# Patient Record
Sex: Female | Born: 1970 | Race: White | Hispanic: No | Marital: Married | Smoking: Never smoker
Health system: Southern US, Community
[De-identification: ages and names within clinical notes are randomized; demographics above are authoritative.]

## PROBLEM LIST (undated history)

## (undated) ENCOUNTER — Emergency Department (HOSPITAL_COMMUNITY): Admission: EM | Payer: Commercial Managed Care - PPO | Source: Home / Self Care

## (undated) DIAGNOSIS — E559 Vitamin D deficiency, unspecified: Secondary | ICD-10-CM

## (undated) DIAGNOSIS — E079 Disorder of thyroid, unspecified: Secondary | ICD-10-CM

## (undated) DIAGNOSIS — K219 Gastro-esophageal reflux disease without esophagitis: Secondary | ICD-10-CM

## (undated) DIAGNOSIS — D649 Anemia, unspecified: Secondary | ICD-10-CM

## (undated) DIAGNOSIS — C801 Malignant (primary) neoplasm, unspecified: Secondary | ICD-10-CM

## (undated) DIAGNOSIS — F419 Anxiety disorder, unspecified: Secondary | ICD-10-CM

## (undated) DIAGNOSIS — K829 Disease of gallbladder, unspecified: Secondary | ICD-10-CM

## (undated) DIAGNOSIS — M2550 Pain in unspecified joint: Secondary | ICD-10-CM

## (undated) DIAGNOSIS — R0602 Shortness of breath: Secondary | ICD-10-CM

## (undated) DIAGNOSIS — M1990 Unspecified osteoarthritis, unspecified site: Secondary | ICD-10-CM

## (undated) DIAGNOSIS — Z8719 Personal history of other diseases of the digestive system: Secondary | ICD-10-CM

## (undated) DIAGNOSIS — Z8489 Family history of other specified conditions: Secondary | ICD-10-CM

## (undated) DIAGNOSIS — M549 Dorsalgia, unspecified: Secondary | ICD-10-CM

## (undated) DIAGNOSIS — F32A Depression, unspecified: Secondary | ICD-10-CM

## (undated) DIAGNOSIS — E039 Hypothyroidism, unspecified: Secondary | ICD-10-CM

## (undated) DIAGNOSIS — R002 Palpitations: Secondary | ICD-10-CM

## (undated) DIAGNOSIS — E538 Deficiency of other specified B group vitamins: Secondary | ICD-10-CM

## (undated) DIAGNOSIS — F329 Major depressive disorder, single episode, unspecified: Secondary | ICD-10-CM

## (undated) DIAGNOSIS — M25569 Pain in unspecified knee: Secondary | ICD-10-CM

## (undated) DIAGNOSIS — I10 Essential (primary) hypertension: Secondary | ICD-10-CM

## (undated) DIAGNOSIS — D510 Vitamin B12 deficiency anemia due to intrinsic factor deficiency: Secondary | ICD-10-CM

## (undated) DIAGNOSIS — K469 Unspecified abdominal hernia without obstruction or gangrene: Secondary | ICD-10-CM

## (undated) HISTORY — DX: Gastro-esophageal reflux disease without esophagitis: K21.9

## (undated) HISTORY — DX: Deficiency of other specified B group vitamins: E53.8

## (undated) HISTORY — DX: Palpitations: R00.2

## (undated) HISTORY — DX: Depression, unspecified: F32.A

## (undated) HISTORY — DX: Vitamin D deficiency, unspecified: E55.9

## (undated) HISTORY — DX: Pain in unspecified joint: M25.50

## (undated) HISTORY — DX: Dorsalgia, unspecified: M54.9

## (undated) HISTORY — DX: Disorder of thyroid, unspecified: E07.9

## (undated) HISTORY — DX: Anemia, unspecified: D64.9

## (undated) HISTORY — DX: Pain in unspecified knee: M25.569

## (undated) HISTORY — DX: Vitamin B12 deficiency anemia due to intrinsic factor deficiency: D51.0

## (undated) HISTORY — DX: Anxiety disorder, unspecified: F41.9

## (undated) HISTORY — DX: Hypothyroidism, unspecified: E03.9

## (undated) HISTORY — PX: OTHER SURGICAL HISTORY: SHX169

## (undated) HISTORY — DX: Shortness of breath: R06.02

## (undated) HISTORY — DX: Disease of gallbladder, unspecified: K82.9

## (undated) HISTORY — DX: Major depressive disorder, single episode, unspecified: F32.9

## (undated) HISTORY — DX: Essential (primary) hypertension: I10

---

## 2006-10-05 ENCOUNTER — Ambulatory Visit (HOSPITAL_COMMUNITY): Admission: RE | Admit: 2006-10-05 | Discharge: 2006-10-05 | Payer: Self-pay | Admitting: Family Medicine

## 2011-02-12 DIAGNOSIS — D649 Anemia, unspecified: Secondary | ICD-10-CM | POA: Insufficient documentation

## 2011-02-12 DIAGNOSIS — F341 Dysthymic disorder: Secondary | ICD-10-CM | POA: Insufficient documentation

## 2011-02-12 DIAGNOSIS — G43909 Migraine, unspecified, not intractable, without status migrainosus: Secondary | ICD-10-CM | POA: Insufficient documentation

## 2011-02-12 DIAGNOSIS — H9319 Tinnitus, unspecified ear: Secondary | ICD-10-CM | POA: Insufficient documentation

## 2011-02-12 DIAGNOSIS — F429 Obsessive-compulsive disorder, unspecified: Secondary | ICD-10-CM | POA: Insufficient documentation

## 2012-10-09 ENCOUNTER — Encounter: Payer: Self-pay | Admitting: Family Medicine

## 2012-10-09 ENCOUNTER — Ambulatory Visit (INDEPENDENT_AMBULATORY_CARE_PROVIDER_SITE_OTHER): Payer: BC Managed Care – PPO | Admitting: Family Medicine

## 2012-10-09 VITALS — BP 124/81 | HR 77 | Temp 97.4°F | Ht 67.5 in | Wt 292.0 lb

## 2012-10-09 DIAGNOSIS — F32A Depression, unspecified: Secondary | ICD-10-CM

## 2012-10-09 DIAGNOSIS — F329 Major depressive disorder, single episode, unspecified: Secondary | ICD-10-CM

## 2012-10-09 DIAGNOSIS — D649 Anemia, unspecified: Secondary | ICD-10-CM

## 2012-10-09 DIAGNOSIS — E039 Hypothyroidism, unspecified: Secondary | ICD-10-CM

## 2012-10-09 DIAGNOSIS — E538 Deficiency of other specified B group vitamins: Secondary | ICD-10-CM

## 2012-10-09 DIAGNOSIS — E559 Vitamin D deficiency, unspecified: Secondary | ICD-10-CM

## 2012-10-09 DIAGNOSIS — M545 Low back pain, unspecified: Secondary | ICD-10-CM

## 2012-10-09 DIAGNOSIS — E669 Obesity, unspecified: Secondary | ICD-10-CM

## 2012-10-09 DIAGNOSIS — E785 Hyperlipidemia, unspecified: Secondary | ICD-10-CM

## 2012-10-09 LAB — THYROID PANEL WITH TSH
Free Thyroxine Index: 2.4 (ref 1.0–3.9)
TSH: 1.941 u[IU]/mL (ref 0.350–4.500)

## 2012-10-09 LAB — IRON AND TIBC
Iron: 29 ug/dL — ABNORMAL LOW (ref 42–145)
UIBC: 331 ug/dL (ref 125–400)

## 2012-10-09 LAB — POCT CBC
Granulocyte percent: 67.5 %G (ref 37–80)
HCT, POC: 38.7 % (ref 37.7–47.9)
MCH, POC: 26.8 pg — AB (ref 27–31.2)
MCV: 77.4 fL — AB (ref 80–97)
RDW, POC: 13.9 %
WBC: 10 10*3/uL (ref 4.6–10.2)

## 2012-10-09 LAB — VITAMIN B12: Vitamin B-12: 678 pg/mL (ref 211–911)

## 2012-10-09 LAB — HEPATIC FUNCTION PANEL: Bilirubin, Direct: <0.1 mg/dL (ref 0.0–0.3)

## 2012-10-09 LAB — BASIC METABOLIC PANEL WITH GFR
BUN: 12 mg/dL (ref 6–23)
Chloride: 103 mEq/L (ref 96–112)
Creat: 0.74 mg/dL (ref 0.50–1.10)
GFR, Est African American: >89 mL/min
Glucose, Bld: 84 mg/dL (ref 70–99)
Potassium: 4.1 mEq/L (ref 3.5–5.3)

## 2012-10-09 NOTE — Patient Instructions (Signed)
Continue aggressive therapeutic lifestyle changes and current medications We will call you with lab work is returned

## 2012-10-09 NOTE — Progress Notes (Signed)
  Subjective:    Patient ID: Diamond Marshall, female    DOB: 09-13-1970, 42 y.o.   MRN: [id number]  HPI Patient notes that she has lost 5 pounds of weight based on our scales. Based on her scales at home she says it's about 10 pounds. She has been on a noted junk food diet for about 20 day, is basically a low carb. See copies of notes from a hematologist in 3-4 years ago in [place]. Increased right knee pain for about a week, no history of any injury,it may be getting some better.   Review of Systems  Constitutional: Positive for fatigue.  HENT: Negative.   Eyes: Negative.   Respiratory: Negative for shortness of breath.   Cardiovascular: Negative.   Gastrointestinal: Negative.   Genitourinary: Negative.   Musculoskeletal: Positive for arthralgias (R knee).  Neurological: Negative.   Psychiatric/Behavioral: The patient is nervous/anxious (slight).        Objective:   Physical Exam BP 124/81  Pulse 77  Temp(Src) 97.4 F (36.3 C) (Oral)  Ht 5' 7.5" (1.715 m)  Wt 292 lb (132.45 kg)  BMI 45.03 kg/m2  The patient appeared well nourished and normally developed, alert and oriented to time and place. Speech, behavior and judgement appear normal. Vital signs as documented.  Head exam is unremarkable. No scleral icterus or pallor noted.  Neck is without jugular venous distension, thyromegally, or carotid bruits. Carotid upstrokes are brisk bilaterally. No cervical adenopathy. Lungs are clear anteriorly and posteriorly to auscultation. Normal respiratory effort. Cardiac exam reveals regular rate and rhythm at 72 per min. First and second heart sounds normal.  No murmurs, rubs or gallops.  Abdominal exam reveals obesity, normal bowl sounds, no masses, no organomegaly and no aortic enlargement.  Extremities are nonedematous and both  pedal pulses are normal. There is tenderness at the right lateral joint line of the right knee. Skin without pallor or jaundice.  Warm and dry, without  rash. Neurologic exam reveals normal deep tendon reflexes and normal sensation.          Assessment & Plan:  1. Anemia  2.  hyperlipidemia  3. anxiety  4. Obesity  5. Hypothyroidism  6. B12 deficiency  7. Low back pain   Lab work to be drawn today  Patient Instructions  Continue aggressive therapeutic lifestyle changes and current medications We will call you with lab work is returned

## 2012-10-10 LAB — VITAMIN D 25 HYDROXY (VIT D DEFICIENCY, FRACTURES): Vit D, 25-Hydroxy: 30 ng/mL (ref 30–89)

## 2012-10-11 LAB — NMR LIPOPROFILE WITH LIPIDS
Cholesterol, Total: 158 mg/dL (ref <200)
HDL Particle Number: 44 umol/L (ref >=30.5)
HDL-C: 42 mg/dL (ref >=40)
LDL (calc): 72 mg/dL (ref <100)
LP-IR Score: 70 — ABNORMAL HIGH (ref <=45)
Triglycerides: 221 mg/dL — ABNORMAL HIGH (ref <150)

## 2012-10-15 ENCOUNTER — Telehealth: Payer: Self-pay | Admitting: *Deleted

## 2012-10-15 NOTE — Telephone Encounter (Signed)
Pt notified of lab results Will call back to schedule appt with Marcelino Duster

## 2012-11-01 ENCOUNTER — Other Ambulatory Visit: Payer: Self-pay | Admitting: Family Medicine

## 2012-12-04 ENCOUNTER — Other Ambulatory Visit: Payer: Self-pay

## 2012-12-04 MED ORDER — LORAZEPAM 0.5 MG PO TABS: 0.5000 mg | ORAL_TABLET | Freq: Two times a day (BID) | ORAL | Status: DC | PRN

## 2012-12-04 NOTE — Telephone Encounter (Signed)
She should only take this if needed and not take it regularly. We will refill it  one time.

## 2012-12-04 NOTE — Telephone Encounter (Signed)
Last seen 10/09/12  DWm  If approved route to nurse to call in and notify patient

## 2012-12-06 NOTE — Telephone Encounter (Signed)
CALLED IN 12/06/12

## 2013-03-27 ENCOUNTER — Other Ambulatory Visit: Payer: Self-pay

## 2013-05-07 ENCOUNTER — Ambulatory Visit: Payer: BC Managed Care – PPO | Admitting: General Practice

## 2013-06-19 ENCOUNTER — Encounter: Payer: BC Managed Care – PPO | Admitting: Family Medicine

## 2013-07-24 ENCOUNTER — Encounter: Payer: Self-pay | Admitting: Family Medicine

## 2013-07-24 ENCOUNTER — Ambulatory Visit (INDEPENDENT_AMBULATORY_CARE_PROVIDER_SITE_OTHER): Payer: BC Managed Care – PPO

## 2013-07-24 ENCOUNTER — Ambulatory Visit (INDEPENDENT_AMBULATORY_CARE_PROVIDER_SITE_OTHER): Payer: BC Managed Care – PPO | Admitting: Family Medicine

## 2013-07-24 VITALS — BP 124/76 | HR 96 | Temp 98.5°F | Ht 67.5 in | Wt 294.0 lb

## 2013-07-24 DIAGNOSIS — Z0000 Encounter for general adult medical examination without abnormal findings: Secondary | ICD-10-CM

## 2013-07-24 DIAGNOSIS — F329 Major depressive disorder, single episode, unspecified: Secondary | ICD-10-CM

## 2013-07-24 DIAGNOSIS — F32A Depression, unspecified: Secondary | ICD-10-CM

## 2013-07-24 DIAGNOSIS — F988 Other specified behavioral and emotional disorders with onset usually occurring in childhood and adolescence: Secondary | ICD-10-CM | POA: Insufficient documentation

## 2013-07-24 DIAGNOSIS — E039 Hypothyroidism, unspecified: Secondary | ICD-10-CM

## 2013-07-24 DIAGNOSIS — F3289 Other specified depressive episodes: Secondary | ICD-10-CM

## 2013-07-24 DIAGNOSIS — D649 Anemia, unspecified: Secondary | ICD-10-CM

## 2013-07-24 MED ORDER — ESCITALOPRAM OXALATE 10 MG PO TABS: 10.0000 mg | ORAL_TABLET | Freq: Every day | ORAL | Status: DC

## 2013-07-24 MED ORDER — CYANOCOBALAMIN 1000 MCG/ML IJ SOLN: 1000.0000 ug | INTRAMUSCULAR | Status: DC

## 2013-07-24 NOTE — Patient Instructions (Addendum)
     Schedule mammogram Continue current medications. Continue good therapeutic lifestyle changes which include good diet and exercise. Fall precautions discussed with patient. If an FOBT was given today- please return it to our front desk. If you are over 43 years old - you may need Prevnar 27 or the adult Pneumonia vaccine.  We arranged an appointment for you to see the clinical pharmacist to discuss weight loss options We will also arrange a visit with you with the nurse practitioner to consider treatment for ADD You will get a chest x-ray and an EKG today Your medications will be refilled, the Lexapro may be changed to another medication by the clinical pharmacist   Returned the FOBT,use saline nose spray and gel and a cool mist humidifier in your arm.  Keep the thermostat lower

## 2013-07-24 NOTE — Progress Notes (Signed)
Subjective:    Patient ID: Diamond Marshall, female    DOB: 1971/02/02, 43 y.o.   MRN: [id number]  HPI Follow up on pernicious anemia, anxiety, and depression. Patient also needs refills on B12 and Lexapro. She would also like to discuss problems with her attention and focus. Patient has concerns regarding her appetite issues. She indicates that it is just hard to stop wanting to eat and eat properly. She has been sick during the past 2-3 months with the flu and viral infections. She has only started feeling better over the past couple of weeks.   Review of Systems  Constitutional: Negative.   HENT: Negative.   Eyes: Negative.   Respiratory: Negative.   Cardiovascular: Negative.   Gastrointestinal: Negative.   Endocrine: Negative.   Genitourinary: Negative.   Musculoskeletal: Negative.   Skin: Negative.   Allergic/Immunologic: Negative.   Neurological: Negative.   Hematological: Negative.   Psychiatric/Behavioral: Negative.        Objective:   Physical Exam  Nursing note and vitals reviewed. Constitutional: She is oriented to person, place, and time. She appears well-developed and well-nourished. No distress.  The patient is pleasant and cooperative and expresses a desire to want to feel better and eat less.  HENT:  Head: Normocephalic and atraumatic.  Right Ear: External ear normal.  Left Ear: External ear normal.  Mouth/Throat: Oropharynx is clear and moist.  Bilateral nasal congestion  Eyes: Conjunctivae and EOM are normal. Pupils are equal, round, and reactive to light. Right eye exhibits no discharge. Left eye exhibits no discharge. No scleral icterus.  Neck: Normal range of motion. Neck supple. No thyromegaly present.  No carotid bruits  Cardiovascular: Normal rate, regular rhythm, normal heart sounds and intact distal pulses.  Exam reveals no gallop and no friction rub.   No murmur heard. At 72 per minute  Pulmonary/Chest: Effort normal and breath sounds normal. No  respiratory distress. She has no wheezes. She has no rales.  Abdominal: Soft. Bowel sounds are normal. She exhibits no mass. There is no tenderness. There is no rebound and no guarding.  Morbid obesity  Musculoskeletal: Normal range of motion. She exhibits no edema and no tenderness.  Lymphadenopathy:    She has no cervical adenopathy.  Neurological: She is alert and oriented to person, place, and time. She has normal reflexes.  Skin: Skin is warm and dry.  Psychiatric: She has a normal mood and affect. Her behavior is normal. Judgment and thought content normal.   BP 124/76  Pulse 96  Temp(Src) 98.5 F (36.9 C) (Oral)  Ht 5' 7.5" (1.715 m)  Wt 294 lb (133.358 kg)  BMI 45.34 kg/m2  EKG: No change from previous EKG  [facility] reading (PRIMARY) by  Dr. Brunilda Payor x-ray -no active disease                                        Assessment & Plan:   1. Depression - escitalopram (LEXAPRO) 10 MG tablet; Take 1 tablet (10 mg total) by mouth daily.  Dispense: 30 tablet; Refill: 5  2. Anemia - cyanocobalamin (,VITAMIN B-12,) 1000 MCG/ML injection; Inject 1 mL (1,000 mcg total) into the muscle every 30 (thirty) days.  Dispense: 6 mL; Refill: 1 - Ferritin - Anemia Profile B  3. Hypothyroid - Thyroid Panel With TSH  4. Health care maintenance - BMP8+EGFR - NMR, lipoprofile - Vit D  25 hydroxy (rtn osteoporosis monitoring) - EKG 12-Lead  5. ADD (attention deficit disorder)  Patient Instructions      Schedule mammogram Continue current medications. Continue good therapeutic lifestyle changes which include good diet and exercise. Fall precautions discussed with patient. If an FOBT was given today- please return it to our front desk. If you are over 19 years old - you may need Prevnar 69 or the adult Pneumonia vaccine.  We arranged an appointment for you to see the clinical pharmacist to discuss weight loss options We will also arrange a visit with you with the nurse  practitioner to consider treatment for ADD You will get a chest x-ray and an EKG today Your medications will be refilled, the Lexapro may be changed to another medication by the clinical pharmacist   Returned the FOBT,use saline nose spray and gel and a cool mist humidifier in your arm.  Keep the thermostat lower     Arrie Senate MD

## 2013-07-25 LAB — ANEMIA PROFILE B
BASOS: 0 %
Basophils Absolute: 0 10*3/uL (ref 0.0–0.2)
Eos: 1 %
Eosinophils Absolute: 0.1 10*3/uL (ref 0.0–0.4)
FOLATE: 13 ng/mL (ref >3.0)
Ferritin: 217 ng/mL — ABNORMAL HIGH (ref 15–150)
HEMATOCRIT: 37.9 % (ref 34.0–46.6)
HEMOGLOBIN: 12.4 g/dL (ref 11.1–15.9)
IRON SATURATION: 9 % — AB (ref 15–55)
IRON: 29 ug/dL — AB (ref 35–155)
Immature Grans (Abs): 0 10*3/uL (ref 0.0–0.1)
Immature Granulocytes: 0 %
LYMPHS ABS: 2.9 10*3/uL (ref 0.7–3.1)
LYMPHS: 29 %
MCH: 26.1 pg — ABNORMAL LOW (ref 26.6–33.0)
MCHC: 32.7 g/dL (ref 31.5–35.7)
MCV: 80 fL (ref 79–97)
MONOCYTES: 5 %
Monocytes Absolute: 0.5 10*3/uL (ref 0.1–0.9)
NEUTROS ABS: 6.5 10*3/uL (ref 1.4–7.0)
Neutrophils Relative %: 65 %
Platelets: 447 10*3/uL — ABNORMAL HIGH (ref 150–379)
RBC: 4.76 x10E6/uL (ref 3.77–5.28)
RDW: 15.7 % — ABNORMAL HIGH (ref 12.3–15.4)
RETIC CT PCT: 1.4 % (ref 0.6–2.6)
TIBC: 340 ug/dL (ref 250–450)
UIBC: 311 ug/dL (ref 150–375)
Vitamin B-12: 1126 pg/mL — ABNORMAL HIGH (ref 211–946)
WBC: 10.1 10*3/uL (ref 3.4–10.8)

## 2013-07-25 LAB — BMP8+EGFR
BUN / CREAT RATIO: 13 (ref 9–23)
BUN: 9 mg/dL (ref 6–24)
CALCIUM: 9.4 mg/dL (ref 8.7–10.2)
CHLORIDE: 99 mmol/L (ref 97–108)
CO2: 22 mmol/L (ref 18–29)
CREATININE: 0.7 mg/dL (ref 0.57–1.00)
GFR calc Af Amer: 124 mL/min/{1.73_m2} (ref >59)
GFR calc non Af Amer: 107 mL/min/{1.73_m2} (ref >59)
Glucose: 103 mg/dL — ABNORMAL HIGH (ref 65–99)
Potassium: 4.7 mmol/L (ref 3.5–5.2)
Sodium: 138 mmol/L (ref 134–144)

## 2013-07-25 LAB — NMR, LIPOPROFILE
Cholesterol: 147 mg/dL (ref <200)
HDL CHOLESTEROL BY NMR: 59 mg/dL (ref >=40)
HDL PARTICLE NUMBER: 44.4 umol/L (ref >=30.5)
LDL Particle Number: 1359 nmol/L — ABNORMAL HIGH (ref <1000)
LDL Size: 19.8 nm — ABNORMAL LOW (ref >20.5)
LDLC SERPL CALC-MCNC: 37 mg/dL (ref <100)
LP-IR SCORE: 74 — AB (ref <=45)
SMALL LDL PARTICLE NUMBER: 963 nmol/L — AB (ref <=527)
Triglycerides by NMR: 255 mg/dL — ABNORMAL HIGH (ref <150)

## 2013-07-25 LAB — THYROID PANEL WITH TSH
FREE THYROXINE INDEX: 1.7 (ref 1.2–4.9)
T3 UPTAKE RATIO: 20 % — AB (ref 24–39)
T4 TOTAL: 8.7 ug/dL (ref 4.5–12.0)
TSH: 2.64 u[IU]/mL (ref 0.450–4.500)

## 2013-07-25 LAB — VITAMIN D 25 HYDROXY (VIT D DEFICIENCY, FRACTURES): VIT D 25 HYDROXY: 17.4 ng/mL — AB (ref 30.0–100.0)

## 2013-07-31 ENCOUNTER — Other Ambulatory Visit: Payer: Self-pay | Admitting: *Deleted

## 2013-07-31 DIAGNOSIS — D649 Anemia, unspecified: Secondary | ICD-10-CM

## 2013-07-31 MED ORDER — VITAMIN D (ERGOCALCIFEROL) 1.25 MG (50000 UNIT) PO CAPS: 50000.0000 [IU] | ORAL_CAPSULE | ORAL | Status: DC

## 2013-07-31 MED ORDER — INTEGRA 62.5-62.5-40-3 MG PO CAPS: 1.0000 | ORAL_CAPSULE | Freq: Every day | ORAL | Status: DC

## 2013-08-07 ENCOUNTER — Ambulatory Visit (INDEPENDENT_AMBULATORY_CARE_PROVIDER_SITE_OTHER): Payer: BC Managed Care – PPO | Admitting: Pharmacist

## 2013-08-07 DIAGNOSIS — E669 Obesity, unspecified: Secondary | ICD-10-CM

## 2013-08-07 DIAGNOSIS — E781 Pure hyperglyceridemia: Secondary | ICD-10-CM | POA: Insufficient documentation

## 2013-08-07 DIAGNOSIS — R7309 Other abnormal glucose: Secondary | ICD-10-CM

## 2013-08-07 DIAGNOSIS — E8881 Metabolic syndrome: Secondary | ICD-10-CM

## 2013-08-07 NOTE — Progress Notes (Deleted)
   Subjective:    Patient ID: Diamond Marshall, female    DOB: 02-08-1971, 43 y.o.   MRN: [id number]  HPI    Review of Systems     Objective:   Physical Exam        Assessment & Plan:

## 2013-08-07 NOTE — Progress Notes (Signed)
Subjective:    Diamond Marshall is a 43 y.o. female who presents for evaluation of obesity. She has noted a weight gain of approximately 50 pounds over the last 5 years. There is a positive family history for obesity in daughter and husband. She feels ideal weight is 200 pounds.  History of eating disorders: none. Previous treatments for obesity include: self-directed dieting and Weight Watchers. Associated medical conditions: depression, hyperlipidemia and metabolic syndrome. Associated medications: none. Cardiovascular risk factors besides obesity: dyslipidemia, family history of premature cardiovascular disease, obesity (BMI >= 30 kg/m2) and sedentary lifestyle. Patient reports that she has symptoms of low BG at times and then she gets very hungry.    The following portions of the patient's history were reviewed and updated as appropriate: allergies, current medications, past family history, past medical history, past social history, past surgical history and problem list.  Review of Systems Constitutional: negative for anorexia, chills, fatigue, fevers, night sweats and sweats Behavioral/Psych: positive for depression, irritability and mood swings, negative for abusive relationship, aggressive behavior, decreased appetite and fatigue Endocrine: positive for elevated BG and Triglycerides although patient stated most recent labs were non fasting, negative for diabetic symptoms including blurry vision, polydipsia, polyphagia, polyuria, poor wound healing and weight loss and fertility problems    Objective:        Lab Review  Office Visit on 07/24/2013  Component Date Value  . Glucose 07/24/2013 103*  . BUN 07/24/2013 9   . Creatinine, Ser 07/24/2013 0.70   . GFR calc non Af Amer 07/24/2013 107   . GFR calc Af Amer 07/24/2013 124   . BUN/Creatinine Ratio 07/24/2013 13   . Sodium 07/24/2013 138   . Potassium 07/24/2013 4.7   . Chloride 07/24/2013 99   . CO2 07/24/2013 22   . Calcium  07/24/2013 9.4   . LDL Particle Number 07/24/2013 [id number]*  . LDLC SERPL CALC-MCNC 07/24/2013 37   . HDL Cholesterol by NMR 07/24/2013 59   . Triglycerides by NMR 07/24/2013 255*  . Cholesterol 07/24/2013 147   . HDL Particle Number 07/24/2013 44.4   . Small LDL Particle Number 07/24/2013 963*  . LDL Size 07/24/2013 19.8*  . LP-IR Score 07/24/2013 74*  . Vit D, 25-Hydroxy 07/24/2013 17.4*  . TIBC 07/24/2013 340   . UIBC 07/24/2013 311   . Iron 07/24/2013 29*  . Iron Saturation 07/24/2013 9*  . Ferritin 07/24/2013 217*  . Vitamin B-12 07/24/2013 1126*  . Folate 07/24/2013 13.0   . WBC 07/24/2013 10.1   . RBC 07/24/2013 4.76   . Hemoglobin 07/24/2013 12.4   . HCT 07/24/2013 37.9   . MCV 07/24/2013 80   . MCH 07/24/2013 26.1*  . MCHC 07/24/2013 32.7   . RDW 07/24/2013 15.7*  . Platelets 07/24/2013 447*  . Neutrophils Relative % 07/24/2013 65   . Lymphs 07/24/2013 29   . Monocytes 07/24/2013 5   . Eos 07/24/2013 1   . Basos 07/24/2013 0   . Neutrophils Absolute 07/24/2013 6.5   . Lymphocytes Absolute 07/24/2013 2.9   . Monocytes Absolute 07/24/2013 0.5   . Eosinophils Absolute 07/24/2013 0.1   . Basophils Absolute 07/24/2013 0.0   . Immature Granulocytes 07/24/2013 0   . Immature Grans (Abs) 07/24/2013 0.0   . Retic Ct Pct 07/24/2013 1.4   . TSH 07/24/2013 2.640   . T4, Total 07/24/2013 8.7   . T3 Uptake Ratio 07/24/2013 20*  . Free Thyroxine Index 07/24/2013 1.7  Assessment:    Obesity with BMI and comorbidities as noted above.  Possible insulin resistance   Plan:    1. Discussed proper diet (low fat, low sodium, high fiber) with patient.   2. Discussed need for regular exercise (3 times per week, 20 minutes per session) with patient.   3. Start metformin XR 500mg  1 tablet daily with food for 7 days, then increase to 2 tablets daily.  We discussed Contrave and other weight loss medications and patient is not interested in starting at this time. 4.  Also  discussed weight loss surgery options 5. Follow up in 4 weeks and as needed.    Cherre Robins, PharmD, CPP

## 2013-08-07 NOTE — Patient Instructions (Signed)
Metabolic Syndrome, Adult Metabolic syndrome descibes a group of risk factors for heart disease and diabetes. This syndrome has other names including Insulin Resistance Syndrome. The more risk factors you have, the higher your risk of having a heart attack, stroke, or developing diabetes. These risk factors include:  High blood sugar.  High blood triglyceride (a fat found in the blood) level.  High blood pressure.  Abdominal obesity (your extra weight is around your waist instead of your hips).  Low levels of high-density lipoprotein, HDL (good blood cholesterol). If you have any three of these risk factors, you have metabolic syndrome. If you have even one of these factors, you should make lifestyle changes to improve your health in order to prevent serious health diseases.  In people with metabolic syndrome, the cells do not respond properly to insulin. This can lead to high levels of glucose in the blood, which can interfere with normal body processes. Eventually, this can cause high blood pressure and higher fat levels in the blood, and inflammation of your blood vessels. The result can be heart disease and stroke.  CAUSES   Eating a diet rich in calories and saturated fat.  Too little physical activity.  Being overweight. Other underlying causes are:  Family history (genetics).  Ethnicity (South Asians are at a higher risk).  Older age (your chances of developing metabolic syndrome are higher as you grow older).  Insulin resistance. SYMPTOMS  By itself, metabolic syndrome has no symptoms. However, you might have symptoms of diabetes (high blood sugar) or high blood pressure, such as:  Increased thirst, urination, and tiredness.  Dizzy spells.  Dull headaches that are unusual for you.  Blurred vision.  Nosebleeds. DIAGNOSIS  Your caregiver may make a diagnosis of metabolic syndrome if you have at least three of these factors:  If you are overweight mostly around the  waist. This means a waistline greater than 40" in men and more than 35" in women. The waistline limits are 31 to 35 inches for women and 37 to 39 inches for men. In those who have certain genetic risk factors, such as having a family history of diabetes or being of Asian descent.  If you have a blood pressure of 130/85 mm Hg or more, or if you are being treated for high blood pressure.  If your blood triglyceride level is 150 mg/dL or more, or you are being treated for high levels of triglyceride.  If the level of HDL in your blood is below 40 mg/dL in men, less than 50 mg/dL in women, or you are receiving treatment for low levels of HDL.  If the level of sugar in your blood is high with fasting blood sugar level of 110 mg/dL or more, or you are under treatment for diabetes. TREATMENT  Your caregiver may have you make lifestyle changes, which may include:  Exercise.  Losing weight.  Maintaining a healthy diet.  Quitting smoking. The lifestyle changes listed above are key in reducing your risk for heart disease and stroke. Medicines may also be prescribed to help your body respond to insulin better and to reduce your blood pressure and blood fat levels. Aspirin may be recommended to reduce risks of heart disease or stroke.  HOME CARE INSTRUCTIONS   Exercise.  Measure your waist at regular intervals just above the hipbones after you have breathed out.  Maintain a healthy diet.  Eat fruits, such as apples, oranges, and pears.  Eat vegetables.  Eat legumes, such as kidney  beans, peas, and lentils.  Eat food rich in soluble fiber, such as whole grain cereal, oatmeal, and oat bran.  Use olive or safflower oils and avoid saturated fats.  Eat nuts.  Limit the amount of salt you eat or add to food.  Limit the amount of alcohol you drink.  Include fish in your diet, if possible.  Stop smoking if you are a smoker.  Maintain regular follow-up appointments.  Follow your  caregiver's advice. SEEK MEDICAL CARE IF:   You feel very tired or fatigued.  You develop excessive thirst.  You pass large quantities of urine.  You are putting on weight around your waist rather than losing weight.  You develop headaches over and over again.  You have off-and-on dizzy spells. SEEK IMMEDIATE MEDICAL CARE IF:   You develop nosebleeds.  You develop sudden blurred vision.  You develop sudden dizzy spells.  You develop chest pains, trouble breathing, or feel an abnormal or irregular heart beat.  You have a fainting episode.  You develop any sudden trouble speaking and/or swallowing.  You develop sudden weakness in one arm and/or one leg. MAKE SURE YOU:   Understand these instructions.  Will watch your condition.  Will get help right away if you are not doing well or get worse. Document Released: 08/15/2007 Document Revised: 07/31/2011 Document Reviewed: 08/15/2007 [facility] Patient Information 2014 [place], [place].

## 2013-08-18 ENCOUNTER — Telehealth: Payer: Self-pay | Admitting: Nurse Practitioner

## 2013-08-18 NOTE — Telephone Encounter (Signed)
Called patient back - asked about recent diet and she reported over the weekend eating out more than usual.  I think swelling / fluid retention might be related to increased salt intake.  Patient to continue metformin and let me know if swelling continues.

## 2013-08-20 ENCOUNTER — Telehealth: Payer: Self-pay | Admitting: Pharmacist

## 2013-08-21 NOTE — Telephone Encounter (Signed)
Since metformin is XR is to very common to find the shell or a ghost tablet.  You are still absorbing medication.

## 2013-08-27 ENCOUNTER — Ambulatory Visit: Payer: BC Managed Care – PPO | Admitting: Nurse Practitioner

## 2013-09-01 ENCOUNTER — Ambulatory Visit: Payer: BC Managed Care – PPO | Admitting: Nurse Practitioner

## 2013-09-17 ENCOUNTER — Encounter: Payer: Self-pay | Admitting: *Deleted

## 2013-09-29 ENCOUNTER — Other Ambulatory Visit: Payer: Self-pay | Admitting: Family Medicine

## 2013-10-01 ENCOUNTER — Ambulatory Visit: Payer: BC Managed Care – PPO | Admitting: Nurse Practitioner

## 2013-10-02 ENCOUNTER — Ambulatory Visit: Payer: BC Managed Care – PPO | Admitting: Nurse Practitioner

## 2013-10-20 ENCOUNTER — Telehealth: Payer: Self-pay | Admitting: Pharmacist

## 2013-10-22 NOTE — Telephone Encounter (Signed)
Recommend discontinue metformin.  Patient's daughter was just diagnosed with PCOS and the family has changed diet.  Patient wants to try diet for 4-6 weeks and then will come in for labs.

## 2013-11-17 ENCOUNTER — Telehealth: Payer: Self-pay | Admitting: Family Medicine

## 2013-11-17 NOTE — Telephone Encounter (Signed)
Spoke with patient and she has rinsed her eye and used visine. Patient notified to watch it and if it starts to get red, swelling or irritated to please call office

## 2013-11-27 ENCOUNTER — Telehealth: Payer: Self-pay | Admitting: Family Medicine

## 2013-11-27 NOTE — Telephone Encounter (Signed)
The heart rate is supposed to rise with exercise that is normal. Gait she had any chest discomfort or shortness of breath?

## 2013-11-27 NOTE — Telephone Encounter (Signed)
Patient aware. States no sob or chest pain

## 2014-02-19 ENCOUNTER — Other Ambulatory Visit: Payer: Self-pay | Admitting: Family Medicine

## 2014-03-06 ENCOUNTER — Other Ambulatory Visit: Payer: Self-pay

## 2014-05-18 ENCOUNTER — Encounter: Payer: Self-pay | Admitting: Physician Assistant

## 2014-05-18 ENCOUNTER — Ambulatory Visit (INDEPENDENT_AMBULATORY_CARE_PROVIDER_SITE_OTHER): Payer: BC Managed Care – PPO | Admitting: Physician Assistant

## 2014-05-18 VITALS — BP 147/97 | HR 92 | Temp 98.4°F | Ht 67.5 in | Wt 292.2 lb

## 2014-05-18 DIAGNOSIS — M25511 Pain in right shoulder: Secondary | ICD-10-CM

## 2014-05-18 MED ORDER — MELOXICAM 15 MG PO TABS: 15.0000 mg | ORAL_TABLET | Freq: Every day | ORAL | Status: DC

## 2014-05-18 NOTE — Progress Notes (Signed)
Subjective:     Patient ID: Diamond Marshall, female   DOB: [birth year], 43 y.o.   MRN: [id number]  HPI Pt with R shoulder pain x 1 week She denies any injury to the shoulder Sx will radiate to the mid bicep area No OTC meds for sx No hx of same  Review of Systems     Objective:   Physical Exam NAD No ecchy/edema/rashes to the R shoulder +TTP of the superior and lateral shoulder No TTP of the ant/post shoulder No sx with internal/ext rotation + Sx with rest ant and lateral abduction Good strength distal Good grip strength Sensory intact    Assessment:     Right Shoulder pain    Plan:     Heat/Ice Mobic 15mg  daily x 2 weeks Hold any OTC NSAIDS ROM exercises reviewed If sx cont f/u for Xray and injection

## 2014-05-18 NOTE — Patient Instructions (Signed)

## 2014-06-17 ENCOUNTER — Telehealth: Payer: Self-pay | Admitting: Family Medicine

## 2014-06-17 DIAGNOSIS — D508 Other iron deficiency anemias: Secondary | ICD-10-CM

## 2014-06-17 DIAGNOSIS — E785 Hyperlipidemia, unspecified: Secondary | ICD-10-CM

## 2014-06-17 DIAGNOSIS — E559 Vitamin D deficiency, unspecified: Secondary | ICD-10-CM

## 2014-06-17 NOTE — Telephone Encounter (Signed)
Order entered in [place]  Pt notified

## 2014-06-18 ENCOUNTER — Other Ambulatory Visit: Payer: Self-pay | Admitting: *Deleted

## 2014-06-18 ENCOUNTER — Telehealth: Payer: Self-pay | Admitting: Family Medicine

## 2014-06-18 DIAGNOSIS — D649 Anemia, unspecified: Secondary | ICD-10-CM

## 2014-06-18 DIAGNOSIS — D508 Other iron deficiency anemias: Secondary | ICD-10-CM

## 2014-06-18 NOTE — Telephone Encounter (Signed)
This is okay to do that referral or even it to anything and so any history will become part of our records

## 2014-06-18 NOTE — Telephone Encounter (Signed)
Please review and advise.

## 2014-07-01 ENCOUNTER — Other Ambulatory Visit: Payer: Self-pay

## 2014-07-08 ENCOUNTER — Encounter: Payer: Self-pay | Admitting: Family Medicine

## 2014-07-09 ENCOUNTER — Encounter (HOSPITAL_COMMUNITY): Payer: Self-pay | Admitting: Hematology & Oncology

## 2014-07-09 ENCOUNTER — Encounter (HOSPITAL_COMMUNITY): Payer: BLUE CROSS/BLUE SHIELD | Attending: Hematology & Oncology | Admitting: Hematology & Oncology

## 2014-07-09 VITALS — BP 157/81 | HR 94 | Temp 98.8°F | Resp 16 | Ht 62.75 in | Wt 294.8 lb

## 2014-07-09 DIAGNOSIS — D508 Other iron deficiency anemias: Secondary | ICD-10-CM | POA: Diagnosis not present

## 2014-07-09 DIAGNOSIS — E039 Hypothyroidism, unspecified: Secondary | ICD-10-CM

## 2014-07-09 DIAGNOSIS — E538 Deficiency of other specified B group vitamins: Secondary | ICD-10-CM | POA: Insufficient documentation

## 2014-07-09 DIAGNOSIS — E611 Iron deficiency: Secondary | ICD-10-CM

## 2014-07-09 DIAGNOSIS — Z8041 Family history of malignant neoplasm of ovary: Secondary | ICD-10-CM

## 2014-07-09 LAB — CBC WITH DIFFERENTIAL/PLATELET
BASOS ABS: 0 10*3/uL (ref 0.0–0.1)
Basophils Relative: 0 % (ref 0–1)
EOS ABS: 0.7 10*3/uL (ref 0.0–0.7)
Eosinophils Relative: 7 % — ABNORMAL HIGH (ref 0–5)
HCT: 39.8 % (ref 36.0–46.0)
Hemoglobin: 12.5 g/dL (ref 12.0–15.0)
LYMPHS ABS: 2.9 10*3/uL (ref 0.7–4.0)
LYMPHS PCT: 28 % (ref 12–46)
MCH: 25.5 pg — ABNORMAL LOW (ref 26.0–34.0)
MCHC: 31.4 g/dL (ref 30.0–36.0)
MCV: 81.1 fL (ref 78.0–100.0)
MONO ABS: 0.5 10*3/uL (ref 0.1–1.0)
MONOS PCT: 5 % (ref 3–12)
NEUTROS ABS: 6.1 10*3/uL (ref 1.7–7.7)
NEUTROS PCT: 60 % (ref 43–77)
Platelets: 254 10*3/uL (ref 150–400)
RBC: 4.91 MIL/uL (ref 3.87–5.11)
RDW: 14.9 % (ref 11.5–15.5)
WBC: 10.2 10*3/uL (ref 4.0–10.5)

## 2014-07-09 LAB — COMPREHENSIVE METABOLIC PANEL
ALBUMIN: 3.5 g/dL (ref 3.5–5.2)
ALT: 14 U/L (ref 0–35)
AST: 18 U/L (ref 0–37)
Alkaline Phosphatase: 98 U/L (ref 39–117)
Anion gap: 5 (ref 5–15)
BILIRUBIN TOTAL: 0.2 mg/dL — AB (ref 0.3–1.2)
BUN: 12 mg/dL (ref 6–23)
CO2: 25 mmol/L (ref 19–32)
Calcium: 8.4 mg/dL (ref 8.4–10.5)
Chloride: 108 mmol/L (ref 96–112)
Creatinine, Ser: 0.8 mg/dL (ref 0.50–1.10)
GFR calc Af Amer: >90 mL/min (ref >90)
GFR, EST NON AFRICAN AMERICAN: 89 mL/min — AB (ref >90)
Glucose, Bld: 86 mg/dL (ref 70–99)
POTASSIUM: 3.6 mmol/L (ref 3.5–5.1)
SODIUM: 138 mmol/L (ref 135–145)
Total Protein: 7.8 g/dL (ref 6.0–8.3)

## 2014-07-09 LAB — SEDIMENTATION RATE: SED RATE: 51 mm/h — AB (ref 0–22)

## 2014-07-09 NOTE — Progress Notes (Signed)
Diamond Marshall presented for [employer]. Labs per MD order drawn via Peripheral Line 23 gauge needle inserted in Right AC and left hand - blood clotted with first venipuncture and second venipuncture performed.  Good blood return present. Procedure without incident.  Needle removed intact. Patient tolerated procedure well.

## 2014-07-09 NOTE — Patient Instructions (Signed)
[place] at [facility] Discharge Instructions  RECOMMENDATIONS MADE BY THE CONSULTANT AND ANY TEST RESULTS WILL BE SENT TO YOUR REFERRING PHYSICIAN.  Exam and discussion by Dr. Whitney Muse.  We will check some blood work and will let you know if you need an iron infusion. Report increased fatigue or other concerns.  Follow-up in 4 months  Thank you for choosing [place] at [facility] to provide your oncology and hematology care.  To afford each patient quality time with our provider, please arrive at least 15 minutes before your scheduled appointment time.    You need to re-schedule your appointment should you arrive 10 or more minutes late.  We strive to give you quality time with our providers, and arriving late affects you and other patients whose appointments are after yours.  Also, if you no show three or more times for appointments you may be dismissed from the clinic at the providers discretion.     Again, thank you for choosing [facility].  Our hope is that these requests will decrease the amount of time that you wait before being seen by our physicians.       _____________________________________________________________  Should you have questions after your visit to [facility], please contact our office at [phone] between the hours of 8:30 a.m. and 4:30 p.m.  Voicemails left after 4:30 p.m. will not be returned until the following business day.  For prescription refill requests, have your pharmacy contact our office.

## 2014-07-10 LAB — FERRITIN: FERRITIN: 115 ng/mL (ref 10–291)

## 2014-07-10 LAB — IRON AND TIBC
IRON: 25 ug/dL — AB (ref 42–145)
Saturation Ratios: 7 % — ABNORMAL LOW (ref 20–55)
TIBC: 374 ug/dL (ref 250–470)
UIBC: 349 ug/dL (ref 125–400)

## 2014-07-10 LAB — VITAMIN B12: Vitamin B-12: 816 pg/mL (ref 211–911)

## 2014-07-10 LAB — FOLATE: Folate: >20 ng/mL

## 2014-07-10 LAB — THYROID PEROXIDASE ANTIBODY

## 2014-07-15 ENCOUNTER — Telehealth (HOSPITAL_COMMUNITY): Payer: Self-pay | Admitting: *Deleted

## 2014-07-15 NOTE — Telephone Encounter (Signed)
Patient called to say that she now has a bad head cold and wanted [place] to know in case her labs look strange.

## 2014-07-15 NOTE — Telephone Encounter (Signed)
Advise patients labs are ok. B12 level is good, folate is good. CBC and CMP are normal. Iron levels are currently ok. Dr.P

## 2014-07-23 ENCOUNTER — Telehealth (HOSPITAL_COMMUNITY): Payer: Self-pay

## 2014-07-23 NOTE — Telephone Encounter (Signed)
-----   Message from Epifanio Lesches sent at 07/22/2014  2:39 PM EST ----- Wants to know the results of her labs

## 2014-07-23 NOTE — Telephone Encounter (Signed)
Patient notifed labs were ok.

## 2014-07-23 NOTE — Telephone Encounter (Signed)
Discussed with patient again today.

## 2014-07-23 NOTE — Telephone Encounter (Signed)
Would like lab results

## 2014-07-23 NOTE — Telephone Encounter (Signed)
Patient already advised of lab results at end of Feb. Dr.P

## 2014-08-03 NOTE — Progress Notes (Signed)
[facility] CONSULT NOTE  Patient Care Team: Lodema Pilot, PA-C as PCP - General (Physician Assistant)  CHIEF COMPLAINTS/PURPOSE OF CONSULTATION:  History of iron deficiency anemia requiring IV iron Pernicious anemia  HISTORY OF PRESENTING ILLNESS:  Diamond Marshall 44 y.o. female is here because of a history of iron deficiency and B12 deficiency. She currently self administers B12 at home every 15 days. She has tried oral iron in the past for her iron deficiency but states she cannot be compliant with it because of severe constipation. She notes it also causes her nausea. She also has a history of thyroid disease and is on levothyroxine 50 g daily. She takes ocella which has lightened her menstrual cycles, she notes she had a maternal aunt died from ovarian cancer. She states she also knows that birth control pills decrease ovarian cancer risk.  She is here to establish with hematology. Laboratory studies available from 2012 show a serum iron of 27 ug/dl (42-145). Iron saturation of 8%. Mild thrombocytosis at 430K.  ESR at 44 mm/hr, RA less than 20 and ANA was negative.   MEDICAL HISTORY:  Past Medical History  Diagnosis Date  . Anxiety   . GERD (gastroesophageal reflux disease)   . Thyroid disease     hypothyroidism  . Depression   . Anemia     b12 deficiency    SURGICAL HISTORY: Past Surgical History  Procedure Laterality Date  . Cesarean section  1999  . Dilatation and currettagement      for miscarriage 18 years ago    SOCIAL HISTORY: History   Social History  . Marital Status: Married    Spouse Name: N/A  . Number of Children: N/A  . Years of Education: N/A   Occupational History  . Not on file.   Social History Main Topics  . Smoking status: Never Smoker   . Smokeless tobacco: Never Used  . Alcohol Use: No  . Drug Use: No  . Sexual Activity: Yes    Birth Control/ Protection: Pill   Other Topics Concern  . Not on file   Social  History Narrative   She has been married for 21 years and has a daughter aged 89. 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 She is a nonsmoker and does not drink alcohol.  FAMILY HISTORY: Family History  Problem Relation Age of Onset  . Hypertension Mother   . CVA Father     hemorrhagic   indicated that her mother is alive. She indicated that her father is deceased.   Her mother is alive at the age of 62 and healthy. She has a stepfather that is living. Her birth father died at the age of 44 from a cerebral hemorrhage. She has a stepbrother. She notes her mother has thyroid disease and a maternal cousin has pernicious anemia.  ALLERGIES:  is allergic to influenza vaccines and septra.  MEDICATIONS:  Current Outpatient Prescriptions  Medication Sig Dispense Refill  . cyanocobalamin (,VITAMIN B-12,) 1000 MCG/ML injection Inject 1 mL (1,000 mcg total) into the muscle every 30 (thirty) days. (Patient taking differently: Inject 1,000 mcg into the muscle every 30 (thirty) days. Every 15 days) 6 mL 1  . escitalopram (LEXAPRO) 10 MG tablet Take 1 tablet (10 mg total) by mouth daily. 30 tablet 5  . Fe Fum-FePoly-Vit C-Vit B3 (INTEGRA) 62.5-62.5-40-3 MG CAPS Take 1 capsule by mouth daily. 30 capsule 1  . folic acid (FOLVITE) 366 MCG tablet  Take 800 mcg by mouth daily.    Marland Kitchen ibuprofen (ADVIL,MOTRIN) 200 MG tablet Take 400 mg by mouth every 6 (six) hours as needed for moderate pain.    Marland Kitchen levothyroxine (SYNTHROID, LEVOTHROID) 50 MCG tablet TAKE 1 TABLET ONCE A DAY 90 tablet 3  . LORazepam (ATIVAN) 0.5 MG tablet Take 1 tablet (0.5 mg total) by mouth 2 (two) times daily as needed for anxiety. 60 tablet 0  . Melatonin 3 MG CAPS Take 1 capsule by mouth at bedtime as needed.    . OCELLA 3-0.03 MG tablet Take 1 tablet by mouth daily.     Marland Kitchen UNABLE TO FIND Take 2 tablets by mouth daily. Med Name: Prenatal DHA & Folic Acid gummies    . meloxicam (MOBIC) 15 MG tablet Take 1 tablet (15 mg  total) by mouth daily. (Patient not taking: Reported on 07/09/2014) 15 tablet 0   No current facility-administered medications for this visit.    Review of Systems  Constitutional: Positive for malaise/fatigue. Negative for fever, chills and weight loss.  HENT: Negative for congestion, hearing loss, nosebleeds, sore throat and tinnitus.   Eyes: Negative for blurred vision, double vision, pain and discharge.       Reading glasses  Respiratory: Negative for cough, hemoptysis, sputum production, shortness of breath and wheezing.   Cardiovascular: Negative for chest pain, palpitations, claudication, leg swelling and PND.  Gastrointestinal: Positive for constipation. Negative for heartburn, nausea, vomiting, abdominal pain, diarrhea, blood in stool and melena.       She notes constipation is more of a problem on oral iron  Genitourinary: Negative for dysuria, urgency, frequency and hematuria.  Musculoskeletal: Negative for myalgias, joint pain and falls.  Skin: Negative for itching and rash.       Dry skin   Neurological: Negative for dizziness, tingling, tremors, sensory change, speech change, focal weakness, seizures, loss of consciousness, weakness and headaches.       Restless leg syndrome   Endo/Heme/Allergies: Does not bruise/bleed easily.  Psychiatric/Behavioral: Negative for depression, suicidal ideas, memory loss and substance abuse. The patient is not nervous/anxious and does not have insomnia.     PHYSICAL EXAMINATION: ECOG PERFORMANCE STATUS: 0 - Asymptomatic  Filed Vitals:   07/09/14 1552  BP: 157/81  Pulse: 94  Temp: 98.8 F (37.1 C)  Resp: 16   Filed Weights   07/09/14 1552  Weight: 294 lb 12.8 oz (133.72 kg)     Physical Exam  Constitutional: She is oriented to person, place, and time and well-developed, well-nourished, and in no distress.  Pleasant well groomed, obese  HENT:  Head: Normocephalic and atraumatic.  Nose: Nose normal.  Mouth/Throat: Oropharynx  is clear and moist. No oropharyngeal exudate.  Eyes: Conjunctivae and EOM are normal. Pupils are equal, round, and reactive to light. Right eye exhibits no discharge. Left eye exhibits no discharge. No scleral icterus.  Neck: Normal range of motion. Neck supple. No tracheal deviation present. No thyromegaly present.  Cardiovascular: Normal rate, regular rhythm and normal heart sounds.  Exam reveals no gallop and no friction rub.   No murmur heard. Pulmonary/Chest: Effort normal and breath sounds normal. She has no wheezes. She has no rales.  Abdominal: Soft. Bowel sounds are normal. She exhibits no distension and no mass. There is no tenderness. There is no rebound and no guarding.  Musculoskeletal: Normal range of motion. She exhibits no edema.  Lymphadenopathy:    She has no cervical adenopathy.  Neurological: She is alert and oriented to  person, place, and time. She has normal reflexes. No cranial nerve deficit. Gait normal. Coordination normal.  Skin: Skin is warm and dry. No rash noted.  Psychiatric: Mood, memory, affect and judgment normal.  Nursing note and vitals reviewed.    LABORATORY DATA:  I have reviewed the data as listed Lab Results  Component Value Date   WBC 10.2 07/09/2014   HGB 12.5 07/09/2014   HCT 39.8 07/09/2014   MCV 81.1 07/09/2014   PLT 254 07/09/2014     Chemistry      Component Value Date/Time   NA 138 07/09/2014 1627   NA 138 07/24/2013 1111   K 3.6 07/09/2014 1627   CL 108 07/09/2014 1627   CO2 25 07/09/2014 1627   BUN 12 07/09/2014 1627   BUN 9 07/24/2013 1111   CREATININE 0.80 07/09/2014 1627   CREATININE 0.74 10/09/2012 1537      Component Value Date/Time   CALCIUM 8.4 07/09/2014 1627   ALKPHOS 98 07/09/2014 1627   AST 18 07/09/2014 1627   ALT 14 07/09/2014 1627   BILITOT 0.2* 07/09/2014 1627        ASSESSMENT & PLAN:   B12 and iron deficiency  I do not have any documentation of her prior B12 levels but since she has been on B12  for some time I have currently advised her to continue. We will check a M62 and folic acid level today. I do not have documentation of low serum iron and would recommend repeating her ferritin and serum iron studies today especially since she is intolerant of oral iron. We will also check a CBC and methylmalonic acid. She does not think she has had her thyroid function checked in some time and I have recommended a TSH and free T4 as well. I will keep her apprised of the levels of her laboratory studies when they become available. If she needs any adjustments in her B12 dosing we will let her know. If she needs any iron replacement we will let her know as well. We will tentatively schedule her for 3 month follow-up.  Orders Placed This Encounter  Procedures  . CBC with Differential  . Comprehensive metabolic panel  . Sedimentation rate  . Iron and TIBC  . Ferritin  . Vitamin B12  . Folate  . Thyroid Peroxidase Antibody    All questions were answered. The patient knows to call the clinic with any problems, questions or concerns.  This note was electronically signed.    Molli Hazard, MD MD 08/03/2014 11:58 AM

## 2014-08-04 ENCOUNTER — Telehealth (HOSPITAL_COMMUNITY): Payer: Self-pay | Admitting: *Deleted

## 2014-08-04 ENCOUNTER — Other Ambulatory Visit: Payer: Self-pay | Admitting: Family Medicine

## 2014-08-04 ENCOUNTER — Other Ambulatory Visit (HOSPITAL_COMMUNITY): Payer: Self-pay | Admitting: Oncology

## 2014-08-04 DIAGNOSIS — D518 Other vitamin B12 deficiency anemias: Secondary | ICD-10-CM

## 2014-08-04 MED ORDER — CYANOCOBALAMIN 1000 MCG/ML IJ SOLN: INTRAMUSCULAR | Status: DC

## 2014-08-10 ENCOUNTER — Ambulatory Visit (INDEPENDENT_AMBULATORY_CARE_PROVIDER_SITE_OTHER): Payer: BLUE CROSS/BLUE SHIELD | Admitting: Family Medicine

## 2014-08-10 ENCOUNTER — Ambulatory Visit (INDEPENDENT_AMBULATORY_CARE_PROVIDER_SITE_OTHER): Payer: BLUE CROSS/BLUE SHIELD

## 2014-08-10 ENCOUNTER — Encounter: Payer: Self-pay | Admitting: Family Medicine

## 2014-08-10 VITALS — BP 145/92 | HR 92 | Temp 98.5°F | Ht 62.75 in | Wt 295.8 lb

## 2014-08-10 DIAGNOSIS — M25511 Pain in right shoulder: Secondary | ICD-10-CM | POA: Diagnosis not present

## 2014-08-10 DIAGNOSIS — M12511 Traumatic arthropathy, right shoulder: Secondary | ICD-10-CM | POA: Diagnosis not present

## 2014-08-10 DIAGNOSIS — M12811 Other specific arthropathies, not elsewhere classified, right shoulder: Secondary | ICD-10-CM

## 2014-08-10 MED ORDER — PREDNISONE 10 MG PO TABS: ORAL_TABLET | ORAL | Status: DC

## 2014-08-10 NOTE — Progress Notes (Signed)
Subjective:  Patient ID: Diamond Marshall, female    DOB: 02/21/1971  Age: 44 y.o. MRN: [id number]  CC: Arm Pain   HPI Diamond Marshall presents for  recurrent pain at the right upper arm. Pain is getting more severe. It has been present for several weeks. There is no known injury. She has restricted range of motion.  History Diamond Marshall has a past medical history of Anxiety; GERD (gastroesophageal reflux disease); Thyroid disease; Depression; and Anemia.  She has past surgical history that includes Cesarean section (1999) and Dilatation and currettagement.   Her family history includes CVA in her father; Hypertension in her mother.She reports that she has never smoked. She has never used smokeless tobacco. She reports that she does not drink alcohol or use illicit drugs.  Current Outpatient Prescriptions on File Prior to Visit  Medication Sig Dispense Refill  . cyanocobalamin (,VITAMIN B-12,) 1000 MCG/ML injection Take 1000 mcg every 15 days. 6 mL 1  . escitalopram (LEXAPRO) 10 MG tablet Take 1 tablet (10 mg total) by mouth daily. 30 tablet 5  . Fe Fum-FePoly-Vit C-Vit B3 (INTEGRA) 62.5-62.5-40-3 MG CAPS Take 1 capsule by mouth daily. 30 capsule 1  . folic acid (FOLVITE) 824 MCG tablet Take 800 mcg by mouth daily.    Marland Kitchen levothyroxine (SYNTHROID, LEVOTHROID) 50 MCG tablet TAKE 1 TABLET ONCE A DAY 90 tablet 3  . LORazepam (ATIVAN) 0.5 MG tablet Take 1 tablet (0.5 mg total) by mouth 2 (two) times daily as needed for anxiety. 60 tablet 0  . Melatonin 3 MG CAPS Take 1 capsule by mouth at bedtime as needed.    . meloxicam (MOBIC) 15 MG tablet Take 1 tablet (15 mg total) by mouth daily. 15 tablet 0  . OCELLA 3-0.03 MG tablet Take 1 tablet by mouth daily.     Marland Kitchen UNABLE TO FIND Take 2 tablets by mouth daily. Med Name: Prenatal DHA & Folic Acid gummies     No current facility-administered medications on file prior to visit.    ROS Review of Systems  Constitutional: Negative for fever, chills,  diaphoresis, appetite change, fatigue and unexpected weight change.  HENT: Negative for congestion, ear pain, hearing loss, postnasal drip, rhinorrhea, sneezing, sore throat and trouble swallowing.   Eyes: Negative for pain.  Respiratory: Negative for cough, chest tightness and shortness of breath.   Cardiovascular: Negative for chest pain and palpitations.  Gastrointestinal: Negative for nausea, vomiting, abdominal pain, diarrhea and constipation.  Genitourinary: Negative for dysuria, frequency and menstrual problem.  Musculoskeletal: Negative for joint swelling and arthralgias.  Skin: Negative for rash.  Neurological: Negative for dizziness, weakness, numbness and headaches.  Psychiatric/Behavioral: Negative for dysphoric mood and agitation.    Objective:  BP 145/92 mmHg  Pulse 92  Temp(Src) 98.5 F (36.9 C) (Oral)  Ht 5' 2.75" (1.594 m)  Wt 295 lb 12.8 oz (134.174 kg)  BMI 52.81 kg/m2  LMP 08/03/2014  Physical Exam  Constitutional: She is oriented to person, place, and time. She appears well-developed and well-nourished. No distress.  HENT:  Head: Normocephalic and atraumatic.  Right Ear: External ear normal.  Left Ear: External ear normal.  Nose: Nose normal.  Mouth/Throat: Oropharynx is clear and moist.  Eyes: Conjunctivae and EOM are normal. Pupils are equal, round, and reactive to light.  Neck: Normal range of motion. Neck supple. No thyromegaly present.  Cardiovascular: Normal rate, regular rhythm and normal heart sounds.   No murmur heard. Pulmonary/Chest: Effort normal and breath sounds normal. No  respiratory distress. She has no wheezes. She has no rales.  Abdominal: Soft. Bowel sounds are normal.  Musculoskeletal: She exhibits tenderness (anterior and lateral aspect of the deltoid insertion at the right upper extremity.).  Right upper extremity range of motion is restricted for about 90 for external rotation. Diminished strength to 4/5 for rotation. Diminished  abduction 4/5 strength  Lymphadenopathy:    She has no cervical adenopathy.  Neurological: She is alert and oriented to person, place, and time. She has normal reflexes.  Skin: Skin is warm and dry.  Psychiatric: She has a normal mood and affect. Her behavior is normal. Judgment and thought content normal.  Vitals reviewed.   Assessment & Plan:   Diamond Marshall was seen today for arm pain.  Diagnoses and all orders for this visit:  Rotator cuff arthropathy, right  Pain in joint, shoulder region, right Orders: -     DG Shoulder Right; Future -     Ambulatory referral to Physical Therapy  Other orders -     predniSONE (DELTASONE) 10 MG tablet; Take 5 daily for 3 days followed by 4,3,2 and 1 for 3 days each.   I have discontinued Ms. Tagg's ibuprofen. I am also having her start on predniSONE. Additionally, I am having her maintain her LORazepam, OCELLA, escitalopram, INTEGRA, levothyroxine, meloxicam, UNABLE TO FIND, folic acid, Melatonin, and cyanocobalamin.  Meds ordered this encounter  Medications  . predniSONE (DELTASONE) 10 MG tablet    Sig: Take 5 daily for 3 days followed by 4,3,2 and 1 for 3 days each.    Dispense:  45 tablet    Refill:  0     Follow-up: Return in about 2 weeks (around 08/24/2014).  Claretta Fraise, M.D.

## 2014-08-31 ENCOUNTER — Ambulatory Visit (INDEPENDENT_AMBULATORY_CARE_PROVIDER_SITE_OTHER): Payer: BLUE CROSS/BLUE SHIELD | Admitting: Family Medicine

## 2014-08-31 ENCOUNTER — Encounter: Payer: Self-pay | Admitting: Family Medicine

## 2014-08-31 VITALS — BP 137/84 | HR 92 | Temp 98.5°F | Ht 62.75 in | Wt 299.0 lb

## 2014-08-31 DIAGNOSIS — M12511 Traumatic arthropathy, right shoulder: Secondary | ICD-10-CM | POA: Diagnosis not present

## 2014-08-31 DIAGNOSIS — M12811 Other specific arthropathies, not elsewhere classified, right shoulder: Secondary | ICD-10-CM

## 2014-08-31 NOTE — Progress Notes (Signed)
Subjective:  Patient ID: Diamond Marshall, female    DOB: Apr 07, 1971  Age: 44 y.o. MRN: [id number]  CC: Arm Pain   HPI Diamond Marshall presents for recheck of the right upper arm. She has been resting it and taking the medications and doing exercises however she has not been to physical therapy. Pain is much better probably 80% back to normal.  History Kaysi has a past medical history of Anxiety; GERD (gastroesophageal reflux disease); Thyroid disease; Depression; and Anemia.   She has past surgical history that includes Cesarean section (1999) and Dilatation and currettagement.   Her family history includes CVA in her father; Hypertension in her mother.She reports that she has never smoked. She has never used smokeless tobacco. She reports that she does not drink alcohol or use illicit drugs.  Current Outpatient Prescriptions on File Prior to Visit  Medication Sig Dispense Refill  . escitalopram (LEXAPRO) 10 MG tablet Take 1 tablet (10 mg total) by mouth daily. 30 tablet 5  . Fe Fum-FePoly-Vit C-Vit B3 (INTEGRA) 62.5-62.5-40-3 MG CAPS Take 1 capsule by mouth daily. 30 capsule 1  . folic acid (FOLVITE) 491 MCG tablet Take 800 mcg by mouth daily.    Marland Kitchen levothyroxine (SYNTHROID, LEVOTHROID) 50 MCG tablet TAKE 1 TABLET ONCE A DAY 90 tablet 3  . LORazepam (ATIVAN) 0.5 MG tablet Take 1 tablet (0.5 mg total) by mouth 2 (two) times daily as needed for anxiety. 60 tablet 0  . Melatonin 3 MG CAPS Take 1 capsule by mouth at bedtime as needed.    . meloxicam (MOBIC) 15 MG tablet Take 1 tablet (15 mg total) by mouth daily. 15 tablet 0  . OCELLA 3-0.03 MG tablet Take 1 tablet by mouth daily.     Marland Kitchen UNABLE TO FIND Take 2 tablets by mouth daily. Med Name: Prenatal DHA & Folic Acid gummies    . cyanocobalamin (,VITAMIN B-12,) 1000 MCG/ML injection Take 1000 mcg every 15 days. 6 mL 1   No current facility-administered medications on file prior to visit.    ROS Review of Systems  Constitutional:  Negative for fever, chills, diaphoresis, appetite change, fatigue and unexpected weight change.  HENT: Negative for congestion, ear pain, hearing loss, postnasal drip, rhinorrhea, sneezing, sore throat and trouble swallowing.   Eyes: Negative for pain.  Respiratory: Negative for cough, chest tightness and shortness of breath.   Cardiovascular: Negative for chest pain and palpitations.  Gastrointestinal: Negative for nausea, vomiting, abdominal pain, diarrhea and constipation.  Genitourinary: Negative for dysuria, frequency and menstrual problem.  Musculoskeletal: Negative for joint swelling and arthralgias.  Skin: Negative for rash.  Neurological: Negative for dizziness, weakness, numbness and headaches.  Psychiatric/Behavioral: Negative for dysphoric mood and agitation.    Objective:  BP 137/84 mmHg  Pulse 92  Temp(Src) 98.5 F (36.9 C) (Oral)  Ht 5' 2.75" (1.594 m)  Wt 299 lb (135.626 kg)  BMI 53.38 kg/m2  LMP 08/03/2014  BP Readings from Last 3 Encounters:  08/31/14 137/84  08/10/14 145/92  07/09/14 157/81    Wt Readings from Last 3 Encounters:  08/31/14 299 lb (135.626 kg)  08/10/14 295 lb 12.8 oz (134.174 kg)  07/09/14 294 lb 12.8 oz (133.72 kg)     Physical Exam  Constitutional: She is oriented to person, place, and time. She appears well-developed and well-nourished. No distress.  HENT:  Head: Normocephalic and atraumatic.  Right Ear: External ear normal.  Left Ear: External ear normal.  Nose: Nose normal.  Mouth/Throat: Oropharynx  is clear and moist.  Eyes: Conjunctivae and EOM are normal. Pupils are equal, round, and reactive to light.  Neck: Normal range of motion. Neck supple. No thyromegaly present.  Cardiovascular: Normal rate, regular rhythm and normal heart sounds.   No murmur heard. Pulmonary/Chest: Effort normal and breath sounds normal. No respiratory distress. She has no wheezes. She has no rales.  Abdominal: Soft. Bowel sounds are normal. She  exhibits no distension. There is no tenderness.  Musculoskeletal: She exhibits tenderness (right upper arm for palpation over the biceps and triceps region. Moderate in severity).  Lymphadenopathy:    She has no cervical adenopathy.  Neurological: She is alert and oriented to person, place, and time. She has normal reflexes.  Skin: Skin is warm and dry.  Psychiatric: She has a normal mood and affect. Her behavior is normal. Judgment and thought content normal.    No results found for: HGBA1C  Lab Results  Component Value Date   WBC 10.2 07/09/2014   HGB 12.5 07/09/2014   HCT 39.8 07/09/2014   PLT 254 07/09/2014   GLUCOSE 86 07/09/2014   CHOL 147 07/24/2013   TRIG 255* 07/24/2013   HDL 59 07/24/2013   LDLCALC 37 07/24/2013   ALT 14 07/09/2014   AST 18 07/09/2014   NA 138 07/09/2014   K 3.6 07/09/2014   CL 108 07/09/2014   CREATININE 0.80 07/09/2014   BUN 12 07/09/2014   CO2 25 07/09/2014   TSH 2.640 07/24/2013    US Abdomen Complete  10/06/2006   Clinical Data:  44 year old female; right upper quadrant pain, nausea, bloating. ABDOMEN ULTRASOUND COMPLETE - 10/05/06:  Technique: Complete abdominal ultrasound examination was performed including evaluation of the liver, gallbladder, bile ducts, pancreas, kidneys, spleen, IVC, and abdominal aorta. No comparisons. Findings:  The gallbladder appears within normal limits with a wall thickness of 2.8 mm.  No stones or pericholecystic fluid.  The patient is tender over the gallbladder consistent with a Murphy's sign.  No biliary dilatation with the common bile duct measuring 3 mm in diameter.  The liver demonstrates coarse increased echogenicity consistent with mild fatty infiltration. This limits evaluation for a focal hepatic abnormality. The IVC, spleen, kidneys, and aorta are within normal limits. The right kidney measures 12.4 cm and the left kidney measures 12.8 cm.  No renal obstruction or hydronephrosis.  No aneurysm or ascites. The  pancreas is obscured by bowel gas.  The examination is limited also due to body habitus. IMPRESSION:  1.   The gallbladder appears sonographically normal without biliary dilatation.  Mild nonspecific tenderness over the gallbladder during the study elicited. 2.   Fatty infiltration of the liver. 3.   Obscured pancreas.  Provider: Eldridge Scot   Assessment & Plan:   Emerson was seen today for arm pain.  Diagnoses and all orders for this visit:  Rotator cuff arthropathy, right  I have discontinued Ms. Weier's predniSONE. I am also having her maintain her LORazepam, OCELLA, escitalopram, INTEGRA, levothyroxine, meloxicam, UNABLE TO FIND, folic acid, Melatonin, and cyanocobalamin.  No orders of the defined types were placed in this encounter.     Follow-up: No Follow-up on file.  Claretta Fraise, M.D.

## 2014-09-02 ENCOUNTER — Other Ambulatory Visit: Payer: Self-pay | Admitting: Family Medicine

## 2014-09-03 NOTE — Telephone Encounter (Signed)
Last seen 08/31/14 Dr Livia Snellen  If approved route to nurse to call into [facility]

## 2014-09-04 ENCOUNTER — Other Ambulatory Visit: Payer: Self-pay | Admitting: *Deleted

## 2014-09-04 MED ORDER — LORAZEPAM 0.5 MG PO TABS: 0.5000 mg | ORAL_TABLET | Freq: Two times a day (BID) | ORAL | Status: DC | PRN

## 2014-09-04 NOTE — Telephone Encounter (Signed)
Last filled 12/06/2012.  Seen by Dr Livia Snellen 08/31/14. Uses [facility], if approved

## 2014-09-04 NOTE — Telephone Encounter (Signed)
Patient is requesting ativan

## 2014-09-04 NOTE — Telephone Encounter (Signed)
What medication does she want refilled?

## 2014-09-07 NOTE — Telephone Encounter (Signed)
rx called into pharmacy

## 2014-09-08 ENCOUNTER — Other Ambulatory Visit: Payer: Self-pay

## 2014-09-08 DIAGNOSIS — F329 Major depressive disorder, single episode, unspecified: Secondary | ICD-10-CM

## 2014-09-08 DIAGNOSIS — F32A Depression, unspecified: Secondary | ICD-10-CM

## 2014-09-08 MED ORDER — ESCITALOPRAM OXALATE 10 MG PO TABS: 10.0000 mg | ORAL_TABLET | Freq: Every day | ORAL | Status: DC

## 2014-09-15 ENCOUNTER — Telehealth: Payer: Self-pay | Admitting: Family Medicine

## 2014-10-21 ENCOUNTER — Ambulatory Visit: Payer: BLUE CROSS/BLUE SHIELD | Attending: Family Medicine | Admitting: Physical Therapy

## 2014-10-21 DIAGNOSIS — M25511 Pain in right shoulder: Secondary | ICD-10-CM

## 2014-10-21 NOTE — Therapy (Signed)
[facility] [place], [place], [address] Phone: [phone]   Fax:  [phone]  Physical Therapy Evaluation  Patient Details  Name: Diamond Marshall MRN: [id number] Date of Birth: [birth year] Referring Provider:  Claretta Fraise, MD  Encounter Date: 10/21/2014      PT End of Session - 10/21/14 0918    Visit Number 1   Number of Visits 12   Date for PT Re-Evaluation 12/02/14   PT Start Time 3338   PT Stop Time 0944   PT Time Calculation (min) 47 min   Activity Tolerance Patient tolerated treatment well      Past Medical History  Diagnosis Date  . Anxiety   . GERD (gastroesophageal reflux disease)   . Thyroid disease     hypothyroidism  . Depression   . Anemia     b12 deficiency    Past Surgical History  Procedure Laterality Date  . Cesarean section  1999  . Dilatation and currettagement      for miscarriage 18 years ago    There were no vitals filed for this visit.  Visit Diagnosis:  Right shoulder pain - Plan: PT plan of care cert/re-cert      Subjective Assessment - 10/21/14 3291    Subjective Certain right shoulder movements are excuriating.   Limitations --  Raising right arm.   Patient Stated Goals Get rid of right shoulder pain.   Currently in Pain? Yes   Pain Score 2   Resting pain-level.   Pain Location Shoulder   Pain Orientation Right   Pain Descriptors / Indicators Aching   Pain Onset More than a month ago   Pain Frequency Constant   Aggravating Factors  Certain movements of right shoulder.            [facility] PT Assessment - 10/21/14 0001    Assessment   Medical Diagnosis Right shoulder pain.   Onset Date/Surgical Date --  6 months.   Precautions   Precautions None   Restrictions   Weight Bearing Restrictions No   Balance Screen   Has the patient fallen in the past 6 months No   Has the patient had a decrease in activity level because of a fear of falling?  No   Is the patient  reluctant to leave their home because of a fear of falling?  No   Home Environment   Living Environment Private residence   Prior Function   Level of Independence Independent   Posture/Postural Control   Posture/Postural Control Postural limitations   Postural Limitations Rounded Shoulders;Forward head   ROM / Strength   AROM / PROM / Strength AROM;Strength   AROM   Overall AROM Comments Full active right shoulder range of motion.   Strength   Overall Strength Comments Normal right shoulder strength.                   [facility] Adult PT Treatment/Exercise - 10/21/14 0001    Modalities   Modalities Electrical Stimulation;Iontophoresis   Electrical Stimulation   Electrical Stimulation Location Right shoulder.   Electrical Stimulation Action 80-150HZ  at 100% scan x 20 minutes.   Electrical Stimulation Goals Pain   Iontophoresis   Type of Iontophoresis Dexamethasone   Location Right anterior shoulder   Dose 4mg /ml---2.0 ml   Time Patch                     PT Long Term Goals - 10/21/14 1022  PT LONG TERM GOAL #1   Title Ind with HEP.   Time 6   Period Weeks   Status New   PT LONG TERM GOAL #2   Title Perform ADL's with right shoulder pain not > 2-3/10.               Plan - 10/21/14 0924    Clinical Impression Statement The patient reports right shoulder pain over the last 6 months.  Pain can rise to 71/24 with certain right shoulder movements (especially abduction).  Her resting pain-level is a 2/10 today.  Rest decreases pain.   Pt will benefit from skilled therapeutic intervention in order to improve on the following deficits Pain   Rehab Potential Excellent   PT Frequency 2x / week   PT Duration 4 weeks   PT Treatment/Interventions Electrical Stimulation;Traction;Iontophoresis 4mg /ml Dexamethasone;Therapeutic exercise;Manual techniques   PT Next Visit Plan Combo E'Stim/U/S to right bicipital groove area; E'stim; STW/M and Ionto patch.          Problem List Patient Active Problem List   Diagnosis Date Noted  . Metabolic syndrome X 58/01/9832  . Elevated random blood glucose level 08/07/2013  . Hypertriglyceridemia 08/07/2013  . ADD (attention deficit disorder) 07/24/2013  .  hyperlipidemia 10/09/2012  . Obesity 10/09/2012  . Hypothyroidism 10/09/2012  . B12 deficiency 10/09/2012    Quinisha Mould, [place] MPT 10/21/2014, 10:36 AM  [facility] [address] [place], [place], [address] Phone: [phone]   Fax:  [phone]

## 2014-10-28 ENCOUNTER — Encounter: Payer: BLUE CROSS/BLUE SHIELD | Admitting: Physical Therapy

## 2014-10-29 ENCOUNTER — Encounter: Payer: BLUE CROSS/BLUE SHIELD | Admitting: Physical Therapy

## 2014-10-30 ENCOUNTER — Ambulatory Visit: Payer: BLUE CROSS/BLUE SHIELD | Admitting: Physical Therapy

## 2014-10-30 DIAGNOSIS — M25511 Pain in right shoulder: Secondary | ICD-10-CM

## 2014-10-30 NOTE — Patient Instructions (Addendum)
EXTERNAL ROTATION: Standing - Stable: Exercise Band (Active)   Stand, right arm bent to 90, elbow against side, hand forward. Against yellow resistance band, rotate forearm outward, keeping elbow at side (place towel roll between elbow and side). Rotate forearm outward as far as possible. Complete _2__ sets of 10-15_ repetitions. Perform _2__ sessions per day.  Copyright  VHI. All rights reserved.

## 2014-10-30 NOTE — Therapy (Signed)
[facility] [place], [place], [address] Phone: [phone]   Fax:  [phone]  Physical Therapy Treatment  Patient Details  Name: Diamond Marshall MRN: [id number] Date of Birth: 1971/05/07 Referring Provider:  Claretta Fraise, MD  Encounter Date: 10/30/2014      PT End of Session - 10/30/14 1007    Visit Number 2   Number of Visits 12   Date for PT Re-Evaluation 12/02/14   PT Start Time 0946   PT Stop Time 1029   PT Time Calculation (min) 43 min   Activity Tolerance Patient tolerated treatment well      Past Medical History  Diagnosis Date  . Anxiety   . GERD (gastroesophageal reflux disease)   . Thyroid disease     hypothyroidism  . Depression   . Anemia     b12 deficiency    Past Surgical History  Procedure Laterality Date  . Cesarean section  1999  . Dilatation and currettagement      for miscarriage 18 years ago    There were no vitals filed for this visit.  Visit Diagnosis:  Right shoulder pain      Subjective Assessment - 10/30/14 1001    Subjective The treatment helped.   Pain Score 2    Pain Location Shoulder   Pain Orientation Right   Pain Descriptors / Indicators Aching   Pain Onset More than a month ago   Pain Frequency Constant                         [facility] Adult PT Treatment/Exercise - 10/30/14 1003    Ultrasound   Ultrasound Location Combo E'stim/U/S at 1.50 W/CM2 x 8 minutes to patient's right anterior shoulder.   Iontophoresis   Type of Iontophoresis Dexamethasone   Location Right anterior shoulder   Dose 4mg /ml---2.0 ml   Time Patch                     PT Long Term Goals - 10/21/14 1022    PT LONG TERM GOAL #1   Title Ind with HEP.   Time 6   Period Weeks   Status New   PT LONG TERM GOAL #2   Title Perform ADL's with right shoulder pain not > 2-3/10.               Problem List Patient Active Problem List   Diagnosis Date  Noted  . Metabolic syndrome X 75/91/6384  . Elevated random blood glucose level 08/07/2013  . Hypertriglyceridemia 08/07/2013  . ADD (attention deficit disorder) 07/24/2013  .  hyperlipidemia 10/09/2012  . Obesity 10/09/2012  . Hypothyroidism 10/09/2012  . B12 deficiency 10/09/2012    Aloma Boch, [place] MPT 10/30/2014, 10:57 AM  [facility] [address] [place], [place], [address] Phone: [phone]   Fax:  [phone]

## 2014-11-06 ENCOUNTER — Other Ambulatory Visit (HOSPITAL_COMMUNITY): Payer: Self-pay

## 2014-11-06 ENCOUNTER — Ambulatory Visit (HOSPITAL_COMMUNITY): Payer: Self-pay | Admitting: Hematology & Oncology

## 2015-01-14 ENCOUNTER — Other Ambulatory Visit: Payer: Self-pay | Admitting: Family Medicine

## 2015-01-14 NOTE — Telephone Encounter (Signed)
Last seen 08/31/14  Dr Livia Snellen

## 2015-02-19 ENCOUNTER — Other Ambulatory Visit: Payer: Self-pay | Admitting: Family Medicine

## 2015-02-22 NOTE — Telephone Encounter (Signed)
Last seen 08/31/14  Dr Stacks 

## 2015-03-08 NOTE — Therapy (Signed)
[facility] [place], [place], [address] Phone: [phone]   Fax:  [phone]  Physical Therapy Treatment  Patient Details  Name: Diamond Marshall MRN: [id number] Date of Birth: Mar 11, 1971 No Data Recorded  Encounter Date: 10/30/2014    Past Medical History  Diagnosis Date  . Anxiety   . GERD (gastroesophageal reflux disease)   . Thyroid disease     hypothyroidism  . Depression   . Anemia     b12 deficiency    Past Surgical History  Procedure Laterality Date  . Cesarean section  1999  . Dilatation and currettagement      for miscarriage 18 years ago    There were no vitals filed for this visit.  Visit Diagnosis:  Right shoulder pain                                    PT Long Term Goals - 10/21/14 1022    PT LONG TERM GOAL #1   Title Ind with HEP.   Time 6   Period Weeks   Status New   PT LONG TERM GOAL #2   Title Perform ADL's with right shoulder pain not > 2-3/10.               Problem List Patient Active Problem List   Diagnosis Date Noted  . Metabolic syndrome X 78/58/8502  . Elevated random blood glucose level 08/07/2013  . Hypertriglyceridemia 08/07/2013  . ADD (attention deficit disorder) 07/24/2013  .  hyperlipidemia 10/09/2012  . Obesity 10/09/2012  . Hypothyroidism 10/09/2012  . B12 deficiency 10/09/2012   PHYSICAL THERAPY DISCHARGE SUMMARY  Visits from Start of Care:   Current functional level related to goals / functional outcomes: Please see above.   Remaining deficits: Pt did not return.   Education / Equipment:  Plan: Patient agrees to discharge.  Patient goals were not met. Patient is being discharged due to the physician's request.  ?????      Christobal Morado, [place] MPT 03/08/2015, 4:43 PM  [facility] [place], [place], [address] Phone: [phone]   Fax:   [phone]  Name: Diamond Marshall MRN: [id number] Date of Birth: 03/14/1971

## 2015-03-26 ENCOUNTER — Other Ambulatory Visit: Payer: Self-pay | Admitting: Family Medicine

## 2015-03-29 ENCOUNTER — Other Ambulatory Visit: Payer: Self-pay | Admitting: Family Medicine

## 2015-07-07 ENCOUNTER — Other Ambulatory Visit: Payer: Self-pay | Admitting: Family Medicine

## 2015-07-08 NOTE — Telephone Encounter (Signed)
Last seen 08/31/14  Dr Livia Snellen   Last thyroid 07/09/14

## 2015-07-09 NOTE — Telephone Encounter (Signed)
Authorize 30 days only. Then contact the patient letting them know that they will need an appointment before any further prescriptions can be sent in. 

## 2015-07-22 ENCOUNTER — Telehealth: Payer: Self-pay | Admitting: Family Medicine

## 2015-07-26 NOTE — Telephone Encounter (Signed)
[facility] gynecology

## 2015-08-01 NOTE — Therapy (Signed)
[facility] [place], [place], [address] Phone: [phone]   Fax:  [phone]  Physical Therapy Treatment  Patient Details  Name: Diamond Marshall MRN: [id number] Date of Birth: [birth year] No Data Recorded  Encounter Date: 10/30/2014    Past Medical History  Diagnosis Date  . Anxiety   . GERD (gastroesophageal reflux disease)   . Thyroid disease     hypothyroidism  . Depression   . Anemia     b12 deficiency    Past Surgical History  Procedure Laterality Date  . Cesarean section  1999  . Dilatation and currettagement      for miscarriage 18 years ago    There were no vitals filed for this visit.  Visit Diagnosis:  Right shoulder pain                                    PT Long Term Goals - 10/21/14 1022    PT LONG TERM GOAL #1   Title Ind with HEP.   Time 6   Period Weeks   Status New   PT LONG TERM GOAL #2   Title Perform ADL's with right shoulder pain not > 2-3/10.               Problem List Patient Active Problem List   Diagnosis Date Noted  . Metabolic syndrome X 18/59/0931  . Elevated random blood glucose level 08/07/2013  . Hypertriglyceridemia 08/07/2013  . ADD (attention deficit disorder) 07/24/2013  .  hyperlipidemia 10/09/2012  . Obesity 10/09/2012  . Hypothyroidism 10/09/2012  . B12 deficiency 10/09/2012   PHYSICAL THERAPY DISCHARGE SUMMARY  Visits from Start of Care:   Current functional level related to goals / functional outcomes: Please see above.   Remaining deficits: Continued right shoulder pain.   Education / Equipment:  Plan: Patient agrees to discharge.  Patient goals were not met. Patient is being discharged due to not returning since the last visit.  ?????      Graeson Nouri, [place] MPT 08/01/2015, 4:02 PM  [facility] [address] [place], [place], [address] Phone: [phone]    Fax:  [phone]  Name: [name] MRN: [id number] Date of Birth: May 27, 1970

## 2015-08-04 ENCOUNTER — Ambulatory Visit: Payer: BLUE CROSS/BLUE SHIELD | Admitting: Family Medicine

## 2015-08-05 ENCOUNTER — Encounter: Payer: Self-pay | Admitting: Family Medicine

## 2015-08-10 ENCOUNTER — Other Ambulatory Visit (HOSPITAL_COMMUNITY): Payer: Self-pay | Admitting: Oncology

## 2015-08-13 ENCOUNTER — Ambulatory Visit (INDEPENDENT_AMBULATORY_CARE_PROVIDER_SITE_OTHER): Payer: BLUE CROSS/BLUE SHIELD | Admitting: Family Medicine

## 2015-08-13 ENCOUNTER — Encounter: Payer: Self-pay | Admitting: Family Medicine

## 2015-08-13 VITALS — BP 129/74 | HR 85 | Temp 97.1°F | Ht 63.0 in | Wt 296.0 lb

## 2015-08-13 DIAGNOSIS — E669 Obesity, unspecified: Secondary | ICD-10-CM

## 2015-08-13 DIAGNOSIS — M25561 Pain in right knee: Secondary | ICD-10-CM | POA: Diagnosis not present

## 2015-08-13 DIAGNOSIS — M25562 Pain in left knee: Secondary | ICD-10-CM

## 2015-08-13 MED ORDER — PHENTERMINE HCL 37.5 MG PO CAPS: 37.5000 mg | ORAL_CAPSULE | ORAL | Status: DC

## 2015-08-13 MED ORDER — TOPIRAMATE 25 MG PO TABS: 25.0000 mg | ORAL_TABLET | Freq: Every day | ORAL | Status: DC

## 2015-08-14 LAB — CBC WITH DIFFERENTIAL/PLATELET
BASOS ABS: 0.1 10*3/uL (ref 0.0–0.2)
Basos: 1 %
EOS (ABSOLUTE): 0.1 10*3/uL (ref 0.0–0.4)
EOS: 1 %
HEMATOCRIT: 35.5 % (ref 34.0–46.6)
HEMOGLOBIN: 11.4 g/dL (ref 11.1–15.9)
IMMATURE GRANS (ABS): 0 10*3/uL (ref 0.0–0.1)
IMMATURE GRANULOCYTES: 0 %
LYMPHS ABS: 3.3 10*3/uL — AB (ref 0.7–3.1)
LYMPHS: 32 %
MCH: 25.3 pg — ABNORMAL LOW (ref 26.6–33.0)
MCHC: 32.1 g/dL (ref 31.5–35.7)
MCV: 79 fL (ref 79–97)
MONOCYTES: 6 %
Monocytes Absolute: 0.6 10*3/uL (ref 0.1–0.9)
Neutrophils Absolute: 6.1 10*3/uL (ref 1.4–7.0)
Neutrophils: 60 %
Platelets: 439 10*3/uL — ABNORMAL HIGH (ref 150–379)
RBC: 4.51 x10E6/uL (ref 3.77–5.28)
RDW: 14.8 % (ref 12.3–15.4)
WBC: 10.1 10*3/uL (ref 3.4–10.8)

## 2015-08-14 LAB — CMP14+EGFR
ALBUMIN: 3.7 g/dL (ref 3.5–5.5)
ALT: 8 IU/L (ref 0–32)
AST: 11 IU/L (ref 0–40)
Albumin/Globulin Ratio: 1.2 (ref 1.2–2.2)
Alkaline Phosphatase: 108 IU/L (ref 39–117)
BUN / CREAT RATIO: 13 (ref 9–23)
BUN: 10 mg/dL (ref 6–24)
Bilirubin Total: <0.2 mg/dL (ref 0.0–1.2)
CALCIUM: 8.7 mg/dL (ref 8.7–10.2)
CO2: 21 mmol/L (ref 18–29)
CREATININE: 0.75 mg/dL (ref 0.57–1.00)
Chloride: 99 mmol/L (ref 96–106)
GFR calc Af Amer: 112 mL/min/{1.73_m2} (ref >59)
GFR, EST NON AFRICAN AMERICAN: 97 mL/min/{1.73_m2} (ref >59)
GLUCOSE: 82 mg/dL (ref 65–99)
Globulin, Total: 3.1 g/dL (ref 1.5–4.5)
Potassium: 4.2 mmol/L (ref 3.5–5.2)
SODIUM: 139 mmol/L (ref 134–144)
TOTAL PROTEIN: 6.8 g/dL (ref 6.0–8.5)

## 2015-08-14 LAB — TSH: TSH: 3.58 u[IU]/mL (ref 0.450–4.500)

## 2015-08-19 ENCOUNTER — Other Ambulatory Visit: Payer: Self-pay | Admitting: Family Medicine

## 2015-08-26 ENCOUNTER — Other Ambulatory Visit: Payer: Self-pay | Admitting: Family Medicine

## 2015-09-07 ENCOUNTER — Telehealth: Payer: Self-pay | Admitting: Family Medicine

## 2015-09-08 NOTE — Progress Notes (Signed)
Subjective:  Patient ID: Diamond Marshall, female    DOB: 01-20-1971  Age: 45 y.o. MRN: [id number]  CC: Leg Pain   HPI Diamond Marshall presents for bilateral leg pain, back pain due to weightChronic with acute overlay X 1 week. Crampy with dull ache in legs, midline lumbar ache. aLL ARE 4-6/10  History Diamond Marshall has a past medical history of Anxiety; GERD (gastroesophageal reflux disease); Thyroid disease; Depression; and Anemia.   She has past surgical history that includes Cesarean section (1999) and Dilatation and currettagement.   Her family history includes CVA in her father; Hypertension in her mother.She reports that she has never smoked. She has never used smokeless tobacco. She reports that she does not drink alcohol or use illicit drugs.  Current Outpatient Prescriptions on File Prior to Visit  Medication Sig Dispense Refill  . cyanocobalamin (,VITAMIN B-12,) 1000 MCG/ML injection INJECT 1 ML INTRAMUSCULARLY EVERY 15 DAYS 6 mL 0  . escitalopram (LEXAPRO) 10 MG tablet TAKE 1 TABLET DAILY 30 tablet 2  . LORazepam (ATIVAN) 0.5 MG tablet Take 1 tablet (0.5 mg total) by mouth 2 (two) times daily as needed for anxiety. 60 tablet 0  . meloxicam (MOBIC) 15 MG tablet Take 1 tablet (15 mg total) by mouth daily. 15 tablet 0  . OCELLA 3-0.03 MG tablet Take 1 tablet by mouth daily.     Marland Kitchen UNABLE TO FIND Take 2 tablets by mouth daily. Med Name: Prenatal DHA & Folic Acid gummies     No current facility-administered medications on file prior to visit.    ROS Review of Systems  Constitutional: Negative for fever, activity change and appetite change.  HENT: Negative for congestion, rhinorrhea and sore throat.   Eyes: Negative for visual disturbance.  Respiratory: Negative for cough and shortness of breath.   Cardiovascular: Negative for chest pain and palpitations.  Gastrointestinal: Negative for nausea, abdominal pain and diarrhea.  Genitourinary: Negative for dysuria.  Musculoskeletal:  Positive for myalgias, back pain and arthralgias.    Objective:  BP 129/74 mmHg  Pulse 85  Temp(Src) 97.1 F (36.2 C) (Oral)  Ht 5' 3" (1.6 m)  Wt 296 lb (134.265 kg)  BMI 52.45 kg/m2  SpO2 99%  LMP 08/02/2015  Physical Exam  Constitutional: She is oriented to person, place, and time. She appears well-developed and well-nourished. No distress.  HENT:  Head: Normocephalic and atraumatic.  Eyes: Conjunctivae and EOM are normal. Pupils are equal, round, and reactive to light.  Neck: Normal range of motion. Neck supple. No thyromegaly present.  Cardiovascular: Normal rate, regular rhythm and normal heart sounds.   No murmur heard. Pulmonary/Chest: Effort normal and breath sounds normal. No respiratory distress. She has no wheezes. She has no rales.  Abdominal: Soft. Bowel sounds are normal. She exhibits no distension. There is no tenderness.  Musculoskeletal: Normal range of motion. She exhibits tenderness. She exhibits no edema.       Lumbar back: She exhibits tenderness and spasm. She exhibits no deformity and normal pulse.  Lymphadenopathy:    She has no cervical adenopathy.  Neurological: She is alert and oriented to person, place, and time. She has normal reflexes.  Skin: Skin is warm and dry.  Psychiatric: She has a normal mood and affect. Her behavior is normal. Judgment and thought content normal.    Assessment & Plan:   Diamond Marshall was seen today for leg pain.  Diagnoses and all orders for this visit:  Obesity -     CBC  with Differential/Platelet -     TSH -     CMP14+EGFR  Arthralgia of both lower legs -     CBC with Differential/Platelet -     TSH -     CMP14+EGFR  Other orders -     topiramate (TOPAMAX) 25 MG tablet; Take 1 tablet (25 mg total) by mouth at bedtime. -     phentermine 37.5 MG capsule; Take 1 capsule (37.5 mg total) by mouth every morning.   I have discontinued Ms. Yackel's INTEGRA, folic acid, and Melatonin. I am also having her start on  topiramate and phentermine. Additionally, I am having her maintain her OCELLA, meloxicam, UNABLE TO FIND, LORazepam, escitalopram, and cyanocobalamin.  Meds ordered this encounter  Medications  . topiramate (TOPAMAX) 25 MG tablet    Sig: Take 1 tablet (25 mg total) by mouth at bedtime.    Dispense:  30 tablet    Refill:  2  . phentermine 37.5 MG capsule    Sig: Take 1 capsule (37.5 mg total) by mouth every morning.    Dispense:  30 capsule    Refill:  2     Follow-up: Return in about 3 months (around 11/13/2015).  Claretta Fraise, M.D.

## 2015-11-15 ENCOUNTER — Ambulatory Visit: Payer: BLUE CROSS/BLUE SHIELD | Admitting: Family Medicine

## 2015-11-15 ENCOUNTER — Encounter: Payer: Self-pay | Admitting: Family Medicine

## 2015-11-15 ENCOUNTER — Ambulatory Visit (INDEPENDENT_AMBULATORY_CARE_PROVIDER_SITE_OTHER): Payer: BLUE CROSS/BLUE SHIELD | Admitting: Family Medicine

## 2015-11-15 VITALS — BP 139/83 | HR 102 | Temp 97.7°F | Ht 63.0 in | Wt 291.0 lb

## 2015-11-15 DIAGNOSIS — S0080XA Unspecified superficial injury of other part of head, initial encounter: Secondary | ICD-10-CM

## 2015-11-15 DIAGNOSIS — W57XXXA Bitten or stung by nonvenomous insect and other nonvenomous arthropods, initial encounter: Secondary | ICD-10-CM | POA: Diagnosis not present

## 2015-11-15 NOTE — Progress Notes (Signed)
   HPI  Patient presents today here after a bug bite on the back of the head.  Patient explains that Friday she developed a tender nodule in the occipital scalp She developed neck pain and stiffness the following day and a worsening area of tenderness on the back of the head She also had swelling of the lymph nodes in either side of base of her head.  She denies fever, chills, sweats.  She states that today she feels much better and all of her symptoms are resolving.  They did not remove any bugs or tics. She is outside quite a bit and was concerned about tick bites.  PMH: Smoking status noted ROS: Per HPI  Objective: BP 139/83 mmHg  Pulse 102  Temp(Src) 97.7 F (36.5 C) (Oral)  Ht 5\' 3"  (1.6 m)  Wt 291 lb (131.997 kg)  BMI 51.56 kg/m2 Gen: NAD, alert, cooperative with exam HEENT: NCAT Neck: Full range of motion CV: RRR, good S1/S2, no murmur Resp: CTABL, no wheezes, non-labored Ext: No edema, warm Neuro: Alert and oriented, No gross deficits  Skin Occipital food with small papule that is yellow and crusted, no induration. Mild tenderness to palpation Occipital lymph nodes are not palpable  Assessment and plan:  # Arthropod bite Reassurance provided Symptoms are resolving, discussed watchful waiting as long as she continues to clinically improve. If she has any signs or symptoms that are concerning but discussed with her that I would be glad to do Lyme titers now and Lyme titers again in 6 weeks to be sure that she has not developed Lyme disease. If she develops continuing swelling of the glad to treat for bacterial infection, would cover with doxycycline given concern about tick bite. Tick bite is very low likelihood considering the did not remove the tick.     Laroy Apple, MD [facility] 11/15/2015, 3:38 PM

## 2015-11-15 NOTE — Patient Instructions (Signed)
Great to meet you!  If you have any concerns or worsening symptoms please come back to see [place].

## 2015-12-27 ENCOUNTER — Other Ambulatory Visit: Payer: Self-pay | Admitting: Family Medicine

## 2015-12-28 NOTE — Telephone Encounter (Signed)
Covering for PCP.   Patient with intermittent benzodiazepine use for anxiety. I will request that she comes in for an appointment as these are controlled substances.  Lexapro refilled.  Laroy Apple, MD [facility] 12/28/2015, 9:01 AM

## 2016-03-04 ENCOUNTER — Ambulatory Visit (INDEPENDENT_AMBULATORY_CARE_PROVIDER_SITE_OTHER): Payer: BLUE CROSS/BLUE SHIELD

## 2016-03-04 DIAGNOSIS — Z23 Encounter for immunization: Secondary | ICD-10-CM | POA: Diagnosis not present

## 2016-03-08 ENCOUNTER — Other Ambulatory Visit: Payer: Self-pay | Admitting: Family Medicine

## 2016-03-08 NOTE — Telephone Encounter (Signed)
Authorize 30 days only. Then contact the patient letting them know that they will need an appointment before any further prescriptions can be sent in. 

## 2016-03-09 NOTE — Telephone Encounter (Signed)
LMOVM that pt NTBS before next refill

## 2016-03-14 ENCOUNTER — Encounter: Payer: BLUE CROSS/BLUE SHIELD | Admitting: *Deleted

## 2016-03-15 ENCOUNTER — Ambulatory Visit (INDEPENDENT_AMBULATORY_CARE_PROVIDER_SITE_OTHER): Payer: BLUE CROSS/BLUE SHIELD | Admitting: Pediatrics

## 2016-03-15 ENCOUNTER — Encounter: Payer: Self-pay | Admitting: Pediatrics

## 2016-03-15 VITALS — BP 129/86 | HR 79 | Temp 97.6°F | Ht 63.0 in | Wt 291.0 lb

## 2016-03-15 DIAGNOSIS — E039 Hypothyroidism, unspecified: Secondary | ICD-10-CM

## 2016-03-15 DIAGNOSIS — E559 Vitamin D deficiency, unspecified: Secondary | ICD-10-CM | POA: Diagnosis not present

## 2016-03-15 DIAGNOSIS — E538 Deficiency of other specified B group vitamins: Secondary | ICD-10-CM

## 2016-03-15 DIAGNOSIS — F418 Other specified anxiety disorders: Secondary | ICD-10-CM | POA: Diagnosis not present

## 2016-03-15 DIAGNOSIS — F32A Depression, unspecified: Secondary | ICD-10-CM | POA: Insufficient documentation

## 2016-03-15 DIAGNOSIS — Z6841 Body Mass Index (BMI) 40.0 and over, adult: Secondary | ICD-10-CM

## 2016-03-15 DIAGNOSIS — E669 Obesity, unspecified: Secondary | ICD-10-CM

## 2016-03-15 DIAGNOSIS — L821 Other seborrheic keratosis: Secondary | ICD-10-CM

## 2016-03-15 DIAGNOSIS — F419 Anxiety disorder, unspecified: Secondary | ICD-10-CM

## 2016-03-15 DIAGNOSIS — F329 Major depressive disorder, single episode, unspecified: Secondary | ICD-10-CM | POA: Insufficient documentation

## 2016-03-15 DIAGNOSIS — IMO0001 Reserved for inherently not codable concepts without codable children: Secondary | ICD-10-CM

## 2016-03-15 MED ORDER — ESCITALOPRAM OXALATE 10 MG PO TABS: 15.0000 mg | ORAL_TABLET | Freq: Every day | ORAL | 2 refills | Status: DC

## 2016-03-15 MED ORDER — LORAZEPAM 0.5 MG PO TABS: 0.5000 mg | ORAL_TABLET | Freq: Two times a day (BID) | ORAL | 0 refills | Status: DC | PRN

## 2016-03-15 NOTE — Progress Notes (Signed)
Subjective:   Patient ID: Diamond Marshall, female    DOB: 1971-05-20, 45 y.o.   MRN: [id number] CC: Medication Refill follow up depression  HPI: Diamond Marshall is a 45 y.o. female presenting for Medication Refill  Depression: a little worse this year Daughter to college, struggling some with change in schedule, missing her daughter. Some benefits to schedule change as well Rarely using ativan, about once a month  Walking some, not regularly Has lost some weight, eating better since schedule has been more regular without high schooler at home  Gives herself vitamin B12 shots at home monthly Ran out of thyroid medication so stopped taking it some months ago  Depression screen [facility] 2/9 03/15/2016 11/15/2015 08/13/2015 08/10/2014 05/18/2014  Decreased Interest 2 1 0 0 0  Down, Depressed, Hopeless 1 1 0 1 0  PHQ - 2 Score 3 2 0 1 0  Altered sleeping 1 0 - - -  Tired, decreased energy 1 1 - - -  Change in appetite 2 2 - - -  Feeling bad or failure about yourself  2 2 - - -  Trouble concentrating 0 0 - - -  Moving slowly or fidgety/restless 0 0 - - -  Suicidal thoughts 0 0 - - -  PHQ-9 Score 9 7 - - -  Difficult doing work/chores Somewhat difficult - - - -     Relevant past medical, surgical, family and social history reviewed. Allergies and medications reviewed and updated. History  Smoking Status  . Never Smoker  Smokeless Tobacco  . Never Used   ROS: Per HPI   Objective:    BP 129/86   Pulse 79   Temp 97.6 F (36.4 C) (Oral)   Ht 5\' 3"  (1.6 m)   Wt 291 lb (132 kg)   BMI 51.55 kg/m   Wt Readings from Last 3 Encounters:  03/15/16 291 lb (132 kg)  11/15/15 291 lb (132 kg)  08/13/15 296 lb (134.3 kg)    Gen: NAD, alert, cooperative with exam, NCAT EYES: EOMI, no conjunctival injection, or no icterus ENT:  TMs pearly gray b/l, some scarring present R TM, OP without erythema CV: NRRR, normal S1/S2, no murmur, distal pulses 2+ b/l Resp: CTABL, no wheezes, normal  WOB Neuro: Alert and oriented, strength equal b/l UE and LE, coordination grossly normal MSK: normal muscle bulk Skin: L upper thigh with 104mm slightly raised flat topped papule consistent with seborrheic keratosis Psych: normal affect, mood is "ok"  Assessment & Plan:  Diamond Marshall was seen today for medication refill.  Diagnoses and all orders for this visit:  Hypothyroidism, unspecified type Was on levothyroxine 27mcg previously, out for some time Recheck TSH before restarting med -     TSH  B12 deficiency On monthly shots, checklevel today -     Vitamin B12  Anxiety and depression Rarely takes ativan, less than once a week Symptoms fairly well controlled, some worsening depression with daughter out of house Feels safe at home Discussed increasing lexapro, pt doesn't want to increase to 20mg , wants to try 15mg  first -     LORazepam (ATIVAN) 0.5 MG tablet; Take 1 tablet (0.5 mg total) by mouth 2 (two) times daily as needed for anxiety. -     escitalopram (LEXAPRO) 10 MG tablet; Take 1.5 tablets (15 mg total) by mouth daily.  Vitamin D deficiency Low in the past, will recheck -     VITAMIN D 25 Hydroxy (Vit-D Deficiency, Fractures)  Seborrheic keratosis Present  on L upper thigh Discussed skin changes to note  Class 3 obesity without serious comorbidity with body mass index (BMI) of 50.0 to 59.9 in adult, unspecified obesity type (HCC) Cont lifestyle changes, three meals a day, minimal snacking, increase physical activity Tried phentermine some months ago, had suicidal thoughts so stopped. Added to allergy list. Mood much improved now as above.  Follow up plan: Return in about 6 months (around 09/13/2016). Diamond Found, MD [place]

## 2016-03-16 LAB — VITAMIN D 25 HYDROXY (VIT D DEFICIENCY, FRACTURES): VIT D 25 HYDROXY: 15.1 ng/mL — AB (ref 30.0–100.0)

## 2016-03-16 LAB — VITAMIN B12: VITAMIN B 12: 676 pg/mL (ref 211–946)

## 2016-03-16 LAB — TSH: TSH: 4.05 u[IU]/mL (ref 0.450–4.500)

## 2016-06-01 ENCOUNTER — Ambulatory Visit (INDEPENDENT_AMBULATORY_CARE_PROVIDER_SITE_OTHER): Payer: BLUE CROSS/BLUE SHIELD | Admitting: Pediatrics

## 2016-06-01 ENCOUNTER — Encounter: Payer: Self-pay | Admitting: Pediatrics

## 2016-06-01 VITALS — BP 124/84 | HR 93 | Temp 98.0°F | Ht 63.0 in | Wt 293.4 lb

## 2016-06-01 DIAGNOSIS — F32A Depression, unspecified: Secondary | ICD-10-CM

## 2016-06-01 DIAGNOSIS — Z0000 Encounter for general adult medical examination without abnormal findings: Secondary | ICD-10-CM

## 2016-06-01 DIAGNOSIS — E538 Deficiency of other specified B group vitamins: Secondary | ICD-10-CM

## 2016-06-01 DIAGNOSIS — E559 Vitamin D deficiency, unspecified: Secondary | ICD-10-CM

## 2016-06-01 DIAGNOSIS — F329 Major depressive disorder, single episode, unspecified: Secondary | ICD-10-CM

## 2016-06-01 DIAGNOSIS — E039 Hypothyroidism, unspecified: Secondary | ICD-10-CM

## 2016-06-01 DIAGNOSIS — F419 Anxiety disorder, unspecified: Secondary | ICD-10-CM

## 2016-06-01 NOTE — Progress Notes (Signed)
  Subjective:   Patient ID: Diamond Marshall, female    DOB: 03-19-1971, 46 y.o.   MRN: [id number] CC: CPE  HPI: Diamond Marshall is a 46 y.o. female presenting for CPE  Years ago had vessel cauterization on cervix Most recent pap smears have been normal, last 01/2015  March 2018 has mammogram scheduled  lexapro has been helping with symptoms of anxiety Says anxiety and depression started at age 1 when she started birth control pills interested in stopping birth control pills  Having regular periods  Fam hx: Mat aunt with colon ca in 67s Mat cousin with breast ca in the 32s Mat aunt with ovarian cancer  Relevant past medical, surgical, family and social history reviewed. Allergies and medications reviewed and updated. History  Smoking Status  . Never Smoker  Smokeless Tobacco  . Never Used   ROS: All systems negative other than what is in HPI  Objective:    BP 124/84   Pulse 93   Temp 98 F (36.7 C) (Oral)   Ht 5\' 3"  (1.6 m)   Wt 293 lb 6.4 oz (133.1 kg)   BMI 51.97 kg/m   Wt Readings from Last 3 Encounters:  06/01/16 293 lb 6.4 oz (133.1 kg)  03/15/16 291 lb (132 kg)  11/15/15 291 lb (132 kg)    Gen: NAD, alert, cooperative with exam, NCAT EYES: EOMI, no conjunctival injection, or no icterus ENT:  OP without erythema LYMPH: no cervical LAD CV: NRRR, normal S1/S2, no murmur, distal pulses 2+ b/l Resp: CTABL, no wheezes, normal WOB Ext: No edema, warm Neuro: Alert and oriented MSK: normal muscle bulk  Assessment & Plan:  Diamond Marshall was seen today for CPE.  Diagnoses and all orders for this visit:  Encounter for preventive health examination Stop birth control per pt request, pt will use barrier protection mammo--scheduled Pap smear--UTD  Hypothyroidism, unspecified type TSH wnl last 02/2016, cont meds  B12 deficiency Cont B12 injections  Anxiety and depression Cont lexapro, has been helping with symptoms  Vitamin D deficiency Cont OTC  replacement  Follow up plan: Return in about 1 year (around 06/01/2017). [name], MD [place]

## 2016-07-03 ENCOUNTER — Telehealth: Payer: Self-pay | Admitting: Pediatrics

## 2016-07-03 MED ORDER — OSELTAMIVIR PHOSPHATE 75 MG PO CAPS: 75.0000 mg | ORAL_CAPSULE | Freq: Every day | ORAL | 0 refills | Status: DC

## 2016-07-03 NOTE — Telephone Encounter (Signed)
Tamiflu sent to pharmacy

## 2016-07-11 ENCOUNTER — Telehealth: Payer: Self-pay | Admitting: Pediatrics

## 2016-07-11 NOTE — Telephone Encounter (Signed)
Patient had discussed with Dr. 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  Dr. Evette Doffing had told her if she needed to increase to 20 mg per day to let her know.  Patient would like to make that increase, [employer].  Please advise.

## 2016-07-12 ENCOUNTER — Other Ambulatory Visit: Payer: Self-pay | Admitting: Nurse Practitioner

## 2016-07-12 MED ORDER — ESCITALOPRAM OXALATE 20 MG PO TABS: 20.0000 mg | ORAL_TABLET | Freq: Every day | ORAL | 5 refills | Status: DC

## 2016-07-12 NOTE — Progress Notes (Unsigned)
Increased lexapro to 20mg  daily

## 2016-07-19 ENCOUNTER — Encounter: Payer: BLUE CROSS/BLUE SHIELD | Admitting: *Deleted

## 2016-07-26 ENCOUNTER — Encounter: Payer: Self-pay | Admitting: Pediatrics

## 2016-07-26 ENCOUNTER — Ambulatory Visit (INDEPENDENT_AMBULATORY_CARE_PROVIDER_SITE_OTHER): Payer: BLUE CROSS/BLUE SHIELD | Admitting: Pediatrics

## 2016-07-26 VITALS — BP 144/85 | HR 83 | Temp 98.6°F | Ht 63.0 in | Wt 291.6 lb

## 2016-07-26 DIAGNOSIS — E669 Obesity, unspecified: Secondary | ICD-10-CM

## 2016-07-26 DIAGNOSIS — E538 Deficiency of other specified B group vitamins: Secondary | ICD-10-CM | POA: Diagnosis not present

## 2016-07-26 DIAGNOSIS — F418 Other specified anxiety disorders: Secondary | ICD-10-CM

## 2016-07-26 DIAGNOSIS — Z6841 Body Mass Index (BMI) 40.0 and over, adult: Secondary | ICD-10-CM | POA: Diagnosis not present

## 2016-07-26 DIAGNOSIS — F32A Depression, unspecified: Secondary | ICD-10-CM

## 2016-07-26 DIAGNOSIS — F329 Major depressive disorder, single episode, unspecified: Secondary | ICD-10-CM

## 2016-07-26 DIAGNOSIS — IMO0001 Reserved for inherently not codable concepts without codable children: Secondary | ICD-10-CM

## 2016-07-26 DIAGNOSIS — F419 Anxiety disorder, unspecified: Secondary | ICD-10-CM

## 2016-07-26 DIAGNOSIS — M791 Myalgia, unspecified site: Secondary | ICD-10-CM

## 2016-07-26 MED ORDER — ESCITALOPRAM OXALATE 20 MG PO TABS: 20.0000 mg | ORAL_TABLET | Freq: Every day | ORAL | 5 refills | Status: DC

## 2016-07-26 NOTE — Progress Notes (Signed)
  Subjective:   Patient ID: Diamond Marshall, female    DOB: 12/18/1970, 46 y.o.   MRN: [id number] CC: Generalized Body Aches (1 month)  HPI: Diamond Marshall is a 47 y.o. female presenting for Generalized Body Aches (1 month)  Depression: taking 43m lexapro a day Thinks it has been helping at increased dose  Has been having muscles aches all over for about a month Hurts 70% of the time Arms hurting more from position of sleeping Hurts more at the end of the day than in the  Hands swell slightly, rings fitting tighter Used to have neck pain, stopped, now has been ocming back No fevers, temp up to 99  Taking aleve, ibuprofen not every day Hurt a lot at the beginning of the weekend, got a B12 shot, helped herself   One period right as stopped OCP a few months ago Had been on OCP for 20 yrs  Has been following low salt diet, only drinking diet drinks No activity, sits at desk for work  Relevant past medical, surgical, family and social history reviewed. Allergies and medications reviewed and updated. History  Smoking Status  . Never Smoker  Smokeless Tobacco  . Never Used   ROS: Per HPI   Objective:    BP (!) 144/85   Pulse 83   Temp 98.6 F (37 C) (Oral)   Ht 5' 3" (1.6 m)   Wt 291 lb 9.6 oz (132.3 kg)   BMI 51.65 kg/m   Wt Readings from Last 3 Encounters:  07/26/16 291 lb 9.6 oz (132.3 kg)  06/01/16 293 lb 6.4 oz (133.1 kg)  03/15/16 291 lb (132 kg)    Gen: NAD, alert, cooperative with exam, NCAT EYES: EOMI, no conjunctival injection, or no icterus CV: NRRR, normal S1/S2, no murmur, distal pulses 2+ b/l Resp: CTABL, no wheezes, normal WOB Abd: +BS, soft, NTND.  Ext: No pitting edema, warm Neuro: Alert and oriented, strength equal b/l UE and LE, coordination grossly normal MSK: generally tender with palpation of muscles arms, legs  Assessment & Plan:  [name] seen today for generalized body aches.  Diagnoses and all orders for this visit:  Muscle ache No  fevers, no rash, no weakness Will get labs Says there is no way she could be pregnant If worsening or developing new symptoms let me know -     CK -     Vitamin B12 -     TSH -     CBC with Differential/Platelet -     CMP14+EGFR -     VITAMIN D 25 Hydroxy (Vit-D Deficiency, Fractures)  Anxiety and depression improved on increased dose of below continue -     escitalopram (LEXAPRO) 20 MG tablet; Take 1 tablet (20 mg total) by mouth daily.  Class 3 obesity without serious comorbidity with body mass index (BMI) of 50.0 to 59.9 in adult, unspecified obesity type ([facility] Encouraged daily activity starting with 5 min of walking, increase to 20 minutes a day  B12 deficiency Feels better after b12 shots Recheck b12 level, mma Cont b12 shots  Follow up plan: Return in about 3 months (around 10/26/2016). CAssunta Found MD [place]

## 2016-07-28 LAB — CMP14+EGFR
ALK PHOS: 123 IU/L — AB (ref 39–117)
ALT: 15 IU/L (ref 0–32)
AST: 15 IU/L (ref 0–40)
Albumin/Globulin Ratio: 1.3 (ref 1.2–2.2)
Albumin: 4 g/dL (ref 3.5–5.5)
BUN/Creatinine Ratio: 16 (ref 9–23)
BUN: 12 mg/dL (ref 6–24)
Bilirubin Total: 0.3 mg/dL (ref 0.0–1.2)
CALCIUM: 9.8 mg/dL (ref 8.7–10.2)
CO2: 25 mmol/L (ref 18–29)
CREATININE: 0.76 mg/dL (ref 0.57–1.00)
Chloride: 97 mmol/L (ref 96–106)
GFR calc Af Amer: 110 mL/min/{1.73_m2} (ref >59)
GFR calc non Af Amer: 95 mL/min/{1.73_m2} (ref >59)
GLUCOSE: 89 mg/dL (ref 65–99)
Globulin, Total: 3 g/dL (ref 1.5–4.5)
Potassium: 4.6 mmol/L (ref 3.5–5.2)
SODIUM: 140 mmol/L (ref 134–144)
Total Protein: 7 g/dL (ref 6.0–8.5)

## 2016-07-28 LAB — CBC WITH DIFFERENTIAL/PLATELET
BASOS ABS: 0 10*3/uL (ref 0.0–0.2)
Basos: 0 %
EOS (ABSOLUTE): 0.1 10*3/uL (ref 0.0–0.4)
Eos: 1 %
Hematocrit: 36.5 % (ref 34.0–46.6)
Hemoglobin: 11.7 g/dL (ref 11.1–15.9)
IMMATURE GRANULOCYTES: 0 %
Immature Grans (Abs): 0 10*3/uL (ref 0.0–0.1)
LYMPHS ABS: 2.6 10*3/uL (ref 0.7–3.1)
Lymphs: 31 %
MCH: 24.9 pg — ABNORMAL LOW (ref 26.6–33.0)
MCHC: 32.1 g/dL (ref 31.5–35.7)
MCV: 78 fL — ABNORMAL LOW (ref 79–97)
MONOS ABS: 0.5 10*3/uL (ref 0.1–0.9)
Monocytes: 6 %
NEUTROS PCT: 62 %
Neutrophils Absolute: 5.1 10*3/uL (ref 1.4–7.0)
PLATELETS: 378 10*3/uL (ref 150–379)
RBC: 4.7 x10E6/uL (ref 3.77–5.28)
RDW: 15.5 % — ABNORMAL HIGH (ref 12.3–15.4)
WBC: 8.2 10*3/uL (ref 3.4–10.8)

## 2016-07-28 LAB — CK: Total CK: 50 U/L (ref 24–173)

## 2016-07-28 LAB — TSH: TSH: 4.92 u[IU]/mL — AB (ref 0.450–4.500)

## 2016-07-28 LAB — VITAMIN B12

## 2016-07-28 LAB — METHYLMALONIC ACID, SERUM: METHYLMALONIC ACID: 105 nmol/L (ref 0–378)

## 2016-07-28 LAB — VITAMIN D 25 HYDROXY (VIT D DEFICIENCY, FRACTURES): Vit D, 25-Hydroxy: 33.7 ng/mL (ref 30.0–100.0)

## 2016-07-31 ENCOUNTER — Encounter: Payer: BLUE CROSS/BLUE SHIELD | Admitting: *Deleted

## 2016-08-01 LAB — FERRITIN: Ferritin: 69 ng/mL (ref 15–150)

## 2016-08-01 LAB — T4, FREE: FREE T4: 0.92 ng/dL (ref 0.82–1.77)

## 2016-08-01 LAB — T3: T3, Total: 117 ng/dL (ref 71–180)

## 2016-08-01 LAB — SPECIMEN STATUS REPORT

## 2016-08-14 ENCOUNTER — Encounter: Payer: Self-pay | Admitting: Pediatrics

## 2017-01-30 ENCOUNTER — Other Ambulatory Visit: Payer: Self-pay | Admitting: *Deleted

## 2017-01-30 MED ORDER — FLUTICASONE PROPIONATE 50 MCG/ACT NA SUSP: 2.0000 | Freq: Every day | NASAL | 10 refills | Status: DC

## 2017-01-30 NOTE — Telephone Encounter (Signed)
Rcvd signed faxed refill authorization back from Dr Livia Snellen Sent refill in Electronically. Signed fax sent to be scanned in

## 2017-01-30 NOTE — Telephone Encounter (Signed)
Former Dr. Edrick Oh patient. Last OV here 07/26/16 Requesting fluticasone 50 mcg nasal spray Not on medication list Please advise Pt uses [employer]

## 2017-01-30 NOTE — Addendum Note (Signed)
Addended by: Antonietta Barcelona D on: 01/30/2017 05:03 PM   Modules accepted: Orders

## 2017-01-31 ENCOUNTER — Encounter: Payer: Self-pay | Admitting: Pediatrics

## 2017-01-31 ENCOUNTER — Ambulatory Visit (INDEPENDENT_AMBULATORY_CARE_PROVIDER_SITE_OTHER): Payer: BLUE CROSS/BLUE SHIELD | Admitting: Pediatrics

## 2017-01-31 VITALS — BP 138/89 | HR 95 | Temp 97.3°F | Ht 63.0 in | Wt 288.2 lb

## 2017-01-31 DIAGNOSIS — M62838 Other muscle spasm: Secondary | ICD-10-CM

## 2017-01-31 MED ORDER — CYCLOBENZAPRINE HCL 10 MG PO TABS: 10.0000 mg | ORAL_TABLET | Freq: Two times a day (BID) | ORAL | 0 refills | Status: DC | PRN

## 2017-01-31 NOTE — Patient Instructions (Signed)
Ibuprofen 600mg  three times a day for the next few days  Flexeril 5-10mg  as needed for muscle spasm, will cause drowsiness

## 2017-01-31 NOTE — Progress Notes (Signed)
  Subjective:   Patient ID: Diamond Marshall, female    DOB: Apr 23, 1971, 46 y.o.   MRN: [id number] CC: Neck Pain (Started yesterday)  HPI: Diamond Marshall is a 46 y.o. female presenting for Neck Pain (Started yesterday)  Neck pain/spasm L side of neck she woke up with yesterday Couldn't get comfortable last night  Took 400mg  ibuprofen once Used muscle rub on it No fevers or chills Appetite has been fine  Has had similar neck pain in the past Here today with her husband No change in her activity level  Relevant past medical, surgical, family and social history reviewed. Allergies and medications reviewed and updated. History  Smoking Status  . Never Smoker  Smokeless Tobacco  . Never Used   ROS: Per HPI   Objective:    BP 138/89   Pulse 95   Temp (!) 97.3 F (36.3 C) (Oral)   Ht 5\' 3"  (1.6 m)   Wt 288 lb 3.2 oz (130.7 kg)   BMI 51.05 kg/m   Wt Readings from Last 3 Encounters:  01/31/17 288 lb 3.2 oz (130.7 kg)  07/26/16 291 lb 9.6 oz (132.3 kg)  06/01/16 293 lb 6.4 oz (133.1 kg)    Gen: NAD, alert, cooperative with exam, NCAT EYES: EOMI, no conjunctival injection, or no icterus ENT:  TMs pearly gray b/l, OP without erythema LYMPH: no cervical LAD CV: NRRR, normal S1/S2, no murmur, distal pulses 2+ b/l Resp: CTABL, no wheezes, normal WOB Ext: No edema, warm Neuro: Alert and oriented, strength equal b/l hand grip MSK: decreased turning head to L, tilting head to left, due to pain on L side of neck Able to put chin to chest ttp posterior L side of neck soft tissues/muscles  Assessment & Plan:  Ishana was seen today for neck pain.  Diagnoses and all orders for this visit:  Neck muscle spasm NSAIDs, rest, ice, heat, gentle ROM neck exercises Can use flexeril prn if trouble getting comfortable at night, #10 tabs given If not improving let me know, return precautions discussed -     cyclobenzaprine (FLEXERIL) 10 MG tablet; Take 1 tablet (10 mg total) by mouth 2  (two) times daily as needed for muscle spasms.   Follow up plan: Return if symptoms worsen or fail to improve. [name], MD [place]

## 2017-02-22 ENCOUNTER — Ambulatory Visit (INDEPENDENT_AMBULATORY_CARE_PROVIDER_SITE_OTHER): Payer: BLUE CROSS/BLUE SHIELD | Admitting: Family Medicine

## 2017-02-22 ENCOUNTER — Encounter: Payer: Self-pay | Admitting: Family Medicine

## 2017-02-22 VITALS — BP 124/83 | HR 93 | Temp 99.2°F | Ht 63.0 in | Wt 287.0 lb

## 2017-02-22 DIAGNOSIS — L03311 Cellulitis of abdominal wall: Secondary | ICD-10-CM

## 2017-02-22 MED ORDER — DOXYCYCLINE HYCLATE 100 MG PO TABS: 100.0000 mg | ORAL_TABLET | Freq: Two times a day (BID) | ORAL | 0 refills | Status: DC

## 2017-02-22 NOTE — Progress Notes (Signed)
BP 124/83   Pulse 93   Temp 99.2 F (37.3 C) (Oral)   Ht 5\' 3"  (1.6 m)   Wt 287 lb (130.2 kg)   BMI 50.84 kg/m    Subjective:    Patient ID: Diamond Marshall, female    DOB: 05-Jan-1971, 46 y.o.   MRN: [id number]  HPI: Diamond Marshall is a 46 y.o. female presenting on 02/22/2017 for a painless red, crusty, draining, lesion on her lower left abdomen x 2 weeks. She states it is starting to become tender today. She reports associated subjective fever and chills. She reports that she woke up with the spot and is concerned it may be a spider bite. [place]. She has used hydrogen peroxide and neosporin without improvement.  She reports a history of emergency C-section and diminished sensation in her lower abdomen. She denies nausea, vomiting, SOB, chest pain or any other symptoms.   HPI   Relevant past medical, surgical, family and social history reviewed and updated as indicated. Interim medical history since our last visit reviewed. Allergies and medications reviewed and updated.  Review of Systems  Constitutional: Positive for chills and fever. Negative for appetite change and diaphoresis.  HENT: Negative for congestion, rhinorrhea, sinus pain, sinus pressure, sneezing and sore throat.   Eyes: Negative for pain and redness.  Respiratory: Negative for cough, choking, shortness of breath and wheezing.   Cardiovascular: Negative for chest pain, palpitations and leg swelling.  Gastrointestinal: Negative for abdominal pain, nausea and vomiting.  Musculoskeletal: Negative for arthralgias, gait problem, myalgias and neck pain.  Skin: Positive for color change and wound (4x4cm red, crusted, lesion on left lower abdomen). Negative for pallor and rash.  Neurological: Negative for dizziness, weakness, light-headedness, numbness and headaches.  Psychiatric/Behavioral: Negative for agitation, behavioral problems and confusion.    Per  HPI unless specifically indicated above     Objective:    BP 124/83   Pulse 93   Temp 99.2 F (37.3 C) (Oral)   Ht 5\' 3"  (1.6 m)   Wt 287 lb (130.2 kg)   BMI 50.84 kg/m   Wt Readings from Last 3 Encounters:  02/22/17 287 lb (130.2 kg)  01/31/17 288 lb 3.2 oz (130.7 kg)  07/26/16 291 lb 9.6 oz (132.3 kg)    Physical Exam  Constitutional: She is oriented to person, place, and time. She appears well-developed and well-nourished. No distress.  HENT:  Head: Normocephalic and atraumatic.  Nose: Nose normal.  Mouth/Throat: Oropharynx is clear and moist. No oropharyngeal exudate.  Eyes: Pupils are equal, round, and reactive to light. Conjunctivae and EOM are normal.  Neck: Normal range of motion. Neck supple. No thyromegaly present.  Cardiovascular: Normal rate, regular rhythm and normal heart sounds.   No murmur heard. Pulmonary/Chest: Effort normal and breath sounds normal. No respiratory distress. She has no wheezes. She has no rales.  Abdominal: Soft. Bowel sounds are normal. She exhibits no distension. There is no tenderness. There is no rebound and no guarding.  Musculoskeletal: Normal range of motion. She exhibits no tenderness.  Neurological: She is alert and oriented to person, place, and time. She has normal reflexes.  Skin: Skin is warm and dry. She is not diaphoretic. Erythema: 4x4 cm lesion on left lower abdomen with crusted, pustular core   Psychiatric: She has a normal mood and affect. Her behavior is normal. Judgment and thought content normal.  Assessment & Plan:   Problem List Items Addressed This Visit    None    Visit Diagnoses    Cellulitis of abdominal wall    -  Primary   Relevant Medications   doxycycline (VIBRA-TABS) 100 MG tablet       Diamond Marshall is a 46 y.o. female presenting on 02/22/2017 for a painless red, crusty, draining, lesion on her lower left abdomen x 2 weeks. On exam she has a 4x4 cm lesion on left lower abdomen with crusted,  pustular core. There is no pocket to drain. This is a cellulitis and I will treat her with 10 days of Doxycyline 100mg . I instructed her to follow up a pus pocket develops that we could drain or if her pain or fever become acutely worse.   Follow up plan: Return if symptoms worsen or fail to improve.  Patient seen and examined with Brock Bad medical student. Agree with assessment and plan above. Patient had erythema around the crusted lesion and signs of cellulitis, sent antibiotic for her. Caryl Pina, MD [facility] 02/22/2017, 2:44 PM

## 2017-05-04 ENCOUNTER — Encounter: Payer: Self-pay | Admitting: Pediatrics

## 2017-05-04 ENCOUNTER — Ambulatory Visit: Payer: BLUE CROSS/BLUE SHIELD | Admitting: Pediatrics

## 2017-05-04 VITALS — BP 133/80 | HR 94 | Temp 98.2°F | Ht 63.0 in | Wt 299.4 lb

## 2017-05-04 DIAGNOSIS — M7050 Other bursitis of knee, unspecified knee: Secondary | ICD-10-CM

## 2017-05-04 NOTE — Progress Notes (Signed)
  Subjective:   Patient ID: Diamond Marshall, female    DOB: 11-Nov-1970, 46 y.o.   MRN: [id number] CC: Knee Pain (Right)  HPI: Diamond Marshall is a 46 y.o. female presenting for Knee Pain (Right)  A few years ago tripped and fell on her right knee, sitting down with her knee folded underneath her Since then has had right knee pain off and on No recent injury but she thinks walking in the snow and needing to be careful has exacerbated her right knee pain Points to upper shin, anterior with where pain is the worst She is not sure she has had swelling in her knee or not No redness Not taking anything for the pain  Relevant past medical, surgical, family and social history reviewed. Allergies and medications reviewed and updated. Social History   Tobacco Use  Smoking Status Never Smoker  Smokeless Tobacco Never Used   ROS: Per HPI   Objective:    BP 133/80   Pulse 94   Temp 98.2 F (36.8 C) (Oral)   Ht 5\' 3"  (1.6 m)   Wt 299 lb 6.4 oz (135.8 kg)   BMI 53.04 kg/m   Wt Readings from Last 3 Encounters:  05/04/17 299 lb 6.4 oz (135.8 kg)  02/22/17 287 lb (130.2 kg)  01/31/17 288 lb 3.2 oz (130.7 kg)    Gen: NAD, alert, cooperative with exam, NCAT EYES: EOMI, no conjunctival injection, or no icterus CV: NRRR, normal S1/S2 Resp: CTABL, no wheezes, normal WOB Abd: +BS, soft, NTND. no guarding or organomegaly Ext: No pitting edema lower legs, warm Neuro: Alert and oriented MSK: Tender along medial right knee joint line and right pedis anserine bursa Difficult to determine effusion given body habitus Pain with flexion greater than 90 degrees right knee, full range of motion left knee, no pain  Assessment & Plan:  Diamond Marshall was seen today for knee pain.  Diagnoses and all orders for this visit:  Pes anserine bursitis Discussed options, patient wants to proceed with injection today Encouraged weight loss, NSAIDs as needed Any worsening let me know  PROCEDURE: Pes anserine bursa  injection Verbal consent was obtained from the patient. Risks including infection explained and contrasted with benefits and alternatives. Patient prepped with betadine. R pes anserine bursa was injected. The patient tolerated the procedure well. No complications.  Injection: 2 cc of Lidocaine 1%, Kenalog 40 mg. Needle: 25 gauge   Follow up plan: Return in about 3 months (around 08/02/2017). [name], MD [place]

## 2017-05-05 MED ORDER — TRIAMCINOLONE ACETONIDE 40 MG/ML IJ SUSP: 40.0000 mg | Freq: Once | INTRAMUSCULAR | Status: DC

## 2017-06-18 ENCOUNTER — Telehealth: Payer: Self-pay

## 2017-06-18 ENCOUNTER — Other Ambulatory Visit: Payer: Self-pay | Admitting: Family Medicine

## 2017-06-18 MED ORDER — OSELTAMIVIR PHOSPHATE 75 MG PO CAPS: 75.0000 mg | ORAL_CAPSULE | Freq: Every day | ORAL | 0 refills | Status: DC

## 2017-06-18 NOTE — Telephone Encounter (Signed)
Patients husband was seen by you Sat and diagnosed with the flu. Wife wants to know if she can have tamiflu sent in for her. Please advise and route to [facility]

## 2017-06-18 NOTE — Telephone Encounter (Signed)
Left detailed message on patients voicemail that med sent to pharmacy 

## 2017-06-18 NOTE — Telephone Encounter (Signed)
I sent in Tamiflu for her.  If she is having symptoms she should take the medicine twice daily for 5 days.  If she is wanting prevention she should take it once daily for 10 days.

## 2017-08-16 ENCOUNTER — Other Ambulatory Visit: Payer: Self-pay | Admitting: Pediatrics

## 2017-08-16 DIAGNOSIS — F32A Depression, unspecified: Secondary | ICD-10-CM

## 2017-08-16 DIAGNOSIS — F419 Anxiety disorder, unspecified: Principal | ICD-10-CM

## 2017-08-16 DIAGNOSIS — F329 Major depressive disorder, single episode, unspecified: Secondary | ICD-10-CM

## 2017-09-24 ENCOUNTER — Other Ambulatory Visit: Payer: Self-pay | Admitting: Pediatrics

## 2017-09-24 DIAGNOSIS — F329 Major depressive disorder, single episode, unspecified: Secondary | ICD-10-CM

## 2017-09-24 DIAGNOSIS — F419 Anxiety disorder, unspecified: Principal | ICD-10-CM

## 2017-09-24 DIAGNOSIS — F32A Depression, unspecified: Secondary | ICD-10-CM

## 2017-10-30 ENCOUNTER — Other Ambulatory Visit: Payer: Self-pay | Admitting: Family Medicine

## 2017-10-30 DIAGNOSIS — F329 Major depressive disorder, single episode, unspecified: Secondary | ICD-10-CM

## 2017-10-30 DIAGNOSIS — F32A Depression, unspecified: Secondary | ICD-10-CM

## 2017-10-30 DIAGNOSIS — F419 Anxiety disorder, unspecified: Principal | ICD-10-CM

## 2017-10-30 NOTE — Telephone Encounter (Signed)
Authorize 30 days only. Then contact the patient letting them know that they will need an appointment before any further prescriptions can be sent in. 

## 2017-11-30 ENCOUNTER — Ambulatory Visit (INDEPENDENT_AMBULATORY_CARE_PROVIDER_SITE_OTHER): Payer: Commercial Managed Care - PPO | Admitting: Family Medicine

## 2017-11-30 ENCOUNTER — Encounter: Payer: Self-pay | Admitting: Family Medicine

## 2017-11-30 VITALS — BP 148/97 | HR 89 | Temp 98.7°F | Ht 63.0 in | Wt 308.1 lb

## 2017-11-30 DIAGNOSIS — H60393 Other infective otitis externa, bilateral: Secondary | ICD-10-CM

## 2017-11-30 DIAGNOSIS — H66009 Acute suppurative otitis media without spontaneous rupture of ear drum, unspecified ear: Secondary | ICD-10-CM

## 2017-11-30 MED ORDER — CIPROFLOXACIN-DEXAMETHASONE 0.3-0.1 % OT SUSP: OTIC | 0 refills | Status: DC

## 2017-11-30 MED ORDER — AMOXICILLIN-POT CLAVULANATE 875-125 MG PO TABS: 1.0000 | ORAL_TABLET | Freq: Two times a day (BID) | ORAL | 0 refills | Status: DC

## 2017-11-30 MED ORDER — BETAMETHASONE SOD PHOS & ACET 6 (3-3) MG/ML IJ SUSP: 6.0000 mg | Freq: Once | INTRAMUSCULAR | Status: DC

## 2017-11-30 NOTE — Progress Notes (Signed)
Chief Complaint  Patient presents with  . Ear Pain    HPI  Patient presents today for  Bilateral ear pain intermittently for 6 months. Wears headset in new job for last 2 months. No fever, chills or sweats. Minimal congestion. No cough. Did feel a bit dizzy getting up off the couch a couple of times last night and the night before. Not vertigo - just off balance.  PMH: Smoking status noted ROS: Per HPI  Objective: BP (!) 148/97   Pulse 89   Temp 98.7 F (37.1 C) (Oral)   Ht 5\' 3"  (1.6 m)   Wt (!) 308 lb 2 oz (139.8 kg)   BMI 54.58 kg/m  Gen: NAD, alert, cooperative with exam HEENT: NCAT, EOMI, PERRL TMs red rimmed with fluid posteriorly. The canals are erythematous. Mildly edematous bilaterally. CV: RRR, good S1/S2, no murmur Resp: CTABL, no wheezes, non-labored Abd: SNTND, BS present, no guarding or organomegaly Ext: No edema, warm Neuro: Alert and oriented, No gross deficits  Assessment and plan:  1. Acute suppurative otitis media without spontaneous rupture of ear drum, recurrence not specified, unspecified laterality   2. Other infective acute otitis externa of both ears     Meds ordered this encounter  Medications  . ciprofloxacin-dexamethasone (CIPRODEX) OTIC suspension    Sig: Place four drops in Affected ear(s) twice daily for one week    Dispense:  7.5 mL    Refill:  0  . amoxicillin-clavulanate (AUGMENTIN) 875-125 MG tablet    Sig: Take 1 tablet by mouth 2 (two) times daily. Take all of this medication    Dispense:  20 tablet    Refill:  0  . betamethasone acetate-betamethasone sodium phosphate (CELESTONE) injection 6 mg      Follow up as needed.  Claretta Fraise, MD

## 2017-12-01 ENCOUNTER — Other Ambulatory Visit: Payer: Self-pay | Admitting: Family Medicine

## 2017-12-01 ENCOUNTER — Telehealth: Payer: Self-pay | Admitting: Family Medicine

## 2017-12-01 MED ORDER — CIPROFLOXACIN-HYDROCORTISONE 0.2-1 % OT SUSP: 3.0000 [drp] | Freq: Two times a day (BID) | OTIC | 0 refills | Status: DC

## 2017-12-01 NOTE — Telephone Encounter (Signed)
I sent in the requested prescription 

## 2017-12-01 NOTE — Telephone Encounter (Signed)
LMOVM new Rx was sent to pharmacy

## 2017-12-03 ENCOUNTER — Telehealth: Payer: Self-pay | Admitting: Family Medicine

## 2017-12-03 MED ORDER — OFLOXACIN 0.3 % OT SOLN: 5.0000 [drp] | Freq: Every day | OTIC | 0 refills | Status: DC

## 2017-12-03 NOTE — Telephone Encounter (Signed)
Ear drops sent to pharmacy

## 2017-12-03 NOTE — Telephone Encounter (Signed)
Please call in ofloxacin

## 2017-12-07 ENCOUNTER — Other Ambulatory Visit: Payer: Self-pay | Admitting: Family Medicine

## 2017-12-07 DIAGNOSIS — F419 Anxiety disorder, unspecified: Principal | ICD-10-CM

## 2017-12-07 DIAGNOSIS — F329 Major depressive disorder, single episode, unspecified: Secondary | ICD-10-CM

## 2017-12-07 DIAGNOSIS — F32A Depression, unspecified: Secondary | ICD-10-CM

## 2017-12-14 ENCOUNTER — Telehealth: Payer: Self-pay | Admitting: Family Medicine

## 2017-12-14 DIAGNOSIS — F419 Anxiety disorder, unspecified: Principal | ICD-10-CM

## 2017-12-14 DIAGNOSIS — F329 Major depressive disorder, single episode, unspecified: Secondary | ICD-10-CM

## 2017-12-14 DIAGNOSIS — F32A Depression, unspecified: Secondary | ICD-10-CM

## 2017-12-14 MED ORDER — ESCITALOPRAM OXALATE 20 MG PO TABS: 20.0000 mg | ORAL_TABLET | Freq: Every day | ORAL | 0 refills | Status: DC

## 2017-12-14 NOTE — Telephone Encounter (Signed)
Left message for patient to call back.  What pharmacy does patient want medication sent to?

## 2017-12-14 NOTE — Telephone Encounter (Signed)
Please refill lexapro for one month

## 2017-12-15 ENCOUNTER — Telehealth: Payer: Self-pay | Admitting: Family Medicine

## 2017-12-15 NOTE — Telephone Encounter (Signed)
Left voicemail that medication was sent in to [facility] on 7/26 and that she needs to call back next week to schedule a follow up appt before she can have any other refills

## 2018-01-02 ENCOUNTER — Encounter: Payer: Self-pay | Admitting: Family Medicine

## 2018-01-02 ENCOUNTER — Ambulatory Visit (INDEPENDENT_AMBULATORY_CARE_PROVIDER_SITE_OTHER): Payer: Commercial Managed Care - PPO | Admitting: Family Medicine

## 2018-01-02 VITALS — BP 112/83 | HR 84 | Temp 98.5°F | Ht 63.0 in | Wt 311.0 lb

## 2018-01-02 DIAGNOSIS — E039 Hypothyroidism, unspecified: Secondary | ICD-10-CM

## 2018-01-02 DIAGNOSIS — E559 Vitamin D deficiency, unspecified: Secondary | ICD-10-CM

## 2018-01-02 DIAGNOSIS — E538 Deficiency of other specified B group vitamins: Secondary | ICD-10-CM | POA: Diagnosis not present

## 2018-01-02 DIAGNOSIS — E8881 Metabolic syndrome: Secondary | ICD-10-CM

## 2018-01-02 DIAGNOSIS — F429 Obsessive-compulsive disorder, unspecified: Secondary | ICD-10-CM

## 2018-01-02 DIAGNOSIS — F419 Anxiety disorder, unspecified: Secondary | ICD-10-CM

## 2018-01-02 DIAGNOSIS — E781 Pure hyperglyceridemia: Secondary | ICD-10-CM

## 2018-01-02 DIAGNOSIS — D519 Vitamin B12 deficiency anemia, unspecified: Secondary | ICD-10-CM

## 2018-01-02 DIAGNOSIS — F329 Major depressive disorder, single episode, unspecified: Secondary | ICD-10-CM

## 2018-01-02 DIAGNOSIS — F32A Depression, unspecified: Secondary | ICD-10-CM

## 2018-01-02 LAB — BAYER DCA HB A1C WAIVED: HB A1C (BAYER DCA - WAIVED): 5.6 % (ref <7.0)

## 2018-01-02 MED ORDER — ESCITALOPRAM OXALATE 20 MG PO TABS: 20.0000 mg | ORAL_TABLET | Freq: Every day | ORAL | 1 refills | Status: DC

## 2018-01-02 NOTE — Patient Instructions (Signed)

## 2018-01-02 NOTE — Progress Notes (Signed)
Subjective:  Patient ID: Diamond Marshall, female    DOB: 1970/06/11  Age: 47 y.o. MRN: [id number]  CC: Medical Management of Chronic Issues   HPI Diamond Marshall presents for patient presents for follow-up on  thyroid. The patient has a history of hypothyroidism for many years. It has been stable recently. Pt. denies any change in  voice, loss of hair, heat or cold intolerance. Energy level has been low. Patient denies constipation and diarrhea. No myxedema. Medication is as noted below. Verified that pt is taking it daily on an empty stomach. Well tolerated.  Patient also has a history of anemia and B12 deficiency as possible reasons for the fatigue.  She has been noted to have high triglycerides as well as elevated blood pressure in the past.  She has been diagnosed with metabolic syndrome and due for recheck of her labs including A1c and triglycerides today.  Depression screen [facility] 2/9 01/02/2018 05/04/2017 02/22/2017  Decreased Interest 0 0 0  Down, Depressed, Hopeless 0 0 0  PHQ - 2 Score 0 0 0  Altered sleeping - - -  Tired, decreased energy - - -  Change in appetite - - -  Feeling bad or failure about yourself  - - -  Trouble concentrating - - -  Moving slowly or fidgety/restless - - -  Suicidal thoughts - - -  PHQ-9 Score - - -  Difficult doing work/chores - - -    History Diamond Marshall has a past medical history of Anemia, Anxiety, Depression, GERD (gastroesophageal reflux disease), and Thyroid disease.   She has a past surgical history that includes Cesarean section (1999) and Dilatation and currettagement.   Her family history includes CVA in her father; Hypertension in her mother.She reports that she has never smoked. She has never used smokeless tobacco. She reports that she does not drink alcohol or use drugs.    ROS Review of Systems  Constitutional: Negative.   HENT: Negative for congestion.   Eyes: Negative for visual disturbance.  Respiratory: Negative for shortness of  breath.   Cardiovascular: Negative for chest pain.  Gastrointestinal: Negative for abdominal pain, constipation, diarrhea, nausea and vomiting.  Genitourinary: Negative for difficulty urinating.  Musculoskeletal: Negative for arthralgias and myalgias.  Neurological: Negative for headaches.  Psychiatric/Behavioral: Negative for sleep disturbance.    Objective:  BP 112/83   Pulse 84   Temp 98.5 F (36.9 C) (Oral)   Ht 5' 3"  (1.6 m)   Wt (!) 311 lb (141.1 kg)   BMI 55.09 kg/m   BP Readings from Last 3 Encounters:  01/02/18 112/83  11/30/17 (!) 148/97  05/04/17 133/80    Wt Readings from Last 3 Encounters:  01/02/18 (!) 311 lb (141.1 kg)  11/30/17 (!) 308 lb 2 oz (139.8 kg)  05/04/17 299 lb 6.4 oz (135.8 kg)     Physical Exam  Constitutional: She is oriented to person, place, and time. She appears well-developed and well-nourished. No distress.  HENT:  Head: Normocephalic and atraumatic.  Right Ear: External ear normal.  Left Ear: External ear normal.  Nose: Nose normal.  Mouth/Throat: Oropharynx is clear and moist.  Eyes: Pupils are equal, round, and reactive to light. Conjunctivae and EOM are normal.  Neck: Normal range of motion. Neck supple. No thyromegaly present.  Cardiovascular: Normal rate, regular rhythm and normal heart sounds.  No murmur heard. Pulmonary/Chest: Effort normal and breath sounds normal. No respiratory distress. She has no wheezes. She has no rales.  Abdominal:  Soft. Bowel sounds are normal. She exhibits no distension. There is no tenderness.  Lymphadenopathy:    She has no cervical adenopathy.  Neurological: She is alert and oriented to person, place, and time. She has normal reflexes.  Skin: Skin is warm and dry.  Psychiatric: She has a normal mood and affect. Her behavior is normal. Judgment and thought content normal.      Assessment & Plan:   Diamond Marshall was seen today for medical management of chronic issues.  Diagnoses and all orders  for this visit:  Hypothyroidism, unspecified type -     CMP14+EGFR -     TSH + free T4  Anemia due to vitamin B12 deficiency, unspecified B12 deficiency type -     CBC with Differential/Platelet -     CMP14+EGFR -     Vitamin B12 -     Folate  B12 deficiency -     CBC with Differential/Platelet -     CMP14+EGFR -     Vitamin B12 -     Folate  Obsessive-compulsive disorder, unspecified type -     OJJ00+XFGH  Metabolic syndrome X -     Bayer DCA Hb A1c Waived -     CMP14+EGFR  Vitamin D deficiency -     VITAMIN D 25 Hydroxy (Vit-D Deficiency, Fractures) -     CMP14+EGFR  Hypertriglyceridemia  Anxiety and depression -     escitalopram (LEXAPRO) 20 MG tablet; Take 1 tablet (20 mg total) by mouth daily.       I have discontinued Diamond Marshall's amoxicillin-clavulanate, ciprofloxacin-hydrocortisone, and ofloxacin. I am also having her maintain her cyanocobalamin, LORazepam, norethindrone, and escitalopram. We will continue to administer triamcinolone acetonide and betamethasone acetate-betamethasone sodium phosphate.  Allergies as of 01/02/2018      Reactions   Phentermine    Became suicidal on the medication   Septra [sulfamethoxazole-trimethoprim] Rash      Medication List        Accurate as of 01/02/18 11:59 PM. Always use your most recent med list.          cyanocobalamin 1000 MCG/ML injection Commonly known as:  (VITAMIN B-12) INJECT 1 ML INTRAMUSCULARLY EVERY 15 DAYS   escitalopram 20 MG tablet Commonly known as:  LEXAPRO Take 1 tablet (20 mg total) by mouth daily.   LORazepam 0.5 MG tablet Commonly known as:  ATIVAN Take 1 tablet (0.5 mg total) by mouth 2 (two) times daily as needed for anxiety.   norethindrone 5 MG tablet Commonly known as:  AYGESTIN norethindrone acetate 5 mg tablet        Follow-up: Return in about 3 months (around 04/04/2018).  Claretta Fraise, M.D.

## 2018-01-03 LAB — CBC WITH DIFFERENTIAL/PLATELET
BASOS: 1 %
Basophils Absolute: 0.1 10*3/uL (ref 0.0–0.2)
EOS (ABSOLUTE): 0.1 10*3/uL (ref 0.0–0.4)
Eos: 1 %
Hematocrit: 37.9 % (ref 34.0–46.6)
Hemoglobin: 12.4 g/dL (ref 11.1–15.9)
Immature Grans (Abs): 0 10*3/uL (ref 0.0–0.1)
Immature Granulocytes: 0 %
LYMPHS ABS: 2.7 10*3/uL (ref 0.7–3.1)
Lymphs: 30 %
MCH: 25.3 pg — AB (ref 26.6–33.0)
MCHC: 32.7 g/dL (ref 31.5–35.7)
MCV: 77 fL — AB (ref 79–97)
MONOS ABS: 0.5 10*3/uL (ref 0.1–0.9)
Monocytes: 6 %
NEUTROS ABS: 5.6 10*3/uL (ref 1.4–7.0)
Neutrophils: 62 %
Platelets: 412 10*3/uL (ref 150–450)
RBC: 4.9 x10E6/uL (ref 3.77–5.28)
RDW: 15.7 % — AB (ref 12.3–15.4)
WBC: 8.9 10*3/uL (ref 3.4–10.8)

## 2018-01-03 LAB — CMP14+EGFR
A/G RATIO: 1.3 (ref 1.2–2.2)
ALT: 9 IU/L (ref 0–32)
AST: 14 IU/L (ref 0–40)
Albumin: 4.1 g/dL (ref 3.5–5.5)
Alkaline Phosphatase: 120 IU/L — ABNORMAL HIGH (ref 39–117)
BILIRUBIN TOTAL: 0.3 mg/dL (ref 0.0–1.2)
BUN/Creatinine Ratio: 14 (ref 9–23)
BUN: 11 mg/dL (ref 6–24)
CHLORIDE: 101 mmol/L (ref 96–106)
CO2: 22 mmol/L (ref 20–29)
Calcium: 9.3 mg/dL (ref 8.7–10.2)
Creatinine, Ser: 0.77 mg/dL (ref 0.57–1.00)
GFR calc non Af Amer: 92 mL/min/{1.73_m2} (ref >59)
GFR, EST AFRICAN AMERICAN: 106 mL/min/{1.73_m2} (ref >59)
Globulin, Total: 3.2 g/dL (ref 1.5–4.5)
Glucose: 83 mg/dL (ref 65–99)
POTASSIUM: 4.4 mmol/L (ref 3.5–5.2)
Sodium: 137 mmol/L (ref 134–144)
TOTAL PROTEIN: 7.3 g/dL (ref 6.0–8.5)

## 2018-01-03 LAB — VITAMIN B12: VITAMIN B 12: 1006 pg/mL (ref 232–1245)

## 2018-01-03 LAB — TSH+FREE T4
Free T4: 0.76 ng/dL — ABNORMAL LOW (ref 0.82–1.77)
TSH: 7.58 u[IU]/mL — ABNORMAL HIGH (ref 0.450–4.500)

## 2018-01-03 LAB — VITAMIN D 25 HYDROXY (VIT D DEFICIENCY, FRACTURES): VIT D 25 HYDROXY: 17.2 ng/mL — AB (ref 30.0–100.0)

## 2018-01-03 LAB — FOLATE: Folate: 6.8 ng/mL (ref >3.0)

## 2018-01-04 ENCOUNTER — Other Ambulatory Visit: Payer: Self-pay | Admitting: *Deleted

## 2018-01-04 ENCOUNTER — Other Ambulatory Visit: Payer: Self-pay | Admitting: Family Medicine

## 2018-01-04 DIAGNOSIS — E039 Hypothyroidism, unspecified: Secondary | ICD-10-CM

## 2018-01-04 MED ORDER — LEVOTHYROXINE SODIUM 50 MCG PO TABS: 50.0000 ug | ORAL_TABLET | Freq: Every day | ORAL | 3 refills | Status: DC

## 2018-01-04 MED ORDER — VITAMIN D (ERGOCALCIFEROL) 1.25 MG (50000 UNIT) PO CAPS: 50000.0000 [IU] | ORAL_CAPSULE | ORAL | 0 refills | Status: DC

## 2018-01-07 ENCOUNTER — Encounter: Payer: Self-pay | Admitting: Family Medicine

## 2018-01-08 ENCOUNTER — Other Ambulatory Visit: Payer: Self-pay | Admitting: Pediatrics

## 2018-02-13 ENCOUNTER — Ambulatory Visit (INDEPENDENT_AMBULATORY_CARE_PROVIDER_SITE_OTHER): Payer: Commercial Managed Care - PPO

## 2018-02-13 DIAGNOSIS — Z23 Encounter for immunization: Secondary | ICD-10-CM | POA: Diagnosis not present

## 2018-02-13 NOTE — Patient Instructions (Signed)

## 2018-02-13 NOTE — Progress Notes (Signed)
Flu vaccine to left deltoid, patient tolerated well

## 2018-02-15 ENCOUNTER — Other Ambulatory Visit (HOSPITAL_COMMUNITY): Payer: Self-pay | Admitting: Oncology

## 2018-02-21 ENCOUNTER — Other Ambulatory Visit: Payer: Self-pay | Admitting: *Deleted

## 2018-02-21 MED ORDER — CYANOCOBALAMIN 1000 MCG/ML IJ SOLN: INTRAMUSCULAR | 0 refills | Status: DC

## 2018-04-24 ENCOUNTER — Ambulatory Visit (INDEPENDENT_AMBULATORY_CARE_PROVIDER_SITE_OTHER): Payer: Commercial Managed Care - PPO

## 2018-04-24 ENCOUNTER — Encounter: Payer: Self-pay | Admitting: Family Medicine

## 2018-04-24 ENCOUNTER — Ambulatory Visit (INDEPENDENT_AMBULATORY_CARE_PROVIDER_SITE_OTHER): Payer: Commercial Managed Care - PPO | Admitting: Family Medicine

## 2018-04-24 VITALS — BP 139/81 | HR 88 | Temp 98.3°F | Ht 63.0 in | Wt 314.6 lb

## 2018-04-24 DIAGNOSIS — M25561 Pain in right knee: Secondary | ICD-10-CM

## 2018-04-24 MED ORDER — DICLOFENAC SODIUM 75 MG PO TBEC: 75.0000 mg | DELAYED_RELEASE_TABLET | Freq: Two times a day (BID) | ORAL | 2 refills | Status: DC

## 2018-04-24 MED ORDER — METHYLPREDNISOLONE 8 MG PO TABS: ORAL_TABLET | ORAL | 0 refills | Status: DC

## 2018-04-24 NOTE — Progress Notes (Signed)
Chief Complaint  Patient presents with  . right knee pain for several days    HPI  Patient presents today for sudden onset of right knee pain upon arising 2 days ago.  She had been walking in stores a lot the day before shopping but suffered no acute injury.  Of note is that she has a remote history of injury to that joint with apparent torn meniscus.  No pain for a significant time until now however.  Currently the pain is at the medial joint line and has pain that is made worse by putting weight on it while fully extended.  It does not hurt to bend when she is not ambulatory.  PMH: Smoking status noted ROS: Per HPI  Objective: BP 139/81   Pulse 88   Temp 98.3 F (36.8 C) (Oral)   Ht 5\' 3"  (1.6 m)   Wt (!) 314 lb 9.6 oz (142.7 kg)   BMI 55.73 kg/m  Gen: NAD, alert, cooperative with exam Ext: No edema, warm.  Right knee is markedly tender at the medial anterior joint line.  McMurray test is very painful.  No definite click.  Drawer sign negative.  Valgus stress exquisitely tender.  Right knee strain Neuro: Alert and oriented, No gross deficits  Assessment and plan:  1. Acute pain of right knee   2. Morbid obesity ([place])     Meds ordered this encounter  Medications  . methylPREDNISolone (MEDROL) 8 MG tablet    Sig: 4 daily for 2 days, then 3 daily for 2 days, then 2 daily for 2 days, then 1 daily for 2 days    Dispense:  20 tablet    Refill:  0  . diclofenac (VOLTAREN) 75 MG EC tablet    Sig: Take 1 tablet (75 mg total) by mouth 2 (two) times daily. For muscle and  Joint pain    Dispense:  60 tablet    Refill:  2    Orders Placed This Encounter  Procedures  . DG Knee 1-2 Views Right    Standing Status:   Future    Number of Occurrences:   1    Standing Expiration Date:   06/24/2019    Order Specific Question:   Reason for Exam (SYMPTOM  OR DIAGNOSIS REQUIRED)    Answer:   right knee pain    Order Specific Question:   Is the patient pregnant?    Answer:   No    Order  Specific Question:   Preferred imaging location?    Answer:   Internal    Follow up as needed.  Claretta Fraise, MD

## 2018-05-23 ENCOUNTER — Ambulatory Visit (INDEPENDENT_AMBULATORY_CARE_PROVIDER_SITE_OTHER): Payer: Commercial Managed Care - PPO | Admitting: Family Medicine

## 2018-05-23 ENCOUNTER — Encounter: Payer: Self-pay | Admitting: Family Medicine

## 2018-05-23 VITALS — BP 142/100 | HR 88 | Temp 98.3°F

## 2018-05-23 DIAGNOSIS — S61011A Laceration without foreign body of right thumb without damage to nail, initial encounter: Secondary | ICD-10-CM | POA: Diagnosis not present

## 2018-05-23 DIAGNOSIS — S61210A Laceration without foreign body of right index finger without damage to nail, initial encounter: Secondary | ICD-10-CM | POA: Diagnosis not present

## 2018-05-23 DIAGNOSIS — Z23 Encounter for immunization: Secondary | ICD-10-CM

## 2018-05-23 NOTE — Progress Notes (Signed)
BP (!) 142/100   Pulse 88   Temp 98.3 F (36.8 C) (Oral)    Subjective:    Patient ID: Diamond Marshall, female    DOB: 1970/07/14, 48 y.o.   MRN: [id number]  HPI: Diamond Marshall is a 48 y.o. female presenting on 05/23/2018 for Extremity Laceration ( R thumb and index finger)   HPI Right thumb and finger laceration Patient comes in with a right thumb and finger laceration that she sustained earlier today when she was washing dishes and a glass broke and she cut both the posterior side of her right thumb and index finger overlying the proximal phalanx bone.  Laceration about 1.5 cm on multiple and superficial patient denies any numbness or weakness or loss of range of motion or feeling or strength in either of those fingers or hand.  Relevant past medical, surgical, family and social history reviewed and updated as indicated. Interim medical history since our last visit reviewed. Allergies and medications reviewed and updated.  Review of Systems  Skin: Positive for wound. Negative for color change.    Per HPI unless specifically indicated above   Allergies as of 05/23/2018      Reactions   Phentermine    Became suicidal on the medication   Septra [sulfamethoxazole-trimethoprim] Rash      Medication List       Accurate as of May 23, 2018  2:34 PM. Always use your most recent med list.        cyanocobalamin 1000 MCG/ML injection Commonly known as:  (VITAMIN B-12) INJECT 1 ML INTRAMUSCULARLY EVERY 15 DAYS   diclofenac 75 MG EC tablet Commonly known as:  VOLTAREN Take 1 tablet (75 mg total) by mouth 2 (two) times daily. For muscle and  Joint pain   escitalopram 20 MG tablet Commonly known as:  LEXAPRO Take 1 tablet (20 mg total) by mouth daily.   levothyroxine 50 MCG tablet Commonly known as:  SYNTHROID, LEVOTHROID Take 1 tablet (50 mcg total) by mouth daily.   LORazepam 0.5 MG tablet Commonly known as:  ATIVAN Take 1 tablet (0.5 mg total) by mouth 2 (two) times  daily as needed for anxiety.   methylPREDNISolone 8 MG tablet Commonly known as:  MEDROL 4 daily for 2 days, then 3 daily for 2 days, then 2 daily for 2 days, then 1 daily for 2 days   norethindrone 5 MG tablet Commonly known as:  AYGESTIN norethindrone acetate 5 mg tablet   Vitamin D (Ergocalciferol) 1.25 MG (50000 UT) Caps capsule Commonly known as:  DRISDOL Take 1 capsule (50,000 Units total) by mouth 2 (two) times a week.          Objective:    BP (!) 142/100   Pulse 88   Temp 98.3 F (36.8 C) (Oral)   Wt Readings from Last 3 Encounters:  04/24/18 (!) 314 lb 9.6 oz (142.7 kg)  01/02/18 (!) 311 lb (141.1 kg)  11/30/17 (!) 308 lb 2 oz (139.8 kg)    Physical Exam Vitals signs and nursing note reviewed.  Skin:    Findings: Laceration (2 lacerations approximately 1.5 cm and superficial on the dorsum of right index finger and right thumb overlying proximal phalanx, irrigated and no foreign body noted) present.     Laceration repair x2: Wound was irrigated with normal saline at pressure. 1% lidocaine with epinephrine was used for local anesthesia, 51mL. 3-0 Vicryl was used to repair the wound.  Placed 4sutures, 2 in each finger.  Wound was approximated well and topical antibiotic was used and then it was covered by 4 x 4 and tape told in place. Procedure was tolerated well     Assessment & Plan:   Problem List Items Addressed This Visit    None    Visit Diagnoses    Laceration of right thumb without foreign body without damage to nail, initial encounter    -  Primary   Laceration of right index finger without foreign body without damage to nail, initial encounter          Patient was instructed to keep wound clean and dry for 48 hours and then she could treat normally and come back in 7 days for suture removal Follow up plan: Return in about 1 week (around 05/30/2018), or if symptoms worsen or fail to improve, for Suture removal.  Counseling provided for all of the  vaccine components Orders Placed This Encounter  Procedures  . Td : Tetanus/diphtheria >7yo Preservative  free    Diamond Pina, MD [facility] 05/23/2018, 2:34 PM

## 2018-05-30 ENCOUNTER — Encounter: Payer: Self-pay | Admitting: Family Medicine

## 2018-05-30 ENCOUNTER — Ambulatory Visit (INDEPENDENT_AMBULATORY_CARE_PROVIDER_SITE_OTHER): Payer: Commercial Managed Care - PPO | Admitting: Family Medicine

## 2018-05-30 VITALS — BP 144/79 | HR 96 | Temp 97.9°F | Ht 63.0 in | Wt 318.6 lb

## 2018-05-30 DIAGNOSIS — S61011D Laceration without foreign body of right thumb without damage to nail, subsequent encounter: Secondary | ICD-10-CM

## 2018-05-30 DIAGNOSIS — S61011A Laceration without foreign body of right thumb without damage to nail, initial encounter: Secondary | ICD-10-CM

## 2018-05-30 DIAGNOSIS — S61210D Laceration without foreign body of right index finger without damage to nail, subsequent encounter: Secondary | ICD-10-CM

## 2018-05-30 DIAGNOSIS — S61210A Laceration without foreign body of right index finger without damage to nail, initial encounter: Secondary | ICD-10-CM

## 2018-05-30 NOTE — Progress Notes (Signed)
   BP (!) 144/79   Pulse 96   Temp 97.9 F (36.6 C) (Oral)   Ht 5\' 3"  (1.6 m)   Wt (!) 318 lb 9.6 oz (144.5 kg)   BMI 56.44 kg/m    Subjective:    Patient ID: Diamond Marshall, female    DOB: 09/11/1970, 48 y.o.   MRN: [id number]  HPI: Diamond Marshall is a 48 y.o. female presenting on 05/30/2018 for Suture / Staple Removal (Right thumb and pointer)   HPI Follow-up for laceration on thumb and finger Patient is coming in today for follow-up on laceration on thumb and finger that she sustained 1 week ago while washing the dishes.  There is no sign of infection or purulent discharge.  Patient is still little sore around both sites and has a little swelling around both sides but denies any fevers or chills or numbness or weakness.  Relevant past medical, surgical, family and social history reviewed and updated as indicated. Interim medical history since our last visit reviewed. Allergies and medications reviewed and updated.  Review of Systems  Skin: Positive for wound. Negative for color change and rash.    Per HPI unless specifically indicated above     Objective:    BP (!) 144/79   Pulse 96   Temp 97.9 F (36.6 C) (Oral)   Ht 5\' 3"  (1.6 m)   Wt (!) 318 lb 9.6 oz (144.5 kg)   BMI 56.44 kg/m   Wt Readings from Last 3 Encounters:  05/30/18 (!) 318 lb 9.6 oz (144.5 kg)  04/24/18 (!) 314 lb 9.6 oz (142.7 kg)  01/02/18 (!) 311 lb (141.1 kg)    Physical Exam Vitals signs and nursing note reviewed.  Skin:    Findings: Wound present. No erythema or laceration.        Assessment & Plan:   Problem List Items Addressed This Visit    None    Visit Diagnoses    Laceration of right thumb without foreign body without damage to nail, initial encounter    -  Primary   Laceration of right index finger without foreign body without damage to nail, initial encounter          Removed 2 sutures from each laceration and the thumb did have a little separation, place a Steri-Strip and  Dermabond on both just to hold together and instructed her to continue to keep it mostly warm and clean and return if any signs of infection develop and to let the Steri-Strips and Dermabond fall off on their own. Follow up plan: Return if symptoms worsen or fail to improve.  Counseling provided for all of the vaccine components No orders of the defined types were placed in this encounter.   Caryl Pina, MD [facility] 05/30/2018, 9:16 AM

## 2018-06-17 ENCOUNTER — Encounter: Payer: Self-pay | Admitting: Family Medicine

## 2018-06-17 ENCOUNTER — Ambulatory Visit (INDEPENDENT_AMBULATORY_CARE_PROVIDER_SITE_OTHER): Payer: Commercial Managed Care - PPO | Admitting: Family Medicine

## 2018-06-17 VITALS — BP 135/84 | HR 79 | Temp 97.6°F | Ht 63.0 in | Wt 317.4 lb

## 2018-06-17 DIAGNOSIS — G44031 Episodic paroxysmal hemicrania, intractable: Secondary | ICD-10-CM | POA: Diagnosis not present

## 2018-06-17 DIAGNOSIS — R55 Syncope and collapse: Secondary | ICD-10-CM

## 2018-06-17 MED ORDER — TOPIRAMATE 25 MG PO TABS: ORAL_TABLET | ORAL | 0 refills | Status: DC

## 2018-06-17 NOTE — Progress Notes (Signed)
Subjective:  Patient ID: Diamond PRIEN, female    DOB: 04/23/1971  Age: 48 y.o. MRN: [id number]  CC: Headache (pt here today c/o an episode where she became "foggy headed", couldn't think clearly, blurred vision, bright lights caused severe headache and dizziness. She thinks she had a migraine.)   HPI Diamond Marshall presents for onset 9 days ago for acute spell obtundation followed by headache.  First she notes that she was walking into work and could not recall names of coworkers.  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  Subsequently she developed headache on the right side of her forehead.  She felt foggy so she got up and went to the bathroom.  She was sitting in the dark as part of her work in front of a computer screen when she walked in the bathroom and turn the lights on the bright light struck her and she passed out.  She was out for a short time and awoke with a severe headache.  She was seen by EMS she was sent home and she slept for 16 hours at the end of which time the symptoms resolved.  There have been some minor headaches just twinges of pain over the brows noted since that time but no serious headaches.  She has had 2 previous episodes of similar occurrence in the past but none as severe.  One was 2 years ago.  The worst one prior to that was 19 years ago when she was pregnant with her daughter.  Depression screen [facility] 2/9 06/17/2018 05/30/2018 04/24/2018  Decreased Interest 1 0 0  Down, Depressed, Hopeless 2 0 0  PHQ - 2 Score 3 0 0  Altered sleeping 1 - -  Tired, decreased energy 2 - -  Change in appetite 2 - -  Feeling bad or failure about yourself  2 - -  Trouble concentrating 1 - -  Moving slowly or fidgety/restless 2 - -  Suicidal thoughts 0 - -  PHQ-9 Score 13 - -  Difficult doing work/chores - - -     History Diamond Marshall has a past medical history of Anemia, Anxiety, Depression, GERD (gastroesophageal reflux disease), and Thyroid disease.   She has a past surgical history that includes Cesarean section (1999) and Dilatation and currettagement.   Her family history includes CVA in her father; Hypertension in her mother.She reports that she has never smoked. She has never used smokeless tobacco. She reports that she does not drink alcohol or use drugs.    ROS Review of Systems  Objective:  BP 135/84   Pulse 79   Temp 97.6 F (36.4 C) (Oral)   Ht 5' 3" (1.6 m)   Wt (!) 317 lb 6 oz (144 kg)   BMI 56.22 kg/m   BP Readings from Last 3 Encounters:  06/17/18 135/84  05/30/18 (!) 144/79  05/23/18 (!) 142/100    Wt Readings from Last 3 Encounters:  06/17/18 (!) 317 lb 6 oz (144 kg)  05/30/18 (!) 318 lb 9.6 oz (144.5 kg)  04/24/18 (!) 314 lb 9.6 oz (142.7 kg)     Physical Exam Constitutional:      General: She is not in acute distress.    Appearance: She is well-developed.  HENT:     Head: Normocephalic and atraumatic.  Right Ear: External ear normal.     Left Ear: External ear normal.     Nose: Nose normal.  Eyes:     Conjunctiva/sclera: Conjunctivae normal.     Pupils: Pupils are equal, round, and reactive to light.  Neck:     Musculoskeletal: Normal range of motion and neck supple.     Thyroid: No thyromegaly.  Cardiovascular:     Rate and Rhythm: Normal rate and regular rhythm.     Heart sounds: Normal heart sounds. No murmur.  Pulmonary:     Effort: Pulmonary effort is normal. No respiratory distress.     Breath sounds: Normal breath sounds. No wheezing or rales.  Abdominal:     General: Bowel sounds are normal. There is no distension.     Palpations: Abdomen is soft.     Tenderness: There is no abdominal tenderness.  Lymphadenopathy:     Cervical: No cervical adenopathy.  Skin:    General: Skin is warm and dry.  Neurological:     Mental Status: She  is alert and oriented to person, place, and time. Mental status is at baseline.     Cranial Nerves: No cranial nerve deficit or dysarthria.     Sensory: No sensory deficit.     Motor: No weakness.     Coordination: Romberg sign negative. Coordination normal.     Gait: Gait normal.     Deep Tendon Reflexes: Reflexes are normal and symmetric.  Psychiatric:        Mood and Affect: Mood normal.        Speech: Speech normal.        Behavior: Behavior normal.        Thought Content: Thought content normal.        Cognition and Memory: Cognition is not impaired.        Judgment: Judgment normal.       Assessment & Plan:   Diamond Marshall was seen today for headache.  Diagnoses and all orders for this visit:  Syncope, unspecified syncope type -     CBC with Differential/Platelet -     CMP14+EGFR -     TSH  Intractable episodic paroxysmal hemicrania -     MR Brain W Wo Contrast; Future  Other orders -     topiramate (TOPAMAX) 25 MG tablet; 1 nightly times 1 week.  Then 2 nightly for 1 week.  Then 3 nightly for 1 week.  Then 4 nightly.       I have discontinued Diamond Marshall methylPREDNISolone. I am also having her start on topiramate. Additionally, I am having her maintain her LORazepam, norethindrone, escitalopram, Vitamin D (Ergocalciferol), levothyroxine, cyanocobalamin, and diclofenac. We will continue to administer triamcinolone acetonide and betamethasone acetate-betamethasone sodium phosphate.  Allergies as of 06/17/2018      Reactions   Phentermine    Became suicidal on the medication   Septra [sulfamethoxazole-trimethoprim] Rash      Medication List       Accurate as of June 17, 2018  1:44 PM. Always use your most recent med list.        cyanocobalamin 1000 MCG/ML injection Commonly known as:  (VITAMIN B-12) INJECT 1 ML INTRAMUSCULARLY EVERY 15 DAYS   diclofenac 75 MG EC tablet Commonly known as:  VOLTAREN Take 1 tablet (75 mg total) by mouth 2 (two) times  daily. For muscle and  Joint pain   escitalopram 20 MG tablet Commonly known as:  LEXAPRO Take 1 tablet (20 mg total)  by mouth daily.   levothyroxine 50 MCG tablet Commonly known as:  SYNTHROID, LEVOTHROID Take 1 tablet (50 mcg total) by mouth daily.   LORazepam 0.5 MG tablet Commonly known as:  ATIVAN Take 1 tablet (0.5 mg total) by mouth 2 (two) times daily as needed for anxiety.   norethindrone 5 MG tablet Commonly known as:  AYGESTIN norethindrone acetate 5 mg tablet   topiramate 25 MG tablet Commonly known as:  TOPAMAX 1 nightly times 1 week.  Then 2 nightly for 1 week.  Then 3 nightly for 1 week.  Then 4 nightly.   Vitamin D (Ergocalciferol) 1.25 MG (50000 UT) Caps capsule Commonly known as:  DRISDOL Take 1 capsule (50,000 Units total) by mouth 2 (two) times a week.        Follow-up: Return in about 1 month (around 07/18/2018), or if symptoms worsen or fail to improve.  Claretta Fraise, M.D.

## 2018-06-18 LAB — CBC WITH DIFFERENTIAL/PLATELET
Basophils Absolute: 0.1 10*3/uL (ref 0.0–0.2)
Basos: 1 %
EOS (ABSOLUTE): 0.2 10*3/uL (ref 0.0–0.4)
Eos: 3 %
Hematocrit: 36.9 % (ref 34.0–46.6)
Hemoglobin: 11.8 g/dL (ref 11.1–15.9)
Immature Grans (Abs): 0 10*3/uL (ref 0.0–0.1)
Immature Granulocytes: 0 %
LYMPHS: 31 %
Lymphocytes Absolute: 2.3 10*3/uL (ref 0.7–3.1)
MCH: 24.9 pg — ABNORMAL LOW (ref 26.6–33.0)
MCHC: 32 g/dL (ref 31.5–35.7)
MCV: 78 fL — ABNORMAL LOW (ref 79–97)
MONOS ABS: 0.4 10*3/uL (ref 0.1–0.9)
Monocytes: 6 %
NEUTROS PCT: 59 %
Neutrophils Absolute: 4.5 10*3/uL (ref 1.4–7.0)
Platelets: 327 10*3/uL (ref 150–450)
RBC: 4.73 x10E6/uL (ref 3.77–5.28)
RDW: 14.7 % (ref 11.7–15.4)
WBC: 7.4 10*3/uL (ref 3.4–10.8)

## 2018-06-18 LAB — CMP14+EGFR
ALT: 15 IU/L (ref 0–32)
AST: 14 IU/L (ref 0–40)
Albumin/Globulin Ratio: 1.5 (ref 1.2–2.2)
Albumin: 4 g/dL (ref 3.8–4.8)
Alkaline Phosphatase: 104 IU/L (ref 39–117)
BUN/Creatinine Ratio: 12 (ref 9–23)
BUN: 9 mg/dL (ref 6–24)
Bilirubin Total: <0.2 mg/dL (ref 0.0–1.2)
CO2: 24 mmol/L (ref 20–29)
Calcium: 9.3 mg/dL (ref 8.7–10.2)
Chloride: 101 mmol/L (ref 96–106)
Creatinine, Ser: 0.74 mg/dL (ref 0.57–1.00)
GFR calc Af Amer: 112 mL/min/{1.73_m2} (ref >59)
GFR, EST NON AFRICAN AMERICAN: 97 mL/min/{1.73_m2} (ref >59)
Globulin, Total: 2.6 g/dL (ref 1.5–4.5)
Glucose: 92 mg/dL (ref 65–99)
Potassium: 4.7 mmol/L (ref 3.5–5.2)
Sodium: 139 mmol/L (ref 134–144)
Total Protein: 6.6 g/dL (ref 6.0–8.5)

## 2018-06-18 LAB — TSH: TSH: 3.6 u[IU]/mL (ref 0.450–4.500)

## 2018-06-19 ENCOUNTER — Other Ambulatory Visit: Payer: Self-pay | Admitting: *Deleted

## 2018-06-19 DIAGNOSIS — R55 Syncope and collapse: Secondary | ICD-10-CM

## 2018-06-19 DIAGNOSIS — D519 Vitamin B12 deficiency anemia, unspecified: Secondary | ICD-10-CM

## 2018-06-21 LAB — SPECIMEN STATUS REPORT

## 2018-06-21 LAB — FERRITIN: Ferritin: 53 ng/mL (ref 15–150)

## 2018-06-21 LAB — IRON AND TIBC
Iron Saturation: 10 % — ABNORMAL LOW (ref 15–55)
Iron: 31 ug/dL (ref 27–159)
Total Iron Binding Capacity: 305 ug/dL (ref 250–450)
UIBC: 274 ug/dL (ref 131–425)

## 2018-06-21 NOTE — Progress Notes (Signed)
Hello Felicie,  Your lab result is normal.Some minor variations that are not significant are commonly marked abnormal, but do not represent any medical problem for you.  Best regards, Claretta Fraise, M.D.

## 2018-06-26 ENCOUNTER — Ambulatory Visit (HOSPITAL_COMMUNITY)
Admission: RE | Admit: 2018-06-26 | Discharge: 2018-06-26 | Disposition: A | Discharge status: Home / Self Care | Payer: Commercial Managed Care - PPO | Source: Ambulatory Visit | Attending: Family Medicine | Admitting: Family Medicine

## 2018-06-26 ENCOUNTER — Encounter (HOSPITAL_COMMUNITY): Payer: Self-pay

## 2018-06-26 ENCOUNTER — Telehealth: Payer: Self-pay | Admitting: Family Medicine

## 2018-06-26 ENCOUNTER — Other Ambulatory Visit: Payer: Self-pay

## 2018-06-26 DIAGNOSIS — G44031 Episodic paroxysmal hemicrania, intractable: Secondary | ICD-10-CM

## 2018-07-01 ENCOUNTER — Telehealth: Payer: Self-pay | Admitting: Family Medicine

## 2018-07-01 ENCOUNTER — Other Ambulatory Visit: Payer: Self-pay | Admitting: Family Medicine

## 2018-07-01 MED ORDER — ALPRAZOLAM 1 MG PO TABS: ORAL_TABLET | ORAL | 0 refills | Status: DC

## 2018-07-01 NOTE — Telephone Encounter (Signed)
I sent in the requested prescription 

## 2018-07-01 NOTE — Telephone Encounter (Signed)
Patient aware.

## 2018-07-05 ENCOUNTER — Ambulatory Visit: Payer: Commercial Managed Care - PPO | Admitting: Family Medicine

## 2018-07-06 ENCOUNTER — Ambulatory Visit
Admission: RE | Admit: 2018-07-06 | Discharge: 2018-07-06 | Disposition: A | Discharge status: Home / Self Care | Payer: Commercial Managed Care - PPO | Source: Ambulatory Visit | Attending: Family Medicine | Admitting: Family Medicine

## 2018-07-06 DIAGNOSIS — G44031 Episodic paroxysmal hemicrania, intractable: Secondary | ICD-10-CM

## 2018-07-06 MED ORDER — GADOBENATE DIMEGLUMINE 529 MG/ML IV SOLN
20.0000 mL | Freq: Once | INTRAVENOUS | Status: AC | PRN
  Administered 2018-07-06: 20 mL via INTRAVENOUS

## 2018-07-19 ENCOUNTER — Encounter: Payer: Self-pay | Admitting: Family Medicine

## 2018-07-19 ENCOUNTER — Ambulatory Visit (INDEPENDENT_AMBULATORY_CARE_PROVIDER_SITE_OTHER): Payer: Commercial Managed Care - PPO | Admitting: Family Medicine

## 2018-07-19 VITALS — BP 125/82 | HR 80 | Temp 97.4°F | Ht 63.0 in | Wt 314.1 lb

## 2018-07-19 DIAGNOSIS — G44031 Episodic paroxysmal hemicrania, intractable: Secondary | ICD-10-CM

## 2018-07-19 MED ORDER — RIZATRIPTAN BENZOATE 10 MG PO TBDP: 10.0000 mg | ORAL_TABLET | ORAL | 0 refills | Status: DC | PRN

## 2018-07-19 NOTE — Progress Notes (Signed)
Subjective:  Patient ID: Diamond Marshall, female    DOB: 09/10/1970  Age: 48 y.o. MRN: [id number]  CC: Medical Management of Chronic Issues (pt here today for 1 month follow up of her migraines, she had MRI and wasn't able to take Topamax due to dizziness)   Diamond Marshall presents for recheck of migraine. Only having about one a month. Could not tolerate the topiramate dueto severe dizziness. No recurrence since last episode at last appointment.  Depression screen [facility] 2/9 07/19/2018 06/17/2018 05/30/2018  Decreased Interest 1 1 0  Down, Depressed, Hopeless 2 2 0  PHQ - 2 Score 3 3 0  Altered sleeping 1 1 -  Tired, decreased energy 2 2 -  Change in appetite 2 2 -  Feeling bad or failure about yourself  2 2 -  Trouble concentrating 1 1 -  Moving slowly or fidgety/restless 2 2 -  Suicidal thoughts 0 0 -  PHQ-9 Score 13 13 -  Difficult doing work/chores - - -    History Diamond Marshall has a past medical history of Anemia, Anxiety, Depression, GERD (gastroesophageal reflux disease), and Thyroid disease.   She has a past surgical history that includes Cesarean section (1999) and Dilatation and currettagement.   Her family history includes CVA in her father; Hypertension in her mother.She reports that she has never smoked. She has never used smokeless tobacco. She reports that she does not drink alcohol or use drugs.    ROS Review of Systems  Constitutional: Negative.   HENT: Negative.   Eyes: Negative for visual disturbance.  Respiratory: Negative for shortness of breath.   Cardiovascular: Negative for chest pain.  Gastrointestinal: Negative for abdominal pain.  Musculoskeletal: Negative for arthralgias.    Objective:  BP 125/82   Pulse 80   Temp (!) 97.4 F (36.3 C) (Oral)   Ht 5\' 3"  (1.6 m)   Wt (!) 314 lb 2 oz (142.5 kg)   BMI 55.64 kg/m   BP Readings from Last 3 Encounters:  07/19/18 125/82  06/17/18 135/84  05/30/18 (!) 144/79    Wt Readings from Last 3 Encounters:   07/19/18 (!) 314 lb 2 oz (142.5 kg)  06/17/18 (!) 317 lb 6 oz (144 kg)  05/30/18 (!) 318 lb 9.6 oz (144.5 kg)     Physical Exam Constitutional:      General: She is not in acute distress.    Appearance: She is well-developed.  Cardiovascular:     Rate and Rhythm: Normal rate and regular rhythm.  Pulmonary:     Breath sounds: Normal breath sounds.  Skin:    General: Skin is warm and dry.  Neurological:     Mental Status: She is alert and oriented to person, place, and time.       Assessment & Plan:   Diamond Marshall was seen today for medical management of chronic issues.  Diagnoses and all orders for this visit:  Intractable episodic paroxysmal hemicrania  Other orders -     rizatriptan (MAXALT-MLT) 10 MG disintegrating tablet; Take 1 tablet (10 mg total) by mouth as needed for migraine. May repeat in 2 hours if needed       I have discontinued [employer] topiramate. I am also having her start on rizatriptan. Additionally, I am having her maintain her LORazepam, norethindrone, escitalopram, Vitamin D (Ergocalciferol), levothyroxine, cyanocobalamin, diclofenac, and ALPRAZolam. We will continue to administer triamcinolone acetonide and betamethasone acetate-betamethasone sodium phosphate.  Allergies as of 07/19/2018  Reactions   Phentermine    Became suicidal on the medication   Septra [sulfamethoxazole-trimethoprim] Rash      Medication List       Accurate as of July 19, 2018 11:59 PM. Always use your most recent med list.        ALPRAZolam 1 MG tablet Commonly known as:  XANAX Take 1 p.o. x1, 1/2 to 1-hour before procedure   cyanocobalamin 1000 MCG/ML injection Commonly known as:  (VITAMIN B-12) INJECT 1 ML INTRAMUSCULARLY EVERY 15 DAYS   diclofenac 75 MG EC tablet Commonly known as:  VOLTAREN Take 1 tablet (75 mg total) by mouth 2 (two) times daily. For muscle and  Joint pain   escitalopram 20 MG tablet Commonly known as:  LEXAPRO Take 1  tablet (20 mg total) by mouth daily.   levothyroxine 50 MCG tablet Commonly known as:  SYNTHROID, LEVOTHROID Take 1 tablet (50 mcg total) by mouth daily.   LORazepam 0.5 MG tablet Commonly known as:  ATIVAN Take 1 tablet (0.5 mg total) by mouth 2 (two) times daily as needed for anxiety.   norethindrone 5 MG tablet Commonly known as:  AYGESTIN norethindrone acetate 5 mg tablet   rizatriptan 10 MG disintegrating tablet Commonly known as:  MAXALT-MLT Take 1 tablet (10 mg total) by mouth as needed for migraine. May repeat in 2 hours if needed   Vitamin D (Ergocalciferol) 1.25 MG (50000 UT) Caps capsule Commonly known as:  DRISDOL Take 1 capsule (50,000 Units total) by mouth 2 (two) times a week.        Follow-up: Return in about 6 months (around 01/17/2019).  Claretta Fraise, M.D.

## 2018-07-21 ENCOUNTER — Encounter: Payer: Self-pay | Admitting: Family Medicine

## 2018-08-09 ENCOUNTER — Other Ambulatory Visit: Payer: Self-pay | Admitting: Family Medicine

## 2018-08-14 ENCOUNTER — Ambulatory Visit (INDEPENDENT_AMBULATORY_CARE_PROVIDER_SITE_OTHER): Payer: Commercial Managed Care - PPO | Admitting: Family Medicine

## 2018-08-14 ENCOUNTER — Encounter: Payer: Self-pay | Admitting: Family Medicine

## 2018-08-14 ENCOUNTER — Other Ambulatory Visit: Payer: Self-pay

## 2018-08-14 VITALS — BP 114/59 | HR 77 | Temp 97.3°F | Ht 63.0 in | Wt 318.4 lb

## 2018-08-14 DIAGNOSIS — L821 Other seborrheic keratosis: Secondary | ICD-10-CM

## 2018-08-14 DIAGNOSIS — D485 Neoplasm of uncertain behavior of skin: Secondary | ICD-10-CM

## 2018-08-14 NOTE — Progress Notes (Signed)
Chief Complaint  Patient presents with  . Nevus    pt here today to have mole on left side looked at    [facility]  Patient presents today for examination of a mole.  This is located at the left flank.  It felt a little rough.  She realized the color was not quite right for melanoma.  However, she is very light complected redheaded and cannot tolerate the sun.  Because of this she felt that she should be checked.  It was first noted within the last few days.  PMH: Smoking status noted ROS: Per HPI  Objective: BP (!) 114/59   Pulse 77   Temp (!) 97.3 F (36.3 C) (Oral)   Ht 5\' 3"  (1.6 m)   Wt (!) 318 lb 6 oz (144.4 kg)   BMI 56.40 kg/m  Gen: NAD, alert, cooperative with exam HEENT: NCAT, EOMI, PERRL Skin: There is a 2 x 6 mm reddish-brown well-circumscribed lesion at the flank just above the left hip.  This has a bit of a rough irregular surface. neuro: Alert and oriented, No gross deficits  Assessment and plan:  1. Neoplasm of uncertain behavior of skin   2. Seborrheic keratoses     No orders of the defined types were placed in this encounter.   No orders of the defined types were placed in this encounter.   Follow up as needed.  Claretta Fraise, MD

## 2018-09-24 ENCOUNTER — Ambulatory Visit (INDEPENDENT_AMBULATORY_CARE_PROVIDER_SITE_OTHER): Payer: Commercial Managed Care - PPO | Admitting: Nurse Practitioner

## 2018-09-24 ENCOUNTER — Other Ambulatory Visit: Payer: Self-pay

## 2018-09-24 ENCOUNTER — Encounter: Payer: Self-pay | Admitting: Nurse Practitioner

## 2018-09-24 VITALS — BP 149/100 | HR 82 | Temp 97.8°F | Ht 63.0 in | Wt 324.0 lb

## 2018-09-24 DIAGNOSIS — N644 Mastodynia: Secondary | ICD-10-CM | POA: Diagnosis not present

## 2018-09-24 DIAGNOSIS — N6310 Unspecified lump in the right breast, unspecified quadrant: Secondary | ICD-10-CM | POA: Diagnosis not present

## 2018-09-24 NOTE — Patient Instructions (Signed)

## 2018-09-24 NOTE — Progress Notes (Signed)
   Subjective:    Patient ID: Diamond Marshall, female    DOB: 1971-05-20, 48 y.o.   MRN: [id number]   Chief Complaint: Lump in right breast   HPI Patient comes in today c/o lump in right breast. She found it a few days ago, but she says that area has been sore for awhile.    Review of Systems  Constitutional: Negative.   Respiratory: Negative.   Cardiovascular: Negative.   Genitourinary: Negative.   Neurological: Negative.   Psychiatric/Behavioral: Negative.   All other systems reviewed and are negative.      Objective:   Physical Exam Constitutional:      Appearance: Normal appearance.  Cardiovascular:     Rate and Rhythm: Normal rate and regular rhythm.     Heart sounds: Normal heart sounds.  Pulmonary:     Breath sounds: Normal breath sounds.  Chest:     Breasts:        Right: Tenderness present. No swelling, bleeding, inverted nipple, mass, nipple discharge or skin change.        Left: Tenderness present. No swelling, bleeding, inverted nipple, mass, nipple discharge or skin change.  Lymphadenopathy:     Upper Body:     Right upper body: No supraclavicular, axillary or pectoral adenopathy.     Left upper body: No supraclavicular, axillary or pectoral adenopathy.  Skin:    General: Skin is warm and dry.  Neurological:     General: No focal deficit present.     Mental Status: She is alert and oriented to person, place, and time.     BP (!) 149/100   Pulse 82   Temp 97.8 F (36.6 C) (Oral)   Ht 5\' 3"  (1.6 m)   Wt (!) 324 lb (147 kg)   BMI 57.39 kg/m        Assessment & Plan:  Diamond Marshall in today with chief complaint of Lump in right breast   1. Painful lumpy right breast - MM DIAG BREAST TOMO BILATERAL; Future  2. Breast tenderness in female - MM DIAG BREAST TOMO BILATERAL; Future Continue to watch areas until mammogram can be done Report any changes as soon as possible  Mary-Margaret Hassell Done, FNP

## 2018-10-22 ENCOUNTER — Other Ambulatory Visit: Payer: Self-pay | Admitting: Family Medicine

## 2018-10-22 DIAGNOSIS — F32A Depression, unspecified: Secondary | ICD-10-CM

## 2018-10-22 DIAGNOSIS — F329 Major depressive disorder, single episode, unspecified: Secondary | ICD-10-CM

## 2018-10-22 DIAGNOSIS — F419 Anxiety disorder, unspecified: Secondary | ICD-10-CM

## 2018-11-08 ENCOUNTER — Ambulatory Visit (INDEPENDENT_AMBULATORY_CARE_PROVIDER_SITE_OTHER): Payer: Commercial Managed Care - PPO | Admitting: Family Medicine

## 2018-11-08 ENCOUNTER — Other Ambulatory Visit: Payer: Self-pay

## 2018-11-08 DIAGNOSIS — J020 Streptococcal pharyngitis: Secondary | ICD-10-CM

## 2018-11-08 MED ORDER — AMOXICILLIN 875 MG PO TABS: 875.0000 mg | ORAL_TABLET | Freq: Two times a day (BID) | ORAL | 0 refills | Status: AC

## 2018-11-08 NOTE — Progress Notes (Signed)
Subjective:    Patient ID: Diamond Marshall, female    DOB: 22-Jul-1970, 48 y.o.   MRN: [id number]   HPI: Diamond Marshall is a 48 y.o. female presenting for swollen tonsils. Trouble swallowing. No other sx. No fever. Temp 98.7 Occurs about once a year. Onset 4 days ago.   Depression screen [facility] 2/9 09/24/2018 07/19/2018 06/17/2018 05/30/2018 04/24/2018  Decreased Interest 0 1 1 0 0  Down, Depressed, Hopeless 0 2 2 0 0  PHQ - 2 Score 0 3 3 0 0  Altered sleeping - 1 1 - -  Tired, decreased energy - 2 2 - -  Change in appetite - 2 2 - -  Feeling bad or failure about yourself  - 2 2 - -  Trouble concentrating - 1 1 - -  Moving slowly or fidgety/restless - 2 2 - -  Suicidal thoughts - 0 0 - -  PHQ-9 Score - 13 13 - -  Difficult doing work/chores - - - - -     Relevant past medical, surgical, family and social history reviewed and updated as indicated.  Interim medical history since our last visit reviewed. Allergies and medications reviewed and updated.  ROS:  Review of Systems  Constitutional: Negative for appetite change, chills, diaphoresis, fatigue and fever.  HENT: Positive for sore throat. Negative for congestion, ear pain, hearing loss, postnasal drip, rhinorrhea and trouble swallowing.   Respiratory: Negative for cough, chest tightness and shortness of breath.   Cardiovascular: Negative for chest pain and palpitations.  Gastrointestinal: Negative for abdominal pain.  Musculoskeletal: Negative for arthralgias.  Skin: Negative for rash.     Social History   Tobacco Use  Smoking Status Never Smoker  Smokeless Tobacco Never Used       Objective:     Wt Readings from Last 3 Encounters:  09/24/18 (!) 324 lb (147 kg)  08/14/18 (!) 318 lb 6 oz (144.4 kg)  07/19/18 (!) 314 lb 2 oz (142.5 kg)     Exam deferred. Pt. Harboring due to COVID 19. Phone visit performed.   Assessment & Plan:   1. Strep pharyngitis     Meds ordered this encounter  Medications  . amoxicillin  (AMOXIL) 875 MG tablet    Sig: Take 1 tablet (875 mg total) by mouth 2 (two) times daily for 10 days.    Dispense:  20 tablet    Refill:  0    No orders of the defined types were placed in this encounter.     Diagnoses and all orders for this visit:  Strep pharyngitis  Other orders -     amoxicillin (AMOXIL) 875 MG tablet; Take 1 tablet (875 mg total) by mouth 2 (two) times daily for 10 days.    Virtual Visit via telephone Note  I discussed the limitations, risks, security and privacy concerns of performing an evaluation and management service by telephone and the availability of in person appointments. The patient was identified with two identifiers. Pt.expressed understanding and agreed to proceed. Pt. Is at home. Dr. Livia Snellen is in his office.  Follow Up Instructions:   I discussed the assessment and treatment plan with the patient. The patient was provided an opportunity to ask questions and all were answered. The patient agreed with the plan and demonstrated an understanding of the instructions.   The patient was advised to call back or seek an in-person evaluation if the symptoms worsen or if the condition fails to improve as anticipated.  Total minutes including chart review and phone contact time: 9   Follow up plan: Return if symptoms worsen or fail to improve.  Diamond Fraise, MD [place]

## 2018-11-12 ENCOUNTER — Telehealth: Payer: Self-pay | Admitting: Family Medicine

## 2018-11-12 ENCOUNTER — Encounter: Payer: Self-pay | Admitting: Family Medicine

## 2018-11-12 ENCOUNTER — Other Ambulatory Visit: Payer: Self-pay | Admitting: Family Medicine

## 2018-11-12 MED ORDER — FLUCONAZOLE 150 MG PO TABS: 150.0000 mg | ORAL_TABLET | Freq: Once | ORAL | 0 refills | Status: AC

## 2018-11-12 NOTE — Telephone Encounter (Signed)
I sent in the requested prescription 

## 2018-11-12 NOTE — Telephone Encounter (Signed)
Pt aware.

## 2019-02-17 ENCOUNTER — Other Ambulatory Visit: Payer: Self-pay | Admitting: Family Medicine

## 2019-02-18 ENCOUNTER — Other Ambulatory Visit: Payer: Self-pay | Admitting: Family Medicine

## 2019-03-12 ENCOUNTER — Other Ambulatory Visit: Payer: Self-pay | Admitting: *Deleted

## 2019-03-12 DIAGNOSIS — F32A Depression, unspecified: Secondary | ICD-10-CM

## 2019-03-12 DIAGNOSIS — F329 Major depressive disorder, single episode, unspecified: Secondary | ICD-10-CM

## 2019-03-12 NOTE — Telephone Encounter (Signed)
Patient had a 30 day supply of lexapro in June and has not had another refill since then.

## 2019-03-12 NOTE — Telephone Encounter (Signed)
Stacks. NTBS 30 days given 10/22/18 Last OV for dx 01/02/18 Pt on hold for balance

## 2019-03-14 ENCOUNTER — Other Ambulatory Visit: Payer: Self-pay | Admitting: Family Medicine

## 2019-03-14 DIAGNOSIS — F419 Anxiety disorder, unspecified: Secondary | ICD-10-CM

## 2019-03-14 DIAGNOSIS — F32A Depression, unspecified: Secondary | ICD-10-CM

## 2019-03-14 DIAGNOSIS — F329 Major depressive disorder, single episode, unspecified: Secondary | ICD-10-CM

## 2019-03-25 ENCOUNTER — Other Ambulatory Visit: Payer: Self-pay | Admitting: Family Medicine

## 2019-03-25 DIAGNOSIS — F329 Major depressive disorder, single episode, unspecified: Secondary | ICD-10-CM

## 2019-03-25 DIAGNOSIS — F32A Depression, unspecified: Secondary | ICD-10-CM

## 2019-03-25 DIAGNOSIS — F419 Anxiety disorder, unspecified: Secondary | ICD-10-CM

## 2019-03-25 MED ORDER — ESCITALOPRAM OXALATE 20 MG PO TABS: 20.0000 mg | ORAL_TABLET | Freq: Every day | ORAL | 0 refills | Status: DC

## 2019-03-28 ENCOUNTER — Other Ambulatory Visit: Payer: Self-pay

## 2019-03-28 DIAGNOSIS — Z20822 Contact with and (suspected) exposure to covid-19: Secondary | ICD-10-CM

## 2019-03-29 LAB — NOVEL CORONAVIRUS, NAA: SARS-CoV-2, NAA: NOT DETECTED

## 2019-04-03 ENCOUNTER — Ambulatory Visit (INDEPENDENT_AMBULATORY_CARE_PROVIDER_SITE_OTHER): Payer: Commercial Managed Care - PPO | Admitting: Family Medicine

## 2019-04-03 ENCOUNTER — Other Ambulatory Visit: Payer: Self-pay

## 2019-04-03 ENCOUNTER — Encounter: Payer: Self-pay | Admitting: Family Medicine

## 2019-04-03 VITALS — BP 138/97 | HR 92 | Temp 97.4°F | Ht 63.0 in | Wt 337.4 lb

## 2019-04-03 DIAGNOSIS — D519 Vitamin B12 deficiency anemia, unspecified: Secondary | ICD-10-CM

## 2019-04-03 DIAGNOSIS — F419 Anxiety disorder, unspecified: Secondary | ICD-10-CM | POA: Diagnosis not present

## 2019-04-03 DIAGNOSIS — F329 Major depressive disorder, single episode, unspecified: Secondary | ICD-10-CM

## 2019-04-03 DIAGNOSIS — E039 Hypothyroidism, unspecified: Secondary | ICD-10-CM

## 2019-04-03 DIAGNOSIS — F32A Depression, unspecified: Secondary | ICD-10-CM

## 2019-04-03 MED ORDER — CYANOCOBALAMIN 1000 MCG/ML IJ SOLN: INTRAMUSCULAR | 0 refills | Status: DC

## 2019-04-03 MED ORDER — PANTOPRAZOLE SODIUM 40 MG PO TBEC: 40.0000 mg | DELAYED_RELEASE_TABLET | Freq: Every day | ORAL | 11 refills | Status: DC

## 2019-04-03 MED ORDER — DICLOFENAC SODIUM 75 MG PO TBEC: 75.0000 mg | DELAYED_RELEASE_TABLET | Freq: Two times a day (BID) | ORAL | 1 refills | Status: DC

## 2019-04-03 MED ORDER — ESCITALOPRAM OXALATE 20 MG PO TABS: 20.0000 mg | ORAL_TABLET | Freq: Every day | ORAL | 0 refills | Status: DC

## 2019-04-03 NOTE — Progress Notes (Signed)
Subjective:  Patient ID: Diamond Marshall, female    DOB: 1970-12-12  Age: 48 y.o. MRN: [id number]  CC: Depression and Medical Management of Chronic Issues   HPI Diamond Marshall presents for Patient in for follow-up of GERD. Currently symptomatic with a lot of reflux and vomiting. There is chest pain with heartburn. No hematemesis and no melena. Pt. No dysphagia or choking. Onset is remote. Progression is stable. Complicating factors, none.  Migraines have resolved since she went off of 3rd shift.   Disgusted with weight. Wants weight loss program.   follow-up on  thyroid. The patient has a history of hypothyroidism for many years. It has been stable recently. Pt. denies any change in  voice, loss of hair, heat or cold intolerance. Energy level has been adequate to good. Patient denies constipation and diarrhea. No myxedema. Medication is as noted below. Verified that pt is taking it daily on an empty stomach. Well tolerated.    Depression screen [facility] 2/9 04/03/2019 09/24/2018 07/19/2018  Decreased Interest 0 0 1  Down, Depressed, Hopeless 0 0 2  PHQ - 2 Score 0 0 3  Altered sleeping 0 - 1  Tired, decreased energy 2 - 2  Change in appetite 2 - 2  Feeling bad or failure about yourself  0 - 2  Trouble concentrating 1 - 1  Moving slowly or fidgety/restless 0 - 2  Suicidal thoughts 0 - 0  PHQ-9 Score 5 - 13  Difficult doing work/chores Not difficult at all - -  Some recent data might be hidden    History Diamond Marshall has a past medical history of Anemia, Anxiety, Depression, GERD (gastroesophageal reflux disease), and Thyroid disease.   She has a past surgical history that includes Cesarean section (1999) and Dilatation and currettagement.   Her family history includes CVA in her father; Hypertension in her mother.She reports that she has never smoked. She has never used smokeless tobacco. She reports that she does not drink alcohol or use drugs.    ROS Review of Systems  Constitutional:  Negative.   HENT: Negative for congestion.   Eyes: Negative for visual disturbance.  Respiratory: Negative for shortness of breath.   Cardiovascular: Negative for chest pain.  Gastrointestinal: Negative for abdominal pain, constipation, diarrhea, nausea and vomiting.  Genitourinary: Negative for difficulty urinating.  Musculoskeletal: Negative for arthralgias and myalgias.  Neurological: Negative for headaches.  Psychiatric/Behavioral: Negative for sleep disturbance.    Objective:  BP (!) 138/97   Pulse 92   Temp (!) 97.4 F (36.3 C) (Temporal)   Ht 5\' 3"  (1.6 m)   Wt (!) 337 lb 6.4 oz (153 kg)   SpO2 97%   BMI 59.77 kg/m   BP Readings from Last 3 Encounters:  04/03/19 (!) 138/97  09/24/18 (!) 149/100  08/14/18 (!) 114/59    Wt Readings from Last 3 Encounters:  04/03/19 (!) 337 lb 6.4 oz (153 kg)  09/24/18 (!) 324 lb (147 kg)  08/14/18 (!) 318 lb 6 oz (144.4 kg)     Physical Exam Constitutional:      General: She is not in acute distress.    Appearance: She is well-developed.  HENT:     Head: Normocephalic and atraumatic.     Right Ear: External ear normal.     Left Ear: External ear normal.     Nose: Nose normal.  Eyes:     Conjunctiva/sclera: Conjunctivae normal.     Pupils: Pupils are equal, round, and reactive to  light.  Neck:     Musculoskeletal: Normal range of motion and neck supple.     Thyroid: No thyromegaly.  Cardiovascular:     Rate and Rhythm: Normal rate and regular rhythm.     Heart sounds: Normal heart sounds. No murmur.  Pulmonary:     Effort: Pulmonary effort is normal. No respiratory distress.     Breath sounds: Normal breath sounds. No wheezing or rales.  Abdominal:     General: Bowel sounds are normal. There is no distension.     Palpations: Abdomen is soft.     Tenderness: There is no abdominal tenderness.  Lymphadenopathy:     Cervical: No cervical adenopathy.  Skin:    General: Skin is warm and dry.  Neurological:     Mental  Status: She is alert and oriented to person, place, and time.     Deep Tendon Reflexes: Reflexes are normal and symmetric.  Psychiatric:        Behavior: Behavior normal.        Thought Content: Thought content normal.        Judgment: Judgment normal.       Assessment & Plan:   Diamond Marshall was seen today for depression and medical management of chronic issues.  Diagnoses and all orders for this visit:  Hypothyroidism, unspecified type -     CBC -     CMP -     TSH + free T4  Anxiety and depression -     escitalopram (LEXAPRO) 20 MG tablet; Take 1 tablet (20 mg total) by mouth daily. (Needs to be seen before next refill) -     CBC -     CMP -     TSH + free T4  Anemia due to vitamin B12 deficiency, unspecified B12 deficiency type -     CBC -     CMP -     TSH + free T4  Morbid obesity (HCC) -     Weight loss clinic  Other orders -     cyanocobalamin (,VITAMIN B-12,) 1000 MCG/ML injection; INJECT 1 ML INTRAMUSCULARLY EVERY 15 DAYS -     diclofenac (VOLTAREN) 75 MG EC tablet; Take 1 tablet (75 mg total) by mouth 2 (two) times daily. -     pantoprazole (PROTONIX) 40 MG tablet; Take 1 tablet (40 mg total) by mouth daily. For stomach       I have changed Diamond Marshall's diclofenac. I am also having her start on pantoprazole. Additionally, I am having her maintain her LORazepam, levothyroxine, ALPRAZolam, rizatriptan, escitalopram, and cyanocobalamin. We will stop administering triamcinolone acetonide and betamethasone acetate-betamethasone sodium phosphate.  Allergies as of 04/03/2019      Reactions   Phentermine    Became suicidal on the medication   Topiramate Other (See Comments)   dizziness   Septra [sulfamethoxazole-trimethoprim] Rash      Medication List       Accurate as of April 03, 2019 11:59 PM. If you have any questions, ask your nurse or doctor.        ALPRAZolam 1 MG tablet Commonly known as: XANAX Take 1 p.o. x1, 1/2 to 1-hour before  procedure   cyanocobalamin 1000 MCG/ML injection Commonly known as: (VITAMIN B-12) INJECT 1 ML INTRAMUSCULARLY EVERY 15 DAYS   diclofenac 75 MG EC tablet Commonly known as: VOLTAREN Take 1 tablet (75 mg total) by mouth 2 (two) times daily.   escitalopram 20 MG tablet Commonly known as: LEXAPRO Take 1  tablet (20 mg total) by mouth daily. (Needs to be seen before next refill)   levothyroxine 50 MCG tablet Commonly known as: SYNTHROID Take 1 tablet (50 mcg total) by mouth daily.   LORazepam 0.5 MG tablet Commonly known as: ATIVAN Take 1 tablet (0.5 mg total) by mouth 2 (two) times daily as needed for anxiety.   pantoprazole 40 MG tablet Commonly known as: PROTONIX Take 1 tablet (40 mg total) by mouth daily. For stomach Started by: Claretta Fraise, MD   rizatriptan 10 MG disintegrating tablet Commonly known as: Maxalt-MLT Take 1 tablet (10 mg total) by mouth as needed for migraine. May repeat in 2 hours if needed        Follow-up: No follow-ups on file.  Claretta Fraise, M.D.

## 2019-04-04 LAB — CBC WITH DIFFERENTIAL/PLATELET
Basophils Absolute: 0.1 10*3/uL (ref 0.0–0.2)
Basos: 1 %
EOS (ABSOLUTE): 0.2 10*3/uL (ref 0.0–0.4)
Eos: 2 %
Hematocrit: 36.4 % (ref 34.0–46.6)
Hemoglobin: 11.7 g/dL (ref 11.1–15.9)
Immature Grans (Abs): 0.1 10*3/uL (ref 0.0–0.1)
Immature Granulocytes: 1 %
Lymphocytes Absolute: 2.9 10*3/uL (ref 0.7–3.1)
Lymphs: 27 %
MCH: 24.9 pg — ABNORMAL LOW (ref 26.6–33.0)
MCHC: 32.1 g/dL (ref 31.5–35.7)
MCV: 77 fL — ABNORMAL LOW (ref 79–97)
Monocytes Absolute: 0.8 10*3/uL (ref 0.1–0.9)
Monocytes: 8 %
Neutrophils Absolute: 6.7 10*3/uL (ref 1.4–7.0)
Neutrophils: 61 %
Platelets: 353 10*3/uL (ref 150–450)
RBC: 4.7 x10E6/uL (ref 3.77–5.28)
RDW: 14.6 % (ref 11.7–15.4)
WBC: 10.6 10*3/uL (ref 3.4–10.8)

## 2019-04-04 LAB — CMP14+EGFR
ALT: 19 IU/L (ref 0–32)
AST: 14 IU/L (ref 0–40)
Albumin/Globulin Ratio: 1.4 (ref 1.2–2.2)
Albumin: 4 g/dL (ref 3.8–4.8)
Alkaline Phosphatase: 122 IU/L — ABNORMAL HIGH (ref 39–117)
BUN/Creatinine Ratio: 20 (ref 9–23)
BUN: 14 mg/dL (ref 6–24)
Bilirubin Total: <0.2 mg/dL (ref 0.0–1.2)
CO2: 25 mmol/L (ref 20–29)
Calcium: 8.9 mg/dL (ref 8.7–10.2)
Chloride: 101 mmol/L (ref 96–106)
Creatinine, Ser: 0.7 mg/dL (ref 0.57–1.00)
GFR calc Af Amer: 118 mL/min/{1.73_m2} (ref >59)
GFR calc non Af Amer: 103 mL/min/{1.73_m2} (ref >59)
Globulin, Total: 2.8 g/dL (ref 1.5–4.5)
Glucose: 84 mg/dL (ref 65–99)
Potassium: 4.4 mmol/L (ref 3.5–5.2)
Sodium: 139 mmol/L (ref 134–144)
Total Protein: 6.8 g/dL (ref 6.0–8.5)

## 2019-04-04 LAB — TSH+FREE T4
Free T4: 0.88 ng/dL (ref 0.82–1.77)
TSH: 3.65 u[IU]/mL (ref 0.450–4.500)

## 2019-04-07 NOTE — Progress Notes (Signed)
Hello Gae,  Your lab result is normal and/or stable.Some minor variations that are not significant are commonly marked abnormal, but do not represent any medical problem for you.  Best regards, Bruchy Mikel, M.D.

## 2019-04-08 ENCOUNTER — Encounter: Payer: Self-pay | Admitting: Family Medicine

## 2019-04-24 ENCOUNTER — Other Ambulatory Visit: Payer: Self-pay | Admitting: Family Medicine

## 2019-05-08 ENCOUNTER — Encounter: Payer: Self-pay | Admitting: Family Medicine

## 2019-06-10 ENCOUNTER — Other Ambulatory Visit: Payer: Self-pay | Admitting: *Deleted

## 2019-06-10 DIAGNOSIS — F329 Major depressive disorder, single episode, unspecified: Secondary | ICD-10-CM

## 2019-06-10 DIAGNOSIS — F32A Depression, unspecified: Secondary | ICD-10-CM

## 2019-06-10 MED ORDER — ESCITALOPRAM OXALATE 20 MG PO TABS: 20.0000 mg | ORAL_TABLET | Freq: Every day | ORAL | 5 refills | Status: DC

## 2019-06-20 ENCOUNTER — Other Ambulatory Visit: Payer: Self-pay | Admitting: Family Medicine

## 2019-06-20 ENCOUNTER — Other Ambulatory Visit: Payer: Self-pay | Admitting: *Deleted

## 2019-06-20 ENCOUNTER — Encounter: Payer: Self-pay | Admitting: Family Medicine

## 2019-06-20 MED ORDER — PREMPRO 0.45-1.5 MG PO TABS: 1.0000 | ORAL_TABLET | Freq: Every day | ORAL | 5 refills | Status: DC

## 2019-06-23 ENCOUNTER — Encounter: Payer: Self-pay | Admitting: Family Medicine

## 2019-06-23 ENCOUNTER — Other Ambulatory Visit: Payer: Self-pay | Admitting: Family Medicine

## 2019-06-23 ENCOUNTER — Telehealth: Payer: Self-pay | Admitting: Family Medicine

## 2019-06-23 MED ORDER — NORETHINDRONE ACET-ETHINYL EST 1.5-30 MG-MCG PO TABS: 1.0000 | ORAL_TABLET | Freq: Every day | ORAL | 11 refills | Status: DC

## 2019-06-23 NOTE — Telephone Encounter (Signed)
Patient aware.

## 2019-06-23 NOTE — Telephone Encounter (Signed)
Pharmacist states that patient has a deductible of $150  And $25 copay on prempro.  Is there a BC that could be given that is close to the strength of prempro?

## 2019-06-23 NOTE — Telephone Encounter (Signed)
I sent in the requested prescription 

## 2019-06-26 ENCOUNTER — Encounter: Payer: Self-pay | Admitting: Family Medicine

## 2019-07-01 ENCOUNTER — Ambulatory Visit: Payer: Commercial Managed Care - PPO | Admitting: Family Medicine

## 2019-07-16 ENCOUNTER — Encounter: Payer: Self-pay | Admitting: Family Medicine

## 2019-07-21 ENCOUNTER — Other Ambulatory Visit: Payer: Self-pay

## 2019-07-21 ENCOUNTER — Encounter (INDEPENDENT_AMBULATORY_CARE_PROVIDER_SITE_OTHER): Payer: Self-pay | Admitting: Bariatrics

## 2019-07-21 ENCOUNTER — Ambulatory Visit (INDEPENDENT_AMBULATORY_CARE_PROVIDER_SITE_OTHER): Payer: Commercial Managed Care - PPO | Admitting: Bariatrics

## 2019-07-21 VITALS — BP 131/103 | HR 89 | Temp 98.9°F | Ht 62.0 in | Wt 336.0 lb

## 2019-07-21 DIAGNOSIS — E538 Deficiency of other specified B group vitamins: Secondary | ICD-10-CM

## 2019-07-21 DIAGNOSIS — R5383 Other fatigue: Secondary | ICD-10-CM

## 2019-07-21 DIAGNOSIS — E559 Vitamin D deficiency, unspecified: Secondary | ICD-10-CM

## 2019-07-21 DIAGNOSIS — R0602 Shortness of breath: Secondary | ICD-10-CM | POA: Diagnosis not present

## 2019-07-21 DIAGNOSIS — E039 Hypothyroidism, unspecified: Secondary | ICD-10-CM

## 2019-07-21 DIAGNOSIS — F3289 Other specified depressive episodes: Secondary | ICD-10-CM

## 2019-07-21 DIAGNOSIS — E8881 Metabolic syndrome: Secondary | ICD-10-CM

## 2019-07-21 DIAGNOSIS — Z0289 Encounter for other administrative examinations: Secondary | ICD-10-CM

## 2019-07-21 DIAGNOSIS — Z9189 Other specified personal risk factors, not elsewhere classified: Secondary | ICD-10-CM

## 2019-07-21 DIAGNOSIS — E781 Pure hyperglyceridemia: Secondary | ICD-10-CM

## 2019-07-21 DIAGNOSIS — Z6841 Body Mass Index (BMI) 40.0 and over, adult: Secondary | ICD-10-CM

## 2019-07-21 DIAGNOSIS — R7309 Other abnormal glucose: Secondary | ICD-10-CM

## 2019-07-21 NOTE — Progress Notes (Signed)
Dear Dr. Claretta Fraise,   Thank you for referring Diamond Marshall to our clinic. The following note includes my evaluation and treatment recommendations.  Chief Complaint:   OBESITY Diamond Marshall (MR# [id number]) is a 49 y.o. female who presents for evaluation and treatment of obesity and related comorbidities. Current BMI is Body mass index is 61.46 kg/m.Marland Kitchen Diamond Marshall has been struggling with her weight for many years and has been unsuccessful in either losing weight, maintaining weight loss, or reaching her healthy weight goal.  Diamond Marshall is currently in the action stage of change and ready to dedicate time achieving and maintaining a healthier weight. Diamond Marshall is interested in becoming our Diamond Marshall and working on intensive lifestyle modifications including (but not limited to) diet and exercise for weight loss.  Diamond Marshall craves carbs and sweets. She states that she skips breakfast a few days a week.  Diamond Marshall's habits were reviewed today and are as follows: Her family eats meals together, she thinks her family will eat healthier with her, she struggles with family and or coworkers weight loss sabotage, her desired weight loss is 76-96 lbs, she has been heavy most of her life, she started gaining weight in college, her heaviest weight ever was 340 pounds, she craves carbs and sweets, she skips breakfast a few days a week, she frequently makes poor food choices, she has problems with excessive hunger, she frequently eats larger portions than normal, she has binge eating behaviors and she struggles with emotional eating.  Depression Screen Diamond Marshall's Food and Mood (modified PHQ-9) score was 22.  Depression screen PHQ 2/9 07/21/2019  Decreased Interest 3  Down, Depressed, Hopeless 3  PHQ - 2 Score 6  Altered sleeping 3  Tired, decreased energy 3  Change in appetite 3  Feeling bad or failure about yourself  2  Trouble concentrating 3  Moving slowly or fidgety/restless 2  Suicidal thoughts 0  PHQ-9  Score 22  Difficult doing work/chores Very difficult  Some recent data might be hidden   Subjective:   Other fatigue. Diamond Marshall denies daytime somnolence and admits to waking up still tired. Patent has a history of symptoms of morning headache. Diamond Marshall generally gets 7-8 hours of sleep per night, and states that she has generally restful sleep. Snoring is present. Apneic episodes are not present. Epworth Sleepiness Score is 9.  SOB (shortness of breath) on exertion. Diamond Marshall notes increasing shortness of breath with certain activities and seems to be worsening over time with weight gain. She notes getting out of breath sooner with activity than she used to. This has gotten worse recently. Diamond Marshall denies shortness of breath at rest or orthopnea. She is taking B12.  Hypertriglyceridemia. Diamond Marshall is on no medications.  Hypothyroidism, unspecified type. Diamond Marshall is taking Synthroid. She reports heat and cold intolerance.   Lab Results  Component Value Date   TSH 3.650 04/03/2019   Vitamin D deficiency. Diamond Marshall is not on Vitamin D. Last Vitamin D 17.2 on 01/02/2018.  B12 deficiency. Diamond Marshall is taking B12 IM every 15 days. She has positive pernicious anemia.   Lab Results  Component Value Date   VITAMINB12 1,006 [id number]   Metabolic syndrome. Diamond Marshall reports polyphagia.  Elevated glucose. Diamond Marshall has a history of some elevated blood glucose readings without a diagnosis of diabetes. She admits to polyphagia.  Other depression, with emotional eating. Diamond Marshall is struggling with emotional eating and using food for comfort to the extent that it is negatively impacting her health. She has been working on  behavior modification techniques to help reduce her emotional eating and has been somewhat successful. She shows no sign of suicidal or homicidal ideations.  At risk for hypoglycemia. Diamond Marshall is at increased risk for hypoglycemia due to elevated glucose and obesity.  Assessment/Plan:   Other  fatigue. Diamond Marshall does feel that her weight is causing her energy to be lower than it should be. Fatigue may be related to obesity, depression or many other causes. Labs will be ordered, and in the meanwhile, Ashleylynn will focus on self care including making healthy food choices, increasing physical activity and focusing on stress reduction. EKG 12-Lead ordered.  SOB (shortness of breath) on exertion. Diamond Marshall does feel that she gets out of breath more easily that she used to when she exercises. Diamond Marshall's shortness of breath appears to be obesity related and exercise induced. She has agreed to work on weight loss and gradually increase exercise to treat her exercise induced shortness of breath. Will continue to monitor closely. Lipid Panel With LDL/HDL Ratio ordered.  Hypertriglyceridemia. Diamond Marshall will decrease carbohydrates and increase healthy fats and protein.  Hypothyroidism, unspecified type. Diamond Marshall with long-standing hypothyroidism, on levothyroxine therapy. She appears euthyroid. Orders and follow up as documented in Diamond Marshall record. Diamond Marshall will continue her medications as directed.  Counseling . Good thyroid control is important for overall health. Supratherapeutic thyroid levels are dangerous and will not improve weight loss results. . The correct way to take levothyroxine is fasting, with water, separated by at least 30 minutes from breakfast, and separated by more than 4 hours from calcium, iron, multivitamins, acid reflux medications (PPIs).   Vitamin D deficiency. Low Vitamin D level contributes to fatigue and are associated with obesity, breast, and colon cancer. VITAMIN D 25 Hydroxy (Vit-D Deficiency, Fractures) level ordered.  B12 deficiency. The diagnosis was reviewed with the Diamond Marshall. Counseling provided today, see below. We will continue to monitor. Orders and follow up as documented in Diamond Marshall record. Diamond Marshall will continue B12 injections. Vitamin B12 level ordered  today.  Counseling . The body needs vitamin B12: to make red blood cells; to make DNA; and to help the nerves work properly so they can carry messages from the brain to the body.  . The main causes of vitamin B12 deficiency include dietary deficiency, digestive diseases, pernicious anemia, and having a surgery in which part of the stomach or small intestine is removed.  . Certain medicines can make it harder for the body to absorb vitamin B12. These medicines include: heartburn medications; some antibiotics; some medications used to treat diabetes, gout, and high cholesterol.  . In some cases, there are no symptoms of this condition. If the condition leads to anemia or nerve damage, various symptoms can occur, such as weakness or fatigue, shortness of breath, and numbness or tingling in your hands and feet.   . Treatment:  o May include taking vitamin B12 supplements.  o Avoid alcohol.  o Eat lots of healthy foods that contain vitamin B12: - Beef, pork, chicken, [place], and organ meats, such as liver.  - Seafood: This includes clams, rainbow trout, salmon, tuna, and haddock.  - Eggs.  - Cereal and dairy products that are fortified: This means that vitamin B12 has been added to the food.    Metabolic syndrome. Kiahna will decrease carbohydrates and increase healthy fats and protein.  Elevated glucose. Fasting labs will be obtained and results with be discussed with Eboni in 2 weeks at her follow up visit. In the meanwhile Silver was started on  a lower simple carbohydrate diet and will work on weight loss efforts. Hemoglobin A1c, Insulin, random labs ordered.  Other depression, with emotional eating. Behavior modification techniques were discussed today to help Ambernicole deal with her emotional/non-hunger eating behaviors.  Orders and follow up as documented in Diamond Marshall record. Tikisha will be referred to Dr. Mallie Mussel, our bariatric psychologist, for evaluation.  At risk for hypoglycemia. Irem  was given approximately 15 minutes of counseling today regarding prevention of hypoglycemia. She was advised of symptoms of hypoglycemia. Sude was instructed to avoid skipping meals, eat regular protein rich meals and schedule low calorie snacks as needed.   Repetitive spaced learning was employed today to elicit superior memory formation and behavioral change.  Class 3 severe obesity with serious comorbidity and body mass index (BMI) of 60.0 to 69.9 in adult, unspecified obesity type ([place]).  Humayra is currently in the action stage of change and her goal is to continue with weight loss efforts. I recommend Atzi begin the structured treatment plan as follows:  She has agreed to the Category 4 Plan + 100 calories.  She will work on meal planning and mindful eating.  We independently reviewed labs from 04/03/2019 with the Diamond Marshall including CMP, CBC, glucose, and thyroid.  Exercise goals: All adults should avoid inactivity. Some physical activity is better than none, and adults who participate in any amount of physical activity gain some health benefits.   Behavioral modification strategies: increasing lean protein intake, decreasing simple carbohydrates, increasing vegetables, increasing water intake, decreasing eating out, no skipping meals, meal planning and cooking strategies, keeping healthy foods in the home and planning for success.  She was informed of the importance of frequent follow-up visits to maximize her success with intensive lifestyle modifications for her multiple health conditions. She was informed we would discuss her lab results at her next visit unless there is a critical issue that needs to be addressed sooner. Emmajo agreed to keep her next visit at the agreed upon time to discuss these results.  Objective:   Blood pressure (!) 131/103, pulse 89, temperature 98.9 F (37.2 C), height 5\' 2"  (1.575 m), weight (!) 336 lb (152.4 kg), last menstrual period 06/16/2019,  SpO2 96 %. Body mass index is 61.46 kg/m.  EKG: Sinus  Rhythm with a rate of 84 BPM. Low voltage in precordial leads may be related to body habitus. RSR(V1) - nondiagnostic. Otherwise normal.  Indirect Calorimeter completed today shows a VO2 of 391 and a REE of 2720.  Her calculated basal metabolic rate is 0000000 thus her basal metabolic rate is better than expected.  General: Cooperative, alert, well developed, in no acute distress. HEENT: Conjunctivae and lids unremarkable. Cardiovascular: Regular rhythm.  Lungs: Normal work of breathing. Neurologic: No focal deficits.   Lab Results  Component Value Date   CREATININE 0.70 04/03/2019   BUN 14 04/03/2019   NA 139 04/03/2019   K 4.4 04/03/2019   CL 101 04/03/2019   CO2 25 04/03/2019   Lab Results  Component Value Date   ALT 19 04/03/2019   AST 14 04/03/2019   ALKPHOS 122 (H) 04/03/2019   BILITOT <0.2 04/03/2019   Lab Results  Component Value Date   HGBA1C 5.6 01/02/2018   No results found for: INSULIN Lab Results  Component Value Date   TSH 3.650 04/03/2019   Lab Results  Component Value Date   CHOL 147 07/24/2013   HDL 59 07/24/2013   LDLCALC 37 07/24/2013   TRIG 255 (H) 07/24/2013  Lab Results  Component Value Date   WBC 10.6 04/03/2019   HGB 11.7 04/03/2019   HCT 36.4 04/03/2019   MCV 77 (L) 04/03/2019   PLT 353 04/03/2019   Lab Results  Component Value Date   IRON 31 06/17/2018   TIBC 305 06/17/2018   FERRITIN 53 06/17/2018   Attestation Statements:   Reviewed by clinician on day of visit: allergies, medications, problem list, medical history, surgical history, family history, social history, and previous encounter notes.  Migdalia Dk, am acting as Location manager for [employer], DO   I have reviewed the above documentation for accuracy and completeness, and I agree with the above. [name], DO
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
## 2019-07-22 ENCOUNTER — Encounter (INDEPENDENT_AMBULATORY_CARE_PROVIDER_SITE_OTHER): Payer: Self-pay | Admitting: Bariatrics

## 2019-07-22 DIAGNOSIS — R7303 Prediabetes: Secondary | ICD-10-CM | POA: Insufficient documentation

## 2019-07-22 LAB — LIPID PANEL WITH LDL/HDL RATIO
Cholesterol, Total: 149 mg/dL (ref 100–199)
HDL: 44 mg/dL (ref >39)
LDL Chol Calc (NIH): 80 mg/dL (ref 0–99)
LDL/HDL Ratio: 1.8 ratio (ref 0.0–3.2)
Triglycerides: 143 mg/dL (ref 0–149)
VLDL Cholesterol Cal: 25 mg/dL (ref 5–40)

## 2019-07-22 LAB — HEMOGLOBIN A1C
Est. average glucose Bld gHb Est-mCnc: 117 mg/dL
Hgb A1c MFr Bld: 5.7 % — ABNORMAL HIGH (ref 4.8–5.6)

## 2019-07-22 LAB — VITAMIN B12: Vitamin B-12: 470 pg/mL (ref 232–1245)

## 2019-07-22 LAB — VITAMIN D 25 HYDROXY (VIT D DEFICIENCY, FRACTURES): Vit D, 25-Hydroxy: 12.9 ng/mL — ABNORMAL LOW (ref 30.0–100.0)

## 2019-07-22 LAB — INSULIN, RANDOM: INSULIN: 19.8 u[IU]/mL (ref 2.6–24.9)

## 2019-07-23 ENCOUNTER — Ambulatory Visit (INDEPENDENT_AMBULATORY_CARE_PROVIDER_SITE_OTHER): Payer: Commercial Managed Care - PPO | Admitting: Psychology

## 2019-07-23 ENCOUNTER — Other Ambulatory Visit: Payer: Self-pay

## 2019-07-23 DIAGNOSIS — F3289 Other specified depressive episodes: Secondary | ICD-10-CM

## 2019-07-23 DIAGNOSIS — F5089 Other specified eating disorder: Secondary | ICD-10-CM | POA: Diagnosis not present

## 2019-07-23 NOTE — Progress Notes (Signed)
  Office: [phone]  /  Fax: [phone]    Date: August 06, 2019   Appointment Start Time: 2:04pm Duration: 25 minutes Provider: Glennie Isle, Psy.D. Type of Session: Individual Therapy  Location of Patient: Work Biomedical scientist of Provider: Provider's Home Type of Contact: Telepsychological Visit via [employer]  Session Content: This provider called Diamond Marshall at 2:02pm as she did not present for the WebEx appointment. The e-mail with the secure link was re-sent. As such, today's appointment was initiated 4 minutes late. Diamond Marshall is a 49 y.o. female presenting via [employer] for a follow-up appointment to address the previously established treatment goal of decreasing emotional eating. Today's appointment was a telepsychological visit due to COVID-19. Diamond Marshall provided verbal consent for today's telepsychological appointment and she is aware she is responsible for securing confidentiality on her end of the session. Prior to proceeding with today's appointment, Diamond Marshall's physical location at the time of this appointment was obtained as well a phone number she could be reached at in the event of technical difficulties. Diamond Marshall and this provider participated in today's telepsychological service.   This provider conducted a brief check-in. Diamond Marshall shared about recent events, including recent stressors as well as recent weight loss. Emotional and physical hunger was reviewed. Psychoeducation regarding all or nothing thinking was provided. Session then focused on recent stressors. Associated thoughts and feelings were processed. She also discussed having the urge today to eat breakfast at [employer] due to ongoing stressors, but noted she opted to make breakfast at home. This instance was further processed, and positive reinforcement was provided. Diamond Marshall was receptive to today's appointment as evidenced by openness to sharing and responsiveness to feedback.  Mental Status Examination:  Appearance: well  groomed and appropriate hygiene  Behavior: appropriate to circumstances Mood: euthymic Affect: mood congruent Speech: normal in rate, volume, and tone Eye Contact: appropriate Psychomotor Activity: appropriate Gait: unable to assess Thought Process: linear, logical, and goal directed  Thought Content/Perception: endorses homicidal ideation, but denies plan and intent  and no hallucinations, delusions, bizarre thinking or behavior reported or observed Orientation: time, person, place and purpose of appointment Memory/Concentration: memory, attention, language, and fund of knowledge intact  Insight/Judgment: good  Interventions:  Conducted a brief chart review Provided empathic reflections and validation Reviewed content from the previous session Employed supportive psychotherapy interventions to facilitate reduced distress and to improve coping skills with identified stressors Employed motivational interviewing skills to assess patient's willingness/desire to adhere to recommended medical treatments and assignments Psychoeducation provided regarding all or nothing thinking  Provided positive reinforcement  DSM-5 Diagnosis(es): 311 (F32.8) Other Specified Depressive Disorder, Emotional Eating Behaviors  Treatment Goal & Progress: During the initial appointment with this provider, the following treatment goal was established: decrease emotional eating. Progress is limited, as Diamond Marshall has just begun treatment with this provider; however, she is receptive to the interaction and interventions and rapport is being established.   Plan: The next appointment will be scheduled in two weeks, which will be via [employer]. The next session will focus on working toward the established treatment goal.

## 2019-08-04 ENCOUNTER — Ambulatory Visit (INDEPENDENT_AMBULATORY_CARE_PROVIDER_SITE_OTHER): Payer: Commercial Managed Care - PPO | Admitting: Bariatrics

## 2019-08-04 ENCOUNTER — Encounter (INDEPENDENT_AMBULATORY_CARE_PROVIDER_SITE_OTHER): Payer: Self-pay | Admitting: Bariatrics

## 2019-08-04 ENCOUNTER — Other Ambulatory Visit: Payer: Self-pay

## 2019-08-04 VITALS — BP 153/93 | HR 90 | Temp 98.5°F | Ht 62.0 in | Wt 332.0 lb

## 2019-08-04 DIAGNOSIS — E66813 Obesity, class 3: Secondary | ICD-10-CM

## 2019-08-04 DIAGNOSIS — R7303 Prediabetes: Secondary | ICD-10-CM | POA: Diagnosis not present

## 2019-08-04 DIAGNOSIS — E559 Vitamin D deficiency, unspecified: Secondary | ICD-10-CM

## 2019-08-04 DIAGNOSIS — Z6841 Body Mass Index (BMI) 40.0 and over, adult: Secondary | ICD-10-CM

## 2019-08-04 DIAGNOSIS — Z9189 Other specified personal risk factors, not elsewhere classified: Secondary | ICD-10-CM | POA: Diagnosis not present

## 2019-08-04 MED ORDER — VITAMIN D (ERGOCALCIFEROL) 1.25 MG (50000 UNIT) PO CAPS: 50000.0000 [IU] | ORAL_CAPSULE | ORAL | 0 refills | Status: DC

## 2019-08-05 ENCOUNTER — Encounter (INDEPENDENT_AMBULATORY_CARE_PROVIDER_SITE_OTHER): Payer: Self-pay | Admitting: Bariatrics

## 2019-08-05 NOTE — Progress Notes (Signed)
Chief Complaint:   OBESITY Diamond Marshall is here to discuss her progress with her obesity treatment plan along with follow-up of her obesity related diagnoses. Diamond Marshall is on the Category 4 Plan + 100 calories and states she is following her eating plan approximately 75-80% of the time. [name] states she is exercising 0 minutes 0 times per week.  Today's visit was #: 2 Starting weight: 336 lbs Starting date: 07/21/2019 Today's weight: 332 lbs Today's date: 08/04/2019 Total lbs lost to date: 4 Total lbs lost since last in-office visit: 4  Interim History: Diamond Marshall is down 4 lbs. The first week was easier than the second. The second week she did have more carbohydrates. She reports doing better with her water intake.  Subjective:   Vitamin D deficiency. Adler is not on Vitamin D supplementation. Last Vitamin D 12.9 on 07/21/2019.  Prediabetes. Kevona has a diagnosis of prediabetes based on her elevated HgA1c and was informed this puts her at greater risk of developing diabetes. She continues to work on diet and exercise to decrease her risk of diabetes. She denies nausea or hypoglycemia.  Lab Results  Component Value Date   HGBA1C 5.7 (H) 07/21/2019   Lab Results  Component Value Date   INSULIN 19.8 07/21/2019   At risk for diabetes mellitus. Diamond Marshall is at higher than average risk for developing diabetes due to her obesity and prediabetes.   Assessment/Plan:   Vitamin D deficiency. Low Vitamin D level contributes to fatigue and are associated with obesity, breast, and colon cancer. She was given a prescription for Vitamin D, Ergocalciferol, (DRISDOL) 1.25 MG (50000 UNIT) CAPS capsule every week #4 with 0 refills and will follow-up for routine testing of Vitamin D, at least 2-3 times per year to avoid over-replacement.    Prediabetes. Diamond Marshall will continue to work on weight loss, increasing exercise, and decreasing simple carbohydrates to help decrease the risk of diabetes.     At risk for diabetes mellitus. Diamond Marshall was given approximately 15 minutes of diabetes education and counseling today. We discussed intensive lifestyle modifications today with an emphasis on weight loss as well as increasing exercise and decreasing simple carbohydrates in her diet. We also reviewed medication options with an emphasis on risk versus benefit of those discussed.   Repetitive spaced learning was employed today to elicit superior memory formation and behavioral change.  Class 3 severe obesity with serious comorbidity and body mass index (BMI) of 60.0 to 69.9 in adult, unspecified obesity type ([place]).  Diamond Marshall is currently in the action stage of change. As such, her goal is to continue with weight loss efforts. She has agreed to the Category 4 Plan with additional lunch and dinner options.   She will work on meal planning, intentional eating, and will weigh the meat.  We independently reviewed labs from 07/21/2019 with the patient including lipids, Vitamin D, B12, A1c, and insulin.  Exercise goals: Diamond Marshall will start gentle walking.  Behavioral modification strategies: increasing lean protein intake, decreasing simple carbohydrates, increasing vegetables, increasing water intake, decreasing eating out, no skipping meals, meal planning and cooking strategies, keeping healthy foods in the home and planning for success.  Diamond Marshall has agreed to follow-up with our clinic in 2 weeks. She was informed of the importance of frequent follow-up visits to maximize her success with intensive lifestyle modifications for her multiple health conditions.   Objective:   Blood pressure (!) 153/93, pulse 90, temperature 98.5 F (36.9 C), height 5\' 2"  (1.575 m),  weight (!) 332 lb (150.6 kg), last menstrual period 07/25/2019, SpO2 98 %. Body mass index is 60.72 kg/m.  General: Cooperative, alert, well developed, in no acute distress. HEENT: Conjunctivae and lids unremarkable. Cardiovascular:  Regular rhythm.  Lungs: Normal work of breathing. Neurologic: No focal deficits.   Lab Results  Component Value Date   CREATININE 0.70 04/03/2019   BUN 14 04/03/2019   NA 139 04/03/2019   K 4.4 04/03/2019   CL 101 04/03/2019   CO2 25 04/03/2019   Lab Results  Component Value Date   ALT 19 04/03/2019   AST 14 04/03/2019   ALKPHOS 122 (H) 04/03/2019   BILITOT <0.2 04/03/2019   Lab Results  Component Value Date   HGBA1C 5.7 (H) 07/21/2019   HGBA1C 5.6 01/02/2018   Lab Results  Component Value Date   INSULIN 19.8 07/21/2019   Lab Results  Component Value Date   TSH 3.650 04/03/2019   Lab Results  Component Value Date   CHOL 149 07/21/2019   HDL 44 07/21/2019   LDLCALC 80 07/21/2019   TRIG 143 07/21/2019   Lab Results  Component Value Date   WBC 10.6 04/03/2019   HGB 11.7 04/03/2019   HCT 36.4 04/03/2019   MCV 77 (L) 04/03/2019   PLT 353 04/03/2019   Lab Results  Component Value Date   IRON 31 06/17/2018   TIBC 305 06/17/2018   FERRITIN 53 06/17/2018   Attestation Statements:   Reviewed by clinician on day of visit: allergies, medications, problem list, medical history, surgical history, family history, social history, and previous encounter notes.  Migdalia Dk, am acting as Location manager for [employer], DO   I have reviewed the above documentation for accuracy and completeness, and I agree with the above. [name], DO

## 2019-08-06 ENCOUNTER — Other Ambulatory Visit: Payer: Self-pay

## 2019-08-06 ENCOUNTER — Ambulatory Visit (INDEPENDENT_AMBULATORY_CARE_PROVIDER_SITE_OTHER): Payer: Commercial Managed Care - PPO | Admitting: Psychology

## 2019-08-06 DIAGNOSIS — F3289 Other specified depressive episodes: Secondary | ICD-10-CM

## 2019-08-06 NOTE — Progress Notes (Signed)
Office: [phone]  /  Fax: [phone]    Date: August 20, 2019   Appointment Start Time: 3:58pm Duration: 32 minutes Provider: Glennie Isle, Psy.D. Type of Session: Individual Therapy  Location of Patient: Work Biomedical scientist of Provider: Provider's Home Type of Contact: Telepsychological Visit via [employer]  Session Content: Diamond Marshall is a 49 y.o. female presenting via [place] for a follow-up appointment to address the previously established treatment goal of decreasing emotional eating. Today's appointment was a telepsychological visit due to COVID-19. Diamond Marshall provided verbal consent for today's telepsychological appointment and she is aware she is responsible for securing confidentiality on her end of the session. Prior to proceeding with today's appointment, Diamond Marshall's physical location at the time of this appointment was obtained as well a phone number she could be reached at in the event of technical difficulties. Diamond Marshall and this provider participated in today's telepsychological service.   This provider conducted a brief check-in. Diamond Marshall shared she "did not have a very good week last week" as it relates to eating, noting she has "gotten back on track." This was further explored. She explained not feeling well physically resulted in her changing her eating habits. In addition, Diamond Marshall described experiencing anticipatory anxiety about Easter dinner. Thus, psychoeducation regarding making better choices and engaging in portion control during the holiday was provided. More specifically, this provider discussed the following strategies: coming to the meal hungry, but not starving; managing portion sizes; not completely depriving yourself; pacing yourself (e.g., waiting 10 minutes before going back for seconds); staying hydrated; and avoid bringing home leftovers. Moreover, psychoeducation regarding triggers for emotional eating was provided. Diamond Marshall was provided a handout, and encouraged to  utilize the handout between now and the next appointment to increase awareness of triggers and frequency. Diamond Marshall agreed. This provider also discussed behavioral strategies for specific triggers, such as placing the utensil down when conversing to avoid mindless eating. Diamond Marshall provided verbal consent during today's appointment for this provider to send a handout about triggers via e-mail. Diamond Marshall was receptive to today's appointment as evidenced by openness to sharing, responsiveness to feedback, willingness to implement discussed strategies on Easter, and willingness to explore triggers for emotional eating.  Mental Status Examination:  Appearance: well groomed and appropriate hygiene  Behavior: appropriate to circumstances Mood: euthymic Affect: mood congruent Speech: normal in rate, volume, and tone Eye Contact: appropriate Psychomotor Activity: appropriate Gait: unable to assess Thought Process: linear, logical, and goal directed  Thought Content/Perception: no hallucinations, delusions, bizarre thinking or behavior reported or observed and no evidence of suicidal and homicidal ideation, plan, and intent Orientation: time, person, place and purpose of appointment Memory/Concentration: memory, attention, language, and fund of knowledge intact  Insight/Judgment: good   Interventions:  Conducted a brief chart review Provided empathic reflections and validation Employed supportive psychotherapy interventions to facilitate reduced distress and to improve coping skills with identified stressors Employed motivational interviewing skills to assess patient's willingness/desire to adhere to recommended medical treatments and assignments Psychoeducation provided regarding triggers for emotional eating Discussed strategies for Easter  DSM-5 Diagnosis(es): 311 (F32.8) Other Specified Depressive Disorder, Emotional Eating Behaviors  Treatment Goal & Progress: During the initial appointment with  this provider, the following treatment goal was established: decrease emotional eating. Diamond Marshall has demonstrated progress in her goal as evidenced by increased awareness of hunger patterns. Diamond Marshall also continues to demonstrate willingness to engage in learned skill(s).  Plan: The next appointment will be scheduled in approximately two weeks, which will be via MyChart Video Visit. The  next session will focus on working towards the established treatment goal.

## 2019-08-20 ENCOUNTER — Ambulatory Visit (INDEPENDENT_AMBULATORY_CARE_PROVIDER_SITE_OTHER): Payer: Commercial Managed Care - PPO | Admitting: Psychology

## 2019-08-20 ENCOUNTER — Other Ambulatory Visit: Payer: Self-pay

## 2019-08-20 DIAGNOSIS — F3289 Other specified depressive episodes: Secondary | ICD-10-CM

## 2019-08-21 ENCOUNTER — Encounter: Payer: Self-pay | Admitting: Nurse Practitioner

## 2019-08-25 ENCOUNTER — Other Ambulatory Visit: Payer: Self-pay | Admitting: Family Medicine

## 2019-08-25 DIAGNOSIS — N921 Excessive and frequent menstruation with irregular cycle: Secondary | ICD-10-CM

## 2019-08-25 NOTE — Progress Notes (Signed)
Office: [phone]  /  Fax: [phone]    Date: September 08, 2019   Appointment Start Time: 1:58pm Duration: 29 minutes Provider: Glennie Isle, Psy.D. Type of Session: Individual Therapy  Location of Patient: Work Location of Provider: Provider's Home Type of Contact: Telepsychological Visit via MyChart Video Visit  Session Content: Diamond Marshall is a 49 y.o. female presenting via [place] Visit for a follow-up appointment to address the previously established treatment goal of decreasing emotional eating. Today's appointment was a telepsychological visit due to COVID-19. Diamond Marshall provided verbal consent for today's telepsychological appointment and she is aware she is responsible for securing confidentiality on her end of the session. Prior to proceeding with today's appointment, Diamond Marshall physical location at the time of this appointment was obtained as well a phone number she could be reached at in the event of technical difficulties. Diamond Marshall and this provider participated in today's telepsychological service.   Diamond Marshall acknowledged understanding that for today's appointment and future telepsychological appointments, MyChart will be utilized. She also verbally acknowledged understanding that the information outlined in the telepsychological informed consent form signed at the onset of treatment would still be applicable despite the change in the videoconferencing platform.   This provider conducted a brief check-in. Diamond Marshall reported she is not feeling well due to pain and being on prednisone. She also shared about a recent loss in the family. Regarding eating, Diamond Marshall reported she is not eating enough since starting prednisone. Prior to starting prednisone, Diamond Marshall noted she was "doing pretty good" with her meal plan and described a reduction in emotional eating. Psychoeducation regarding mindfulness was provided to assist with coping. A handout was provided to Diamond Marshall with further information  regarding mindfulness, including exercises. This provider also explained the benefit of mindfulness as it relates to emotional eating. Diamond Marshall was encouraged to engage in the provided exercises between now and the next appointment with this provider. Diamond Marshall agreed. During today's appointment, Diamond Marshall was led through a mindfulness exercise involving her senses. Diamond Marshall provided verbal consent during today's appointment for this provider to send a handout about mindfulness via e-mail. Furthermore, termination planning was discussed. Diamond Marshall was receptive to a follow-up appointment in 3-4 weeks and an additional follow-up/termination appointment in 3-4 weeks after that. Diamond Marshall was receptive to today's appointment as evidenced by openness to sharing, responsiveness to feedback, and willingness to engage in mindfulness exercises to assist with coping.  Mental Status Examination:  Appearance: well groomed and appropriate hygiene  Behavior: appropriate to circumstances Mood: "tired, irritable" Affect: mood congruent Speech: normal in rate, volume, and tone Eye Contact: appropriate Psychomotor Activity: appropriate Gait: unable to assess Thought Process: linear, logical, and goal directed  Thought Content/Perception: no hallucinations, delusions, bizarre thinking or behavior reported or observed and no evidence of suicidal and homicidal ideation, plan, and intent Orientation: time, person, place and purpose of appointment Memory/Concentration: memory, attention, language, and fund of knowledge intact  Insight/Judgment: good  Interventions:  Conducted a brief chart review Provided empathic reflections and validation Employed supportive psychotherapy interventions to facilitate reduced distress and to improve coping skills with identified stressors Employed motivational interviewing skills to assess patient's willingness/desire to adhere to recommended medical treatments and assignments Psychoeducation  provided regarding mindfulness Engaged patient in mindfulness exercise(s) Employed acceptance and commitment interventions to emphasize mindfulness and acceptance without struggle  Discussed termination planning  DSM-5 Diagnosis(es): 311 (F32.8) Other Specified Depressive Disorder, Emotional Eating Behaviors  Treatment Goal & Progress: During the initial appointment with this provider, the following treatment goal was established:  decrease emotional eating. Diamond Marshall has demonstrated progress in her goal as evidenced by increased awareness of hunger patterns, increased awareness of triggers for emotional eating and reduction in emotional eating. Diamond Marshall also demonstrates willingness to engage in mindfulness exercises.  Plan: The next appointment will be scheduled in three weeks, which will be via MyChart Video Visit. The next session will focus on working towards the established treatment goal.

## 2019-08-26 ENCOUNTER — Ambulatory Visit (INDEPENDENT_AMBULATORY_CARE_PROVIDER_SITE_OTHER): Payer: Commercial Managed Care - PPO | Admitting: Bariatrics

## 2019-08-26 ENCOUNTER — Encounter (INDEPENDENT_AMBULATORY_CARE_PROVIDER_SITE_OTHER): Payer: Self-pay | Admitting: Bariatrics

## 2019-08-26 ENCOUNTER — Other Ambulatory Visit: Payer: Self-pay

## 2019-08-26 VITALS — BP 162/89 | HR 89 | Temp 98.6°F | Ht 62.0 in | Wt 327.0 lb

## 2019-08-26 DIAGNOSIS — Z9189 Other specified personal risk factors, not elsewhere classified: Secondary | ICD-10-CM

## 2019-08-26 DIAGNOSIS — E7849 Other hyperlipidemia: Secondary | ICD-10-CM

## 2019-08-26 DIAGNOSIS — I10 Essential (primary) hypertension: Secondary | ICD-10-CM

## 2019-08-26 DIAGNOSIS — Z6841 Body Mass Index (BMI) 40.0 and over, adult: Secondary | ICD-10-CM

## 2019-08-26 DIAGNOSIS — E538 Deficiency of other specified B group vitamins: Secondary | ICD-10-CM | POA: Diagnosis not present

## 2019-08-26 DIAGNOSIS — R7303 Prediabetes: Secondary | ICD-10-CM

## 2019-08-26 MED ORDER — CHLORTHALIDONE 25 MG PO TABS: 25.0000 mg | ORAL_TABLET | Freq: Every day | ORAL | 0 refills | Status: DC

## 2019-08-26 NOTE — Progress Notes (Signed)
Chief Complaint:   OBESITY Diamond Marshall is here to discuss her progress with her obesity treatment plan along with follow-up of her obesity related diagnoses. Diamond Marshall is on the Category 4 Plan and states she is following her eating plan approximately 75% of the time. Diamond Marshall states she is exercising 0 minutes 0 times per week.  Today's visit was #: 3 Starting weight: 336 lbs Starting date: 07/21/2019 Today's weight: 327 lbs Today's date: 08/26/2019 Total lbs lost to date: 9 Total lbs lost since last in-office visit: 5  Interim History: Diamond Marshall is down 5 lbs since her last visit and doing well overall. She reports doing well with her water and protein intake.  Subjective:   Other hyperlipidemia. Diamond Marshall is on no medications.   Lab Results  Component Value Date   CHOL 149 07/21/2019   HDL 44 07/21/2019   LDLCALC 80 07/21/2019   TRIG 143 07/21/2019   Lab Results  Component Value Date   ALT 19 04/03/2019   AST 14 04/03/2019   ALKPHOS 122 (H) 04/03/2019   BILITOT <0.2 04/03/2019   The 10-year ASCVD risk score Diamond Marshall DC Jr., et al., 2013) is: 2%   Values used to calculate the score:     Age: 49 years     Sex: Female     Is Non-Hispanic African American: No     Diabetic: No     Tobacco smoker: No     Systolic Blood Pressure: 0000000 mmHg     Is BP treated: Yes     HDL Cholesterol: 44 mg/dL     Total Cholesterol: 149 mg/dL  Essential hypertension. Diamond Marshall had multiple elevated blood readings. Blood pressure elevated today at 162/94; previous 153/93. She endorses headaches.  BP Readings from Last 3 Encounters:  08/26/19 (!) 162/89  08/04/19 (!) 153/93  07/21/19 (!) 131/103   Lab Results  Component Value Date   CREATININE 0.70 04/03/2019   CREATININE 0.74 06/17/2018   CREATININE 0.77 01/02/2018   B12 nutritional deficiency. Diamond Marshall is taking Vitamin B12 injections every 2 weeks.  Lab Results  Component Value Date   [id number] 07/21/2019   Prediabetes.  Diamond Marshall has a diagnosis of prediabetes based on her elevated HgA1c and was informed this puts her at greater risk of developing diabetes. She continues to work on diet and exercise to decrease her risk of diabetes. She denies nausea or hypoglycemia. No polyphagia.  Lab Results  Component Value Date   HGBA1C 5.7 (H) 07/21/2019   Lab Results  Component Value Date   INSULIN 19.8 07/21/2019   At risk for heart disease. Diamond Marshall is at a higher than average risk for cardiovascular disease due to hypertension and obesity.   Assessment/Plan:   Other hyperlipidemia. Cardiovascular risk and specific lipid/LDL goals reviewed.  We discussed several lifestyle modifications today and Diamond Marshall will continue to work on diet, exercise and weight loss efforts. Orders and follow up as documented in patient record. She will decrease saturated fats, avoid trans fats, and increase PUFA's and MUFA's.  Counseling Intensive lifestyle modifications are the first line treatment for this issue. . Dietary changes: Increase soluble fiber. Decrease simple carbohydrates. . Exercise changes: Moderate to vigorous-intensity aerobic activity 150 minutes per week if tolerated. . Lipid-lowering medications: see documented in medical record.  Essential hypertension. Diamond Marshall is working on healthy weight loss and exercise to improve blood pressure control. We will watch for signs of hypotension as she continues her lifestyle modifications. She was given a prescription  for chlorthalidone (HYGROTON) 25 MG tablet 1 daily #30 with 0 refills.  B12 nutritional deficiency. The diagnosis was reviewed with the patient. Counseling provided today, see below. We will continue to monitor. Orders and follow up as documented in patient record. Diamond Marshall will continue Vitamin B12 as directed.  Counseling . The body needs vitamin B12: to make red blood cells; to make DNA; and to help the nerves work properly so they can carry messages from the brain  to the body.  . The main causes of vitamin B12 deficiency include dietary deficiency, digestive diseases, pernicious anemia, and having a surgery in which part of the stomach or small intestine is removed.  . Certain medicines can make it harder for the body to absorb vitamin B12. These medicines include: heartburn medications; some antibiotics; some medications used to treat diabetes, gout, and high cholesterol.  . In some cases, there are no symptoms of this condition. If the condition leads to anemia or nerve damage, various symptoms can occur, such as weakness or fatigue, shortness of breath, and numbness or tingling in your hands and feet.   . Treatment:  o May include taking vitamin B12 supplements.  o Avoid alcohol.  o Eat lots of healthy foods that contain vitamin B12: - Beef, pork, chicken, [place], and organ meats, such as liver.  - Seafood: This includes clams, rainbow trout, salmon, tuna, and haddock.  - Eggs.  - Cereal and dairy products that are fortified: This means that vitamin B12 has been added to the food.   Prediabetes. Diamond Marshall will continue to work on weight loss, exercise, and decreasing simple carbohydrates to help decrease the risk of diabetes. Handout was given on Insulin Resistance/Prediabetes.  At risk for heart disease. Diamond Marshall was given approximately 15 minutes of coronary artery disease prevention counseling today. She is 49 y.o. female and has risk factors for heart disease including obesity. We discussed intensive lifestyle modifications today with an emphasis on specific weight loss instructions and strategies.   Repetitive spaced learning was employed today to elicit superior memory formation and behavioral change.  Class 3 severe obesity with serious comorbidity and body mass index (BMI) of 50.0 to 59.9 in adult, unspecified obesity type ([place]).  Diamond Marshall is currently in the action stage of change. As such, her goal is to continue with weight loss efforts. She has  agreed to the Category 4 Plan.   She will work on meal planning and mindful eating.  We independently reviewed with the patient labs from 07/21/2019 including Vitamin D, B12, and lipids.  Exercise goals: Diamond Marshall will do some walking. She has plantar fasciitis.  Behavioral modification strategies: increasing lean protein intake, decreasing simple carbohydrates, increasing vegetables, increasing water intake, decreasing liquid calories, decreasing alcohol intake, decreasing sodium intake, increasing high fiber foods, decreasing eating out, no skipping meals, meal planning and cooking strategies, keeping healthy foods in the home, ways to avoid boredom eating, ways to avoid night time snacking, better snacking choices, emotional eating strategies and planning for success.  Diamond Marshall has agreed to follow-up with our clinic in 2 weeks. She was informed of the importance of frequent follow-up visits to maximize her success with intensive lifestyle modifications for her multiple health conditions.   Objective:   Blood pressure (!) 162/89, pulse 89, temperature 98.6 F (37 C), height 5\' 2"  (1.575 m), weight (!) 327 lb (148.3 kg), last menstrual period 08/22/2019, SpO2 95 %. Body mass index is 59.81 kg/m.  General: Cooperative, alert, well developed, in no acute distress.  HEENT: Conjunctivae and lids unremarkable. Cardiovascular: Regular rhythm.  Lungs: Normal work of breathing. Neurologic: No focal deficits.   Lab Results  Component Value Date   CREATININE 0.70 04/03/2019   BUN 14 04/03/2019   NA 139 04/03/2019   K 4.4 04/03/2019   CL 101 04/03/2019   CO2 25 04/03/2019   Lab Results  Component Value Date   ALT 19 04/03/2019   AST 14 04/03/2019   ALKPHOS 122 (H) 04/03/2019   BILITOT <0.2 04/03/2019   Lab Results  Component Value Date   HGBA1C 5.7 (H) 07/21/2019   HGBA1C 5.6 01/02/2018   Lab Results  Component Value Date   INSULIN 19.8 07/21/2019   Lab Results  Component Value  Date   TSH 3.650 04/03/2019   Lab Results  Component Value Date   CHOL 149 07/21/2019   HDL 44 07/21/2019   LDLCALC 80 07/21/2019   TRIG 143 07/21/2019   Lab Results  Component Value Date   WBC 10.6 04/03/2019   HGB 11.7 04/03/2019   HCT 36.4 04/03/2019   MCV 77 (L) 04/03/2019   PLT 353 04/03/2019   Lab Results  Component Value Date   IRON 31 06/17/2018   TIBC 305 06/17/2018   FERRITIN 53 06/17/2018   Attestation Statements:   Reviewed by clinician on day of visit: allergies, medications, problem list, medical history, surgical history, family history, social history, and previous encounter notes.  Migdalia Dk, am acting as Location manager for [employer], DO   I have reviewed the above documentation for accuracy and completeness, and I agree with the above. [name], DO

## 2019-08-29 ENCOUNTER — Telehealth: Payer: Self-pay | Admitting: Family Medicine

## 2019-08-29 ENCOUNTER — Ambulatory Visit (HOSPITAL_COMMUNITY): Admission: RE | Admit: 2019-08-29 | Payer: Commercial Managed Care - PPO | Source: Ambulatory Visit

## 2019-08-29 NOTE — Telephone Encounter (Signed)
Pt called stating that she is supposed to have an appt at [facility] today for an [place] but says she saw a message on her My Chart stating that she would owe $656.12 at check in. Pt says she can't afford to pay that and has tried calling to cancel but can't get in touch with anyone.

## 2019-08-29 NOTE — Telephone Encounter (Signed)
Patient's appt has been cancelled

## 2019-09-01 ENCOUNTER — Telehealth: Payer: Self-pay | Admitting: Family Medicine

## 2019-09-01 NOTE — Telephone Encounter (Signed)
Appointment 09/02/2019 with Hendricks Limes, patient aware.

## 2019-09-02 ENCOUNTER — Other Ambulatory Visit: Payer: Self-pay

## 2019-09-02 ENCOUNTER — Ambulatory Visit (INDEPENDENT_AMBULATORY_CARE_PROVIDER_SITE_OTHER): Payer: Commercial Managed Care - PPO | Admitting: Family Medicine

## 2019-09-02 ENCOUNTER — Encounter: Payer: Self-pay | Admitting: Family Medicine

## 2019-09-02 VITALS — BP 138/82 | HR 103 | Temp 95.8°F | Ht 62.0 in | Wt 330.2 lb

## 2019-09-02 DIAGNOSIS — M722 Plantar fascial fibromatosis: Secondary | ICD-10-CM

## 2019-09-02 MED ORDER — KETOROLAC TROMETHAMINE 60 MG/2ML IM SOLN
60.0000 mg | Freq: Once | INTRAMUSCULAR | Status: AC
  Administered 2019-09-02: 60 mg via INTRAMUSCULAR

## 2019-09-02 MED ORDER — PREDNISONE 10 MG (21) PO TBPK: ORAL_TABLET | ORAL | 0 refills | Status: DC

## 2019-09-02 MED ORDER — METHYLPREDNISOLONE ACETATE 80 MG/ML IJ SUSP
80.0000 mg | Freq: Once | INTRAMUSCULAR | Status: AC
  Administered 2019-09-02: 16:00:00 80 mg via INTRAMUSCULAR

## 2019-09-02 NOTE — Patient Instructions (Signed)
Do not start steroid pills until tomorrow.    Plantar Fasciitis  Plantar fasciitis is a painful foot condition that affects the heel. It occurs when the band of tissue that connects the toes to the heel bone (plantar fascia) becomes irritated. This can happen as the result of exercising too much or doing other repetitive activities (overuse injury). The pain from plantar fasciitis can range from mild irritation to severe pain that makes it difficult to walk or move. The pain is usually worse in the morning after sleeping, or after sitting or lying down for a while. Pain may also be worse after long periods of walking or standing. What are the causes? This condition may be caused by:  Standing for long periods of time.  Wearing shoes that do not have good arch support.  Doing activities that put stress on joints (high-impact activities), including running, aerobics, and ballet.  Being overweight.  An abnormal way of walking (gait).  Tight muscles in the back of your lower leg (calf).  High arches in your feet.  Starting a new athletic activity. What are the signs or symptoms? The main symptom of this condition is heel pain. Pain may:  Be worse with first steps after a time of rest, especially in the morning after sleeping or after you have been sitting or lying down for a while.  Be worse after long periods of standing still.  Decrease after 30-45 minutes of activity, such as gentle walking. How is this diagnosed? This condition may be diagnosed based on your medical history and your symptoms. Your health care provider may ask questions about your activity level. Your health care provider will do a physical exam to check for:  A tender area on the bottom of your foot.  A high arch in your foot.  Pain when you move your foot.  Difficulty moving your foot. You may have imaging tests to confirm the diagnosis, such as:  X-rays.  Ultrasound.  MRI. How is this  treated? Treatment for plantar fasciitis depends on how severe your condition is. Treatment may include:  Rest, ice, applying pressure (compression), and raising the affected foot (elevation). This may be called RICE therapy. Your health care provider may recommend RICE therapy along with over-the-counter pain medicines to manage your pain.  Exercises to stretch your calves and your plantar fascia.  A splint that holds your foot in a stretched, upward position while you sleep (night splint).  Physical therapy to relieve symptoms and prevent problems in the future.  Injections of steroid medicine (cortisone) to relieve pain and inflammation.  Stimulating your plantar fascia with electrical impulses (extracorporeal shock wave therapy). This is usually the last treatment option before surgery.  Surgery, if other treatments have not worked after 12 months. Follow these instructions at home:  Managing pain, stiffness, and swelling  If directed, put ice on the painful area: ? Put ice in a plastic bag, or use a frozen bottle of water. ? Place a towel between your skin and the bag or bottle. ? Roll the bottom of your foot over the bag or bottle. ? Do this for 20 minutes, 2-3 times a day.  Wear athletic shoes that have air-sole or gel-sole cushions, or try wearing soft shoe inserts that are designed for plantar fasciitis.  Raise (elevate) your foot above the level of your heart while you are sitting or lying down. Activity  Avoid activities that cause pain. Ask your health care provider what activities are safe for  you.  Do physical therapy exercises and stretches as told by your health care provider.  Try activities and forms of exercise that are easier on your joints (low-impact). Examples include swimming, water aerobics, and biking. General instructions  Take over-the-counter and prescription medicines only as told by your health care provider.  Wear a night splint while sleeping,  if told by your health care provider. Loosen the splint if your toes tingle, become numb, or turn cold and blue.  Maintain a healthy weight, or work with your health care provider to lose weight as needed.  Keep all follow-up visits as told by your health care provider. This is important. Contact a health care provider if you:  Have symptoms that do not go away after caring for yourself at home.  Have pain that gets worse.  Have pain that affects your ability to move or do your daily activities. Summary  Plantar fasciitis is a painful foot condition that affects the heel. It occurs when the band of tissue that connects the toes to the heel bone (plantar fascia) becomes irritated.  The main symptom of this condition is heel pain that may be worse after exercising too much or standing still for a long time.  Treatment varies, but it usually starts with rest, ice, compression, and elevation (RICE therapy) and over-the-counter medicines to manage pain. This information is not intended to replace advice given to you by your health care provider. Make sure you discuss any questions you have with your health care provider. Document Revised: 04/20/2017 Document Reviewed: 03/05/2017 Elsevier Patient Education  2020 [employer].

## 2019-09-02 NOTE — Progress Notes (Signed)
Assessment & Plan:  1. Plantar fasciitis of left foot - Education provided on plantar fasciitis.  Advised patient not to start oral steroid taper until tomorrow since she received steroids IM today. - ketorolac (TORADOL) injection 60 mg - methylPREDNISolone acetate (DEPO-MEDROL) injection 80 mg - predniSONE (STERAPRED UNI-PAK 21 TAB) 10 MG (21) TBPK tablet; As directed x 6 days  Dispense: 21 tablet; Refill: 0   Follow up plan: Return if symptoms worsen or fail to improve.  Hendricks Limes, MSN, APRN, FNP-C [facility]  Subjective:   Patient ID: Diamond Marshall, female    DOB: 1970-08-09, 49 y.o.   MRN: [id number]  HPI: Diamond Marshall is a 49 y.o. female presenting on 09/02/2019 for Plantar Fasciitis (left foot - patient states it has been going on a few months)  Patient is here with concerns of plantar fasciitis of her left foot.  States it has been going on for a few months now.  She has tried taking OTC NSAIDs and rolling her foot on a bottle of frozen water.  She has exercises at home where she has had a plantar fasciitis in the past as she has also been trying to do.  None of her at home efforts have been effective.   ROS: Negative unless specifically indicated above in HPI.   Relevant past medical history reviewed and updated as indicated.   Allergies and medications reviewed and updated.   Current Outpatient Medications:  .  ALPRAZolam (XANAX) 1 MG tablet, Take 1 p.o. x1, 1/2 to 1-hour before procedure, Disp: 2 tablet, Rfl: 0 .  chlorthalidone (HYGROTON) 25 MG tablet, Take 1 tablet (25 mg total) by mouth daily., Disp: 30 tablet, Rfl: 0 .  cyanocobalamin (,VITAMIN B-12,) 1000 MCG/ML injection, INJECT 1 ML INTRAMUSCULARLY EVERY 15 DAYS, Disp: 6 mL, Rfl: 0 .  diclofenac (VOLTAREN) 75 MG EC tablet, Take 1 tablet (75 mg total) by mouth 2 (two) times daily., Disp: 180 tablet, Rfl: 1 .  escitalopram (LEXAPRO) 20 MG tablet, Take 1 tablet (20 mg total) by mouth  daily., Disp: 30 tablet, Rfl: 5 .  levothyroxine (SYNTHROID) 50 MCG tablet, TAKE 1 TABLET DAILY, Disp: 90 tablet, Rfl: 3 .  LORazepam (ATIVAN) 0.5 MG tablet, Take 1 tablet (0.5 mg total) by mouth 2 (two) times daily as needed for anxiety., Disp: 30 tablet, Rfl: 0 .  Norethindrone Acetate-Ethinyl Estradiol (LOESTRIN) 1.5-30 MG-MCG tablet, Take 1 tablet by mouth daily., Disp: 1 Package, Rfl: 11 .  pantoprazole (PROTONIX) 40 MG tablet, Take 1 tablet (40 mg total) by mouth daily. For stomach, Disp: 30 tablet, Rfl: 11 .  Vitamin D, Ergocalciferol, (DRISDOL) 1.25 MG (50000 UNIT) CAPS capsule, Take 1 capsule (50,000 Units total) by mouth every 7 (seven) days., Disp: 4 capsule, Rfl: 0 .  predniSONE (STERAPRED UNI-PAK 21 TAB) 10 MG (21) TBPK tablet, As directed x 6 days, Disp: 21 tablet, Rfl: 0  Allergies  Allergen Reactions  . Phentermine     Became suicidal on the medication  . Topiramate Other (See Comments)    dizziness  . Septra [Sulfamethoxazole-Trimethoprim] Rash    Objective:   BP 138/82   Pulse (!) 103   Temp (!) 95.8 F (35.4 C) (Temporal)   Ht 5\' 2"  (1.575 m)   Wt (!) 330 lb 3.2 oz (149.8 kg)   LMP 08/22/2019   SpO2 95%   BMI 60.39 kg/m    Physical Exam Vitals reviewed.  Constitutional:      General: She  is not in acute distress.    Appearance: Normal appearance. She is morbidly obese. She is not ill-appearing, toxic-appearing or diaphoretic.  HENT:     Head: Normocephalic and atraumatic.  Eyes:     General: No scleral icterus.       Right eye: No discharge.        Left eye: No discharge.     Conjunctiva/sclera: Conjunctivae normal.  Cardiovascular:     Rate and Rhythm: Normal rate.  Pulmonary:     Effort: Pulmonary effort is normal. No respiratory distress.  Musculoskeletal:        General: Normal range of motion.     Cervical back: Normal range of motion.  Feet:     Comments: Tenderness with palpation to left heel. Skin:    General: Skin is warm and dry.      Capillary Refill: Capillary refill takes less than 2 seconds.  Neurological:     General: No focal deficit present.     Mental Status: She is alert and oriented to person, place, and time. Mental status is at baseline.  Psychiatric:        Mood and Affect: Mood normal.        Behavior: Behavior normal.        Thought Content: Thought content normal.        Judgment: Judgment normal.

## 2019-09-06 ENCOUNTER — Encounter: Payer: Self-pay | Admitting: Family Medicine

## 2019-09-08 ENCOUNTER — Telehealth (INDEPENDENT_AMBULATORY_CARE_PROVIDER_SITE_OTHER): Payer: Commercial Managed Care - PPO | Admitting: Psychology

## 2019-09-08 DIAGNOSIS — F3289 Other specified depressive episodes: Secondary | ICD-10-CM | POA: Diagnosis not present

## 2019-09-11 ENCOUNTER — Ambulatory Visit (INDEPENDENT_AMBULATORY_CARE_PROVIDER_SITE_OTHER): Payer: Commercial Managed Care - PPO | Admitting: Bariatrics

## 2019-09-16 NOTE — Progress Notes (Unsigned)
Office: [phone]  /  Fax: [phone]    Date: Sep 29, 2019   Appointment Start Time: *** Duration: *** minutes Provider: Glennie Isle, Psy.D. Type of Session: Individual Therapy  Location of Patient: {gbptloc:23249} Location of Provider: Provider's Home Type of Contact: Telepsychological Visit via MyChart Video Visit  Session Content: Diamond Marshall is a 49 y.o. female presenting via [place] Visit for a follow-up appointment to address the previously established treatment goal of decreasing emotional eating. Today's appointment was a telepsychological visit due to COVID-19. Diamond Marshall provided verbal consent for today's telepsychological appointment and she is aware she is responsible for securing confidentiality on her end of the session. Prior to proceeding with today's appointment, Diamond Marshall's physical location at the time of this appointment was obtained as well a phone number she could be reached at in the event of technical difficulties. Diamond Marshall and this provider participated in today's telepsychological service.   This provider conducted a brief check-in and verbally administered the PHQ-9 and GAD-7. *** Diamond Marshall was receptive to today's appointment as evidenced by openness to sharing, responsiveness to feedback, and {gbreceptiveness:23401}.  Mental Status Examination:  Appearance: {Appearance:22431} Behavior: {Behavior:22445} Mood: {gbmood:21757} Affect: {Affect:22436} Speech: {Speech:22432} Eye Contact: {Eye Contact:22433} Psychomotor Activity: {Motor Activity:22434} Gait: {gbgait:23404} Thought Process: {thought process:22448}  Thought Content/Perception: {disturbances:22451} Orientation: {Orientation:22437} Memory/Concentration: {gbcognition:22449} Insight/Judgment: {Insight:22446}  Structured Assessments Results: The Patient Health Questionnaire-9 (PHQ-9) is a self-report measure that assesses symptoms and severity of depression over the course of the last two weeks.  Diamond Marshall obtained a score of *** suggesting {GBPHQ9SEVERITY:21752}. Diamond Marshall finds the endorsed symptoms to be {gbphq9difficulty:21754}. [0= Not at all; 1= Several days; 2= More than half the days; 3= Nearly every day] Little interest or pleasure in doing things ***  Feeling down, depressed, or hopeless ***  Trouble falling or staying asleep, or sleeping too much ***  Feeling tired or having little energy ***  Poor appetite or overeating ***  Feeling bad about yourself --- or that you are a failure or have let yourself or your family down ***  Trouble concentrating on things, such as reading the newspaper or watching television ***  Moving or speaking so slowly that other people could have noticed? Or the opposite --- being so fidgety or restless that you have been moving around a lot more than usual ***  Thoughts that you would be better off dead or hurting yourself in some way ***  PHQ-9 Score ***    The Generalized Anxiety Disorder-7 (GAD-7) is a brief self-report measure that assesses symptoms of anxiety over the course of the last two weeks. Diamond Marshall obtained a score of *** suggesting {gbgad7severity:21753}. Diamond Marshall finds the endorsed symptoms to be {gbphq9difficulty:21754}. [0= Not at all; 1= Several days; 2= Over half the days; 3= Nearly every day] Feeling nervous, anxious, on edge ***  Not being able to stop or control worrying ***  Worrying too much about different things ***  Trouble relaxing ***  Being so restless that it's hard to sit still ***  Becoming easily annoyed or irritable ***  Feeling afraid as if something awful might happen ***  GAD-7 Score ***   Interventions:  {Interventions for Progress Notes:23405}  DSM-5 Diagnosis(es): 311 (F32.8) Other Specified Depressive Disorder, Emotional Eating Behaviors  Treatment Goal & Progress: During the initial appointment with this provider, the following treatment goal was established: decrease emotional eating. Diamond Marshall has  demonstrated progress in her goal as evidenced by {gbtxprogress:22839}. Diamond Marshall also {gbtxprogress2:22951}.  Plan: The next appointment will be scheduled in {  gbweeks:21758}, which will be {gbtxmodality:23402}. The next session will focus on {Plan for Next Appointment:23400}.

## 2019-09-23 ENCOUNTER — Other Ambulatory Visit: Payer: Self-pay

## 2019-09-23 ENCOUNTER — Ambulatory Visit (INDEPENDENT_AMBULATORY_CARE_PROVIDER_SITE_OTHER): Payer: Commercial Managed Care - PPO | Admitting: Bariatrics

## 2019-09-23 ENCOUNTER — Encounter (INDEPENDENT_AMBULATORY_CARE_PROVIDER_SITE_OTHER): Payer: Self-pay | Admitting: Bariatrics

## 2019-09-23 VITALS — BP 136/81 | HR 86 | Temp 98.7°F | Ht 62.0 in | Wt 324.0 lb

## 2019-09-23 DIAGNOSIS — K5909 Other constipation: Secondary | ICD-10-CM

## 2019-09-23 DIAGNOSIS — E038 Other specified hypothyroidism: Secondary | ICD-10-CM | POA: Diagnosis not present

## 2019-09-23 DIAGNOSIS — E559 Vitamin D deficiency, unspecified: Secondary | ICD-10-CM | POA: Diagnosis not present

## 2019-09-23 DIAGNOSIS — Z9189 Other specified personal risk factors, not elsewhere classified: Secondary | ICD-10-CM | POA: Diagnosis not present

## 2019-09-23 DIAGNOSIS — Z6841 Body Mass Index (BMI) 40.0 and over, adult: Secondary | ICD-10-CM

## 2019-09-23 MED ORDER — VITAMIN D (ERGOCALCIFEROL) 1.25 MG (50000 UNIT) PO CAPS: 50000.0000 [IU] | ORAL_CAPSULE | ORAL | 0 refills | Status: DC

## 2019-09-23 NOTE — Progress Notes (Signed)
Chief Complaint:   OBESITY Diamond Marshall is here to discuss her progress with her obesity treatment plan along with follow-up of her obesity related diagnoses. Diamond Marshall is on the Category 4 Plan and states she is following her eating plan approximately 40% of the time. Diamond Marshall states she is walking 3,000 steps 5 times per week.  Today's visit was #: 4 Starting weight: 336 lbs Starting date: 07/21/2019 Today's weight: 324 lbs Today's date: 09/23/2019 Total lbs lost to date: 12 Total lbs lost since last in-office visit: 3  Interim History: Diamond Marshall is down 3 lbs and doing well overall. She has been "slowed down" due to cortisone.  Subjective:   Vitamin D deficiency. Diamond Marshall is taking high dose Vitamin D. Last Vitamin D 12.9 on 07/21/2019.  Other specified hypothyroidism. Diamond Marshall is taking Synthroid.  Lab Results  Component Value Date   TSH 3.650 04/03/2019   Other constipation. Diamond Marshall is taking sugar-free Citracal OTC. She reports hard stool secondary to iron supplementation.  At risk for osteoporosis. Diamond Marshall is at higher risk of osteopenia and osteoporosis due to Vitamin D deficiency.   Assessment/Plan:   Vitamin D deficiency. Low Vitamin D level contributes to fatigue and are associated with obesity, breast, and colon cancer. She was given a prescription for Vitamin D, Ergocalciferol, (DRISDOL) 1.25 MG (50000 UNIT) CAPS capsule every week #4 with 0 refills and will follow-up for routine testing of Vitamin D, at least 2-3 times per year to avoid over-replacement.    Other specified hypothyroidism. Patient with long-standing hypothyroidism, on levothyroxine therapy. She appears euthyroid. Orders and follow up as documented in patient record. Carlena will continue Synthroid as directed.  Counseling . Good thyroid control is important for overall health. Supratherapeutic thyroid levels are dangerous and will not improve weight loss results. . The correct way to take  levothyroxine is fasting, with water, separated by at least 30 minutes from breakfast, and separated by more than 4 hours from calcium, iron, multivitamins, acid reflux medications (PPIs).   Other constipation. Diamond Marshall was informed that a decrease in bowel movement frequency is normal while losing weight, but stools should not be hard or painful. Orders and follow up as documented in patient record. She will use Citracal OTC or MiraLax.  Counseling Getting to Good Bowel Health: Your goal is to have one soft bowel movement each day. Drink at least 8 glasses of water each day. Eat plenty of fiber (goal is over 25 grams each day). It is best to get most of your fiber from dietary sources which includes leafy green vegetables, fresh fruit, and whole grains. You may need to add fiber with the help of OTC fiber supplements. These include Metamucil, Citrucel, and Flaxseed. If you are still having trouble, try adding Miralax or Magnesium Citrate. If all of these changes do not work, Cabin [employer].  At risk for osteoporosis. Diamond Marshall was given approximately 15 minutes of osteoporosis prevention counseling today. Diamond Marshall is at risk for osteopenia and osteoporosis due to her Vitamin D deficiency. She was encouraged to take her Vitamin D and follow her higher calcium diet and increase strengthening exercise to help strengthen her bones and decrease her risk of osteopenia and osteoporosis.  Repetitive spaced learning was employed today to elicit superior memory formation and behavioral change.  Class 3 severe obesity with serious comorbidity and body mass index (BMI) of 50.0 to 59.9 in adult, unspecified obesity type ([place]).  Diamond Marshall is currently in the action stage of change. As  such, her goal is to continue with weight loss efforts. She has agreed to the Category 4 Plan.   She will work on meal planning and intentional eating.  Exercise goals: Diamond Marshall will start walking more and do NEAT  exercises.  Behavioral modification strategies: increasing lean protein intake, decreasing simple carbohydrates, increasing vegetables, increasing water intake, decreasing eating out, no skipping meals, meal planning and cooking strategies, keeping healthy foods in the home and planning for success.  Diamond Marshall has agreed to follow-up with our clinic in 2 weeks. She was informed of the importance of frequent follow-up visits to maximize her success with intensive lifestyle modifications for her multiple health conditions.   Objective:   Blood pressure 136/81, pulse 86, temperature 98.7 F (37.1 C), height 5\' 2"  (1.575 m), weight (!) 324 lb (147 kg), last menstrual period 09/13/2019, SpO2 97 %. Body mass index is 59.26 kg/m.  General: Cooperative, alert, well developed, in no acute distress. HEENT: Conjunctivae and lids unremarkable. Cardiovascular: Regular rhythm.  Lungs: Normal work of breathing. Neurologic: No focal deficits.   Lab Results  Component Value Date   CREATININE 0.70 04/03/2019   BUN 14 04/03/2019   NA 139 04/03/2019   K 4.4 04/03/2019   CL 101 04/03/2019   CO2 25 04/03/2019   Lab Results  Component Value Date   ALT 19 04/03/2019   AST 14 04/03/2019   ALKPHOS 122 (H) 04/03/2019   BILITOT <0.2 04/03/2019   Lab Results  Component Value Date   HGBA1C 5.7 (H) 07/21/2019   HGBA1C 5.6 01/02/2018   Lab Results  Component Value Date   INSULIN 19.8 07/21/2019   Lab Results  Component Value Date   TSH 3.650 04/03/2019   Lab Results  Component Value Date   CHOL 149 07/21/2019   HDL 44 07/21/2019   LDLCALC 80 07/21/2019   TRIG 143 07/21/2019   Lab Results  Component Value Date   WBC 10.6 04/03/2019   HGB 11.7 04/03/2019   HCT 36.4 04/03/2019   MCV 77 (L) 04/03/2019   PLT 353 04/03/2019   Lab Results  Component Value Date   IRON 31 06/17/2018   TIBC 305 06/17/2018   FERRITIN 53 06/17/2018   Attestation Statements:   Reviewed by clinician on day of  visit: allergies, medications, problem list, medical history, surgical history, family history, social history, and previous encounter notes.  Migdalia Dk, am acting as Location manager for [employer], DO   I have reviewed the above documentation for accuracy and completeness, and I agree with the above. [name], DO

## 2019-09-26 ENCOUNTER — Encounter: Payer: Self-pay | Admitting: Family Medicine

## 2019-09-29 ENCOUNTER — Telehealth (INDEPENDENT_AMBULATORY_CARE_PROVIDER_SITE_OTHER): Payer: Commercial Managed Care - PPO | Admitting: Psychology

## 2019-10-02 ENCOUNTER — Other Ambulatory Visit: Payer: Self-pay

## 2019-10-02 ENCOUNTER — Telehealth (INDEPENDENT_AMBULATORY_CARE_PROVIDER_SITE_OTHER): Payer: Commercial Managed Care - PPO | Admitting: Psychology

## 2019-10-02 ENCOUNTER — Other Ambulatory Visit (INDEPENDENT_AMBULATORY_CARE_PROVIDER_SITE_OTHER): Payer: Self-pay | Admitting: Bariatrics

## 2019-10-02 DIAGNOSIS — F3289 Other specified depressive episodes: Secondary | ICD-10-CM | POA: Diagnosis not present

## 2019-10-02 DIAGNOSIS — I10 Essential (primary) hypertension: Secondary | ICD-10-CM

## 2019-10-02 NOTE — Progress Notes (Signed)
Office: [phone]  /  Fax: [phone]    Date: Oct 02, 2019    Appointment Start Time: 11:04am Duration: 23 minutes Provider: Glennie Isle, Psy.D. Type of Session: Individual Therapy  Location of Patient: Work Location of Provider: Provider's Home Type of Contact: Telepsychological Visit via MyChart Video Visit  Session Content: This provider called Diamond Marshall at 11:02am as she did not present for the telepsychological appointment. She indicated she had the appointment written down for 2pm, but noted she could join. As such, today's appointment was initiated 4 minutes late. Diamond Marshall is a 49 y.o. female presenting via [place] Visit for a follow-up appointment to address the previously established treatment goal of decreasing emotional eating. Today's appointment was a telepsychological visit due to COVID-19. Diamond Marshall provided verbal consent for today's telepsychological appointment and she is aware she is responsible for securing confidentiality on her end of the session. Prior to proceeding with today's appointment, Diamond Marshall's physical location at the time of this appointment was obtained as well a phone number she could be reached at in the event of technical difficulties. Diamond Marshall and this provider participated in today's telepsychological service.   This provider conducted a brief check-in. Diamond Marshall reported she "got off track" with her eating. This was explored and she noted there has been a lack of planning. She also discussed focusing on others has resulting in being too tired to focus on her eating. Psychoeducation regarding the importance of self-care utilizing the oxygen mask metaphor was provided. Psychoeducation regarding pleasurable activities, including its impact on emotional eating and overall well-being was also provided. Diamond Marshall was provided with a handout with various options of pleasurable activities, and was encouraged to engage in one activity a day and additional activities  as needed when triggered to emotionally eat. Diamond Marshall agreed. Diamond Marshall provided verbal consent during today's appointment for this provider to send a handout about pleasurable activities via e-mail. Moreover, psychoeducation regarding SMART goals was provided and Diamond Marshall was engaged in goal setting. The following goal was established: Diamond Marshall will eat breakfst congruent to her her meal plan at least 4 out of 7 days between now and the next appointment with this provider. Overall, Diamond Marshall was receptive to today's appointment as evidenced by openness to sharing, responsiveness to feedback, and willingness to implement discussed strategies .  Mental Status Examination:  Appearance: well groomed and appropriate hygiene  Behavior: appropriate to circumstances Mood: euthymic Affect: mood congruent Speech: normal in rate, volume, and tone Eye Contact: appropriate Psychomotor Activity: appropriate Gait: unable to assess Thought Process: linear, logical, and goal directed  Thought Content/Perception: no hallucinations, delusions, bizarre thinking or behavior reported or observed and no evidence of suicidal and homicidal ideation, plan, and intent Orientation: time, person, place and purpose of appointment Memory/Concentration: memory, attention, language, and fund of knowledge intact  Insight/Judgment: good   Interventions:  Conducted a brief chart review Provided empathic reflections and validation Employed supportive psychotherapy interventions to facilitate reduced distress and to improve coping skills with identified stressors Employed motivational interviewing skills to assess patient's willingness/desire to adhere to recommended medical treatments and assignments Engaged patient in goal setting Psychoeducation provided regarding pleasurable activities Psychoeducation provided regarding self-care Psychoeducation regarding SMART goals provided  DSM-5 Diagnosis(es): 311 (F32.8) Other Specified  Depressive Disorder, Emotional Eating Behaviors  Treatment Goal & Progress: During the initial appointment with this provider, the following treatment goal was established: decrease emotional eating. Diamond Marshall has demonstrated progress in her goal as evidenced by increased awareness of hunger patterns and increased awareness of  triggers for emotional eating. Diamond Marshall also continues to demonstrate willingness to engage in learned skill(s).  Plan: The next appointment will be scheduled in three weeks, which will be via MyChart Video Visit. The next session will focus on working towards the established treatment goal.

## 2019-10-14 ENCOUNTER — Ambulatory Visit (INDEPENDENT_AMBULATORY_CARE_PROVIDER_SITE_OTHER): Payer: Commercial Managed Care - PPO | Admitting: Bariatrics

## 2019-10-23 ENCOUNTER — Ambulatory Visit (INDEPENDENT_AMBULATORY_CARE_PROVIDER_SITE_OTHER): Payer: Commercial Managed Care - PPO | Admitting: Bariatrics

## 2019-10-23 ENCOUNTER — Telehealth (INDEPENDENT_AMBULATORY_CARE_PROVIDER_SITE_OTHER): Payer: Commercial Managed Care - PPO | Admitting: Psychology

## 2019-10-23 ENCOUNTER — Other Ambulatory Visit: Payer: Self-pay

## 2019-10-23 ENCOUNTER — Encounter (INDEPENDENT_AMBULATORY_CARE_PROVIDER_SITE_OTHER): Payer: Self-pay | Admitting: Bariatrics

## 2019-10-23 VITALS — BP 153/77 | HR 89 | Temp 98.4°F | Ht 62.0 in | Wt 330.0 lb

## 2019-10-23 DIAGNOSIS — I10 Essential (primary) hypertension: Secondary | ICD-10-CM | POA: Diagnosis not present

## 2019-10-23 DIAGNOSIS — Z6841 Body Mass Index (BMI) 40.0 and over, adult: Secondary | ICD-10-CM

## 2019-10-23 DIAGNOSIS — E559 Vitamin D deficiency, unspecified: Secondary | ICD-10-CM

## 2019-10-23 DIAGNOSIS — Z9189 Other specified personal risk factors, not elsewhere classified: Secondary | ICD-10-CM

## 2019-10-23 DIAGNOSIS — R7303 Prediabetes: Secondary | ICD-10-CM

## 2019-10-23 DIAGNOSIS — F3289 Other specified depressive episodes: Secondary | ICD-10-CM

## 2019-10-23 MED ORDER — LISINOPRIL-HYDROCHLOROTHIAZIDE 20-25 MG PO TABS: 1.0000 | ORAL_TABLET | Freq: Every day | ORAL | 1 refills | Status: DC

## 2019-10-23 NOTE — Progress Notes (Signed)
  Office: [phone]  /  Fax: [phone]    Date: October 23, 2019    Appointment Start Time: 1:54pm Duration: 25 minutes Provider: Glennie Isle, Psy.D. Type of Session: Individual Therapy  Location of Patient: Work Location of Provider: Provider's Home Type of Contact: Telepsychological Visit via MyChart Video Visit  Session Content: Pati is a 49 y.o. female presenting via [place] Visit for a follow-up appointment to address the previously established treatment goal of decreasing emotional eating. Today's appointment was a telepsychological visit due to COVID-19. Abbeygail provided verbal consent for today's telepsychological appointment and she is aware she is responsible for securing confidentiality on her end of the session. Prior to proceeding with today's appointment, Cassondra's physical location at the time of this appointment was obtained as well a phone number she could be reached at in the event of technical difficulties. Jaylianie and this provider participated in today's telepsychological service.   This provider conducted a brief check-in. Quanah shared about recent events. She acknowledged recent weight gain due to celebrations and eating out. Harolyn expressed desire to "get back on track" and discussed changes she has already started to make (E.g., having a sandwich per her meal plan for lunch today). Previously discussed strategies to assist with celebrations were reviewed. She expressed willingness to implement the strategies during a celebration tomorrow. Despite deviations, she acknowledged an overall reduction in emotional eating. A plan was developed to help [place] cope with emotional eating in the future using learned skills. She wrote down the following plan: focus on hydration; be prepared with snacks congruent to the meal plan; pause to ask questions when triggered to eat (e.g., Am I really hungry?, Is there something bothering me?, and Will I feel better if I eat?);  and engage in discussed coping strategies after going through the aforementioned questions. Carmen was receptive to today's appointment as evidenced by openness to sharing, responsiveness to feedback, and willingness to continue engaging in learned skills.  Mental Status Examination:  Appearance: well groomed and appropriate hygiene  Behavior: appropriate to circumstances Mood: euthymic Affect: mood congruent Speech: normal in rate, volume, and tone Eye Contact: appropriate Psychomotor Activity: appropriate Gait: unable to assess Thought Process: linear, logical, and goal directed  Thought Content/Perception: no hallucinations, delusions, bizarre thinking or behavior reported or observed and no evidence of suicidal and homicidal ideation, plan, and intent Orientation: time, person, place and purpose of appointment Memory/Concentration: memory, attention, language, and fund of knowledge intact  Insight/Judgment: good  Interventions:  Conducted a brief chart review Provided empathic reflections and validation Employed supportive psychotherapy interventions to facilitate reduced distress and to improve coping skills with identified stressors Employed motivational interviewing skills to assess patient's willingness/desire to adhere to recommended medical treatments and assignments Reviewed learned skills  DSM-5 Diagnosis(es): 311 (F32.8) Other Specified Depressive Disorder, Emotional Eating Behaviors  Treatment Goal & Progress: During the initial appointment with this provider, the following treatment goal was established: decrease emotional eating. Braleigh demonstrated progress in her goal as evidenced by increased awareness of hunger patterns, increased awareness of triggers for emotional eating and reduction in emotional eating. Malcolm also continues to demonstrate willingness to engage in learned skill(s).  Plan: As previously planned, today was Carmisha's last appointment with this  provider. She acknowledged understanding that she may request a follow-up appointment with this provider in the future as long as she is still established with the clinic. No further follow-up planned by this provider.

## 2019-10-23 NOTE — Progress Notes (Signed)
Chief Complaint:   OBESITY Diamond Marshall is here to discuss her progress with her obesity treatment plan along with follow-up of her obesity related diagnoses. Diamond Marshall is on the Category 4 Plan and states she is following her eating plan approximately 30% of the time. Diamond Marshall states she is walking 10-15 minutes 3 times per week.  Today's visit was #: 5 Starting weight: 336 lbs Starting date: 07/21/2019 Today's weight: 330 lbs Today's date: 10/23/2019 Total lbs lost to date: 6 Total lbs lost since last in-office visit: 0  Interim History: Diamond Marshall is up 6 lbs and only doing 30% of her plan. She has previously done well. She has had more celebrations.  Subjective:   Essential hypertension. Diamond Marshall is taking chlorthalidone. Blood pressure is elevated today.  BP Readings from Last 3 Encounters:  10/23/19 (!) 153/77  09/23/19 136/81  09/02/19 138/82   Lab Results  Component Value Date   CREATININE 0.70 04/03/2019   CREATININE 0.74 06/17/2018   CREATININE 0.77 01/02/2018   Vitamin D deficiency. Diamond Marshall is taking Vitamin D supplementation. Last Vitamin D was 12.9 on 07/21/2019.  Prediabetes. Diamond Marshall has a diagnosis of prediabetes based on her elevated HgA1c and was informed this puts her at greater risk of developing diabetes. She continues to work on diet and exercise to decrease her risk of diabetes. She denies nausea or hypoglycemia. Diamond Marshall is on no medications.  Lab Results  Component Value Date   HGBA1C 5.7 (H) 07/21/2019   Lab Results  Component Value Date   INSULIN 19.8 07/21/2019   At risk for osteoporosis. Diamond Marshall is at higher risk of osteopenia and osteoporosis due to Vitamin D deficiency.   Assessment/Plan:   Essential hypertension. Diamond Marshall is working on healthy weight loss and exercise to improve blood pressure control. We will watch for signs of hypotension as she continues her lifestyle modifications. Diamond Marshall will stop chlorthalidone and will start  lisinopril-hydrochlorothiazide (ZESTORETIC) 20-25 MG tablet 1 daily #30 with 0 refills. Comprehensive metabolic panel ordered today.  Vitamin D deficiency. Low Vitamin D level contributes to fatigue and are associated with obesity, breast, and colon cancer. VITAMIN D 25 Hydroxy (Vit-D Deficiency, Fractures) level ordered today.  Prediabetes. Diamond Marshall will continue to work on weight loss, exercise, increasing healthy fats and protein, and decreasing simple carbohydrates to help decrease the risk of diabetes. Hemoglobin A1c, Insulin, random labs ordered today.  At risk for osteoporosis. Diamond Marshall was given approximately 15 minutes of osteoporosis prevention counseling today. Diamond Marshall is at risk for osteopenia and osteoporosis due to her Vitamin D deficiency. She was encouraged to take her Vitamin D and follow her higher calcium diet and increase strengthening exercise to help strengthen her bones and decrease her risk of osteopenia and osteoporosis.  Repetitive spaced learning was employed today to elicit superior memory formation and behavioral change.  Class 3 severe obesity with serious comorbidity and body mass index (BMI) of 60.0 to 69.9 in adult, unspecified obesity type (Diamond Marshall).  Diamond Marshall is currently in the action stage of change. As such, her goal is to continue with weight loss efforts. She has agreed to the Category 4 Plan.   She will work on meal planning and intentional eating.   Exercise goals: All adults should avoid inactivity. Some physical activity is better than none, and adults who participate in any amount of physical activity gain some health benefits.  Behavioral modification strategies: increasing lean protein intake, decreasing simple carbohydrates, increasing vegetables, increasing water intake, decreasing eating out, no skipping  meals, meal planning and cooking strategies, keeping healthy foods in the home and planning for success.  Diamond Marshall has agreed to follow-up with our  clinic in 2-3 weeks. She was informed of the importance of frequent follow-up visits to maximize her success with intensive lifestyle modifications for her multiple health conditions.   Diamond Marshall was informed we would discuss her lab results at her next visit unless there is a critical issue that needs to be addressed sooner. Diamond Marshall agreed to keep her next visit at the agreed upon time to discuss these results.  Objective:   Blood pressure (!) 153/77, pulse 89, temperature 98.4 F (36.9 C), height 5\' 2"  (1.575 m), weight (!) 330 lb (149.7 kg), SpO2 98 %. Body mass index is 60.36 kg/m.  General: Cooperative, alert, well developed, in no acute distress. HEENT: Conjunctivae and lids unremarkable. Cardiovascular: Regular rhythm.  Lungs: Normal work of breathing. Neurologic: No focal deficits.   Lab Results  Component Value Date   CREATININE 0.70 04/03/2019   BUN 14 04/03/2019   NA 139 04/03/2019   K 4.4 04/03/2019   CL 101 04/03/2019   CO2 25 04/03/2019   Lab Results  Component Value Date   ALT 19 04/03/2019   AST 14 04/03/2019   ALKPHOS 122 (H) 04/03/2019   BILITOT <0.2 04/03/2019   Lab Results  Component Value Date   HGBA1C 5.7 (H) 07/21/2019   HGBA1C 5.6 01/02/2018   Lab Results  Component Value Date   INSULIN 19.8 07/21/2019   Lab Results  Component Value Date   TSH 3.650 04/03/2019   Lab Results  Component Value Date   CHOL 149 07/21/2019   HDL 44 07/21/2019   LDLCALC 80 07/21/2019   TRIG 143 07/21/2019   Lab Results  Component Value Date   WBC 10.6 04/03/2019   HGB 11.7 04/03/2019   HCT 36.4 04/03/2019   MCV 77 (L) 04/03/2019   PLT 353 04/03/2019   Lab Results  Component Value Date   IRON 31 06/17/2018   TIBC 305 06/17/2018   FERRITIN 53 06/17/2018   Attestation Statements:   Reviewed by clinician on day of visit: allergies, medications, problem list, medical history, surgical history, family history, social history, and previous encounter  notes.  Migdalia Dk, am acting as Location manager for [employer], DO   I have reviewed the above documentation for accuracy and completeness, and I agree with the above. [name], DO

## 2019-10-24 LAB — COMPREHENSIVE METABOLIC PANEL
ALT: 18 IU/L (ref 0–32)
AST: 12 IU/L (ref 0–40)
Albumin/Globulin Ratio: 1.4 (ref 1.2–2.2)
Albumin: 4.1 g/dL (ref 3.8–4.8)
Alkaline Phosphatase: 105 IU/L (ref 48–121)
BUN/Creatinine Ratio: 15 (ref 9–23)
BUN: 12 mg/dL (ref 6–24)
Bilirubin Total: <0.2 mg/dL (ref 0.0–1.2)
CO2: 24 mmol/L (ref 20–29)
Calcium: 9 mg/dL (ref 8.7–10.2)
Chloride: 101 mmol/L (ref 96–106)
Creatinine, Ser: 0.82 mg/dL (ref 0.57–1.00)
GFR calc Af Amer: 98 mL/min/{1.73_m2} (ref >59)
GFR calc non Af Amer: 85 mL/min/{1.73_m2} (ref >59)
Globulin, Total: 3 g/dL (ref 1.5–4.5)
Glucose: 91 mg/dL (ref 65–99)
Potassium: 4.6 mmol/L (ref 3.5–5.2)
Sodium: 140 mmol/L (ref 134–144)
Total Protein: 7.1 g/dL (ref 6.0–8.5)

## 2019-10-24 LAB — HEMOGLOBIN A1C
Est. average glucose Bld gHb Est-mCnc: 128 mg/dL
Hgb A1c MFr Bld: 6.1 % — ABNORMAL HIGH (ref 4.8–5.6)

## 2019-10-24 LAB — INSULIN, RANDOM: INSULIN: 22.5 u[IU]/mL (ref 2.6–24.9)

## 2019-10-24 LAB — VITAMIN D 25 HYDROXY (VIT D DEFICIENCY, FRACTURES): Vit D, 25-Hydroxy: 29.2 ng/mL — ABNORMAL LOW (ref 30.0–100.0)

## 2019-10-27 ENCOUNTER — Emergency Department (HOSPITAL_COMMUNITY): Payer: Commercial Managed Care - PPO

## 2019-10-27 ENCOUNTER — Emergency Department (HOSPITAL_COMMUNITY)
Admission: EM | Admit: 2019-10-27 | Discharge: 2019-10-28 | Disposition: A | Discharge status: Home / Self Care | Payer: Commercial Managed Care - PPO | Attending: Emergency Medicine | Admitting: Emergency Medicine

## 2019-10-27 ENCOUNTER — Encounter (HOSPITAL_COMMUNITY): Payer: Self-pay | Admitting: Emergency Medicine

## 2019-10-27 ENCOUNTER — Other Ambulatory Visit: Payer: Self-pay

## 2019-10-27 DIAGNOSIS — R1084 Generalized abdominal pain: Secondary | ICD-10-CM | POA: Insufficient documentation

## 2019-10-27 DIAGNOSIS — Z888 Allergy status to other drugs, medicaments and biological substances status: Secondary | ICD-10-CM | POA: Diagnosis not present

## 2019-10-27 DIAGNOSIS — K529 Noninfective gastroenteritis and colitis, unspecified: Secondary | ICD-10-CM

## 2019-10-27 DIAGNOSIS — R112 Nausea with vomiting, unspecified: Secondary | ICD-10-CM | POA: Diagnosis not present

## 2019-10-27 DIAGNOSIS — R197 Diarrhea, unspecified: Secondary | ICD-10-CM | POA: Diagnosis present

## 2019-10-27 DIAGNOSIS — E039 Hypothyroidism, unspecified: Secondary | ICD-10-CM | POA: Insufficient documentation

## 2019-10-27 LAB — BASIC METABOLIC PANEL
Anion gap: 12 (ref 5–15)
BUN: 19 mg/dL (ref 6–20)
CO2: 24 mmol/L (ref 22–32)
Calcium: 8.8 mg/dL — ABNORMAL LOW (ref 8.9–10.3)
Chloride: 101 mmol/L (ref 98–111)
Creatinine, Ser: 1.14 mg/dL — ABNORMAL HIGH (ref 0.44–1.00)
GFR calc Af Amer: >60 mL/min (ref >60)
GFR calc non Af Amer: 57 mL/min — ABNORMAL LOW (ref >60)
Glucose, Bld: 153 mg/dL — ABNORMAL HIGH (ref 70–99)
Potassium: 3.4 mmol/L — ABNORMAL LOW (ref 3.5–5.1)
Sodium: 137 mmol/L (ref 135–145)

## 2019-10-27 LAB — LIPASE, BLOOD: Lipase: 23 U/L (ref 11–51)

## 2019-10-27 LAB — CBC
HCT: 43.3 % (ref 36.0–46.0)
Hemoglobin: 13.6 g/dL (ref 12.0–15.0)
MCH: 25 pg — ABNORMAL LOW (ref 26.0–34.0)
MCHC: 31.4 g/dL (ref 30.0–36.0)
MCV: 79.4 fL — ABNORMAL LOW (ref 80.0–100.0)
Platelets: 541 10*3/uL — ABNORMAL HIGH (ref 150–400)
RBC: 5.45 MIL/uL — ABNORMAL HIGH (ref 3.87–5.11)
RDW: 16.4 % — ABNORMAL HIGH (ref 11.5–15.5)
WBC: 19.1 10*3/uL — ABNORMAL HIGH (ref 4.0–10.5)
nRBC: 0 % (ref 0.0–0.2)

## 2019-10-27 LAB — HEPATIC FUNCTION PANEL
ALT: 23 U/L (ref 0–44)
AST: 17 U/L (ref 15–41)
Albumin: 3.3 g/dL — ABNORMAL LOW (ref 3.5–5.0)
Alkaline Phosphatase: 84 U/L (ref 38–126)
Bilirubin, Direct: 0.1 mg/dL (ref 0.0–0.2)
Indirect Bilirubin: 0.4 mg/dL (ref 0.3–0.9)
Total Bilirubin: 0.5 mg/dL (ref 0.3–1.2)
Total Protein: 7.4 g/dL (ref 6.5–8.1)

## 2019-10-27 LAB — HCG, SERUM, QUALITATIVE: Preg, Serum: NEGATIVE

## 2019-10-27 MED ORDER — ONDANSETRON HCL 4 MG/2ML IJ SOLN
4.0000 mg | Freq: Once | INTRAMUSCULAR | Status: AC
  Administered 2019-10-27: 4 mg via INTRAVENOUS
  Filled 2019-10-27: qty 2

## 2019-10-27 MED ORDER — IOHEXOL 300 MG/ML  SOLN
100.0000 mL | Freq: Once | INTRAMUSCULAR | Status: AC | PRN
  Administered 2019-10-27: 100 mL via INTRAVENOUS

## 2019-10-27 MED ORDER — SODIUM CHLORIDE 0.9 % IV BOLUS
1000.0000 mL | Freq: Once | INTRAVENOUS | Status: AC
  Administered 2019-10-27: 1000 mL via INTRAVENOUS

## 2019-10-27 MED ORDER — DICYCLOMINE HCL 10 MG/ML IM SOLN
20.0000 mg | Freq: Once | INTRAMUSCULAR | Status: AC
  Administered 2019-10-27: 20 mg via INTRAMUSCULAR
  Filled 2019-10-27: qty 2

## 2019-10-27 MED ORDER — MORPHINE SULFATE (PF) 4 MG/ML IV SOLN
4.0000 mg | Freq: Once | INTRAVENOUS | Status: AC
  Administered 2019-10-27: 4 mg via INTRAVENOUS
  Filled 2019-10-27: qty 1

## 2019-10-27 NOTE — ED Provider Notes (Signed)
Emergency Department Provider Note   I have reviewed the triage vital signs and the nursing notes.   HISTORY  Chief Complaint Emesis (diarrhea)   HPI Diamond Marshall is a 49 y.o. female with PMH reviewed below presents to the ED for evaluation of acute onset nausea, vomiting, and diarrhea.  Symptoms began shortly after dinner today.  Patient suspects that she ate a bad salad from [place] which started symptoms although she ate out for lunch.  She states that she initially felt like she had some gas discomfort so went ahead and ate dinner but shortly after began vomiting.  She had multiple episodes of vomiting and diarrhea.  She became very lightheaded, weak, diaphoretic and so called EMS.  She did not have chest pain or shortness of breath.  She denies any blood in the emesis or stool.  Denies fever.  No sick contacts.  No one else ate the salad that she ate at lunch.  She is describing diffuse abdominal discomfort without radiation of symptoms.   Past Medical History:  Diagnosis Date  . Anemia    b12 deficiency  . Anxiety   . B12 deficiency   . Back pain   . Depression   . Gallbladder problem   . GERD (gastroesophageal reflux disease)   . Hypothyroidism   . Joint pain   . Knee pain   . Palpitation   . Pernicious anemia   . SOB (shortness of breath)   . Thyroid disease    hypothyroidism  . Vitamin D deficiency     Patient Active Problem List   Diagnosis Date Noted  . Prediabetes 07/22/2019  . Anxiety and depression 03/15/2016  . Vitamin D deficiency 03/15/2016  . Metabolic syndrome X 25/36/6440  . Elevated random blood glucose level 08/07/2013  . Hypertriglyceridemia 08/07/2013  . ADD (attention deficit disorder) 07/24/2013  .  hyperlipidemia 10/09/2012  . Morbid obesity ([place]) 10/09/2012  . Hypothyroidism 10/09/2012  . B12 deficiency 10/09/2012  . Anemia, unspecified 02/12/2011  . Dysthymic disorder 02/12/2011  . Migraine headache 02/12/2011  .  Obsessive-compulsive disorder 02/12/2011  . Tinnitus 02/12/2011    Past Surgical History:  Procedure Laterality Date  . CESAREAN SECTION  1999  . Dilatation and currettagement     for miscarriage 18 years ago    Allergies Phentermine, Topiramate, and Septra [sulfamethoxazole-trimethoprim]  Family History  Problem Relation Age of Onset  . Hypertension Mother   . Diabetes Mother   . High Cholesterol Mother   . Thyroid disease Mother   . Depression Mother   . Anxiety disorder Mother   . Obesity Mother   . CVA Father        hemorrhagic    Social History Social History   Tobacco Use  . Smoking status: Never Smoker  . Smokeless tobacco: Never Used  Substance Use Topics  . Alcohol use: No  . Drug use: No    Review of Systems  Constitutional: No fever/chills Eyes: No visual changes. ENT: No sore throat. Cardiovascular: Denies chest pain. Respiratory: Denies shortness of breath. Gastrointestinal: Positive diffuse abdominal pain. Positive nausea, vomiting, and diarrhea.  No constipation. Genitourinary: Negative for dysuria. No vaginal bleeding or discharge.  Musculoskeletal: Negative for back pain. Skin: Negative for rash. Neurological: Negative for headaches  10-point ROS otherwise negative.  ____________________________________________   PHYSICAL EXAM:  VITAL SIGNS: ED Triage Vitals  Enc Vitals Group     BP 10/27/19 2202 (!) 108/58     Pulse Rate 10/27/19 2202  87     Resp 10/27/19 2202 20     Temp --      Temp Source 10/27/19 2202 Oral     SpO2 10/27/19 2202 98 %     Weight 10/27/19 2204 (!) 330 lb (149.7 kg)     Height 10/27/19 2204 5\' 2"  (1.575 m)   Constitutional: Alert and oriented. Conversational but appears fatigued and mildly uncomfortable.  Eyes: Conjunctivae are normal.  Head: Atraumatic. Nose: No congestion/rhinnorhea. Mouth/Throat: Mucous membranes are moist.  Neck: No stridor.   Cardiovascular: Normal rate, regular rhythm. Good  peripheral circulation. Grossly normal heart sounds.   Respiratory: Normal respiratory effort.  No retractions. Lungs CTAB. Gastrointestinal: Soft with diffuse mild tenderness. No focal tenderness, rebound, or guarding.  Musculoskeletal: No gross deformities of extremities. Neurologic:  Normal speech and language.  Skin:  Skin is warm, dry and intact. No rash noted. ____________________________________________   LABS (all labs ordered are listed, but only abnormal results are displayed)  Labs Reviewed  CBC - Abnormal; Notable for the following components:      Result Value   WBC 19.1 (*)    RBC 5.45 (*)    MCV 79.4 (*)    MCH 25.0 (*)    RDW 16.4 (*)    Platelets 541 (*)    All other components within normal limits  BASIC METABOLIC PANEL - Abnormal; Notable for the following components:   Potassium 3.4 (*)    Glucose, Bld 153 (*)    Creatinine, Ser 1.14 (*)    Calcium 8.8 (*)    GFR calc non Af Amer 57 (*)    All other components within normal limits  HEPATIC FUNCTION PANEL - Abnormal; Notable for the following components:   Albumin 3.3 (*)    All other components within normal limits  LIPASE, BLOOD  HCG, SERUM, QUALITATIVE   ____________________________________________  EKG   EKG Interpretation  Date/Time:  Monday October 27 2019 22:04:25 EDT Ventricular Rate:  89 PR Interval:    QRS Duration: 92 QT Interval:  400 QTC Calculation: 487 R Axis:   14 Text Interpretation: Sinus rhythm Low voltage, precordial leads Borderline prolonged QT interval No STEMI Confirmed by Nanda Quinton [phone]) on 10/27/2019 10:06:06 PM       ____________________________________________  RADIOLOGY  CT ABDOMEN PELVIS W CONTRAST  Result Date: 10/27/2019 CLINICAL DATA:  Abdominal distension.  Nausea and vomiting. EXAM: CT ABDOMEN AND PELVIS WITH CONTRAST TECHNIQUE: Multidetector CT imaging of the abdomen and pelvis was performed using the standard protocol following bolus administration of  intravenous contrast. CONTRAST:  128mL OMNIPAQUE IOHEXOL 300 MG/ML  SOLN COMPARISON:  None. FINDINGS: Lower chest: The lung bases are clear. The heart size is normal. Hepatobiliary: There is decreased hepatic attenuation suggestive of hepatic steatosis. Gallbladder is distended without definite CT evidence for acute cholecystitis.There is no biliary ductal dilation. Pancreas: Normal contours without ductal dilatation. No peripancreatic fluid collection. Spleen: Unremarkable. Adrenals/Urinary Tract: --Adrenal glands: Unremarkable. --Right kidney/ureter: No hydronephrosis or radiopaque kidney stones. --Left kidney/ureter: No hydronephrosis or radiopaque kidney stones. --Urinary bladder: Unremarkable. Stomach/Bowel: --Stomach/Duodenum: There is diffuse wall thickening throughout the duodenum. --Small bowel: There is no evidence for small bowel obstruction. Are scattered areas of circumferential wall thickening involving the small-bowel intermixed with normal appearing areas. --Colon: The colon is relatively decompressed which limits evaluation --Appendix: Normal. Vascular/Lymphatic: Atherosclerotic calcification is present within the non-aneurysmal abdominal aorta, without hemodynamically significant stenosis. --No retroperitoneal lymphadenopathy. --No mesenteric lymphadenopathy. --No pelvic or inguinal lymphadenopathy. Reproductive: Unremarkable Other:  There is a small amount of free fluid in the patient's abdomen and pelvis. There is edema of the mesentery. Musculoskeletal. No acute displaced fractures. IMPRESSION: 1. Overall findings suspicious for duodenitis with infectious or inflammatory enteritis. There is no evidence for small bowel obstruction. 2. There is a small volume of free fluid in the abdomen and pelvis, presumably reactive. 3. Hepatic steatosis. 4. Relative underdistention of the colon which limits evaluation. There may be some areas of wall thickening of the colon, especially at the level of the  sigmoid colon which may indicate an underlying colitis. Electronically Signed   By: Constance Holster M.D.   On: 10/27/2019 23:39    ____________________________________________   PROCEDURES  Procedure(s) performed:   Procedures  None ____________________________________________   INITIAL IMPRESSION / ASSESSMENT AND PLAN / ED COURSE  Pertinent labs & imaging results that were available during my care of the patient were reviewed by me and considered in my medical decision making (see chart for details).   Patient presents to the emergency department with acute onset nausea, vomiting, diarrhea with diffuse abdominal discomfort.  She was hypotensive and diaphoretic when EMS arrived.  She has diffuse, mild tenderness on exam but nothing focal.  Initial blood work showing leukocytosis of 19.   Labs reviewed. Moved forward with CT abdomen/pelvis with leukocytosis and intractable pain. Care transferred to Dr. Dina Rich pending CT.  ____________________________________________  FINAL CLINICAL IMPRESSION(S) / ED DIAGNOSES  Final diagnoses:  Nausea vomiting and diarrhea  Gastroenteritis     MEDICATIONS GIVEN DURING THIS VISIT:  Medications  sodium chloride 0.9 % bolus 1,000 mL (0 mLs Intravenous Stopped 10/28/19 0032)  ondansetron (ZOFRAN) injection 4 mg (4 mg Intravenous Given 10/27/19 2224)  dicyclomine (BENTYL) injection 20 mg (20 mg Intramuscular Given 10/27/19 2223)  morphine 4 MG/ML injection 4 mg (4 mg Intravenous Given 10/27/19 2256)  iohexol (OMNIPAQUE) 300 MG/ML solution 100 mL (100 mLs Intravenous Contrast Given 10/27/19 2312)     NEW OUTPATIENT MEDICATIONS STARTED DURING THIS VISIT:  Discharge Medication List as of 10/28/2019 12:59 AM    START taking these medications   Details  ondansetron (ZOFRAN ODT) 4 MG disintegrating tablet Take 1 tablet (4 mg total) by mouth every 8 (eight) hours as needed for nausea or vomiting., Starting Tue 10/28/2019, Print        Note:  This  document was prepared using Dragon voice recognition software and may include unintentional dictation errors.  Nanda Quinton, MD, [facility] Emergency Medicine    Tjay Velazquez, [facility], MD 10/28/19 1350

## 2019-10-27 NOTE — ED Triage Notes (Signed)
Patient brought in by EMS for nausea, vomiting, diarrhea. Patient thinks that she may have food poisoning from a salad that she ate from [place]. EMS reports patient vomited 5 times. EMS reports hypotension. Current BP 108/58.

## 2019-10-28 MED ORDER — ONDANSETRON 4 MG PO TBDP
4.0000 mg | ORAL_TABLET | Freq: Three times a day (TID) | ORAL | 0 refills | Status: DC | PRN
Start: 2019-10-28 — End: 2021-07-21

## 2019-10-28 NOTE — ED Provider Notes (Signed)
Patient signed out pending CT scan.  In brief, patient presents with nausea, vomiting, abdominal pain.  Fairly acute in onset.  Noted to be dehydrated.  White count of 19.  Nonspecific abdominal exam.  CT scan shows evidence of inflammation of the intestine consistent with duodenitis versus enteritis.  I suspect viral versus foodborne.  On recheck, patient reports that her pain is much better controlled and she is requesting something to drink.  Patient able to tolerate fluids.  We discussed supportive measures including staying hydrated at home.  Zofran as needed for nausea.  After history, exam, and medical workup I feel the patient has been appropriately medically screened and is safe for discharge home. Pertinent diagnoses were discussed with the patient. Patient was given return precautions.    Merryl Hacker, MD 10/28/19 [phone]

## 2019-10-28 NOTE — Discharge Instructions (Addendum)
You were seen today with concerns for nausea, vomiting, diarrhea.  You have some inflammation in your bowel consistent with enteritis or gastroenteritis.  This is likely viral or foodborne in nature.  Take Zofran as needed for nausea.  Make sure that you are staying hydrated.

## 2019-10-29 ENCOUNTER — Encounter (INDEPENDENT_AMBULATORY_CARE_PROVIDER_SITE_OTHER): Payer: Self-pay | Admitting: Bariatrics

## 2019-10-29 NOTE — Telephone Encounter (Signed)
Please advise 

## 2019-11-05 ENCOUNTER — Other Ambulatory Visit: Payer: Self-pay | Admitting: Family Medicine

## 2019-11-05 DIAGNOSIS — Z1231 Encounter for screening mammogram for malignant neoplasm of breast: Secondary | ICD-10-CM

## 2019-11-13 ENCOUNTER — Other Ambulatory Visit: Payer: Self-pay

## 2019-11-13 ENCOUNTER — Ambulatory Visit (INDEPENDENT_AMBULATORY_CARE_PROVIDER_SITE_OTHER): Payer: Commercial Managed Care - PPO | Admitting: Bariatrics

## 2019-11-13 ENCOUNTER — Encounter (INDEPENDENT_AMBULATORY_CARE_PROVIDER_SITE_OTHER): Payer: Self-pay | Admitting: Bariatrics

## 2019-11-13 VITALS — BP 152/86 | HR 86 | Temp 98.0°F | Ht 62.0 in | Wt 331.0 lb

## 2019-11-13 DIAGNOSIS — Z9189 Other specified personal risk factors, not elsewhere classified: Secondary | ICD-10-CM

## 2019-11-13 DIAGNOSIS — R7303 Prediabetes: Secondary | ICD-10-CM

## 2019-11-13 DIAGNOSIS — E559 Vitamin D deficiency, unspecified: Secondary | ICD-10-CM

## 2019-11-13 DIAGNOSIS — I10 Essential (primary) hypertension: Secondary | ICD-10-CM | POA: Diagnosis not present

## 2019-11-13 DIAGNOSIS — Z6841 Body Mass Index (BMI) 40.0 and over, adult: Secondary | ICD-10-CM

## 2019-11-13 MED ORDER — CHLORTHALIDONE 25 MG PO TABS: 25.0000 mg | ORAL_TABLET | Freq: Every day | ORAL | 0 refills | Status: DC

## 2019-11-13 MED ORDER — VITAMIN D (ERGOCALCIFEROL) 1.25 MG (50000 UNIT) PO CAPS: 50000.0000 [IU] | ORAL_CAPSULE | ORAL | 0 refills | Status: DC

## 2019-11-13 NOTE — Progress Notes (Signed)
Chief Complaint:   OBESITY Diamond Marshall is here to discuss her progress with her obesity treatment plan along with follow-up of her obesity related diagnoses. Diamond Marshall is on the Category 4 Plan and states she is following her eating plan approximately 10% of the time. Diamond Marshall states she is exercising 0 minutes 0 times per week.  Today's visit was #: 6 Starting weight: 336 lbs Starting date: 07/21/2019 Today's weight: 331 lbs  Today's date: 11/13/2019 Total lbs lost to date: 5 Total lbs lost since last in-office visit: 0  Interim History: Diamond Marshall is up 1 lb. She had to go to the hospital for food poisoning. She has been on the [employer].  Subjective:   Prediabetes. Diamond Marshall has a diagnosis of prediabetes based on her elevated HgA1c and was informed this puts her at greater risk of developing diabetes. She continues to work on diet and exercise to decrease her risk of diabetes. She denies nausea or hypoglycemia. No polyphagia.  Lab Results  Component Value Date   HGBA1C 6.1 (H) 10/23/2019   Lab Results  Component Value Date   INSULIN 22.5 10/23/2019   INSULIN 19.8 07/21/2019   Vitamin D deficiency. No nausea, vomiting, or muscle weakness.    Ref. Range 10/23/2019 14:46  Vitamin D, 25-Hydroxy Latest Ref Range: 30.0 - 100.0 ng/mL 29.2 (L)   Essential hypertension. Blood pressure elevated at 152/86 today.  BP Readings from Last 3 Encounters:  11/13/19 (!) 152/86  10/27/19 (!) 130/97  10/23/19 (!) 153/77   Lab Results  Component Value Date   CREATININE 1.14 (H) 10/27/2019   CREATININE 0.82 10/23/2019   CREATININE 0.70 04/03/2019   At risk for diabetes mellitus. Diamond Marshall is at higher than average risk for developing diabetes due to prediabetes.   Assessment/Plan:   Prediabetes. [name] will continue to work on weight loss, exercise, increasing healthy fats and protein, and decreasing simple carbohydrates to help decrease the risk of diabetes.   Vitamin D deficiency.  Low Vitamin D level contributes to fatigue and are associated with obesity, breast, and colon cancer. She was given a prescription for Vitamin D, Ergocalciferol, (DRISDOL) 1.25 MG (50000 UNIT) CAPS capsule every week #4 with 0 refills and will follow-up for routine testing of Vitamin D, at least 2-3 times per year to avoid over-replacement.   Essential hypertension. Diamond Marshall is working on healthy weight loss and exercise to improve blood pressure control. We will watch for signs of hypotension as she continues her lifestyle modifications. Prescription was given for chlorthalidone (HYGROTON) 25 MG tablet 1 PO daily #30 with 0 refills.  At risk for diabetes mellitus. Diamond Marshall was given approximately 15 minutes of diabetes education and counseling today. We discussed intensive lifestyle modifications today with an emphasis on weight loss as well as increasing exercise and decreasing simple carbohydrates in her diet. We also reviewed medication options with an emphasis on risk versus benefit of those discussed.   Repetitive spaced learning was employed today to elicit superior memory formation and behavioral change.  Class 3 severe obesity with serious comorbidity and body mass index (BMI) of 60.0 to 69.9 in adult, unspecified obesity type (Diamond Marshall).  Diamond Marshall is currently in the action stage of change. As such, her goal is to continue with weight loss efforts. She has agreed to the Category 4 Plan.   She will work on meal planning and intentional eating.   Exercise goals: All adults should avoid inactivity. Some physical activity is better than none, and adults who  participate in any amount of physical activity gain some health benefits.  Behavioral modification strategies: increasing lean protein intake, decreasing simple carbohydrates, increasing vegetables, increasing water intake, decreasing eating out, no skipping meals, meal planning and cooking strategies, keeping healthy foods in the home and planning  for success.  Diamond Marshall has agreed to follow-up with our clinic in 4 weeks. She was informed of the importance of frequent follow-up visits to maximize her success with intensive lifestyle modifications for her multiple health conditions.   Objective:   Blood pressure (!) 152/86, pulse 86, temperature 98 F (36.7 C), height 5\' 2"  (1.575 m), weight (!) 331 lb (150.1 kg), SpO2 96 %. Body mass index is 60.54 kg/m.  General: Cooperative, alert, well developed, in no acute distress. HEENT: Conjunctivae and lids unremarkable. Cardiovascular: Regular rhythm.  Lungs: Normal work of breathing. Neurologic: No focal deficits.   Lab Results  Component Value Date   CREATININE 1.14 (H) 10/27/2019   BUN 19 10/27/2019   NA 137 10/27/2019   K 3.4 (L) 10/27/2019   CL 101 10/27/2019   CO2 24 10/27/2019   Lab Results  Component Value Date   ALT 23 10/27/2019   AST 17 10/27/2019   ALKPHOS 84 10/27/2019   BILITOT 0.5 10/27/2019   Lab Results  Component Value Date   HGBA1C 6.1 (H) 10/23/2019   HGBA1C 5.7 (H) 07/21/2019   HGBA1C 5.6 01/02/2018   Lab Results  Component Value Date   INSULIN 22.5 10/23/2019   INSULIN 19.8 07/21/2019   Lab Results  Component Value Date   TSH 3.650 04/03/2019   Lab Results  Component Value Date   CHOL 149 07/21/2019   HDL 44 07/21/2019   LDLCALC 80 07/21/2019   TRIG 143 07/21/2019   Lab Results  Component Value Date   WBC 19.1 (H) 10/27/2019   HGB 13.6 10/27/2019   HCT 43.3 10/27/2019   MCV 79.4 (L) 10/27/2019   PLT 541 (H) 10/27/2019   Lab Results  Component Value Date   IRON 31 06/17/2018   TIBC 305 06/17/2018   FERRITIN 53 06/17/2018   Attestation Statements:   Reviewed by clinician on day of visit: allergies, medications, problem list, medical history, surgical history, family history, social history, and previous encounter notes.  Migdalia Dk, am acting as Location manager for [employer], DO   I have reviewed the above  documentation for accuracy and completeness, and I agree with the above. [name], DO

## 2019-12-04 ENCOUNTER — Other Ambulatory Visit: Payer: Self-pay | Admitting: Family Medicine

## 2019-12-04 DIAGNOSIS — Z1231 Encounter for screening mammogram for malignant neoplasm of breast: Secondary | ICD-10-CM

## 2019-12-11 ENCOUNTER — Ambulatory Visit (INDEPENDENT_AMBULATORY_CARE_PROVIDER_SITE_OTHER): Payer: Commercial Managed Care - PPO | Admitting: Bariatrics

## 2019-12-15 ENCOUNTER — Telehealth: Payer: Self-pay | Admitting: Family Medicine

## 2019-12-15 NOTE — Telephone Encounter (Signed)
Patient last seen by Dr. Livia Snellen in January.  Appointment scheduled for 01/02/20 for referral.

## 2019-12-15 NOTE — Telephone Encounter (Signed)
  REFERRAL REQUEST Telephone Note 12/15/2019  What type of referral do you need? Pt wants referral to colonoscopy.  Why do you need this referral? Her friend just found out she has cancer Have you been seen at our office for this problem? no (Advise that they may need an appointment with their PCP before a referral can be done)  Is there a particular doctor or location that you prefer? no  Patient notified that referrals can take up to a week or longer to process. If they haven't heard anything within a week they should call back and speak with the referral department.

## 2020-01-02 ENCOUNTER — Ambulatory Visit: Payer: Commercial Managed Care - PPO | Admitting: Family Medicine

## 2020-01-14 ENCOUNTER — Inpatient Hospital Stay: Admission: RE | Admit: 2020-01-14 | Payer: Commercial Managed Care - PPO | Source: Ambulatory Visit

## 2020-03-16 ENCOUNTER — Other Ambulatory Visit: Payer: Self-pay | Admitting: Family Medicine

## 2020-03-16 DIAGNOSIS — F32A Depression, unspecified: Secondary | ICD-10-CM

## 2020-03-22 ENCOUNTER — Telehealth: Payer: Self-pay

## 2020-03-24 ENCOUNTER — Encounter: Payer: Self-pay | Admitting: Family Medicine

## 2020-03-24 ENCOUNTER — Other Ambulatory Visit: Payer: Self-pay

## 2020-03-24 ENCOUNTER — Ambulatory Visit (INDEPENDENT_AMBULATORY_CARE_PROVIDER_SITE_OTHER): Payer: Commercial Managed Care - PPO | Admitting: Family Medicine

## 2020-03-24 VITALS — BP 142/88 | HR 99 | Temp 96.8°F | Ht 62.0 in | Wt 335.2 lb

## 2020-03-24 DIAGNOSIS — M545 Low back pain, unspecified: Secondary | ICD-10-CM | POA: Diagnosis not present

## 2020-03-24 DIAGNOSIS — R519 Headache, unspecified: Secondary | ICD-10-CM

## 2020-03-24 DIAGNOSIS — R030 Elevated blood-pressure reading, without diagnosis of hypertension: Secondary | ICD-10-CM | POA: Diagnosis not present

## 2020-03-24 MED ORDER — PREDNISONE 20 MG PO TABS: ORAL_TABLET | ORAL | 0 refills | Status: DC

## 2020-03-24 MED ORDER — METHYLPREDNISOLONE ACETATE 80 MG/ML IJ SUSP
80.0000 mg | Freq: Once | INTRAMUSCULAR | Status: AC
  Administered 2020-03-24: 80 mg via INTRAMUSCULAR

## 2020-03-24 MED ORDER — CYCLOBENZAPRINE HCL 10 MG PO TABS: 10.0000 mg | ORAL_TABLET | Freq: Three times a day (TID) | ORAL | 1 refills | Status: DC | PRN

## 2020-03-24 NOTE — Progress Notes (Signed)
Subjective: KG:YJEH pain, headache PCP: Claretta Fraise, MD  Diamond Marshall is a 49 y.o. female presenting to clinic today for:  1. Back pain Diamond Marshall reports pain in her lower right back for 2 days. She woke with the pain yesterday. Pain is 5-8/10. Pain is sharp and it comes and goes. She is feeling muscle spasms. She took Advil PM last night and this helped with her pain. She takes voltaren BID. Marland Kitchen She has tried ice and heat with some relief. The pain is worse when sitting up and when rising from a seated position. She felt a pop in her right leg last night but she denies pain, numbness, or tingling in right leg or foot since. Denies changes in bowel or bladder control. She had sciatica during a pregnancy, and reports this is different. She denies warmth or fever. Denies dysuria, abdominal pain, nausea, or vomiting. Denies injury.   2. Headache Diamond Marshall reports a headaches starting last Friday that lasted until Sunday that turned into a migraine. She was also getting flushed during the weekend. She had been out of her Lexapro for a week. She started taking her lexapro again on Monday and reports improvement. She does not have a headache today. She was concerned about her blood pressure and wanted to have this checked. She denies blurred vision, chest pain, or shortness of breath.   Relevant past medical, surgical, family, and social history reviewed and updated as indicated.  Allergies and medications reviewed and updated.  Allergies  Allergen Reactions  . Phentermine     Became suicidal on the medication  . Topiramate Other (See Comments)    dizziness  . Septra [Sulfamethoxazole-Trimethoprim] Rash   Past Medical History:  Diagnosis Date  . Anemia    b12 deficiency  . Anxiety   . B12 deficiency   . Back pain   . Depression   . Gallbladder problem   . GERD (gastroesophageal reflux disease)   . Hypothyroidism   . Joint pain   . Knee pain   . Palpitation   . Pernicious anemia     . SOB (shortness of breath)   . Thyroid disease    hypothyroidism  . Vitamin D deficiency     Current Outpatient Medications:  .  BLISOVI FE 1.5/30 1.5-30 MG-MCG tablet, Take 1 tablet by mouth daily., Disp: , Rfl:  .  chlorthalidone (HYGROTON) 25 MG tablet, Take 1 tablet (25 mg total) by mouth daily., Disp: 30 tablet, Rfl: 0 .  cyanocobalamin (,VITAMIN B-12,) 1000 MCG/ML injection, INJECT 1 ML INTRAMUSCULARLY EVERY 15 DAYS (Patient taking differently: Inject 1,000 mcg into the skin every 14 (fourteen) days. INJECT 1 ML INTRAMUSCULARLY EVERY 15 DAYS), Disp: 6 mL, Rfl: 0 .  diclofenac (VOLTAREN) 75 MG EC tablet, Take 1 tablet (75 mg total) by mouth 2 (two) times daily., Disp: 180 tablet, Rfl: 1 .  escitalopram (LEXAPRO) 20 MG tablet, Take 1 tablet (20 mg total) by mouth daily. (Needs to be seen before next refill), Disp: 30 tablet, Rfl: 0 .  levothyroxine (SYNTHROID) 50 MCG tablet, TAKE 1 TABLET DAILY (Patient taking differently: Take 50 mcg by mouth daily before breakfast. ), Disp: 90 tablet, Rfl: 3 .  LORazepam (ATIVAN) 0.5 MG tablet, Take 1 tablet (0.5 mg total) by mouth 2 (two) times daily as needed for anxiety., Disp: 30 tablet, Rfl: 0 .  ondansetron (ZOFRAN ODT) 4 MG disintegrating tablet, Take 1 tablet (4 mg total) by mouth every 8 (eight) hours as needed for nausea  or vomiting., Disp: 20 tablet, Rfl: 0 .  pantoprazole (PROTONIX) 40 MG tablet, Take 1 tablet (40 mg total) by mouth daily. For stomach, Disp: 30 tablet, Rfl: 11 .  Vitamin D, Ergocalciferol, (DRISDOL) 1.25 MG (50000 UNIT) CAPS capsule, Take 1 capsule (50,000 Units total) by mouth every 7 (seven) days., Disp: 4 capsule, Rfl: 0 Social History   Socioeconomic History  . Marital status: Married    Spouse name: Ronalee Belts  . Number of children: Not on file  . Years of education: Not on file  . Highest education level: Not on file  Occupational History  . Occupation: Adult Water engineer  Tobacco Use  . Smoking status: Never  Smoker  . Smokeless tobacco: Never Used  Vaping Use  . Vaping Use: Never used  Substance and Sexual Activity  . Alcohol use: No  . Drug use: No  . Sexual activity: Yes    Birth control/protection: Pill  Other Topics Concern  . Not on file  Social History Narrative  . Not on file   Social Determinants of Health   Financial Resource Strain:   . Difficulty of Paying Living Expenses: Not on file  Food Insecurity:   . Worried About Charity fundraiser in the Last Year: Not on file  . Ran Out of Food in the Last Year: Not on file  Transportation Needs:   . Lack of Transportation (Medical): Not on file  . Lack of Transportation (Non-Medical): Not on file  Physical Activity:   . Days of Exercise per Week: Not on file  . Minutes of Exercise per Session: Not on file  Stress:   . Feeling of Stress : Not on file  Social Connections:   . Frequency of Communication with Friends and Family: Not on file  . Frequency of Social Gatherings with Friends and Family: Not on file  . Attends Religious Services: Not on file  . Active Member of Clubs or Organizations: Not on file  . Attends Archivist Meetings: Not on file  . Marital Status: Not on file  Intimate Partner Violence:   . Fear of Current or Ex-Partner: Not on file  . Emotionally Abused: Not on file  . Physically Abused: Not on file  . Sexually Abused: Not on file   Family History  Problem Relation Age of Onset  . Hypertension Mother   . Diabetes Mother   . High Cholesterol Mother   . Thyroid disease Mother   . Depression Mother   . Anxiety disorder Mother   . Obesity Mother   . CVA Father        hemorrhagic    Review of Systems  Per HPI.  Objective: Office vital signs reviewed. BP (!) 142/88   Pulse 99   Temp (!) 96.8 F (36 C)   Ht 5\' 2"  (1.575 m)   Wt (!) 335 lb 3.2 oz (152 kg)   SpO2 98%   BMI 61.31 kg/m   Physical Examination:  Physical Exam Vitals and nursing note reviewed.  Constitutional:        Appearance: She is not ill-appearing, toxic-appearing or diaphoretic.  HENT:     Head: Normocephalic and atraumatic.  Cardiovascular:     Rate and Rhythm: Normal rate and regular rhythm.     Heart sounds: Normal heart sounds. No murmur heard.   Pulmonary:     Effort: Pulmonary effort is normal. No respiratory distress.     Breath sounds: Normal breath sounds.  Musculoskeletal:  Cervical back: Normal.     Thoracic back: Normal.     Lumbar back: No swelling, edema, deformity or bony tenderness. Normal range of motion. Negative right straight leg raise test.     Right lower leg: No edema.     Left lower leg: No edema.     Comments: Diffuse muscular tenderness along right lower back.   Skin:    General: Skin is warm and dry.  Neurological:     Mental Status: She is alert and oriented to person, place, and time.     Gait: Gait normal.  Psychiatric:        Mood and Affect: Mood normal.        Behavior: Behavior normal.      Results for orders placed or performed during the hospital encounter of 10/27/19  CBC  Result Value Ref Range   WBC 19.1 (H) 4.0 - 10.5 K/uL   RBC 5.45 (H) 3.87 - 5.11 MIL/uL   Hemoglobin 13.6 12.0 - 15.0 g/dL   HCT 43.3 36 - 46 %   MCV 79.4 (L) 80.0 - 100.0 fL   MCH 25.0 (L) 26.0 - 34.0 pg   MCHC 31.4 30.0 - 36.0 g/dL   RDW 16.4 (H) 11.5 - 15.5 %   Platelets 541 (H) 150 - 400 K/uL   nRBC 0.0 0.0 - 0.2 %  Basic metabolic panel  Result Value Ref Range   Sodium 137 135 - 145 mmol/L   Potassium 3.4 (L) 3.5 - 5.1 mmol/L   Chloride 101 98 - 111 mmol/L   CO2 24 22 - 32 mmol/L   Glucose, Bld 153 (H) 70 - 99 mg/dL   BUN 19 6 - 20 mg/dL   Creatinine, Ser 1.14 (H) 0.44 - 1.00 mg/dL   Calcium 8.8 (L) 8.9 - 10.3 mg/dL   GFR calc non Af Amer 57 (L) >60 mL/min   GFR calc Af Amer >60 >60 mL/min   Anion gap 12 5 - 15  Lipase, blood  Result Value Ref Range   Lipase 23 11 - 51 U/L  Hepatic function panel  Result Value Ref Range   Total Protein 7.4 6.5 -  8.1 g/dL   Albumin 3.3 (L) 3.5 - 5.0 g/dL   AST 17 15 - 41 U/L   ALT 23 0 - 44 U/L   Alkaline Phosphatase 84 38 - 126 U/L   Total Bilirubin 0.5 0.3 - 1.2 mg/dL   Bilirubin, Direct 0.1 0.0 - 0.2 mg/dL   Indirect Bilirubin 0.4 0.3 - 0.9 mg/dL  hCG, serum, qualitative  Result Value Ref Range   Preg, Serum NEGATIVE NEGATIVE     Assessment/ Plan: Diamond Marshall was seen today for headache and back pain.  Diagnoses and all orders for this visit:  Acute right-sided low back pain without sciatica Discussed conservative therapy for now. Flexeril for muscle spasms. Continue voltaren, heat, rest. Can take Tylenol as needed. Steroid IM injection today with prednisone burst starting tomorrow. If no improvement, discussed may need PT.  -     cyclobenzaprine (FLEXERIL) 10 MG tablet; Take 1 tablet (10 mg total) by mouth 3 (three) times daily as needed for muscle spasms. -     predniSONE (DELTASONE) 20 MG tablet; 2 po at sametime daily for 5 days- start tomorrow -     methylPREDNISolone acetate (DEPO-MEDROL) injection 80 mg  Acute nonintractable headache, unspecified headache type Likely due to abrupt cessation of Lexapro. Headache has resolved since restarting Lexapro.  Elevated BP without  diagnosis of hypertension BP 142/88 today, however pain is currently in pain. She does have access to a BP monitor at home. BP log given with instruction to take BP daily when pain improves and notify PCP of persistent readings >130/80s.  Return to office for new or worsening symptoms, or if symptoms persist.   The above assessment and management plan was discussed with the patient. The patient verbalized understanding of and has agreed to the management plan. Patient is aware to call the clinic if symptoms persist or worsen. Patient is aware when to return to the clinic for a follow-up visit. Patient educated on when it is appropriate to go to the emergency department.   Marjorie Smolder, FNP-C [facility] [address] [place],  [address] [phone]

## 2020-03-24 NOTE — Patient Instructions (Signed)
Acute Back Pain, Adult Acute back pain is sudden and usually short-lived. It is often caused by an injury to the muscles and tissues in the back. The injury may result from:  A muscle or ligament getting overstretched or torn (strained). Ligaments are tissues that connect bones to each other. Lifting something improperly can cause a back strain.  Wear and tear (degeneration) of the spinal disks. Spinal disks are circular tissue that provides cushioning between the bones of the spine (vertebrae).  Twisting motions, such as while playing sports or doing yard work.  A hit to the back.  Arthritis. You may have a physical exam, lab tests, and imaging tests to find the cause of your pain. Acute back pain usually goes away with rest and home care. Follow these instructions at home: Managing pain, stiffness, and swelling  Take over-the-counter and prescription medicines only as told by your health care provider.  Your health care provider may recommend applying ice during the first 24-48 hours after your pain starts. To do this: ? Put ice in a plastic bag. ? Place a towel between your skin and the bag. ? Leave the ice on for 20 minutes, 2-3 times a day.  If directed, apply heat to the affected area as often as told by your health care provider. Use the heat source that your health care provider recommends, such as a moist heat pack or a heating pad. ? Place a towel between your skin and the heat source. ? Leave the heat on for 20-30 minutes. ? Remove the heat if your skin turns bright red. This is especially important if you are unable to feel pain, heat, or cold. You have a greater risk of getting burned. Activity   Do not stay in bed. Staying in bed for more than 1-2 days can delay your recovery.  Sit up and stand up straight. Avoid leaning forward when you sit, or hunching over when you stand. ? If you work at a desk, sit close to it so you do not need to lean over. Keep your chin tucked  in. Keep your neck drawn back, and keep your elbows bent at a right angle. Your arms should look like the letter "L." ? Sit high and close to the steering wheel when you drive. Add lower back (lumbar) support to your car seat, if needed.  Take short walks on even surfaces as soon as you are able. Try to increase the length of time you walk each day.  Do not sit, drive, or stand in one place for more than 30 minutes at a time. Sitting or standing for long periods of time can put stress on your back.  Do not drive or use heavy machinery while taking prescription pain medicine.  Use proper lifting techniques. When you bend and lift, use positions that put less stress on your back: ? Bend your knees. ? Keep the load close to your body. ? Avoid twisting.  Exercise regularly as told by your health care provider. Exercising helps your back heal faster and helps prevent back injuries by keeping muscles strong and flexible.  Work with a physical therapist to make a safe exercise program, as recommended by your health care provider. Do any exercises as told by your physical therapist. Lifestyle  Maintain a healthy weight. Extra weight puts stress on your back and makes it difficult to have good posture.  Avoid activities or situations that make you feel anxious or stressed. Stress and anxiety increase muscle   tension and can make back pain worse. Learn ways to manage anxiety and stress, such as through exercise. General instructions  Sleep on a firm mattress in a comfortable position. Try lying on your side with your knees slightly bent. If you lie on your back, put a pillow under your knees.  Follow your treatment plan as told by your health care provider. This may include: ? Cognitive or behavioral therapy. ? Acupuncture or massage therapy. ? Meditation or yoga. Contact a health care provider if:  You have pain that is not relieved with rest or medicine.  You have increasing pain going down  into your legs or buttocks.  Your pain does not improve after 2 weeks.  You have pain at night.  You lose weight without trying.  You have a fever or chills. Get help right away if:  You develop new bowel or bladder control problems.  You have unusual weakness or numbness in your arms or legs.  You develop nausea or vomiting.  You develop abdominal pain.  You feel faint. Summary  Acute back pain is sudden and usually short-lived.  Use proper lifting techniques. When you bend and lift, use positions that put less stress on your back.  Take over-the-counter and prescription medicines and apply heat or ice as directed by your health care provider. This information is not intended to replace advice given to you by your health care provider. Make sure you discuss any questions you have with your health care provider. Document Revised: 08/27/2018 Document Reviewed: 12/20/2016 Elsevier Patient Education  2020 Elsevier Inc.  

## 2020-04-22 ENCOUNTER — Other Ambulatory Visit: Payer: Self-pay | Admitting: Family Medicine

## 2020-04-22 DIAGNOSIS — F32A Depression, unspecified: Secondary | ICD-10-CM

## 2020-04-22 NOTE — Telephone Encounter (Signed)
Stacks NTBS 30 days given 03/16/20

## 2020-04-24 ENCOUNTER — Other Ambulatory Visit: Payer: Self-pay | Admitting: Family Medicine

## 2020-04-24 DIAGNOSIS — F32A Depression, unspecified: Secondary | ICD-10-CM

## 2020-04-24 DIAGNOSIS — F419 Anxiety disorder, unspecified: Secondary | ICD-10-CM

## 2020-05-06 ENCOUNTER — Other Ambulatory Visit: Payer: Self-pay | Admitting: Family Medicine

## 2020-05-18 ENCOUNTER — Encounter: Payer: Self-pay | Admitting: Family Medicine

## 2020-05-18 ENCOUNTER — Ambulatory Visit (INDEPENDENT_AMBULATORY_CARE_PROVIDER_SITE_OTHER): Payer: Commercial Managed Care - PPO | Admitting: Family Medicine

## 2020-05-18 ENCOUNTER — Other Ambulatory Visit: Payer: Self-pay

## 2020-05-18 VITALS — BP 134/89 | HR 97 | Temp 96.6°F | Resp 20 | Ht 62.0 in | Wt 335.0 lb

## 2020-05-18 DIAGNOSIS — E8881 Metabolic syndrome: Secondary | ICD-10-CM | POA: Diagnosis not present

## 2020-05-18 DIAGNOSIS — F32A Depression, unspecified: Secondary | ICD-10-CM

## 2020-05-18 DIAGNOSIS — Z1321 Encounter for screening for nutritional disorder: Secondary | ICD-10-CM

## 2020-05-18 DIAGNOSIS — F419 Anxiety disorder, unspecified: Secondary | ICD-10-CM

## 2020-05-18 DIAGNOSIS — E038 Other specified hypothyroidism: Secondary | ICD-10-CM | POA: Diagnosis not present

## 2020-05-18 DIAGNOSIS — R7303 Prediabetes: Secondary | ICD-10-CM

## 2020-05-18 LAB — BAYER DCA HB A1C WAIVED: HB A1C (BAYER DCA - WAIVED): 6.2 % (ref <7.0)

## 2020-05-18 MED ORDER — LORAZEPAM 0.5 MG PO TABS: 0.5000 mg | ORAL_TABLET | Freq: Two times a day (BID) | ORAL | 5 refills | Status: DC | PRN

## 2020-05-18 MED ORDER — DICLOFENAC SODIUM 75 MG PO TBEC: 75.0000 mg | DELAYED_RELEASE_TABLET | Freq: Two times a day (BID) | ORAL | 1 refills | Status: DC

## 2020-05-18 MED ORDER — PANTOPRAZOLE SODIUM 40 MG PO TBEC: 40.0000 mg | DELAYED_RELEASE_TABLET | Freq: Every day | ORAL | 5 refills | Status: DC

## 2020-05-18 MED ORDER — BLISOVI FE 1.5/30 1.5-30 MG-MCG PO TABS: 1.0000 | ORAL_TABLET | Freq: Every day | ORAL | 11 refills | Status: DC

## 2020-05-18 MED ORDER — ESCITALOPRAM OXALATE 20 MG PO TABS: 20.0000 mg | ORAL_TABLET | Freq: Every day | ORAL | 5 refills | Status: DC

## 2020-05-18 MED ORDER — LEVOTHYROXINE SODIUM 50 MCG PO TABS: 50.0000 ug | ORAL_TABLET | Freq: Every day | ORAL | 3 refills | Status: DC

## 2020-05-18 NOTE — Progress Notes (Signed)
Subjective:  Patient ID: Diamond Marshall, female    DOB: [birth year]  Age: 49 y.o. MRN: [id number]  CC: No chief complaint on file.   HPI Diamond Marshall presents for  follow-up on  thyroid. The patient has a history of hypothyroidism for many years. It has been stable recently. Pt. denies any change in  voice, loss of hair, heat or cold intolerance. Energy level has been adequate to good. Patient denies constipation and diarrhea. No myxedema. Medication is as noted below. Verified that pt is taking it daily on an empty stomach. Well tolerated. She does get worried and anxious at times and take lorazepam. Now taking 1 LME daily max. PDMP sedative score is 010 and overdose risk is 110/1000  P. Has tried [employer] for weight loss and been unsuccessful interested in any help that can be offered.     Depression screen [facility] 2/9 05/18/2020 03/24/2020 09/02/2019  Decreased Interest 0 0 0  Down, Depressed, Hopeless 0 0 0  PHQ - 2 Score 0 0 0  Altered sleeping - - -  Tired, decreased energy - - -  Change in appetite - - -  Feeling bad or failure about yourself  - - -  Trouble concentrating - - -  Moving slowly or fidgety/restless - - -  Suicidal thoughts - - -  PHQ-9 Score - - -  Difficult doing work/chores - - -  Some recent data might be hidden    History Diamond Marshall has a past medical history of Anemia, Anxiety, B12 deficiency, Back pain, Depression, Gallbladder problem, GERD (gastroesophageal reflux disease), Hypothyroidism, Joint pain, Knee pain, Palpitation, Pernicious anemia, SOB (shortness of breath), Thyroid disease, and Vitamin D deficiency.   She has a past surgical history that includes Cesarean section (1999) and Dilatation and currettagement.   Her family history includes Anxiety disorder in her mother; CVA in her father; Depression in her mother; Diabetes in her mother; High Cholesterol in her mother; Hypertension in her mother; Obesity in her mother; Thyroid disease in her  mother.She reports that she has never smoked. She has never used smokeless tobacco. She reports that she does not drink alcohol and does not use drugs.    ROS Review of Systems  Constitutional: Negative.   HENT: Negative.   Eyes: Negative for visual disturbance.  Respiratory: Negative for shortness of breath.   Cardiovascular: Negative for chest pain.  Gastrointestinal: Negative for abdominal pain.  Musculoskeletal: Negative for arthralgias.    Objective:  BP 134/89   Pulse 97   Temp (!) 96.6 F (35.9 C) (Temporal)   Resp 20   Ht 5' 2"  (1.575 m)   Wt (!) 335 lb (152 kg)   SpO2 97%   BMI 61.27 kg/m   BP Readings from Last 3 Encounters:  05/18/20 134/89  03/24/20 (!) 142/88  11/13/19 (!) 152/86    Wt Readings from Last 3 Encounters:  05/18/20 (!) 335 lb (152 kg)  03/24/20 (!) 335 lb 3.2 oz (152 kg)  11/13/19 (!) 331 lb (150.1 kg)     Physical Exam Constitutional:      General: She is not in acute distress.    Appearance: She is well-developed. She is obese.  HENT:     Head: Normocephalic and atraumatic.  Eyes:     Conjunctiva/sclera: Conjunctivae normal.     Pupils: Pupils are equal, round, and reactive to light.  Neck:     Thyroid: No thyromegaly.  Cardiovascular:     Rate and  Rhythm: Normal rate and regular rhythm.     Heart sounds: Normal heart sounds. No murmur heard.   Pulmonary:     Effort: Pulmonary effort is normal. No respiratory distress.     Breath sounds: Normal breath sounds. No wheezing or rales.  Abdominal:     General: Bowel sounds are normal. There is no distension.     Palpations: Abdomen is soft.     Tenderness: There is no abdominal tenderness.  Musculoskeletal:        General: Normal range of motion.     Cervical back: Normal range of motion and neck supple.  Lymphadenopathy:     Cervical: No cervical adenopathy.  Skin:    General: Skin is warm and dry.  Neurological:     Mental Status: She is alert and oriented to person,  place, and time.  Psychiatric:        Behavior: Behavior normal.        Thought Content: Thought content normal.        Judgment: Judgment normal.       Assessment & Plan:   Diagnoses and all orders for this visit:  Other specified hypothyroidism -     CBC with Differential/Platelet -     Bayer DCA Hb A1c Waived -     CMP14+EGFR -     TSH + free T4  Anxiety and depression -     Discontinue: escitalopram (LEXAPRO) 20 MG tablet; Take 1 tablet (20 mg total) by mouth daily. -     Discontinue: LORazepam (ATIVAN) 0.5 MG tablet; Take 1 tablet (0.5 mg total) by mouth 2 (two) times daily as needed for anxiety. -     escitalopram (LEXAPRO) 20 MG tablet; Take 1 tablet (20 mg total) by mouth daily. -     LORazepam (ATIVAN) 0.5 MG tablet; Take 1 tablet (0.5 mg total) by mouth 2 (two) times daily as needed for anxiety.  Morbid obesity ([place]) -     CBC with Differential/Platelet -     Bayer DCA Hb A1c Waived -     CMP14+EGFR -     TSH + free T4 -     Dulaglutide (TRULICITY) 8.46 KZ/9.9JT SOPN; Inject 0.75 mg into the skin once a week.  Metabolic syndrome -     CBC with Differential/Platelet -     Bayer DCA Hb A1c Waived -     CMP14+EGFR -     TSH + free T4 -     Dulaglutide (TRULICITY) 7.01 XB/9.3JQ SOPN; Inject 0.75 mg into the skin once a week.  Encounter for vitamin deficiency screening -     VITAMIN D 25 Hydroxy (Vit-D Deficiency, Fractures)  Prediabetes -     Dulaglutide (TRULICITY) 3.00 PQ/3.3AQ SOPN; Inject 0.75 mg into the skin once a week.  Other orders -     Discontinue: pantoprazole (PROTONIX) 40 MG tablet; Take 1 tablet (40 mg total) by mouth daily. -     Discontinue: diclofenac (VOLTAREN) 75 MG EC tablet; Take 1 tablet (75 mg total) by mouth 2 (two) times daily. -     BLISOVI FE 1.5/30 1.5-30 MG-MCG tablet; Take 1 tablet by mouth daily. -     levothyroxine (SYNTHROID) 50 MCG tablet; Take 1 tablet (50 mcg total) by mouth daily. -     diclofenac (VOLTAREN) 75 MG EC  tablet; Take 1 tablet (75 mg total) by mouth 2 (two) times daily. -     pantoprazole (PROTONIX) 40 MG tablet; Take 1  tablet (40 mg total) by mouth daily.       I have discontinued Diamond Marshall's predniSONE. I have also changed her levothyroxine. Additionally, I am having her start on Trulicity. Lastly, I am having her maintain her cyanocobalamin, ondansetron, Vitamin D (Ergocalciferol), cyclobenzaprine, Blisovi Fe 1.5/30, escitalopram, diclofenac, pantoprazole, and LORazepam.  Allergies as of 05/18/2020      Reactions   Phentermine    Became suicidal on the medication   Topiramate Other (See Comments)   dizziness   Septra [sulfamethoxazole-trimethoprim] Rash      Medication List       Accurate as of May 18, 2020 11:59 PM. If you have any questions, ask your nurse or doctor.        STOP taking these medications   predniSONE 20 MG tablet Commonly known as: DELTASONE Stopped by: Claretta Fraise, MD     TAKE these medications   Blisovi Fe 1.5/30 1.5-30 MG-MCG tablet Generic drug: norethindrone-ethinyl estradiol-iron Take 1 tablet by mouth daily.   cyanocobalamin 1000 MCG/ML injection Commonly known as: (VITAMIN B-12) INJECT 1 ML INTRAMUSCULARLY EVERY 15 DAYS What changed:   how much to take  how to take this  when to take this   cyclobenzaprine 10 MG tablet Commonly known as: FLEXERIL Take 1 tablet (10 mg total) by mouth 3 (three) times daily as needed for muscle spasms.   diclofenac 75 MG EC tablet Commonly known as: VOLTAREN Take 1 tablet (75 mg total) by mouth 2 (two) times daily.   escitalopram 20 MG tablet Commonly known as: LEXAPRO Take 1 tablet (20 mg total) by mouth daily.   levothyroxine 50 MCG tablet Commonly known as: SYNTHROID Take 1 tablet (50 mcg total) by mouth daily. What changed: when to take this   LORazepam 0.5 MG tablet Commonly known as: ATIVAN Take 1 tablet (0.5 mg total) by mouth 2 (two) times daily as needed for anxiety.    ondansetron 4 MG disintegrating tablet Commonly known as: Zofran ODT Take 1 tablet (4 mg total) by mouth every 8 (eight) hours as needed for nausea or vomiting.   pantoprazole 40 MG tablet Commonly known as: PROTONIX Take 1 tablet (40 mg total) by mouth daily.   Trulicity 3.56 YS/1.6OH Sopn Generic drug: Dulaglutide Inject 0.75 mg into the skin once a week. Started by: Claretta Fraise, MD   Vitamin D (Ergocalciferol) 1.25 MG (50000 UNIT) Caps capsule Commonly known as: DRISDOL Take 1 capsule (50,000 Units total) by mouth every 7 (seven) days.        Follow-up: Return in about 3 months (around 08/16/2020).  Claretta Fraise, M.D.

## 2020-05-19 ENCOUNTER — Encounter: Payer: Self-pay | Admitting: Family Medicine

## 2020-05-19 LAB — CBC WITH DIFFERENTIAL/PLATELET
Basophils Absolute: 0.1 10*3/uL (ref 0.0–0.2)
Basos: 1 %
EOS (ABSOLUTE): 0.1 10*3/uL (ref 0.0–0.4)
Eos: 1 %
Hematocrit: 37.4 % (ref 34.0–46.6)
Hemoglobin: 11.7 g/dL (ref 11.1–15.9)
Immature Grans (Abs): 0.1 10*3/uL (ref 0.0–0.1)
Immature Granulocytes: 1 %
Lymphocytes Absolute: 3 10*3/uL (ref 0.7–3.1)
Lymphs: 26 %
MCH: 24 pg — ABNORMAL LOW (ref 26.6–33.0)
MCHC: 31.3 g/dL — ABNORMAL LOW (ref 31.5–35.7)
MCV: 77 fL — ABNORMAL LOW (ref 79–97)
Monocytes Absolute: 0.6 10*3/uL (ref 0.1–0.9)
Monocytes: 5 %
Neutrophils Absolute: 7.5 10*3/uL — ABNORMAL HIGH (ref 1.4–7.0)
Neutrophils: 66 %
Platelets: 469 10*3/uL — ABNORMAL HIGH (ref 150–450)
RBC: 4.88 x10E6/uL (ref 3.77–5.28)
RDW: 15.2 % (ref 11.7–15.4)
WBC: 11.3 10*3/uL — ABNORMAL HIGH (ref 3.4–10.8)

## 2020-05-19 LAB — CMP14+EGFR
ALT: 16 IU/L (ref 0–32)
AST: 15 IU/L (ref 0–40)
Albumin/Globulin Ratio: 1.3 (ref 1.2–2.2)
Albumin: 3.9 g/dL (ref 3.8–4.8)
Alkaline Phosphatase: 110 IU/L (ref 44–121)
BUN/Creatinine Ratio: 17 (ref 9–23)
BUN: 14 mg/dL (ref 6–24)
Bilirubin Total: <0.2 mg/dL (ref 0.0–1.2)
CO2: 22 mmol/L (ref 20–29)
Calcium: 9 mg/dL (ref 8.7–10.2)
Chloride: 102 mmol/L (ref 96–106)
Creatinine, Ser: 0.83 mg/dL (ref 0.57–1.00)
GFR calc Af Amer: 96 mL/min/{1.73_m2} (ref >59)
GFR calc non Af Amer: 83 mL/min/{1.73_m2} (ref >59)
Globulin, Total: 3.1 g/dL (ref 1.5–4.5)
Glucose: 128 mg/dL — ABNORMAL HIGH (ref 65–99)
Potassium: 4.7 mmol/L (ref 3.5–5.2)
Sodium: 139 mmol/L (ref 134–144)
Total Protein: 7 g/dL (ref 6.0–8.5)

## 2020-05-19 LAB — TSH+FREE T4
Free T4: 1.07 ng/dL (ref 0.82–1.77)
TSH: 2.43 u[IU]/mL (ref 0.450–4.500)

## 2020-05-19 LAB — VITAMIN D 25 HYDROXY (VIT D DEFICIENCY, FRACTURES): Vit D, 25-Hydroxy: 17.6 ng/mL — ABNORMAL LOW (ref 30.0–100.0)

## 2020-05-19 MED ORDER — LORAZEPAM 0.5 MG PO TABS
0.5000 mg | ORAL_TABLET | Freq: Two times a day (BID) | ORAL | 5 refills | Status: DC | PRN
Start: 2020-05-19 — End: 2022-07-04

## 2020-05-19 MED ORDER — TRULICITY 0.75 MG/0.5ML ~~LOC~~ SOAJ: 0.7500 mg | SUBCUTANEOUS | 5 refills | Status: DC

## 2020-05-24 ENCOUNTER — Telehealth: Payer: Self-pay | Admitting: *Deleted

## 2020-05-24 ENCOUNTER — Encounter: Payer: Self-pay | Admitting: Family Medicine

## 2020-05-24 LAB — IRON AND TIBC
Iron Saturation: 7 % — CL (ref 15–55)
Iron: 27 ug/dL (ref 27–159)
Total Iron Binding Capacity: 395 ug/dL (ref 250–450)
UIBC: 368 ug/dL (ref 131–425)

## 2020-05-24 LAB — SPECIMEN STATUS REPORT

## 2020-05-24 NOTE — Telephone Encounter (Signed)
PA in process for Key: [id number] - PA Case ID: [id number] Trulicity

## 2020-05-25 ENCOUNTER — Encounter: Payer: Self-pay | Admitting: Family Medicine

## 2020-05-25 ENCOUNTER — Other Ambulatory Visit: Payer: Self-pay | Admitting: Family Medicine

## 2020-05-25 ENCOUNTER — Telehealth: Payer: Self-pay | Admitting: *Deleted

## 2020-05-25 MED ORDER — OZEMPIC (0.25 OR 0.5 MG/DOSE) 2 MG/1.5ML ~~LOC~~ SOPN
0.2500 mg | PEN_INJECTOR | SUBCUTANEOUS | 2 refills | Status: DC
Start: 2020-05-25 — End: 2020-10-12

## 2020-05-25 NOTE — Telephone Encounter (Signed)
I submitted ozempic to take its place

## 2020-05-25 NOTE — Telephone Encounter (Signed)
Pt aware of provider feedback and voiced understanding. 

## 2020-05-25 NOTE — Telephone Encounter (Signed)
Ozempic 0.25 - 0.5 - PA came in and started Key: [id number] SENT TO PLAN

## 2020-05-25 NOTE — Telephone Encounter (Signed)
Denied on January 3 Request Reference Number: [id number]. TRULICITY INJ 0.75/0.5 is denied for not meeting the prior authorization requirement(s). Details of this decision are in the notice attached below or have been faxed to you. Appeals are not supported through ePA. Please refer to the fax case notice for appeals information and instructions  DENIED You do not meet the clinical requirements for this medication. REASON: This decision is based on your plan's drug coverage policy for this medication. Please see the DECISION NOTES AND DETAILS section for more information.

## 2020-05-26 NOTE — Telephone Encounter (Signed)
Ovie Elsayed (Key: [id number]) Ozempic (0.25 or 0.5 MG/DOSE) 2MG /1.5ML pen-injectors  re-submitted with additional info  Waiting for 2nd response

## 2020-05-26 NOTE — Telephone Encounter (Signed)
OZEMPIC INJ 2/1.5ML is denied for not meeting the prior authorization requirement(s)   Plan comment:  The Federal Drug Administration (FDA) has not approved this medication for your condition.  (Trulicity has already been denied)

## 2020-05-26 NOTE — Telephone Encounter (Signed)
Please appeal on the basis of morbid obesity and diabetes

## 2020-05-26 NOTE — Telephone Encounter (Signed)
OZEMPIC INJ 2/1.5ML is approved through 05/26/2021.  Your patient may now fill this prescription and it will be covered. [facility] aware === cost for pt is $250 per month.    Will Reach out to [place] to see about coupon card for this med.

## 2020-05-27 NOTE — Telephone Encounter (Signed)
Please let patient know:  Ozempic Copay cards are available online OR patient can text the word "BEGIN" to [phone] for ozempic copay card  This copay card will only cover up to $150 per fill.  It appears that patient has high deductible plan?  She can call insurance to see why copay is so high.   We do not currently have samples

## 2020-05-27 NOTE — Telephone Encounter (Signed)
Pt aware and go to the Ozempic website to print off the coupon.

## 2020-06-19 ENCOUNTER — Other Ambulatory Visit: Payer: Self-pay | Admitting: Family Medicine

## 2020-06-29 ENCOUNTER — Ambulatory Visit: Payer: Commercial Managed Care - PPO | Admitting: Family Medicine

## 2020-07-19 ENCOUNTER — Encounter: Payer: Self-pay | Admitting: Family Medicine

## 2020-07-30 ENCOUNTER — Encounter: Payer: Self-pay | Admitting: Family Medicine

## 2020-07-30 ENCOUNTER — Other Ambulatory Visit: Payer: Self-pay

## 2020-07-30 ENCOUNTER — Ambulatory Visit (INDEPENDENT_AMBULATORY_CARE_PROVIDER_SITE_OTHER): Payer: Commercial Managed Care - PPO | Admitting: Family Medicine

## 2020-07-30 VITALS — BP 148/91 | HR 95 | Temp 96.6°F | Ht 62.0 in | Wt 329.0 lb

## 2020-07-30 DIAGNOSIS — M549 Dorsalgia, unspecified: Secondary | ICD-10-CM

## 2020-07-30 LAB — MICROSCOPIC EXAMINATION: RBC, Urine: NONE SEEN /hpf (ref 0–2)

## 2020-07-30 LAB — URINALYSIS, COMPLETE
Bilirubin, UA: NEGATIVE
Glucose, UA: NEGATIVE
Ketones, UA: NEGATIVE
Nitrite, UA: NEGATIVE
Protein,UA: NEGATIVE
RBC, UA: NEGATIVE
Specific Gravity, UA: 1.02 (ref 1.005–1.030)
Urobilinogen, Ur: 0.2 mg/dL (ref 0.2–1.0)
pH, UA: 7 (ref 5.0–7.5)

## 2020-07-30 MED ORDER — CYCLOBENZAPRINE HCL 10 MG PO TABS: 10.0000 mg | ORAL_TABLET | Freq: Three times a day (TID) | ORAL | 1 refills | Status: DC | PRN

## 2020-07-30 NOTE — Progress Notes (Signed)
Acute Office Visit  Subjective:    Patient ID: Diamond Marshall, female    DOB: [birth year], 50 y.o.   MRN: [id number]  Chief Complaint  Patient presents with  . Flank Pain    HPI Patient is in today for back pain on her left lower and mid back area for about 1 week. The pain is intermittent. The pain is a clenching pain and is a 4-5/10. It does not radiate. She has taken tylenol and heat with a little relief. Denies dysuria. She reports urgency and improvement in flank pain after urinating. Denies fever or chills. She feels tired. She denies injury or fall, numbness or tingling, or saddle anesthesia. She has tried drinking more fluid and cranberry juice. She denies abdominal pain, nausea, vomiting, or blood in urine. She has had UTIs in the past but this feels different. She does have chronic back pain but this is not typically where it hurt. She has flexeril at home but has not tried this for her pain. 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   Past Medical History:  Diagnosis Date  . Anemia    b12 deficiency  . Anxiety   . B12 deficiency   . Back pain   . Depression   . Gallbladder problem   . GERD (gastroesophageal reflux disease)   . Hypothyroidism   . Joint pain   . Knee pain   . Palpitation   . Pernicious anemia   . SOB (shortness of breath)   . Thyroid disease    hypothyroidism  . Vitamin D deficiency     Past Surgical History:  Procedure Laterality Date  . CESAREAN SECTION  1999  . Dilatation and currettagement     for miscarriage 18 years ago    Family History  Problem Relation Age of Onset  . Hypertension Mother   . Diabetes Mother   . High Cholesterol Mother   . Thyroid disease Mother   . Depression Mother   . Anxiety disorder Mother   . Obesity Mother   . CVA Father        hemorrhagic    Social History   Socioeconomic History  . Marital status:  Married    Spouse name: Ronalee Belts  . Number of children: Not on file  . Years of education: Not on file  . Highest education level: Not on file  Occupational History  . Occupation: Adult Water engineer  Tobacco Use  . Smoking status: Never Smoker  . Smokeless tobacco: Never Used  Vaping Use  . Vaping Use: Never used  Substance and Sexual Activity  . Alcohol use: No  . Drug use: No  . Sexual activity: Yes    Birth control/protection: Pill  Other Topics Concern  . Not on file  Social History Narrative  . Not on file   Social Determinants of Health   Financial Resource Strain: Not on file  Food Insecurity: Not on file  Transportation Needs: Not on file  Physical Activity: Not on file  Stress: Not on file  Social Connections: Not on file  Intimate Partner Violence: Not on file    Outpatient Medications Prior to Visit  Medication Sig Dispense Refill  . BLISOVI FE 1.5/30 1.5-30 MG-MCG tablet Take 1 tablet by mouth daily. 28 tablet 11  . cyanocobalamin (,VITAMIN B-12,) 1000 MCG/ML injection INJECT 1 ML INTRAMUSCULARLY EVERY  15 DAYS 6 mL 0  . cyclobenzaprine (FLEXERIL) 10 MG tablet Take 1 tablet (10 mg total) by mouth 3 (three) times daily as needed for muscle spasms. 30 tablet 1  . diclofenac (VOLTAREN) 75 MG EC tablet Take 1 tablet (75 mg total) by mouth 2 (two) times daily. 180 tablet 1  . escitalopram (LEXAPRO) 20 MG tablet Take 1 tablet (20 mg total) by mouth daily. 30 tablet 5  . levothyroxine (SYNTHROID) 50 MCG tablet Take 1 tablet (50 mcg total) by mouth daily. 90 tablet 3  . LORazepam (ATIVAN) 0.5 MG tablet Take 1 tablet (0.5 mg total) by mouth 2 (two) times daily as needed for anxiety. 30 tablet 5  . ondansetron (ZOFRAN ODT) 4 MG disintegrating tablet Take 1 tablet (4 mg total) by mouth every 8 (eight) hours as needed for nausea or vomiting. 20 tablet 0  . pantoprazole (PROTONIX) 40 MG tablet Take 1 tablet (40 mg total) by mouth daily. 30 tablet 5  . Semaglutide,0.25 or  0.5MG /DOS, (OZEMPIC, 0.25 OR 0.5 MG/DOSE,) 2 MG/1.5ML SOPN Inject 0.25 mg into the skin once a week. 1.5 mL 2  . Vitamin D, Ergocalciferol, (DRISDOL) 1.25 MG (50000 UNIT) CAPS capsule Take 1 capsule (50,000 Units total) by mouth every 7 (seven) days. 4 capsule 0   No facility-administered medications prior to visit.    Allergies  Allergen Reactions  . Phentermine     Became suicidal on the medication  . Topiramate Other (See Comments)    dizziness  . Septra [Sulfamethoxazole-Trimethoprim] Rash    Review of Systems As per HPI.     Objective:    Physical Exam Vitals and nursing note reviewed.  Constitutional:      General: She is not in acute distress.    Appearance: She is not ill-appearing, toxic-appearing or diaphoretic.  Cardiovascular:     Rate and Rhythm: Normal rate and regular rhythm.     Heart sounds: Normal heart sounds. No murmur heard.   Pulmonary:     Effort: Pulmonary effort is normal. No respiratory distress.     Breath sounds: Normal breath sounds.  Abdominal:     General: Bowel sounds are normal. There is no distension.     Palpations: Abdomen is soft.     Tenderness: There is no abdominal tenderness. There is no guarding or rebound.  Musculoskeletal:     Right lower leg: No edema.     Left lower leg: No edema.     Comments: Muscular tenderness to left lower and mid back. No edema, swelling, erythema, or bony tenderness. Full ROM.   Skin:    General: Skin is warm and dry.  Neurological:     General: No focal deficit present.     Mental Status: She is alert and oriented to person, place, and time.     Motor: No weakness.     Gait: Gait normal.  Psychiatric:        Mood and Affect: Mood normal.        Behavior: Behavior normal.        Thought Content: Thought content normal.        Judgment: Judgment normal.     BP (!) 148/91   Pulse 95   Temp (!) 96.6 F (35.9 C) (Temporal)   Ht 5\' 2"  (1.575 m)   Wt (!) 329 lb (149.2 kg)   BMI 60.17 kg/m   Wt Readings from Last 3 Encounters:  07/30/20 (!) 329 lb (149.2 kg)  05/18/20 Marland Kitchen)  335 lb (152 kg)  03/24/20 (!) 335 lb 3.2 oz (152 kg)   Urine dipstick shows positive for leukocytes.  Micro exam: 0-5 WBC's per HPF, 0 RBC's per HPF and few bacteria.  Health Maintenance Due  Topic Date Due  . Hepatitis C Screening  Never done  . HIV Screening  Never done  . MAMMOGRAM  11/05/2014  . COLONOSCOPY (Pts 45-46yrs Insurance coverage will need to be confirmed)  Never done  . PAP SMEAR-Modifier  02/01/2018  . COVID-19 Vaccine (3 - Moderna risk 4-dose series) 08/26/2019    There are no preventive care reminders to display for this patient.   Lab Results  Component Value Date   TSH 2.430 05/18/2020   Lab Results  Component Value Date   WBC 11.3 (H) 05/18/2020   HGB 11.7 05/18/2020   HCT 37.4 05/18/2020   MCV 77 (L) 05/18/2020   PLT 469 (H) 05/18/2020   Lab Results  Component Value Date   NA 139 05/18/2020   K 4.7 05/18/2020   CO2 22 05/18/2020   GLUCOSE 128 (H) 05/18/2020   BUN 14 05/18/2020   CREATININE 0.83 05/18/2020   BILITOT <0.2 05/18/2020   ALKPHOS 110 05/18/2020   AST 15 05/18/2020   ALT 16 05/18/2020   PROT 7.0 05/18/2020   ALBUMIN 3.9 05/18/2020   CALCIUM 9.0 05/18/2020   ANIONGAP 12 10/27/2019   Lab Results  Component Value Date   CHOL 149 07/21/2019   Lab Results  Component Value Date   HDL 44 07/21/2019   Lab Results  Component Value Date   LDLCALC 80 07/21/2019   Lab Results  Component Value Date   TRIG 143 07/21/2019   No results found for: [facility] Lab Results  Component Value Date   HGBA1C 6.2 05/18/2020       Assessment & Plan:   Matricia was seen today for flank pain.  Diagnoses and all orders for this visit:  Acute left-sided back pain, unspecified back location UA does not indicate infection. Culture pending. Try flexeril for spasms, tylenol, and voltaren for pain. Return to office for new or worsening symptoms, or if symptoms  persist.  -     Urinalysis, Complete -     Urine Culture -     cyclobenzaprine (FLEXERIL) 10 MG tablet; Take 1 tablet (10 mg total) by mouth 3 (three) times daily as needed for muscle spasms.   The patient indicates understanding of these issues and agrees with the plan.   Gwenlyn Perking, FNP

## 2020-07-30 NOTE — Patient Instructions (Signed)
Lumbar Strain A lumbar strain, which is sometimes called a low-back strain, is a stretch or tear in a muscle or the strong cords of tissue that attach muscle to bone (tendons) in the lower back (lumbar spine). This type of injury occurs when muscles or tendons are torn or are stretched beyond their limits. Lumbar strains can range from mild to severe. Mild strains may involve stretching a muscle or tendon without tearing it. These may heal in 1-2 weeks. More severe strains involve tearing of muscle fibers or tendons. These will cause more pain and may take 6-8 weeks to heal. What are the causes? This condition may be caused by:  Trauma, such as a fall or a hit to the body.  Twisting or overstretching the back. This may result from doing activities that need a lot of energy, such as lifting heavy objects. What increases the risk? This injury is more common in:  Athletes.  People with obesity.  People who do repeated lifting, bending, or other movements that involve their back. What are the signs or symptoms? Symptoms of this condition may include:  Sharp or dull pain in the lower back that does not go away. The pain may extend to the buttocks.  Stiffness or limited range of motion.  Sudden muscle tightening (spasms). How is this diagnosed? This condition may be diagnosed based on:  Your symptoms.  Your medical history.  A physical exam.  Imaging tests, such as: ? X-rays. ? MRI. How is this treated? Treatment for this condition may include:  Rest.  Applying heat and cold to the affected area.  Over-the-counter medicines to help relieve pain and inflammation, such as NSAIDs.  Prescription pain medicine and muscle relaxants may be needed for a short time.  Physical therapy. Follow these instructions at home: Managing pain, stiffness, and swelling  If directed, put ice on the injured area during the first 24 hours after your injury. ? Put ice in a plastic bag. ? Place  a towel between your skin and the bag. ? Leave the ice on for 20 minutes, 2-3 times a day.  If directed, apply heat to the affected area as often as told by your health care provider. Use the heat source that your health care provider recommends, such as a moist heat pack or a heating pad. ? Place a towel between your skin and the heat source. ? Leave the heat on for 20-30 minutes. ? Remove the heat if your skin turns bright red. This is especially important if you are unable to feel pain, heat, or cold. You may have a greater risk of getting burned.      Activity  Rest and return to your normal activities as told by your health care provider. Ask your health care provider what activities are safe for you.  Do exercises as told by your health care provider. Medicines  Take over-the-counter and prescription medicines only as told by your health care provider.  Ask your health care provider if the medicine prescribed to you: ? Requires you to avoid driving or using heavy machinery. ? Can cause constipation. You may need to take these actions to prevent or treat constipation:  Drink enough fluid to keep your urine pale yellow.  Take over-the-counter or prescription medicines.  Eat foods that are high in fiber, such as beans, whole grains, and fresh fruits and vegetables.  Limit foods that are high in fat and processed sugars, such as fried or sweet foods. Injury prevention To   prevent a future low-back injury:  Always warm up properly before physical activity or sports.  Cool down and stretch after being active.  Use correct form when playing sports and lifting heavy objects. Bend your knees before you lift heavy objects.  Use good posture when sitting and standing.  Stay physically fit and keep a healthy weight. ? Do at least 150 minutes of moderate-intensity exercise each week, such as brisk walking or water aerobics. ? Do strength exercises at least 2 times each week.    General instructions  Do not use any products that contain nicotine or tobacco, such as cigarettes, e-cigarettes, and chewing tobacco. If you need help quitting, ask your health care provider.  Keep all follow-up visits as told by your health care provider. This is important. Contact a health care provider if:  Your back pain does not improve after 6 weeks of treatment.  Your symptoms get worse. Get help right away if:  Your back pain is severe.  You are unable to stand or walk.  You develop pain in your legs.  You develop weakness in your buttocks or legs.  You have difficulty controlling when you urinate or when you have a bowel movement. ? You have frequent, painful, or bloody urination. ? You have a temperature over 101.0F (38.3C) Summary  A lumbar strain, which is sometimes called a low-back strain, is a stretch or tear in a muscle or the strong cords of tissue that attach muscle to bone (tendons) in the lower back (lumbar spine).  This type of injury occurs when muscles or tendons are torn or are stretched beyond their limits.  Rest and return to your normal activities as told by your health care provider. If directed, apply heat and ice to the affected area as often as told by your health care provider.  Take over-the-counter and prescription medicines only as told by your health care provider.  Contact a health care provider if you have new or worsening symptoms. This information is not intended to replace advice given to you by your health care provider. Make sure you discuss any questions you have with your health care provider. Document Revised: 03/07/2018 Document Reviewed: 03/07/2018 Elsevier Patient Education  2021 [employer] Inc.  

## 2020-08-02 LAB — URINE CULTURE

## 2020-08-10 ENCOUNTER — Other Ambulatory Visit: Payer: Self-pay

## 2020-08-10 ENCOUNTER — Encounter: Payer: Self-pay | Admitting: Family Medicine

## 2020-08-10 ENCOUNTER — Ambulatory Visit (INDEPENDENT_AMBULATORY_CARE_PROVIDER_SITE_OTHER): Payer: Commercial Managed Care - PPO | Admitting: Family Medicine

## 2020-08-10 VITALS — BP 138/93 | HR 97 | Temp 97.3°F | Ht 62.0 in | Wt 326.2 lb

## 2020-08-10 DIAGNOSIS — M25561 Pain in right knee: Secondary | ICD-10-CM

## 2020-08-10 DIAGNOSIS — E038 Other specified hypothyroidism: Secondary | ICD-10-CM

## 2020-08-10 DIAGNOSIS — R7303 Prediabetes: Secondary | ICD-10-CM | POA: Diagnosis not present

## 2020-08-10 NOTE — Progress Notes (Signed)
Subjective:  Patient ID: Diamond Marshall, female    DOB: [birth year]  Age: 50 y.o. MRN: [id number]  CC: No chief complaint on file.   HPI Diamond Marshall presents for right knee pain and obesity. She has been using Ozempic for weight loss.  She has been at the point to 5 dose.  That seems to be suppressing her appetite.  She does not feel hungry most of the time.  She is able to go couple of hours after smelling something that is tasty and put aside without eating.  Her portion sizes are smaller.  She says that she is gaining 3 pounds from being on her menses this week.  However, she feels she is doing quite well with the medicine without any side effects.  She notes that early on she overate with pizza and wings and had some diarrhea.  That was a limited incident that has not recurred in the several weeks since it happened.  Patient wants to start an exercise program but she has too much pain in the right knee.  Her knee is too big for her to wear any type of brace that we could find for her.  She would like to have a cortisone shot to relieve the discomfort so she can begin exercising to improve her weight loss program.  Depression screen [facility] 2/9 08/10/2020 05/18/2020 03/24/2020  Decreased Interest 0 0 0  Down, Depressed, Hopeless 0 0 0  PHQ - 2 Score 0 0 0  Altered sleeping - - -  Tired, decreased energy - - -  Change in appetite - - -  Feeling bad or failure about yourself  - - -  Trouble concentrating - - -  Moving slowly or fidgety/restless - - -  Suicidal thoughts - - -  PHQ-9 Score - - -  Difficult doing work/chores - - -  Some recent data might be hidden    History Diamond Marshall has a past medical history of Anemia, Anxiety, B12 deficiency, Back pain, Depression, Gallbladder problem, GERD (gastroesophageal reflux disease), Hypothyroidism, Joint pain, Knee pain, Palpitation, Pernicious anemia, SOB (shortness of breath), Thyroid disease, and Vitamin D deficiency.   She has a past surgical  history that includes Cesarean section (1999) and Dilatation and currettagement.   Her family history includes Anxiety disorder in her mother; CVA in her father; Depression in her mother; Diabetes in her mother; High Cholesterol in her mother; Hypertension in her mother; Obesity in her mother; Thyroid disease in her mother.She reports that she has never smoked. She has never used smokeless tobacco. She reports that she does not drink alcohol and does not use drugs.    ROS Review of Systems  Constitutional: Negative.   HENT: Negative.   Eyes: Negative for visual disturbance.  Respiratory: Negative for shortness of breath.   Cardiovascular: Negative for chest pain.  Gastrointestinal: Negative for abdominal pain.  Musculoskeletal: Negative for arthralgias.    Objective:  BP (!) 138/93   Pulse 97   Temp (!) 97.3 F (36.3 C)   Ht 5\' 2"  (1.575 m)   Wt (!) 326 lb 3.2 oz (148 kg)   SpO2 97%   BMI 59.66 kg/m   BP Readings from Last 3 Encounters:  08/10/20 (!) 138/93  07/30/20 (!) 148/91  05/18/20 134/89    Wt Readings from Last 3 Encounters:  08/10/20 (!) 326 lb 3.2 oz (148 kg)  07/30/20 (!) 329 lb (149.2 kg)  05/18/20 (!) 335 lb (152 kg)  Physical Exam Constitutional:      General: She is not in acute distress.    Appearance: She is well-developed.  Cardiovascular:     Rate and Rhythm: Normal rate and regular rhythm.  Pulmonary:     Breath sounds: Normal breath sounds.  Musculoskeletal:        General: Tenderness (with motion at the knees. R>L) present.  Skin:    General: Skin is warm and dry.  Neurological:     Mental Status: She is alert and oriented to person, place, and time.       Assessment & Plan:   Diagnoses and all orders for this visit:  Other specified hypothyroidism  Morbid obesity ([place])  Prediabetes    . After obtaining informed consent, A steroid injection was performed at the right knee. The medial compartment was cleansed with  betadine. Local with ethyl chloride spray.  Some Thank You with 1.5 inch, 23 gauge needle using 2.5 ml marcan and 9 mg of Celestone the joint space was entered and injected. This was well tolerated. Simple dressing applied.   I am having Diamond Marshall maintain her ondansetron, Vitamin D (Ergocalciferol), Blisovi Fe 1.5/30, levothyroxine, escitalopram, diclofenac, pantoprazole, LORazepam, Ozempic (0.25 or 0.5 MG/DOSE), cyanocobalamin, and cyclobenzaprine.  Allergies as of 08/10/2020      Reactions   Phentermine    Became suicidal on the medication   Topiramate Other (See Comments)   dizziness   Septra [sulfamethoxazole-trimethoprim] Rash      Medication List       Accurate as of August 10, 2020  3:19 PM. If you have any questions, ask your nurse or doctor.        Blisovi Fe 1.5/30 1.5-30 MG-MCG tablet Generic drug: norethindrone-ethinyl estradiol-iron Take 1 tablet by mouth daily.   cyanocobalamin 1000 MCG/ML injection Commonly known as: (VITAMIN B-12) INJECT 1 ML INTRAMUSCULARLY EVERY 15 DAYS   cyclobenzaprine 10 MG tablet Commonly known as: FLEXERIL Take 1 tablet (10 mg total) by mouth 3 (three) times daily as needed for muscle spasms.   diclofenac 75 MG EC tablet Commonly known as: VOLTAREN Take 1 tablet (75 mg total) by mouth 2 (two) times daily.   escitalopram 20 MG tablet Commonly known as: LEXAPRO Take 1 tablet (20 mg total) by mouth daily.   levothyroxine 50 MCG tablet Commonly known as: SYNTHROID Take 1 tablet (50 mcg total) by mouth daily.   LORazepam 0.5 MG tablet Commonly known as: ATIVAN Take 1 tablet (0.5 mg total) by mouth 2 (two) times daily as needed for anxiety.   ondansetron 4 MG disintegrating tablet Commonly known as: Zofran ODT Take 1 tablet (4 mg total) by mouth every 8 (eight) hours as needed for nausea or vomiting.   Ozempic (0.25 or 0.5 MG/DOSE) 2 MG/1.5ML Sopn Generic drug: Semaglutide(0.25 or 0.5MG /DOS) Inject 0.25 mg into the skin once  a week.   pantoprazole 40 MG tablet Commonly known as: PROTONIX Take 1 tablet (40 mg total) by mouth daily.   Vitamin D (Ergocalciferol) 1.25 MG (50000 UNIT) Caps capsule Commonly known as: DRISDOL Take 1 capsule (50,000 Units total) by mouth every 7 (seven) days.       Patient has multiple diagnoses listed above that would benefit tremendously from weight loss.  The exception that a course his thyroid.  However, adjusting it appropriately should help facilitate the weight loss.   Follow-up: Return in about 3 months (around 11/10/2020).  Claretta Fraise, M.D.

## 2020-09-27 ENCOUNTER — Other Ambulatory Visit: Payer: Self-pay | Admitting: Family Medicine

## 2020-10-12 ENCOUNTER — Ambulatory Visit (INDEPENDENT_AMBULATORY_CARE_PROVIDER_SITE_OTHER): Payer: Commercial Managed Care - PPO | Admitting: Family Medicine

## 2020-10-12 ENCOUNTER — Encounter: Payer: Self-pay | Admitting: Family Medicine

## 2020-10-12 ENCOUNTER — Other Ambulatory Visit: Payer: Self-pay

## 2020-10-12 VITALS — BP 133/92 | HR 85 | Temp 96.0°F | Ht 62.0 in | Wt 324.0 lb

## 2020-10-12 DIAGNOSIS — K439 Ventral hernia without obstruction or gangrene: Secondary | ICD-10-CM

## 2020-10-12 MED ORDER — OZEMPIC (1 MG/DOSE) 4 MG/3ML ~~LOC~~ SOPN: 1.0000 mg | PEN_INJECTOR | SUBCUTANEOUS | 5 refills | Status: DC

## 2020-10-12 NOTE — Progress Notes (Signed)
Subjective:  Patient ID: Diamond Marshall, female    DOB: April 11, 1971  Age: 50 y.o. MRN: [id number]  CC: Abdominal Pain   HPI Diamond Marshall presents for bulging in the midline of her upper abdomen.   Depression screen [facility] 2/9 10/12/2020 08/10/2020 08/10/2020  Decreased Interest 0 0 0  Down, Depressed, Hopeless 0 0 0  PHQ - 2 Score 0 0 0  Altered sleeping - 1 -  Tired, decreased energy - 1 -  Change in appetite - 0 -  Feeling bad or failure about yourself  - 0 -  Trouble concentrating - 0 -  Moving slowly or fidgety/restless - 0 -  Suicidal thoughts - 0 -  PHQ-9 Score - 2 -  Difficult doing work/chores - Not difficult at all -  Some recent data might be hidden    History Diamond Marshall has a past medical history of Anemia, Anxiety, B12 deficiency, Back pain, Depression, Gallbladder problem, GERD (gastroesophageal reflux disease), Hypothyroidism, Joint pain, Knee pain, Palpitation, Pernicious anemia, SOB (shortness of breath), Thyroid disease, and Vitamin D deficiency.   She has a past surgical history that includes Cesarean section (1999) and Dilatation and currettagement.   Her family history includes Anxiety disorder in her mother; CVA in her father; Depression in her mother; Diabetes in her mother; High Cholesterol in her mother; Hypertension in her mother; Obesity in her mother; Thyroid disease in her mother.She reports that she has never smoked. She has never used smokeless tobacco. She reports that she does not drink alcohol and does not use drugs.    ROS Review of Systems  Constitutional: Negative.   HENT: Negative.   Respiratory: Negative for shortness of breath.   Cardiovascular: Negative for chest pain.  Gastrointestinal: Positive for abdominal distention. Negative for abdominal pain.  Musculoskeletal: Negative for arthralgias.    Objective:  BP (!) 133/92   Pulse 85   Temp (!) 96 F (35.6 C)   Ht 5\' 2"  (1.575 m)   Wt (!) 324 lb (147 kg)   SpO2 96%   BMI 59.26 kg/m    BP Readings from Last 3 Encounters:  10/12/20 (!) 133/92  08/10/20 (!) 138/93  07/30/20 (!) 148/91    Wt Readings from Last 3 Encounters:  10/12/20 (!) 324 lb (147 kg)  08/10/20 (!) 326 lb 3.2 oz (148 kg)  07/30/20 (!) 329 lb (149.2 kg)     Physical Exam Abdominal:     Tenderness: There is guarding. There is no rebound.     Hernia: A hernia is present. Hernia is present in the ventral area (measures 4X 8 cm approx.).       Assessment & Plan:   Diamond Marshall was seen today for abdominal pain.  Diagnoses and all orders for this visit:  Ventral hernia without obstruction or gangrene  Other orders -     Semaglutide, 1 MG/DOSE, (OZEMPIC, 1 MG/DOSE,) 4 MG/3ML SOPN; Inject 1 mg into the skin once a week.       I have discontinued Diamond Marshall's Ozempic (0.25 or 0.5 MG/DOSE). I am also having her start on Ozempic (1 MG/DOSE). Additionally, I am having her maintain her ondansetron, Vitamin D (Ergocalciferol), levothyroxine, escitalopram, diclofenac, pantoprazole, LORazepam, cyanocobalamin, cyclobenzaprine, and Blisovi Fe 1.5/30.  Allergies as of 10/12/2020      Reactions   Phentermine    Became suicidal on the medication   Topiramate Other (See Comments)   dizziness   Septra [sulfamethoxazole-trimethoprim] Rash      Medication  List       Accurate as of Oct 12, 2020 11:59 PM. If you have any questions, ask your nurse or doctor.        STOP taking these medications   Ozempic (0.25 or 0.5 MG/DOSE) 2 MG/1.5ML Sopn Generic drug: Semaglutide(0.25 or 0.5MG /DOS) Replaced by: Ozempic (1 MG/DOSE) 4 MG/3ML Sopn Stopped by: Claretta Fraise, MD     TAKE these medications   Blisovi Fe 1.5/30 1.5-30 MG-MCG tablet Generic drug: norethindrone-ethinyl estradiol-iron TAKE 1 TABLET DAILY   cyanocobalamin 1000 MCG/ML injection Commonly known as: (VITAMIN B-12) INJECT 1 ML INTRAMUSCULARLY EVERY 15 DAYS   cyclobenzaprine 10 MG tablet Commonly known as: FLEXERIL Take 1 tablet  (10 mg total) by mouth 3 (three) times daily as needed for muscle spasms.   diclofenac 75 MG EC tablet Commonly known as: VOLTAREN Take 1 tablet (75 mg total) by mouth 2 (two) times daily.   escitalopram 20 MG tablet Commonly known as: LEXAPRO Take 1 tablet (20 mg total) by mouth daily.   levothyroxine 50 MCG tablet Commonly known as: SYNTHROID Take 1 tablet (50 mcg total) by mouth daily.   LORazepam 0.5 MG tablet Commonly known as: ATIVAN Take 1 tablet (0.5 mg total) by mouth 2 (two) times daily as needed for anxiety.   ondansetron 4 MG disintegrating tablet Commonly known as: Zofran ODT Take 1 tablet (4 mg total) by mouth every 8 (eight) hours as needed for nausea or vomiting.   Ozempic (1 MG/DOSE) 4 MG/3ML Sopn Generic drug: Semaglutide (1 MG/DOSE) Inject 1 mg into the skin once a week. Replaces: Ozempic (0.25 or 0.5 MG/DOSE) 2 MG/1.5ML Sopn Started by: Claretta Fraise, MD   pantoprazole 40 MG tablet Commonly known as: PROTONIX Take 1 tablet (40 mg total) by mouth daily.   Vitamin D (Ergocalciferol) 1.25 MG (50000 UNIT) Caps capsule Commonly known as: DRISDOL Take 1 capsule (50,000 Units total) by mouth every 7 (seven) days.        Follow-up: No follow-ups on file.  Claretta Fraise, M.D.

## 2020-10-18 ENCOUNTER — Encounter: Payer: Self-pay | Admitting: Family Medicine

## 2020-10-22 ENCOUNTER — Ambulatory Visit (INDEPENDENT_AMBULATORY_CARE_PROVIDER_SITE_OTHER): Payer: Commercial Managed Care - PPO | Admitting: Family Medicine

## 2020-10-22 ENCOUNTER — Encounter: Payer: Self-pay | Admitting: Family Medicine

## 2020-10-22 VITALS — BP 135/83 | HR 99 | Temp 97.5°F | Ht 62.0 in | Wt 324.0 lb

## 2020-10-22 DIAGNOSIS — R059 Cough, unspecified: Secondary | ICD-10-CM | POA: Diagnosis not present

## 2020-10-22 DIAGNOSIS — J0100 Acute maxillary sinusitis, unspecified: Secondary | ICD-10-CM | POA: Diagnosis not present

## 2020-10-22 LAB — VERITOR FLU A/B WAIVED
Influenza A: NEGATIVE
Influenza B: NEGATIVE

## 2020-10-22 MED ORDER — PREDNISONE 20 MG PO TABS: 20.0000 mg | ORAL_TABLET | Freq: Every day | ORAL | 0 refills | Status: AC

## 2020-10-22 MED ORDER — AMOXICILLIN-POT CLAVULANATE 875-125 MG PO TABS: 1.0000 | ORAL_TABLET | Freq: Two times a day (BID) | ORAL | 0 refills | Status: AC

## 2020-10-22 NOTE — Progress Notes (Signed)
Acute Office Visit  Subjective:    Patient ID: Diamond Marshall, female    DOB: 1970/05/29, 50 y.o.   MRN: [id number]  Chief Complaint  Patient presents with  . Cough  . Nasal Congestion    HPI Patient is in today for cough and congestion x 6 days. She also reports fatigue and sore throat. She started symptoms on Sunday. On Tuesday she felt back to normal and when back to work. On Thursday night she started feeling sick again with cough, congestion, fatigue, and sore throat. She has pain in her chest when she coughs. She has some shortness of breath with her coughing fits at night. Denies shortness of breath otherwise. A home covid test was negative on Monday morning. She has taken alka seltzer cold, nyquil, flonase, throat lozenges with little relief. Denies fever, body aches, chills, nausea, vomiting, or diarrhea. She has been having pain around her cheeks since Thursday as well.   Past Medical History:  Diagnosis Date  . Anemia    b12 deficiency  . Anxiety   . B12 deficiency   . Back pain   . Depression   . Gallbladder problem   . GERD (gastroesophageal reflux disease)   . Hypothyroidism   . Joint pain   . Knee pain   . Palpitation   . Pernicious anemia   . SOB (shortness of breath)   . Thyroid disease    hypothyroidism  . Vitamin D deficiency     Past Surgical History:  Procedure Laterality Date  . CESAREAN SECTION  1999  . Dilatation and currettagement     for miscarriage 18 years ago    Family History  Problem Relation Age of Onset  . Hypertension Mother   . Diabetes Mother   . High Cholesterol Mother   . Thyroid disease Mother   . Depression Mother   . Anxiety disorder Mother   . Obesity Mother   . CVA Father        hemorrhagic    Social History   Socioeconomic History  . Marital status: Married    Spouse name: Ronalee Belts  . Number of children: Not on file  . Years of education: Not on file  . Highest education level: Not on file  Occupational History   . Occupation: Adult Water engineer  Tobacco Use  . Smoking status: Never Smoker  . Smokeless tobacco: Never Used  Vaping Use  . Vaping Use: Never used  Substance and Sexual Activity  . Alcohol use: No  . Drug use: No  . Sexual activity: Yes    Birth control/protection: Pill  Other Topics Concern  . Not on file  Social History Narrative  . Not on file   Social Determinants of Health   Financial Resource Strain: Not on file  Food Insecurity: Not on file  Transportation Needs: Not on file  Physical Activity: Not on file  Stress: Not on file  Social Connections: Not on file  Intimate Partner Violence: Not on file    Outpatient Medications Prior to Visit  Medication Sig Dispense Refill  . BLISOVI FE 1.5/30 1.5-30 MG-MCG tablet TAKE 1 TABLET DAILY 28 tablet 1  . cyanocobalamin (,VITAMIN B-12,) 1000 MCG/ML injection INJECT 1 ML INTRAMUSCULARLY EVERY 15 DAYS 6 mL 0  . cyclobenzaprine (FLEXERIL) 10 MG tablet Take 1 tablet (10 mg total) by mouth 3 (three) times daily as needed for muscle spasms. 30 tablet 1  . diclofenac (VOLTAREN) 75 MG EC tablet Take 1 tablet (  75 mg total) by mouth 2 (two) times daily. 180 tablet 1  . escitalopram (LEXAPRO) 20 MG tablet Take 1 tablet (20 mg total) by mouth daily. 30 tablet 5  . levothyroxine (SYNTHROID) 50 MCG tablet Take 1 tablet (50 mcg total) by mouth daily. 90 tablet 3  . LORazepam (ATIVAN) 0.5 MG tablet Take 1 tablet (0.5 mg total) by mouth 2 (two) times daily as needed for anxiety. 30 tablet 5  . ondansetron (ZOFRAN ODT) 4 MG disintegrating tablet Take 1 tablet (4 mg total) by mouth every 8 (eight) hours as needed for nausea or vomiting. 20 tablet 0  . pantoprazole (PROTONIX) 40 MG tablet Take 1 tablet (40 mg total) by mouth daily. 30 tablet 5  . Semaglutide, 1 MG/DOSE, (OZEMPIC, 1 MG/DOSE,) 4 MG/3ML SOPN Inject 1 mg into the skin once a week. 3 mL 5  . Vitamin D, Ergocalciferol, (DRISDOL) 1.25 MG (50000 UNIT) CAPS capsule Take 1 capsule  (50,000 Units total) by mouth every 7 (seven) days. 4 capsule 0   No facility-administered medications prior to visit.    Allergies  Allergen Reactions  . Phentermine     Became suicidal on the medication  . Topiramate Other (See Comments)    dizziness  . Septra [Sulfamethoxazole-Trimethoprim] Rash    Review of Systems As per HPI.     Objective:    Physical Exam Vitals and nursing note reviewed.  Constitutional:      General: She is not in acute distress.    Appearance: She is ill-appearing. She is not toxic-appearing or diaphoretic.  HENT:     Right Ear: Tympanic membrane, ear canal and external ear normal.     Left Ear: Tympanic membrane, ear canal and external ear normal.     Nose: Congestion present.     Right Sinus: Maxillary sinus tenderness present.     Mouth/Throat:     Mouth: Mucous membranes are moist.     Pharynx: Posterior oropharyngeal erythema present. No pharyngeal swelling or oropharyngeal exudate.     Tonsils: No tonsillar exudate or tonsillar abscesses. 0 on the left.  Eyes:     Conjunctiva/sclera: Conjunctivae normal.  Cardiovascular:     Rate and Rhythm: Normal rate and regular rhythm.     Heart sounds: Normal heart sounds. No murmur heard.   Pulmonary:     Effort: Pulmonary effort is normal. No respiratory distress.     Breath sounds: Normal breath sounds. No stridor. No wheezing, rhonchi or rales.  Chest:     Chest wall: No tenderness.  Musculoskeletal:     Right lower leg: No edema.     Left lower leg: No edema.  Skin:    General: Skin is warm and dry.  Neurological:     General: No focal deficit present.     Mental Status: She is alert and oriented to person, place, and time.  Psychiatric:        Mood and Affect: Mood normal.        Behavior: Behavior normal.     BP 135/83   Pulse 99   Temp (!) 97.5 F (36.4 C)   Ht 5\' 2"  (1.575 m)   Wt (!) 324 lb (147 kg)   SpO2 95%   BMI 59.26 kg/m  Wt Readings from Last 3 Encounters:   10/22/20 (!) 324 lb (147 kg)  10/12/20 (!) 324 lb (147 kg)  08/10/20 (!) 326 lb 3.2 oz (148 kg)    Health Maintenance Due  Topic Date  Due  . Pneumococcal Vaccine 46-56 Years old (1 of 4 - PCV13) Never done  . HIV Screening  Never done  . Hepatitis C Screening  Never done  . MAMMOGRAM  11/05/2014  . COLON CANCER SCREENING ANNUAL FOBT  Never done  . COLONOSCOPY (Pts 45-42yrs Insurance coverage will need to be confirmed)  Never done  . PAP SMEAR-Modifier  02/01/2018  . COVID-19 Vaccine (4 - Booster for Moderna series) 08/10/2020    There are no preventive care reminders to display for this patient.   Lab Results  Component Value Date   TSH 2.430 05/18/2020   Lab Results  Component Value Date   WBC 11.3 (H) 05/18/2020   HGB 11.7 05/18/2020   HCT 37.4 05/18/2020   MCV 77 (L) 05/18/2020   PLT 469 (H) 05/18/2020   Lab Results  Component Value Date   NA 139 05/18/2020   K 4.7 05/18/2020   CO2 22 05/18/2020   GLUCOSE 128 (H) 05/18/2020   BUN 14 05/18/2020   CREATININE 0.83 05/18/2020   BILITOT <0.2 05/18/2020   ALKPHOS 110 05/18/2020   AST 15 05/18/2020   ALT 16 05/18/2020   PROT 7.0 05/18/2020   ALBUMIN 3.9 05/18/2020   CALCIUM 9.0 05/18/2020   ANIONGAP 12 10/27/2019   Lab Results  Component Value Date   CHOL 149 07/21/2019   Lab Results  Component Value Date   HDL 44 07/21/2019   Lab Results  Component Value Date   LDLCALC 80 07/21/2019   Lab Results  Component Value Date   TRIG 143 07/21/2019   No results found for: [facility] Lab Results  Component Value Date   HGBA1C 6.2 05/18/2020       Assessment & Plan:   Diamond Marshall was seen today for cough and nasal congestion.  Diagnoses and all orders for this visit:  Cough Negative flu. Covid test pending, quarantine until rest. Mucinex for cough, congestion.  -     Veritor Flu A/B Waived -     Novel Coronavirus, NAA ([facility])  Acute non-recurrent maxillary sinusitis Will treat with Augmentin and  prednisone as below. Rest, hydration. Mucinex for congestion.  -     amoxicillin-clavulanate (AUGMENTIN) 875-125 MG tablet; Take 1 tablet by mouth 2 (two) times daily for 10 days. -     predniSONE (DELTASONE) 20 MG tablet; Take 1 tablet (20 mg total) by mouth daily with breakfast for 3 days.  Return to office for new or worsening symptoms, or if symptoms persist.   The patient indicates understanding of these issues and agrees with the plan.    Gwenlyn Perking, FNP

## 2020-10-23 LAB — NOVEL CORONAVIRUS, NAA: SARS-CoV-2, NAA: NOT DETECTED

## 2020-10-23 LAB — SARS-COV-2, NAA 2 DAY TAT

## 2020-11-10 ENCOUNTER — Ambulatory Visit: Payer: Commercial Managed Care - PPO | Admitting: Family Medicine

## 2020-12-07 ENCOUNTER — Other Ambulatory Visit: Payer: Self-pay | Admitting: Family Medicine

## 2020-12-23 ENCOUNTER — Telehealth: Payer: Self-pay | Admitting: Family Medicine

## 2020-12-23 NOTE — Telephone Encounter (Signed)
Pt called requesting to speak with nurse. Says she has had diarrhea for over 24 hrs and she cant get rid of it. Needs advise.

## 2020-12-23 NOTE — Telephone Encounter (Signed)
Contacted patient and advised as suggested that the patient try OTC Immodium, BRAT diet and keep hydrated.  Patient verbalized understanding

## 2021-01-11 ENCOUNTER — Ambulatory Visit: Payer: Commercial Managed Care - PPO | Admitting: Family Medicine

## 2021-01-25 ENCOUNTER — Other Ambulatory Visit: Payer: Self-pay

## 2021-01-25 ENCOUNTER — Other Ambulatory Visit: Payer: Self-pay | Admitting: Family Medicine

## 2021-01-25 ENCOUNTER — Ambulatory Visit (INDEPENDENT_AMBULATORY_CARE_PROVIDER_SITE_OTHER): Payer: Commercial Managed Care - PPO | Admitting: Family Medicine

## 2021-01-25 ENCOUNTER — Encounter: Payer: Self-pay | Admitting: Family Medicine

## 2021-01-25 ENCOUNTER — Ambulatory Visit (INDEPENDENT_AMBULATORY_CARE_PROVIDER_SITE_OTHER): Payer: Commercial Managed Care - PPO

## 2021-01-25 VITALS — BP 132/92 | HR 94 | Temp 97.5°F | Ht 62.0 in | Wt 322.4 lb

## 2021-01-25 DIAGNOSIS — M25561 Pain in right knee: Secondary | ICD-10-CM

## 2021-01-25 MED ORDER — PREDNISONE 10 MG PO TABS: ORAL_TABLET | ORAL | 0 refills | Status: DC

## 2021-01-25 NOTE — Progress Notes (Signed)
Subjective:  Patient ID: Diamond Marshall, female    DOB: [birth year]  Age: 50 y.o. MRN: [id number]  CC: Knee Pain (right)   HPI Diamond Marshall presents for chronic knee pain. Old injuries. Medial joint line. Two weeks ago turned knee when she stumbled. Not improving.   Depression screen [facility] 2/9 01/25/2021 10/12/2020 08/10/2020  Decreased Interest 0 0 0  Down, Depressed, Hopeless 0 0 0  PHQ - 2 Score 0 0 0  Altered sleeping - - 1  Tired, decreased energy - - 1  Change in appetite - - 0  Feeling bad or failure about yourself  - - 0  Trouble concentrating - - 0  Moving slowly or fidgety/restless - - 0  Suicidal thoughts - - 0  PHQ-9 Score - - 2  Difficult doing work/chores - - Not difficult at all  Some recent data might be hidden    History [place] has a past medical history of Anemia, Anxiety, B12 deficiency, Back pain, Depression, Gallbladder problem, GERD (gastroesophageal reflux disease), Hypothyroidism, Joint pain, Knee pain, Palpitation, Pernicious anemia, SOB (shortness of breath), Thyroid disease, and Vitamin D deficiency.   She has a past surgical history that includes Cesarean section (1999) and Dilatation and currettagement.   Her family history includes Anxiety disorder in her mother; CVA in her father; Depression in her mother; Diabetes in her mother; High Cholesterol in her mother; Hypertension in her mother; Obesity in her mother; Thyroid disease in her mother.She reports that she has never smoked. She has never used smokeless tobacco. She reports that she does not drink alcohol and does not use drugs.    ROS Review of Systems noncontributory Objective:  BP (!) 132/92   Pulse 94   Temp (!) 97.5 F (36.4 C)   Ht '5\' 2"'$  (1.575 m)   Wt (!) 322 lb 6.4 oz (146.2 kg)   SpO2 93%   BMI 58.97 kg/m   BP Readings from Last 3 Encounters:  01/25/21 (!) 132/92  10/22/20 135/83  10/12/20 (!) 133/92    Wt Readings from Last 3 Encounters:  01/25/21 (!) 322 lb 6.4 oz (146.2  kg)  10/22/20 (!) 324 lb (147 kg)  10/12/20 (!) 324 lb (147 kg)     Physical Exam Constitutional:      Appearance: She is obese.  HENT:     Head: Normocephalic.  Musculoskeletal:        General: No deformity. Normal range of motion.     Left lower leg: No edema.  Skin:    General: Skin is warm and dry.  Neurological:     Mental Status: She is oriented to person, place, and time.  Psychiatric:        Mood and Affect: Mood normal.      Assessment & Plan:   Diamond Marshall was seen today for knee pain.  Diagnoses and all orders for this visit:  Acute pain of right knee -     DG Knee 1-2 Views Right; Future -     Ambulatory referral to Physical Therapy  Other orders -     predniSONE (DELTASONE) 10 MG tablet; Take 5 daily for 3 days followed by 4,3,2 and 1 for 3 days each.      I am having Diamond Marshall start on predniSONE. I am also having her maintain her ondansetron, Vitamin D (Ergocalciferol), levothyroxine, escitalopram, pantoprazole, LORazepam, cyanocobalamin, cyclobenzaprine, Blisovi Fe 1.5/30, Ozempic (1 MG/DOSE), and diclofenac.  Allergies as of 01/25/2021  Reactions   Phentermine    Became suicidal on the medication   Topiramate Other (See Comments)   dizziness   Septra [sulfamethoxazole-trimethoprim] Rash        Medication List        Accurate as of January 25, 2021  3:54 PM. If you have any questions, ask your nurse or doctor.          Blisovi Fe 1.5/30 1.5-30 MG-MCG tablet Generic drug: norethindrone-ethinyl estradiol-iron TAKE 1 TABLET DAILY   cyanocobalamin 1000 MCG/ML injection Commonly known as: (VITAMIN B-12) INJECT 1 ML INTRAMUSCULARLY EVERY 15 DAYS   cyclobenzaprine 10 MG tablet Commonly known as: FLEXERIL Take 1 tablet (10 mg total) by mouth 3 (three) times daily as needed for muscle spasms.   diclofenac 75 MG EC tablet Commonly known as: VOLTAREN TAKE  (1)  TABLET TWICE A DAY AS NEEDED.   escitalopram 20 MG  tablet Commonly known as: LEXAPRO Take 1 tablet (20 mg total) by mouth daily.   levothyroxine 50 MCG tablet Commonly known as: SYNTHROID Take 1 tablet (50 mcg total) by mouth daily.   LORazepam 0.5 MG tablet Commonly known as: ATIVAN Take 1 tablet (0.5 mg total) by mouth 2 (two) times daily as needed for anxiety.   ondansetron 4 MG disintegrating tablet Commonly known as: Zofran ODT Take 1 tablet (4 mg total) by mouth every 8 (eight) hours as needed for nausea or vomiting.   Ozempic (1 MG/DOSE) 4 MG/3ML Sopn Generic drug: Semaglutide (1 MG/DOSE) Inject 1 mg into the skin once a week.   pantoprazole 40 MG tablet Commonly known as: PROTONIX Take 1 tablet (40 mg total) by mouth daily.   predniSONE 10 MG tablet Commonly known as: DELTASONE Take 5 daily for 3 days followed by 4,3,2 and 1 for 3 days each. Started by: Claretta Fraise, MD   Vitamin D (Ergocalciferol) 1.25 MG (50000 UNIT) Caps capsule Commonly known as: DRISDOL Take 1 capsule (50,000 Units total) by mouth every 7 (seven) days.         Follow-up: Return in about 6 weeks (around 03/08/2021).  Claretta Fraise, M.D.

## 2021-01-29 IMAGING — CT CT ABD-PELV W/ CM
2 of 5 series · 16 of 46 positions shown, 18 images · IV contrast (Omnipaque or Isovue)
Comparison: None.

CLINICAL DATA: Abdominal distension.  Nausea and vomiting.

EXAM:
CT ABDOMEN AND PELVIS WITH CONTRAST
TECHNIQUE: Multidetector CT imaging of the abdomen and pelvis was performed
using the standard protocol following bolus administration of
intravenous contrast.
CONTRAST:  100mL OMNIPAQUE IOHEXOL 300 MG/ML  SOLN

[Series 2: axial st · axial · 0.87mm/px · z∈[-722,-272]mm · 13 of 102 slices shown, 15 images]
[im 6/102  soft-tissue]
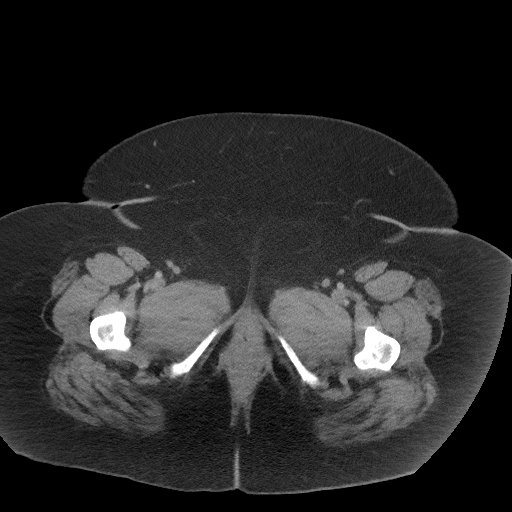
[im 6/102  bone]
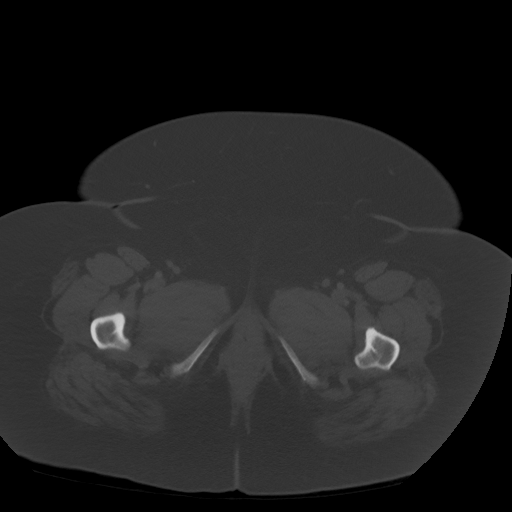
[im 12/102  soft-tissue]
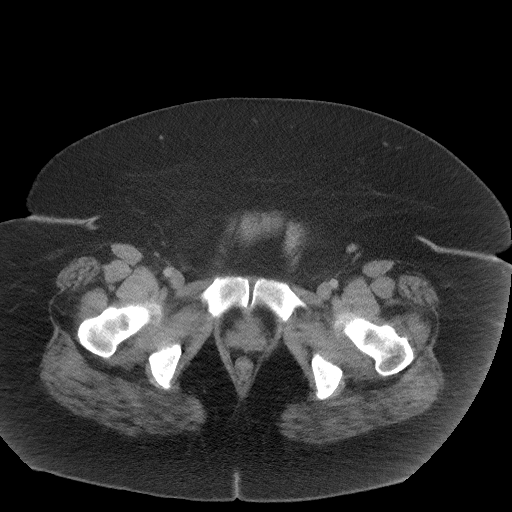
[im 24/102  soft-tissue]
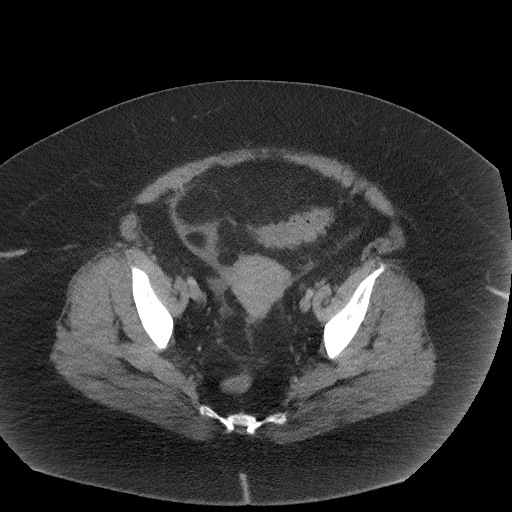
[im 30/102  soft-tissue]
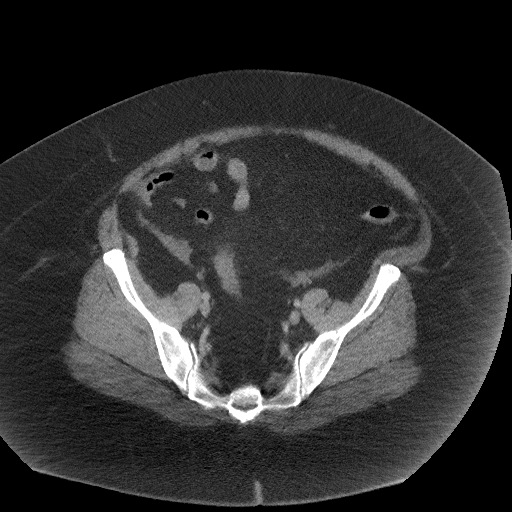
[im 36/102  soft-tissue]
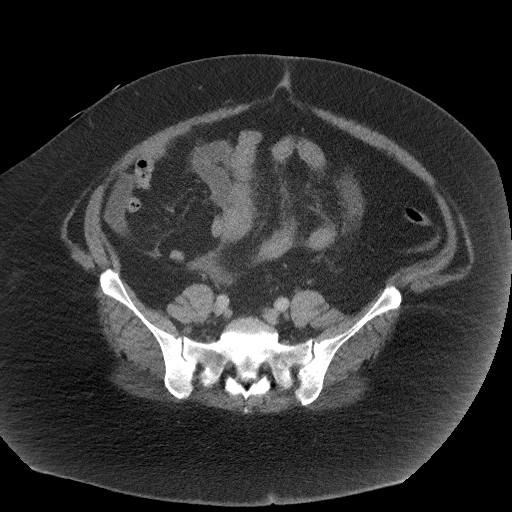
[im 42/102  soft-tissue]
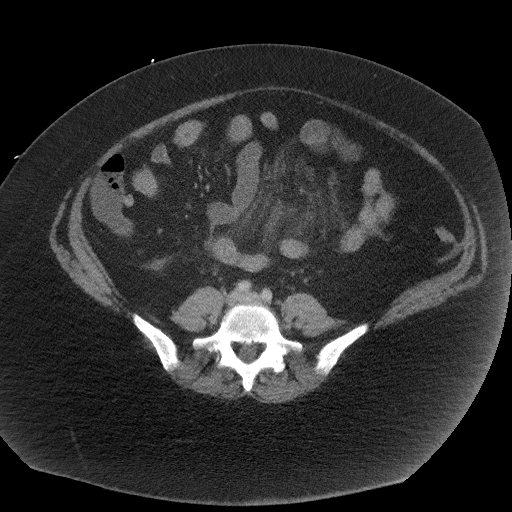
[im 54/102  soft-tissue]
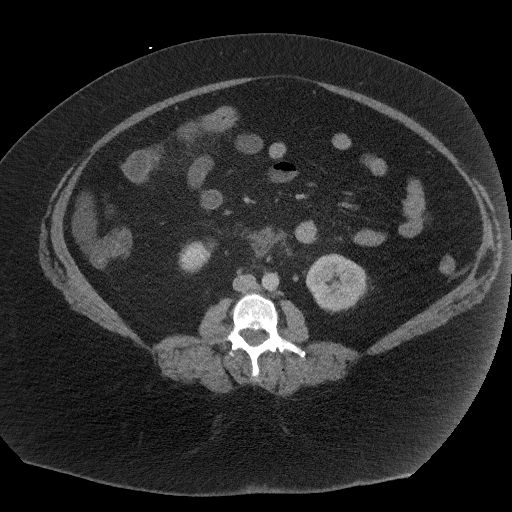
[im 60/102  soft-tissue]
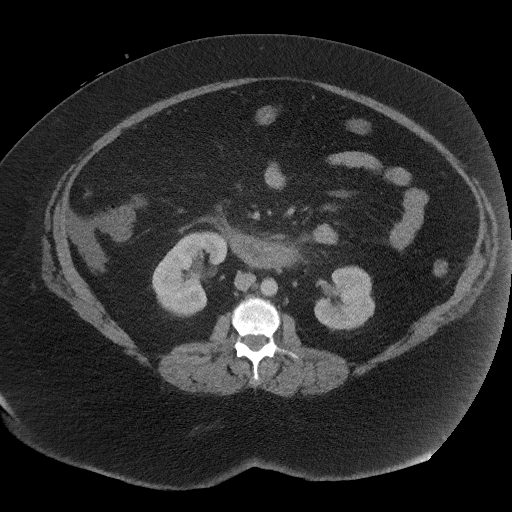
[im 66/102  soft-tissue]
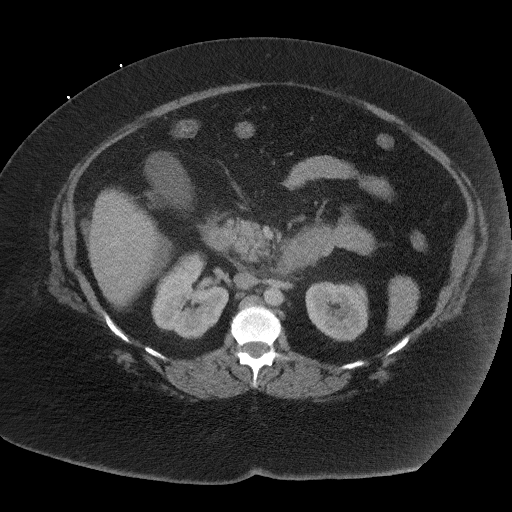
[im 66/102  bone]
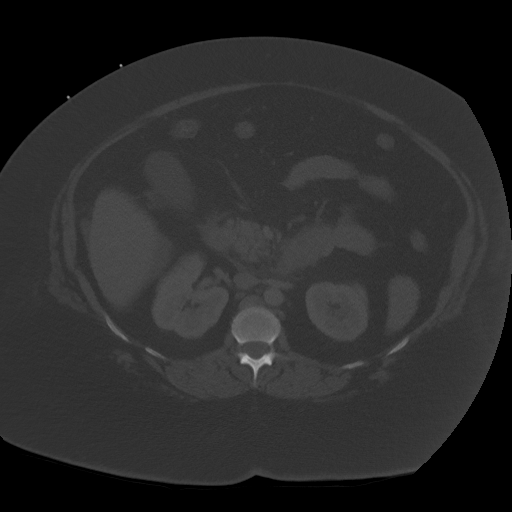
[im 72/102  soft-tissue]
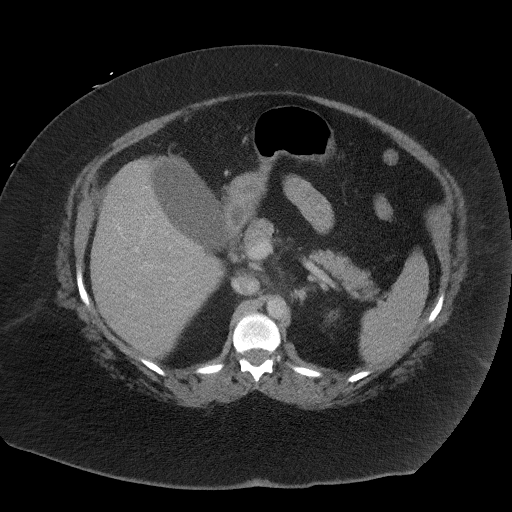
[im 78/102  soft-tissue]
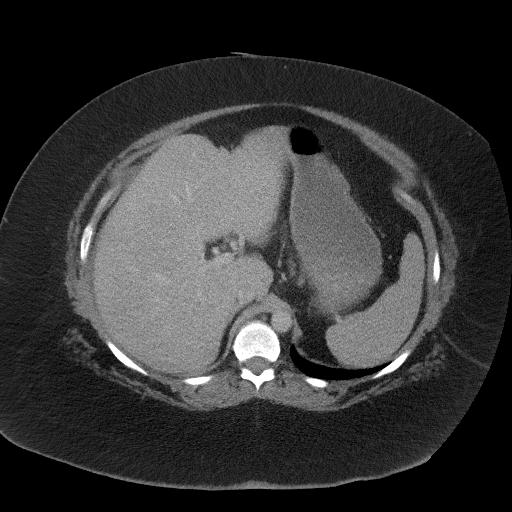
[im 90/102  soft-tissue]
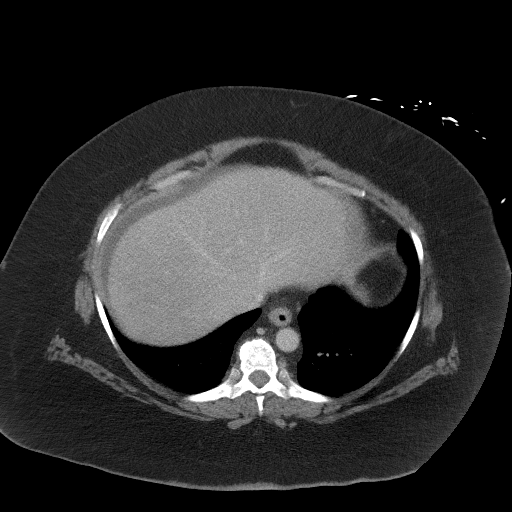
[im 96/102  soft-tissue]
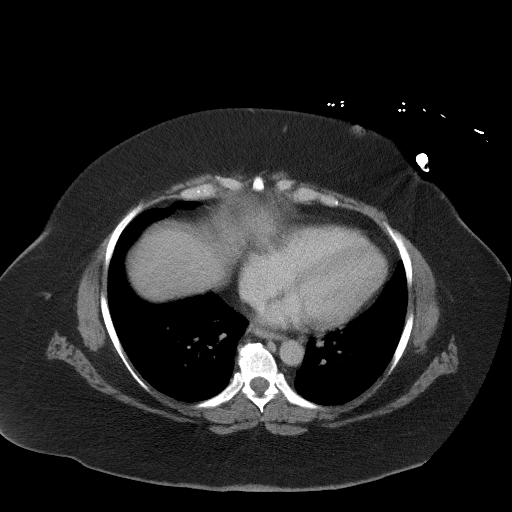

[Series 5: coronal st · coronal · 0.99mm/px · 3 of 142 slices shown]
[im 48/142  soft-tissue]
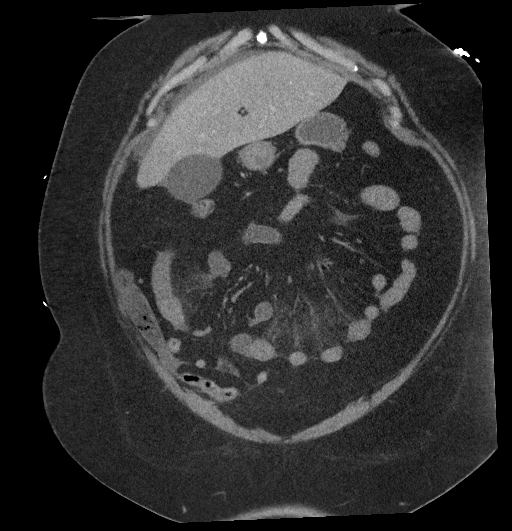
[im 63/142  soft-tissue]
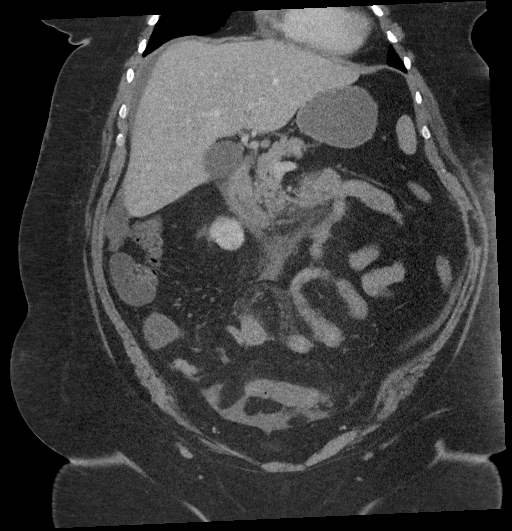
[im 79/142  soft-tissue]
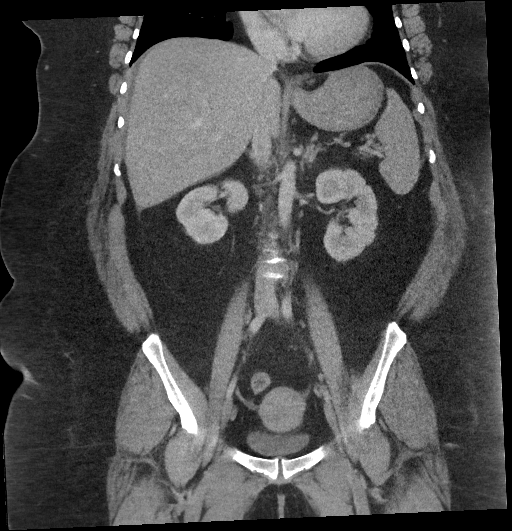

[16 of 46 positions shown; findings below may reference images not displayed]

FINDINGS: Lower chest: The lung bases are clear. The heart size is normal.

Hepatobiliary: There is decreased hepatic attenuation suggestive of
hepatic steatosis. Gallbladder is distended without definite CT
evidence for acute cholecystitis.There is no biliary ductal
dilation.

Pancreas: Normal contours without ductal dilatation. No
peripancreatic fluid collection.

Spleen: Unremarkable.

Adrenals/Urinary Tract:

--Adrenal glands: Unremarkable.

--Right kidney/ureter: No hydronephrosis or radiopaque kidney
stones.

--Left kidney/ureter: No hydronephrosis or radiopaque kidney stones.

--Urinary bladder: Unremarkable.

Stomach/Bowel:

--Stomach/Duodenum: There is diffuse wall thickening throughout the
duodenum.

--Small bowel: There is no evidence for small bowel obstruction. Are
scattered areas of circumferential wall thickening involving the
small-bowel intermixed with normal appearing areas.

--Colon: The colon is relatively decompressed which limits
evaluation

--Appendix: Normal.

Vascular/Lymphatic: Atherosclerotic calcification is present within
the non-aneurysmal abdominal aorta, without hemodynamically
significant stenosis.

--No retroperitoneal lymphadenopathy.

--No mesenteric lymphadenopathy.

--No pelvic or inguinal lymphadenopathy.

Reproductive: Unremarkable

Other: There is a small amount of free fluid in the patient's
abdomen and pelvis. There is edema of the mesentery.

Musculoskeletal. No acute displaced fractures.
IMPRESSION: 1. Overall findings suspicious for duodenitis with infectious or
inflammatory enteritis. There is no evidence for small bowel
obstruction.
2. There is a small volume of free fluid in the abdomen and pelvis,
presumably reactive.
3. Hepatic steatosis.
4. Relative underdistention of the colon which limits evaluation.
There may be some areas of wall thickening of the colon, especially
at the level of the sigmoid colon which may indicate an underlying
colitis.

## 2021-02-04 ENCOUNTER — Encounter: Payer: Self-pay | Admitting: Family Medicine

## 2021-02-10 ENCOUNTER — Ambulatory Visit: Payer: Commercial Managed Care - PPO | Admitting: Family Medicine

## 2021-02-14 ENCOUNTER — Encounter: Payer: Self-pay | Admitting: Family Medicine

## 2021-02-16 ENCOUNTER — Other Ambulatory Visit: Payer: Self-pay | Admitting: Family Medicine

## 2021-03-08 ENCOUNTER — Ambulatory Visit: Payer: Commercial Managed Care - PPO | Admitting: Family Medicine

## 2021-03-14 ENCOUNTER — Telehealth: Payer: Self-pay | Admitting: Family Medicine

## 2021-03-14 NOTE — Telephone Encounter (Signed)
Patient needs to be seen and I am making appt

## 2021-03-15 ENCOUNTER — Ambulatory Visit (INDEPENDENT_AMBULATORY_CARE_PROVIDER_SITE_OTHER): Payer: Commercial Managed Care - PPO | Admitting: Nurse Practitioner

## 2021-03-15 ENCOUNTER — Encounter: Payer: Self-pay | Admitting: Nurse Practitioner

## 2021-03-15 DIAGNOSIS — U071 COVID-19: Secondary | ICD-10-CM | POA: Insufficient documentation

## 2021-03-15 MED ORDER — NIRMATRELVIR/RITONAVIR (PAXLOVID)TABLET: 3.0000 | ORAL_TABLET | Freq: Two times a day (BID) | ORAL | 0 refills | Status: AC

## 2021-03-15 NOTE — Patient Instructions (Signed)

## 2021-03-15 NOTE — Progress Notes (Signed)
   Virtual Visit  Note Due to COVID-19 pandemic this visit was conducted virtually. This visit type was conducted due to national recommendations for restrictions regarding the COVID-19 Pandemic (e.g. social distancing, sheltering in place) in an effort to limit this patient's exposure and mitigate transmission in our community. All issues noted in this document were discussed and addressed.  A physical exam was not performed with this format.  I connected with Diamond Marshall on 03/15/21 at 10:15 AM by telephone and verified that I am speaking with the correct person using two identifiers. Diamond Marshall is currently located at home during visit. The provider, Ivy Lynn, NP is located in their office at time of visit.  I discussed the limitations, risks, security and privacy concerns of performing an evaluation and management service by telephone and the availability of in person appointments. I also discussed with the patient that there may be a patient responsible charge related to this service. The patient expressed understanding and agreed to proceed.   History and Present Illness:  Cough This is a new problem. The current episode started yesterday. The problem has been gradually worsening. The problem occurs constantly. The cough is Non-productive. Associated symptoms include chills, a fever and headaches. Pertinent negatives include no ear congestion or ear pain. Exacerbated by: Tested positive for covid.     Review of Systems  Constitutional:  Positive for chills and fever.  HENT:  Negative for ear pain.   Respiratory:  Positive for cough.   Neurological:  Positive for headaches.    Observations/Objective: Tele-visit patient not in distress.  Assessment and Plan: Take meds as prescribed - Use a cool mist humidifier  -Use saline nose sprays frequently -Force fluids -For fever or aches or pains- take Tylenol or ibuprofen. -at home COVID test positive for COVID-19 -Paxlovid  antiviral RX sent to pharmacy, education provided to patient, patient verbalize understanding. Follow up with worsening unresolved symptoms   Follow Up Instructions: Follow-up with worsening unresolved symptoms.    I discussed the assessment and treatment plan with the patient. The patient was provided an opportunity to ask questions and all were answered. The patient agreed with the plan and demonstrated an understanding of the instructions.   The patient was advised to call back or seek an in-person evaluation if the symptoms worsen or if the condition fails to improve as anticipated.  The above assessment and management plan was discussed with the patient. The patient verbalized understanding of and has agreed to the management plan. Patient is aware to call the clinic if symptoms persist or worsen. Patient is aware when to return to the clinic for a follow-up visit. Patient educated on when it is appropriate to go to the emergency department.   Time call ended:  10:25 am  I provided 10 minutes of  non face-to-face time during this encounter.    Ivy Lynn, NP

## 2021-03-15 NOTE — Assessment & Plan Note (Signed)
Take meds as prescribed - Use a cool mist humidifier  -Use saline nose sprays frequently -Force fluids -For fever or aches or pains- take Tylenol or ibuprofen. -at home COVID test positive for COVID-19 -Paxlovid antiviral RX sent to pharmacy, education provided to patient, patient verbalize understanding. Follow up with worsening unresolved symptoms

## 2021-03-18 ENCOUNTER — Telehealth: Payer: Self-pay | Admitting: Family Medicine

## 2021-03-18 ENCOUNTER — Encounter: Payer: Self-pay | Admitting: Nurse Practitioner

## 2021-03-18 NOTE — Telephone Encounter (Signed)
Patient aware on mychart

## 2021-06-08 ENCOUNTER — Other Ambulatory Visit: Payer: Self-pay | Admitting: Family Medicine

## 2021-06-08 DIAGNOSIS — F32A Depression, unspecified: Secondary | ICD-10-CM

## 2021-06-20 ENCOUNTER — Other Ambulatory Visit: Payer: Self-pay | Admitting: Family Medicine

## 2021-07-15 ENCOUNTER — Other Ambulatory Visit: Payer: Self-pay | Admitting: Family Medicine

## 2021-07-15 DIAGNOSIS — F32A Depression, unspecified: Secondary | ICD-10-CM

## 2021-07-15 DIAGNOSIS — F419 Anxiety disorder, unspecified: Secondary | ICD-10-CM

## 2021-07-15 MED ORDER — ESCITALOPRAM OXALATE 20 MG PO TABS: 20.0000 mg | ORAL_TABLET | Freq: Every day | ORAL | 0 refills | Status: DC

## 2021-07-15 NOTE — Telephone Encounter (Signed)
Pt made appt for 07/21/21 with PCP

## 2021-07-15 NOTE — Addendum Note (Signed)
Addended by: Antonietta Barcelona D on: 07/15/2021 01:11 PM   Modules accepted: Orders

## 2021-07-15 NOTE — Telephone Encounter (Signed)
Patient was given 30 days on 06/08/21 ntbs for further refills

## 2021-07-21 ENCOUNTER — Ambulatory Visit (INDEPENDENT_AMBULATORY_CARE_PROVIDER_SITE_OTHER): Payer: Commercial Managed Care - PPO | Admitting: Family Medicine

## 2021-07-21 ENCOUNTER — Encounter: Payer: Self-pay | Admitting: Family Medicine

## 2021-07-21 VITALS — BP 132/88 | HR 102 | Temp 97.5°F | Ht 62.0 in | Wt 319.8 lb

## 2021-07-21 DIAGNOSIS — D519 Vitamin B12 deficiency anemia, unspecified: Secondary | ICD-10-CM

## 2021-07-21 DIAGNOSIS — E7849 Other hyperlipidemia: Secondary | ICD-10-CM

## 2021-07-21 DIAGNOSIS — E538 Deficiency of other specified B group vitamins: Secondary | ICD-10-CM

## 2021-07-21 DIAGNOSIS — F32A Depression, unspecified: Secondary | ICD-10-CM

## 2021-07-21 DIAGNOSIS — I10 Essential (primary) hypertension: Secondary | ICD-10-CM | POA: Diagnosis not present

## 2021-07-21 DIAGNOSIS — E559 Vitamin D deficiency, unspecified: Secondary | ICD-10-CM | POA: Diagnosis not present

## 2021-07-21 DIAGNOSIS — R7303 Prediabetes: Secondary | ICD-10-CM | POA: Diagnosis not present

## 2021-07-21 DIAGNOSIS — E038 Other specified hypothyroidism: Secondary | ICD-10-CM

## 2021-07-21 DIAGNOSIS — F419 Anxiety disorder, unspecified: Secondary | ICD-10-CM

## 2021-07-21 DIAGNOSIS — J0100 Acute maxillary sinusitis, unspecified: Secondary | ICD-10-CM

## 2021-07-21 LAB — BAYER DCA HB A1C WAIVED: HB A1C (BAYER DCA - WAIVED): 5.7 % — ABNORMAL HIGH (ref 4.8–5.6)

## 2021-07-21 MED ORDER — PANTOPRAZOLE SODIUM 40 MG PO TBEC: 80.0000 mg | DELAYED_RELEASE_TABLET | Freq: Every day | ORAL | 3 refills | Status: DC

## 2021-07-21 MED ORDER — LEVOTHYROXINE SODIUM 50 MCG PO TABS: 50.0000 ug | ORAL_TABLET | Freq: Every day | ORAL | 3 refills | Status: DC

## 2021-07-21 MED ORDER — PSEUDOEPHEDRINE-GUAIFENESIN ER 120-1200 MG PO TB12: 1.0000 | ORAL_TABLET | Freq: Two times a day (BID) | ORAL | 0 refills | Status: DC

## 2021-07-21 MED ORDER — AMOXICILLIN-POT CLAVULANATE 875-125 MG PO TABS: 1.0000 | ORAL_TABLET | Freq: Two times a day (BID) | ORAL | 0 refills | Status: DC

## 2021-07-21 MED ORDER — ESCITALOPRAM OXALATE 20 MG PO TABS: 20.0000 mg | ORAL_TABLET | Freq: Every day | ORAL | 3 refills | Status: DC

## 2021-07-21 MED ORDER — OZEMPIC (1 MG/DOSE) 4 MG/3ML ~~LOC~~ SOPN: 1.0000 mg | PEN_INJECTOR | SUBCUTANEOUS | 5 refills | Status: DC

## 2021-07-21 NOTE — Progress Notes (Signed)
? ?Subjective:  ?Patient ID: Diamond Marshall, female    DOB: 14-Apr-1971  Age: 51 y.o. MRN: [id number] ? ?CC: Medical Management of Chronic Issues ? ? ?HPI ?Diamond Marshall presents for Patient in for follow-up of GERD. Currently symptomatic taking  PPI daily. There is heartburn. Waking up in the night with choking sensation, vomiting. No hematemesis and no melena. No dysphagia or choking. Onset is remote. Progression is stable. Complicating factors, none. ? ?Panic attack most recent 8 mos ago. Hasn't used ativan since.  ? ? ?Not currently taking thyroid med and Ozempic. Having a hard time losing weight. Due for A1c and TSH, FT4 ? ?Depression screen [facility] 2/9 07/21/2021 07/21/2021 01/25/2021  ?Decreased Interest 0 0 0  ?Down, Depressed, Hopeless 0 0 0  ?PHQ - 2 Score 0 0 0  ?Altered sleeping 0 - -  ?Tired, decreased energy 1 - -  ?Change in appetite 1 - -  ?Feeling bad or failure about yourself  0 - -  ?Trouble concentrating 0 - -  ?Moving slowly or fidgety/restless 0 - -  ?Suicidal thoughts 0 - -  ?PHQ-9 Score 2 - -  ?Difficult doing work/chores Not difficult at all - -  ?Some recent data might be hidden  ? ? ?History ?Diamond Marshall has a past medical history of Anemia, Anxiety, B12 deficiency, Back pain, Depression, Gallbladder problem, GERD (gastroesophageal reflux disease), Hypothyroidism, Joint pain, Knee pain, Palpitation, Pernicious anemia, SOB (shortness of breath), Thyroid disease, and Vitamin D deficiency.  ? ?She has a past surgical history that includes Cesarean section (1999) and Dilatation and currettagement.  ? ?Her family history includes Anxiety disorder in her mother; CVA in her father; Depression in her mother; Diabetes in her mother; High Cholesterol in her mother; Hypertension in her mother; Obesity in her mother; Thyroid disease in her mother.She reports that she has never smoked. She has never used smokeless tobacco. She reports that she does not drink alcohol and does not use drugs. ? ? ? ?ROS ?Review of Systems   ?Constitutional:  Negative for activity change, appetite change, chills and fever.  ?HENT:  Positive for congestion, postnasal drip, rhinorrhea and sinus pressure. Negative for ear discharge, ear pain, hearing loss, nosebleeds, sneezing and trouble swallowing.   ?Respiratory:  Negative for chest tightness and shortness of breath.   ?Cardiovascular:  Negative for chest pain and palpitations.  ?Skin:  Negative for rash.  ? ?Objective:  ?BP 132/88   Pulse (!) 102   Temp (!) 97.5 ?F (36.4 ?C)   Ht _0  (1.575 m)   Wt (!) 319 lb 12.8 oz (145.1 kg)   SpO2 96%   BMI 58.49 kg/m?  ? ? ? ?Wt Readings from Last 3 Encounters:  ?07/21/21 (!) 319 lb 12.8 oz (145.1 kg)  ?01/25/21 (!) 322 lb 6.4 oz (146.2 kg)  ?10/22/20 (!) 324 lb (147 kg)  ? ? ? ?Physical Exam ?Constitutional:   ?   General: She is not in acute distress. ?   Appearance: She is well-developed.  ?HENT:  ?   Head: Normocephalic and atraumatic.  ?   Right Ear: Tympanic membrane and external ear normal. No decreased hearing noted.  ?   Left Ear: Tympanic membrane and external ear normal. No decreased hearing noted.  ?   Nose: Mucosal edema, congestion and rhinorrhea present.  ?   Right Sinus: No frontal sinus tenderness.  ?   Left Sinus: No frontal sinus tenderness.  ?   Mouth/Throat:  ?  Pharynx: No oropharyngeal exudate or posterior oropharyngeal erythema.  ?Neck:  ?   Meningeal: Brudzinski's sign absent.  ?Cardiovascular:  ?   Rate and Rhythm: Normal rate and regular rhythm.  ?Pulmonary:  ?   Effort: No respiratory distress.  ?   Breath sounds: Normal breath sounds.  ?Musculoskeletal:     ?   General: Normal range of motion.  ?Lymphadenopathy:  ?   Head:  ?   Right side of head: No preauricular adenopathy.  ?   Left side of head: No preauricular adenopathy.  ?   Cervical:  ?   Right cervical: No superficial cervical adenopathy. ?   Left cervical: No superficial cervical adenopathy.  ?Skin: ?   General: Skin is warm and dry.  ?Neurological:  ?   Mental  Status: She is alert and oriented to person, place, and time.  ? ? ? ? ?Assessment & Plan:  ? ?Diamond Marshall was seen today for medical management of chronic issues. ? ?Diagnoses and all orders for this visit: ? ?Prediabetes ?-     Bayer DCA Hb A1c Waived ? ?Vitamin D deficiency ?-     VITAMIN D 25 Hydroxy (Vit-D Deficiency, Fractures) ? ?Essential hypertension ?-     CBC with Differential/Platelet ?-     CMP14+EGFR ? ?Other hyperlipidemia ?-     Lipid panel ? ?Anemia due to vitamin B12 deficiency, unspecified B12 deficiency type ?-     Iron, TIBC and Ferritin Panel ? ?Other specified hypothyroidism ?-     TSH + free T4 ? ?B12 nutritional deficiency ?-     Vitamin B12 ? ?Anxiety and depression ?-     escitalopram (LEXAPRO) 20 MG tablet; Take 1 tablet (20 mg total) by mouth daily. ? ?Acute maxillary sinusitis, recurrence not specified ? ?Other orders ?-     levothyroxine (SYNTHROID) 50 MCG tablet; Take 1 tablet (50 mcg total) by mouth daily. ?-     pantoprazole (PROTONIX) 40 MG tablet; Take 2 tablets (80 mg total) by mouth daily. ?-     Semaglutide, 1 MG/DOSE, (OZEMPIC, 1 MG/DOSE,) 4 MG/3ML SOPN; Inject 1 mg into the skin once a week. ?-     amoxicillin-clavulanate (AUGMENTIN) 875-125 MG tablet; Take 1 tablet by mouth 2 (two) times daily. Take all of this medication ?-     Pseudoephedrine-Guaifenesin [phone] MG TB12; Take 1 tablet by mouth 2 (two) times daily. For congestion ? ? ? ? ? ? ?I have discontinued Diamond Marshall's ondansetron, Vitamin D (Ergocalciferol), and predniSONE. I have also changed her pantoprazole. Additionally, I am having her start on amoxicillin-clavulanate and Pseudoephedrine-Guaifenesin. Lastly, I am having her maintain her LORazepam, cyclobenzaprine, diclofenac, cyanocobalamin, escitalopram, levothyroxine, and Ozempic (1 MG/DOSE). ? ?Allergies as of 07/21/2021   ? ?   Reactions  ? Phentermine   ? Became suicidal on the medication  ? Topiramate Other (See Comments)  ? dizziness  ? Septra  [sulfamethoxazole-trimethoprim] Rash  ? ?  ? ?  ?Medication List  ?  ? ?  ? Accurate as of July 21, 2021  3:39 PM. If you have any questions, ask your nurse or doctor.  ?  ?  ? ?  ? ?STOP taking these medications   ? ?ondansetron 4 MG disintegrating tablet ?Commonly known as: Zofran ODT ?Stopped by: Claretta Fraise, MD ?  ?predniSONE 10 MG tablet ?Commonly known as: DELTASONE ?Stopped by: Claretta Fraise, MD ?  ?Vitamin D (Ergocalciferol) 1.25 MG (50000 UNIT) Caps capsule ?Commonly known as: DRISDOL ?Stopped  by: Claretta Fraise, MD ?  ? ?  ? ?TAKE these medications   ? ?amoxicillin-clavulanate 875-125 MG tablet ?Commonly known as: AUGMENTIN ?Take 1 tablet by mouth 2 (two) times daily. Take all of this medication ?Started by: Claretta Fraise, MD ?  ?cyanocobalamin 1000 MCG/ML injection ?Commonly known as: (VITAMIN B-12) ?INJECT 1 ML INTRAMUSCULARLY EVERY 15 DAYS ?  ?cyclobenzaprine 10 MG tablet ?Commonly known as: FLEXERIL ?Take 1 tablet (10 mg total) by mouth 3 (three) times daily as needed for muscle spasms. ?  ?diclofenac 75 MG EC tablet ?Commonly known as: VOLTAREN ?TAKE  (1)  TABLET TWICE A DAY AS NEEDED. ?  ?escitalopram 20 MG tablet ?Commonly known as: LEXAPRO ?Take 1 tablet (20 mg total) by mouth daily. ?  ?levothyroxine 50 MCG tablet ?Commonly known as: SYNTHROID ?Take 1 tablet (50 mcg total) by mouth daily. ?  ?LORazepam 0.5 MG tablet ?Commonly known as: ATIVAN ?Take 1 tablet (0.5 mg total) by mouth 2 (two) times daily as needed for anxiety. ?  ?Ozempic (1 MG/DOSE) 4 MG/3ML Sopn ?Generic drug: Semaglutide (1 MG/DOSE) ?Inject 1 mg into the skin once a week. ?  ?pantoprazole 40 MG tablet ?Commonly known as: PROTONIX ?Take 2 tablets (80 mg total) by mouth daily. ?What changed:  ?how much to take ?additional instructions ?Changed by: Claretta Fraise, MD ?  ?Pseudoephedrine-Guaifenesin [phone] MG Tb12 ?Take 1 tablet by mouth 2 (two) times daily. For congestion ?Started by: Claretta Fraise, MD ?  ? ?  ? ? ? ?Follow-up:  Return in about 3 months (around 10/21/2021). ? ?Claretta Fraise, M.D. ?

## 2021-07-22 LAB — LIPID PANEL

## 2021-07-23 LAB — LIPID PANEL
Chol/HDL Ratio: 4 ratio (ref 0.0–4.4)
Cholesterol, Total: 153 mg/dL (ref 100–199)
HDL: 38 mg/dL — ABNORMAL LOW (ref >39)
LDL Chol Calc (NIH): 88 mg/dL (ref 0–99)
Triglycerides: 152 mg/dL — ABNORMAL HIGH (ref 0–149)
VLDL Cholesterol Cal: 27 mg/dL (ref 5–40)

## 2021-07-23 LAB — CBC WITH DIFFERENTIAL/PLATELET
Basophils Absolute: 0.1 10*3/uL (ref 0.0–0.2)
Basos: 1 %
EOS (ABSOLUTE): 0.2 10*3/uL (ref 0.0–0.4)
Eos: 3 %
Hematocrit: 37.6 % (ref 34.0–46.6)
Hemoglobin: 12 g/dL (ref 11.1–15.9)
Immature Grans (Abs): 0 10*3/uL (ref 0.0–0.1)
Immature Granulocytes: 0 %
Lymphocytes Absolute: 1.9 10*3/uL (ref 0.7–3.1)
Lymphs: 27 %
MCH: 24.2 pg — ABNORMAL LOW (ref 26.6–33.0)
MCHC: 31.9 g/dL (ref 31.5–35.7)
MCV: 76 fL — ABNORMAL LOW (ref 79–97)
Monocytes Absolute: 0.5 10*3/uL (ref 0.1–0.9)
Monocytes: 6 %
Neutrophils Absolute: 4.4 10*3/uL (ref 1.4–7.0)
Neutrophils: 63 %
Platelets: 359 10*3/uL (ref 150–450)
RBC: 4.95 x10E6/uL (ref 3.77–5.28)
RDW: 15.7 % — ABNORMAL HIGH (ref 11.7–15.4)
WBC: 7.1 10*3/uL (ref 3.4–10.8)

## 2021-07-23 LAB — IRON,TIBC AND FERRITIN PANEL
Ferritin: 70 ng/mL (ref 15–150)
Iron Saturation: 8 % — CL (ref 15–55)
Iron: 26 ug/dL — ABNORMAL LOW (ref 27–159)
Total Iron Binding Capacity: 316 ug/dL (ref 250–450)
UIBC: 290 ug/dL (ref 131–425)

## 2021-07-23 LAB — CMP14+EGFR
ALT: 19 IU/L (ref 0–32)
AST: 23 IU/L (ref 0–40)
Albumin/Globulin Ratio: 1.5 (ref 1.2–2.2)
Albumin: 4 g/dL (ref 3.8–4.8)
Alkaline Phosphatase: 135 IU/L — ABNORMAL HIGH (ref 44–121)
BUN/Creatinine Ratio: 23 (ref 9–23)
BUN: 17 mg/dL (ref 6–24)
Bilirubin Total: <0.2 mg/dL (ref 0.0–1.2)
CO2: 23 mmol/L (ref 20–29)
Calcium: 9.3 mg/dL (ref 8.7–10.2)
Chloride: 103 mmol/L (ref 96–106)
Creatinine, Ser: 0.74 mg/dL (ref 0.57–1.00)
Globulin, Total: 2.7 g/dL (ref 1.5–4.5)
Glucose: 120 mg/dL — ABNORMAL HIGH (ref 70–99)
Potassium: 4.2 mmol/L (ref 3.5–5.2)
Sodium: 145 mmol/L — ABNORMAL HIGH (ref 134–144)
Total Protein: 6.7 g/dL (ref 6.0–8.5)
eGFR: 99 mL/min/{1.73_m2} (ref >59)

## 2021-07-23 LAB — TSH+FREE T4
Free T4: 0.93 ng/dL (ref 0.82–1.77)
TSH: 4.41 u[IU]/mL (ref 0.450–4.500)

## 2021-07-23 LAB — VITAMIN D 25 HYDROXY (VIT D DEFICIENCY, FRACTURES): Vit D, 25-Hydroxy: 27 ng/mL — ABNORMAL LOW (ref 30.0–100.0)

## 2021-07-23 LAB — VITAMIN B12: Vitamin B-12: 515 pg/mL (ref 232–1245)

## 2021-07-24 ENCOUNTER — Encounter: Payer: Self-pay | Admitting: Family Medicine

## 2021-07-28 ENCOUNTER — Encounter: Payer: Self-pay | Admitting: Family Medicine

## 2021-07-28 ENCOUNTER — Other Ambulatory Visit: Payer: Self-pay | Admitting: Family Medicine

## 2021-07-28 MED ORDER — AMOXICILLIN-POT CLAVULANATE 875-125 MG PO TABS: 1.0000 | ORAL_TABLET | Freq: Two times a day (BID) | ORAL | 0 refills | Status: DC

## 2021-07-28 MED ORDER — PREDNISONE 10 MG PO TABS: ORAL_TABLET | ORAL | 0 refills | Status: DC

## 2021-08-19 ENCOUNTER — Other Ambulatory Visit: Payer: Self-pay | Admitting: Family Medicine

## 2021-10-12 ENCOUNTER — Encounter: Payer: Self-pay | Admitting: Family Medicine

## 2021-10-18 ENCOUNTER — Other Ambulatory Visit: Payer: Self-pay | Admitting: Family Medicine

## 2021-10-18 MED ORDER — TRULICITY 0.75 MG/0.5ML ~~LOC~~ SOAJ: 0.7500 mg | SUBCUTANEOUS | 3 refills | Status: DC

## 2021-10-24 ENCOUNTER — Ambulatory Visit: Payer: Commercial Managed Care - PPO | Admitting: Family Medicine

## 2021-11-23 ENCOUNTER — Other Ambulatory Visit: Payer: Self-pay | Admitting: Family Medicine

## 2021-12-16 ENCOUNTER — Ambulatory Visit (INDEPENDENT_AMBULATORY_CARE_PROVIDER_SITE_OTHER): Payer: Commercial Managed Care - PPO | Admitting: Family Medicine

## 2021-12-16 ENCOUNTER — Encounter: Payer: Self-pay | Admitting: Family Medicine

## 2021-12-16 VITALS — BP 131/81 | HR 87 | Temp 98.4°F | Ht 62.0 in | Wt 329.0 lb

## 2021-12-16 DIAGNOSIS — T63461A Toxic effect of venom of wasps, accidental (unintentional), initial encounter: Secondary | ICD-10-CM

## 2021-12-16 MED ORDER — PREDNISONE 20 MG PO TABS: 40.0000 mg | ORAL_TABLET | Freq: Every day | ORAL | 0 refills | Status: AC

## 2021-12-16 MED ORDER — EPINEPHRINE 0.3 MG/0.3ML IJ SOAJ: 0.3000 mg | INTRAMUSCULAR | 0 refills | Status: AC | PRN

## 2021-12-16 MED ORDER — METHYLPREDNISOLONE ACETATE 80 MG/ML IJ SUSP
80.0000 mg | Freq: Once | INTRAMUSCULAR | Status: AC
  Administered 2021-12-16: 80 mg via INTRAMUSCULAR

## 2021-12-16 NOTE — Progress Notes (Signed)
Assessment & Plan:  1. Wasp sting, accidental or unintentional, initial encounter Education provided on bee, wasp, or hornet stings. Cold pack provided in the office. Encouraged use of antihistamine and cold packs. Depo-Medrol given in office. Advised to start Prednisone tomorrow. Discussed appropriate use of an Epipen and prescribed in the event her reaction is worse next time.  - predniSONE (DELTASONE) 20 MG tablet; Take 2 tablets (40 mg total) by mouth daily with breakfast for 5 days.  Dispense: 10 tablet; Refill: 0 - EPINEPHrine 0.3 mg/0.3 mL IJ SOAJ injection; Inject 0.3 mg into the muscle as needed for anaphylaxis.  Dispense: 2 each; Refill: 0 - methylPREDNISolone acetate (DEPO-MEDROL) injection 80 mg   Follow up plan: Return if symptoms worsen or fail to improve.  Hendricks Limes, MSN, APRN, FNP-C [facility]  Subjective:   Patient ID: Diamond Marshall, female    DOB: 1970/09/05, 51 y.o.   MRN: [id number]  HPI: Diamond Marshall is a 51 y.o. female presenting on 12/16/2021 for Insect Bite (Wasp sting on right forearm yesterday at 3pm. Hot, itchying, red and swelling )  Patient is accompanied by her husband, who she is okay with being present.  Patient reports she was stung by a wasp yesterday at 3 PM on her right forearm.  States it was stuck and she had to knock it off her arm and then pull out the stinger.  She immediately started swelling around the site.  She applied ice and took some Benadryl. The swelling has gotten worse and is accompanied by itching, redness and warmth. Her mother is highly allergic to wasps, but she has never had any issues in the past.    ROS: Negative unless specifically indicated above in HPI.   Relevant past medical history reviewed and updated as indicated.   Allergies and medications reviewed and updated.   Current Outpatient Medications:    cyanocobalamin (,VITAMIN B-12,) 1000 MCG/ML injection, INJECT 1 ML INTRAMUSCULARLY EVERY  15 DAYS, Disp: 6 mL, Rfl: 1   cyclobenzaprine (FLEXERIL) 10 MG tablet, Take 1 tablet (10 mg total) by mouth 3 (three) times daily as needed for muscle spasms., Disp: 30 tablet, Rfl: 1   diclofenac (VOLTAREN) 75 MG EC tablet, Take 1 tablet (75 mg total) by mouth 2 (two) times daily as needed. (NEEDS TO BE SEEN BEFORE NEXT REFILL), Disp: 60 tablet, Rfl: 0   Dulaglutide (TRULICITY) 0.16 WF/0.9NA SOPN, Inject 0.75 mg into the skin once a week., Disp: 6 each, Rfl: 3   escitalopram (LEXAPRO) 20 MG tablet, Take 1 tablet (20 mg total) by mouth daily., Disp: 90 tablet, Rfl: 3   LORazepam (ATIVAN) 0.5 MG tablet, Take 1 tablet (0.5 mg total) by mouth 2 (two) times daily as needed for anxiety., Disp: 30 tablet, Rfl: 5   pantoprazole (PROTONIX) 40 MG tablet, Take 2 tablets (80 mg total) by mouth daily., Disp: 180 tablet, Rfl: 3   levothyroxine (SYNTHROID) 50 MCG tablet, Take 1 tablet (50 mcg total) by mouth daily. (Patient not taking: Reported on 12/16/2021), Disp: 90 tablet, Rfl: 3  Allergies  Allergen Reactions   Phentermine     Became suicidal on the medication   Sulfa Antibiotics    Topiramate Other (See Comments)    dizziness   Septra [Sulfamethoxazole-Trimethoprim] Rash    Objective:   BP 131/81   Pulse 87   Temp 98.4 F (36.9 C) (Temporal)   Ht '5\' 2"'$  (1.575 m)   Wt (!) 329 lb (149.2 kg)  SpO2 94%   BMI 60.17 kg/m    Physical Exam Vitals reviewed.  Constitutional:      General: She is not in acute distress.    Appearance: Normal appearance. She is not ill-appearing, toxic-appearing or diaphoretic.  HENT:     Head: Normocephalic and atraumatic.  Eyes:     General: No scleral icterus.       Right eye: No discharge.        Left eye: No discharge.     Conjunctiva/sclera: Conjunctivae normal.  Cardiovascular:     Rate and Rhythm: Normal rate.  Pulmonary:     Effort: Pulmonary effort is normal. No respiratory distress.  Musculoskeletal:        General: Normal range of motion.      Cervical back: Normal range of motion.  Skin:    General: Skin is warm and dry.     Capillary Refill: Capillary refill takes less than 2 seconds.     Comments: Right forearm site of wasp sting with erythema, warmth, and swelling measuring 19 x 8 cm.  Neurological:     General: No focal deficit present.     Mental Status: She is alert and oriented to person, place, and time. Mental status is at baseline.  Psychiatric:        Mood and Affect: Mood normal.        Behavior: Behavior normal.        Thought Content: Thought content normal.        Judgment: Judgment normal.

## 2021-12-20 ENCOUNTER — Ambulatory Visit: Payer: Commercial Managed Care - PPO | Admitting: Family Medicine

## 2021-12-28 ENCOUNTER — Encounter (INDEPENDENT_AMBULATORY_CARE_PROVIDER_SITE_OTHER): Payer: Self-pay

## 2022-01-30 ENCOUNTER — Other Ambulatory Visit: Payer: Self-pay | Admitting: Family Medicine

## 2022-03-06 ENCOUNTER — Encounter: Payer: Self-pay | Admitting: Family Medicine

## 2022-03-06 ENCOUNTER — Other Ambulatory Visit: Payer: Self-pay | Admitting: Family Medicine

## 2022-03-06 MED ORDER — TRULICITY 1.5 MG/0.5ML ~~LOC~~ SOAJ: SUBCUTANEOUS | 3 refills | Status: DC

## 2022-03-21 ENCOUNTER — Other Ambulatory Visit: Payer: Self-pay | Admitting: Family Medicine

## 2022-03-22 ENCOUNTER — Encounter: Payer: Self-pay | Admitting: Family Medicine

## 2022-03-22 NOTE — Telephone Encounter (Signed)
Stacks NTBS 30 days given 01/30/22

## 2022-03-22 NOTE — Telephone Encounter (Signed)
Letter sent.

## 2022-03-24 NOTE — Patient Instructions (Signed)
Our records indicate that you are due for your annual mammogram/breast imaging. While there is no way to prevent breast cancer, early detection provides the best opportunity for curing it. For women over the age of 40, the American Cancer Society recommends a yearly clinical breast exam and a yearly mammogram. These practices have saved thousands of lives. We need your help to ensure that you are receiving optimal medical care. Please call the imaging location that has done you previous mammograms. Please remember to list us as your primary care. This helps make sure we receive a report and can update your chart.  Below is the contact information for several local breast imaging centers. You may call the location that works best for you, and they will be happy to assistance in making you an appointment. You do not need an order for a regular screening mammogram. However, if you are having any problems or concerns with you breast area, please let your primary care provider know, and appropriate orders will be placed. Please let our office know if you have any questions or concerns. Or if you need information for another imaging center not on this list or outside of the area. We are commented to working with you on your health care journey.   The mobile unit/bus ([facility]) - they come twice a month to our location.  These appointments can be made through our office or by call The Breast Center  [facility]  [address] [place], [place] [address] Phone [phone]  [facility] Radiology Department  [address]  [place], [place] [address] [phone]  [facility]  [address] [place], [place] [address] [phone]  [facility] [place]  [address] [place] [address] [phone]  [facility]  [address], [address] [place] [address] [phone]  [facility] [place]  [address] [place], [place] [address] [phone]  [facility] [address] [place], [place] [address] [phone]  [facility] [address]  [address] [place], [place] [address] [phone]  [facility] [address] [place] [address] [phone]     

## 2022-03-27 ENCOUNTER — Telehealth (INDEPENDENT_AMBULATORY_CARE_PROVIDER_SITE_OTHER): Payer: Commercial Managed Care - PPO | Admitting: Family Medicine

## 2022-03-27 ENCOUNTER — Encounter: Payer: Self-pay | Admitting: Family Medicine

## 2022-03-27 DIAGNOSIS — M25561 Pain in right knee: Secondary | ICD-10-CM | POA: Diagnosis not present

## 2022-03-27 DIAGNOSIS — I10 Essential (primary) hypertension: Secondary | ICD-10-CM

## 2022-03-27 DIAGNOSIS — Z6841 Body Mass Index (BMI) 40.0 and over, adult: Secondary | ICD-10-CM

## 2022-03-27 DIAGNOSIS — G8929 Other chronic pain: Secondary | ICD-10-CM | POA: Diagnosis not present

## 2022-03-27 MED ORDER — DICLOFENAC SODIUM 75 MG PO TBEC
75.0000 mg | DELAYED_RELEASE_TABLET | Freq: Two times a day (BID) | ORAL | 0 refills | Status: DC | PRN
Start: 2022-03-27 — End: 2022-05-23

## 2022-03-27 NOTE — Progress Notes (Signed)
Subjective:    Patient ID: Diamond Marshall, female    DOB: 04-12-1971, 51 y.o.   MRN: [id number]   HPI: Diamond Marshall is a 51 y.o. female presenting for Trulicity and diclofenac refills. Able to say no to sweets.  Down 5 lb in the first week since increasing dosage. Two weeks ago had episode feeling terrible. Had pain in jaw and left arm. Occurred at time of excessive stress. Started taking lisinopril 5 mg. Now 128/80      12/16/2021    8:46 AM 07/21/2021    3:07 PM 07/21/2021    3:06 PM 01/25/2021    3:09 PM 10/12/2020    8:10 AM  Depression screen PHQ 2/9  Decreased Interest 0 0 0 0 0  Down, Depressed, Hopeless 0 0 0 0 0  PHQ - 2 Score 0 0 0 0 0  Altered sleeping 0 0     Tired, decreased energy 0 1     Change in appetite 0 1     Feeling bad or failure about yourself  0 0     Trouble concentrating 0 0     Moving slowly or fidgety/restless 0 0     Suicidal thoughts 0 0     PHQ-9 Score 0 2     Difficult doing work/chores Not difficult at all Not difficult at all        Relevant past medical, surgical, family and social history reviewed and updated as indicated.  Interim medical history since our last visit reviewed. Allergies and medications reviewed and updated.  ROS:  Review of Systems  Constitutional: Negative.   HENT: Negative.    Eyes:  Negative for visual disturbance.  Respiratory:  Negative for shortness of breath.   Cardiovascular:  Positive for chest pain.  Gastrointestinal:  Negative for abdominal pain.  Musculoskeletal:  Positive for arthralgias.     Social History   Tobacco Use  Smoking Status Never  Smokeless Tobacco Never       Objective:     Wt Readings from Last 3 Encounters:  12/16/21 (!) 329 lb (149.2 kg)  07/21/21 (!) 319 lb 12.8 oz (145.1 kg)  01/25/21 (!) 322 lb 6.4 oz (146.2 kg)     Exam deferred. Pt. Harboring due to COVID 19. Phone visit performed.   Assessment & Plan:   1. Chronic pain of right knee   2. Morbid obesity ([place])   3.  Essential hypertension     Meds ordered this encounter  Medications   diclofenac (VOLTAREN) 75 MG EC tablet    Sig: Take 1 tablet (75 mg total) by mouth 2 (two) times daily as needed. Ntbs for further refills    Dispense:  60 tablet    Refill:  0    Orders Placed This Encounter  Procedures   CMP14+EGFR    Order Specific Question:   Has the patient fasted?    Answer:   Yes    Order Specific Question:   Release to patient    Answer:   Immediate      Diagnoses and all orders for this visit:  Chronic pain of right knee -     CMP14+EGFR  Morbid obesity ([place]) -     CMP14+EGFR  Essential hypertension -     CMP14+EGFR  Other orders -     diclofenac (VOLTAREN) 75 MG EC tablet; Take 1 tablet (75 mg total) by mouth 2 (two) times daily as needed. Ntbs for further refills  Virtual Visit via telephone Note  I discussed the limitations, risks, security and privacy concerns of performing an evaluation and management service by telephone and the availability of in person appointments. The patient was identified with two identifiers. Pt.expressed understanding and agreed to proceed. Pt. Is at home. Dr. Livia Snellen is in his office.  Follow Up Instructions:   I discussed the assessment and treatment plan with the patient. The patient was provided an opportunity to ask questions and all were answered. The patient agreed with the plan and demonstrated an understanding of the instructions.   The patient was advised to call back or seek an in-person evaluation if the symptoms worsen or if the condition fails to improve as anticipated.   Total minutes including chart review and phone contact time: 16   Follow up plan: No follow-ups on file.  Claretta Fraise, MD [place]

## 2022-03-28 ENCOUNTER — Other Ambulatory Visit: Payer: Commercial Managed Care - PPO

## 2022-03-28 LAB — CMP14+EGFR
ALT: 24 IU/L (ref 0–32)
AST: 27 IU/L (ref 0–40)
Albumin/Globulin Ratio: 1.4 (ref 1.2–2.2)
Albumin: 4.2 g/dL (ref 3.8–4.9)
Alkaline Phosphatase: 125 IU/L — ABNORMAL HIGH (ref 44–121)
BUN/Creatinine Ratio: 15 (ref 9–23)
BUN: 14 mg/dL (ref 6–24)
Bilirubin Total: 0.2 mg/dL (ref 0.0–1.2)
CO2: 22 mmol/L (ref 20–29)
Calcium: 9.4 mg/dL (ref 8.7–10.2)
Chloride: 102 mmol/L (ref 96–106)
Creatinine, Ser: 0.92 mg/dL (ref 0.57–1.00)
Globulin, Total: 2.9 g/dL (ref 1.5–4.5)
Glucose: 90 mg/dL (ref 70–99)
Potassium: 4.6 mmol/L (ref 3.5–5.2)
Sodium: 139 mmol/L (ref 134–144)
Total Protein: 7.1 g/dL (ref 6.0–8.5)
eGFR: 75 mL/min/{1.73_m2} (ref >59)

## 2022-03-30 ENCOUNTER — Telehealth: Payer: Self-pay

## 2022-03-30 NOTE — Progress Notes (Signed)
Hello [place],  Your lab result is normal and/or stable.Some minor variations that are not significant are commonly marked abnormal, but do not represent any medical problem for you.  Best regards, Claretta Fraise, M.D.

## 2022-03-30 NOTE — Telephone Encounter (Signed)
Transition Care Management Follow-up Telephone Call Date of discharge and from where: 03/29/2022 [facility]  How have you been since you were released from the hospital? Patient states that she is doing okay  Any questions or concerns? No  Items Reviewed: Did the pt receive and understand the discharge instructions provided? Yes  Medications obtained and verified? Yes  Other? Yes  Any new allergies since your discharge? No  Dietary orders reviewed? Yes Do you have support at home? Yes   Home Care and Equipment/Supplies: Were home health services ordered? not applicable If so, what is the name of the agency? na  Has the agency set up a time to come to the patient's home? not applicable Were any new equipment or medical supplies ordered?  No What is the name of the medical supply agency? na Were you able to get the supplies/equipment? not applicable Do you have any questions related to the use of the equipment or supplies? No  Functional Questionnaire: (I = Independent and D = Dependent) ADLs: I  Bathing/Dressing- I  Meal Prep- I  Eating- I  Maintaining continence- I  Transferring/Ambulation- I  Managing Meds- I  Follow up appointments reviewed:  PCP Hospital f/u appt confirmed?  Patient declined will follow up as needed    [facility] f/u appt confirmed?  NA Are transportation arrangements needed? No  If their condition worsens, is the pt aware to call PCP or go to the Emergency Dept.? Yes Was the patient provided with contact information for the PCP's office or ED? Yes Was to pt encouraged to call back with questions or concerns? Yes

## 2022-03-31 ENCOUNTER — Ambulatory Visit (INDEPENDENT_AMBULATORY_CARE_PROVIDER_SITE_OTHER): Payer: Commercial Managed Care - PPO | Admitting: Family

## 2022-03-31 ENCOUNTER — Encounter: Payer: Self-pay | Admitting: Family

## 2022-03-31 DIAGNOSIS — J209 Acute bronchitis, unspecified: Secondary | ICD-10-CM | POA: Diagnosis not present

## 2022-03-31 MED ORDER — PREDNISONE 10 MG (21) PO TBPK: ORAL_TABLET | ORAL | 0 refills | Status: DC

## 2022-03-31 MED ORDER — BENZONATATE 200 MG PO CAPS: 200.0000 mg | ORAL_CAPSULE | Freq: Three times a day (TID) | ORAL | 1 refills | Status: DC | PRN

## 2022-03-31 NOTE — Patient Instructions (Signed)
Bronchospasm, Adult  Bronchospasm is a tightening of the smooth muscle that wraps around the small airways in the lungs. When the muscle tightens, the small airways narrow. Narrowed airways limit the air you breathe in or out of your lungs. Inflammation (swelling) and more mucus (sputum) than usual can further irritate the airways. This can make it very hard to breathe. Bronchospasm can happen suddenly or over a period of time. What are the causes? Common causes of this condition include: An infection, such as a cold or sinus drainage. Exercise. Strong odors from aerosol sprays, and fumes from perfume, candles, and household cleaners. Cold air. Stress or strong emotions such as crying or laughing. What increases the risk? The following factors may make you more likely to develop this condition: Having asthma. Smoking or being around someone who smokes (secondhand smoke). Seasonal allergies, such as pollen or mold. Allergic reaction (anaphylaxis) to food, medicine, or insect bites or stings. What are the signs or symptoms? Symptoms of this condition include: Making a high-pitched whistling sound when you breathe, most often when you breathe out (wheezing). Coughing. Chest tightness. Shortness of breath. Decreased ability to exercise. Noisy breathing or a high-pitched cough. How is this diagnosed? This condition may be diagnosed based on your medical history and a physical exam. Your health care provider may also perform tests, including: A chest X-ray. Lung function tests. How is this treated? This condition may be treated by: Using inhaled medicines. These open up (relax) the airways and help you breathe. They can be taken with a metered dose inhaler or a nebulizer device. Taking corticosteroid medicines. These may be given to reduce inflammation and swelling. Removing the irritant or trigger that started the bronchospasm. Follow these instructions at home: Medicines Take  over-the-counter and prescription medicines only as told by your health care provider. If you need to use an inhaler or nebulizer to take your medicine, ask your health care provider how to use it correctly. You may be given a spacer to use with your inhaler. This makes it easier to get the medicine from the inhaler into your lungs. Lifestyle Do not use any products that contain nicotine or tobacco. These products include cigarettes, chewing tobacco, and vaping devices, such as e-cigarettes. If you need help quitting, ask your health care provider. Keep track of things that trigger your bronchospasm. Avoid these if possible. When pollen, air pollution, or humidity levels are bad, keep windows closed and use an air conditioner or go to places that have air conditioning. Find ways to manage stress and your emotions, such as mindfulness, relaxation, or breathing exercises. Activity Some people have bronchospasm when they exercise. This is called exercise-induced bronchoconstriction (EIB). If you have this problem, talk with your health care provider about how to manage EIB. Some tips include: Using your fast-acting inhaler before exercise. Exercising indoors if it is very cold or humid, or if the pollen and mold counts are high. Warming up and cool down before and after exercise. Stopping exercising right away if your symptoms start or get worse. General instructions If you have asthma, make sure you have an asthma action plan. Stay up to date on your immunizations. Keep all follow-up visits. This is important. Get help right away if: You have trouble breathing. Your wheezing and coughing do not get better after taking your medicine. You have chest pain. You have trouble speaking more than one-word sentences. These symptoms may be an emergency. Get help right away. Call 911. Do not wait to   see if the symptoms will go away. Do not drive yourself to the hospital. Summary Bronchospasm is a  tightening of the smooth muscle that wraps around the small airways in the lungs. Some people have bronchospasm when they exercise. This is called exercise-induced bronchoconstriction (EIB). If you have this problem, talk with your health care provider about how to manage EIB. Do not use any products that contain nicotine or tobacco. These products include cigarettes, chewing tobacco, and vaping devices, such as e-cigarettes. If you need help quitting, ask your health care provider. Get help right away if your wheezing and coughing do not get better after taking your medicine. This information is not intended to replace advice given to you by your health care provider. Make sure you discuss any questions you have with your health care provider. Document Revised: 11/29/2020 Document Reviewed: 11/29/2020 Elsevier Patient Education  2023 [employer] Inc[employer]  

## 2022-03-31 NOTE — Progress Notes (Signed)
Virtual Visit  Note Due to COVID-19 pandemic this visit was conducted virtually. This visit type was conducted due to national recommendations for restrictions regarding the COVID-19 Pandemic (e.g. social distancing, sheltering in place) in an effort to limit this patient's exposure and mitigate transmission in our community. All issues noted in this document were discussed and addressed.  A physical exam was not performed with this format.  I connected with Diamond Marshall on 03/31/22 at 9:35 AM by telephone and verified that I am speaking with the correct person using two identifiers. Diamond Marshall is currently located at home and her husband is currently with her during visit. The provider, Evelina Dun, FNP is located in their office at time of visit.  I discussed the limitations, risks, security and privacy concerns of performing an evaluation and management service by telephone and the availability of in person appointments. I also discussed with the patient that there may be a patient responsible charge related to this service. The patient expressed understanding and agreed to proceed.  Diamond Marshall, Diamond Marshall are scheduled for a virtual visit with your provider today.    Just as we do with appointments in the office, we must obtain your consent to participate.  Your consent will be active for this visit and any virtual visit you may have with one of our providers in the next 365 days.    If you have a MyChart account, I can also send a copy of this consent to you electronically.  All virtual visits are billed to your insurance company just like a traditional visit in the office.  As this is a virtual visit, video technology does not allow for your provider to perform a traditional examination.  This may limit your provider's ability to fully assess your condition.  If your provider identifies any concerns that need to be evaluated in person or the need to arrange testing such as labs, EKG, etc, we will  make arrangements to do so.    Although advances in technology are sophisticated, we cannot ensure that it will always work on either your end or our end.  If the connection with a video visit is poor, we may have to switch to a telephone visit.  With either a video or telephone visit, we are not always able to ensure that we have a secure connection.   I need to obtain your verbal consent now.   Are you willing to proceed with your visit today?   Diamond Marshall has provided verbal consent on 03/31/2022 for a virtual visit (video or telephone).   Sahithi Ordoyne, [place] 03/31/2022  9:30 AM   History and Present Illness:  Cough This is a new problem. The current episode started in the past 7 days. The problem has been waxing and waning. The problem occurs every few minutes. The cough is Non-productive. Associated symptoms include headaches, nasal congestion, postnasal drip, shortness of breath and wheezing. Pertinent negatives include no chills, ear congestion, ear pain, fever or myalgias. She has tried rest and OTC cough suppressant for the symptoms.      Review of Systems  Constitutional:  Negative for chills and fever.  HENT:  Positive for postnasal drip. Negative for ear pain.   Respiratory:  Positive for cough, shortness of breath and wheezing.   Musculoskeletal:  Negative for myalgias.  Neurological:  Positive for headaches.     Observations/Objective: No SOB or distress noted, wheezing   Assessment and Plan: 1. Acute bronchitis, unspecified  organism - Take meds as prescribed - Use a cool mist humidifier  -Use saline nose sprays frequently -Force fluids -For any cough or congestion  Use plain Mucinex- regular strength or max strength is fine -For fever or aces or pains- take tylenol or ibuprofen. -Throat lozenges if help -Follow up if symptoms worsen or do not improve  - predniSONE (STERAPRED UNI-PAK 21 TAB) 10 MG (21) TBPK tablet; Use as directed  Dispense: 21 tablet;  Refill: 0 - benzonatate (TESSALON) 200 MG capsule; Take 1 capsule (200 mg total) by mouth 3 (three) times daily as needed.  Dispense: 30 capsule; Refill: 1     I discussed the assessment and treatment plan with the patient. The patient was provided an opportunity to ask questions and all were answered. The patient agreed with the plan and demonstrated an understanding of the instructions.   The patient was advised to call back or seek an in-person evaluation if the symptoms worsen or if the condition fails to improve as anticipated.  The above assessment and management plan was discussed with the patient. The patient verbalized understanding of and has agreed to the management plan. Patient is aware to call the clinic if symptoms persist or worsen. Patient is aware when to return to the clinic for a follow-up visit. Patient educated on when it is appropriate to go to the emergency department.   Time call ended:  9:46 AM  I provided 11 minutes of  non face-to-face time during this encounter.    Evelina Dun, FNP

## 2022-04-24 ENCOUNTER — Other Ambulatory Visit: Payer: Self-pay | Admitting: Family Medicine

## 2022-04-24 DIAGNOSIS — Z1231 Encounter for screening mammogram for malignant neoplasm of breast: Secondary | ICD-10-CM

## 2022-04-26 ENCOUNTER — Ambulatory Visit
Admission: RE | Admit: 2022-04-26 | Discharge: 2022-04-26 | Disposition: A | Discharge status: Home / Self Care | Payer: Commercial Managed Care - PPO | Source: Ambulatory Visit | Attending: Family Medicine | Admitting: Family Medicine

## 2022-04-26 ENCOUNTER — Other Ambulatory Visit: Payer: Self-pay | Admitting: Family Medicine

## 2022-04-26 DIAGNOSIS — N6320 Unspecified lump in the left breast, unspecified quadrant: Secondary | ICD-10-CM

## 2022-04-26 DIAGNOSIS — N63 Unspecified lump in unspecified breast: Secondary | ICD-10-CM

## 2022-04-26 DIAGNOSIS — Z1231 Encounter for screening mammogram for malignant neoplasm of breast: Secondary | ICD-10-CM

## 2022-04-28 ENCOUNTER — Encounter: Payer: Self-pay | Admitting: Family Medicine

## 2022-04-28 ENCOUNTER — Ambulatory Visit (INDEPENDENT_AMBULATORY_CARE_PROVIDER_SITE_OTHER): Payer: Commercial Managed Care - PPO | Admitting: Family Medicine

## 2022-04-28 VITALS — BP 139/89 | HR 102 | Temp 98.9°F | Ht 62.0 in | Wt 336.0 lb

## 2022-04-28 DIAGNOSIS — R0981 Nasal congestion: Secondary | ICD-10-CM

## 2022-04-28 DIAGNOSIS — R509 Fever, unspecified: Secondary | ICD-10-CM | POA: Diagnosis not present

## 2022-04-28 DIAGNOSIS — R051 Acute cough: Secondary | ICD-10-CM | POA: Diagnosis not present

## 2022-04-28 LAB — VERITOR FLU A/B WAIVED
Influenza A: NEGATIVE
Influenza B: NEGATIVE

## 2022-04-28 MED ORDER — ALBUTEROL SULFATE HFA 108 (90 BASE) MCG/ACT IN AERS: 2.0000 | INHALATION_SPRAY | Freq: Four times a day (QID) | RESPIRATORY_TRACT | 0 refills | Status: DC | PRN

## 2022-04-28 MED ORDER — MOLNUPIRAVIR EUA 200MG CAPSULE: 4.0000 | ORAL_CAPSULE | Freq: Two times a day (BID) | ORAL | 0 refills | Status: AC

## 2022-04-28 NOTE — Progress Notes (Signed)
BP 139/89   Pulse (!) 102   Temp 98.9 F (37.2 C)   Ht '5\' 2"'$  (1.575 m)   Wt (!) 336 lb (152.4 kg)   SpO2 95%   BMI 61.46 kg/m    Subjective:   Patient ID: Diamond Marshall, female    DOB: 08-Jan-1971, 51 y.o.   MRN: [id number]  HPI: Diamond Marshall is a 51 y.o. female presenting on 04/28/2022 for URI   HPI Cough and congestion and fevers and chills Patient is coming in today with cough and congestion and fevers and chills been going on since yesterday.  She says she works for [employer] and her husband is a Education officer, museum and so they are exposed to a lot of different things.  She denies any shortness of breath or wheezing but she has been having coughing to the point where it will cause her to spit up some.  Her fever was 101.6 yesterday.  Relevant past medical, surgical, family and social history reviewed and updated as indicated. Interim medical history since our last visit reviewed. Allergies and medications reviewed and updated.  Review of Systems  Constitutional:  Positive for chills and fever.  HENT:  Positive for postnasal drip, rhinorrhea and sinus pressure. Negative for congestion, ear discharge, ear pain, sneezing and sore throat.   Eyes:  Negative for pain, redness and visual disturbance.  Respiratory:  Positive for cough. Negative for chest tightness and shortness of breath.   Cardiovascular:  Negative for chest pain and leg swelling.  Genitourinary:  Negative for difficulty urinating and dysuria.  Musculoskeletal:  Negative for back pain and gait problem.  Skin:  Negative for rash.  Neurological:  Negative for light-headedness and headaches.  Psychiatric/Behavioral:  Negative for agitation and behavioral problems.   All other systems reviewed and are negative.   Per HPI unless specifically indicated above   Allergies as of 04/28/2022       Reactions   Phentermine    Became suicidal on the medication   Sulfa Antibiotics    Topiramate Other (See Comments)   dizziness    Septra [sulfamethoxazole-trimethoprim] Rash        Medication List        Accurate as of April 28, 2022  3:11 PM. If you have any questions, ask your nurse or doctor.          albuterol 108 (90 Base) MCG/ACT inhaler Commonly known as: VENTOLIN HFA Inhale 2 puffs into the lungs every 6 (six) hours as needed for wheezing or shortness of breath. Started by: Fransisca Kaufmann Bridgid Printz, MD   benzonatate 200 MG capsule Commonly known as: TESSALON Take 1 capsule (200 mg total) by mouth 3 (three) times daily as needed.   cyanocobalamin 1000 MCG/ML injection Commonly known as: VITAMIN B12 INJECT 1 ML INTRAMUSCULARLY EVERY 15 DAYS   cyclobenzaprine 10 MG tablet Commonly known as: FLEXERIL Take 1 tablet (10 mg total) by mouth 3 (three) times daily as needed for muscle spasms.   diclofenac 75 MG EC tablet Commonly known as: VOLTAREN Take 1 tablet (75 mg total) by mouth 2 (two) times daily as needed. Ntbs for further refills   EPINEPHrine 0.3 mg/0.3 mL Soaj injection Commonly known as: EPI-PEN Inject 0.3 mg into the muscle as needed for anaphylaxis.   escitalopram 20 MG tablet Commonly known as: LEXAPRO Take 1 tablet (20 mg total) by mouth daily.   levothyroxine 50 MCG tablet Commonly known as: SYNTHROID Take 1 tablet (50 mcg total) by mouth  daily.   LORazepam 0.5 MG tablet Commonly known as: ATIVAN Take 1 tablet (0.5 mg total) by mouth 2 (two) times daily as needed for anxiety.   molnupiravir EUA 200 mg Caps capsule Commonly known as: LAGEVRIO Take 4 capsules (800 mg total) by mouth 2 (two) times daily for 5 days. Started by: Fransisca Kaufmann Tiearra Colwell, MD   pantoprazole 40 MG tablet Commonly known as: PROTONIX Take 2 tablets (80 mg total) by mouth daily.   predniSONE 10 MG (21) Tbpk tablet Commonly known as: STERAPRED UNI-PAK 21 TAB Use as directed   Trulicity 1.5 MG/8.6PY Sopn Generic drug: Dulaglutide Inject content of one pen under the skin weekly          Objective:   BP 139/89   Pulse (!) 102   Temp 98.9 F (37.2 C)   Ht '5\' 2"'$  (1.575 m)   Wt (!) 336 lb (152.4 kg)   SpO2 95%   BMI 61.46 kg/m   Wt Readings from Last 3 Encounters:  04/28/22 (!) 336 lb (152.4 kg)  12/16/21 (!) 329 lb (149.2 kg)  07/21/21 (!) 319 lb 12.8 oz (145.1 kg)    Physical Exam Vitals reviewed.  Constitutional:      General: She is not in acute distress.    Appearance: She is well-developed. She is not diaphoretic.  HENT:     Right Ear: Tympanic membrane, ear canal and external ear normal.     Left Ear: Tympanic membrane, ear canal and external ear normal.     Nose: Mucosal edema and rhinorrhea present.     Right Sinus: No maxillary sinus tenderness or frontal sinus tenderness.     Left Sinus: No maxillary sinus tenderness or frontal sinus tenderness.     Mouth/Throat:     Pharynx: Uvula midline. Posterior oropharyngeal erythema present. No oropharyngeal exudate.     Tonsils: No tonsillar abscesses.  Eyes:     Conjunctiva/sclera: Conjunctivae normal.  Cardiovascular:     Rate and Rhythm: Normal rate and regular rhythm.     Heart sounds: Normal heart sounds. No murmur heard. Pulmonary:     Effort: Pulmonary effort is normal. No respiratory distress.     Breath sounds: Normal breath sounds. No wheezing, rhonchi or rales.  Chest:     Chest wall: No tenderness.  Musculoskeletal:        General: No tenderness. Normal range of motion.  Skin:    General: Skin is warm and dry.     Findings: No rash.  Neurological:     Mental Status: She is alert and oriented to person, place, and time.     Coordination: Coordination normal.  Psychiatric:        Behavior: Behavior normal.     Rapid flu negative  Assessment & Plan:   Problem List Items Addressed This Visit   None Visit Diagnoses     Nasal congestion    -  Primary   Relevant Medications   molnupiravir EUA (LAGEVRIO) 200 mg CAPS capsule   albuterol (VENTOLIN HFA) 108 (90 Base) MCG/ACT  inhaler   Other Relevant Orders   Novel Coronavirus, NAA ([facility])   Veritor Flu A/B Waived   Fever, unspecified fever cause       Relevant Medications   molnupiravir EUA (LAGEVRIO) 200 mg CAPS capsule   albuterol (VENTOLIN HFA) 108 (90 Base) MCG/ACT inhaler   Other Relevant Orders   Novel Coronavirus, NAA ([facility])   Veritor Flu A/B Waived   Acute cough  Relevant Medications   molnupiravir EUA (LAGEVRIO) 200 mg CAPS capsule   albuterol (VENTOLIN HFA) 108 (90 Base) MCG/ACT inhaler   Other Relevant Orders   Novel Coronavirus, NAA ([facility])   Veritor Flu A/B Waived     Recommended Flonase and Mucinex and gave inhaler for chest congestion  Gave COVID medication in case it comes back positive for the results over the weekend.  Follow up plan: Return if symptoms worsen or fail to improve.  Counseling provided for all of the vaccine components Orders Placed This Encounter  Procedures   Novel Coronavirus, NAA ([facility])   Veritor Flu A/B [place] Taran Haynesworth, MD [facility] 04/28/2022, 3:11 PM

## 2022-04-29 LAB — NOVEL CORONAVIRUS, NAA: SARS-CoV-2, NAA: DETECTED — AB

## 2022-04-30 IMAGING — DX DG KNEE 1-2V*R*
2 series · 2 of 2 positions shown · non-contrast
Comparison: 04/24/2018.

CLINICAL DATA: Knee pain.

EXAM:
RIGHT KNEE - 1-2 VIEW

[knee ap]
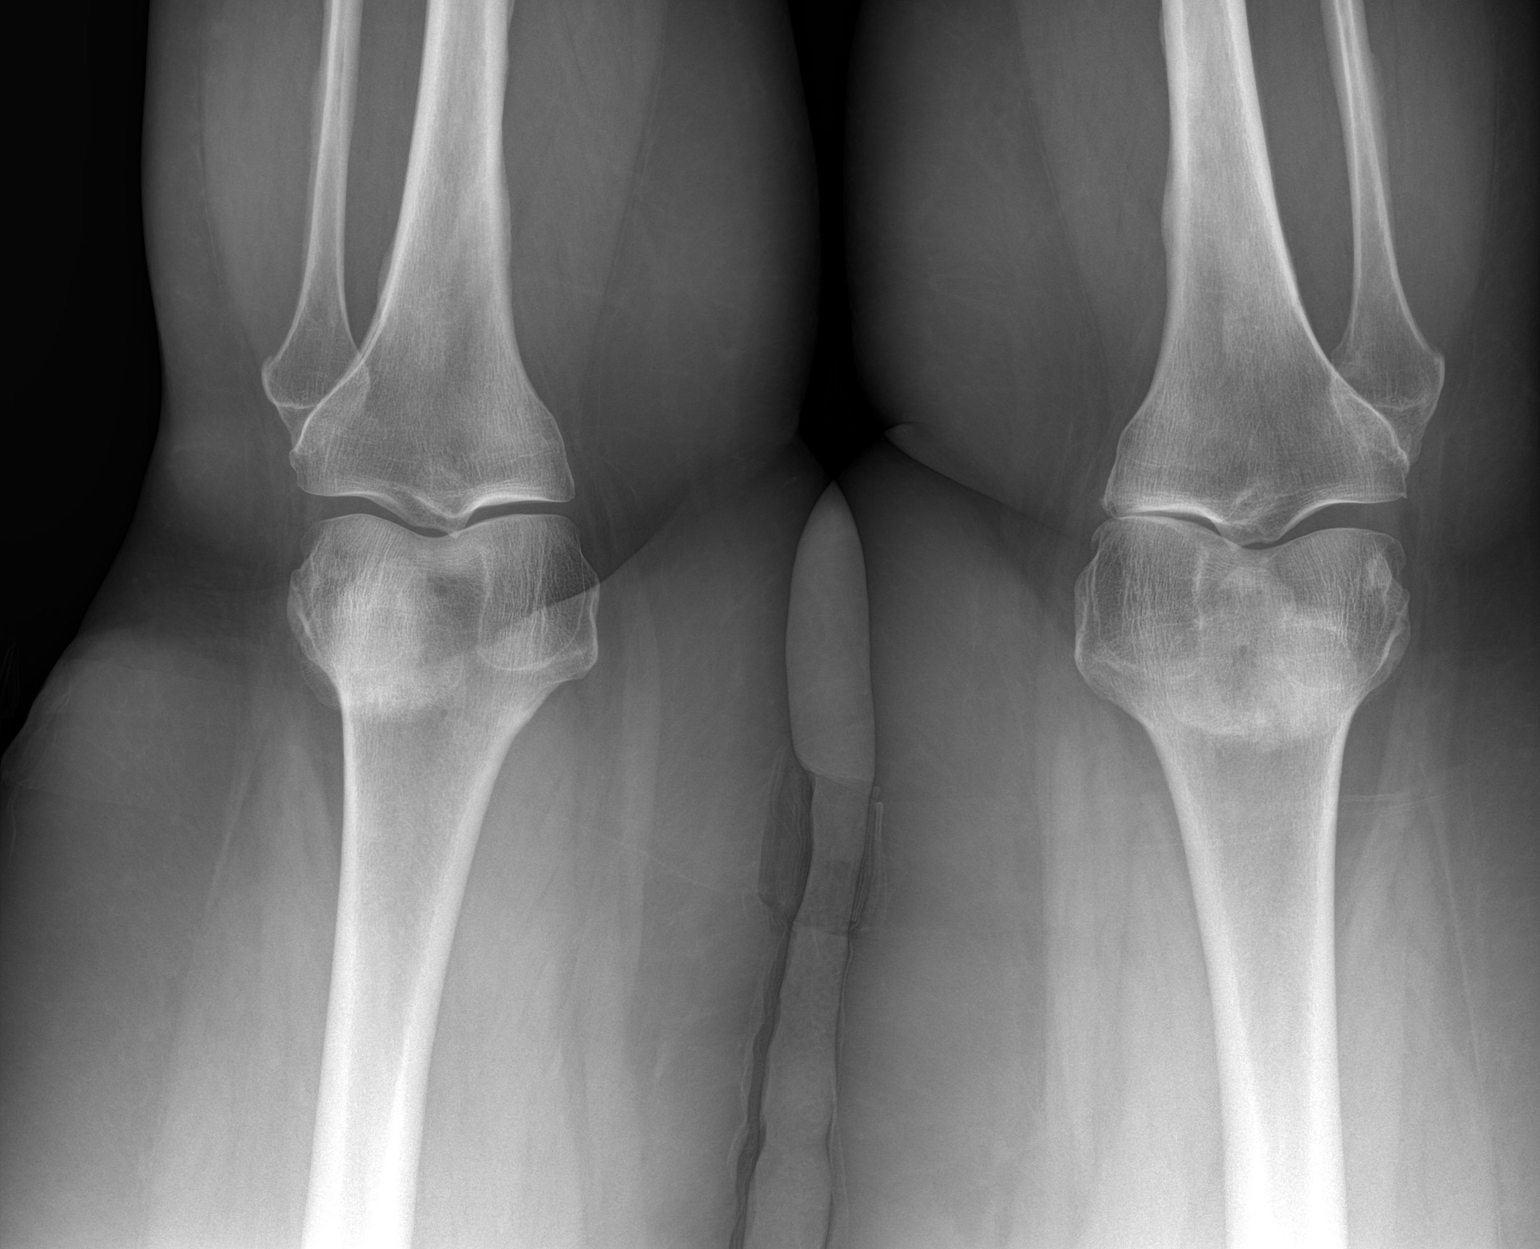

[knee lat]
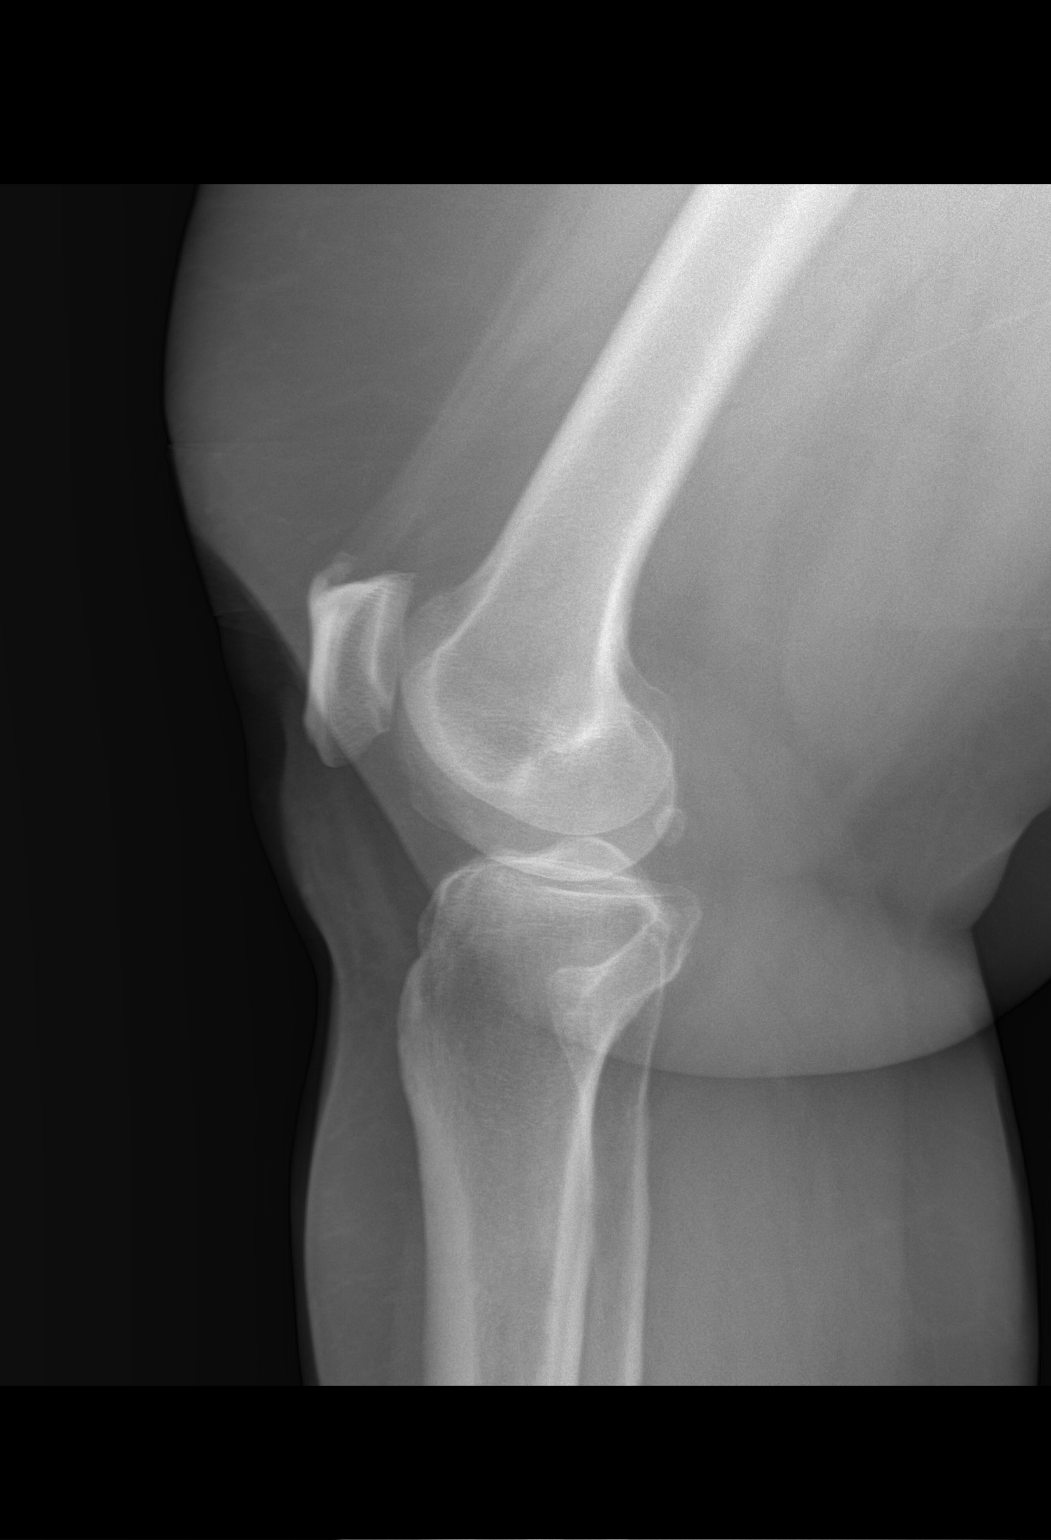

[2 of 2 positions shown; findings below may reference images not displayed]

FINDINGS: AP images are inverted. Diffuse degenerative change. No acute bony
or joint abnormality. No evidence of fracture dislocation. Scratched
it
IMPRESSION: Diffuse degenerative change.  No acute abnormality.

## 2022-05-02 ENCOUNTER — Other Ambulatory Visit: Payer: Commercial Managed Care - PPO

## 2022-05-23 ENCOUNTER — Other Ambulatory Visit: Payer: Self-pay | Admitting: Family Medicine

## 2022-05-29 ENCOUNTER — Telehealth: Payer: Self-pay | Admitting: Family Medicine

## 2022-05-29 ENCOUNTER — Encounter: Payer: Self-pay | Admitting: Family Medicine

## 2022-05-31 NOTE — Telephone Encounter (Signed)
Patient has already gotten appt moved up

## 2022-06-06 ENCOUNTER — Other Ambulatory Visit: Payer: Self-pay | Admitting: Family Medicine

## 2022-06-06 ENCOUNTER — Ambulatory Visit
Admission: RE | Admit: 2022-06-06 | Discharge: 2022-06-06 | Disposition: A | Discharge status: Home / Self Care | Payer: Commercial Managed Care - PPO | Source: Ambulatory Visit | Attending: Family Medicine | Admitting: Family Medicine

## 2022-06-06 DIAGNOSIS — N6320 Unspecified lump in the left breast, unspecified quadrant: Secondary | ICD-10-CM

## 2022-06-06 DIAGNOSIS — R599 Enlarged lymph nodes, unspecified: Secondary | ICD-10-CM

## 2022-06-06 DIAGNOSIS — N63 Unspecified lump in unspecified breast: Secondary | ICD-10-CM

## 2022-06-09 ENCOUNTER — Ambulatory Visit
Admission: RE | Admit: 2022-06-09 | Discharge: 2022-06-09 | Disposition: A | Discharge status: Home / Self Care | Payer: Commercial Managed Care - PPO | Source: Ambulatory Visit | Attending: Family Medicine | Admitting: Family Medicine

## 2022-06-09 DIAGNOSIS — N6320 Unspecified lump in the left breast, unspecified quadrant: Secondary | ICD-10-CM

## 2022-06-09 DIAGNOSIS — R599 Enlarged lymph nodes, unspecified: Secondary | ICD-10-CM

## 2022-06-09 HISTORY — PX: BREAST BIOPSY: SHX20

## 2022-06-13 ENCOUNTER — Telehealth: Payer: Self-pay | Admitting: Hematology and Oncology

## 2022-06-13 NOTE — Telephone Encounter (Signed)
Spoke to patient to confirm upcoming morning [facility] clinic appointment on 1/31, paperwork will be sent via e-mail.   Gave location and time, also informed patient that the surgeon's office would be calling as well to get information from them similar to the packet that they will be receiving so make sure to do both.  Reminded patient that all providers will be coming to the clinic to see them HERE and if they had any questions to not hesitate to reach back out to myself or their navigators.

## 2022-06-19 ENCOUNTER — Encounter: Payer: Self-pay | Admitting: *Deleted

## 2022-06-19 DIAGNOSIS — C50212 Malignant neoplasm of upper-inner quadrant of left female breast: Secondary | ICD-10-CM | POA: Insufficient documentation

## 2022-06-20 NOTE — Progress Notes (Signed)
Radiation Oncology         [phone]) [phone] ________________________________  Initial Outpatient Consultation  Name: Diamond Marshall MRN: [id number]  Date: 06/21/2022  DOB: 09/28/1970  OX:[id number], Cletus Gash, MD  Erroll Luna, MD   REFERRING PHYSICIAN: Erroll Luna, MD  DIAGNOSIS: No diagnosis found.   Cancer Staging  No matching staging information was found for the patient.  Stage *** Left Breast UIQ, Invasive ductal carcinoma with nodal involvement, ER+ / PR+ / Her2+, Grade 3  CHIEF COMPLAINT: Here to discuss management of left breast cancer  HISTORY OF PRESENT ILLNESS::Diamond Marshall is a 52 y.o. female who presented for a diagnostic bilateral breast mammogram and left breast ultrasound on 06/06/22, for evaluation of palpable tender, and erythematous area in the left breast and left axilla. Diagnostic imaging demonstrated an irregular hypoechoic mass in the 9 o'clock left breast 7 cmfn, (measuring 3.1 x 1.5 x 1.7 cm sonographically, and 5.8 cm mammographically), and at least 2 abnormal left axillary lymph nodes. At the time of this study, the patient also brought to attention an additional palpable mass in the left lateral neck/supraclavicular region. Sonographic evaluation of this area showed a a probable lymph node measuring 1.9 x 1.2 x 1.4 cm.     Biopsy of the 9 o'clock left breast on date of 06/09/21 showed grade 3 invasive ductal carcinoma measuring 1.2 cm in the greatest linear extent of the sample, with LVI.  ER status: 65% positive with weak-strong staining intensity; PR status 15% positive with weak-moderate staining intensity; Proliferation marker Ki67 at 50%; Her2 status positive; Grade 3. Left axillary nodal biopsy also performed showed metastatic carcinoma.   ***  PREVIOUS RADIATION THERAPY: No  PAST MEDICAL HISTORY:  has a past medical history of Anemia, Anxiety, B12 deficiency, Back pain, Depression, Gallbladder problem, GERD (gastroesophageal reflux disease),  Hypothyroidism, Joint pain, Knee pain, Palpitation, Pernicious anemia, SOB (shortness of breath), Thyroid disease, and Vitamin D deficiency.    PAST SURGICAL HISTORY: Past Surgical History:  Procedure Laterality Date   BREAST BIOPSY Left 06/09/2022   [place] LT BREAST BX W LOC DEV 1ST LESION IMG BX SPEC US GUIDE 06/09/2022 [facility]MO[facility]   CESAREAN SECTION  1999   Dilatation and currettagement     for miscarriage 18 years ago    FAMILY HISTORY: family history includes Anxiety disorder in her mother; Breast cancer (age of onset: 10) in an other family member; CVA in her father; Depression in her mother; Diabetes in her mother; High Cholesterol in her mother; Hypertension in her mother; Obesity in her mother; Thyroid disease in her mother.  SOCIAL HISTORY:  reports that she has never smoked. She has never used smokeless tobacco. She reports that she does not drink alcohol and does not use drugs.  ALLERGIES: Phentermine, Sulfa antibiotics, Topiramate, and Septra [sulfamethoxazole-trimethoprim]  MEDICATIONS:  Current Outpatient Medications  Medication Sig Dispense Refill   albuterol (VENTOLIN HFA) 108 (90 Base) MCG/ACT inhaler Inhale 2 puffs into the lungs every 6 (six) hours as needed for wheezing or shortness of breath. 8 g 0   benzonatate (TESSALON) 200 MG capsule Take 1 capsule (200 mg total) by mouth 3 (three) times daily as needed. 30 capsule 1   cyanocobalamin (,VITAMIN B-12,) 1000 MCG/ML injection INJECT 1 ML INTRAMUSCULARLY EVERY 15 DAYS 6 mL 1   cyclobenzaprine (FLEXERIL) 10 MG tablet Take 1 tablet (10 mg total) by mouth 3 (three) times daily as needed for muscle spasms. 30 tablet 1   diclofenac (VOLTAREN) 75 MG EC  tablet TAKE ONE TABLET TWICE DAILY AS NEEDED. needs appointment FOR refills 60 tablet 0   Dulaglutide (TRULICITY) 1.5 PZ/0.2HE SOPN Inject content of one pen under the skin weekly 6 mL 3   EPINEPHrine 0.3 mg/0.3 mL IJ SOAJ injection Inject 0.3 mg into the muscle as needed for  anaphylaxis. 2 each 0   escitalopram (LEXAPRO) 20 MG tablet Take 1 tablet (20 mg total) by mouth daily. 90 tablet 3   levothyroxine (SYNTHROID) 50 MCG tablet Take 1 tablet (50 mcg total) by mouth daily. 90 tablet 3   LORazepam (ATIVAN) 0.5 MG tablet Take 1 tablet (0.5 mg total) by mouth 2 (two) times daily as needed for anxiety. 30 tablet 5   pantoprazole (PROTONIX) 40 MG tablet Take 2 tablets (80 mg total) by mouth daily. 180 tablet 3   predniSONE (STERAPRED UNI-PAK 21 TAB) 10 MG (21) TBPK tablet Use as directed 21 tablet 0   No current facility-administered medications for this encounter.    REVIEW OF SYSTEMS: As above in HPI.   PHYSICAL EXAM:  vitals were not taken for this visit.   General: Alert and oriented, in no acute distress HEENT: Head is normocephalic. Extraocular movements are intact. Oropharynx is clear. Neck: Neck is supple, no palpable cervical or supraclavicular lymphadenopathy. Heart: Regular in rate and rhythm with no murmurs, rubs, or gallops. Chest: Clear to auscultation bilaterally, with no rhonchi, wheezes, or rales. Abdomen: Soft, nontender, nondistended, with no rigidity or guarding. Extremities: No cyanosis or edema. Lymphatics: see Neck Exam Skin: No concerning lesions. Musculoskeletal: symmetric strength and muscle tone throughout. Neurologic: Cranial nerves II through XII are grossly intact. No obvious focalities. Speech is fluent. Coordination is intact. Psychiatric: Judgment and insight are intact. Affect is appropriate. Breasts: *** . No other palpable masses appreciated in the breasts or axillae *** .    ECOG = ***  0 - Asymptomatic (Fully active, able to carry on all predisease activities without restriction)  1 - Symptomatic but completely ambulatory (Restricted in physically strenuous activity but ambulatory and able to carry out work of a light or sedentary nature. For example, light housework, office work)  2 - Symptomatic, <50% in bed during  the day (Ambulatory and capable of all self care but unable to carry out any work activities. Up and about more than 50% of waking hours)  3 - Symptomatic, >50% in bed, but not bedbound (Capable of only limited self-care, confined to bed or chair 50% or more of waking hours)  4 - Bedbound (Completely disabled. Cannot carry on any self-care. Totally confined to bed or chair)  5 - Death   Eustace Pen MM, Creech RH, Tormey DC, et al. [phone]). "Toxicity and response criteria of the [facility]". [place] Oncol. 5 (6): 649-55   LABORATORY DATA:  Lab Results  Component Value Date   WBC 7.1 07/21/2021   HGB 12.0 07/21/2021   HCT 37.6 07/21/2021   MCV 76 (L) 07/21/2021   PLT 359 07/21/2021   CMP     Component Value Date/Time   NA 139 03/28/2022 0811   K 4.6 03/28/2022 0811   CL 102 03/28/2022 0811   CO2 22 03/28/2022 0811   GLUCOSE 90 03/28/2022 0811   GLUCOSE 153 (H) 10/27/2019 2208   BUN 14 03/28/2022 0811   CREATININE 0.92 03/28/2022 0811   CREATININE 0.74 10/09/2012 1537   CALCIUM 9.4 03/28/2022 0811   PROT 7.1 03/28/2022 0811   ALBUMIN 4.2 03/28/2022 0811   AST  27 03/28/2022 0811   ALT 24 03/28/2022 0811   ALKPHOS 125 (H) 03/28/2022 0811   BILITOT 0.2 03/28/2022 0811   GFRNONAA 83 05/18/2020 1442   GFRNONAA >89 10/09/2012 1537   GFRAA 96 05/18/2020 1442   GFRAA >89 10/09/2012 1537         RADIOGRAPHY: [place] LT BREAST BX W LOC DEV 1ST LESION IMG BX SPEC US GUIDE  Addendum Date: 06/15/2022   ADDENDUM REPORT: 06/15/2022 14:01 ADDENDUM: Pathology revealed GRADE III INVASIVE DUCTAL CARCINOMA, LYMPHOVASCULAR INVASION: PRESENT of the LEFT breast, 9:00 o'clock, 7 cmfn, (ribbon clip). This was found to be concordant by Dr. Audie Pinto. Pathology revealed LYMPH NODE WITH METASTATIC CARCINOMA of the LEFT axilla, (hydromark clip). This was found to be concordant by Dr. Audie Pinto. Pathology results were discussed with the patient by telephone. The patient  reported doing well after the biopsies with tenderness and bruising at the sites, more in the axilla. Post biopsy instructions and care were reviewed and questions were answered. The patient was encouraged to call [facility] for any additional concerns. My direct phone number was provided. The patient was referred to [facility] on June 21, 2022. Recommend further imaging (possible dedicated neck ultrasound versus neck CT) to evaluate the possible lymph node in the LEFT supraclavicular region, per previous diagnostic report. Pathology results reported by Terie Purser, RN on 06/12/2022. Electronically Signed   By: Audie Pinto M.D.   On: 06/15/2022 14:01   Result Date: 06/15/2022 CLINICAL DATA:  52 year old female presenting for biopsy a mass in the left breast and of a left axillary lymph node. EXAM: ULTRASOUND GUIDED LEFT BREAST CORE NEEDLE BIOPSY [place] AXILLARY NODE CORE BIOPSY LEFT COMPARISON:  Previous exam(s). PROCEDURE: I met with the patient and we discussed the procedure of ultrasound-guided biopsy, including benefits and alternatives. We discussed the high likelihood of a successful procedure. We discussed the risks of the procedure, including infection, bleeding, tissue injury, clip migration, and inadequate sampling. Informed written consent was given. The usual time-out protocol was performed immediately prior to the procedure. 1.  Lesion quadrant: Lower inner quadrant Using sterile technique and 1% Lidocaine as local anesthetic, under direct ultrasound visualization, a 14 gauge spring-loaded device was used to perform biopsy of a mass in the left breast at 9 o'clock using a lateral approach. At the conclusion of the procedure a ribbon shaped tissue marker clip was deployed into the biopsy cavity. Follow up 2 view mammogram was performed and dictated separately. 2.  Left axilla Using sterile  technique and 1% Lidocaine as local anesthetic, under direct ultrasound visualization, a 14 gauge spring-loaded device was used to perform biopsy of a left axillary lymph node using a lateral approach. At the conclusion of the procedure a HydroMARK shaped tissue marker clip was deployed into the biopsy cavity. Follow up 2 view mammogram was performed and dictated separately. IMPRESSION: Ultrasound guided biopsy of a mass in the left breast at 9 o'clock and of a left axillary lymph node. No apparent complications. Electronically Signed: By: Audie Pinto M.D. On: 06/09/2022 11:41  [place] AXILLARY NODE CORE BIOPSY LEFT  Addendum Date: 06/15/2022   ADDENDUM REPORT: 06/15/2022 14:01 ADDENDUM: Pathology revealed GRADE III INVASIVE DUCTAL CARCINOMA, LYMPHOVASCULAR INVASION: PRESENT of the LEFT breast, 9:00 o'clock, 7 cmfn, (ribbon clip). This was found to be concordant by Dr. Audie Pinto. Pathology revealed LYMPH NODE WITH METASTATIC CARCINOMA of the LEFT axilla, (hydromark  clip). This was found to be concordant by Dr. Audie Pinto. Pathology results were discussed with the patient by telephone. The patient reported doing well after the biopsies with tenderness and bruising at the sites, more in the axilla. Post biopsy instructions and care were reviewed and questions were answered. The patient was encouraged to call [facility] for any additional concerns. My direct phone number was provided. The patient was referred to [facility] on June 21, 2022. Recommend further imaging (possible dedicated neck ultrasound versus neck CT) to evaluate the possible lymph node in the LEFT supraclavicular region, per previous diagnostic report. Pathology results reported by Terie Purser, RN on 06/12/2022. Electronically Signed   By: Audie Pinto M.D.   On: 06/15/2022 14:01   Result Date: 06/15/2022 CLINICAL DATA:   52 year old female presenting for biopsy a mass in the left breast and of a left axillary lymph node. EXAM: ULTRASOUND GUIDED LEFT BREAST CORE NEEDLE BIOPSY [place] AXILLARY NODE CORE BIOPSY LEFT COMPARISON:  Previous exam(s). PROCEDURE: I met with the patient and we discussed the procedure of ultrasound-guided biopsy, including benefits and alternatives. We discussed the high likelihood of a successful procedure. We discussed the risks of the procedure, including infection, bleeding, tissue injury, clip migration, and inadequate sampling. Informed written consent was given. The usual time-out protocol was performed immediately prior to the procedure. 1.  Lesion quadrant: Lower inner quadrant Using sterile technique and 1% Lidocaine as local anesthetic, under direct ultrasound visualization, a 14 gauge spring-loaded device was used to perform biopsy of a mass in the left breast at 9 o'clock using a lateral approach. At the conclusion of the procedure a ribbon shaped tissue marker clip was deployed into the biopsy cavity. Follow up 2 view mammogram was performed and dictated separately. 2.  Left axilla Using sterile technique and 1% Lidocaine as local anesthetic, under direct ultrasound visualization, a 14 gauge spring-loaded device was used to perform biopsy of a left axillary lymph node using a lateral approach. At the conclusion of the procedure a HydroMARK shaped tissue marker clip was deployed into the biopsy cavity. Follow up 2 view mammogram was performed and dictated separately. IMPRESSION: Ultrasound guided biopsy of a mass in the left breast at 9 o'clock and of a left axillary lymph node. No apparent complications. Electronically Signed: By: Audie Pinto M.D. On: 06/09/2022 11:41  MM DIAG BREAST TOMO BILATERAL  Addendum Date: 06/13/2022   ADDENDUM REPORT: 06/13/2022 13:01 ADDENDUM: An addendum is made to the findings section of this exam due to an omission. This should include: No suspicious masses or  calcifications are seen in the RIGHT BREAST. The remainder of the report is correct. Electronically Signed   By: Everlean Alstrom M.D.   On: 06/13/2022 13:01   Result Date: 06/13/2022 CLINICAL DATA:  52 year old female with a palpable area of concern in the left breast and left axilla. The patient states there is associated tenderness and skin erythema. EXAM: DIGITAL DIAGNOSTIC BILATERAL MAMMOGRAM WITH TOMOSYNTHESIS; ULTRASOUND LEFT BREAST LIMITED TECHNIQUE: Bilateral digital diagnostic mammography and breast tomosynthesis was performed.; Targeted ultrasound examination of the left breast was performed. COMPARISON:  Previous exam(s). ACR Breast Density Category b: There are scattered areas of fibroglandular density. FINDINGS: There is an irregular spiculated mass with associated surrounding nodularity all together measuring up to 5.8 cm. There is mild associated tethering of the skin of the inner left breast where the palpable marker is  placed. There are partially visualized abnormal lymph nodes in the left axilla. Physical examination at site of palpable concern in the inner left breast reveals mild erythema within an associated firm focal area of thickening. Targeted ultrasound of the inner left breast was performed. There is an irregular hypoechoic mass in the left breast at 9 o'clock 7 cm from nipple measuring 3.1 x 1.5 x 1.7 cm. This corresponds well with the mass seen in the inner left breast at mammography. This mass was difficult to visualized sonographically due to deep location and patient body habitus, with the mammographic measurement likely more accurate. At least 2 lymph nodes with abnormal cortical thickening are identified in the left axilla. In addition, the patient points to the left lateral neck/supraclavicular region stating she has an additional palpable mass in this location. Brief sonographic evaluation of the supraclavicular left neck reveals a probable lymph node measuring 1.9 x 1.2 x 1.4  cm. IMPRESSION: 1. Suspicious spiculated palpable mass in the left breast with associated surrounding nodularity at the 9 o'clock position. This measures up to 3.1 cm sonographically, however due to deep location and patient body habitus the mammographic measurement of 5.8 cm is felt to be more accurate. 2.  At least 2 abnormal lymph nodes in the left axilla. 3.  Possible abnormal palpable left supraclavicular lymph node. RECOMMENDATION: 1. Recommend ultrasound-guided core biopsy of the mass in the left breast at the 9 o'clock position. 2. Recommend ultrasound-guided core biopsy of 1 of the abnormal lymph nodes in the left axilla. 2. Recommend further imaging (possible dedicated neck ultrasound versus neck CT) to evaluate the possible lymph node in the left supraclavicular region. I have discussed the findings and recommendations with the patient. If applicable, a reminder letter will be sent to the patient regarding the next appointment. BI-RADS CATEGORY  5: Highly suggestive of malignancy. Electronically Signed: By: Everlean Alstrom M.D. On: 06/06/2022 15:30   US BREAST LTD UNI LEFT INC AXILLA  Addendum Date: 06/13/2022   ADDENDUM REPORT: 06/13/2022 13:01 ADDENDUM: An addendum is made to the findings section of this exam due to an omission. This should include: No suspicious masses or calcifications are seen in the RIGHT BREAST. The remainder of the report is correct. Electronically Signed   By: Everlean Alstrom M.D.   On: 06/13/2022 13:01   Result Date: 06/13/2022 CLINICAL DATA:  52 year old female with a palpable area of concern in the left breast and left axilla. The patient states there is associated tenderness and skin erythema. EXAM: DIGITAL DIAGNOSTIC BILATERAL MAMMOGRAM WITH TOMOSYNTHESIS; ULTRASOUND LEFT BREAST LIMITED TECHNIQUE: Bilateral digital diagnostic mammography and breast tomosynthesis was performed.; Targeted ultrasound examination of the left breast was performed. COMPARISON:  Previous  exam(s). ACR Breast Density Category b: There are scattered areas of fibroglandular density. FINDINGS: There is an irregular spiculated mass with associated surrounding nodularity all together measuring up to 5.8 cm. There is mild associated tethering of the skin of the inner left breast where the palpable marker is placed. There are partially visualized abnormal lymph nodes in the left axilla. Physical examination at site of palpable concern in the inner left breast reveals mild erythema within an associated firm focal area of thickening. Targeted ultrasound of the inner left breast was performed. There is an irregular hypoechoic mass in the left breast at 9 o'clock 7 cm from nipple measuring 3.1 x 1.5 x 1.7 cm. This corresponds well with the mass seen in the inner left breast at mammography. This mass was difficult  to visualized sonographically due to deep location and patient body habitus, with the mammographic measurement likely more accurate. At least 2 lymph nodes with abnormal cortical thickening are identified in the left axilla. In addition, the patient points to the left lateral neck/supraclavicular region stating she has an additional palpable mass in this location. Brief sonographic evaluation of the supraclavicular left neck reveals a probable lymph node measuring 1.9 x 1.2 x 1.4 cm. IMPRESSION: 1. Suspicious spiculated palpable mass in the left breast with associated surrounding nodularity at the 9 o'clock position. This measures up to 3.1 cm sonographically, however due to deep location and patient body habitus the mammographic measurement of 5.8 cm is felt to be more accurate. 2.  At least 2 abnormal lymph nodes in the left axilla. 3.  Possible abnormal palpable left supraclavicular lymph node. RECOMMENDATION: 1. Recommend ultrasound-guided core biopsy of the mass in the left breast at the 9 o'clock position. 2. Recommend ultrasound-guided core biopsy of 1 of the abnormal lymph nodes in the left  axilla. 2. Recommend further imaging (possible dedicated neck ultrasound versus neck CT) to evaluate the possible lymph node in the left supraclavicular region. I have discussed the findings and recommendations with the patient. If applicable, a reminder letter will be sent to the patient regarding the next appointment. BI-RADS CATEGORY  5: Highly suggestive of malignancy. Electronically Signed: By: Everlean Alstrom M.D. On: 06/06/2022 15:30   MM CLIP PLACEMENT LEFT  Result Date: 06/09/2022 CLINICAL DATA:  Post procedure mammogram for clip placement EXAM: 3D DIAGNOSTIC LEFT MAMMOGRAM POST ULTRASOUND BIOPSY COMPARISON:  Previous exam(s). FINDINGS: 3D Mammographic images were obtained following ultrasound guided biopsy of a mass in the left breast at 9 o'clock. The ribbon biopsy marking clip is in expected position at the site of biopsy. 3D Mammographic images were obtained following ultrasound guided biopsy of a left axillary lymph node. [facility] biopsy marking clip is in expected position at the site of biopsy. IMPRESSION: Appropriate positioning of the ribbon shaped biopsy marking clip at the site of biopsy in the left breast at 9 o'clock. Appropriate positioning of the [facility] shaped biopsy marking clip at the site of biopsy in the left axilla. Final Assessment: Post Procedure Mammograms for Marker Placement Electronically Signed   By: Audie Pinto M.D.   On: 06/09/2022 11:38     IMPRESSION/PLAN: ***   It was a pleasure meeting the patient today. We discussed the risks, benefits, and side effects of radiotherapy. I recommend radiotherapy to the *** to reduce her risk of locoregional recurrence by 2/3.  We discussed that radiation would take approximately *** weeks to complete and that I would give the patient a few weeks to heal following surgery before starting treatment planning. *** If chemotherapy were to be given, this would precede radiotherapy. We spoke about acute effects including skin  irritation and fatigue as well as much less common late effects including internal organ injury or irritation. We spoke about the latest technology that is used to minimize the risk of late effects for patients undergoing radiotherapy to the breast or chest wall. No guarantees of treatment were given. The patient is enthusiastic about proceeding with treatment. I look forward to participating in the patient's care.  I will await her referral back to me for postoperative follow-up and eventual CT simulation/treatment planning.  On date of service, in total, I spent *** minutes on this encounter. Patient was seen in person.   __________________________________________   Eppie Gibson, MD  This  document serves as a record of services personally performed by Eppie Gibson, MD. It was created on her behalf by Roney Mans, a trained medical scribe. The creation of this record is based on the scribe's personal observations and the provider's statements to them. This document has been checked and approved by the attending provider.

## 2022-06-21 ENCOUNTER — Encounter: Payer: Self-pay | Admitting: *Deleted

## 2022-06-21 ENCOUNTER — Encounter: Payer: Self-pay | Admitting: Hematology and Oncology

## 2022-06-21 ENCOUNTER — Encounter: Payer: Self-pay | Admitting: Physical Therapy

## 2022-06-21 ENCOUNTER — Other Ambulatory Visit: Payer: Self-pay

## 2022-06-21 ENCOUNTER — Inpatient Hospital Stay: Payer: Commercial Managed Care - PPO | Admitting: Licensed Clinical Social Worker

## 2022-06-21 ENCOUNTER — Inpatient Hospital Stay: Payer: Commercial Managed Care - PPO

## 2022-06-21 ENCOUNTER — Ambulatory Visit: Payer: Commercial Managed Care - PPO | Attending: Surgery | Admitting: Physical Therapy

## 2022-06-21 ENCOUNTER — Encounter: Payer: Self-pay | Admitting: Emergency Medicine

## 2022-06-21 ENCOUNTER — Ambulatory Visit
Admission: RE | Admit: 2022-06-21 | Discharge: 2022-06-21 | Disposition: A | Discharge status: Home / Self Care | Payer: Commercial Managed Care - PPO | Source: Ambulatory Visit | Attending: Radiation Oncology | Admitting: Radiation Oncology

## 2022-06-21 ENCOUNTER — Encounter: Payer: Self-pay | Admitting: Radiation Oncology

## 2022-06-21 ENCOUNTER — Ambulatory Visit: Payer: Self-pay | Admitting: Surgery

## 2022-06-21 ENCOUNTER — Inpatient Hospital Stay: Payer: Commercial Managed Care - PPO | Attending: Hematology and Oncology | Admitting: Hematology and Oncology

## 2022-06-21 ENCOUNTER — Encounter: Payer: Self-pay | Admitting: Genetic Counselor

## 2022-06-21 ENCOUNTER — Inpatient Hospital Stay (HOSPITAL_BASED_OUTPATIENT_CLINIC_OR_DEPARTMENT_OTHER): Payer: Commercial Managed Care - PPO | Admitting: Genetic Counselor

## 2022-06-21 ENCOUNTER — Other Ambulatory Visit: Payer: Self-pay | Admitting: *Deleted

## 2022-06-21 VITALS — BP 174/105 | HR 94 | Temp 97.5°F | Resp 17 | Wt 334.0 lb

## 2022-06-21 DIAGNOSIS — Z170 Estrogen receptor positive status [ER+]: Secondary | ICD-10-CM

## 2022-06-21 DIAGNOSIS — Z800 Family history of malignant neoplasm of digestive organs: Secondary | ICD-10-CM

## 2022-06-21 DIAGNOSIS — R591 Generalized enlarged lymph nodes: Secondary | ICD-10-CM

## 2022-06-21 DIAGNOSIS — C50412 Malignant neoplasm of upper-outer quadrant of left female breast: Secondary | ICD-10-CM | POA: Insufficient documentation

## 2022-06-21 DIAGNOSIS — R059 Cough, unspecified: Secondary | ICD-10-CM

## 2022-06-21 DIAGNOSIS — C50212 Malignant neoplasm of upper-inner quadrant of left female breast: Secondary | ICD-10-CM | POA: Diagnosis present

## 2022-06-21 DIAGNOSIS — Z8041 Family history of malignant neoplasm of ovary: Secondary | ICD-10-CM

## 2022-06-21 DIAGNOSIS — R293 Abnormal posture: Secondary | ICD-10-CM | POA: Diagnosis present

## 2022-06-21 DIAGNOSIS — Z803 Family history of malignant neoplasm of breast: Secondary | ICD-10-CM

## 2022-06-21 LAB — CBC WITH DIFFERENTIAL (CANCER CENTER ONLY)
Abs Immature Granulocytes: 0.02 10*3/uL (ref 0.00–0.07)
Basophils Absolute: 0.1 10*3/uL (ref 0.0–0.1)
Basophils Relative: 1 %
Eosinophils Absolute: 0.2 10*3/uL (ref 0.0–0.5)
Eosinophils Relative: 3 %
HCT: 37.9 % (ref 36.0–46.0)
Hemoglobin: 12 g/dL (ref 12.0–15.0)
Immature Granulocytes: 0 %
Lymphocytes Relative: 26 %
Lymphs Abs: 1.9 10*3/uL (ref 0.7–4.0)
MCH: 24.9 pg — ABNORMAL LOW (ref 26.0–34.0)
MCHC: 31.7 g/dL (ref 30.0–36.0)
MCV: 78.6 fL — ABNORMAL LOW (ref 80.0–100.0)
Monocytes Absolute: 0.5 10*3/uL (ref 0.1–1.0)
Monocytes Relative: 6 %
Neutro Abs: 4.7 10*3/uL (ref 1.7–7.7)
Neutrophils Relative %: 64 %
Platelet Count: 342 10*3/uL (ref 150–400)
RBC: 4.82 MIL/uL (ref 3.87–5.11)
RDW: 16.2 % — ABNORMAL HIGH (ref 11.5–15.5)
WBC Count: 7.4 10*3/uL (ref 4.0–10.5)
nRBC: 0 % (ref 0.0–0.2)

## 2022-06-21 LAB — CMP (CANCER CENTER ONLY)
ALT: 18 U/L (ref 0–44)
AST: 22 U/L (ref 15–41)
Albumin: 3.9 g/dL (ref 3.5–5.0)
Alkaline Phosphatase: 89 U/L (ref 38–126)
Anion gap: 6 (ref 5–15)
BUN: 15 mg/dL (ref 6–20)
CO2: 29 mmol/L (ref 22–32)
Calcium: 9.5 mg/dL (ref 8.9–10.3)
Chloride: 105 mmol/L (ref 98–111)
Creatinine: 0.81 mg/dL (ref 0.44–1.00)
GFR, Estimated: >60 mL/min (ref >60)
Glucose, Bld: 94 mg/dL (ref 70–99)
Potassium: 4.4 mmol/L (ref 3.5–5.1)
Sodium: 140 mmol/L (ref 135–145)
Total Bilirubin: 0.3 mg/dL (ref 0.3–1.2)
Total Protein: 7.6 g/dL (ref 6.5–8.1)

## 2022-06-21 LAB — GENETIC SCREENING ORDER

## 2022-06-21 NOTE — Therapy (Signed)
OUTPATIENT PHYSICAL THERAPY BREAST CANCER BASELINE EVALUATION   Patient Name: Diamond Marshall MRN: [id number] DOB:1970-08-07, 52 y.o., female Today's Date: 06/21/2022  END OF SESSION:  PT End of Session - 06/21/22 1042     Visit Number 1    Number of Visits 2    Date for PT Re-Evaluation 12/20/22    PT Start Time 1009    PT Stop Time 1028   Also saw pt from [id number] for a total of 46 min   PT Time Calculation (min) 19 min    Activity Tolerance Treatment limited secondary to agitation    Behavior During Therapy [facility] for tasks assessed/performed             Past Medical History:  Diagnosis Date   Anemia    b12 deficiency   Anxiety    B12 deficiency    Back pain    Depression    Gallbladder problem    GERD (gastroesophageal reflux disease)    Hypertension    Hypothyroidism    Joint pain    Knee pain    Palpitation    Pernicious anemia    SOB (shortness of breath)    Thyroid disease    hypothyroidism   Vitamin D deficiency    Past Surgical History:  Procedure Laterality Date   BREAST BIOPSY Left 06/09/2022   [place] LT BREAST BX W LOC DEV 1ST LESION IMG BX SPEC US GUIDE 06/09/2022 [facility] MAMMOGRAPHY   CESAREAN SECTION  1999   Dilatation and currettagement     for miscarriage 18 years ago   Patient Active Problem List   Diagnosis Date Noted   Malignant neoplasm of upper-inner quadrant of left breast in female, estrogen receptor positive ([place]) 06/19/2022   Chronic pain of right knee 03/27/2022   Essential hypertension 03/27/2022   Positive self-administered antigen test for COVID-19 03/15/2021   Prediabetes 07/22/2019   Anxiety and depression 03/15/2016   Vitamin D deficiency 00/93/8182   Metabolic syndrome X 99/37/1696   Elevated random blood glucose level 08/07/2013   Hypertriglyceridemia 08/07/2013   ADD (attention deficit disorder) 07/24/2013    hyperlipidemia 10/09/2012   Morbid obesity ([place]) 10/09/2012   Hypothyroidism 10/09/2012   B12 deficiency  10/09/2012   Anemia, unspecified 02/12/2011   Dysthymic disorder 02/12/2011   Migraine headache 02/12/2011   Obsessive-compulsive disorder 02/12/2011   Tinnitus 02/12/2011   REFERRING PROVIDER: Dr. Marcello Moores [place]  REFERRING DIAG: Left breast cancer  THERAPY DIAG:  Malignant neoplasm of upper-outer quadrant of left breast in female, estrogen receptor positive ([place])  Abnormal posture  Rationale for Evaluation and Treatment: Rehabilitation  ONSET DATE: 06/06/2022  SUBJECTIVE:  SUBJECTIVE STATEMENT: Patient reports she is here today to be seen by her medical team for her newly diagnosed left breast cancer.   PERTINENT HISTORY:  Patient was diagnosed on 06/06/2022 with left grade 3 invasive ductal carcinoma breast cancer. It measures 3.1 cm and is located in the upper outer quadrant. It is triple positive with a Ki67 of 50%.   PATIENT GOALS:   reduce lymphedema risk and learn post op HEP.   PAIN:  Are you having pain? No  PRECAUTIONS: Active CA   HAND DOMINANCE: right  WEIGHT BEARING RESTRICTIONS: No  FALLS:  Has patient fallen in last 6 months? No  LIVING ENVIRONMENT: Patient lives with: her husband Lives in: House/apartment Has following equipment at home: None  OCCUPATION: full time case Freight forwarder for [employer]  LEISURE: She doesn't exercise  PRIOR LEVEL OF FUNCTION: Independent   OBJECTIVE:  COGNITION: Overall cognitive status: Within functional limits for tasks assessed    POSTURE:  Forward head and rounded shoulders posture  UPPER EXTREMITY AROM/PROM:  A/PROM RIGHT   eval   Shoulder extension 46  Shoulder flexion 145  Shoulder abduction 142  Shoulder internal rotation 67  Shoulder external rotation 88    (Blank rows = not tested)  A/PROM LEFT   eval  Shoulder  extension 48  Shoulder flexion 144  Shoulder abduction 155  Shoulder internal rotation 79  Shoulder external rotation 83    (Blank rows = not tested)  CERVICAL AROM: All within normal limits   UPPER EXTREMITY STRENGTH: WFL  LYMPHEDEMA ASSESSMENTS:   LANDMARK RIGHT   eval  10 cm proximal to olecranon process 51.4  Olecranon process 31.2  10 cm proximal to ulnar styloid process 29.3  Just proximal to ulnar styloid process 20.1  Across hand at thumb web space 19.8  At base of 2nd digit 6.9  (Blank rows = not tested)  LANDMARK LEFT   eval  10 cm proximal to olecranon process 51.2  Olecranon process 30.2  10 cm proximal to ulnar styloid process 28.7  Just proximal to ulnar styloid process 21  Across hand at thumb web space 20.1  At base of 2nd digit 6.7  (Blank rows = not tested)  L-DEX LYMPHEDEMA SCREENING:  The patient was assessed using the L-Dex machine today to produce a lymphedema index baseline score. The patient will be reassessed on a regular basis (typically every 3 months) to obtain new L-Dex scores. If the score is > 6.5 points away from his/her baseline score indicating onset of subclinical lymphedema, it will be recommended to wear a compression garment for 4 weeks, 12 hours per day and then be reassessed. If the score continues to be > 6.5 points from baseline at reassessment, we will initiate lymphedema treatment. Assessing in this manner has a 95% rate of preventing clinically significant lymphedema.   L-DEX FLOWSHEETS - 06/21/22 1100       L-DEX LYMPHEDEMA SCREENING   Measurement Type Unilateral    L-DEX MEASUREMENT EXTREMITY Upper Extremity    POSITION  Standing    DOMINANT SIDE Right    At Risk Side Left    BASELINE SCORE (UNILATERAL) 1.4             QUICK DASH SURVEY:  Katina Dung - 06/21/22 0001     Open a tight or new jar No difficulty    Do heavy household chores (wash walls, wash floors) No difficulty    Carry a shopping bag or briefcase  No difficulty  Wash your back No difficulty    Use a knife to cut food No difficulty    Recreational activities in which you take some force or impact through your arm, shoulder, or hand (golf, hammering, tennis) Mild difficulty    During the past week, to what extent has your arm, shoulder or hand problem interfered with your normal social activities with family, friends, neighbors, or groups? Not at all    During the past week, to what extent has your arm, shoulder or hand problem limited your work or other regular daily activities Not at all    Arm, shoulder, or hand pain. None    Tingling (pins and needles) in your arm, shoulder, or hand None    Difficulty Sleeping No difficulty    DASH Score 2.27 %              PATIENT EDUCATION:  Education details: Lymphedema risk reduction and post op shoulder/posture HEP Person educated: Patient Education method: Explanation, Demonstration, Handout Education comprehension: Patient verbalized understanding and returned demonstration  HOME EXERCISE PROGRAM: Patient was instructed today in a home exercise program today for post op shoulder range of motion. These included active assist shoulder flexion in sitting, scapular retraction, wall walking with shoulder abduction, and hands behind head external rotation.  She was encouraged to do these twice a day, holding 3 seconds and repeating 5 times when permitted by her physician.   ASSESSMENT:  CLINICAL IMPRESSION: Patient was diagnosed on 06/06/2022 with left grade 3 invasive ductal carcinoma breast cancer. It measures 3.1 cm and is located in the upper outer quadrant. It is triple positive with a Ki67 of 50%.  Her multidisciplinary medical team met prior to her assessments to determine a recommended treatment plan. She is planning to have staging scans, neoadjuvant chemotherapy, breast surgery with possibly a targeted node dissection, radiation, and anti-estrogen therapy. She will benefit from a  post op PT reassessment to determine needs and from L-Dex screens every 3 months for 2 years to detect subclinical lymphedema.  Pt will benefit from skilled therapeutic intervention to improve on the following deficits: Decreased knowledge of precautions, impaired UE functional use, pain, decreased ROM, postural dysfunction.   PT treatment/interventions: ADL/self-care home management, pt/family education, therapeutic exercise  REHAB POTENTIAL: Excellent  CLINICAL DECISION MAKING: Stable/uncomplicated  EVALUATION COMPLEXITY: Low   GOALS: Goals reviewed with patient? YES  LONG TERM GOALS: (STG=LTG)    Name Target Date Goal status  1 Pt will be able to verbalize understanding of pertinent lymphedema risk reduction practices relevant to her dx specifically related to skin care.  Baseline:  No knowledge 06/21/2022 Achieved at eval  2 Pt will be able to return demo and/or verbalize understanding of the post op HEP related to regaining shoulder ROM. Baseline:  No knowledge 06/21/2022 Achieved at eval  3 Pt will be able to verbalize understanding of the importance of attending the post op After Breast CA Class for further lymphedema risk reduction education and therapeutic exercise.  Baseline:  No knowledge 06/21/2022 Achieved at eval  4 Pt will demo she has regained full shoulder ROM and function post operatively compared to baselines.  Baseline: See objective measurements taken today. 12/20/2022     PLAN:  PT FREQUENCY/DURATION: EVAL and 1 follow up appointment.   PLAN FOR NEXT SESSION: will reassess 3-4 weeks post op to determine needs.   Patient will follow up at outpatient cancer rehab 3-4 weeks following surgery.  If the patient requires physical therapy at that  time, a specific plan will be dictated and sent to the referring physician for approval. The patient was educated today on appropriate basic range of motion exercises to begin post operatively and the importance of attending the  After Breast Cancer class following surgery.  Patient was educated today on lymphedema risk reduction practices as it pertains to recommendations that will benefit the patient immediately following surgery.  She verbalized good understanding.    Physical Therapy Information for After Breast Cancer Surgery/Treatment:  Lymphedema is a swelling condition that you may be at risk for in your arm if you have lymph nodes removed from the armpit area.  After a sentinel node biopsy, the risk is approximately 5-9% and is higher after an axillary node dissection.  There is treatment available for this condition and it is not life-threatening.  Contact your physician or physical therapist with concerns. You may begin the 4 shoulder/posture exercises (see additional sheet) when permitted by your physician (typically a week after surgery).  If you have drains, you may need to wait until those are removed before beginning range of motion exercises.  A general recommendation is to not lift your arms above shoulder height until drains are removed.  These exercises should be done to your tolerance and gently.  This is not a "no pain/no gain" type of recovery so listen to your body and stretch into the range of motion that you can tolerate, stopping if you have pain.  If you are having immediate reconstruction, ask your plastic surgeon about doing exercises as he or she may want you to wait. We encourage you to attend the free one time ABC (After Breast Cancer) class offered by [facility].  You will learn information related to lymphedema risk, prevention and treatment and additional exercises to regain mobility following surgery.  You can call [phone] for more information.  This is offered the 1st and 3rd Monday of each month.  You only attend the class one time. While undergoing any medical procedure or treatment, try to avoid blood pressure being taken or needle sticks from occurring on the arm  on the side of cancer.   This recommendation begins after surgery and continues for the rest of your life.  This may help reduce your risk of getting lymphedema (swelling in your arm). An excellent resource for those seeking information on lymphedema is the National Lymphedema Network's web site. It can be accessed at [place] If you notice swelling in your hand, arm or breast at any time following surgery (even if it is many years from now), please contact your doctor or physical therapist to discuss this.  Lymphedema can be treated at any time but it is easier for you if it is treated early on.  If you feel like your shoulder motion is not returning to normal in a reasonable amount of time, please contact your surgeon or physical therapist.  [facility] [phone]. [address], [place] [address]  ABC CLASS After Breast Cancer Class  After Breast Cancer Class is a specially designed exercise class to assist you in a safe recover after having breast cancer surgery.  In this class you will learn how to get back to full function whether your drains were just removed or if you had surgery a month ago.  This one-time class is held the 1st and 3rd Monday of every month from 11:00 a.m. until 12:00 noon virtually.  This class is FREE  and space is limited. For more information or to register for the next available class, call [phone].  Class Goals  Understand specific stretches to improve the flexibility of you chest and shoulder. Learn ways to safely strengthen your upper body and improve your posture. Understand the warning signs of infection and why you may be at risk for an arm infection. Learn about Lymphedema and prevention.  ** You do not attend this class until after surgery.  Drains must be removed to participate  Patient was instructed today in a home exercise program today for post op shoulder range of motion. These included  active assist shoulder flexion in sitting, scapular retraction, wall walking with shoulder abduction, and hands behind head external rotation.  She was encouraged to do these twice a day, holding 3 seconds and repeating 5 times when permitted by her physician.  Annia Friendly, [place] 06/21/22 11:39 AM

## 2022-06-21 NOTE — Progress Notes (Signed)
[place] Work  Initial Assessment   Diamond Marshall is a 52 y.o. year old female accompanied by spouse, Legrand Como. Clinical Social Work was referred by  [facility]  for assessment of psychosocial needs.   SDOH (Social Determinants of Health) assessments performed: Yes SDOH Interventions    Flowsheet Row Clinical Support from 06/21/2022 in [place] at [facility] Office Visit from 07/21/2021 in [place] Office Visit from 08/10/2020 in [place] Office Visit from 04/03/2019 in [place] Office Visit from 07/19/2018 in [place] Office Visit from 07/26/2016 in [facility]  SDOH Interventions        Food Insecurity Interventions Intervention Not Indicated -- -- -- -- --  Housing Interventions Intervention Not Indicated -- -- -- -- --  Transportation Interventions Intervention Not Indicated -- -- -- -- --  Depression Interventions/Treatment  -- Currently on Treatment Currently on Treatment Medication Currently on Treatment Currently on Treatment       SDOH Screenings   Food Insecurity: No Food Insecurity (06/21/2022)  Housing: [place]  (06/21/2022)  Transportation Needs: No Transportation Needs (06/21/2022)  Depression (PHQ2-9): Low Risk  (04/28/2022)  Tobacco Use: Low Risk  (06/21/2022)     Distress Screen completed: Yes    06/21/2022   12:17 PM  ONCBCN DISTRESS SCREENING  Screening Type Initial Screening  Distress experienced in past week (1-10) 6  Practical problem type Insurance  Emotional problem type Nervousness/Anxiety;Adjusting to illness;Feeling hopeless  Information Concerns Type Lack of info about diagnosis  Physical Problem type Sleep/insomnia      Family/Social Information:  Housing Arrangement: patient lives with husband . Adult daughter, Judson Roch 33 in  [place] Family members/support persons in your life? 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 Not sure if she has short-term disability if needed Income source: Employment and husband's employment Financial concerns:  Potentially depending on final diagnosis, treatment plan, and time off needed Type of concern: None Food access concerns: no Religious or spiritual practice: Not known Services Currently in place:  [employer];  FMLA eligible through work; well-managed medication for mood (Lexapro, Ativan PRN)  Coping/ Adjustment to diagnosis: Patient understands treatment plan and what happens next? Pt is waiting on PET scan to determine staging and will then have final treatment plan Concerns about diagnosis and/or treatment: Overwhelmed by information Patient reported stressors: Insurance, Anxiety/ nervousness, Adjusting to my illness, and Feeling hopeless Current coping skills/ strengths: Supportive family/friends     SUMMARY: Current SDOH Barriers:  Mental health concerns with adjusting to cancer diagnosis  Clinical Social Work Clinical Goal(s):  Patient will continue to utilize her prescribed medications and will access counseling as needed to assist in coping  Interventions: Discussed common feeling and emotions when being diagnosed with cancer, and the importance of support during treatment Informed patient of the support team roles and support services at [facility] Provided CSW contact information and encouraged patient to call with any questions or concerns   Follow Up Plan: Patient will contact CSW with any support or resource needs. CSW will check on coping periodically during treatment Patient verbalizes understanding of plan: Yes    [place] Alkire E Shmuel Girgis, LCSW

## 2022-06-21 NOTE — Progress Notes (Signed)
REFERRING PROVIDER: Benay Pike, MD  PRIMARY PROVIDER:  Claretta Fraise, MD  PRIMARY REASON FOR VISIT:  Encounter Diagnoses  Name Primary?   Malignant neoplasm of upper-inner quadrant of left breast in female, estrogen receptor positive ([place]) Yes   Family history of ovarian cancer    Family history of colon cancer    Family history of breast cancer    HISTORY OF PRESENT ILLNESS:   Diamond Marshall, a 52 y.o. female, was seen for a  cancer genetics consultation during the breast multidisciplinary clinic at the request of Dr. Chryl Heck due to a personal and family history of cancer.  Diamond Marshall presents to clinic today to discuss the possibility of a hereditary predisposition to cancer, to discuss genetic testing, and to further clarify her future cancer risks, as well as potential cancer risks for family members.   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 She is planning to have neoadjuvant chemotherapy, breast surgery, radiation, and anti-estrogen therapy.   RISK FACTORS:  Menarche was at age 76.  First live birth at age 74.  OCP use for approximately  22  years.  Ovaries intact: yes.  Uterus intact: yes.  HRT use: 0 years. Colonoscopy: no Mammogram within the last year: yes. Any excessive radiation exposure in the past: no  Past Medical History:  Diagnosis Date   Anemia    b12 deficiency   Anxiety    B12 deficiency    Back pain    Depression    Gallbladder problem    GERD (gastroesophageal reflux disease)    Hypothyroidism    Joint pain    Knee pain    Palpitation    Pernicious anemia    SOB (shortness of breath)    Thyroid disease    hypothyroidism   Vitamin D deficiency     Past Surgical History:  Procedure Laterality Date   BREAST BIOPSY Left 06/09/2022   [place] LT BREAST BX W LOC DEV 1ST LESION IMG BX [place] US GUIDE 06/09/2022 [facility]   CESAREAN  SECTION  1999   Dilatation and currettagement     for miscarriage 18 years ago    Social History   Socioeconomic History   Marital status: Married    Spouse name: Ronalee Belts   Number of children: Not on file   Years of education: Not on file   Highest education level: Not on file  Occupational History   Occupation: Adult Water engineer  Tobacco Use   Smoking status: Never   Smokeless tobacco: Never  Vaping Use   Vaping Use: Never used  Substance and Sexual Activity   Alcohol use: No   Drug use: No   Sexual activity: Yes    Birth control/protection: Pill  Other Topics Concern   Not on file  Social History Narrative   Not on file   Social Determinants of Health   Financial Resource Strain: Not on file  Food Insecurity: Not on file  Transportation Needs: Not on file  Physical Activity: Not on file  Stress: Not on file  Social Connections: Not on file     FAMILY HISTORY:  We obtained a detailed, 4-generation family history.  Significant diagnoses are listed below: Family History  Problem Relation Age of Onset   Hypertension Mother    Diabetes Mother    High Cholesterol Mother    Thyroid disease Mother    Depression  Mother    Anxiety disorder Mother    Obesity Mother    CVA Father        hemorrhagic   Ovarian cancer Maternal Aunt 30   Colon cancer Maternal Aunt    Breast cancer Cousin 76       maternal first cousin, reports negative genetic testing     Diamond Marshall has five maternal aunts. One maternal aunt has a history of ovarian cancer diagnosed at age 23 and a second maternal aunt has a history of colon cancer diagnosed at an unknown age. One of Diamond Marshall's maternal first cousins (daughter of an unaffected aunt) was diagnosed with breast cancer at age 44 and reportedly had negative hereditary cancer genetic testing. Of note, she does not have any information about her paternal family medical history. There is no reported Ashkenazi Jewish ancestry.   GENETIC  COUNSELING ASSESSMENT: Diamond Marshall is a 52 y.o. female with a personal and family history of cancer which is somewhat suggestive of a hereditary cancer syndrome and predisposition to cancer given her young age at diagnosis. We, therefore, discussed and recommended the following at today's visit.   DISCUSSION: We discussed that 5 - 10% of cancer is hereditary, with most cases of hereditary breast cancer associated with mutations in BRCA1/2.  There are other genes that can be associated with hereditary breast cancer syndromes. Type of cancer risk and level of risk are gene-specific. We discussed that testing is beneficial for several reasons including knowing how to follow individuals after completing their treatment, identifying whether potential treatment options would be beneficial, and understanding if other family members could be at risk for cancer and allowing them to undergo genetic testing.   We reviewed the characteristics, features and inheritance patterns of hereditary cancer syndromes. We also discussed genetic testing, including the appropriate family members to test, the process of testing, insurance coverage and turn-around-time for results. We discussed the implications of a negative, positive and/or variant of uncertain significant result.   Diamond Marshall elected to pursue [facility] Custom Panel. The Common Hereditary Cancers Panel offered by [facility] includes sequencing and/or deletion duplication testing of the following 43 genes: APC, ATM, AXIN2, BAP1, BARD1, BMPR1A, BRCA1, BRCA2, BRIP1, CDH1, CDK4, CDKN2A (p14ARF and p16INK4a only), CHEK2, CTNNA1, EPCAM (Deletion/duplication testing only), FH, GREM1 (promoter region duplication testing only), HOXB13, KIT, MBD4, MEN1, MLH1, MSH2, MSH3, MSH6, MUTYH, NF1, NHTL1, PALB2, PDGFRA, PMS2, POLD1, POLE, PTEN, RAD51C, RAD51D, SMAD4, SMARCA4. STK11, TP53, TSC1, TSC2, and VHL.  Based on Diamond Marshall's personal and family history of cancer, she meets medical criteria for  genetic testing. Despite that she meets criteria, she may still have an out of pocket cost. We discussed that if her out of pocket cost for testing is over $100, the laboratory should contact them to discuss self-pay prices, patient pay assistance programs, if applicable, and other billing options.   PLAN: After considering the risks, benefits, and limitations, Diamond Marshall provided informed consent to pursue genetic testing and the blood sample was sent to [facility] for analysis of the Custom Panel (43 genes). Results should be available within approximately 2-3 weeks' time, at which point they will be disclosed by telephone to Diamond Marshall, as will any additional recommendations warranted by these results. Diamond Marshall will receive a summary of her genetic counseling visit and a copy of her results once available. This information will also be available in Epic.   Diamond Marshall questions were answered to her satisfaction today. Our contact information was  provided should additional questions or concerns arise. Thank you for the referral and allowing [place] to share in the care of your patient.   Lucille Passy, MS, [facility] Genetic Counselor [email or web address] (P) [phone]  The patient was seen for a total of 20 minutes in face-to-face genetic counseling.  The patient brought her husband.  Drs. Lindi Adie and/or Burr Medico were available to discuss this case as needed.  _______________________________________________________________________ For Office Staff:  Number of people involved in session: 2 Was an Intern/ student involved with case: no

## 2022-06-21 NOTE — Assessment & Plan Note (Signed)
This is a very pleasant 52 year old perimenopausal female patient with newly diagnosed T3N1M?  ER/PR HER2 positive invasive ductal carcinoma of the right breast referred to breast [place] for additional recommendations.  She arrived to an initial appointment by herself.  She felt this breast lump back in August 2023 however thought that this is likely benign and hence did not pursue a mammogram immediately.  She had her mammogram recently in January when she noticed some skin changes in that area.  She also had some CT neck because of swelling in the left neck which was thought to be reactive lymphadenopathy although she was told that this fatty lipoma.  She also reports some dry cough but attributed this to lisinopril.  Physical examination today noticed large left breast and a quadrant mass measuring almost 6 cm in largest dimension, lateral margin not palpable, palpable regional adenopathy and palpable nonregional left lower cervical/supraclavicular adenopathy.  We have discussed that at this time her nonregional adenopathy is concerning for possible metastatic disease and hence we have to proceed with pet imaging.  We have briefly discussed about the treatment options, curative intent of treatment if it is not metastatic versus noncurative intent of treatment if this is metastatic.  We have also discussed about adverse effects of chemotherapy including but not limited to fatigue, nausea, vomiting, diarrhea, cytopenias, neuropathy, cardiotoxicity etc.  We will proceed with echocardiogram because she will receive Herceptin based therapy despite this stage.  She was understandably in 2 years, was hoping for an earlier stage diagnosis.  She however is agreeable to all the plan we have discussed today. Will see her back in a couple weeks with PET/CT results to discuss further treatment recommendations.  Thank you for consulting [place] in the care of this patient.  Please do not hesitate contact us with any additional  questions or concerns.

## 2022-06-21 NOTE — Progress Notes (Signed)
[facility] NOTE  Patient Care Team: Claretta Fraise, MD as PCP - General (Family Medicine) Erroll Luna, MD as Consulting Physician (General Surgery) Benay Pike, MD as Consulting Physician (Hematology and Oncology) Eppie Gibson, MD as Attending Physician (Radiation Oncology) Mauro Kaufmann, RN as Oncology Nurse Navigator Rockwell Germany, RN as Oncology Nurse Navigator  CHIEF COMPLAINTS/PURPOSE OF CONSULTATION:  Newly diagnosed breast cancer  HISTORY OF PRESENTING ILLNESS:  Diamond Marshall 52 y.o. female is here because of recent diagnosis of left breast IDC  I reviewed her records extensively and collaborated the history with the patient.  SUMMARY OF ONCOLOGIC HISTORY: Oncology History  Malignant neoplasm of upper-inner quadrant of left breast in female, estrogen receptor positive ([place])  06/06/2022 Mammogram   Diagnostic mammogram given palpable mass showed suspicious spiculated left breast mass with surrounding nodularity at 9 o'clock position.  This measures up to 3.1 cm sonographically however due to deep location and patient body habitus the mammographic measurement of 5.8 cm is felt to be more accurate.  At least 2 abnormal lymph nodes in the left axilla.  Possible abnormal palpable left supraclavicular lymph node.   06/06/2022 Breast US   Breast ultrasound confirmed these findings.   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  Prognostic showed ER 65% weak to strong staining, PR 15% weak to moderate staining, Ki-67 of 50% and HER2 positive by IHC at 3+   06/19/2022 Initial Diagnosis   Malignant neoplasm of upper-inner quadrant of left breast in female, estrogen receptor positive ([place])    Diamond Marshall is here for an initial visit with her husband.  She tells me that she felt the breast  lump back in August 2023.  Initially it was painful and wondered if this could be fibrocystic disease so she cut down on her caffeine and this helped.  She did not think much of it after that.  She however then noticed some skin changes in the right breast which made her go to the mammogram.  She reports some dry cough back from August but again wonders if this is related to lisinopril.  She also noticed some swelling in the left neck for which she went to the urgent care and ER back in November and she was told it could be a fatty lipoma.  She had CT back in November in the neck which showed possible reactive lymph nodes and some thickening in the area epiglottic field.  She denies any changes in her voice.  Rest of the pertinent 10 point ROS reviewed and negative  MEDICAL HISTORY:  Past Medical History:  Diagnosis Date   Anemia    b12 deficiency   Anxiety    B12 deficiency    Back pain    Depression    Gallbladder problem    GERD (gastroesophageal reflux disease)    Hypertension    Hypothyroidism    Joint pain    Knee pain    Palpitation    Pernicious anemia    SOB (shortness of breath)    Thyroid disease    hypothyroidism   Vitamin D deficiency     SURGICAL HISTORY: Past Surgical History:  Procedure Laterality Date   BREAST BIOPSY Left 06/09/2022   [place] LT BREAST BX W LOC DEV 1ST LESION IMG BX [place] US GUIDE 06/09/2022 [facility]  CESAREAN SECTION  1999   Dilatation and currettagement     for miscarriage 18 years ago    SOCIAL HISTORY: Social History   Socioeconomic History   Marital status: Married    Spouse name: Ronalee Belts   Number of children: Not on file   Years of education: Not on file   Highest education level: Not on file  Occupational History   Occupation: Adult Water engineer  Tobacco Use   Smoking status: Never   Smokeless tobacco: Never  Vaping Use   Vaping Use: Never used  Substance and Sexual Activity   Alcohol use: No   Drug use: No    Sexual activity: Yes    Birth control/protection: Pill  Other Topics Concern   Not on file  Social History Narrative   Not on file   Social Determinants of Health   Financial Resource Strain: Not on file  Food Insecurity: Not on file  Transportation Needs: Not on file  Physical Activity: Not on file  Stress: Not on file  Social Connections: Not on file  Intimate Partner Violence: Not on file    FAMILY HISTORY: Family History  Problem Relation Age of Onset   Hypertension Mother    Diabetes Mother    High Cholesterol Mother    Thyroid disease Mother    Depression Mother    Anxiety disorder Mother    Obesity Mother    CVA Father        hemorrhagic   Breast cancer Other 12   Ovarian cancer Maternal Aunt    Colon cancer Maternal Aunt    Breast cancer Cousin     ALLERGIES:  is allergic to phentermine, sulfa antibiotics, topiramate, and septra [sulfamethoxazole-trimethoprim].  MEDICATIONS:  Current Outpatient Medications  Medication Sig Dispense Refill   albuterol (VENTOLIN HFA) 108 (90 Base) MCG/ACT inhaler Inhale 2 puffs into the lungs every 6 (six) hours as needed for wheezing or shortness of breath. 8 g 0   benzonatate (TESSALON) 200 MG capsule Take 1 capsule (200 mg total) by mouth 3 (three) times daily as needed. 30 capsule 1   cyanocobalamin (,VITAMIN B-12,) 1000 MCG/ML injection INJECT 1 ML INTRAMUSCULARLY EVERY 15 DAYS 6 mL 1   cyclobenzaprine (FLEXERIL) 10 MG tablet Take 1 tablet (10 mg total) by mouth 3 (three) times daily as needed for muscle spasms. 30 tablet 1   diclofenac (VOLTAREN) 75 MG EC tablet TAKE ONE TABLET TWICE DAILY AS NEEDED. needs appointment FOR refills 60 tablet 0   Dulaglutide (TRULICITY) 1.5 FO/2.7XA SOPN Inject content of one pen under the skin weekly 6 mL 3   EPINEPHrine 0.3 mg/0.3 mL IJ SOAJ injection Inject 0.3 mg into the muscle as needed for anaphylaxis. 2 each 0   escitalopram (LEXAPRO) 20 MG tablet Take 1 tablet (20 mg total) by mouth  daily. 90 tablet 3   levothyroxine (SYNTHROID) 50 MCG tablet Take 1 tablet (50 mcg total) by mouth daily. 90 tablet 3   LORazepam (ATIVAN) 0.5 MG tablet Take 1 tablet (0.5 mg total) by mouth 2 (two) times daily as needed for anxiety. 30 tablet 5   pantoprazole (PROTONIX) 40 MG tablet Take 2 tablets (80 mg total) by mouth daily. 180 tablet 3   predniSONE (STERAPRED UNI-PAK 21 TAB) 10 MG (21) TBPK tablet Use as directed 21 tablet 0   No current facility-administered medications for this visit.    PHYSICAL EXAMINATION: ECOG PERFORMANCE STATUS: 0 - Asymptomatic  Vitals:   06/21/22 0824  BP: (!) 174/105  Pulse: 94  Resp: 17  Temp: (!) 97.5 F (36.4 C)  SpO2: 100%   Filed Weights   06/21/22 0824  Weight: (!) 334 lb (151.5 kg)    GENERAL:alert, no distress and comfortable SKIN: skin color, texture, turgor are normal, no rashes or significant lesions EYES: normal, conjunctiva are pink and non-injected, sclera clear OROPHARYNX:no exudate, no erythema and lips, buccal mucosa, and tongue normal  NECK: supple, thyroid normal size, non-tender, without nodularity LYMPH: Left lower cervical/supraclavicular lymphadenopathy noted.  There are multiple palpable lymph nodes. LUNGS: clear to auscultation and percussion with normal breathing effort HEART: regular rate & rhythm and no murmurs and no lower extremity edema ABDOMEN:abdomen soft, non-tender and normal bowel sounds Musculoskeletal:no cyanosis of digits and no clubbing  PSYCH: alert & oriented x 3 with fluent speech NEURO: no focal motor/sensory deficits BREAST: Large breast mass in the left breast pretty much occupying the entire inner quadrant measuring approximately 6 cm.  The lateral margin could not be palpated, this is likely under the areola/nipple.  Palpable left axillary lymph nodes.  LABORATORY DATA:  I have reviewed the data as listed Lab Results  Component Value Date   WBC 7.4 06/21/2022   HGB 12.0 06/21/2022   HCT 37.9  06/21/2022   MCV 78.6 (L) 06/21/2022   PLT 342 06/21/2022   Lab Results  Component Value Date   NA 140 06/21/2022   K 4.4 06/21/2022   CL 105 06/21/2022   CO2 29 06/21/2022    RADIOGRAPHIC STUDIES: I have personally reviewed the radiological reports and agreed with the findings in the report.  ASSESSMENT AND PLAN:  Malignant neoplasm of upper-inner quadrant of left breast in female, estrogen receptor positive ([place]) This is a very pleasant 52 year old perimenopausal female patient with newly diagnosed T3N1M?  ER/PR HER2 positive invasive ductal carcinoma of the right breast referred to breast [facility] for additional recommendations.  She arrived to an initial appointment by herself.  She felt this breast lump back in August 2023 however thought that this is likely benign and hence did not pursue a mammogram immediately.  She had her mammogram recently in January when she noticed some skin changes in that area.  She also had some CT neck because of swelling in the left neck which was thought to be reactive lymphadenopathy although she was told that this fatty lipoma.  She also reports some dry cough but attributed this to lisinopril.  Physical examination today noticed large left breast and a quadrant mass measuring almost 6 cm in largest dimension, lateral margin not palpable, palpable regional adenopathy and palpable nonregional left lower cervical/supraclavicular adenopathy.  We have discussed that at this time her nonregional adenopathy is concerning for possible metastatic disease and hence we have to proceed with pet imaging.  We have briefly discussed about the treatment options, curative intent of treatment if it is not metastatic versus noncurative intent of treatment if this is metastatic.  We have also discussed about adverse effects of chemotherapy including but not limited to fatigue, nausea, vomiting, diarrhea, cytopenias, neuropathy, cardiotoxicity etc.  We will proceed with echocardiogram  because she will receive Herceptin based therapy despite this stage.  She was understandably in 2 years, was hoping for an earlier stage diagnosis.  She however is agreeable to all the plan we have discussed today. Will see her back in a couple weeks with PET/CT results to discuss further treatment recommendations.  Thank you for consulting [place] in the care of this patient.  Please  do not hesitate contact us with any additional questions or concerns.  Total time spent: 60 minutes including history, physical exam, review of records, counseling and coordination of care All questions were answered. The patient knows to call the clinic with any problems, questions or concerns.    Benay Pike, MD 06/21/22

## 2022-06-21 NOTE — Research (Signed)
[employer] 2021-05 - Specimen Collection Study to Evaluate Biomarkers in Subjects with Cancer   INTRO STUDY/CONSENT  Patient Diamond Marshall was identified by Clinical Research as a potential candidate for the above listed study.  This Clinical Research Nurse met with TAYSHA MAJEWSKI, [id number], on 06/21/22 in a manner and location that ensures patient privacy to discuss participation in the above listed research study.  Patient is Accompanied by spouse Legrand Como .  A copy of the informed consent document with embedded HIPAA language was provided to the patient.  Patient reads, speaks, and understands [place].   Patient was provided with the business card of this Nurse and encouraged to contact the research team with any questions.  Approximately 15 minutes were spent with the patient reviewing the informed consent documents.  Patient was provided the option of taking informed consent documents home to review and was encouraged to review at their convenience with their support network, including other care providers. Patient took the consent documents home to review.  Will f/u with patient on interest next week.  Wells Guiles 'Learta Codding' Neysa Bonito, RN, BSN, [facility] Clinical Research Nurse I 06/21/22 11:58 AM

## 2022-06-22 ENCOUNTER — Telehealth: Payer: Self-pay | Admitting: *Deleted

## 2022-06-22 ENCOUNTER — Encounter: Payer: Self-pay | Admitting: *Deleted

## 2022-06-22 MED ORDER — LORAZEPAM 0.5 MG PO TABS: 0.5000 mg | ORAL_TABLET | Freq: Three times a day (TID) | ORAL | 1 refills | Status: AC | PRN

## 2022-06-22 NOTE — Telephone Encounter (Signed)
Spoke with patient to follow up from [facility]. She is having a lot of anxiety and asked if we could call in something. Informed her we could call in some Ativan. Verified pharmacy.  She is anxious to get her PET but explained to her that I did call her insurance company but there was no other details that they needed but they did escalate the request for PA.  We will let he know as soon as we get authorization. Patient appreciative and verbalized understanding.

## 2022-06-23 ENCOUNTER — Encounter: Payer: Self-pay | Admitting: *Deleted

## 2022-06-23 ENCOUNTER — Other Ambulatory Visit: Payer: Commercial Managed Care - PPO

## 2022-06-26 ENCOUNTER — Telehealth: Payer: Self-pay | Admitting: Radiology

## 2022-06-26 NOTE — Telephone Encounter (Signed)
[employer] 2021-05 - Specimen Collection Study to Evaluate Biomarkers in Subjects with Cancer    06/26/2022  PHONE CALL: Confirmed I was speaking with Diamond Marshall . Informed patient reason for call is to follow-up on the above mentioned study. Patient is interested in participation. Will follow-up after this week so patient can have all necessary imaging. Thanked patient for her time and consideration of the above mentioned study.  Carol Ada, RT(R)(T) Clinical Research Coordinator

## 2022-06-28 NOTE — Pre-Procedure Instructions (Signed)
Surgical Instructions    Your procedure is scheduled on Wednesday 07/05/22 .  Report to [facility] Main Entrance "A" at 2:00 P.M., then check in with the Admitting office.  Call this number if you have problems the morning of surgery:  [phone]   If you have any questions prior to your surgery date call [phone]: Open Monday-Friday 8am-4pm If you experience any cold or flu symptoms such as cough, fever, chills, shortness of breath, etc. between now and your scheduled surgery, please notify us at the above number     Remember:  Do not eat after midnight the night before your surgery  You may drink clear liquids until 1:00 P.M. the morning of your surgery.   Clear liquids allowed are: Water, Non-Citrus Juices (without pulp), Carbonated Beverages, Clear Tea, Black Coffee ONLY (NO MILK, CREAM OR POWDERED CREAMER of any kind), and Gatorade    Take these medicines the morning of surgery with A SIP OF WATER:   escitalopram (LEXAPRO)   pantoprazole (PROTONIX)     Take these medicines if needed:   fluticasone (FLONASE)   LORazepam (ATIVAN)    As of today, STOP taking any Aspirin (unless otherwise instructed by your surgeon) Aleve, Naproxen, Ibuprofen, Motrin, Advil, Goody's, BC's, all herbal medications, fish oil, diclofenac (VOLTAREN), and all vitamins.           Do not wear jewelry or makeup. Do not wear lotions, powders, perfumes/cologne or deodorant. Do not shave 48 hours prior to surgery.  Men may shave face and neck. Do not bring valuables to the hospital. Do not wear nail polish, gel polish, artificial nails, or any other type of covering on natural nails (fingers and toes) If you have artificial nails or gel coating that need to be removed by a nail salon, please have this removed prior to surgery. Artificial nails or gel coating may interfere with anesthesia's ability to adequately monitor your vital signs.  [facility] is not responsible for any belongings or valuables.     Do NOT Smoke (Tobacco/Vaping)  24 hours prior to your procedure  If you use a CPAP at night, you may bring your mask for your overnight stay.   Contacts, glasses, hearing aids, dentures or partials may not be worn into surgery, please bring cases for these belongings   For patients admitted to the hospital, discharge time will be determined by your treatment team.   Patients discharged the day of surgery will not be allowed to drive home, and someone needs to stay with them for 24 hours.   SURGICAL WAITING ROOM VISITATION Patients having surgery or a procedure may have no more than 2 support people in the waiting area - these visitors may rotate.   Children under the age of 19 must have an adult with them who is not the patient. If the patient needs to stay at the hospital during part of their recovery, the visitor guidelines for inpatient rooms apply. Pre-op nurse will coordinate an appropriate time for 1 support person to accompany patient in pre-op.  This support person may not rotate.   Please refer to [email or web address] for the visitor guidelines for Inpatients (after your surgery is over and you are in a regular room).    Special instructions:    Oral Hygiene is also important to reduce your risk of infection.  Remember - BRUSH YOUR TEETH THE MORNING OF SURGERY WITH YOUR REGULAR TOOTHPASTE   [place]- Preparing For Surgery  Before surgery, you can play an  important role. Because skin is not sterile, your skin needs to be as free of germs as possible. You can reduce the number of germs on your skin by washing with CHG (chlorahexidine gluconate) Soap before surgery.  CHG is an antiseptic cleaner which kills germs and bonds with the skin to continue killing germs even after washing.     Please do not use if you have an allergy to CHG or antibacterial soaps. If your skin becomes reddened/irritated stop using the CHG.  Do not  shave (including legs and underarms) for at least 48 hours prior to first CHG shower. It is OK to shave your face.  Please follow these instructions carefully.     Shower the NIGHT BEFORE SURGERY and the MORNING OF SURGERY with CHG Soap.   If you chose to wash your hair, wash your hair first as usual with your normal shampoo. After you shampoo, rinse your hair and body thoroughly to remove the shampoo.  Then [employer] and genitals (private parts) with your normal soap and rinse thoroughly to remove soap.  After that Use CHG Soap as you would any other liquid soap. You can apply CHG directly to the skin and wash gently with a scrungie or a clean washcloth.   Apply the CHG Soap to your body ONLY FROM THE NECK DOWN.  Do not use on open wounds or open sores. Avoid contact with your eyes, ears, mouth and genitals (private parts). Wash Face and genitals (private parts)  with your normal soap.   Wash thoroughly, paying special attention to the area where your surgery will be performed.  Thoroughly rinse your body with warm water from the neck down.  DO NOT shower/wash with your normal soap after using and rinsing off the CHG Soap.  Pat yourself dry with a CLEAN TOWEL.  Wear CLEAN PAJAMAS to bed the night before surgery  Place CLEAN SHEETS on your bed the night before your surgery  DO NOT SLEEP WITH PETS.   Day of Surgery:  Take a shower with CHG soap. Wear Clean/Comfortable clothing the morning of surgery Do not apply any deodorants/lotions.   Remember to brush your teeth WITH YOUR REGULAR TOOTHPASTE.    If you received a COVID test during your pre-op visit, it is requested that you wear a mask when out in public, stay away from anyone that may not be feeling well, and notify your surgeon if you develop symptoms. If you have been in contact with anyone that has tested positive in the last 10 days, please notify your surgeon.    Please read over the following fact sheets that you were  given.

## 2022-06-29 ENCOUNTER — Other Ambulatory Visit: Payer: Self-pay

## 2022-06-29 ENCOUNTER — Encounter (HOSPITAL_COMMUNITY)
Admission: RE | Admit: 2022-06-29 | Discharge: 2022-06-29 | Disposition: A | Discharge status: Home / Self Care | Payer: Commercial Managed Care - PPO | Source: Ambulatory Visit | Attending: Hematology and Oncology | Admitting: Hematology and Oncology

## 2022-06-29 ENCOUNTER — Ambulatory Visit (HOSPITAL_COMMUNITY)
Admission: RE | Admit: 2022-06-29 | Discharge: 2022-06-29 | Disposition: A | Discharge status: Home / Self Care | Payer: Commercial Managed Care - PPO | Source: Ambulatory Visit | Attending: Hematology and Oncology | Admitting: Hematology and Oncology

## 2022-06-29 ENCOUNTER — Telehealth: Payer: Self-pay | Admitting: Hematology and Oncology

## 2022-06-29 ENCOUNTER — Inpatient Hospital Stay: Payer: Commercial Managed Care - PPO

## 2022-06-29 ENCOUNTER — Telehealth: Payer: Self-pay | Admitting: Genetic Counselor

## 2022-06-29 ENCOUNTER — Encounter (HOSPITAL_COMMUNITY): Payer: Self-pay

## 2022-06-29 ENCOUNTER — Encounter: Payer: Self-pay | Admitting: Genetic Counselor

## 2022-06-29 DIAGNOSIS — K449 Diaphragmatic hernia without obstruction or gangrene: Secondary | ICD-10-CM | POA: Insufficient documentation

## 2022-06-29 DIAGNOSIS — Z0189 Encounter for other specified special examinations: Secondary | ICD-10-CM

## 2022-06-29 DIAGNOSIS — I700 Atherosclerosis of aorta: Secondary | ICD-10-CM | POA: Diagnosis not present

## 2022-06-29 DIAGNOSIS — Z170 Estrogen receptor positive status [ER+]: Secondary | ICD-10-CM | POA: Diagnosis not present

## 2022-06-29 DIAGNOSIS — K219 Gastro-esophageal reflux disease without esophagitis: Secondary | ICD-10-CM | POA: Diagnosis not present

## 2022-06-29 DIAGNOSIS — C50212 Malignant neoplasm of upper-inner quadrant of left female breast: Secondary | ICD-10-CM | POA: Diagnosis not present

## 2022-06-29 DIAGNOSIS — N6320 Unspecified lump in the left breast, unspecified quadrant: Secondary | ICD-10-CM | POA: Diagnosis not present

## 2022-06-29 DIAGNOSIS — I10 Essential (primary) hypertension: Secondary | ICD-10-CM | POA: Diagnosis present

## 2022-06-29 DIAGNOSIS — Z1379 Encounter for other screening for genetic and chromosomal anomalies: Secondary | ICD-10-CM | POA: Insufficient documentation

## 2022-06-29 DIAGNOSIS — Z6841 Body Mass Index (BMI) 40.0 and over, adult: Secondary | ICD-10-CM | POA: Diagnosis not present

## 2022-06-29 DIAGNOSIS — E039 Hypothyroidism, unspecified: Secondary | ICD-10-CM | POA: Diagnosis not present

## 2022-06-29 HISTORY — DX: Malignant (primary) neoplasm, unspecified: C80.1

## 2022-06-29 HISTORY — DX: Unspecified abdominal hernia without obstruction or gangrene: K46.9

## 2022-06-29 HISTORY — DX: Personal history of other diseases of the digestive system: Z87.19

## 2022-06-29 HISTORY — DX: Unspecified osteoarthritis, unspecified site: M19.90

## 2022-06-29 HISTORY — DX: Family history of other specified conditions: Z84.89

## 2022-06-29 LAB — GLUCOSE, CAPILLARY: Glucose-Capillary: 100 mg/dL — ABNORMAL HIGH (ref 70–99)

## 2022-06-29 LAB — ECHOCARDIOGRAM COMPLETE
Area-P 1/2: 3.76 cm2
Calc EF: 66 %
Single Plane A2C EF: 70.1 %
Single Plane A4C EF: 61.4 %

## 2022-06-29 MED ORDER — FLUDEOXYGLUCOSE F - 18 (FDG) INJECTION
16.0000 | Freq: Once | INTRAVENOUS | Status: AC
  Administered 2022-06-29: 15.92 via INTRAVENOUS

## 2022-06-29 NOTE — Progress Notes (Signed)
   06/29/22 1522  OBSTRUCTIVE SLEEP APNEA  Have you ever been diagnosed with sleep apnea through a sleep study? No  Do you snore loudly (loud enough to be heard through closed doors)?  1  Do you often feel tired, fatigued, or sleepy during the daytime (such as falling asleep during driving or talking to someone)? 1  Has anyone observed you stop breathing during your sleep? 0  Do you have, or are you being treated for high blood pressure? 1  BMI more than 35 kg/m2? 1  Age > 50 (1-yes) 1  Neck circumference greater than:Female 16 inches or larger, Female 17inches or larger? 1  Female Gender (Yes=1) 0  Obstructive Sleep Apnea Score 6  Score 5 or greater  Results sent to PCP

## 2022-06-29 NOTE — Progress Notes (Signed)
Echocardiogram 2D Echocardiogram has been performed.  Frances Furbish 06/29/2022, 1:31 PM

## 2022-06-29 NOTE — Telephone Encounter (Signed)
Spoke with patient husband confirming upcoming appointment

## 2022-06-29 NOTE — Progress Notes (Signed)
PCP - Claretta Fraise Cardiologist - denies  PPM/ICD - denies   Chest x-ray - N/A EKG - 06/29/2022  Stress Test - denies ECHO - 06/29/2022  Cardiac Cath - denies  Sleep Study - denies- stop bang 6- routed to PCP   Fasting Blood Sugar - N/A   Last dose of GLP1 agonist-  N/A   Blood Thinner Instructions: N/A Aspirin Instructions:N/A  ERAS Protcol - ERAS + ensure   COVID TEST- N/A   Anesthesia review: yes, review EKG and PET scan.   Pt BP elevated at PAT 185/95. Pt reports she took her lisinopril around 1pm. Pt re-check 138/95. Pt reports she has not been taking her BP at home, but the last time she did it was "high". Pt instructed to monitor BP at home and notify PCP if she sees elevated BP at home.   Patient denies shortness of breath, fever, cough and chest pain at PAT appointment   All instructions explained to the patient, with a verbal understanding of the material. Patient agrees to go over the instructions while at home for a better understanding. The opportunity to ask questions was provided.

## 2022-06-29 NOTE — Telephone Encounter (Signed)
I contacted Ms. Mcpartlin to discuss her genetic testing results. No pathogenic variants were identified in the 43 genes analyzed. Detailed clinic note to follow.  The test report has been scanned into EPIC and is located under the Molecular Pathology section of the Results Review tab.  A portion of the result report is included below for reference.   Lucille Passy, MS, [facility] Genetic Counselor [email or web address] (P) [phone]

## 2022-06-30 ENCOUNTER — Ambulatory Visit: Payer: Self-pay | Admitting: Genetic Counselor

## 2022-06-30 ENCOUNTER — Encounter: Payer: Self-pay | Admitting: Genetic Counselor

## 2022-06-30 ENCOUNTER — Telehealth: Payer: Self-pay | Admitting: Radiology

## 2022-06-30 DIAGNOSIS — Z1379 Encounter for other screening for genetic and chromosomal anomalies: Secondary | ICD-10-CM

## 2022-06-30 NOTE — Anesthesia Preprocedure Evaluation (Signed)
Anesthesia Evaluation  Patient identified by MRN, date of birth, ID band Patient awake    Reviewed: Allergy & Precautions, H&P , NPO status , Patient's Chart, lab work & pertinent test results  Airway Mallampati: II  TM Distance: >3 FB Neck ROM: Full    Dental no notable dental hx.    Pulmonary neg pulmonary ROS   Pulmonary exam normal breath sounds clear to auscultation       Cardiovascular hypertension, Pt. on medications negative cardio ROS Normal cardiovascular exam Rhythm:Regular Rate:Normal     Neuro/Psych  Headaches  Anxiety Depression     negative psych ROS   GI/Hepatic Neg liver ROS,GERD  ,,  Endo/Other  Hypothyroidism  Morbid obesity  Renal/GU negative Renal ROS  negative genitourinary   Musculoskeletal  (+) Arthritis , Osteoarthritis,    Abdominal  (+) + obese  Peds negative pediatric ROS (+)  Hematology  (+) Blood dyscrasia, anemia   Anesthesia Other Findings   Reproductive/Obstetrics negative OB ROS                             Anesthesia Physical Anesthesia Plan  ASA: 3  Anesthesia Plan: General   Post-op Pain Management: Tylenol PO (pre-op)*, Gabapentin PO (pre-op)* and Minimal or no pain anticipated   Induction: Intravenous  PONV Risk Score and Plan: 3 and Ondansetron, Dexamethasone, Midazolam and Treatment may vary due to age or medical condition  Airway Management Planned: LMA  Additional Equipment:   Intra-op Plan:   Post-operative Plan: Extubation in OR  Informed Consent: I have reviewed the patients History and Physical, chart, labs and discussed the procedure including the risks, benefits and alternatives for the proposed anesthesia with the patient or authorized representative who has indicated his/her understanding and acceptance.     Dental advisory given  Plan Discussed with: CRNA  Anesthesia Plan Comments: (PAT note written 06/30/2022 by Myra Gianotti, PA-C.  )       Anesthesia Quick Evaluation

## 2022-06-30 NOTE — Progress Notes (Signed)
HPI:   Diamond Marshall was previously seen in the [facility] clinic due to a personal and family history of cancer and concerns regarding a hereditary predisposition to cancer. Please refer to our prior cancer genetics clinic note for more information regarding our discussion, assessment and recommendations, at the time. Diamond Marshall recent genetic test results were disclosed to her, as were recommendations warranted by these results. These results and recommendations are discussed in more detail below.  CANCER HISTORY:  Oncology History  Malignant neoplasm of upper-inner quadrant of left breast in female, estrogen receptor positive ([facility])  06/06/2022 Mammogram   Diagnostic mammogram given palpable mass showed suspicious spiculated left breast mass with surrounding nodularity at 9 o'clock position.  This measures up to 3.1 cm sonographically however due to deep location and patient body habitus the mammographic measurement of 5.8 cm is felt to be more accurate.  At least 2 abnormal lymph nodes in the left axilla.  Possible abnormal palpable left supraclavicular lymph node.   06/06/2022 Breast US   Breast ultrasound confirmed these findings.   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  Prognostic showed ER 65% weak to strong staining, PR 15% weak to moderate staining, Ki-67 of 50% and HER2 positive by IHC at 3+   06/19/2022 Initial Diagnosis   Malignant neoplasm of upper-inner quadrant of left breast in female, estrogen receptor positive (HCC)    Genetic Testing   [facility] Custom Panel+RNA was Negative. Report date is 06/28/2022.   The Custom Hereditary Cancers Panel offered by [facility] includes sequencing and/or deletion duplication testing of the following 43 genes: APC, ATM, AXIN2, BAP1, BARD1, BMPR1A, BRCA1, BRCA2, BRIP1,  CDH1, CDK4, CDKN2A (p14ARF and p16INK4a only), CHEK2, CTNNA1, EPCAM (Deletion/duplication testing only), FH, GREM1 (promoter region duplication testing only), HOXB13, KIT, MBD4, MEN1, MLH1, MSH2, MSH3, MSH6, MUTYH, NF1, NHTL1, PALB2, PDGFRA, PMS2, POLD1, POLE, PTEN, RAD51C, RAD51D, SMAD4, SMARCA4. STK11, TP53, TSC1, TSC2, and VHL.      FAMILY HISTORY:  We obtained a detailed, 4-generation family history.  Significant diagnoses are listed below:      Family History  Problem Relation Age of Onset   Hypertension Mother     Diabetes Mother     High Cholesterol Mother     Thyroid disease Mother     Depression Mother     Anxiety disorder Mother     Obesity Mother     CVA Father          hemorrhagic   Ovarian cancer Maternal Aunt 25   Colon cancer Maternal Aunt     Breast cancer Cousin 55        maternal first cousin, reports negative genetic testing       Diamond Marshall has five maternal aunts. One maternal aunt has a history of ovarian cancer diagnosed at age 48 and a second maternal aunt has a history of colon cancer diagnosed at an unknown age. One of Diamond Marshall's maternal first cousins (daughter of an unaffected aunt) was diagnosed with breast cancer at age 1 and reportedly had negative hereditary cancer genetic testing. Of note, she does not have any information about her paternal family medical history. There is no reported Ashkenazi Jewish ancestry.   GENETIC TEST RESULTS:  The [facility] Custom Cancer Panel found no pathogenic mutations.  The Common Hereditary Cancers Panel offered  by [facility] includes sequencing and/or deletion duplication testing of the following 43 genes: APC, ATM, AXIN2, BAP1, BARD1, BMPR1A, BRCA1, BRCA2, BRIP1, CDH1, CDK4, CDKN2A (p14ARF and p16INK4a only), CHEK2, CTNNA1, EPCAM (Deletion/duplication testing only), FH, GREM1 (promoter region duplication testing only), HOXB13, KIT, MBD4, MEN1, MLH1, MSH2, MSH3, MSH6, MUTYH, NF1, NHTL1, PALB2, PDGFRA, PMS2, POLD1, POLE, PTEN,  RAD51C, RAD51D, SMAD4, SMARCA4. STK11, TP53, TSC1, TSC2, and VHL.  The test report has been scanned into EPIC and is located under the Molecular Pathology section of the Results Review tab.  A portion of the result report is included below for reference. Genetic testing reported out on 06/28/2022.      Even though a pathogenic variant was not identified, possible explanations for the cancer in the family may include: There may be no hereditary risk for cancer in the family. The cancers in Diamond Marshall and/or her family may be due to other genetic or environmental factors. There may be a gene mutation in one of these genes that current testing methods cannot detect, but that chance is small. There could be another gene that has not yet been discovered, or that we have not yet tested, that is responsible for the cancer diagnoses in the family.  It is also possible there is a hereditary cause for the cancer in the family that Diamond Marshall did not inherit.  Therefore, it is important to remain in touch with cancer genetics in the future so that we can continue to offer Diamond Marshall the most up to date genetic testing.   ADDITIONAL GENETIC TESTING:  We discussed with Diamond Marshall that her genetic testing was fairly extensive.  If there are genes identified to increase cancer risk that can be analyzed in the future, we would be happy to discuss and coordinate this testing at that time.    CANCER SCREENING RECOMMENDATIONS:  Diamond Marshall test result is considered negative (normal).  This means that we have not identified a hereditary cause for her personal and family history of cancer at this time. An individual's cancer risk and medical management are not determined by genetic test results alone. Overall cancer risk assessment incorporates additional factors, including personal medical history, family history, and any available genetic information that may result in a personalized plan for cancer prevention and surveillance.  Therefore, it is recommended she continue to follow the cancer management and screening guidelines provided by her oncology and primary healthcare provider.  RECOMMENDATIONS FOR FAMILY MEMBERS:   Since she did not inherit a mutation in a cancer predisposition gene included on this panel, her daughter could not have inherited a mutation from her in one of these genes. Individuals in this family might be at some increased risk of developing cancer, over the general population risk, due to the family history of cancer. We recommend women in this family have a yearly mammogram beginning at age 83, or 34 years younger than the earliest onset of cancer, an annual clinical breast exam, and perform monthly breast self-exams.  FOLLOW-UP:  Cancer genetics is a rapidly advancing field and it is possible that new genetic tests will be appropriate for her and/or her family members in the future. We encouraged her to remain in contact with cancer genetics on an annual basis so we can update her personal and family histories and let her know of advances in cancer genetics that may benefit this family.   Our contact number was provided. Ms. Radi questions were answered to her satisfaction, and she knows she  is welcome to call us at anytime with additional questions or concerns.   Lucille Passy, MS, [facility] Genetic Counselor [email or web address] (P) [phone]

## 2022-06-30 NOTE — Telephone Encounter (Signed)
[employer] 2021-05 - Specimen Collection Study to Evaluate Biomarkers in Subjects with Cancer    06/30/2022  PHONE CALL: Confirmed I was speaking with Diamond Marshall. Informed patient reason for call is to follow-up on the above mentioned study and potentially complete consent and labs on Monday 2/12 while patient is in clinic. Patient is interested in participation, but would be unable to stay after her appt due to work. This coordinator expressed understanding and plans to meet with patient during her appt to see if we can coordinate a time that works best for her. Thanked patient for her time and support of the above mentioned study.   Carol Ada, RT(R)(T) Clinical Research Coordinator

## 2022-06-30 NOTE — Progress Notes (Signed)
Anesthesia Chart Review:  Case: [id number] Date/Time: 07/05/22 1545   Procedure: INSERTION PORT-A-CATH   Anesthesia type: General   Pre-op diagnosis: POOR VENOUS ACCESS   Location: [facility] OR ROOM 02 / [place] OR   Surgeons: Erroll Luna, MD       DISCUSSION: Patient is a 52 year old female scheduled for the above procedure.    History includes never smoker, HTN, hypothyroidism, GERD, hiatal hernia, pernicious anemia, palpitations, dyspnea, breast cancer (left breast biopsy: IDC + left axillary LN 06/09/22), morbid obesity.  OSA screening score of 6.  EKG stable. 06/29/22 echo (pre-chemo) showed LVEF 60-65%, no regional wall motion abnormalities, grade 1 diastolic dysfunction, normal RV systolic function, no significant valvular disease.  She denies chest pain and shortness of breath at PAT RN visit.  Anesthesia team to evaluate on the day of surgery.   VS: BP (!) 138/93 Comment: pt took lisinopril at 1pm  Pulse (!) 103   Temp 36.9 C   Resp 18   Ht 5' 3"$  (1.6 m)   Wt (!) 151 kg   LMP  (Within Months) Comment: LMP July 2023  SpO2 96%   BMI 58.99 kg/m    PROVIDERS: Claretta Fraise, MD is PCP  Benay Pike, MD is HEM-ONC Eppie Gibson, MD is RAD-ONC   LABS: Last labs in [facility] include: Lab Results  Component Value Date   WBC 7.4 06/21/2022   HGB 12.0 06/21/2022   HCT 37.9 06/21/2022   PLT 342 06/21/2022   GLUCOSE 94 06/21/2022   ALT 18 06/21/2022   AST 22 06/21/2022   NA 140 06/21/2022   K 4.4 06/21/2022   CL 105 06/21/2022   CREATININE 0.81 06/21/2022   BUN 15 06/21/2022   CO2 29 06/21/2022   TSH 4.410 07/21/2021   HGBA1C 5.7 (H) 07/21/2021    IMAGES: PET Scan 06/29/22: IMPRESSION: 1. Hypermetabolic left breast mass, compatible with primary breast malignancy. 2. Hypermetabolic left cervical, axillary, subpectoral, supraclavicular and prevascular mediastinal lymph nodes, compatible with nodal metastatic disease. 3. No evidence of metastatic disease in the abdomen or  pelvis. 4. Aortic Atherosclerosis (ICD10-I70.0).   CT Soft tissue neck 03/29/22 ([facility]): IMPRESSION:  - There is some soft tissue thickening seen along the left aryepiglottic fold. This can be seen with inflammatory/infectious etiology, but can also be seen with a subtle mass.  There are mildly prominent cervical lymph nodes which may be reactive in  - No nature. focal rim-enhancing fluid collection is seen.  - The visualized cervical, vertebral, and jugular vasculature appear patent.    EKG: 06/29/22:  Normal sinus rhythm Inferior infarct , age undetermined Cannot rule out Anterior infarct , age undetermined Abnormal ECG When compared with ECG of 27-Oct-2019 22:04, PREVIOUS ECG IS PRESENT No significant change since last tracing Confirmed by Shelva Majestic [phone]) on 06/29/2022 2:35:02 PM   CV: Echo 06/29/22: IMPRESSIONS   1. Technically difficuly study due to poor echo windows, Definity  contrast was not given   2. Left ventricular ejection fraction, by estimation, is 60 to 65%. The  left ventricle has normal function. The left ventricle has no regional  wall motion abnormalities. Left ventricular diastolic parameters are  consistent with Grade I diastolic  dysfunction (impaired relaxation).   3. Right ventricular systolic function is normal. The right ventricular  size is normal.   4. The mitral valve is grossly normal. No evidence of mitral valve  regurgitation.   5. The aortic valve was not well visualized. Aortic valve regurgitation  is not  visualized. No aortic stenosis is present.    Past Medical History:  Diagnosis Date   Abdominal hernia    Anemia    b12 deficiency   Anxiety    Arthritis    B12 deficiency    Back pain    Cancer (HCC)    breast cancer   Depression    Family history of adverse reaction to anesthesia    mother vomits after anesthesia   Gallbladder problem    GERD (gastroesophageal reflux disease)    History of hiatal hernia    "small" per  patient   Hypertension    Hypothyroidism    Joint pain    Knee pain    Palpitation    "when iron is low"   Pernicious anemia    SOB (shortness of breath)    Thyroid disease    hypothyroidism   Vitamin D deficiency     Past Surgical History:  Procedure Laterality Date   BREAST BIOPSY Left 06/09/2022   [place] LT BREAST BX W LOC DEV 1ST LESION IMG BX SPEC US GUIDE 06/09/2022 [facility] MAMMOGRAPHY   CESAREAN SECTION  1999   Dilatation and currettagement     for miscarriage 18 years ago    MEDICATIONS:  cyanocobalamin (,VITAMIN B-12,) 1000 MCG/ML injection   cyclobenzaprine (FLEXERIL) 10 MG tablet   diclofenac (VOLTAREN) 75 MG EC tablet   EPINEPHrine 0.3 mg/0.3 mL IJ SOAJ injection   escitalopram (LEXAPRO) 20 MG tablet   fluticasone (FLONASE) 50 MCG/ACT nasal spray   lisinopril (ZESTRIL) 10 MG tablet   LORazepam (ATIVAN) 0.5 MG tablet   LORazepam (ATIVAN) 0.5 MG tablet   pantoprazole (PROTONIX) 40 MG tablet   No current facility-administered medications for this encounter.    Myra Gianotti, PA-C Surgical Short Stay/Anesthesiology [facility] Phone [phone] [facility] Phone [phone] 06/30/2022 4:00 PM

## 2022-07-03 ENCOUNTER — Encounter: Payer: Self-pay | Admitting: *Deleted

## 2022-07-03 ENCOUNTER — Encounter: Payer: Self-pay | Admitting: Hematology and Oncology

## 2022-07-03 ENCOUNTER — Inpatient Hospital Stay: Payer: Commercial Managed Care - PPO | Attending: Hematology and Oncology | Admitting: Hematology and Oncology

## 2022-07-03 ENCOUNTER — Telehealth: Payer: Self-pay

## 2022-07-03 VITALS — BP 136/62 | HR 93 | Temp 97.9°F | Resp 18 | Ht 63.0 in | Wt 329.3 lb

## 2022-07-03 DIAGNOSIS — Z5111 Encounter for antineoplastic chemotherapy: Secondary | ICD-10-CM | POA: Insufficient documentation

## 2022-07-03 DIAGNOSIS — C50212 Malignant neoplasm of upper-inner quadrant of left female breast: Secondary | ICD-10-CM

## 2022-07-03 DIAGNOSIS — R5383 Other fatigue: Secondary | ICD-10-CM | POA: Diagnosis not present

## 2022-07-03 DIAGNOSIS — Z170 Estrogen receptor positive status [ER+]: Secondary | ICD-10-CM

## 2022-07-03 DIAGNOSIS — R059 Cough, unspecified: Secondary | ICD-10-CM | POA: Diagnosis not present

## 2022-07-03 DIAGNOSIS — R112 Nausea with vomiting, unspecified: Secondary | ICD-10-CM | POA: Insufficient documentation

## 2022-07-03 DIAGNOSIS — Z5112 Encounter for antineoplastic immunotherapy: Secondary | ICD-10-CM | POA: Diagnosis present

## 2022-07-03 DIAGNOSIS — R197 Diarrhea, unspecified: Secondary | ICD-10-CM | POA: Diagnosis not present

## 2022-07-03 DIAGNOSIS — R42 Dizziness and giddiness: Secondary | ICD-10-CM | POA: Insufficient documentation

## 2022-07-03 DIAGNOSIS — R591 Generalized enlarged lymph nodes: Secondary | ICD-10-CM | POA: Diagnosis not present

## 2022-07-03 DIAGNOSIS — E860 Dehydration: Secondary | ICD-10-CM | POA: Insufficient documentation

## 2022-07-03 DIAGNOSIS — Z5189 Encounter for other specified aftercare: Secondary | ICD-10-CM | POA: Diagnosis not present

## 2022-07-03 DIAGNOSIS — T451X5A Adverse effect of antineoplastic and immunosuppressive drugs, initial encounter: Secondary | ICD-10-CM | POA: Diagnosis not present

## 2022-07-03 DIAGNOSIS — I959 Hypotension, unspecified: Secondary | ICD-10-CM | POA: Diagnosis not present

## 2022-07-03 MED ORDER — PROCHLORPERAZINE MALEATE 10 MG PO TABS: 10.0000 mg | ORAL_TABLET | Freq: Four times a day (QID) | ORAL | 1 refills | Status: DC | PRN

## 2022-07-03 MED ORDER — LIDOCAINE-PRILOCAINE 2.5-2.5 % EX CREA: TOPICAL_CREAM | CUTANEOUS | 3 refills | Status: DC

## 2022-07-03 MED ORDER — ONDANSETRON HCL 8 MG PO TABS: 8.0000 mg | ORAL_TABLET | Freq: Three times a day (TID) | ORAL | 1 refills | Status: DC | PRN

## 2022-07-03 MED ORDER — DEXAMETHASONE 4 MG PO TABS: ORAL_TABLET | ORAL | 1 refills | Status: DC

## 2022-07-03 NOTE — Assessment & Plan Note (Addendum)
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 She felt this breast lump back in August 2023 however thought that this is likely benign and hence did not pursue a mammogram immediately.  She had her mammogram recently in January when she noticed some skin changes in that area.  She also had some CT neck because of swelling in the left neck which was thought to be reactive lymphadenopathy although she was told that this fatty lipoma.  She also reports some dry cough but attributed this to lisinopril.  Physical examination today noticed large left breast and a quadrant mass measuring almost 6 cm in largest dimension, lateral margin not palpable, palpable regional adenopathy and palpable nonregional left lower cervical/supraclavicular adenopathy.  She is here to discuss PET scan results. PET unfortunately showed mediastinal adenopathy and non regional cervical adenopathy consistent with met disease. Chemo class scheduled. We will start THP with palliative intent. I once again discussed that the intent of treatment is palliative however she may have great response and in some cases long lasting remission. She is understandably tearful and upset about this whole situation but she is motivated to start treatment. Treatment plan placed.  Benay Pike MD

## 2022-07-03 NOTE — Telephone Encounter (Signed)
Notified Patient of prior authorization approval for Ondansetron 1m Tablets. Medication is approved through 12/30/2022. No other needs or concerns voiced at this time.

## 2022-07-03 NOTE — Telephone Encounter (Signed)
Error Entered Twice

## 2022-07-03 NOTE — Progress Notes (Signed)
[facility] NOTE  Patient Care Team: Claretta Fraise, MD as PCP - General (Family Medicine) Erroll Luna, MD as Consulting Physician (General Surgery) Benay Pike, MD as Consulting Physician (Hematology and Oncology) Eppie Gibson, MD as Attending Physician (Radiation Oncology) Mauro Kaufmann, RN as Oncology Nurse Navigator Rockwell Germany, RN as Oncology Nurse Navigator  CHIEF COMPLAINTS/PURPOSE OF CONSULTATION:  Newly diagnosed breast cancer  HISTORY OF PRESENTING ILLNESS:  Diamond Marshall 52 y.o. female is here because of recent diagnosis of left breast IDC  I reviewed her records extensively and collaborated the history with the patient.  SUMMARY OF ONCOLOGIC HISTORY: Oncology History  Malignant neoplasm of upper-inner quadrant of left breast in female, estrogen receptor positive ([place])  06/06/2022 Mammogram   Diagnostic mammogram given palpable mass showed suspicious spiculated left breast mass with surrounding nodularity at 9 o'clock position.  This measures up to 3.1 cm sonographically however due to deep location and patient body habitus the mammographic measurement of 5.8 cm is felt to be more accurate.  At least 2 abnormal lymph nodes in the left axilla.  Possible abnormal palpable left supraclavicular lymph node.   06/06/2022 Breast US   Breast ultrasound confirmed these findings.   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  Prognostic showed ER 65% weak to strong staining, PR 15% weak to moderate staining, Ki-67 of 50% and HER2 positive by IHC at 3+   06/19/2022 Initial Diagnosis   Malignant neoplasm of upper-inner quadrant of left breast in female, estrogen receptor positive (HCC)    Genetic Testing   [facility] Custom Panel+RNA was Negative. Report date is 06/28/2022.   The  Custom Hereditary Cancers Panel offered by [facility] includes sequencing and/or deletion duplication testing of the following 43 genes: APC, ATM, AXIN2, BAP1, BARD1, BMPR1A, BRCA1, BRCA2, BRIP1, CDH1, CDK4, CDKN2A (p14ARF and p16INK4a only), CHEK2, CTNNA1, EPCAM (Deletion/duplication testing only), FH, GREM1 (promoter region duplication testing only), HOXB13, KIT, MBD4, MEN1, MLH1, MSH2, MSH3, MSH6, MUTYH, NF1, NHTL1, PALB2, PDGFRA, PMS2, POLD1, POLE, PTEN, RAD51C, RAD51D, SMAD4, SMARCA4. STK11, TP53, TSC1, TSC2, and VHL.    07/06/2022 -  Chemotherapy   Patient is on Treatment Plan : BREAST DOCEtaxel + Trastuzumab + Pertuzumab (THP) q21d x 8 cycles / Trastuzumab + Pertuzumab q21d x 4 cycles      [name] is here for a follow up after PET scan results.   MEDICAL HISTORY:  Past Medical History:  Diagnosis Date   Abdominal hernia    Anemia    b12 deficiency   Anxiety    Arthritis    B12 deficiency    Back pain    Cancer (HCC)    breast cancer   Depression    Family history of adverse reaction to anesthesia    mother vomits after anesthesia   Gallbladder problem    GERD (gastroesophageal reflux disease)    History of hiatal hernia    "small" per patient   Hypertension    Hypothyroidism    Joint pain    Knee pain    Palpitation    "when iron is low"   Pernicious anemia    SOB (shortness of breath)    Thyroid disease    hypothyroidism   Vitamin D deficiency     SURGICAL HISTORY: Past Surgical History:  Procedure Laterality Date  BREAST BIOPSY Left 06/09/2022   [place] LT BREAST BX W LOC DEV 1ST LESION IMG BX SPEC US GUIDE 06/09/2022 [facility]   CESAREAN SECTION  1999   Dilatation and currettagement     for miscarriage 18 years ago    SOCIAL HISTORY: Social History   Socioeconomic History   Marital status: Married    Spouse name: Diamond Marshall   Number of children: Not on file   Years of education: Not on file   Highest education level: Not on file  Occupational History    Occupation: Adult Water engineer  Tobacco Use   Smoking status: Never   Smokeless tobacco: Never  Vaping Use   Vaping Use: Never used  Substance and Sexual Activity   Alcohol use: No   Drug use: No   Sexual activity: Yes    Birth control/protection: Pill  Other Topics Concern   Not on file  Social History Narrative   Not on file   Social Determinants of Health   Financial Resource Strain: Not on file  Food Insecurity: No Food Insecurity (06/21/2022)   Hunger Vital Sign    Worried About Running Out of Food in the Last Year: Never true    Ran Out of Food in the Last Year: Never true  Transportation Needs: No Transportation Needs (06/21/2022)   PRAPARE - Hydrologist (Medical): No    Lack of Transportation (Non-Medical): No  Physical Activity: Not on file  Stress: Not on file  Social Connections: Not on file  Intimate Partner Violence: Not on file    FAMILY HISTORY: Family History  Problem Relation Age of Onset   Hypertension Mother    Diabetes Mother    High Cholesterol Mother    Thyroid disease Mother    Depression Mother    Anxiety disorder Mother    Obesity Mother    CVA Father        hemorrhagic   Ovarian cancer Maternal Aunt 70   Colon cancer Maternal Aunt    Breast cancer Cousin 89       maternal first cousin, reports negative genetic testing    ALLERGIES:  is allergic to misc. sulfonamide containing compounds, phentermine, bee venom, topiramate, septra [sulfamethoxazole-trimethoprim], and sulfa antibiotics.  MEDICATIONS:  Current Outpatient Medications  Medication Sig Dispense Refill   cyanocobalamin (,VITAMIN B-12,) 1000 MCG/ML injection INJECT 1 ML INTRAMUSCULARLY EVERY 15 DAYS 6 mL 1   cyclobenzaprine (FLEXERIL) 10 MG tablet Take 1 tablet (10 mg total) by mouth 3 (three) times daily as needed for muscle spasms. (Patient not taking: Reported on 06/27/2022) 30 tablet 1   diclofenac (VOLTAREN) 75 MG EC tablet TAKE ONE TABLET  TWICE DAILY AS NEEDED. needs appointment FOR refills 60 tablet 0   EPINEPHrine 0.3 mg/0.3 mL IJ SOAJ injection Inject 0.3 mg into the muscle as needed for anaphylaxis. 2 each 0   escitalopram (LEXAPRO) 20 MG tablet Take 1 tablet (20 mg total) by mouth daily. 90 tablet 3   fluticasone (FLONASE) 50 MCG/ACT nasal spray Place 1 spray into both nostrils daily.     lisinopril (ZESTRIL) 10 MG tablet Take 10 mg by mouth daily.     LORazepam (ATIVAN) 0.5 MG tablet Take 1 tablet (0.5 mg total) by mouth 2 (two) times daily as needed for anxiety. (Patient not taking: Reported on 06/27/2022) 30 tablet 5   LORazepam (ATIVAN) 0.5 MG tablet Take 1 tablet (0.5 mg total) by mouth every 8 (eight) hours as needed for  anxiety. 30 tablet 1   pantoprazole (PROTONIX) 40 MG tablet Take 2 tablets (80 mg total) by mouth daily. (Patient taking differently: Take 40 mg by mouth daily.) 180 tablet 3   No current facility-administered medications for this visit.    PHYSICAL EXAMINATION: ECOG PERFORMANCE STATUS: 0 - Asymptomatic  Vitals:   07/03/22 0835  BP: 136/62  Pulse: 93  Resp: 18  Temp: 97.9 F (36.6 C)  SpO2: 99%    Filed Weights   07/03/22 0835  Weight: (!) 329 lb 4.8 oz (149.4 kg)     GENERAL:alert, no distress and comfortable SKIN: skin color, texture, turgor are normal, no rashes or significant lesions EYES: normal, conjunctiva are pink and non-injected, sclera clear OROPHARYNX:no exudate, no erythema and lips, buccal mucosa, and tongue normal  NECK: supple, thyroid normal size, non-tender, without nodularity LYMPH: Left lower cervical/supraclavicular lymphadenopathy noted.  There are multiple palpable lymph nodes. LUNGS: clear to auscultation and percussion with normal breathing effort HEART: regular rate & rhythm and no murmurs and no lower extremity edema ABDOMEN:abdomen soft, non-tender and normal bowel sounds Musculoskeletal:no cyanosis of digits and no clubbing  PSYCH: alert & oriented x 3  with fluent speech NEURO: no focal motor/sensory deficits BREAST: Large breast mass in the left breast pretty much occupying the entire inner quadrant measuring approximately 6 cm.  The lateral margin could not be palpated, this is likely under the areola/nipple.  Palpable left axillary lymph nodes.  LABORATORY DATA:  I have reviewed the data as listed Lab Results  Component Value Date   WBC 7.4 06/21/2022   HGB 12.0 06/21/2022   HCT 37.9 06/21/2022   MCV 78.6 (L) 06/21/2022   PLT 342 06/21/2022   Lab Results  Component Value Date   NA 140 06/21/2022   K 4.4 06/21/2022   CL 105 06/21/2022   CO2 29 06/21/2022    RADIOGRAPHIC STUDIES: I have personally reviewed the radiological reports and agreed with the findings in the report.  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 She felt this breast lump back in August 2023 however thought that this is likely benign and hence did not pursue a mammogram immediately.  She had her mammogram recently in January when she noticed some skin changes in that area.  She also had some CT neck because of swelling in the left neck which was thought to be reactive lymphadenopathy although she was told that this fatty lipoma.  She also reports some dry cough but attributed this to lisinopril.  Physical examination today noticed large left breast and a quadrant mass measuring almost 6 cm in largest dimension, lateral margin not palpable, palpable regional adenopathy and palpable nonregional left lower cervical/supraclavicular adenopathy.  She is here to discuss PET scan results. PET unfortunately showed mediastinal adenopathy and non regional cervical adenopathy  consistent with met disease. Chemo class scheduled. We will start THP with palliative intent. I once again discussed that the intent of treatment is palliative however she may have great response and in some cases long lasting remission. She is understandably tearful and upset about this whole situation but she is motivated to start treatment. Treatment plan placed.  Benay Pike MD   Total time spent:40 minutes including history, physical exam, review of records,  counseling and coordination of care All questions were answered. The patient knows to call the clinic with any problems, questions or concerns.    Benay Pike, MD 07/03/22

## 2022-07-03 NOTE — Progress Notes (Signed)
START ON PATHWAY REGIMEN - Breast ? ? ?  Cycle 1: A cycle is 21 days: ?    Pertuzumab  ?    Trastuzumab-xxxx  ?    Docetaxel  ?  Cycles 2 and beyond: A cycle is every 21 days: ?    Pertuzumab  ?    Trastuzumab-xxxx  ?    Docetaxel  ? ?**Always confirm dose/schedule in your pharmacy ordering system** ? ?Patient Characteristics: ?Distant Metastases or Locoregional Recurrent Disease - Unresected or Locally Advanced Unresectable Disease Progressing after Neoadjuvant and Local Therapies, HER2 Positive, ER Positive, Chemotherapy + HER2-Targeted Therapy, First Line ?Therapeutic Status: Distant Metastases ?HER2 Status: Positive (+) ?ER Status: Positive (+) ?PR Status: Positive (+) ?Line of Therapy: First Line ?Intent of Therapy: ?Non-Curative / Palliative Intent, Discussed with Patient ?

## 2022-07-04 ENCOUNTER — Other Ambulatory Visit: Payer: Self-pay | Admitting: *Deleted

## 2022-07-04 ENCOUNTER — Other Ambulatory Visit: Payer: Self-pay

## 2022-07-04 ENCOUNTER — Inpatient Hospital Stay: Payer: Commercial Managed Care - PPO | Admitting: Pharmacist

## 2022-07-04 ENCOUNTER — Encounter: Payer: Self-pay | Admitting: Radiology

## 2022-07-04 ENCOUNTER — Inpatient Hospital Stay: Payer: Commercial Managed Care - PPO

## 2022-07-04 ENCOUNTER — Inpatient Hospital Stay: Payer: Commercial Managed Care - PPO | Admitting: Radiology

## 2022-07-04 VITALS — BP 137/68 | HR 91 | Temp 98.5°F | Resp 18 | Ht 63.0 in | Wt 326.7 lb

## 2022-07-04 DIAGNOSIS — Z170 Estrogen receptor positive status [ER+]: Secondary | ICD-10-CM

## 2022-07-04 DIAGNOSIS — Z5112 Encounter for antineoplastic immunotherapy: Secondary | ICD-10-CM | POA: Diagnosis not present

## 2022-07-04 LAB — CMP (CANCER CENTER ONLY)
ALT: 17 U/L (ref 0–44)
AST: 20 U/L (ref 15–41)
Albumin: 4.2 g/dL (ref 3.5–5.0)
Alkaline Phosphatase: 90 U/L (ref 38–126)
Anion gap: 10 (ref 5–15)
BUN: 14 mg/dL (ref 6–20)
CO2: 26 mmol/L (ref 22–32)
Calcium: 9.8 mg/dL (ref 8.9–10.3)
Chloride: 102 mmol/L (ref 98–111)
Creatinine: 0.74 mg/dL (ref 0.44–1.00)
GFR, Estimated: >60 mL/min (ref >60)
Glucose, Bld: 85 mg/dL (ref 70–99)
Potassium: 3.8 mmol/L (ref 3.5–5.1)
Sodium: 138 mmol/L (ref 135–145)
Total Bilirubin: 0.4 mg/dL (ref 0.3–1.2)
Total Protein: 7.7 g/dL (ref 6.5–8.1)

## 2022-07-04 LAB — CBC WITH DIFFERENTIAL (CANCER CENTER ONLY)
Abs Immature Granulocytes: 0.02 10*3/uL (ref 0.00–0.07)
Basophils Absolute: 0.1 10*3/uL (ref 0.0–0.1)
Basophils Relative: 1 %
Eosinophils Absolute: 0.1 10*3/uL (ref 0.0–0.5)
Eosinophils Relative: 2 %
HCT: 37.4 % (ref 36.0–46.0)
Hemoglobin: 12 g/dL (ref 12.0–15.0)
Immature Granulocytes: 0 %
Lymphocytes Relative: 29 %
Lymphs Abs: 2.4 10*3/uL (ref 0.7–4.0)
MCH: 24.6 pg — ABNORMAL LOW (ref 26.0–34.0)
MCHC: 32.1 g/dL (ref 30.0–36.0)
MCV: 76.6 fL — ABNORMAL LOW (ref 80.0–100.0)
Monocytes Absolute: 0.4 10*3/uL (ref 0.1–1.0)
Monocytes Relative: 5 %
Neutro Abs: 5.3 10*3/uL (ref 1.7–7.7)
Neutrophils Relative %: 63 %
Platelet Count: 370 10*3/uL (ref 150–400)
RBC: 4.88 MIL/uL (ref 3.87–5.11)
RDW: 16 % — ABNORMAL HIGH (ref 11.5–15.5)
WBC Count: 8.4 10*3/uL (ref 4.0–10.5)
nRBC: 0 % (ref 0.0–0.2)

## 2022-07-04 LAB — RESEARCH LABS

## 2022-07-04 NOTE — Progress Notes (Signed)
[facility]       Telephone: [phone]?Fax: [phone]   Oncology Clinical Pharmacist Practitioner Initial Assessment  Diamond Marshall is a 52 y.o. female with a diagnosis of breast cancer. They were contacted today via in-person visit. She is accompanied by her husband.  Indication/Regimen THP: Trastuzumab (Herceptin) and pertuzumab (Perjeta) and docetaxel (Taxotere) are being used appropriately for treatment of breast cancer by Dr. Benay Pike.      Wt Readings from Last 1 Encounters:  07/04/22 (!) 326 lb 11.2 oz (148.2 kg)    Estimated body surface area is 2.57 meters squared as calculated from the following:   Height as of this encounter: 5' 3"$  (1.6 m).   Weight as of this encounter: 326 lb 11.2 oz (148.2 kg).  The dosing regimen is every 21 days for 8 cycles  Trastuzumab (8 mg/kg load, 6 mg/kg maintenance) on Day 1 Pertuzumab (840 mg load, 420 mg maintenance) on Day 1 Docetaxel (75 mg/m2) on Day 1  Followed by the below dosing regimen every 21 days until progression or unacceptable toxicity  Trastuzumab (6 mg/kg) on Day 1 Pertuzumab (420 mg) on Day 1  Dose Modifications None at this time   Allergies Allergies  Allergen Reactions   Misc. Sulfonamide Containing Compounds Rash and Itching   Phentermine     Became suicidal on the medication   Bee Venom Swelling   Peanut Allergen Powder-Dnfp Other (See Comments)    Migraine   Topiramate Other (See Comments)    dizziness   Septra [Sulfamethoxazole-Trimethoprim] Rash   Sulfa Antibiotics Rash    Vitals    07/04/2022    2:06 PM 07/03/2022    8:35 AM 06/29/2022    3:37 PM  Oncology Vitals  Height 160 cm 160 cm   Weight 148.19 kg 149.37 kg   Weight (lbs) 326 lbs 11 oz 329 lbs 5 oz   BMI 57.87 kg/m2   57.87 kg/m2 58.33 kg/m2   58.33 kg/m2   Temp 98.5 F (36.9 C) 97.9 F (36.6 C)   Pulse Rate 91 93   BP 137/68 136/62 138/93  Resp 18 18   SpO2 98 % 99 %   BSA (m2) 2.57 m2   2.57 m2  2.58 m2   2.58 m2      Laboratory Data    Latest Ref Rng & Units 07/04/2022    1:21 PM 06/21/2022    8:06 AM 07/21/2021    3:11 PM  CBC EXTENDED  WBC 4.0 - 10.5 K/uL 8.4  7.4  7.1   RBC 3.87 - 5.11 MIL/uL 4.88  4.82  4.95   Hemoglobin 12.0 - 15.0 g/dL 12.0  12.0  12.0   HCT 36.0 - 46.0 % 37.4  37.9  37.6   Platelets 150 - 400 K/uL 370  342  359   NEUT# 1.7 - 7.7 K/uL 5.3  4.7  4.4   Lymph# 0.7 - 4.0 K/uL 2.4  1.9  1.9        Latest Ref Rng & Units 07/04/2022    1:21 PM 06/21/2022    8:06 AM 03/28/2022    8:11 AM  CMP  Glucose 70 - 99 mg/dL 85  94  90   BUN 6 - 20 mg/dL 14  15  14   $ Creatinine 0.44 - 1.00 mg/dL 0.74  0.81  0.92   Sodium 135 - 145 mmol/L 138  140  139   Potassium 3.5 - 5.1 mmol/L 3.8  4.4  4.6   Chloride 98 - 111 mmol/L 102  105  102   CO2 22 - 32 mmol/L 26  29  22   $ Calcium 8.9 - 10.3 mg/dL 9.8  9.5  9.4   Total Protein 6.5 - 8.1 g/dL 7.7  7.6  7.1   Total Bilirubin 0.3 - 1.2 mg/dL 0.4  0.3  0.2   Alkaline Phos 38 - 126 U/L 90  89  125   AST 15 - 41 U/L 20  22  27   $ ALT 0 - 44 U/L 17  18  24    $ Contraindications Contraindications were reviewed? Yes Contraindications to therapy were identified? No   Safety Precautions (written information also provided) The following safety precautions for the use of THP were reviewed:  Decreased white blood cells (WBCs) and increased risk for infection Fever: reviewed the importance of having a thermometer and the Centers for Disease Control and Prevention (CDC) definition of fever which is 100.44F (38C) or higher. Patient should call 24/7 triage at [phone] if experiencing a fever or any other symptoms Diarrhea Hair Loss Nausea or vomiting Fatigue Rash or itchy skin Peripheral neuropathy: numbness or tingling in hands and feet Mouth sores Nail Changes Fluid retention or swelling (edema) Muscle or joint pain or weakness Cardiotoxicity from trastuzumab Infusion reactions Pneumonitis from trastuzumab Docetaxel  irritating veins Liver toxicity from docetaxel Docetaxel can cause eye pain, blurred vision, tearing, and light sensitivity Handling body fluids and waste Intimacy, sexual activity, contraception, fertility  Medication Reconciliation Current Outpatient Medications  Medication Sig Dispense Refill   cyanocobalamin (,VITAMIN B-12,) 1000 MCG/ML injection INJECT 1 ML INTRAMUSCULARLY EVERY 15 DAYS 6 mL 1   escitalopram (LEXAPRO) 20 MG tablet Take 1 tablet (20 mg total) by mouth daily. 90 tablet 3   lisinopril (ZESTRIL) 10 MG tablet Take 10 mg by mouth daily.     LORazepam (ATIVAN) 0.5 MG tablet Take 1 tablet (0.5 mg total) by mouth every 8 (eight) hours as needed for anxiety. 30 tablet 1   pantoprazole (PROTONIX) 40 MG tablet Take 2 tablets (80 mg total) by mouth daily. (Patient taking differently: Take 40 mg by mouth daily.) 180 tablet 3   dexamethasone (DECADRON) 4 MG tablet Take 2 tabs by mouth 2 times daily starting day before chemo. Then take 2 tabs daily for 2 days starting day after chemo. Take with food. (Patient not taking: Reported on 07/04/2022) 30 tablet 1   diclofenac (VOLTAREN) 75 MG EC tablet TAKE ONE TABLET TWICE DAILY AS NEEDED. needs appointment FOR refills (Patient not taking: Reported on 07/04/2022) 60 tablet 0   EPINEPHrine 0.3 mg/0.3 mL IJ SOAJ injection Inject 0.3 mg into the muscle as needed for anaphylaxis. (Patient not taking: Reported on 07/04/2022) 2 each 0   fluticasone (FLONASE) 50 MCG/ACT nasal spray Place 1 spray into both nostrils daily. (Patient not taking: Reported on 07/04/2022)     lidocaine-prilocaine (EMLA) cream Apply to affected area once (Patient not taking: Reported on 07/04/2022) 30 g 3   ondansetron (ZOFRAN) 8 MG tablet Take 1 tablet (8 mg total) by mouth every 8 (eight) hours as needed for nausea or vomiting. (Patient not taking: Reported on 07/04/2022) 30 tablet 1   prochlorperazine (COMPAZINE) 10 MG tablet Take 1 tablet (10 mg total) by mouth every 6 (six)  hours as needed for nausea or vomiting. (Patient not taking: Reported on 07/04/2022) 30 tablet 1   No current facility-administered medications for this visit.   Medication reconciliation is based  on the patient's most recent medication list in the electronic medical record (EMR) including herbal products and OTC medications.   The patient's medication list was reviewed today with the patient? Yes   Drug-drug interactions (DDIs) DDIs were evaluated? Yes Significant DDIs identified?  We did review increased bleeding risk with escitalopram and diclofenac. She will hold diclofenac while receiving docetaxel portion of treatment regimen  Drug-Food Interactions Drug-food interactions were evaluated? Yes Drug-food interactions identified? No   Follow-up Plan  Treatment start date: 07/06/22 Port placement date: 07/05/22 ECHO date: 06/29/22 Reviewed prescriptions, premedications, chemotherapy regimen, and sequencing. Also reviewed possible side effects and management strategies. She may be increasing her lisinopril dose in the near future Labs were drawn today so not needed for C1D1 if given 07/06/22 She lives closer to [facility] and asked if she may be able to get treated there. Will forward to Dr. Chryl Heck to see if that is possible. Clinical pharmacy will assist Dr. Benay Pike and Darnell Level on an as needed basis going forward  Darnell Level participated in the discussion, expressed understanding, and voiced agreement with the above plan. All questions were answered to her satisfaction. The patient was advised to contact the clinic at [phone] with any questions or concerns prior to her return visit.   I spent 60 minutes assessing the patient.  Raina Mina, RPH-CPP, 07/04/2022 3:01 PM  **Disclaimer: This note was dictated with voice recognition software. Similar sounding words can inadvertently be transcribed and this note may contain transcription errors which may not have been  corrected upon publication of note.**

## 2022-07-04 NOTE — Research (Addendum)
[employer] 2021-05 - Specimen Collection Study to Evaluate Biomarkers in Subjects with Cancer    07/04/2022  ELIGIBILITY:  This Coordinator has reviewed this patient's inclusion and exclusion criteria and confirmed Diamond Marshall is eligible for study participation.  Patient will continue with enrollment.  Menopausal status (women only): Diamond Marshall is post-menopausal. LMP July 2023.   Eligibility confirmed by treating investigator, who also agrees that patient should proceed with enrollment.   CONSENT: Patient was previously provided consent documentation.   Patient Diamond Marshall was identified by Dr. Chryl Heck as a potential candidate for the above listed study.  This Clinical Research Coordinator met with Diamond Marshall, [id number] on 07/04/22 in a manner and location that ensures patient privacy to discuss participation in the above listed research study.  Patient is Accompanied by her husband .  Patient was previously provided with informed consent documents.  Patient has not yet read the informed consent documents and so documents were reviewed page by page today.  As outlined in the informed consent form, this Coordinator and DONTAVIA KUZIA discussed the purpose of the research study, the investigational nature of the study, study procedures and requirements for study participation, potential risks and benefits of study participation, as well as alternatives to participation.  This study is not blinded or double-blinded. The patient understands participation is voluntary and they may withdraw from study participation at any time.  This study does not involve randomization.  This study does not involve an investigational drug or device. This study does not involve a placebo. Patient understands enrollment is pending full eligibility review.   Confidentiality and how the patient's information will be used as part of study participation were discussed.  Patient was informed there is  reimbursement provided for their time and effort spent on trial participation.  The patient is encouraged to discuss research study participation with their insurance provider to determine what costs they may incur as part of study participation, including research related injury.    All questions were answered to patient's satisfaction.  The informed consent with embedded HIPAA language was reviewed page by page.  The patient's mental and emotional status is appropriate to provide informed consent, and the patient verbalizes an understanding of study participation.  Patient has agreed to participate in the above listed research study and has voluntarily signed the informed consent version 04 Jun 2020 with embedded HIPAA language, version 04 Jun 2020  on 07/04/22 at 110PM.  The patient was provided with a copy of the signed informed consent form with embedded HIPAA language for their reference.  No study specific procedures were obtained prior to the signing of the informed consent document.  Approximately 25 minutes were spent with the patient reviewing the informed consent documents.  Patient was not requested to complete a Release of Information form.   After consent documentation was obtained, medical history was obtained as follows:   Medical History:  High Blood Pressure  YES Coronary Artery Disease No Lupus    No Rheumatoid Arthritis  No Diabetes   No      Lynch Syndrome  No  Is the patient currently taking a magnesium supplement?   No  Does the patient have a personal history of cancer (greater than 5 years ago)?  No  Does the patient have a family history of cancer in 1st or 2nd degree relatives? Yes If yes, Relationship(s) and Cancer type(s)? Maternal cousin: breast (33); maternal aunt: ovarian (55s); aunt: colon  Does the  patient have history of alcohol consumption? Yes   If yes, current or former? former If former, year stopped? 2023 Number of years? 34 yrs (patient stated had  first drink around 45 yrs of age) Drinks per week? Less than 1 drink per week (3 drinks a year)  Does the patient have history of cigarette, cigar, pipe, or chewing tobacco use?  No   After medical history was obtained, patient was escorted to the lab by Farris Has, [place] Assistant.  Blood Collection: Research blood obtained by [facility] venipuncture. Patient tolerated well without any adverse events. Gift Card: $50 gift card given to patient for her participation in this study.  Patient and her husband were thanked for their time and patient was thanked for her participation in the above mentioned study.   Carol Ada, RT(R)(T) Clinical Research Coordinator

## 2022-07-04 NOTE — Research (Signed)
[employer] 2021-05 - Specimen Collection Study to Evaluate Biomarkers in Subjects with Cancer   This Nurse has reviewed this patient's inclusion and exclusion criteria as a second review and confirms Diamond Marshall is eligible for study participation.  Patient may continue with enrollment.  Foye Spurling, BSN, RN, [place] Nurse II [phone] 07/04/2022

## 2022-07-05 ENCOUNTER — Ambulatory Visit (HOSPITAL_BASED_OUTPATIENT_CLINIC_OR_DEPARTMENT_OTHER): Payer: Commercial Managed Care - PPO | Admitting: Certified Registered Nurse Anesthetist

## 2022-07-05 ENCOUNTER — Ambulatory Visit (HOSPITAL_COMMUNITY)
Admission: RE | Admit: 2022-07-05 | Discharge: 2022-07-05 | Disposition: A | Discharge status: Home / Self Care | Payer: Commercial Managed Care - PPO | Attending: Surgery | Admitting: Surgery

## 2022-07-05 ENCOUNTER — Telehealth: Payer: Self-pay

## 2022-07-05 ENCOUNTER — Other Ambulatory Visit: Payer: Commercial Managed Care - PPO

## 2022-07-05 ENCOUNTER — Ambulatory Visit (HOSPITAL_COMMUNITY): Payer: Commercial Managed Care - PPO

## 2022-07-05 ENCOUNTER — Other Ambulatory Visit: Payer: Self-pay

## 2022-07-05 ENCOUNTER — Encounter (HOSPITAL_COMMUNITY)
Admission: RE | Disposition: A | Discharge status: Home / Self Care | Payer: Self-pay | Source: Home / Self Care | Attending: Surgery

## 2022-07-05 ENCOUNTER — Ambulatory Visit (HOSPITAL_COMMUNITY): Payer: Commercial Managed Care - PPO | Admitting: Vascular Surgery

## 2022-07-05 ENCOUNTER — Encounter (HOSPITAL_COMMUNITY): Payer: Self-pay | Admitting: Surgery

## 2022-07-05 DIAGNOSIS — Z452 Encounter for adjustment and management of vascular access device: Secondary | ICD-10-CM

## 2022-07-05 DIAGNOSIS — Z6841 Body Mass Index (BMI) 40.0 and over, adult: Secondary | ICD-10-CM | POA: Insufficient documentation

## 2022-07-05 DIAGNOSIS — K219 Gastro-esophageal reflux disease without esophagitis: Secondary | ICD-10-CM | POA: Insufficient documentation

## 2022-07-05 DIAGNOSIS — C50212 Malignant neoplasm of upper-inner quadrant of left female breast: Secondary | ICD-10-CM | POA: Diagnosis present

## 2022-07-05 DIAGNOSIS — I10 Essential (primary) hypertension: Secondary | ICD-10-CM

## 2022-07-05 DIAGNOSIS — F419 Anxiety disorder, unspecified: Secondary | ICD-10-CM | POA: Diagnosis not present

## 2022-07-05 DIAGNOSIS — E039 Hypothyroidism, unspecified: Secondary | ICD-10-CM | POA: Insufficient documentation

## 2022-07-05 DIAGNOSIS — Z170 Estrogen receptor positive status [ER+]: Secondary | ICD-10-CM | POA: Diagnosis not present

## 2022-07-05 DIAGNOSIS — F32A Depression, unspecified: Secondary | ICD-10-CM | POA: Insufficient documentation

## 2022-07-05 DIAGNOSIS — Z79899 Other long term (current) drug therapy: Secondary | ICD-10-CM | POA: Insufficient documentation

## 2022-07-05 DIAGNOSIS — F418 Other specified anxiety disorders: Secondary | ICD-10-CM | POA: Diagnosis not present

## 2022-07-05 HISTORY — PX: PORTACATH PLACEMENT: SHX2246

## 2022-07-05 SURGERY — INSERTION, TUNNELED CENTRAL VENOUS DEVICE, WITH PORT
Anesthesia: General | Site: Chest | Laterality: Right

## 2022-07-05 MED ORDER — BUPIVACAINE HCL 0.25 % IJ SOLN
INTRAMUSCULAR | Status: DC | PRN
  Administered 2022-07-05: 10 mL

## 2022-07-05 MED ORDER — PROPOFOL 10 MG/ML IV BOLUS
INTRAVENOUS | Status: AC
  Filled 2022-07-05: qty 20

## 2022-07-05 MED ORDER — CHLORHEXIDINE GLUCONATE 0.12 % MT SOLN
15.0000 mL | Freq: Once | OROMUCOSAL | Status: AC
  Administered 2022-07-05: 15 mL via OROMUCOSAL
  Filled 2022-07-05: qty 15

## 2022-07-05 MED ORDER — HEPARIN SOD (PORK) LOCK FLUSH 100 UNIT/ML IV SOLN
INTRAVENOUS | Status: AC
  Filled 2022-07-05: qty 5

## 2022-07-05 MED ORDER — HEPARIN SOD (PORK) LOCK FLUSH 100 UNIT/ML IV SOLN
INTRAVENOUS | Status: DC | PRN
  Administered 2022-07-05: 500 [IU]

## 2022-07-05 MED ORDER — OXYCODONE HCL 5 MG PO TABS: 5.0000 mg | ORAL_TABLET | Freq: Four times a day (QID) | ORAL | 0 refills | Status: DC | PRN

## 2022-07-05 MED ORDER — OXYCODONE HCL 5 MG PO TABS
ORAL_TABLET | ORAL | Status: AC
  Filled 2022-07-05: qty 1

## 2022-07-05 MED ORDER — AMISULPRIDE (ANTIEMETIC) 5 MG/2ML IV SOLN: 10.0000 mg | Freq: Once | INTRAVENOUS | Status: DC | PRN

## 2022-07-05 MED ORDER — HYDROMORPHONE HCL 1 MG/ML IJ SOLN
INTRAMUSCULAR | Status: AC
  Filled 2022-07-05: qty 1

## 2022-07-05 MED ORDER — 0.9 % SODIUM CHLORIDE (POUR BTL) OPTIME
TOPICAL | Status: DC | PRN
  Administered 2022-07-05: 1000 mL

## 2022-07-05 MED ORDER — BUPIVACAINE HCL (PF) 0.25 % IJ SOLN
INTRAMUSCULAR | Status: AC
  Filled 2022-07-05: qty 30

## 2022-07-05 MED ORDER — PROMETHAZINE HCL 25 MG/ML IJ SOLN: 6.2500 mg | INTRAMUSCULAR | Status: DC | PRN

## 2022-07-05 MED ORDER — CHLORHEXIDINE GLUCONATE CLOTH 2 % EX PADS: 6.0000 | MEDICATED_PAD | Freq: Once | CUTANEOUS | Status: DC

## 2022-07-05 MED ORDER — ACETAMINOPHEN 500 MG PO TABS
1000.0000 mg | ORAL_TABLET | ORAL | Status: AC
  Administered 2022-07-05: 1000 mg via ORAL
  Filled 2022-07-05: qty 2

## 2022-07-05 MED ORDER — MIDAZOLAM HCL 2 MG/2ML IJ SOLN
INTRAMUSCULAR | Status: AC
  Filled 2022-07-05: qty 2

## 2022-07-05 MED ORDER — HYDROMORPHONE HCL 1 MG/ML IJ SOLN
0.2500 mg | INTRAMUSCULAR | Status: DC | PRN
  Administered 2022-07-05: 0.5 mg via INTRAVENOUS

## 2022-07-05 MED ORDER — MEPERIDINE HCL 25 MG/ML IJ SOLN: 6.2500 mg | INTRAMUSCULAR | Status: DC | PRN

## 2022-07-05 MED ORDER — MIDAZOLAM HCL 2 MG/2ML IJ SOLN
INTRAMUSCULAR | Status: DC | PRN
  Administered 2022-07-05: 2 mg via INTRAVENOUS

## 2022-07-05 MED ORDER — LACTATED RINGERS IV SOLN: INTRAVENOUS | Status: DC

## 2022-07-05 MED ORDER — HEPARIN 6000 UNIT IRRIGATION SOLUTION
Status: DC | PRN
  Administered 2022-07-05: 1

## 2022-07-05 MED ORDER — LIDOCAINE 2% (20 MG/ML) 5 ML SYRINGE
INTRAMUSCULAR | Status: DC | PRN
  Administered 2022-07-05: 60 mg via INTRAVENOUS

## 2022-07-05 MED ORDER — DEXAMETHASONE SODIUM PHOSPHATE 10 MG/ML IJ SOLN
INTRAMUSCULAR | Status: AC
  Filled 2022-07-05: qty 1

## 2022-07-05 MED ORDER — OXYCODONE HCL 5 MG PO TABS
5.0000 mg | ORAL_TABLET | Freq: Once | ORAL | Status: AC | PRN
  Administered 2022-07-05: 5 mg via ORAL

## 2022-07-05 MED ORDER — OXYCODONE HCL 5 MG/5ML PO SOLN: 5.0000 mg | Freq: Once | ORAL | Status: AC | PRN

## 2022-07-05 MED ORDER — ORAL CARE MOUTH RINSE: 15.0000 mL | Freq: Once | OROMUCOSAL | Status: AC

## 2022-07-05 MED ORDER — FENTANYL CITRATE (PF) 250 MCG/5ML IJ SOLN
INTRAMUSCULAR | Status: AC
  Filled 2022-07-05: qty 5

## 2022-07-05 MED ORDER — EPHEDRINE SULFATE-NACL 50-0.9 MG/10ML-% IV SOSY
PREFILLED_SYRINGE | INTRAVENOUS | Status: DC | PRN
  Administered 2022-07-05: 10 mg via INTRAVENOUS
  Administered 2022-07-05 (×2): 5 mg via INTRAVENOUS

## 2022-07-05 MED ORDER — PROPOFOL 10 MG/ML IV BOLUS
INTRAVENOUS | Status: DC | PRN
  Administered 2022-07-05: 200 mg via INTRAVENOUS
  Administered 2022-07-05: 100 mg via INTRAVENOUS

## 2022-07-05 MED ORDER — PHENYLEPHRINE 80 MCG/ML (10ML) SYRINGE FOR IV PUSH (FOR BLOOD PRESSURE SUPPORT)
PREFILLED_SYRINGE | INTRAVENOUS | Status: DC | PRN
  Administered 2022-07-05 (×4): 160 ug via INTRAVENOUS

## 2022-07-05 MED ORDER — ONDANSETRON HCL 4 MG/2ML IJ SOLN
INTRAMUSCULAR | Status: DC | PRN
  Administered 2022-07-05: 4 mg via INTRAVENOUS

## 2022-07-05 MED ORDER — HEPARIN 6000 UNIT IRRIGATION SOLUTION
Status: AC
  Filled 2022-07-05: qty 500

## 2022-07-05 MED ORDER — FENTANYL CITRATE (PF) 250 MCG/5ML IJ SOLN
INTRAMUSCULAR | Status: DC | PRN
  Administered 2022-07-05: 50 ug via INTRAVENOUS

## 2022-07-05 MED ORDER — CEFAZOLIN IN SODIUM CHLORIDE 3-0.9 GM/100ML-% IV SOLN
3.0000 g | INTRAVENOUS | Status: AC
  Administered 2022-07-05: 3 g via INTRAVENOUS
  Filled 2022-07-05: qty 100

## 2022-07-05 MED ORDER — DEXAMETHASONE SODIUM PHOSPHATE 10 MG/ML IJ SOLN
INTRAMUSCULAR | Status: DC | PRN
  Administered 2022-07-05: 10 mg via INTRAVENOUS

## 2022-07-05 MED ORDER — ONDANSETRON HCL 4 MG/2ML IJ SOLN
INTRAMUSCULAR | Status: AC
  Filled 2022-07-05: qty 2

## 2022-07-05 MED ORDER — GABAPENTIN 300 MG PO CAPS
300.0000 mg | ORAL_CAPSULE | ORAL | Status: AC
  Administered 2022-07-05: 300 mg via ORAL
  Filled 2022-07-05: qty 1

## 2022-07-05 MED FILL — Dexamethasone Sodium Phosphate Inj 100 MG/10ML: INTRAMUSCULAR | Qty: 1 | Status: AC

## 2022-07-05 SURGICAL SUPPLY — 34 items
ADH SKN CLS APL DERMABOND .7 (GAUZE/BANDAGES/DRESSINGS) ×1
APL PRP STRL LF DISP 70% ISPRP (MISCELLANEOUS) ×1
BAG COUNTER SPONGE SURGICOUNT (BAG) ×2 IMPLANT
BAG DECANTER FOR FLEXI CONT (MISCELLANEOUS) ×2 IMPLANT
BAG SPNG CNTER NS LX DISP (BAG) ×1
CHLORAPREP W/TINT 26 (MISCELLANEOUS) ×2 IMPLANT
COVER SURGICAL LIGHT HANDLE (MISCELLANEOUS) ×2 IMPLANT
COVER TRANSDUCER ULTRASND GEL (DISPOSABLE) ×2 IMPLANT
DERMABOND ADVANCED .7 DNX12 (GAUZE/BANDAGES/DRESSINGS) ×2 IMPLANT
DRAPE C-ARM 42X120 X-RAY (DRAPES) ×2 IMPLANT
ELECT CAUTERY BLADE 6.4 (BLADE) ×2 IMPLANT
ELECT REM PT RETURN 9FT ADLT (ELECTROSURGICAL) ×1
ELECTRODE REM PT RTRN 9FT ADLT (ELECTROSURGICAL) ×2 IMPLANT
GAUZE 4X4 16PLY ~~LOC~~+RFID DBL (SPONGE) ×2 IMPLANT
GLOVE BIO SURGEON STRL SZ8 (GLOVE) ×2 IMPLANT
GLOVE BIOGEL PI IND STRL 8 (GLOVE) ×2 IMPLANT
GOWN STRL REUS W/ TWL LRG LVL3 (GOWN DISPOSABLE) ×2 IMPLANT
GOWN STRL REUS W/ TWL XL LVL3 (GOWN DISPOSABLE) ×2 IMPLANT
GOWN STRL REUS W/TWL LRG LVL3 (GOWN DISPOSABLE) ×1
GOWN STRL REUS W/TWL XL LVL3 (GOWN DISPOSABLE) ×1
KIT BASIN OR (CUSTOM PROCEDURE TRAY) ×2 IMPLANT
KIT PORT POWER 8FR ISP CVUE (Port) IMPLANT
KIT TURNOVER KIT B (KITS) ×2 IMPLANT
NS IRRIG 1000ML POUR BTL (IV SOLUTION) ×2 IMPLANT
PAD ARMBOARD 7.5X6 YLW CONV (MISCELLANEOUS) ×2 IMPLANT
PENCIL BUTTON HOLSTER BLD 10FT (ELECTRODE) ×2 IMPLANT
POSITIONER HEAD DONUT 9IN (MISCELLANEOUS) ×2 IMPLANT
SUT MNCRL AB 4-0 PS2 18 (SUTURE) ×2 IMPLANT
SUT PROLENE 2 0 SH 30 (SUTURE) ×2 IMPLANT
SUT VIC AB 3-0 SH 27 (SUTURE) ×1
SUT VIC AB 3-0 SH 27X BRD (SUTURE) ×2 IMPLANT
SYR 5ML LUER SLIP (SYRINGE) ×2 IMPLANT
TOWEL GREEN STERILE (TOWEL DISPOSABLE) ×2 IMPLANT
TRAY LAPAROSCOPIC MC (CUSTOM PROCEDURE TRAY) ×2 IMPLANT

## 2022-07-05 NOTE — Transfer of Care (Signed)
Immediate Anesthesia Transfer of Care Note  Patient: AI BREAULT  Procedure(s) Performed: INSERTION PORT-A-CATH (Right: Chest)  Patient Location: PACU  Anesthesia Type:General  Level of Consciousness: awake, drowsy, and patient cooperative  Airway & Oxygen Therapy: Patient Spontanous Breathing  Post-op Assessment: Report given to RN and Post -op Vital signs reviewed and stable  Post vital signs: Reviewed and stable  Last Vitals:  Vitals Value Taken Time  BP    Temp 36.7 C 07/05/22 1650  Pulse 82 07/05/22 1652  Resp 20 07/05/22 1652  SpO2 95 % 07/05/22 1652  Vitals shown include unvalidated device data.  Last Pain:  Vitals:   07/05/22 1411  TempSrc: Oral  PainSc: 0-No pain         Complications: No notable events documented.

## 2022-07-05 NOTE — Op Note (Signed)
Preoperative diagnosis: PAC needed for chemotherapy   Postoperative diagnosis: Same  Procedure: Portacath Placement with C arm and U/S guidance   Surgeon: Turner Daniels, MD, FACS  Anesthesia: General and 0.25 % marcaine with epinephrine  Clinical History and Indications: The patient is getting ready to begin chemotherapy for her cancer. She  needs a Port-A-Cath for venous access. Risk of bleeding, infection,  Collapse lung,  Death,  DVT,  Organ injury,  Mediastinal injury,  Injury to heart,  Injury to blood vessels,  Nerves,  Migration of catheter,  Embolization of catheter and the need for more surgery.  Description of Procedure: I have seen the patient in the holding area and confirmed the plans for the procedure as noted above. I reviewed the risks and complications again and the patient has no further questions. She wishes to proceed.   The patient was then taken to the operating room. After satisfactory general  anesthesia had been obtained the upper chest and lower neck were prepped and draped as a sterile field. The timeout was done.  The right internal jugular vein  was entered under U/S guidance  and the guidewire threaded into the superior vena cava right atrial area under fluoroscopic guidance. An incision was then made on the anterior chest wall and a subcutaneous pocket fashioned for the port reservoir.  The port tubing was then brought through a subcutaneous tunnel from the port site to the guidewire site.  The port and catheter were attached, locked  and flushed. The catheter was measured and cut to appropriate length.The dilator and peel-away sheath were then advanced over the guidewire while monitoring this with fluoroscopy. The guidewire and dilator were removed and the tubing threaded to approximately 21 cm. The peel-away sheath was then removed. The catheter aspirated and flushed easily. Using fluoroscopy the tip was in the superior vena cava right atrial junction area. It  aspirated and flushed easily. That aspirated and flushed easily.  The reservoir was secured to the fascia with 1 sutures of 2-0 Prolene. A final check with fluoroscopy was done to make sure we had no kinks and good positioning of the tip of the catheter. Everything appeared to be okay. The catheter was aspirated, flushed with dilute heparin and then concentrated aqueous heparin.  The incision was then closed with interrupted 3-0 Vicryl, and 4-0 Monocryl subcuticular with Dermabond on the skin.  There were no operative complications. Estimated blood loss was minimal. All counts were correct. The patient tolerated the procedure well.  Turner Daniels, MD, FACS

## 2022-07-05 NOTE — Interval H&P Note (Signed)
History and Physical Interval Note:  07/05/2022 3:02 PM  Diamond Marshall  has presented today for surgery, with the diagnosis of [place].  The various methods of treatment have been discussed with the patient and family. After consideration of risks, benefits and other options for treatment, the patient has consented to  Procedure(s): INSERTION PORT-A-CATH (N/A) as a surgical intervention.  The patient's history has been reviewed, patient examined, no change in status, stable for surgery.  I have reviewed the patient's chart and labs.  Questions were answered to the patient's satisfaction.     [place]

## 2022-07-05 NOTE — Anesthesia Procedure Notes (Signed)
Procedure Name: LMA Insertion Date/Time: 07/05/2022 3:53 PM  Performed by: Mosetta Pigeon, CRNAPre-anesthesia Checklist: Patient identified, Emergency Drugs available, Suction available and Patient being monitored Patient Re-evaluated:Patient Re-evaluated prior to induction Oxygen Delivery Method: Circle System Utilized Preoxygenation: Pre-oxygenation with 100% oxygen Induction Type: IV induction Ventilation: Mask ventilation without difficulty LMA: LMA inserted LMA Size: 4.0 Number of attempts: 1 Placement Confirmation: positive ETCO2 and breath sounds checked- equal and bilateral Tube secured with: Tape Dental Injury: Teeth and Oropharynx as per pre-operative assessment

## 2022-07-05 NOTE — Telephone Encounter (Signed)
Called pt's husband to make him aware of below situation. Explained what prior authorization is, as he thought this means insurance does not want to pay for tx. Message sent to scheduler to accommodate r/s for 2/19 to make sure all appts are r/s appropriately.

## 2022-07-05 NOTE — Progress Notes (Signed)
Wasted 0.5 mg dilaudid in steri-cycle with 2nd RN Vivia [place].

## 2022-07-05 NOTE — Anesthesia Postprocedure Evaluation (Signed)
Anesthesia Post Note  Patient: Diamond Marshall  Procedure(s) Performed: INSERTION PORT-A-CATH (Right: Chest)     Patient location during evaluation: PACU Anesthesia Type: General Level of consciousness: awake Pain management: pain level controlled Vital Signs Assessment: post-procedure vital signs reviewed and stable Respiratory status: spontaneous breathing, nonlabored ventilation and respiratory function stable Cardiovascular status: blood pressure returned to baseline and stable Postop Assessment: no apparent nausea or vomiting Anesthetic complications: no   No notable events documented.  Last Vitals:  Vitals:   07/05/22 1715 07/05/22 1730  BP: 135/83 132/67  Pulse: 79 78  Resp: 20 12  Temp:    SpO2: 91% 97%    Last Pain:  Vitals:   07/05/22 1730  TempSrc:   PainSc: 4                  [name]

## 2022-07-05 NOTE — H&P (Signed)
History of Present Illness: Diamond Marshall is a 52 y.o. female who is seen today as an office consultation for evaluation of Breast Cancer  52 year old female with a 44-monthhistory of left breast lump as well as lump at the base of the left neck. This was first noted by the patient in November. She underwent workup which showed mammogram as well as ultrasound. She was noted to have multiple left axillary lymph nodes are enlarged and a 3.8 cm mass left breast overlapping sites. 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 She also had a large lymph node at the base of her neck. Difficult to discern if this was a true supraclavicular node or cervical node. Denies any joint pain, bone pain, or fatigue.  Review of Systems: A complete review of systems was obtained from the patient. I have reviewed this information and discussed as appropriate with the patient. See HPI as well for other ROS.    Medical History: Past Medical History: Diagnosis Date Anxiety History of cancer Hyperlipidemia  Patient Active Problem List Diagnosis Hyperlipidemia Anxiety and depression Essential hypertension Malignant neoplasm of upper-inner quadrant of left breast in female, estrogen receptor positive  Past Surgical History: Procedure Laterality Date CESAREAN SECTION N/A 1999   Allergies Allergen Reactions Phentermine Other (See Comments) Suicidal ideation  Became suicidal on the medication Sulfa (Sulfonamide Antibiotics) Itching Sulfamethoxazole-Trimethoprim Rash Topiramate Other (See Comments) Suicidal ideation  dizziness  Current Outpatient Medications on File Prior to Visit Medication Sig Dispense Refill diclofenac (VOLTAREN) 75 MG EC tablet Take 75 mg by mouth 2 (two) times daily as needed escitalopram oxalate (LEXAPRO) 20 MG tablet Take 20 mg by mouth once daily levothyroxine (SYNTHROID) 50 MCG tablet Take 50 mcg by mouth  once daily lisinopriL (ZESTRIL) 10 MG tablet Take 10 mg by mouth once daily TRULICITY 1.5 m99991111mL subcutaneous pen injector Inject 1.5 mg subcutaneously once a week cyanocobalamin (VITAMIN B12) 1,000 mcg/mL injection Inject 1 mL into the muscle as directed Every 15 days drospirenone-ethinyl estradioL (YASMIN) 3-0.03 mg tablet Take by mouth pantoprazole (PROTONIX) 40 MG DR tablet Take 80 mg by mouth once daily  No current facility-administered medications on file prior to visit.  Family History Problem Relation Age of Onset Anxiety Mother Obesity Mother Depression Mother Thyroid disease Mother Diabetes Mother Hyperlipidemia (Elevated cholesterol) Mother High blood pressure (Hypertension) Mother Stroke Father   Social History  Tobacco Use Smoking Status Never Smokeless Tobacco Never   Social History  Socioeconomic History Marital status: Married Tobacco Use Smoking status: Never Smokeless tobacco: Never  Objective: There were no vitals filed for this visit. There is no height or weight on file to calculate BMI.  Physical Exam HENT: Head: Normocephalic. Cardiovascular: Rate and Rhythm: Normal rate. Pulmonary: Effort: Pulmonary effort is normal. Breath sounds: No stridor. Chest: Breasts: Right: Normal.  Comments: Post bx changes left Musculoskeletal: General: Normal range of motion. Lymphadenopathy: Cervical: Cervical adenopathy present. Upper Body: Right upper body: No supraclavicular or axillary adenopathy. Left upper body: Supraclavicular adenopathy and axillary adenopathy present. Skin: General: Skin is warm. Neurological: General: No focal deficit present. Mental Status: She is alert.    Labs, Imaging and Diagnostic Testing:  Diagnosis 1. Breast, left, needle core biopsy, 9:00, 7 cmfn, ribbon clip INVASIVE DUCTAL CARCINOMA, SEE NOTE TUBULE FORMATION: SCORE 3 NUCLEAR PLEOMORPHISM: SCORE 3 MITOTIC COUNT: SCORE 2 TOTAL SCORE: 8 OVERALL  GRADE: 3 LYMPHOVASCULAR INVASION: PRESENT CANCER LENGTH: 1.2 CM CALCIFICATIONS: NOT IDENTIFIED  SEE NOTE 2. Lymph node, needle/core biopsy, left axilla, hydromark clip LYMPH NODE WITH METASTATIC CARCINOMA (1/1) Diagnosis Note 1. Dr. Alric Seton reviewed the case and concurs with the interpretation. A breast prognostic profile (ER, PR, Ki-67 and HER2) is pending and will be reported in an addendum. [place] was notified on 06/12/2022. Claudette Laws MD Pathologist, Electronic Signature (Case signed 06/12/2022) Specimen Gross and Clinical Information Specimen Comment 1. CIT: < 1 min, TIF: 10:30 am, ribbon clip, mass 2. CIT: < 1 min, TIF: 10:45 am, hydromark clip Specimen(s) Obtained: 1. Breast, left, needle core biopsy, 9:00, 7 cmfn, ribbon clip 2. Lymph node, needle/core biopsy, left axilla, hydromark clip 1 of 3 FINAL for Lipsett, Rebecka P [id number]) Specimen Clinical Information 1. Highly suspicious mass expect invasive CA 2. Abnormal lymph nodes with cortical thickening concerning for metastasis Gross 1. Received in formalin labeled with the patient's name Diamond Marshall) and "LtBr 9 o'clk 7 cmfn" are four pieces of yellow-tan fibrofatty tissue ranging from 1.0 x 0.2 x 0.2 cm to 1.7 x 0.2 x 0.2 cm, submitted in toto in a single cassette. TIF 10:30, CIT <1 minute (ribbon clip). (LEF, 06/09/2022) 2. Received in formalin labeled with the patient's name Diamond Marshall) and "Lt axilla (node)" are four pieces of yellow-tan fibrofatty tissue ranging from 1.5 x 0.2 x 0.2 cm to 1.8 x 0.2 x 0.2 cm, submitted in toto in a single cassette. TIF 10:45, CIT <1 minute (hydromark clip). (LEF, 06/09/2022) Stain(s) used in Diagnosis: The following stain(s) were used in diagnosing the case: ER-ACIS, PR-ACIS, Her2 by IHC, KI-67-ACIS. The control(s) stained appropriately. ADDITIONAL INFORMATION: 1. Breast, left, needle core biopsy, 9:00, 7 cmfn, ribbon clip PROGNOSTIC  INDICATORS Results: IMMUNOHISTOCHEMICAL AND MORPHOMETRIC ANALYSIS PERFORMED MANUALLY The tumor cells are POSITIVE for Her2 (3+). Estrogen Receptor: 65%, POSITIVE, WEAK-STRONG STAINING INTENSITY Progesterone Receptor: 15%, POSITIVE, WEAK-MODERATE STAINING INTENSITY Proliferation Marker Ki67: 50% REFERENCE RANGE ESTROGEN RECEPTOR NEGATIVE 0% POSITIVE =>1% REFERENCE RANGE PROGESTERONE RECEPTOR NEGATIVE 0% POSITIVE =>1% All controls stained appropriately. Tobin Chad MD Pathologist, Electronic Signature ( Signed 06/13/2022)  CLINICAL DATA: 52 year old female with a palpable area of concern in the left breast and left axilla. The patient states there is associated tenderness and skin erythema.  EXAM: DIGITAL DIAGNOSTIC BILATERAL MAMMOGRAM WITH TOMOSYNTHESIS; ULTRASOUND LEFT BREAST LIMITED  TECHNIQUE: Bilateral digital diagnostic mammography and breast tomosynthesis was performed.; Targeted ultrasound examination of the left breast was performed.  COMPARISON: Previous exam(s).  ACR Breast Density Category b: There are scattered areas of fibroglandular density.  FINDINGS: There is an irregular spiculated mass with associated surrounding nodularity all together measuring up to 5.8 cm. There is mild associated tethering of the skin of the inner left breast where the palpable marker is placed. There are partially visualized abnormal lymph nodes in the left axilla.  Physical examination at site of palpable concern in the inner left breast reveals mild erythema within an associated firm focal area of thickening.  Targeted ultrasound of the inner left breast was performed. There is an irregular hypoechoic mass in the left breast at 9 o'clock 7 cm from nipple measuring 3.1 x 1.5 x 1.7 cm. This corresponds well with the mass seen in the inner left breast at mammography. This mass was difficult to visualized sonographically due to deep location and patient body habitus, with  the mammographic measurement likely more accurate. At least 2 lymph nodes with abnormal cortical thickening are identified in the left axilla.  In addition, the patient points to the left lateral  neck/supraclavicular region stating she has an additional palpable mass in this location. Brief sonographic evaluation of the supraclavicular left neck reveals a probable lymph node measuring 1.9 x 1.2 x 1.4 cm.  IMPRESSION: 1. Suspicious spiculated palpable mass in the left breast with associated surrounding nodularity at the 9 o'clock position. This measures up to 3.1 cm sonographically, however due to deep location and patient body habitus the mammographic measurement of 5.8 cm is felt to be more accurate.  2. At least 2 abnormal lymph nodes in the left axilla.  3. Possible abnormal palpable left supraclavicular lymph node.  RECOMMENDATION: 1. Recommend ultrasound-guided core biopsy of the mass in the left breast at the 9 o'clock position.  2. Recommend ultrasound-guided core biopsy of 1 of the abnormal lymph nodes in the left axilla.  2. Recommend further imaging (possible dedicated neck ultrasound versus neck CT) to evaluate the possible lymph node in the left supraclavicular region.  I have discussed the findings and recommendations with the patient. If applicable, a reminder letter will be sent to the patient regarding the next appointment.  BI-RADS CATEGORY 5: Highly suggestive of malignancy.  Electronically Signed: By: Everlean Alstrom M.D. On: 06/06/2022 15:30  Assessment and Plan:  Diagnoses and all orders for this visit:  Malignant neoplasm of upper-inner quadrant of left breast in female, estrogen receptor positive   Plan Port-A-Cath for chemotherapy.  Hopefully downsizing disease for breast conservation surgery. She will require targeted lymph node biopsy at that point in time.  PET scan ordered to exclude systemic disease  Risk of port reviewed. Risk of  bleeding, infection, DVT, cardiovascular event, exacerbation of underlying medical problems, injury to mediastinal structures, discussed. Also discussed hemothorax, pneumothorax, and the need for other treatments and/or procedures.   Kennieth Francois, MD

## 2022-07-05 NOTE — Telephone Encounter (Signed)
-----   Message from Lynnae Sandhoff, LPN sent at 4/74/2595  2:38 PM EST ----- Regarding: RE: PA Pending Angel,  I called the pt to let her & her husband know what is going on with insurance. Could you please see if we can get her tx r/s for Monday 2/19? We will need to r/s each addt'l tx as well.   Thanks, LB ----- Message ----- From: Benay Pike, MD Sent: 07/05/2022   2:11 PM EST To: Chales Salmon; Gardiner Coins; # Subject: RE: PA Pending                                 Mickel Baas,  Can we take care of this and let pt know ----- Message ----- From: Gaspar Bidding Sent: 07/05/2022   2:01 PM EST To: Quay Burow; Gaspar Bidding; Gardiner Coins; # Subject: RE: PA Pending                                 I would say yes. I just got off the phone with the nurse reviewer, she advised case is being reviewed by MD and unable to give a determination date.   Caryl Ada   ----- Message ----- From: Benay Pike, MD Sent: 07/05/2022   1:46 PM EST To: Chales Salmon; Gardiner Coins; # Subject: RE: PA Pending                                 Do you suggest we cancel now? ----- Message ----- From: Gaspar Bidding Sent: 07/05/2022   1:37 PM EST To: Quay Burow; Gaspar Bidding; Gardiner Coins; # Subject: PA Pending                                     Hi,Dr. Chryl Heck,  PA pending, ordered 2/12 for 1st time treatment.  PA submitted on 2/13, pending MD review. If possible we will need to cancel tomorrow's appt to allow PA process. The PA team will keep you posted.  Best, Hillard Danker

## 2022-07-05 NOTE — Progress Notes (Signed)
Notified Dr.Miller patient was unable to give me urine sample for pregnancy test and Patient's last menstrual period was one year ago and she does not believe she could be pregnant. Dr.Miller states okay not to get sample for test if patient does not feel she could be pregnant.

## 2022-07-05 NOTE — Discharge Instructions (Signed)
PORT-A-CATH: POST OP INSTRUCTIONS  Always review your discharge instruction sheet given to you by the facility where your surgery was performed.   A prescription for pain medication may be given to you upon discharge. Take your pain medication as prescribed, if needed. If narcotic pain medicine is not needed, then you make take acetaminophen (Tylenol) or ibuprofen (Advil) as needed.  Take your usually prescribed medications unless otherwise directed. If you need a refill on your pain medication, please contact our office. All narcotic pain medicine now requires a paper prescription.  Phoned in and fax refills are no longer allowed by law.  Prescriptions will not be filled after 5 pm or on weekends.  You should follow a light diet for the remainder of the day after your procedure. Most patients will experience some mild swelling and/or bruising in the area of the incision. It may take several days to resolve. It is common to experience some constipation if taking pain medication after surgery. Increasing fluid intake and taking a stool softener (such as Colace) will usually help or prevent this problem from occurring. A mild laxative (Milk of Magnesia or Miralax) should be taken according to package directions if there are no bowel movements after 48 hours.  Unless discharge instructions indicate otherwise, you may remove your bandages 48 hours after surgery, and you may shower at that time. You may have steri-strips (small white skin tapes) in place directly over the incision.  These strips should be left on the skin for 7-10 days.  If your surgeon used Dermabond (skin glue) on the incision, you may shower in 24 hours.  The glue will flake off over the next 2-3 weeks.  If your port is left accessed at the end of surgery (needle left in port), the dressing cannot get wet and should only by changed by a healthcare professional. When the port is no longer accessed (when the needle has been removed), follow  step 7.   ACTIVITIES:  Limit activity involving your arms for the next 72 hours. Do no strenuous exercise or activity for 1 week. You may drive when you are no longer taking prescription pain medication, you can comfortably wear a seatbelt, and you can maneuver your car. 10.You may need to see your doctor in the office for a follow-up appointment.  Please       check with your doctor.  11.When you receive a new Port-a-Cath, you will get a product guide and        ID card.  Please keep them in case you need them.  WHEN TO CALL YOUR DOCTOR ([phone]): Fever over 101.0 Chills Continued bleeding from incision Increased redness and tenderness at the site Shortness of breath, difficulty breathing   The clinic staff is available to answer your questions during regular business hours. Please don't hesitate to call and ask to speak to one of the nurses or medical assistants for clinical concerns. If you have a medical emergency, go to the nearest emergency room or call 911.  A surgeon from [facility] is always on call at the hospital.     For further information, please visit [email or web address]      

## 2022-07-06 ENCOUNTER — Encounter (HOSPITAL_COMMUNITY): Payer: Self-pay | Admitting: Surgery

## 2022-07-06 ENCOUNTER — Inpatient Hospital Stay: Payer: Commercial Managed Care - PPO

## 2022-07-06 ENCOUNTER — Telehealth: Payer: Self-pay | Admitting: Hematology and Oncology

## 2022-07-06 NOTE — Telephone Encounter (Signed)
Patient aware of appointment change

## 2022-07-07 ENCOUNTER — Other Ambulatory Visit: Payer: Self-pay

## 2022-07-07 ENCOUNTER — Inpatient Hospital Stay: Payer: Commercial Managed Care - PPO

## 2022-07-07 DIAGNOSIS — Z95828 Presence of other vascular implants and grafts: Secondary | ICD-10-CM | POA: Insufficient documentation

## 2022-07-07 DIAGNOSIS — Z170 Estrogen receptor positive status [ER+]: Secondary | ICD-10-CM

## 2022-07-07 DIAGNOSIS — Z5112 Encounter for antineoplastic immunotherapy: Secondary | ICD-10-CM | POA: Diagnosis not present

## 2022-07-07 LAB — CMP (CANCER CENTER ONLY)
ALT: 14 U/L (ref 0–44)
AST: 20 U/L (ref 15–41)
Albumin: 3.8 g/dL (ref 3.5–5.0)
Alkaline Phosphatase: 84 U/L (ref 38–126)
Anion gap: 6 (ref 5–15)
BUN: 14 mg/dL (ref 6–20)
CO2: 31 mmol/L (ref 22–32)
Calcium: 9 mg/dL (ref 8.9–10.3)
Chloride: 105 mmol/L (ref 98–111)
Creatinine: 0.79 mg/dL (ref 0.44–1.00)
GFR, Estimated: >60 mL/min (ref >60)
Glucose, Bld: 137 mg/dL — ABNORMAL HIGH (ref 70–99)
Potassium: 3.5 mmol/L (ref 3.5–5.1)
Sodium: 142 mmol/L (ref 135–145)
Total Bilirubin: 0.3 mg/dL (ref 0.3–1.2)
Total Protein: 6.9 g/dL (ref 6.5–8.1)

## 2022-07-07 LAB — CBC WITH DIFFERENTIAL (CANCER CENTER ONLY)
Abs Immature Granulocytes: 0.03 10*3/uL (ref 0.00–0.07)
Basophils Absolute: 0.1 10*3/uL (ref 0.0–0.1)
Basophils Relative: 1 %
Eosinophils Absolute: 0.1 10*3/uL (ref 0.0–0.5)
Eosinophils Relative: 1 %
HCT: 36.8 % (ref 36.0–46.0)
Hemoglobin: 11.6 g/dL — ABNORMAL LOW (ref 12.0–15.0)
Immature Granulocytes: 0 %
Lymphocytes Relative: 31 %
Lymphs Abs: 3.2 10*3/uL (ref 0.7–4.0)
MCH: 24.8 pg — ABNORMAL LOW (ref 26.0–34.0)
MCHC: 31.5 g/dL (ref 30.0–36.0)
MCV: 78.6 fL — ABNORMAL LOW (ref 80.0–100.0)
Monocytes Absolute: 0.6 10*3/uL (ref 0.1–1.0)
Monocytes Relative: 6 %
Neutro Abs: 6.5 10*3/uL (ref 1.7–7.7)
Neutrophils Relative %: 61 %
Platelet Count: 362 10*3/uL (ref 150–400)
RBC: 4.68 MIL/uL (ref 3.87–5.11)
RDW: 16.3 % — ABNORMAL HIGH (ref 11.5–15.5)
WBC Count: 10.5 10*3/uL (ref 4.0–10.5)
nRBC: 0 % (ref 0.0–0.2)

## 2022-07-07 MED ORDER — HEPARIN SOD (PORK) LOCK FLUSH 100 UNIT/ML IV SOLN
500.0000 [IU] | Freq: Once | INTRAVENOUS | Status: AC
  Administered 2022-07-07: 500 [IU]

## 2022-07-07 MED ORDER — SODIUM CHLORIDE 0.9% FLUSH
10.0000 mL | Freq: Once | INTRAVENOUS | Status: AC
  Administered 2022-07-07: 10 mL

## 2022-07-08 ENCOUNTER — Inpatient Hospital Stay: Payer: Commercial Managed Care - PPO

## 2022-07-10 ENCOUNTER — Other Ambulatory Visit: Payer: Self-pay | Admitting: Hematology and Oncology

## 2022-07-10 ENCOUNTER — Encounter: Payer: Self-pay | Admitting: Adult Health

## 2022-07-10 ENCOUNTER — Ambulatory Visit: Payer: Commercial Managed Care - PPO

## 2022-07-10 MED FILL — Dexamethasone Sodium Phosphate Inj 100 MG/10ML: INTRAMUSCULAR | Qty: 1 | Status: AC

## 2022-07-10 NOTE — Progress Notes (Signed)
Treatment plan dates adjusted  Diamond Marshall

## 2022-07-11 ENCOUNTER — Inpatient Hospital Stay: Payer: Commercial Managed Care - PPO

## 2022-07-11 ENCOUNTER — Other Ambulatory Visit: Payer: Self-pay

## 2022-07-11 ENCOUNTER — Inpatient Hospital Stay: Payer: Commercial Managed Care - PPO | Admitting: Licensed Clinical Social Worker

## 2022-07-11 VITALS — BP 152/99 | HR 85 | Temp 98.1°F | Resp 17 | Wt 326.5 lb

## 2022-07-11 DIAGNOSIS — C50212 Malignant neoplasm of upper-inner quadrant of left female breast: Secondary | ICD-10-CM

## 2022-07-11 DIAGNOSIS — Z5112 Encounter for antineoplastic immunotherapy: Secondary | ICD-10-CM | POA: Diagnosis not present

## 2022-07-11 MED ORDER — SODIUM CHLORIDE 0.9 % IV SOLN: Freq: Once | INTRAVENOUS | Status: AC

## 2022-07-11 MED ORDER — TRASTUZUMAB-ANNS CHEMO 150 MG IV SOLR
8.0000 mg/kg | Freq: Once | INTRAVENOUS | Status: AC
  Administered 2022-07-11: 1260 mg via INTRAVENOUS
  Filled 2022-07-11: qty 60

## 2022-07-11 MED ORDER — SODIUM CHLORIDE 0.9 % IV SOLN
75.0000 mg/m2 | Freq: Once | INTRAVENOUS | Status: AC
  Administered 2022-07-11: 194 mg via INTRAVENOUS
  Filled 2022-07-11: qty 19.4

## 2022-07-11 MED ORDER — SODIUM CHLORIDE 0.9 % IV SOLN
840.0000 mg | Freq: Once | INTRAVENOUS | Status: AC
  Administered 2022-07-11: 840 mg via INTRAVENOUS
  Filled 2022-07-11: qty 28

## 2022-07-11 MED ORDER — SODIUM CHLORIDE 0.9 % IV SOLN
10.0000 mg | Freq: Once | INTRAVENOUS | Status: AC
  Administered 2022-07-11: 10 mg via INTRAVENOUS
  Filled 2022-07-11: qty 10

## 2022-07-11 MED ORDER — DIPHENHYDRAMINE HCL 25 MG PO CAPS
50.0000 mg | ORAL_CAPSULE | Freq: Once | ORAL | Status: AC
  Administered 2022-07-11: 50 mg via ORAL
  Filled 2022-07-11: qty 2

## 2022-07-11 MED ORDER — ACETAMINOPHEN 325 MG PO TABS
650.0000 mg | ORAL_TABLET | Freq: Once | ORAL | Status: AC
  Administered 2022-07-11: 650 mg via ORAL
  Filled 2022-07-11: qty 2

## 2022-07-11 NOTE — Patient Instructions (Signed)
[facility]  Discharge Instructions: Thank you for choosing [facility] to provide your oncology and hematology care.   If you have a lab appointment with the [facility], please go directly to the [facility] and check in at the registration area.   Wear comfortable clothing and clothing appropriate for easy access to any Portacath or PICC line.   We strive to give you quality time with your provider. You may need to reschedule your appointment if you arrive late (15 or more minutes).  Arriving late affects you and other patients whose appointments are after yours.  Also, if you miss three or more appointments without notifying the office, you may be dismissed from the clinic at the provider's discretion.      For prescription refill requests, have your pharmacy contact our office and allow 72 hours for refills to be completed.    Today you received the following chemotherapy and/or immunotherapy agents: Kanjinti, Perjeta, Taxol      To help prevent nausea and vomiting after your treatment, we encourage you to take your nausea medication as directed.  BELOW ARE SYMPTOMS THAT SHOULD BE REPORTED IMMEDIATELY: *FEVER GREATER THAN 100.4 F (38 C) OR HIGHER *CHILLS OR SWEATING *NAUSEA AND VOMITING THAT IS NOT CONTROLLED WITH YOUR NAUSEA MEDICATION *UNUSUAL SHORTNESS OF BREATH *UNUSUAL BRUISING OR BLEEDING *URINARY PROBLEMS (pain or burning when urinating, or frequent urination) *BOWEL PROBLEMS (unusual diarrhea, constipation, pain near the anus) TENDERNESS IN MOUTH AND THROAT WITH OR WITHOUT PRESENCE OF ULCERS (sore throat, sores in mouth, or a toothache) UNUSUAL RASH, SWELLING OR PAIN  UNUSUAL VAGINAL DISCHARGE OR ITCHING   Items with * indicate a potential emergency and should be followed up as soon as possible or go to the Emergency Department if any problems should occur.  Please show the CHEMOTHERAPY ALERT CARD or IMMUNOTHERAPY  ALERT CARD at check-in to the Emergency Department and triage nurse.  Should you have questions after your visit or need to cancel or reschedule your appointment, please contact [facility]  Dept: [phone]  and follow the prompts.  Office hours are 8:00 a.m. to 4:30 p.m. Monday - Friday. Please note that voicemails left after 4:00 p.m. may not be returned until the following business day.  We are closed weekends and major holidays. You have access to a nurse at all times for urgent questions. Please call the main number to the clinic Dept: [phone] and follow the prompts.   For any non-urgent questions, you may also contact your provider using MyChart. We now offer e-Visits for anyone 55 and older to request care online for non-urgent symptoms. For details visit [email or web address].   Also download the MyChart app! Go to the app store, search "MyChart", open the app, select [facility], and log in with your MyChart username and password.  Pertuzumab Injection What is this medication? PERTUZUMAB (per TOOZ ue mab) treats breast cancer. It works by blocking a protein that causes cancer cells to grow and multiply. This helps to slow or stop the spread of cancer cells. It is a monoclonal antibody. This medicine may be used for other purposes; ask your health care provider or pharmacist if you have questions. COMMON BRAND NAME(S): PERJETA What should I tell my care team before I take this medication? They need to know if you have any of these conditions: Heart failure An unusual or allergic reaction to pertuzumab, other medications, foods, dyes, or  preservatives Pregnant or trying to get pregnant Breast-feeding How should I use this medication? This medication is injected into a vein. It is given by your care team in a hospital or clinic setting. Talk to your care team about the use of this medication in children. Special care may be  needed. Overdosage: If you think you have taken too much of this medicine contact a poison control center or emergency room at once. NOTE: This medicine is only for you. Do not share this medicine with others. What if I miss a dose? Keep appointments for follow-up doses. It is important not to miss your dose. Call your care team if you are unable to keep an appointment. What may interact with this medication? Interactions are not expected. This list may not describe all possible interactions. Give your health care provider a list of all the medicines, herbs, non-prescription drugs, or dietary supplements you use. Also tell them if you smoke, drink alcohol, or use illegal drugs. Some items may interact with your medicine. What should I watch for while using this medication? Your condition will be monitored carefully while you are receiving this medication. This medication may make you feel generally unwell. This is not uncommon as chemotherapy can affect healthy cells as well as cancer cells. Report any side effects. Continue your course of treatment even though you feel ill unless your care team tells you to stop. Talk to your care team if you may be pregnant. Serious birth defects can occur if you take this medication during pregnancy and for 7 months after the last dose. You will need a negative pregnancy test before starting this medication. Contraception is recommended while taking this medication and for 7 months after the last dose. Your care team can help you find the option that works for you. Do not breastfeed while taking this medication and for 7 months after the last dose. What side effects may I notice from receiving this medication? Side effects that you should report to your care team as soon as possible: Allergic reactions or angioedema--skin rash, itching or hives, swelling of the face, eyes, lips, tongue, arms, or legs, trouble swallowing or breathing Heart failure--shortness of  breath, swelling of the ankles, feet, or hands, sudden weight gain, unusual weakness or fatigue Infusion reactions--chest pain, shortness of breath or trouble breathing, feeling faint or lightheaded Side effects that usually do not require medical attention (report to your care team if they continue or are bothersome): Diarrhea Dry skin Fatigue Hair loss Nausea Vomiting This list may not describe all possible side effects. Call your doctor for medical advice about side effects. You may report side effects to FDA at 1-800-FDA-1088. Where should I keep my medication? This medication is given in a hospital or clinic. It will not be stored at home. NOTE: This sheet is a summary. It may not cover all possible information. If you have questions about this medicine, talk to your doctor, pharmacist, or health care provider.  2023 Elsevier/Gold Standard (2021-09-20 00:00:00)  Trastuzumab Injection What is this medication? TRASTUZUMAB (tras TOO zoo mab) treats breast cancer and stomach cancer. It works by blocking a protein that causes cancer cells to grow and multiply. This helps to slow or stop the spread of cancer cells. This medicine may be used for other purposes; ask your health care provider or pharmacist if you have questions. COMMON BRAND NAME(S): Herceptin, Janae Bridgeman, Ontruzant, Trazimera What should I tell my care team before I take this medication? They  need to know if you have any of these conditions: Heart failure Lung disease An unusual or allergic reaction to trastuzumab, other medications, foods, dyes, or preservatives Pregnant or trying to get pregnant Breast-feeding How should I use this medication? This medication is injected into a vein. It is given by your care team in a hospital or clinic setting. Talk to your care team about the use of this medication in children. It is not approved for use in children. Overdosage: If you think you have taken too much of  this medicine contact a poison control center or emergency room at once. NOTE: This medicine is only for you. Do not share this medicine with others. What if I miss a dose? Keep appointments for follow-up doses. It is important not to miss your dose. Call your care team if you are unable to keep an appointment. What may interact with this medication? Certain types of chemotherapy, such as daunorubicin, doxorubicin, epirubicin, idarubicin This list may not describe all possible interactions. Give your health care provider a list of all the medicines, herbs, non-prescription drugs, or dietary supplements you use. Also tell them if you smoke, drink alcohol, or use illegal drugs. Some items may interact with your medicine. What should I watch for while using this medication? Your condition will be monitored carefully while you are receiving this medication. This medication may make you feel generally unwell. This is not uncommon, as chemotherapy affects healthy cells as well as cancer cells. Report any side effects. Continue your course of treatment even though you feel ill unless your care team tells you to stop. This medication may increase your risk of getting an infection. Call your care team for advice if you get a fever, chills, sore throat, or other symptoms of a cold or flu. Do not treat yourself. Try to avoid being around people who are sick. Avoid taking medications that contain aspirin, acetaminophen, ibuprofen, naproxen, or ketoprofen unless instructed by your care team. These medications can hide a fever. Talk to your care team if you may be pregnant. Serious birth defects can occur if you take this medication during pregnancy and for 7 months after the last dose. You will need a negative pregnancy test before starting this medication. Contraception is recommended while taking this medication and for 7 months after the last dose. Your care team can help you find the option that works for  you. Do not breastfeed while taking this medication and for 7 months after stopping treatment. What side effects may I notice from receiving this medication? Side effects that you should report to your care team as soon as possible: Allergic reactions or angioedema--skin rash, itching or hives, swelling of the face, eyes, lips, tongue, arms, or legs, trouble swallowing or breathing Dry cough, shortness of breath or trouble breathing Heart failure--shortness of breath, swelling of the ankles, feet, or hands, sudden weight gain, unusual weakness or fatigue Infection--fever, chills, cough, or sore throat Infusion reactions--chest pain, shortness of breath or trouble breathing, feeling faint or lightheaded Side effects that usually do not require medical attention (report to your care team if they continue or are bothersome): Diarrhea Dizziness Headache Nausea Trouble sleeping Vomiting This list may not describe all possible side effects. Call your doctor for medical advice about side effects. You may report side effects to FDA at 1-800-FDA-1088. Where should I keep my medication? This medication is given in a hospital or clinic. It will not be stored at home. NOTE: This sheet is a  summary. It may not cover all possible information. If you have questions about this medicine, talk to your doctor, pharmacist, or health care provider.  2023 Elsevier/Gold Standard (2021-09-08 00:00:00)  Paclitaxel Injection What is this medication? PACLITAXEL (PAK li TAX el) treats some types of cancer. It works by slowing down the growth of cancer cells. This medicine may be used for other purposes; ask your health care provider or pharmacist if you have questions. COMMON BRAND NAME(S): Onxol, Taxol What should I tell my care team before I take this medication? They need to know if you have any of these conditions: Heart disease Liver disease Low white blood cell levels An unusual or allergic reaction to  paclitaxel, other medications, foods, dyes, or preservatives If you or your partner are pregnant or trying to get pregnant Breast-feeding How should I use this medication? This medication is injected into a vein. It is given by your care team in a hospital or clinic setting. Talk to your care team about the use of this medication in children. While it may be given to children for selected conditions, precautions do apply. Overdosage: If you think you have taken too much of this medicine contact a poison control center or emergency room at once. NOTE: This medicine is only for you. Do not share this medicine with others. What if I miss a dose? Keep appointments for follow-up doses. It is important not to miss your dose. Call your care team if you are unable to keep an appointment. What may interact with this medication? Do not take this medication with any of the following: Live virus vaccines Other medications may affect the way this medication works. Talk with your care team about all of the medications you take. They may suggest changes to your treatment plan to lower the risk of side effects and to make sure your medications work as intended. This list may not describe all possible interactions. Give your health care provider a list of all the medicines, herbs, non-prescription drugs, or dietary supplements you use. Also tell them if you smoke, drink alcohol, or use illegal drugs. Some items may interact with your medicine. What should I watch for while using this medication? Your condition will be monitored carefully while you are receiving this medication. You may need blood work while taking this medication. This medication may make you feel generally unwell. This is not uncommon as chemotherapy can affect healthy cells as well as cancer cells. Report any side effects. Continue your course of treatment even though you feel ill unless your care team tells you to stop. This medication can cause  serious allergic reactions. To reduce the risk, your care team may give you other medications to take before receiving this one. Be sure to follow the directions from your care team. This medication may increase your risk of getting an infection. Call your care team for advice if you get a fever, chills, sore throat, or other symptoms of a cold or flu. Do not treat yourself. Try to avoid being around people who are sick. This medication may increase your risk to bruise or bleed. Call your care team if you notice any unusual bleeding. Be careful brushing or flossing your teeth or using a toothpick because you may get an infection or bleed more easily. If you have any dental work done, tell your dentist you are receiving this medication. Talk to your care team if you may be pregnant. Serious birth defects can occur if you take this  medication during pregnancy. Talk to your care team before breastfeeding. Changes to your treatment plan may be needed. What side effects may I notice from receiving this medication? Side effects that you should report to your care team as soon as possible: Allergic reactions--skin rash, itching, hives, swelling of the face, lips, tongue, or throat Heart rhythm changes--fast or irregular heartbeat, dizziness, feeling faint or lightheaded, chest pain, trouble breathing Increase in blood pressure Infection--fever, chills, cough, sore throat, wounds that don't heal, pain or trouble when passing urine, general feeling of discomfort or being unwell Low blood pressure--dizziness, feeling faint or lightheaded, blurry vision Low red blood cell level--unusual weakness or fatigue, dizziness, headache, trouble breathing Painful swelling, warmth, or redness of the skin, blisters or sores at the infusion site Pain, tingling, or numbness in the hands or feet Slow heartbeat--dizziness, feeling faint or lightheaded, confusion, trouble breathing, unusual weakness or fatigue Unusual bruising  or bleeding Side effects that usually do not require medical attention (report to your care team if they continue or are bothersome): Diarrhea Hair loss Joint pain Loss of appetite Muscle pain Nausea Vomiting This list may not describe all possible side effects. Call your doctor for medical advice about side effects. You may report side effects to FDA at 1-800-FDA-1088. Where should I keep my medication? This medication is given in a hospital or clinic. It will not be stored at home. NOTE: This sheet is a summary. It may not cover all possible information. If you have questions about this medicine, talk to your doctor, pharmacist, or health care provider.  2023 Elsevier/Gold Standard (2021-09-07 00:00:00)

## 2022-07-11 NOTE — Progress Notes (Signed)
[facility] CSW Progress Note  Holiday representative met with patient to provide support and check on coping during first infusion. Pt's husband present at side. Pt reports doing well overall. She has received immense support from family, friends, church community, and work. She is currently using her leave time and has paperwork for FMLA when needed. Work is also putting accommodations in place and allowing work from home if needed.  Pt states that they are taking things one day at a time now as she will see how she feels as she starts treatment.   No other needs at this time. CSW reminded pt of support services availability and encouraged pt to reach out as needed.    Dewitt Judice E Stephanye Finnicum, LCSW

## 2022-07-12 ENCOUNTER — Ambulatory Visit: Payer: Commercial Managed Care - PPO

## 2022-07-12 ENCOUNTER — Encounter: Payer: Self-pay | Admitting: Adult Health

## 2022-07-13 ENCOUNTER — Ambulatory Visit: Payer: Commercial Managed Care - PPO | Admitting: Adult Health

## 2022-07-13 ENCOUNTER — Telehealth: Payer: Self-pay | Admitting: Hematology and Oncology

## 2022-07-13 ENCOUNTER — Inpatient Hospital Stay: Payer: Commercial Managed Care - PPO

## 2022-07-13 ENCOUNTER — Other Ambulatory Visit: Payer: Self-pay

## 2022-07-13 ENCOUNTER — Other Ambulatory Visit: Payer: Commercial Managed Care - PPO

## 2022-07-13 VITALS — BP 157/94 | HR 101 | Temp 98.9°F | Resp 20

## 2022-07-13 DIAGNOSIS — Z170 Estrogen receptor positive status [ER+]: Secondary | ICD-10-CM

## 2022-07-13 DIAGNOSIS — Z5112 Encounter for antineoplastic immunotherapy: Secondary | ICD-10-CM | POA: Diagnosis not present

## 2022-07-13 MED ORDER — PEGFILGRASTIM-CBQV 6 MG/0.6ML ~~LOC~~ SOSY
6.0000 mg | PREFILLED_SYRINGE | Freq: Once | SUBCUTANEOUS | Status: AC
  Administered 2022-07-13: 6 mg via SUBCUTANEOUS
  Filled 2022-07-13: qty 0.6

## 2022-07-13 NOTE — Patient Instructions (Signed)

## 2022-07-13 NOTE — Telephone Encounter (Signed)
Returned patients phone call, she wanted to know why her care was being transferred to [facility], I reminded her we were honoring her request to transfer her care to be treated closer to home, she would like to keep her care here, sent a message to the team to cancel her original request and keep her care here

## 2022-07-14 ENCOUNTER — Telehealth: Payer: Self-pay | Admitting: *Deleted

## 2022-07-14 ENCOUNTER — Other Ambulatory Visit: Payer: Self-pay

## 2022-07-14 DIAGNOSIS — C50212 Malignant neoplasm of upper-inner quadrant of left female breast: Secondary | ICD-10-CM

## 2022-07-14 NOTE — Telephone Encounter (Signed)
This RN called pt per follow up of on call correspondence as well as her prior My Chart message.  Diamond Marshall states she took extra strength tylenol with no benefit- she used heating pad and walking with no benefit.  " I finally took an oxycodone that I had leftover from my surgery and that finally gave me some benefit"  This RN discussed above issue of leg pain secondary to the neulasta - note pt has multiple areas of osteoarthritis especially in her legs that may be causing increased discomfort.  Also discussed use of her OTC supplement for restless legs as well per guidelines noted.  Informed pt to keep a diary of her symptoms over the weekend to discuss at her next visit.  Pt is scheduled for follow up in this office on Monday.  This note will be forwarded to MD and NP for review of discussion- no further needs at this time.

## 2022-07-16 ENCOUNTER — Emergency Department (HOSPITAL_COMMUNITY)
Admission: EM | Admit: 2022-07-16 | Discharge: 2022-07-16 | Discharge status: Against Medical Advice | Payer: Commercial Managed Care - PPO | Source: Home / Self Care

## 2022-07-17 ENCOUNTER — Encounter: Payer: Self-pay | Admitting: Adult Health

## 2022-07-17 ENCOUNTER — Other Ambulatory Visit: Payer: Self-pay

## 2022-07-17 ENCOUNTER — Inpatient Hospital Stay (HOSPITAL_BASED_OUTPATIENT_CLINIC_OR_DEPARTMENT_OTHER): Payer: Commercial Managed Care - PPO | Admitting: Adult Health

## 2022-07-17 ENCOUNTER — Inpatient Hospital Stay: Payer: Commercial Managed Care - PPO

## 2022-07-17 VITALS — BP 92/45 | HR 116 | Temp 97.8°F | Resp 18 | Ht 63.0 in | Wt 320.5 lb

## 2022-07-17 VITALS — BP 125/76 | HR 100 | Resp 18

## 2022-07-17 DIAGNOSIS — Z5112 Encounter for antineoplastic immunotherapy: Secondary | ICD-10-CM | POA: Diagnosis not present

## 2022-07-17 DIAGNOSIS — Z95828 Presence of other vascular implants and grafts: Secondary | ICD-10-CM

## 2022-07-17 DIAGNOSIS — Z170 Estrogen receptor positive status [ER+]: Secondary | ICD-10-CM

## 2022-07-17 DIAGNOSIS — C50212 Malignant neoplasm of upper-inner quadrant of left female breast: Secondary | ICD-10-CM

## 2022-07-17 DIAGNOSIS — E860 Dehydration: Secondary | ICD-10-CM | POA: Diagnosis not present

## 2022-07-17 LAB — CBC WITH DIFFERENTIAL (CANCER CENTER ONLY)
Abs Immature Granulocytes: 0.96 10*3/uL — ABNORMAL HIGH (ref 0.00–0.07)
Basophils Absolute: 0.1 10*3/uL (ref 0.0–0.1)
Basophils Relative: 1 %
Eosinophils Absolute: 0.1 10*3/uL (ref 0.0–0.5)
Eosinophils Relative: 1 %
HCT: 40.1 % (ref 36.0–46.0)
Hemoglobin: 12.6 g/dL (ref 12.0–15.0)
Immature Granulocytes: 12 %
Lymphocytes Relative: 32 %
Lymphs Abs: 2.7 10*3/uL (ref 0.7–4.0)
MCH: 24.7 pg — ABNORMAL LOW (ref 26.0–34.0)
MCHC: 31.4 g/dL (ref 30.0–36.0)
MCV: 78.5 fL — ABNORMAL LOW (ref 80.0–100.0)
Monocytes Absolute: 1.5 10*3/uL — ABNORMAL HIGH (ref 0.1–1.0)
Monocytes Relative: 18 %
Neutro Abs: 3 10*3/uL (ref 1.7–7.7)
Neutrophils Relative %: 36 %
Platelet Count: 327 10*3/uL (ref 150–400)
RBC: 5.11 MIL/uL (ref 3.87–5.11)
RDW: 16.3 % — ABNORMAL HIGH (ref 11.5–15.5)
WBC Count: 8.2 10*3/uL (ref 4.0–10.5)
nRBC: 0.2 % (ref 0.0–0.2)

## 2022-07-17 LAB — CMP (CANCER CENTER ONLY)
ALT: 40 U/L (ref 0–44)
AST: 31 U/L (ref 15–41)
Albumin: 3.8 g/dL (ref 3.5–5.0)
Alkaline Phosphatase: 104 U/L (ref 38–126)
Anion gap: 8 (ref 5–15)
BUN: 25 mg/dL — ABNORMAL HIGH (ref 6–20)
CO2: 22 mmol/L (ref 22–32)
Calcium: 8.5 mg/dL — ABNORMAL LOW (ref 8.9–10.3)
Chloride: 107 mmol/L (ref 98–111)
Creatinine: 1.05 mg/dL — ABNORMAL HIGH (ref 0.44–1.00)
GFR, Estimated: >60 mL/min (ref >60)
Glucose, Bld: 107 mg/dL — ABNORMAL HIGH (ref 70–99)
Potassium: 4.3 mmol/L (ref 3.5–5.1)
Sodium: 137 mmol/L (ref 135–145)
Total Bilirubin: 0.4 mg/dL (ref 0.3–1.2)
Total Protein: 6.8 g/dL (ref 6.5–8.1)

## 2022-07-17 MED ORDER — SODIUM CHLORIDE 0.9% FLUSH
10.0000 mL | Freq: Once | INTRAVENOUS | Status: AC
  Administered 2022-07-17: 10 mL

## 2022-07-17 MED ORDER — SODIUM CHLORIDE 0.9 % IV SOLN: INTRAVENOUS | Status: AC

## 2022-07-17 MED ORDER — HEPARIN SOD (PORK) LOCK FLUSH 100 UNIT/ML IV SOLN
500.0000 [IU] | Freq: Once | INTRAVENOUS | Status: AC
  Administered 2022-07-17: 500 [IU]

## 2022-07-17 MED ORDER — SODIUM CHLORIDE 0.9 % IV SOLN: Freq: Once | INTRAVENOUS | Status: AC

## 2022-07-17 MED ORDER — HEPARIN SOD (PORK) LOCK FLUSH 100 UNIT/ML IV SOLN: 500.0000 [IU] | Freq: Once | INTRAVENOUS | Status: DC

## 2022-07-17 NOTE — Assessment & Plan Note (Addendum)
Diamond Marshall is a 52 year old woman with HER2 positive breast cancer here today for follow-up after receiving Taxotere Herceptin and Perjeta.  She really struggled with tolerating the chemotherapy due to diarrhea.  This is improving.  I reviewed with her how to take her Imodium and also send in cholestyramine if she needs it.  She will also receive IV fluids today since she is hypotensive and has a mild acute kidney injury from the dehydration related to the diarrhea.  I reviewed the above with Dr. Chryl Heck who wants her to be seen next week for labs and follow-up.  Dr. Chryl Heck is planning on dose reducing her Taxotere due to the diarrhea she experienced.

## 2022-07-17 NOTE — Progress Notes (Signed)
[facility] Follow up:    Diamond Fraise, MD [place] [address]   DIAGNOSIS:  Cancer Staging  Malignant neoplasm of upper-inner quadrant of left breast in female, estrogen receptor positive ([place]) Staging form: Breast, AJCC 8th Edition - Clinical stage from 06/21/2022: Stage IIIB (cT3, cN3, cM0, G3, ER+, PR+, HER2+) - Unsigned Stage prefix: Initial diagnosis Histologic grading system: 3 grade system Stage used in treatment planning: Yes National guidelines used in treatment planning: Yes Type of national guideline used in treatment planning: NCCN   SUMMARY OF ONCOLOGIC HISTORY: Oncology History  Malignant neoplasm of upper-inner quadrant of left breast in female, estrogen receptor positive ([place])  06/06/2022 Mammogram   Diagnostic mammogram given palpable mass showed suspicious spiculated left breast mass with surrounding nodularity at 9 o'clock position.  This measures up to 3.1 cm sonographically however due to deep location and patient body habitus the mammographic measurement of 5.8 cm is felt to be more accurate.  At least 2 abnormal lymph nodes in the left axilla.  Possible abnormal palpable left supraclavicular lymph node.   06/06/2022 Breast US   Breast ultrasound confirmed these findings.   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  Prognostic showed ER 65% weak to strong staining, PR 15% weak to moderate staining, Ki-67 of 50% and HER2 positive by IHC at 3+    Genetic Testing   [facility] Custom Panel+RNA was Negative. Report date is 06/28/2022.   The Custom Hereditary Cancers Panel offered by [facility] includes sequencing and/or deletion duplication testing of the following 43 genes: APC, ATM, AXIN2, BAP1, BARD1, BMPR1A, BRCA1, BRCA2, BRIP1, CDH1, CDK4, CDKN2A (p14ARF and  p16INK4a only), CHEK2, CTNNA1, EPCAM (Deletion/duplication testing only), FH, GREM1 (promoter region duplication testing only), HOXB13, KIT, MBD4, MEN1, MLH1, MSH2, MSH3, MSH6, MUTYH, NF1, NHTL1, PALB2, PDGFRA, PMS2, POLD1, POLE, PTEN, RAD51C, RAD51D, SMAD4, SMARCA4. STK11, TP53, TSC1, TSC2, and VHL.    06/29/2022 PET scan   IMPRESSION: 1. Hypermetabolic left breast mass, compatible with primary breast malignancy. 2. Hypermetabolic left cervical, axillary, subpectoral, supraclavicular and prevascular mediastinal lymph nodes, compatible with nodal metastatic disease. 3. No evidence of metastatic disease in the abdomen or pelvis. 4. Aortic Atherosclerosis (ICD10-I70.0).     Electronically Signed   By: Yetta Glassman M.D.   On: 06/29/2022 09:32   07/11/2022 -  Chemotherapy   Patient is on Treatment Plan : BREAST DOCEtaxel + Trastuzumab + Pertuzumab (THP) q21d x 8 cycles / Trastuzumab + Pertuzumab q21d x 4 cycles       CURRENT THERAPY: THP cycle 1 day 7  INTERVAL HISTORY: Diamond Marshall 52 y.o. female returns for f/u after her first cycle of chemotherapy.  She experienced persistent diarrhea that started Friday evening and continued throughout the weekend.  She began to feel very unwell over the weekend which she described as a tired and weak feeling.  She called on-call 3-4 times over the weekend with concerns of how she was feeling.  She was instructed on how to take Imodium with 2 tablets after first bowel movement and then 1 tablet with each subsequent bowel movement for max of 8 tablets in 24 hours.  She followed these instructions and still did not improve and therefore was instructed to go to the emergency room.    She went and after waiting to  be seen for 2 hours in the waiting room and she notes that there were people coughing around her and being on chemotherapy she did not feel comfortable staying in that area as she had during her chemotherapy teaching to avoid sick contacts.  She  tells me that nobody got her vital signs due to nursing assessment or even evaluated her symptoms.    Overnight she did not have any continued diarrhea.  She had 1 loose bowel movement this morning.  She notes that she has been drinking Gatorade light which has helped her not feel so poorly.  Today her blood pressure is 88/68 and she does have a slight lightheaded feeling with position changes.  Patient Active Problem List   Diagnosis Date Noted   Port-A-Cath in place 07/07/2022   Genetic testing 06/29/2022   Malignant neoplasm of upper-inner quadrant of left breast in female, estrogen receptor positive ([place]) 06/19/2022   Chronic pain of right knee 03/27/2022   Essential hypertension 03/27/2022   Positive self-administered antigen test for COVID-19 03/15/2021   Prediabetes 07/22/2019   Anxiety and depression 03/15/2016   Vitamin D deficiency 0000000   Metabolic syndrome X [id number]   Elevated random blood glucose level 08/07/2013   Hypertriglyceridemia 08/07/2013   ADD (attention deficit disorder) 07/24/2013    hyperlipidemia 10/09/2012   Morbid obesity ([place]) 10/09/2012   Hypothyroidism 10/09/2012   B12 deficiency 10/09/2012   Anemia, unspecified 02/12/2011   Dysthymic disorder 02/12/2011   Migraine headache 02/12/2011   Obsessive-compulsive disorder 02/12/2011   Tinnitus 02/12/2011    is allergic to misc. sulfonamide containing compounds, phentermine, bee venom, peanut allergen powder-dnfp, topiramate, septra [sulfamethoxazole-trimethoprim], and sulfa antibiotics.  MEDICAL HISTORY: Past Medical History:  Diagnosis Date   Abdominal hernia    Anemia    b12 deficiency   Anxiety    Arthritis    B12 deficiency    Back pain    Cancer (HCC)    breast cancer   Depression    Family history of adverse reaction to anesthesia    mother vomits after anesthesia   Gallbladder problem    GERD (gastroesophageal reflux disease)    History of hiatal hernia    "small" per patient    Hypertension    Hypothyroidism    Joint pain    Knee pain    Palpitation    "when iron is low"   Pernicious anemia    SOB (shortness of breath)    Thyroid disease    hypothyroidism   Vitamin D deficiency     SURGICAL HISTORY: Past Surgical History:  Procedure Laterality Date   BREAST BIOPSY Left 06/09/2022   [place] LT BREAST BX W LOC DEV 1ST LESION IMG BX [place] US GUIDE 06/09/2022 [facility] MAMMOGRAPHY   CESAREAN SECTION  1999   Dilatation and currettagement     for miscarriage 18 years ago   PORTACATH PLACEMENT Right 07/05/2022   Procedure: INSERTION PORT-A-CATH;  Surgeon: Erroll Luna, MD;  Location: [facility] OR;  Service: General;  Laterality: Right;    SOCIAL HISTORY: Social History   Socioeconomic History   Marital status: Married    Spouse name: Ronalee Belts   Number of children: Not on file   Years of education: Not on file   Highest education level: Not on file  Occupational History   Occupation: Adult Water engineer  Tobacco Use   Smoking status: Never   Smokeless tobacco: Never  Vaping Use   Vaping Use: Never used  Substance and Sexual Activity  Alcohol use: No   Drug use: No   Sexual activity: Yes    Birth control/protection: Pill  Other Topics Concern   Not on file  Social History Narrative   Not on file   Social Determinants of Health   Financial Resource Strain: Not on file  Food Insecurity: No Food Insecurity (06/21/2022)   Hunger Vital Sign    Worried About Running Out of Food in the Last Year: Never true    Ran Out of Food in the Last Year: Never true  Transportation Needs: No Transportation Needs (06/21/2022)   PRAPARE - Hydrologist (Medical): No    Lack of Transportation (Non-Medical): No  Physical Activity: Not on file  Stress: Not on file  Social Connections: Not on file  Intimate Partner Violence: Not on file    FAMILY HISTORY: Family History  Problem Relation Age of Onset   Hypertension Mother    Diabetes  Mother    High Cholesterol Mother    Thyroid disease Mother    Depression Mother    Anxiety disorder Mother    Obesity Mother    CVA Father        hemorrhagic   Ovarian cancer Maternal Aunt 13   Colon cancer Maternal Aunt    Breast cancer Cousin 32       maternal first cousin, reports negative genetic testing    Review of Systems  Constitutional:  Positive for fatigue. Negative for appetite change, chills, fever and unexpected weight change.  HENT:   Negative for hearing loss, lump/mass, mouth sores and trouble swallowing.   Eyes:  Negative for eye problems and icterus.  Respiratory:  Negative for chest tightness, cough and shortness of breath.   Cardiovascular:  Negative for chest pain, leg swelling and palpitations.  Gastrointestinal:  Positive for diarrhea, nausea and vomiting (X 1 nausea controlled with antinausea medications). Negative for abdominal distention, abdominal pain, blood in stool, constipation and rectal pain.  Endocrine: Negative for hot flashes.  Genitourinary:  Negative for difficulty urinating.   Musculoskeletal:  Negative for arthralgias.  Skin:  Negative for itching and rash.  Neurological:  Positive for light-headedness. Negative for dizziness, extremity weakness, headaches and numbness.  Hematological:  Negative for adenopathy. Does not bruise/bleed easily.  Psychiatric/Behavioral:  Negative for depression. The patient is not nervous/anxious.       PHYSICAL EXAMINATION  ECOG PERFORMANCE STATUS: 2 - Symptomatic, <50% confined to bed  Vitals:   07/17/22 1417  BP: (!) 92/45  Pulse: (!) 116  Resp: 18  Temp: 97.8 F (36.6 C)  SpO2: 97%    Physical Exam Constitutional:      General: She is not in acute distress.    Appearance: Normal appearance. She is ill-appearing (She appears tired and is if she has had a tough weekend.). She is not toxic-appearing.  HENT:     Head: Normocephalic and atraumatic.     Mouth/Throat:     Mouth: Mucous membranes  are moist.     Pharynx: Oropharynx is clear.  Eyes:     General: No scleral icterus. Cardiovascular:     Rate and Rhythm: Regular rhythm. Tachycardia present.     Pulses: Normal pulses.     Heart sounds: Normal heart sounds.  Pulmonary:     Effort: Pulmonary effort is normal.     Breath sounds: Normal breath sounds.  Abdominal:     General: Abdomen is flat. Bowel sounds are normal. There is no distension.  Palpations: Abdomen is soft.     Tenderness: There is no abdominal tenderness.  Musculoskeletal:        General: No swelling.     Cervical back: Neck supple.  Lymphadenopathy:     Cervical: No cervical adenopathy.  Skin:    General: Skin is warm and dry.     Findings: No rash.  Neurological:     General: No focal deficit present.     Mental Status: She is alert.  Psychiatric:        Mood and Affect: Mood normal.        Behavior: Behavior normal.     LABORATORY DATA:  CBC    Component Value Date/Time   WBC 8.2 07/17/2022 1348   WBC 19.1 (H) 10/27/2019 2208   RBC 5.11 07/17/2022 1348   HGB 12.6 07/17/2022 1348   HGB 12.0 07/21/2021 1511   HCT 40.1 07/17/2022 1348   HCT 37.6 07/21/2021 1511   PLT 327 07/17/2022 1348   PLT 359 07/21/2021 1511   MCV 78.5 (L) 07/17/2022 1348   MCV 76 (L) 07/21/2021 1511   MCH 24.7 (L) 07/17/2022 1348   MCHC 31.4 07/17/2022 1348   RDW 16.3 (H) 07/17/2022 1348   RDW 15.7 (H) 07/21/2021 1511   LYMPHSABS 2.7 07/17/2022 1348   LYMPHSABS 1.9 07/21/2021 1511   MONOABS 1.5 (H) 07/17/2022 1348   EOSABS 0.1 07/17/2022 1348   EOSABS 0.2 07/21/2021 1511   BASOSABS 0.1 07/17/2022 1348   BASOSABS 0.1 07/21/2021 1511    CMP     Component Value Date/Time   NA 137 07/17/2022 1348   NA 139 03/28/2022 0811   K 4.3 07/17/2022 1348   CL 107 07/17/2022 1348   CO2 22 07/17/2022 1348   GLUCOSE 107 (H) 07/17/2022 1348   BUN 25 (H) 07/17/2022 1348   BUN 14 03/28/2022 0811   CREATININE 1.05 (H) 07/17/2022 1348   CREATININE 0.74 10/09/2012  1537   CALCIUM 8.5 (L) 07/17/2022 1348   PROT 6.8 07/17/2022 1348   PROT 7.1 03/28/2022 0811   ALBUMIN 3.8 07/17/2022 1348   ALBUMIN 4.2 03/28/2022 0811   AST 31 07/17/2022 1348   ALT 40 07/17/2022 1348   ALKPHOS 104 07/17/2022 1348   BILITOT 0.4 07/17/2022 1348   GFRNONAA >60 07/17/2022 1348   GFRNONAA >89 10/09/2012 1537   GFRAA 96 05/18/2020 1442   GFRAA >89 10/09/2012 1537       ASSESSMENT and THERAPY PLAN:   Malignant neoplasm of upper-inner quadrant of left breast in female, estrogen receptor positive ([place]) Pheba is a 52 year old woman with HER2 positive breast cancer here today for follow-up after receiving Taxotere Herceptin and Perjeta.  She really struggled with tolerating the chemotherapy due to diarrhea.  This is improving.  I reviewed with her how to take her Imodium and also send in cholestyramine if she needs it.  She will also receive IV fluids today since she is hypotensive and has a mild acute kidney injury from the dehydration related to the diarrhea.  I reviewed the above with Dr. Chryl Heck who wants her to be seen next week for labs and follow-up.  Dr. Chryl Heck is planning on dose reducing her Taxotere due to the diarrhea she experienced.     All questions were answered. The patient knows to call the clinic with any problems, questions or concerns. We can certainly see the patient much sooner if necessary.  Total encounter time:30 minutes*in face-to-face visit time, chart review, lab review, care coordination,  order entry, and documentation of the encounter time.    Wilber Bihari, NP 07/17/22 3:09 PM Medical Oncology and Hematology [facility] [place], [place] [address] Tel. [phone]    Fax. [phone]  *Total Encounter Time as defined by the Centers for Medicare and Medicaid Services includes, in addition to the face-to-face time of a patient visit (documented in the note above) non-face-to-face time: obtaining and  reviewing outside history, ordering and reviewing medications, tests or procedures, care coordination (communications with other health care professionals or caregivers) and documentation in the medical record.

## 2022-07-18 ENCOUNTER — Encounter: Payer: Self-pay | Admitting: Family Medicine

## 2022-07-23 ENCOUNTER — Encounter: Payer: Self-pay | Admitting: Adult Health

## 2022-07-24 ENCOUNTER — Telehealth: Payer: Self-pay

## 2022-07-24 ENCOUNTER — Other Ambulatory Visit: Payer: Self-pay

## 2022-07-24 ENCOUNTER — Inpatient Hospital Stay (HOSPITAL_BASED_OUTPATIENT_CLINIC_OR_DEPARTMENT_OTHER): Payer: Commercial Managed Care - PPO | Admitting: Physician Assistant

## 2022-07-24 ENCOUNTER — Inpatient Hospital Stay: Payer: Commercial Managed Care - PPO | Attending: Hematology and Oncology

## 2022-07-24 VITALS — BP 151/88 | HR 80 | Temp 98.3°F | Resp 16 | Wt 325.5 lb

## 2022-07-24 DIAGNOSIS — R197 Diarrhea, unspecified: Secondary | ICD-10-CM

## 2022-07-24 DIAGNOSIS — Z8041 Family history of malignant neoplasm of ovary: Secondary | ICD-10-CM | POA: Insufficient documentation

## 2022-07-24 DIAGNOSIS — Z170 Estrogen receptor positive status [ER+]: Secondary | ICD-10-CM | POA: Diagnosis not present

## 2022-07-24 DIAGNOSIS — C50212 Malignant neoplasm of upper-inner quadrant of left female breast: Secondary | ICD-10-CM

## 2022-07-24 DIAGNOSIS — E039 Hypothyroidism, unspecified: Secondary | ICD-10-CM | POA: Diagnosis not present

## 2022-07-24 DIAGNOSIS — R112 Nausea with vomiting, unspecified: Secondary | ICD-10-CM | POA: Diagnosis not present

## 2022-07-24 DIAGNOSIS — Z5112 Encounter for antineoplastic immunotherapy: Secondary | ICD-10-CM | POA: Diagnosis present

## 2022-07-24 DIAGNOSIS — E860 Dehydration: Secondary | ICD-10-CM

## 2022-07-24 DIAGNOSIS — T451X5D Adverse effect of antineoplastic and immunosuppressive drugs, subsequent encounter: Secondary | ICD-10-CM | POA: Insufficient documentation

## 2022-07-24 DIAGNOSIS — Z95828 Presence of other vascular implants and grafts: Secondary | ICD-10-CM

## 2022-07-24 DIAGNOSIS — Z5189 Encounter for other specified aftercare: Secondary | ICD-10-CM | POA: Diagnosis not present

## 2022-07-24 DIAGNOSIS — Z803 Family history of malignant neoplasm of breast: Secondary | ICD-10-CM | POA: Diagnosis not present

## 2022-07-24 DIAGNOSIS — Z800 Family history of malignant neoplasm of digestive organs: Secondary | ICD-10-CM | POA: Diagnosis not present

## 2022-07-24 DIAGNOSIS — R5383 Other fatigue: Secondary | ICD-10-CM | POA: Diagnosis not present

## 2022-07-24 LAB — C DIFFICILE QUICK SCREEN W PCR REFLEX
C Diff antigen: NEGATIVE
C Diff interpretation: NOT DETECTED
C Diff toxin: NEGATIVE

## 2022-07-24 LAB — CMP (CANCER CENTER ONLY)
ALT: 34 U/L (ref 0–44)
AST: 22 U/L (ref 15–41)
Albumin: 3.6 g/dL (ref 3.5–5.0)
Alkaline Phosphatase: 118 U/L (ref 38–126)
Anion gap: 7 (ref 5–15)
BUN: 11 mg/dL (ref 6–20)
CO2: 25 mmol/L (ref 22–32)
Calcium: 8.4 mg/dL — ABNORMAL LOW (ref 8.9–10.3)
Chloride: 110 mmol/L (ref 98–111)
Creatinine: 0.87 mg/dL (ref 0.44–1.00)
GFR, Estimated: >60 mL/min (ref >60)
Glucose, Bld: 109 mg/dL — ABNORMAL HIGH (ref 70–99)
Potassium: 3.6 mmol/L (ref 3.5–5.1)
Sodium: 142 mmol/L (ref 135–145)
Total Bilirubin: 0.3 mg/dL (ref 0.3–1.2)
Total Protein: 6.2 g/dL — ABNORMAL LOW (ref 6.5–8.1)

## 2022-07-24 LAB — CBC WITH DIFFERENTIAL (CANCER CENTER ONLY)
Abs Immature Granulocytes: 0.64 10*3/uL — ABNORMAL HIGH (ref 0.00–0.07)
Basophils Absolute: 0.1 10*3/uL (ref 0.0–0.1)
Basophils Relative: 1 %
Eosinophils Absolute: 0 10*3/uL (ref 0.0–0.5)
Eosinophils Relative: 0 %
HCT: 35.2 % — ABNORMAL LOW (ref 36.0–46.0)
Hemoglobin: 11.3 g/dL — ABNORMAL LOW (ref 12.0–15.0)
Immature Granulocytes: 6 %
Lymphocytes Relative: 23 %
Lymphs Abs: 2.6 10*3/uL (ref 0.7–4.0)
MCH: 25 pg — ABNORMAL LOW (ref 26.0–34.0)
MCHC: 32.1 g/dL (ref 30.0–36.0)
MCV: 77.9 fL — ABNORMAL LOW (ref 80.0–100.0)
Monocytes Absolute: 0.4 10*3/uL (ref 0.1–1.0)
Monocytes Relative: 4 %
Neutro Abs: 7.6 10*3/uL (ref 1.7–7.7)
Neutrophils Relative %: 66 %
Platelet Count: 142 10*3/uL — ABNORMAL LOW (ref 150–400)
RBC: 4.52 MIL/uL (ref 3.87–5.11)
RDW: 16.7 % — ABNORMAL HIGH (ref 11.5–15.5)
WBC Count: 11.4 10*3/uL — ABNORMAL HIGH (ref 4.0–10.5)
nRBC: 0.2 % (ref 0.0–0.2)

## 2022-07-24 LAB — MAGNESIUM: Magnesium: 1.7 mg/dL (ref 1.7–2.4)

## 2022-07-24 MED ORDER — SODIUM CHLORIDE 0.9 % IV SOLN: Freq: Once | INTRAVENOUS | Status: DC

## 2022-07-24 MED ORDER — METHYLPREDNISOLONE 4 MG PO TBPK: ORAL_TABLET | ORAL | 0 refills | Status: DC

## 2022-07-24 MED ORDER — DIPHENOXYLATE-ATROPINE 2.5-0.025 MG PO TABS: 2.0000 | ORAL_TABLET | Freq: Four times a day (QID) | ORAL | 0 refills | Status: DC | PRN

## 2022-07-24 MED ORDER — HEPARIN SOD (PORK) LOCK FLUSH 100 UNIT/ML IV SOLN
500.0000 [IU] | Freq: Once | INTRAVENOUS | Status: AC
  Administered 2022-07-24: 500 [IU]

## 2022-07-24 MED ORDER — SODIUM CHLORIDE 0.9% FLUSH
10.0000 mL | Freq: Once | INTRAVENOUS | Status: AC
  Administered 2022-07-24: 10 mL

## 2022-07-24 MED ORDER — CHOLESTYRAMINE 4 G PO PACK: 4.0000 g | PACK | Freq: Three times a day (TID) | ORAL | 12 refills | Status: DC

## 2022-07-24 MED ORDER — SODIUM CHLORIDE 0.9 % IV SOLN: Freq: Once | INTRAVENOUS | Status: AC

## 2022-07-24 NOTE — Progress Notes (Signed)
No blood return while accessing patient's port. Saline was easily flushed, but patient endorsed a mild amount of stinging and could "not taste the saline as usual". When checking for blood return, no blood noted in the syringe but there was a translucent light yellow-brown tinge to the saline. No purulent drainage or severe pain was noted. RN attempt X2 made with same result- saline flushing easily, yet no blood return and mild amount of stinging reported from patient. Tama Headings, LPN, to assess port and noted the same issue. Sterile technique was maintained throughout the process. When asked, patient denied heavy lifting or movement which could have displaced her port. She did however endorse that her cat jumped on her chest shortly after port was placed and wonders if this could have effected anything. Both nurses also noted a mild amount of saline leaking from patient's incision site. Port was deeacessed and no further medication put in port until further evaluation. Sherol Dade, PA-C to chairside and advised consult be placed for surgery to evaluate port to ensure proper placement. PIV placed with patient's permission.   Marlowe Aschoff, RN with successful port access with verbal permission of Sherol Dade, [place]. Blood return noted and patient with no complaints. Per Abran Richard, RN, port was accessed slightly below 2 original RN attempts. No need for surgery/IR evaluation at this time.

## 2022-07-24 NOTE — Telephone Encounter (Signed)
Called pt per [employer]. She reports incontinent diarrhea, intractable maxing out on imodium daily. She reports 3 episodes of loose stools this morning. She reports her diet is not the best, as she is trying to eat what she can as she has lost appetite. She reports food such as bagels with cream cheese, eggs, bananas, chicken tenders, french fries, but reports she did have veggie soup over the weekend. She reports she has been drinking gatorade in hopes of replacing electrolytes. Pt was educated on bland foods and drinking gatorade lyte, as to avoid sugar which will make diarrhea worse. She verbalized understanding. Recommended consult with our dieticians to further assist in nutrition education and she accepted. OK to enter referral per MD.  She was offered appt with [facility] today to review labs and possibly have IVF for dehydration. She accepted and knows to come at 1115 for check in.

## 2022-07-24 NOTE — Progress Notes (Signed)
Symptom Management Consult Note [place]    Patient Care Team: Claretta Fraise, MD as PCP - General (Family Medicine) Erroll Luna, MD as Consulting Physician (General Surgery) Benay Pike, MD as Consulting Physician (Hematology and Oncology) Eppie Gibson, MD as Attending Physician (Radiation Oncology) Mauro Kaufmann, RN as Oncology Nurse Navigator Rockwell Germany, RN as Oncology Nurse Navigator    Name / MRN / DOB: Diamond Marshall  [id number]  1970/11/22   Date of visit: 07/24/2022   Chief Complaint/Reason for visit: diarrhea   Current Therapy: THP   Last treatment:  Day 1   Cycle 1 on 07/11/22   ASSESSMENT & PLAN: Patient is a 52 y.o. female  with oncologic history of malignant neoplasm of upper-inner quadrant of left breast in female, estrogen receptor positive followed by Dr. Chryl Heck.  I have viewed most recent oncology note and lab work.    #Malignant neoplasm of upper-inner quadrant of left breast in female, estrogen receptor positive  - Next appointment with oncologist is 08/02/22   #Diarrhea - Patient is nontoxic-appearing.  Afebrile and HDS.  Nontender abdominal exam.  No significant clinical signs of dehydration -Diarrhea is grade 3 based on HPI. -CBC with mild leukocytosis 11.4, stable anemia, thrombocytopenia likely treatment related. CMP without significant electrolyte derangement, no renal insufficiency.  Magnesium is also normal.  C. difficile quick screening is negative, GI pathogen collected and is in process. -Patient given 1L NS in clinic for hydration support -Engage in shared decision making with patient.  She wants to continue to try outpatient management of her diarrhea.  With her reassuring vitals and labs I feel this is appropriate.  Prescription sent to pharmacy for [place] as this could be related to immunotherapy. -Prescriptions also sent for Lomotil and Questran.  Patient knows not to take these until stool studies  have resulted to ensure everything is negative. - Discussed plan with oncologist who agrees. - Strict ED precautions discussed should symptoms worsen. Patient knows she can RTC for IVF if needed.      Heme/Onc History: Oncology History  Malignant neoplasm of upper-inner quadrant of left breast in female, estrogen receptor positive ([place])  06/06/2022 Mammogram   Diagnostic mammogram given palpable mass showed suspicious spiculated left breast mass with surrounding nodularity at 9 o'clock position.  This measures up to 3.1 cm sonographically however due to deep location and patient body habitus the mammographic measurement of 5.8 cm is felt to be more accurate.  At least 2 abnormal lymph nodes in the left axilla.  Possible abnormal palpable left supraclavicular lymph node.   06/06/2022 Breast US   Breast ultrasound confirmed these findings.   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  Prognostic showed ER 65% weak to strong staining, PR 15% weak to moderate staining, Ki-67 of 50% and HER2 positive by IHC at 3+    Genetic Testing   [facility] Custom Panel+RNA was Negative. Report date is 06/28/2022.   The Custom Hereditary Cancers Panel offered by [facility] includes sequencing and/or deletion duplication testing of the following 43 genes: APC, ATM, AXIN2, BAP1, BARD1, BMPR1A, BRCA1, BRCA2, BRIP1, CDH1, CDK4, CDKN2A (p14ARF and p16INK4a only), CHEK2, CTNNA1, EPCAM (Deletion/duplication testing only), FH, GREM1 (promoter region duplication testing only), HOXB13, KIT, MBD4, MEN1, MLH1, MSH2, MSH3, MSH6, MUTYH, NF1, NHTL1, PALB2, PDGFRA, PMS2, POLD1,  POLE, PTEN, RAD51C, RAD51D, SMAD4, SMARCA4. STK11, TP53, TSC1, TSC2, and VHL.    06/29/2022 PET scan   IMPRESSION: 1. Hypermetabolic left breast mass, compatible with primary  breast malignancy. 2. Hypermetabolic left cervical, axillary, subpectoral, supraclavicular and prevascular mediastinal lymph nodes, compatible with nodal metastatic disease. 3. No evidence of metastatic disease in the abdomen or pelvis. 4. Aortic Atherosclerosis (ICD10-I70.0).     Electronically Signed   By: Yetta Glassman M.D.   On: 06/29/2022 09:32   07/11/2022 -  Chemotherapy   Patient is on Treatment Plan : BREAST DOCEtaxel + Trastuzumab + Pertuzumab (THP) q21d x 8 cycles / Trastuzumab + Pertuzumab q21d x 4 cycles         Interval history-: Diamond Marshall is a 52 y.o. female with oncologic history as above presenting to [facility] today with chief complaint of diarrhea x 2 weeks. She presents unaccompanied to clinic.  Patient had her first chemotherapy on 07/11/2022.  2 days after treatment she states diarrhea started.  It has been progressively worsening.  Patient admits in the last 24 hours she has had approximately 15 bowel movements.  She describes stool as liquid and yellow.  She has tried taking Imodium without much improvement.  Preceding her bowel movements she does experience cramping sensation that resolves after using the bathroom.  She has had very mild nausea that is controlled with home medications.  She is staying well-hydrated, drinks 40 ounces of fluids daily including water and Gatorade.  She denies any recent travel or antibiotic use.  No one in the home has similar symptoms.  She has not had any fever.  She did report an episode of stool incontinence yesterday.  As soon as she has a sensation she needs to go the bathroom it is difficult for her to make it there in time.  She denies any urinary symptoms.        ROS  All other systems are reviewed and are negative for acute change except as noted in the HPI.    Allergies  Allergen Reactions   Misc. Sulfonamide Containing Compounds Rash and Itching   Phentermine     Became suicidal on the medication   Bee Venom  Swelling   Peanut Allergen Powder-Dnfp Other (See Comments)    Migraine   Topiramate Other (See Comments)    dizziness   Septra [Sulfamethoxazole-Trimethoprim] Rash   Sulfa Antibiotics Rash     Past Medical History:  Diagnosis Date   Abdominal hernia    Anemia    b12 deficiency   Anxiety    Arthritis    B12 deficiency    Back pain    Cancer (HCC)    breast cancer   Depression    Family history of adverse reaction to anesthesia    mother vomits after anesthesia   Gallbladder problem    GERD (gastroesophageal reflux disease)    History of hiatal hernia    "small" per patient   Hypertension    Hypothyroidism    Joint pain    Knee pain    Palpitation    "when iron is low"   Pernicious anemia    SOB (shortness of breath)    Thyroid disease    hypothyroidism   Vitamin D deficiency      Past Surgical History:  Procedure Laterality Date   BREAST BIOPSY Left 06/09/2022   [place] LT BREAST BX W LOC DEV 1ST LESION IMG BX SPEC US GUIDE 06/09/2022 [facility]   CESAREAN SECTION  1999   Dilatation and currettagement     for miscarriage 18 years ago   PORTACATH PLACEMENT Right 07/05/2022   Procedure: INSERTION PORT-A-CATH;  Surgeon: Erroll Luna, MD;  Location: [place];  Service: General;  Laterality: Right;    Social History   Socioeconomic History   Marital status: Married    Spouse name: Ronalee Belts   Number of children: Not on file   Years of education: Not on file   Highest education level: Not on file  Occupational History   Occupation: Adult Water engineer  Tobacco Use   Smoking status: Never   Smokeless tobacco: Never  Vaping Use   Vaping Use: Never used  Substance and Sexual Activity   Alcohol use: No   Drug use: No   Sexual activity: Yes    Birth control/protection: Pill  Other Topics Concern   Not on file  Social History Narrative   Not on file   Social Determinants of Health   Financial Resource Strain: Not on file  Food Insecurity: No Food  Insecurity (06/21/2022)   Hunger Vital Sign    Worried About Running Out of Food in the Last Year: Never true    Ran Out of Food in the Last Year: Never true  Transportation Needs: No Transportation Needs (06/21/2022)   PRAPARE - Hydrologist (Medical): No    Lack of Transportation (Non-Medical): No  Physical Activity: Not on file  Stress: Not on file  Social Connections: Not on file  Intimate Partner Violence: Not on file    Family History  Problem Relation Age of Onset   Hypertension Mother    Diabetes Mother    High Cholesterol Mother    Thyroid disease Mother    Depression Mother    Anxiety disorder Mother    Obesity Mother    CVA Father        hemorrhagic   Ovarian cancer Maternal Aunt 75   Colon cancer Maternal Aunt    Breast cancer Cousin 68       maternal first cousin, reports negative genetic testing     Current Outpatient Medications:    cholestyramine (QUESTRAN) 4 g packet, Take 1 packet (4 g total) by mouth 3 (three) times daily with meals., Disp: 60 each, Rfl: 12   diphenoxylate-atropine (LOMOTIL) 2.5-0.025 MG tablet, Take 2 tablets by mouth every 6 (six) hours as needed for diarrhea or loose stools., Disp: 30 tablet, Rfl: 0   methylPREDNISolone (MEDROL DOSEPAK) 4 MG TBPK tablet, Take 6 pills by mouth day 1, 5 on day 2, 4 on day 3, 3 on day 4, 2 on day 5, 1 on day 6, Disp: 21 tablet, Rfl: 0   cyanocobalamin (,VITAMIN B-12,) 1000 MCG/ML injection, INJECT 1 ML INTRAMUSCULARLY EVERY 15 DAYS, Disp: 6 mL, Rfl: 1   dexamethasone (DECADRON) 4 MG tablet, Take 2 tabs by mouth 2 times daily starting day before chemo. Then take 2 tabs daily for 2 days starting day after chemo. Take with food. (Patient not taking: Reported on 07/04/2022), Disp: 30 tablet, Rfl: 1   diclofenac (VOLTAREN) 75 MG EC tablet, TAKE ONE TABLET TWICE DAILY AS NEEDED. needs appointment FOR refills (Patient not taking: Reported on 07/04/2022), Disp: 60 tablet, Rfl: 0   EPINEPHrine  0.3 mg/0.3 mL IJ SOAJ injection, Inject 0.3 mg into the muscle as needed for anaphylaxis. (Patient not taking: Reported on 07/04/2022), Disp: 2 each, Rfl: 0   escitalopram (LEXAPRO) 20 MG tablet, Take 1 tablet (  20 mg total) by mouth daily., Disp: 90 tablet, Rfl: 3   fluticasone (FLONASE) 50 MCG/ACT nasal spray, Place 1 spray into both nostrils daily. (Patient not taking: Reported on 07/04/2022), Disp: , Rfl:    lidocaine-prilocaine (EMLA) cream, Apply to affected area once (Patient not taking: Reported on 07/04/2022), Disp: 30 g, Rfl: 3   lisinopril (ZESTRIL) 10 MG tablet, Take 10 mg by mouth daily., Disp: , Rfl:    LORazepam (ATIVAN) 0.5 MG tablet, Take 1 tablet (0.5 mg total) by mouth every 8 (eight) hours as needed for anxiety., Disp: 30 tablet, Rfl: 1   ondansetron (ZOFRAN) 8 MG tablet, Take 1 tablet (8 mg total) by mouth every 8 (eight) hours as needed for nausea or vomiting. (Patient not taking: Reported on 07/04/2022), Disp: 30 tablet, Rfl: 1   oxyCODONE (OXY IR/ROXICODONE) 5 MG immediate release tablet, Take 1 tablet (5 mg total) by mouth every 6 (six) hours as needed for severe pain., Disp: 15 tablet, Rfl: 0   pantoprazole (PROTONIX) 40 MG tablet, Take 2 tablets (80 mg total) by mouth daily. (Patient taking differently: Take 40 mg by mouth daily.), Disp: 180 tablet, Rfl: 3   prochlorperazine (COMPAZINE) 10 MG tablet, Take 1 tablet (10 mg total) by mouth every 6 (six) hours as needed for nausea or vomiting. (Patient not taking: Reported on 07/04/2022), Disp: 30 tablet, Rfl: 1  PHYSICAL EXAM: ECOG FS:1 - Symptomatic but completely ambulatory    Vitals:   07/24/22 1305  BP: (!) 151/88  Pulse: 80  Resp: 16  Temp: 98.3 F (36.8 C)  TempSrc: Oral  SpO2: 100%  Weight: (!) 325 lb 8 oz (147.6 kg)   Physical Exam Vitals and nursing note reviewed.  Constitutional:      Appearance: She is well-developed. She is not ill-appearing or toxic-appearing.  HENT:     Head: Normocephalic.     Nose:  Nose normal.     Mouth/Throat:     Mouth: Mucous membranes are moist.     Pharynx: Oropharynx is clear.  Eyes:     Conjunctiva/sclera: Conjunctivae normal.  Neck:     Vascular: No JVD.  Cardiovascular:     Rate and Rhythm: Normal rate and regular rhythm.     Pulses: Normal pulses.     Heart sounds: Normal heart sounds.  Pulmonary:     Effort: Pulmonary effort is normal.     Breath sounds: Normal breath sounds.  Chest:     Comments: PAC in right upper chest Abdominal:     General: There is no distension.     Palpations: Abdomen is soft. There is no mass.     Tenderness: There is no abdominal tenderness. There is no guarding.     Hernia: No hernia is present.  Musculoskeletal:     Cervical back: Normal range of motion.  Skin:    General: Skin is warm and dry.  Neurological:     Mental Status: She is oriented to person, place, and time.        LABORATORY DATA: I have reviewed the data as listed    Latest Ref Rng & Units 07/24/2022   11:44 AM 07/17/2022    1:48 PM 07/07/2022    3:42 PM  CBC  WBC 4.0 - 10.5 K/uL 11.4  8.2  10.5   Hemoglobin 12.0 - 15.0 g/dL 11.3  12.6  11.6   Hematocrit 36.0 - 46.0 % 35.2  40.1  36.8   Platelets 150 - 400 K/uL 142  327  362         Latest Ref Rng & Units 07/24/2022   11:44 AM 07/17/2022    1:48 PM 07/07/2022    3:42 PM  CMP  Glucose 70 - 99 mg/dL 109  107  137   BUN 6 - 20 mg/dL '11  25  14   '$ Creatinine 0.44 - 1.00 mg/dL 0.87  1.05  0.79   Sodium 135 - 145 mmol/L 142  137  142   Potassium 3.5 - 5.1 mmol/L 3.6  4.3  3.5   Chloride 98 - 111 mmol/L 110  107  105   CO2 22 - 32 mmol/L '25  22  31   '$ Calcium 8.9 - 10.3 mg/dL 8.4  8.5  9.0   Total Protein 6.5 - 8.1 g/dL 6.2  6.8  6.9   Total Bilirubin 0.3 - 1.2 mg/dL 0.3  0.4  0.3   Alkaline Phos 38 - 126 U/L 118  104  84   AST 15 - 41 U/L '22  31  20   '$ ALT 0 - 44 U/L 34  40  14        RADIOGRAPHIC STUDIES (from last 24 hours if applicable) I have personally reviewed the radiological  images as listed and agreed with the findings in the report. No results found.      Visit Diagnosis: 1. Diarrhea, unspecified type   2. Malignant neoplasm of upper-inner quadrant of left breast in female, estrogen receptor positive ([place])   3. Port-A-Cath in place      No orders of the defined types were placed in this encounter.   All questions were answered. The patient knows to call the clinic with any problems, questions or concerns. No barriers to learning was detected.  I have spent a total of 30 minutes minutes of face-to-face and non-face-to-face time, preparing to see the patient, obtaining and/or reviewing separately obtained history, performing a medically appropriate examination, counseling and educating the patient, ordering tests, documenting clinical information in the electronic health record, and care coordination (communications with other health care professionals or caregivers).    Thank you for allowing me to participate in the care of this patient.    Barrie Folk, PA-C Department of Hematology/Oncology [facility] Phone: [phone]  [phone]    07/24/2022 3:02 PM

## 2022-07-25 ENCOUNTER — Other Ambulatory Visit: Payer: Self-pay

## 2022-07-25 ENCOUNTER — Encounter: Payer: Self-pay | Admitting: Physician Assistant

## 2022-07-25 DIAGNOSIS — Z170 Estrogen receptor positive status [ER+]: Secondary | ICD-10-CM

## 2022-07-25 DIAGNOSIS — R197 Diarrhea, unspecified: Secondary | ICD-10-CM

## 2022-07-25 DIAGNOSIS — E860 Dehydration: Secondary | ICD-10-CM

## 2022-07-26 ENCOUNTER — Ambulatory Visit: Payer: Commercial Managed Care - PPO

## 2022-07-26 ENCOUNTER — Encounter: Payer: Self-pay | Admitting: Physician Assistant

## 2022-07-26 ENCOUNTER — Other Ambulatory Visit: Payer: Commercial Managed Care - PPO

## 2022-07-26 ENCOUNTER — Ambulatory Visit: Payer: Commercial Managed Care - PPO | Admitting: Hematology

## 2022-07-26 LAB — GI PATHOGEN PANEL BY PCR, STOOL

## 2022-07-27 ENCOUNTER — Other Ambulatory Visit: Payer: Commercial Managed Care - PPO

## 2022-07-27 ENCOUNTER — Ambulatory Visit: Payer: Commercial Managed Care - PPO | Admitting: Adult Health

## 2022-07-27 ENCOUNTER — Ambulatory Visit: Payer: Commercial Managed Care - PPO

## 2022-07-28 ENCOUNTER — Ambulatory Visit: Payer: Commercial Managed Care - PPO

## 2022-07-29 ENCOUNTER — Ambulatory Visit: Payer: Commercial Managed Care - PPO

## 2022-08-01 MED FILL — Dexamethasone Sodium Phosphate Inj 100 MG/10ML: INTRAMUSCULAR | Qty: 1 | Status: AC

## 2022-08-02 ENCOUNTER — Encounter: Payer: Self-pay | Admitting: Hematology and Oncology

## 2022-08-02 ENCOUNTER — Other Ambulatory Visit: Payer: Self-pay

## 2022-08-02 ENCOUNTER — Inpatient Hospital Stay (HOSPITAL_BASED_OUTPATIENT_CLINIC_OR_DEPARTMENT_OTHER): Payer: Commercial Managed Care - PPO | Admitting: Hematology and Oncology

## 2022-08-02 ENCOUNTER — Encounter: Payer: Self-pay | Admitting: *Deleted

## 2022-08-02 ENCOUNTER — Inpatient Hospital Stay: Payer: Commercial Managed Care - PPO

## 2022-08-02 ENCOUNTER — Ambulatory Visit: Payer: Commercial Managed Care - PPO | Admitting: Adult Health

## 2022-08-02 VITALS — HR 100

## 2022-08-02 VITALS — BP 177/83 | HR 104 | Temp 98.8°F | Resp 18 | Ht 63.0 in | Wt 324.6 lb

## 2022-08-02 DIAGNOSIS — Z5112 Encounter for antineoplastic immunotherapy: Secondary | ICD-10-CM | POA: Diagnosis not present

## 2022-08-02 DIAGNOSIS — C50212 Malignant neoplasm of upper-inner quadrant of left female breast: Secondary | ICD-10-CM

## 2022-08-02 DIAGNOSIS — Z95828 Presence of other vascular implants and grafts: Secondary | ICD-10-CM

## 2022-08-02 DIAGNOSIS — Z170 Estrogen receptor positive status [ER+]: Secondary | ICD-10-CM

## 2022-08-02 LAB — CMP (CANCER CENTER ONLY)
ALT: 28 U/L (ref 0–44)
AST: 17 U/L (ref 15–41)
Albumin: 3.9 g/dL (ref 3.5–5.0)
Alkaline Phosphatase: 108 U/L (ref 38–126)
Anion gap: 9 (ref 5–15)
BUN: 19 mg/dL (ref 6–20)
CO2: 27 mmol/L (ref 22–32)
Calcium: 9.4 mg/dL (ref 8.9–10.3)
Chloride: 103 mmol/L (ref 98–111)
Creatinine: 0.71 mg/dL (ref 0.44–1.00)
GFR, Estimated: >60 mL/min (ref >60)
Glucose, Bld: 162 mg/dL — ABNORMAL HIGH (ref 70–99)
Potassium: 4 mmol/L (ref 3.5–5.1)
Sodium: 139 mmol/L (ref 135–145)
Total Bilirubin: 0.3 mg/dL (ref 0.3–1.2)
Total Protein: 7.1 g/dL (ref 6.5–8.1)

## 2022-08-02 LAB — CBC WITH DIFFERENTIAL (CANCER CENTER ONLY)
Abs Immature Granulocytes: 0.08 10*3/uL — ABNORMAL HIGH (ref 0.00–0.07)
Basophils Absolute: 0 10*3/uL (ref 0.0–0.1)
Basophils Relative: 0 %
Eosinophils Absolute: 0 10*3/uL (ref 0.0–0.5)
Eosinophils Relative: 0 %
HCT: 38.4 % (ref 36.0–46.0)
Hemoglobin: 12.1 g/dL (ref 12.0–15.0)
Immature Granulocytes: 1 %
Lymphocytes Relative: 5 %
Lymphs Abs: 0.6 10*3/uL — ABNORMAL LOW (ref 0.7–4.0)
MCH: 24.7 pg — ABNORMAL LOW (ref 26.0–34.0)
MCHC: 31.5 g/dL (ref 30.0–36.0)
MCV: 78.4 fL — ABNORMAL LOW (ref 80.0–100.0)
Monocytes Absolute: 0.2 10*3/uL (ref 0.1–1.0)
Monocytes Relative: 1 %
Neutro Abs: 10.9 10*3/uL — ABNORMAL HIGH (ref 1.7–7.7)
Neutrophils Relative %: 93 %
Platelet Count: 396 10*3/uL (ref 150–400)
RBC: 4.9 MIL/uL (ref 3.87–5.11)
RDW: 17.3 % — ABNORMAL HIGH (ref 11.5–15.5)
WBC Count: 11.8 10*3/uL — ABNORMAL HIGH (ref 4.0–10.5)
nRBC: 0 % (ref 0.0–0.2)

## 2022-08-02 MED ORDER — SODIUM CHLORIDE 0.9 % IV SOLN
420.0000 mg | Freq: Once | INTRAVENOUS | Status: AC
  Administered 2022-08-02: 420 mg via INTRAVENOUS
  Filled 2022-08-02: qty 14

## 2022-08-02 MED ORDER — HEPARIN SOD (PORK) LOCK FLUSH 100 UNIT/ML IV SOLN
500.0000 [IU] | Freq: Once | INTRAVENOUS | Status: AC | PRN
  Administered 2022-08-02: 500 [IU]

## 2022-08-02 MED ORDER — ACETAMINOPHEN 325 MG PO TABS
650.0000 mg | ORAL_TABLET | Freq: Once | ORAL | Status: AC
  Administered 2022-08-02: 650 mg via ORAL
  Filled 2022-08-02: qty 2

## 2022-08-02 MED ORDER — SODIUM CHLORIDE 0.9% FLUSH
10.0000 mL | INTRAVENOUS | Status: DC | PRN
  Administered 2022-08-02: 10 mL

## 2022-08-02 MED ORDER — DIPHENHYDRAMINE HCL 25 MG PO CAPS
50.0000 mg | ORAL_CAPSULE | Freq: Once | ORAL | Status: AC
  Administered 2022-08-02: 50 mg via ORAL
  Filled 2022-08-02: qty 2

## 2022-08-02 MED ORDER — SODIUM CHLORIDE 0.9% FLUSH
10.0000 mL | Freq: Once | INTRAVENOUS | Status: AC
  Administered 2022-08-02: 10 mL

## 2022-08-02 MED ORDER — SODIUM CHLORIDE 0.9 % IV SOLN
60.0000 mg/m2 | Freq: Once | INTRAVENOUS | Status: AC
  Administered 2022-08-02: 160 mg via INTRAVENOUS
  Filled 2022-08-02: qty 16

## 2022-08-02 MED ORDER — SODIUM CHLORIDE 0.9 % IV SOLN: Freq: Once | INTRAVENOUS | Status: AC

## 2022-08-02 MED ORDER — TRASTUZUMAB-ANNS CHEMO 150 MG IV SOLR
6.0000 mg/kg | Freq: Once | INTRAVENOUS | Status: AC
  Administered 2022-08-02: 840 mg via INTRAVENOUS
  Filled 2022-08-02: qty 40

## 2022-08-02 MED ORDER — SODIUM CHLORIDE 0.9 % IV SOLN
10.0000 mg | Freq: Once | INTRAVENOUS | Status: AC
  Administered 2022-08-02: 10 mg via INTRAVENOUS
  Filled 2022-08-02: qty 10

## 2022-08-02 NOTE — Progress Notes (Signed)
[facility] Follow up:    Diamond Fraise, MD [place] [id number]   DIAGNOSIS:  Cancer Staging  Malignant neoplasm of upper-inner quadrant of left breast in female, estrogen receptor positive ([place]) Staging form: Breast, AJCC 8th Edition - Clinical stage from 06/21/2022: Stage IIIB (cT3, cN3, cM0, G3, ER+, PR+, HER2+) - Unsigned Stage prefix: Initial diagnosis Histologic grading system: 3 grade system Stage used in treatment planning: Yes National guidelines used in treatment planning: Yes Type of national guideline used in treatment planning: NCCN   SUMMARY OF ONCOLOGIC HISTORY: Oncology History  Malignant neoplasm of upper-inner quadrant of left breast in female, estrogen receptor positive ([place])  06/06/2022 Mammogram   Diagnostic mammogram given palpable mass showed suspicious spiculated left breast mass with surrounding nodularity at 9 o'clock position.  This measures up to 3.1 cm sonographically however due to deep location and patient body habitus the mammographic measurement of 5.8 cm is felt to be more accurate.  At least 2 abnormal lymph nodes in the left axilla.  Possible abnormal palpable left supraclavicular lymph node.   06/06/2022 Breast US   Breast ultrasound confirmed these findings.   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  Prognostic showed ER 65% weak to strong staining, PR 15% weak to moderate staining, Ki-67 of 50% and HER2 positive by IHC at 3+    Genetic Testing   [facility] Custom Panel+RNA was Negative. Report date is 06/28/2022.   The Custom Hereditary Cancers Panel offered by [facility] includes sequencing and/or deletion duplication testing of the following 43 genes: APC, ATM, AXIN2, BAP1, BARD1, BMPR1A, BRCA1, BRCA2, BRIP1, CDH1, CDK4, CDKN2A (p14ARF and  p16INK4a only), CHEK2, CTNNA1, EPCAM (Deletion/duplication testing only), FH, GREM1 (promoter region duplication testing only), HOXB13, KIT, MBD4, MEN1, MLH1, MSH2, MSH3, MSH6, MUTYH, NF1, NHTL1, PALB2, PDGFRA, PMS2, POLD1, POLE, PTEN, RAD51C, RAD51D, SMAD4, SMARCA4. STK11, TP53, TSC1, TSC2, and VHL.    06/29/2022 PET scan   IMPRESSION: 1. Hypermetabolic left breast mass, compatible with primary breast malignancy. 2. Hypermetabolic left cervical, axillary, subpectoral, supraclavicular and prevascular mediastinal lymph nodes, compatible with nodal metastatic disease. 3. No evidence of metastatic disease in the abdomen or pelvis. 4. Aortic Atherosclerosis (ICD10-I70.0).     Electronically Signed   By: Yetta Glassman M.D.   On: 06/29/2022 09:32   07/11/2022 -  Chemotherapy   Patient is on Treatment Plan : BREAST DOCEtaxel + Trastuzumab + Pertuzumab (THP) q21d x 8 cycles / Trastuzumab + Pertuzumab q21d x 4 cycles       CURRENT THERAPY: THP cycle 2 day 1  INTERVAL HISTORY:  Diamond Marshall 52 y.o. female returns for f/u.  After her first cycle of chemotherapy, she had a really tough time with diarrhea, almost grade 3 diarrhea, took imodium about 8 caps a day. She had a couple episode of nausea, one episode of vomiting, nothing significant. No neuropathy.   She is here before her planned cycle 2-day 1 of docetaxel Herceptin and pertuzumab She is very encouraged today, she says the lymph node in the neck is not palpable anymore. Breast mass is not as conspicous. Rest of the pertinent 10 point ROS reviewed and neg.  Patient Active Problem List   Diagnosis Date Noted   Port-A-Cath in place 07/07/2022   Genetic testing 06/29/2022   Malignant neoplasm of  upper-inner quadrant of left breast in female, estrogen receptor positive ([place]) 06/19/2022   Chronic pain of right knee 03/27/2022   Essential hypertension 03/27/2022   Positive self-administered antigen test for COVID-19 03/15/2021    Prediabetes 07/22/2019   Anxiety and depression 03/15/2016   Vitamin D deficiency 0000000   Metabolic syndrome X [id number]   Elevated random blood glucose level 08/07/2013   Hypertriglyceridemia 08/07/2013   ADD (attention deficit disorder) 07/24/2013    hyperlipidemia 10/09/2012   Morbid obesity ([place]) 10/09/2012   Hypothyroidism 10/09/2012   B12 deficiency 10/09/2012   Anemia, unspecified 02/12/2011   Dysthymic disorder 02/12/2011   Migraine headache 02/12/2011   Obsessive-compulsive disorder 02/12/2011   Tinnitus 02/12/2011    is allergic to misc. sulfonamide containing compounds, phentermine, bee venom, peanut allergen powder-dnfp, topiramate, septra [sulfamethoxazole-trimethoprim], and sulfa antibiotics.  MEDICAL HISTORY: Past Medical History:  Diagnosis Date   Abdominal hernia    Anemia    b12 deficiency   Anxiety    Arthritis    B12 deficiency    Back pain    Cancer (HCC)    breast cancer   Depression    Family history of adverse reaction to anesthesia    mother vomits after anesthesia   Gallbladder problem    GERD (gastroesophageal reflux disease)    History of hiatal hernia    "small" per patient   Hypertension    Hypothyroidism    Joint pain    Knee pain    Palpitation    "when iron is low"   Pernicious anemia    SOB (shortness of breath)    Thyroid disease    hypothyroidism   Vitamin D deficiency     SURGICAL HISTORY: Past Surgical History:  Procedure Laterality Date   BREAST BIOPSY Left 06/09/2022   [place] LT BREAST BX W LOC DEV 1ST LESION IMG BX [place] US GUIDE 06/09/2022 [facility] MAMMOGRAPHY   CESAREAN SECTION  1999   Dilatation and currettagement     for miscarriage 18 years ago   PORTACATH PLACEMENT Right 07/05/2022   Procedure: INSERTION PORT-A-CATH;  Surgeon: Erroll Luna, MD;  Location: [facility] OR;  Service: General;  Laterality: Right;    SOCIAL HISTORY: Social History   Socioeconomic History   Marital status: Married    Spouse name: Ronalee Belts    Number of children: Not on file   Years of education: Not on file   Highest education level: Not on file  Occupational History   Occupation: Adult Water engineer  Tobacco Use   Smoking status: Never   Smokeless tobacco: Never  Vaping Use   Vaping Use: Never used  Substance and Sexual Activity   Alcohol use: No   Drug use: No   Sexual activity: Yes    Birth control/protection: Pill  Other Topics Concern   Not on file  Social History Narrative   Not on file   Social Determinants of Health   Financial Resource Strain: Not on file  Food Insecurity: No Food Insecurity (06/21/2022)   Hunger Vital Sign    Worried About Running Out of Food in the Last Year: Never true    Ran Out of Food in the Last Year: Never true  Transportation Needs: No Transportation Needs (06/21/2022)   PRAPARE - Hydrologist (Medical): No    Lack of Transportation (Non-Medical): No  Physical Activity: Not on file  Stress: Not on file  Social Connections: Not on file  Intimate Partner Violence: Not on file  FAMILY HISTORY: Family History  Problem Relation Age of Onset   Hypertension Mother    Diabetes Mother    High Cholesterol Mother    Thyroid disease Mother    Depression Mother    Anxiety disorder Mother    Obesity Mother    CVA Father        hemorrhagic   Ovarian cancer Maternal Aunt 43   Colon cancer Maternal Aunt    Breast cancer Cousin 24       maternal first cousin, reports negative genetic testing    Review of Systems  Constitutional:  Positive for fatigue. Negative for appetite change, chills, fever and unexpected weight change.  HENT:   Negative for hearing loss, lump/mass, mouth sores and trouble swallowing.   Eyes:  Negative for eye problems and icterus.  Respiratory:  Negative for chest tightness, cough and shortness of breath.   Cardiovascular:  Negative for chest pain, leg swelling and palpitations.  Gastrointestinal:  Positive for  diarrhea, nausea and vomiting (X 1 nausea controlled with antinausea medications). Negative for abdominal distention, abdominal pain, blood in stool, constipation and rectal pain.  Endocrine: Negative for hot flashes.  Genitourinary:  Negative for difficulty urinating.   Musculoskeletal:  Negative for arthralgias.  Skin:  Negative for itching and rash.  Neurological:  Negative for dizziness, extremity weakness, headaches, light-headedness and numbness.  Hematological:  Negative for adenopathy. Does not bruise/bleed easily.  Psychiatric/Behavioral:  Negative for depression. The patient is not nervous/anxious.       PHYSICAL EXAMINATION  ECOG PERFORMANCE STATUS: 2 - Symptomatic, <50% confined to bed  Vitals:   08/02/22 1146  BP: (!) 177/83  Pulse: (!) 104  Resp: 18  Temp: 98.8 F (37.1 C)  SpO2: 99%    Physical Exam Constitutional:      General: She is not in acute distress.    Appearance: Normal appearance. She is not ill-appearing or toxic-appearing.  HENT:     Head: Normocephalic and atraumatic.     Mouth/Throat:     Mouth: Mucous membranes are moist.     Pharynx: Oropharynx is clear.  Eyes:     General: No scleral icterus. Cardiovascular:     Rate and Rhythm: Regular rhythm.     Pulses: Normal pulses.     Heart sounds: Normal heart sounds.  Pulmonary:     Effort: Pulmonary effort is normal.     Breath sounds: Normal breath sounds.  Abdominal:     General: Abdomen is flat. Bowel sounds are normal. There is no distension.     Palpations: Abdomen is soft.     Tenderness: There is no abdominal tenderness.  Musculoskeletal:        General: No swelling.     Cervical back: Neck supple.  Lymphadenopathy:     Cervical: No cervical adenopathy.  Skin:    General: Skin is warm and dry.     Findings: No rash.  Neurological:     General: No focal deficit present.     Mental Status: She is alert.  Psychiatric:        Mood and Affect: Mood normal.        Behavior:  Behavior normal.     LABORATORY DATA:  CBC    Component Value Date/Time   WBC 11.8 (H) 08/02/2022 1126   WBC 19.1 (H) 10/27/2019 2208   RBC 4.90 08/02/2022 1126   HGB 12.1 08/02/2022 1126   HGB 12.0 07/21/2021 1511   HCT 38.4 08/02/2022 1126  HCT 37.6 07/21/2021 1511   PLT 396 08/02/2022 1126   PLT 359 07/21/2021 1511   MCV 78.4 (L) 08/02/2022 1126   MCV 76 (L) 07/21/2021 1511   MCH 24.7 (L) 08/02/2022 1126   MCHC 31.5 08/02/2022 1126   RDW 17.3 (H) 08/02/2022 1126   RDW 15.7 (H) 07/21/2021 1511   LYMPHSABS 0.6 (L) 08/02/2022 1126   LYMPHSABS 1.9 07/21/2021 1511   MONOABS 0.2 08/02/2022 1126   EOSABS 0.0 08/02/2022 1126   EOSABS 0.2 07/21/2021 1511   BASOSABS 0.0 08/02/2022 1126   BASOSABS 0.1 07/21/2021 1511    CMP     Component Value Date/Time   NA 139 08/02/2022 1126   NA 139 03/28/2022 0811   K 4.0 08/02/2022 1126   CL 103 08/02/2022 1126   CO2 27 08/02/2022 1126   GLUCOSE 162 (H) 08/02/2022 1126   BUN 19 08/02/2022 1126   BUN 14 03/28/2022 0811   CREATININE 0.71 08/02/2022 1126   CREATININE 0.74 10/09/2012 1537   CALCIUM 9.4 08/02/2022 1126   PROT 7.1 08/02/2022 1126   PROT 7.1 03/28/2022 0811   ALBUMIN 3.9 08/02/2022 1126   ALBUMIN 4.2 03/28/2022 0811   AST 17 08/02/2022 1126   ALT 28 08/02/2022 1126   ALKPHOS 108 08/02/2022 1126   BILITOT 0.3 08/02/2022 1126   GFRNONAA >60 08/02/2022 1126   GFRNONAA >89 10/09/2012 1537   GFRAA 96 05/18/2020 1442   GFRAA >89 10/09/2012 1537       ASSESSMENT and THERAPY PLAN:   Malignant neoplasm of upper-inner quadrant of left breast in female, estrogen receptor positive ([place]) This is a very pleasant 52 year old perimenopausal female patient with 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 She felt this breast lump back in August 2023 however thought that this is likely benign and hence did not pursue a  mammogram immediately.  She had her mammogram recently in January when she noticed some skin changes in that area.  She also had some CT neck because of swelling in the left neck which was thought to be reactive lymphadenopathy although she was told that this fatty lipoma.  She also reports some dry cough but attributed this to lisinopril.  Physical examination today noticed large left breast and a quadrant mass measuring almost 6 cm in largest dimension, lateral margin not palpable, palpable regional adenopathy and palpable nonregional left lower cervical/supraclavicular adenopathy.  PET unfortunately showed mediastinal adenopathy and non regional cervical adenopathy consistent with met disease. She started first cycle of docetaxel/Herceptin and pertuzumab with palliative intent.  She had a really tough time after the first cycle with diarrhea, required multiple visits, intravenous fluids.  She is here before her planned cycle 2-day 1.  Given poor tolerance to full dose chemo, have reduced the docetaxel dose to 60 mg/m.  If she still continues to have issues with diarrhea, I will omit pertuzumab from next cycle onwards. Clinically, she had great response with resolution of lymphadenopathy in the left neck, and also significant improvement in the left breast mass.  We will also plan to repeat imaging after about 3 cycles of chemotherapy. Benay Pike MD Ok to proceed today if labs are within parameters.  All questions were answered. The patient knows to call the clinic with any problems, questions or concerns. We can certainly see the patient much sooner if necessary.  Total encounter time:30 minutes*in face-to-face visit time, chart review, lab review, care  coordination, order entry, and documentation of the encounter time.    *Total Encounter Time as defined by the Centers for Medicare and Medicaid Services includes, in addition to the face-to-face time of a patient visit (documented in the note  above) non-face-to-face time: obtaining and reviewing outside history, ordering and reviewing medications, tests or procedures, care coordination (communications with other health care professionals or caregivers) and documentation in the medical record.

## 2022-08-02 NOTE — Assessment & Plan Note (Addendum)
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 She felt this breast lump back in August 2023 however thought that this is likely benign and hence did not pursue a mammogram immediately.  She had her mammogram recently in January when she noticed some skin changes in that area.  She also had some CT neck because of swelling in the left neck which was thought to be reactive lymphadenopathy although she was told that this fatty lipoma.  She also reports some dry cough but attributed this to lisinopril.  Physical examination today noticed large left breast and a quadrant mass measuring almost 6 cm in largest dimension, lateral margin not palpable, palpable regional adenopathy and palpable nonregional left lower cervical/supraclavicular adenopathy.  PET unfortunately showed mediastinal adenopathy and non regional cervical adenopathy consistent with met disease. She started first cycle of docetaxel/Herceptin and pertuzumab with palliative intent.  She had a really tough time after the first cycle with diarrhea, required multiple visits, intravenous fluids.  She is here before her planned cycle 2-day 1.  Given poor tolerance to full dose chemo, have reduced the docetaxel dose to 60 mg/m.  If she still continues to have issues with diarrhea, I will omit pertuzumab from next cycle onwards. Clinically, she had great response with resolution of lymphadenopathy in the left neck, and also significant improvement in the left breast mass.  We will also plan to repeat imaging after about 3 cycles of chemotherapy. Benay Pike MD

## 2022-08-02 NOTE — Patient Instructions (Signed)
[facility]  Discharge Instructions: Thank you for choosing [facility] to provide your oncology and hematology care.   If you have a lab appointment with the Cancer Center, please go directly to the Cancer Center and check in at the registration area.   Wear comfortable clothing and clothing appropriate for easy access to any Portacath or PICC line.   We strive to give you quality time with your provider. You may need to reschedule your appointment if you arrive late (15 or more minutes).  Arriving late affects you and other patients whose appointments are after yours.  Also, if you miss three or more appointments without notifying the office, you may be dismissed from the clinic at the provider's discretion.      For prescription refill requests, have your pharmacy contact our office and allow 72 hours for refills to be completed.    Today you received the following chemotherapy and/or immunotherapy agents: Kanjinti, Perjeta, Taxol      To help prevent nausea and vomiting after your treatment, we encourage you to take your nausea medication as directed.  BELOW ARE SYMPTOMS THAT SHOULD BE REPORTED IMMEDIATELY: *FEVER GREATER THAN 100.4 F (38 C) OR HIGHER *CHILLS OR SWEATING *NAUSEA AND VOMITING THAT IS NOT CONTROLLED WITH YOUR NAUSEA MEDICATION *UNUSUAL SHORTNESS OF BREATH *UNUSUAL BRUISING OR BLEEDING *URINARY PROBLEMS (pain or burning when urinating, or frequent urination) *BOWEL PROBLEMS (unusual diarrhea, constipation, pain near the anus) TENDERNESS IN MOUTH AND THROAT WITH OR WITHOUT PRESENCE OF ULCERS (sore throat, sores in mouth, or a toothache) UNUSUAL RASH, SWELLING OR PAIN  UNUSUAL VAGINAL DISCHARGE OR ITCHING   Items with * indicate a potential emergency and should be followed up as soon as possible or go to the Emergency Department if any problems should occur.  Please show the CHEMOTHERAPY ALERT CARD or IMMUNOTHERAPY  ALERT CARD at check-in to the Emergency Department and triage nurse.  Should you have questions after your visit or need to cancel or reschedule your appointment, please contact [facility]  Dept: [phone]  and follow the prompts.  Office hours are 8:00 a.m. to 4:30 p.m. Monday - Friday. Please note that voicemails left after 4:00 p.m. may not be returned until the following business day.  We are closed weekends and major holidays. You have access to a nurse at all times for urgent questions. Please call the main number to the clinic Dept: [phone] and follow the prompts.   For any non-urgent questions, you may also contact your provider using MyChart. We now offer e-Visits for anyone 18 and older to request care online for non-urgent symptoms. For details visit [email or web address].   Also download the MyChart app! Go to the app store, search "MyChart", open the app, select [facility], and log in with your MyChart username and password.  Pertuzumab Injection What is this medication? PERTUZUMAB (per TOOZ ue mab) treats breast cancer. It works by blocking a protein that causes cancer cells to grow and multiply. This helps to slow or stop the spread of cancer cells. It is a monoclonal antibody. This medicine may be used for other purposes; ask your health care provider or pharmacist if you have questions. COMMON BRAND NAME(S): PERJETA What should I tell my care team before I take this medication? They need to know if you have any of these conditions: Heart failure An unusual or allergic reaction to pertuzumab, other medications, foods, dyes, or   preservatives Pregnant or trying to get pregnant Breast-feeding How should I use this medication? This medication is injected into a vein. It is given by your care team in a hospital or clinic setting. Talk to your care team about the use of this medication in children. Special care may be  needed. Overdosage: If you think you have taken too much of this medicine contact a poison control center or emergency room at once. NOTE: This medicine is only for you. Do not share this medicine with others. What if I miss a dose? Keep appointments for follow-up doses. It is important not to miss your dose. Call your care team if you are unable to keep an appointment. What may interact with this medication? Interactions are not expected. This list may not describe all possible interactions. Give your health care provider a list of all the medicines, herbs, non-prescription drugs, or dietary supplements you use. Also tell them if you smoke, drink alcohol, or use illegal drugs. Some items may interact with your medicine. What should I watch for while using this medication? Your condition will be monitored carefully while you are receiving this medication. This medication may make you feel generally unwell. This is not uncommon as chemotherapy can affect healthy cells as well as cancer cells. Report any side effects. Continue your course of treatment even though you feel ill unless your care team tells you to stop. Talk to your care team if you may be pregnant. Serious birth defects can occur if you take this medication during pregnancy and for 7 months after the last dose. You will need a negative pregnancy test before starting this medication. Contraception is recommended while taking this medication and for 7 months after the last dose. Your care team can help you find the option that works for you. Do not breastfeed while taking this medication and for 7 months after the last dose. What side effects may I notice from receiving this medication? Side effects that you should report to your care team as soon as possible: Allergic reactions or angioedema--skin rash, itching or hives, swelling of the face, eyes, lips, tongue, arms, or legs, trouble swallowing or breathing Heart failure--shortness of  breath, swelling of the ankles, feet, or hands, sudden weight gain, unusual weakness or fatigue Infusion reactions--chest pain, shortness of breath or trouble breathing, feeling faint or lightheaded Side effects that usually do not require medical attention (report to your care team if they continue or are bothersome): Diarrhea Dry skin Fatigue Hair loss Nausea Vomiting This list may not describe all possible side effects. Call your doctor for medical advice about side effects. You may report side effects to FDA at 1-800-FDA-1088. Where should I keep my medication? This medication is given in a hospital or clinic. It will not be stored at home. NOTE: This sheet is a summary. It may not cover all possible information. If you have questions about this medicine, talk to your doctor, pharmacist, or health care provider.  2023 Elsevier/Gold Standard (2021-09-20 00:00:00)  Trastuzumab Injection What is this medication? TRASTUZUMAB (tras TOO zoo mab) treats breast cancer and stomach cancer. It works by blocking a protein that causes cancer cells to grow and multiply. This helps to slow or stop the spread of cancer cells. This medicine may be used for other purposes; ask your health care provider or pharmacist if you have questions. COMMON BRAND NAME(S): Herceptin, Herzuma, KANJINTI, Ogivri, Ontruzant, Trazimera What should I tell my care team before I take this medication? They   need to know if you have any of these conditions: Heart failure Lung disease An unusual or allergic reaction to trastuzumab, other medications, foods, dyes, or preservatives Pregnant or trying to get pregnant Breast-feeding How should I use this medication? This medication is injected into a vein. It is given by your care team in a hospital or clinic setting. Talk to your care team about the use of this medication in children. It is not approved for use in children. Overdosage: If you think you have taken too much of  this medicine contact a poison control center or emergency room at once. NOTE: This medicine is only for you. Do not share this medicine with others. What if I miss a dose? Keep appointments for follow-up doses. It is important not to miss your dose. Call your care team if you are unable to keep an appointment. What may interact with this medication? Certain types of chemotherapy, such as daunorubicin, doxorubicin, epirubicin, idarubicin This list may not describe all possible interactions. Give your health care provider a list of all the medicines, herbs, non-prescription drugs, or dietary supplements you use. Also tell them if you smoke, drink alcohol, or use illegal drugs. Some items may interact with your medicine. What should I watch for while using this medication? Your condition will be monitored carefully while you are receiving this medication. This medication may make you feel generally unwell. This is not uncommon, as chemotherapy affects healthy cells as well as cancer cells. Report any side effects. Continue your course of treatment even though you feel ill unless your care team tells you to stop. This medication may increase your risk of getting an infection. Call your care team for advice if you get a fever, chills, sore throat, or other symptoms of a cold or flu. Do not treat yourself. Try to avoid being around people who are sick. Avoid taking medications that contain aspirin, acetaminophen, ibuprofen, naproxen, or ketoprofen unless instructed by your care team. These medications can hide a fever. Talk to your care team if you may be pregnant. Serious birth defects can occur if you take this medication during pregnancy and for 7 months after the last dose. You will need a negative pregnancy test before starting this medication. Contraception is recommended while taking this medication and for 7 months after the last dose. Your care team can help you find the option that works for  you. Do not breastfeed while taking this medication and for 7 months after stopping treatment. What side effects may I notice from receiving this medication? Side effects that you should report to your care team as soon as possible: Allergic reactions or angioedema--skin rash, itching or hives, swelling of the face, eyes, lips, tongue, arms, or legs, trouble swallowing or breathing Dry cough, shortness of breath or trouble breathing Heart failure--shortness of breath, swelling of the ankles, feet, or hands, sudden weight gain, unusual weakness or fatigue Infection--fever, chills, cough, or sore throat Infusion reactions--chest pain, shortness of breath or trouble breathing, feeling faint or lightheaded Side effects that usually do not require medical attention (report to your care team if they continue or are bothersome): Diarrhea Dizziness Headache Nausea Trouble sleeping Vomiting This list may not describe all possible side effects. Call your doctor for medical advice about side effects. You may report side effects to FDA at 1-800-FDA-1088. Where should I keep my medication? This medication is given in a hospital or clinic. It will not be stored at home. NOTE: This sheet is a   summary. It may not cover all possible information. If you have questions about this medicine, talk to your doctor, pharmacist, or health care provider.  2023 Elsevier/Gold Standard (2021-09-08 00:00:00)  Paclitaxel Injection What is this medication? PACLITAXEL (PAK li TAX el) treats some types of cancer. It works by slowing down the growth of cancer cells. This medicine may be used for other purposes; ask your health care provider or pharmacist if you have questions. COMMON BRAND NAME(S): Onxol, Taxol What should I tell my care team before I take this medication? They need to know if you have any of these conditions: Heart disease Liver disease Low white blood cell levels An unusual or allergic reaction to  paclitaxel, other medications, foods, dyes, or preservatives If you or your partner are pregnant or trying to get pregnant Breast-feeding How should I use this medication? This medication is injected into a vein. It is given by your care team in a hospital or clinic setting. Talk to your care team about the use of this medication in children. While it may be given to children for selected conditions, precautions do apply. Overdosage: If you think you have taken too much of this medicine contact a poison control center or emergency room at once. NOTE: This medicine is only for you. Do not share this medicine with others. What if I miss a dose? Keep appointments for follow-up doses. It is important not to miss your dose. Call your care team if you are unable to keep an appointment. What may interact with this medication? Do not take this medication with any of the following: Live virus vaccines Other medications may affect the way this medication works. Talk with your care team about all of the medications you take. They may suggest changes to your treatment plan to lower the risk of side effects and to make sure your medications work as intended. This list may not describe all possible interactions. Give your health care provider a list of all the medicines, herbs, non-prescription drugs, or dietary supplements you use. Also tell them if you smoke, drink alcohol, or use illegal drugs. Some items may interact with your medicine. What should I watch for while using this medication? Your condition will be monitored carefully while you are receiving this medication. You may need blood work while taking this medication. This medication may make you feel generally unwell. This is not uncommon as chemotherapy can affect healthy cells as well as cancer cells. Report any side effects. Continue your course of treatment even though you feel ill unless your care team tells you to stop. This medication can cause  serious allergic reactions. To reduce the risk, your care team may give you other medications to take before receiving this one. Be sure to follow the directions from your care team. This medication may increase your risk of getting an infection. Call your care team for advice if you get a fever, chills, sore throat, or other symptoms of a cold or flu. Do not treat yourself. Try to avoid being around people who are sick. This medication may increase your risk to bruise or bleed. Call your care team if you notice any unusual bleeding. Be careful brushing or flossing your teeth or using a toothpick because you may get an infection or bleed more easily. If you have any dental work done, tell your dentist you are receiving this medication. Talk to your care team if you may be pregnant. Serious birth defects can occur if you take this   medication during pregnancy. Talk to your care team before breastfeeding. Changes to your treatment plan may be needed. What side effects may I notice from receiving this medication? Side effects that you should report to your care team as soon as possible: Allergic reactions--skin rash, itching, hives, swelling of the face, lips, tongue, or throat Heart rhythm changes--fast or irregular heartbeat, dizziness, feeling faint or lightheaded, chest pain, trouble breathing Increase in blood pressure Infection--fever, chills, cough, sore throat, wounds that don't heal, pain or trouble when passing urine, general feeling of discomfort or being unwell Low blood pressure--dizziness, feeling faint or lightheaded, blurry vision Low red blood cell level--unusual weakness or fatigue, dizziness, headache, trouble breathing Painful swelling, warmth, or redness of the skin, blisters or sores at the infusion site Pain, tingling, or numbness in the hands or feet Slow heartbeat--dizziness, feeling faint or lightheaded, confusion, trouble breathing, unusual weakness or fatigue Unusual bruising  or bleeding Side effects that usually do not require medical attention (report to your care team if they continue or are bothersome): Diarrhea Hair loss Joint pain Loss of appetite Muscle pain Nausea Vomiting This list may not describe all possible side effects. Call your doctor for medical advice about side effects. You may report side effects to FDA at 1-800-FDA-1088. Where should I keep my medication? This medication is given in a hospital or clinic. It will not be stored at home. NOTE: This sheet is a summary. It may not cover all possible information. If you have questions about this medicine, talk to your doctor, pharmacist, or health care provider.  2023 Elsevier/Gold Standard (2021-09-07 00:00:00)   

## 2022-08-03 ENCOUNTER — Encounter: Payer: Self-pay | Admitting: Hematology and Oncology

## 2022-08-03 ENCOUNTER — Telehealth: Payer: Self-pay | Admitting: Dietician

## 2022-08-03 ENCOUNTER — Inpatient Hospital Stay: Payer: Commercial Managed Care - PPO | Admitting: Dietician

## 2022-08-03 NOTE — Telephone Encounter (Signed)
Nutrition Assessment   Reason for Assessment:    ASSESSMENT: 52 year old with malignant neoplasm of left breast, estrogen receptor positive. She is receiving chemotherapy with THP q21d (first 2/20). Dose reduced docetaxel starting cycle 2. Patient is under the care of Dr. Chryl Heck.  Past medical history includes anxiety, depression, metabolic syndrome, ADD, hypothyroidism, B12 deficiency, anemia, OCD, tinnitus, migraine, dysthymic disorder, HLD, HTN, morbid obesity, vit D deficiency, GERD  Spoke with patient via telephone. Patient reports feeling tired after treatment yesterday. She reports her stomach grumbled last night but has not had diarrhea. Patient took lomotil as well as [place]. Patient feels steroids and antidiarrheals are working. She reports she has been rather hungry and eating well secondary to steroids. Patient typically eats 3 meals daily with occasional snacks. Last night she had cheese pizza. Recalls 3 eggs, sausage and cheese for breakfast. Lunch meals are usually left overs (chicken salad, soup, pasta. She is drinking mostly water with added flavor as well as gatorlite.    Nutrition Focused Physical Exam: unable to complete (telephone visit)   Medications: cholestyramine, B12 IM, decadron, voltaren, lomotil, lexapro, zestril, ativan, methylprednisolone, zofran, oxycodone, protonix, compazine    Labs: 3/13 - glucose 162   Anthropometrics:   Height: 5'3" Weight: 324 lb 9.6 oz  UBW: 336 lb (Dec 2023) BMI: 57.50    NUTRITION DIAGNOSIS: Altered GI function related to poor tolerance to full dose chemotherapy as evidenced by reported 2.5 weeks of diarrhea requiring multiple visits, supportive IVF   INTERVENTION:  Discussed strategies for diarrhea, foods best tolerated and foods to limit/avoid Encouraged small frequent meals/snacks vs 3 larger meals  Educated on foods with protein, encouraged protein source with all meals Discussed strategies for nausea Discussed  importance of hydration, encouraged cup of fluid per episode of diarrhea Take antiemetics and antidiarrheals per MD  Reviewed food safety Handouts emailed to patient per request   MONITORING, EVALUATION, GOAL: Patient will tolerate adequate calories and protein to minimize weight loss during treatment   Next Visit: No follow-up scheduled at this time. Patient encouraged to contact with nutrition questions/concerns

## 2022-08-04 ENCOUNTER — Inpatient Hospital Stay: Payer: Commercial Managed Care - PPO

## 2022-08-04 ENCOUNTER — Other Ambulatory Visit: Payer: Self-pay

## 2022-08-04 VITALS — BP 141/90 | HR 101 | Temp 98.8°F | Resp 20

## 2022-08-04 DIAGNOSIS — Z170 Estrogen receptor positive status [ER+]: Secondary | ICD-10-CM

## 2022-08-04 DIAGNOSIS — Z5112 Encounter for antineoplastic immunotherapy: Secondary | ICD-10-CM | POA: Diagnosis not present

## 2022-08-04 MED ORDER — PEGFILGRASTIM-CBQV 6 MG/0.6ML ~~LOC~~ SOSY
6.0000 mg | PREFILLED_SYRINGE | Freq: Once | SUBCUTANEOUS | Status: AC
  Administered 2022-08-04: 6 mg via SUBCUTANEOUS
  Filled 2022-08-04: qty 0.6

## 2022-08-07 ENCOUNTER — Encounter: Payer: Self-pay | Admitting: *Deleted

## 2022-08-07 ENCOUNTER — Encounter: Payer: Self-pay | Admitting: Hematology and Oncology

## 2022-08-14 ENCOUNTER — Other Ambulatory Visit: Payer: Self-pay | Admitting: *Deleted

## 2022-08-14 ENCOUNTER — Encounter: Payer: Self-pay | Admitting: Hematology and Oncology

## 2022-08-15 ENCOUNTER — Encounter: Payer: Self-pay | Admitting: Hematology and Oncology

## 2022-08-15 ENCOUNTER — Other Ambulatory Visit: Payer: Self-pay | Admitting: Hematology and Oncology

## 2022-08-15 ENCOUNTER — Other Ambulatory Visit: Payer: Self-pay | Admitting: *Deleted

## 2022-08-15 DIAGNOSIS — Z170 Estrogen receptor positive status [ER+]: Secondary | ICD-10-CM

## 2022-08-21 ENCOUNTER — Encounter: Payer: Self-pay | Admitting: Hematology and Oncology

## 2022-08-21 MED FILL — Dexamethasone Sodium Phosphate Inj 100 MG/10ML: INTRAMUSCULAR | Qty: 1 | Status: AC

## 2022-08-22 ENCOUNTER — Ambulatory Visit (HOSPITAL_COMMUNITY)
Admission: RE | Admit: 2022-08-22 | Discharge: 2022-08-22 | Disposition: A | Discharge status: Home / Self Care | Payer: Commercial Managed Care - PPO | Source: Ambulatory Visit | Attending: Physician Assistant | Admitting: Physician Assistant

## 2022-08-22 ENCOUNTER — Inpatient Hospital Stay (HOSPITAL_BASED_OUTPATIENT_CLINIC_OR_DEPARTMENT_OTHER): Payer: Commercial Managed Care - PPO | Admitting: Physician Assistant

## 2022-08-22 ENCOUNTER — Inpatient Hospital Stay: Payer: Commercial Managed Care - PPO | Attending: Hematology and Oncology

## 2022-08-22 ENCOUNTER — Other Ambulatory Visit: Payer: Self-pay

## 2022-08-22 ENCOUNTER — Other Ambulatory Visit: Payer: Self-pay | Admitting: Physician Assistant

## 2022-08-22 ENCOUNTER — Inpatient Hospital Stay: Payer: Commercial Managed Care - PPO

## 2022-08-22 VITALS — BP 146/72 | HR 80 | Resp 20

## 2022-08-22 VITALS — BP 157/91 | HR 105 | Temp 99.7°F | Resp 15 | Wt 329.5 lb

## 2022-08-22 DIAGNOSIS — I10 Essential (primary) hypertension: Secondary | ICD-10-CM | POA: Diagnosis not present

## 2022-08-22 DIAGNOSIS — R5383 Other fatigue: Secondary | ICD-10-CM | POA: Insufficient documentation

## 2022-08-22 DIAGNOSIS — C50212 Malignant neoplasm of upper-inner quadrant of left female breast: Secondary | ICD-10-CM

## 2022-08-22 DIAGNOSIS — Z8041 Family history of malignant neoplasm of ovary: Secondary | ICD-10-CM | POA: Insufficient documentation

## 2022-08-22 DIAGNOSIS — Z5112 Encounter for antineoplastic immunotherapy: Secondary | ICD-10-CM | POA: Insufficient documentation

## 2022-08-22 DIAGNOSIS — Z95828 Presence of other vascular implants and grafts: Secondary | ICD-10-CM | POA: Insufficient documentation

## 2022-08-22 DIAGNOSIS — E039 Hypothyroidism, unspecified: Secondary | ICD-10-CM | POA: Insufficient documentation

## 2022-08-22 DIAGNOSIS — Z170 Estrogen receptor positive status [ER+]: Secondary | ICD-10-CM

## 2022-08-22 DIAGNOSIS — Z91030 Bee allergy status: Secondary | ICD-10-CM | POA: Insufficient documentation

## 2022-08-22 DIAGNOSIS — Z79899 Other long term (current) drug therapy: Secondary | ICD-10-CM | POA: Insufficient documentation

## 2022-08-22 DIAGNOSIS — T451X5D Adverse effect of antineoplastic and immunosuppressive drugs, subsequent encounter: Secondary | ICD-10-CM | POA: Insufficient documentation

## 2022-08-22 DIAGNOSIS — Z800 Family history of malignant neoplasm of digestive organs: Secondary | ICD-10-CM | POA: Insufficient documentation

## 2022-08-22 DIAGNOSIS — Z452 Encounter for adjustment and management of vascular access device: Secondary | ICD-10-CM | POA: Diagnosis not present

## 2022-08-22 DIAGNOSIS — Z803 Family history of malignant neoplasm of breast: Secondary | ICD-10-CM | POA: Insufficient documentation

## 2022-08-22 DIAGNOSIS — R112 Nausea with vomiting, unspecified: Secondary | ICD-10-CM | POA: Insufficient documentation

## 2022-08-22 DIAGNOSIS — Z5189 Encounter for other specified aftercare: Secondary | ICD-10-CM | POA: Insufficient documentation

## 2022-08-22 DIAGNOSIS — Z7952 Long term (current) use of systemic steroids: Secondary | ICD-10-CM | POA: Insufficient documentation

## 2022-08-22 DIAGNOSIS — R197 Diarrhea, unspecified: Secondary | ICD-10-CM | POA: Diagnosis not present

## 2022-08-22 DIAGNOSIS — I700 Atherosclerosis of aorta: Secondary | ICD-10-CM | POA: Diagnosis not present

## 2022-08-22 LAB — CMP (CANCER CENTER ONLY)
ALT: 19 U/L (ref 0–44)
AST: 15 U/L (ref 15–41)
Albumin: 3.8 g/dL (ref 3.5–5.0)
Alkaline Phosphatase: 112 U/L (ref 38–126)
Anion gap: 7 (ref 5–15)
BUN: 11 mg/dL (ref 6–20)
CO2: 26 mmol/L (ref 22–32)
Calcium: 9.5 mg/dL (ref 8.9–10.3)
Chloride: 106 mmol/L (ref 98–111)
Creatinine: 0.71 mg/dL (ref 0.44–1.00)
GFR, Estimated: >60 mL/min (ref >60)
Glucose, Bld: 223 mg/dL — ABNORMAL HIGH (ref 70–99)
Potassium: 3.8 mmol/L (ref 3.5–5.1)
Sodium: 139 mmol/L (ref 135–145)
Total Bilirubin: 0.4 mg/dL (ref 0.3–1.2)
Total Protein: 6.9 g/dL (ref 6.5–8.1)

## 2022-08-22 LAB — CBC WITH DIFFERENTIAL (CANCER CENTER ONLY)
Abs Immature Granulocytes: 0.12 10*3/uL — ABNORMAL HIGH (ref 0.00–0.07)
Basophils Absolute: 0 10*3/uL (ref 0.0–0.1)
Basophils Relative: 0 %
Eosinophils Absolute: 0 10*3/uL (ref 0.0–0.5)
Eosinophils Relative: 0 %
HCT: 32.8 % — ABNORMAL LOW (ref 36.0–46.0)
Hemoglobin: 10.5 g/dL — ABNORMAL LOW (ref 12.0–15.0)
Immature Granulocytes: 1 %
Lymphocytes Relative: 7 %
Lymphs Abs: 0.7 10*3/uL (ref 0.7–4.0)
MCH: 25.2 pg — ABNORMAL LOW (ref 26.0–34.0)
MCHC: 32 g/dL (ref 30.0–36.0)
MCV: 78.7 fL — ABNORMAL LOW (ref 80.0–100.0)
Monocytes Absolute: 0.2 10*3/uL (ref 0.1–1.0)
Monocytes Relative: 2 %
Neutro Abs: 9.7 10*3/uL — ABNORMAL HIGH (ref 1.7–7.7)
Neutrophils Relative %: 90 %
Platelet Count: 380 10*3/uL (ref 150–400)
RBC: 4.17 MIL/uL (ref 3.87–5.11)
RDW: 18.1 % — ABNORMAL HIGH (ref 11.5–15.5)
WBC Count: 10.8 10*3/uL — ABNORMAL HIGH (ref 4.0–10.5)
nRBC: 0 % (ref 0.0–0.2)

## 2022-08-22 MED ORDER — DIPHENHYDRAMINE HCL 25 MG PO CAPS
50.0000 mg | ORAL_CAPSULE | Freq: Once | ORAL | Status: AC
  Administered 2022-08-22: 50 mg via ORAL
  Filled 2022-08-22: qty 2

## 2022-08-22 MED ORDER — TRASTUZUMAB-ANNS CHEMO 150 MG IV SOLR
6.0000 mg/kg | Freq: Once | INTRAVENOUS | Status: AC
  Administered 2022-08-22: 840 mg via INTRAVENOUS
  Filled 2022-08-22: qty 40

## 2022-08-22 MED ORDER — HEPARIN SOD (PORK) LOCK FLUSH 100 UNIT/ML IV SOLN
500.0000 [IU] | Freq: Once | INTRAVENOUS | Status: AC | PRN
  Administered 2022-08-22: 500 [IU]

## 2022-08-22 MED ORDER — SODIUM CHLORIDE 0.9 % IV SOLN
420.0000 mg | Freq: Once | INTRAVENOUS | Status: AC
  Administered 2022-08-22: 420 mg via INTRAVENOUS
  Filled 2022-08-22: qty 14

## 2022-08-22 MED ORDER — SODIUM CHLORIDE 0.9 % IV SOLN: Freq: Once | INTRAVENOUS | Status: AC

## 2022-08-22 MED ORDER — SODIUM CHLORIDE 0.9 % IV SOLN
10.0000 mg | Freq: Once | INTRAVENOUS | Status: AC
  Administered 2022-08-22: 10 mg via INTRAVENOUS
  Filled 2022-08-22: qty 10

## 2022-08-22 MED ORDER — SODIUM CHLORIDE 0.9% FLUSH
10.0000 mL | INTRAVENOUS | Status: DC | PRN
  Administered 2022-08-22: 10 mL

## 2022-08-22 MED ORDER — ACETAMINOPHEN 325 MG PO TABS
650.0000 mg | ORAL_TABLET | Freq: Once | ORAL | Status: AC
  Administered 2022-08-22: 650 mg via ORAL
  Filled 2022-08-22: qty 2

## 2022-08-22 MED ORDER — SODIUM CHLORIDE 0.9% FLUSH
10.0000 mL | Freq: Once | INTRAVENOUS | Status: AC
  Administered 2022-08-22: 10 mL

## 2022-08-22 MED ORDER — SODIUM CHLORIDE 0.9 % IV SOLN
60.0000 mg/m2 | Freq: Once | INTRAVENOUS | Status: AC
  Administered 2022-08-22: 160 mg via INTRAVENOUS
  Filled 2022-08-22: qty 16

## 2022-08-22 NOTE — Progress Notes (Signed)
Per Sherol Dade, PA-C, patient is ok to be treated with elevated heart rate.

## 2022-08-22 NOTE — Telephone Encounter (Signed)
Okay to refill? 

## 2022-08-22 NOTE — Progress Notes (Signed)
[place]    Patient Care Team: Claretta Fraise, MD as PCP - General (Family Medicine) Erroll Luna, MD as Consulting Physician (General Surgery) Benay Pike, MD as Consulting Physician (Hematology and Oncology) Eppie Gibson, MD as Attending Physician (Radiation Oncology) Mauro Kaufmann, RN as Oncology Nurse Navigator Rockwell Germany, RN as Oncology Nurse Navigator    Name / MRN / DOB: Diamond Marshall  [id number]  June 21, 1970   Date of visit: 08/22/2022   Chief Complaint/Reason for visit: toxicity check   Current Therapy: docetaxel Herceptin and pertuzumab   Last treatment:  Day 1   Cycle 2 on 08/02/22   ASSESSMENT & PLAN: Patient is a 52 y.o. female  with oncologic history of Malignant neoplasm of upper-inner quadrant of left breast in female, estrogen receptor positive followed by Dr. Chryl Heck.  I have viewed most recent oncology note and lab work.    #Malignant neoplasm of upper-inner quadrant of left breast in female, estrogen receptor positive - Labs are within parameters for treatment. HR 105 on arrival, rechecked during exam and Hr in the 90s. - Patient due for restaging imaging after 3rd cycle. MR has already been ordered by Dr. Chryl Heck.  - Next appointment with oncologist is 09/12/22  #Symptom management: Diarrhea - Docetaxel was reduced after first treatment 2/2 severity of diarrhea. Patient reports significant improvement since reduction. Still taking lomotil when needed. Refill sent in today per patient's request. - CMP without significant electrolyte derangement. Normal creatinine.   #Port-a-cath -On exam tube in neck appears to be kinked. Port was accessed without pain or difficulty prior to my exam. -Chest xray obtained to verify placement. Radiologist impressions:a right jugular Port-A-Cath remains in place and terminates over the mid SVC. -If issues with accessing port in the future we will need to reach out to Dr. Brantley Stage. Patient aware of  plan and will monitor for any concerning symptoms.   Heme/Onc History: Oncology History  Malignant neoplasm of upper-inner quadrant of left breast in female, estrogen receptor positive  06/06/2022 Mammogram   Diagnostic mammogram given palpable mass showed suspicious spiculated left breast mass with surrounding nodularity at 9 o'clock position.  This measures up to 3.1 cm sonographically however due to deep location and patient body habitus the mammographic measurement of 5.8 cm is felt to be more accurate.  At least 2 abnormal lymph nodes in the left axilla.  Possible abnormal palpable left supraclavicular lymph node.   06/06/2022 Breast US   Breast ultrasound confirmed these findings.   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  Prognostic showed ER 65% weak to strong staining, PR 15% weak to moderate staining, Ki-67 of 50% and HER2 positive by IHC at 3+    Genetic Testing   [facility] Custom Panel+RNA was Negative. Report date is 06/28/2022.   The Custom Hereditary Cancers Panel offered by [facility] includes sequencing and/or deletion duplication testing of the following 43 genes: APC, ATM, AXIN2, BAP1, BARD1, BMPR1A, BRCA1, BRCA2, BRIP1, CDH1, CDK4, CDKN2A (p14ARF and p16INK4a only), CHEK2, CTNNA1, EPCAM (Deletion/duplication testing only), FH, GREM1 (promoter region duplication testing only), HOXB13, KIT, MBD4, MEN1, MLH1, MSH2, MSH3, MSH6, MUTYH, NF1, NHTL1, PALB2, PDGFRA, PMS2, POLD1, POLE, PTEN, RAD51C, RAD51D, SMAD4, SMARCA4. STK11, TP53, TSC1, TSC2, and VHL.    06/29/2022 PET scan   IMPRESSION: 1. Hypermetabolic left breast mass,  compatible with primary breast malignancy. 2. Hypermetabolic left cervical, axillary, subpectoral, supraclavicular and prevascular mediastinal lymph nodes, compatible with nodal metastatic  disease. 3. No evidence of metastatic disease in the abdomen or pelvis. 4. Aortic Atherosclerosis (ICD10-I70.0).     Electronically Signed   By: Yetta Glassman M.D.   On: 06/29/2022 09:32   07/11/2022 -  Chemotherapy   Patient is on Treatment Plan : BREAST DOCEtaxel + Trastuzumab + Pertuzumab (THP) q21d x 8 cycles / Trastuzumab + Pertuzumab q21d x 4 cycles         Interval history-: MARITSSA SCATURRO is a 52 y.o. female with oncologic history as above presenting to [facility] today with chief complaint of toxicity check before d1c3. She is accompanied by her daughter who provides additional history.   Patient reports tolerating last treatment better compared to the first after the Taxotere dose reduction.  She continues to have diarrhea however reports only 4 episodes maximum per day typically.  Stool is a toothpaste consistency, no longer the liquid diarrhea she was having.  She takes Lomotil as needed.  She is requesting a refill for that today.  Patient does endorse feeling fatigued.  She is able to complete her ADLs without difficulty still.  She has only had 1 episodes of nausea and vomiting.  She has not had any fevers.  She did notice her port looks different in her neck. She denies any trauma besides someone giving her a hug and accidentally applying pressure to it. She does sleep on her right side she admits. She has not had any bleeding or abnormal bruising. Denies neuropathy.      ROS  All other systems are reviewed and are negative for acute change except as noted in the HPI.    Allergies  Allergen Reactions   Misc. Sulfonamide Containing Compounds Rash and Itching   Phentermine     Became suicidal on the medication   Bee Venom Swelling   Peanut Allergen Powder-Dnfp Other (See Comments)    Migraine   Topiramate Other (See Comments)    dizziness   Septra [Sulfamethoxazole-Trimethoprim] Rash   Sulfa Antibiotics Rash     Past Medical History:  Diagnosis Date   Abdominal  hernia    Anemia    b12 deficiency   Anxiety    Arthritis    B12 deficiency    Back pain    Cancer (HCC)    breast cancer   Depression    Family history of adverse reaction to anesthesia    mother vomits after anesthesia   Gallbladder problem    GERD (gastroesophageal reflux disease)    History of hiatal hernia    "small" per patient   Hypertension    Hypothyroidism    Joint pain    Knee pain    Palpitation    "when iron is low"   Pernicious anemia    SOB (shortness of breath)    Thyroid disease    hypothyroidism   Vitamin D deficiency      Past Surgical History:  Procedure Laterality Date   BREAST BIOPSY Left 06/09/2022   [place] LT BREAST BX W LOC DEV 1ST LESION IMG BX SPEC US GUIDE 06/09/2022 [facility]MO[facility]   CESAREAN SECTION  1999   Dilatation and currettagement     for miscarriage 18 years ago   PORTACATH PLACEMENT Right 07/05/2022   Procedure: INSERTION PORT-A-CATH;  Surgeon: Erroll Luna, MD;  Location: [facility];  Service: General;  Laterality: Right;  Social History   Socioeconomic History   Marital status: Married    Spouse name: Ronalee Belts   Number of children: Not on file   Years of education: Not on file   Highest education level: Not on file  Occupational History   Occupation: Adult Water engineer  Tobacco Use   Smoking status: Never   Smokeless tobacco: Never  Vaping Use   Vaping Use: Never used  Substance and Sexual Activity   Alcohol use: No   Drug use: No   Sexual activity: Yes    Birth control/protection: Pill  Other Topics Concern   Not on file  Social History Narrative   Not on file   Social Determinants of Health   Financial Resource Strain: Not on file  Food Insecurity: No Food Insecurity (06/21/2022)   Hunger Vital Sign    Worried About Running Out of Food in the Last Year: Never true    Ran Out of Food in the Last Year: Never true  Transportation Needs: No Transportation Needs (06/21/2022)   PRAPARE - Armed forces logistics/support/administrative officer (Medical): No    Lack of Transportation (Non-Medical): No  Physical Activity: Not on file  Stress: Not on file  Social Connections: Not on file  Intimate Partner Violence: Not on file    Family History  Problem Relation Age of Onset   Hypertension Mother    Diabetes Mother    High Cholesterol Mother    Thyroid disease Mother    Depression Mother    Anxiety disorder Mother    Obesity Mother    CVA Father        hemorrhagic   Ovarian cancer Maternal Aunt 45   Colon cancer Maternal Aunt    Breast cancer Cousin 38       maternal first cousin, reports negative genetic testing     Current Outpatient Medications:    cholestyramine (QUESTRAN) 4 g packet, Take 1 packet (4 g total) by mouth 3 (three) times daily with meals., Disp: 60 each, Rfl: 12   cyanocobalamin (,VITAMIN B-12,) 1000 MCG/ML injection, INJECT 1 ML INTRAMUSCULARLY EVERY 15 DAYS, Disp: 6 mL, Rfl: 1   dexamethasone (DECADRON) 4 MG tablet, Take 2 tabs by mouth 2 times daily starting day before chemo. Then take 2 tabs daily for 2 days starting day after chemo. Take with food. (Patient not taking: Reported on 07/04/2022), Disp: 30 tablet, Rfl: 1   diclofenac (VOLTAREN) 75 MG EC tablet, TAKE ONE TABLET TWICE DAILY AS NEEDED. needs appointment FOR refills (Patient not taking: Reported on 07/04/2022), Disp: 60 tablet, Rfl: 0   diphenoxylate-atropine (LOMOTIL) 2.5-0.025 MG tablet, TAKE TWO TABLETS EVERY 6 HOURS AS NEEDED FOR DIARRHEA, Disp: 30 tablet, Rfl: 0   EPINEPHrine 0.3 mg/0.3 mL IJ SOAJ injection, Inject 0.3 mg into the muscle as needed for anaphylaxis. (Patient not taking: Reported on 07/04/2022), Disp: 2 each, Rfl: 0   escitalopram (LEXAPRO) 20 MG tablet, Take 1 tablet (20 mg total) by mouth daily., Disp: 90 tablet, Rfl: 3   fluticasone (FLONASE) 50 MCG/ACT nasal spray, Place 1 spray into both nostrils daily. (Patient not taking: Reported on 07/04/2022), Disp: , Rfl:    lidocaine-prilocaine (EMLA) cream,  Apply to affected area once (Patient not taking: Reported on 07/04/2022), Disp: 30 g, Rfl: 3   lisinopril (ZESTRIL) 10 MG tablet, Take 10 mg by mouth daily., Disp: , Rfl:    LORazepam (ATIVAN) 0.5 MG tablet, Take 1 tablet (0.5 mg total) by mouth every 8 (  eight) hours as needed for anxiety., Disp: 30 tablet, Rfl: 1   methylPREDNISolone (MEDROL DOSEPAK) 4 MG TBPK tablet, Take 6 pills by mouth day 1, 5 on day 2, 4 on day 3, 3 on day 4, 2 on day 5, 1 on day 6, Disp: 21 tablet, Rfl: 0   ondansetron (ZOFRAN) 8 MG tablet, Take 1 tablet (8 mg total) by mouth every 8 (eight) hours as needed for nausea or vomiting. (Patient not taking: Reported on 07/04/2022), Disp: 30 tablet, Rfl: 1   oxyCODONE (OXY IR/ROXICODONE) 5 MG immediate release tablet, Take 1 tablet (5 mg total) by mouth every 6 (six) hours as needed for severe pain., Disp: 15 tablet, Rfl: 0   pantoprazole (PROTONIX) 40 MG tablet, Take 2 tablets (80 mg total) by mouth daily. (Patient taking differently: Take 40 mg by mouth daily.), Disp: 180 tablet, Rfl: 3   prochlorperazine (COMPAZINE) 10 MG tablet, Take 1 tablet (10 mg total) by mouth every 6 (six) hours as needed for nausea or vomiting. (Patient not taking: Reported on 07/04/2022), Disp: 30 tablet, Rfl: 1 No current facility-administered medications for this visit.  Facility-Administered Medications Ordered in Other Visits:    DOCEtaxel (TAXOTERE) 160 mg in sodium chloride 0.9 % 250 mL chemo infusion, 60 mg/m2 (Treatment Plan Recorded), Intravenous, Once, Iruku, Praveena, MD   heparin lock flush 100 unit/mL, 500 Units, Intracatheter, Once PRN, Iruku, Praveena, MD   sodium chloride flush (NS) 0.9 % injection 10 mL, 10 mL, Intracatheter, PRN, Iruku, Praveena, MD  PHYSICAL EXAM: ECOG FS:1 - Symptomatic but completely ambulatory    Vitals:   08/22/22 1228  BP: (!) 157/91  Pulse: (!) 105  Resp: 15  Temp: 99.7 F (37.6 C)  TempSrc: Oral  SpO2: 98%  Weight: (!) 329 lb 8 oz (149.5 kg)    Physical Exam Vitals and nursing note reviewed.  Constitutional:      Appearance: She is well-developed. She is not ill-appearing or toxic-appearing.  HENT:     Head: Normocephalic.     Nose: Nose normal.  Eyes:     Conjunctiva/sclera: Conjunctivae normal.  Neck:     Vascular: No JVD.  Cardiovascular:     Rate and Rhythm: Normal rate and regular rhythm.     Pulses: Normal pulses.     Heart sounds: Normal heart sounds.     Comments: HR in the 90s during exam Pulmonary:     Effort: Pulmonary effort is normal.     Breath sounds: Normal breath sounds.  Chest:     Comments: Port in right upper chest. Tubing is more pronounced in right lateral neck. No TTP Abdominal:     General: There is no distension.  Musculoskeletal:     Cervical back: Normal range of motion.  Skin:    General: Skin is warm and dry.  Neurological:     Mental Status: She is oriented to person, place, and time.       LABORATORY DATA: I have reviewed the data as listed    Latest Ref Rng & Units 08/22/2022   11:59 AM 08/02/2022   11:26 AM 07/24/2022   11:44 AM  CBC  WBC 4.0 - 10.5 K/uL 10.8  11.8  11.4   Hemoglobin 12.0 - 15.0 g/dL 10.5  12.1  11.3   Hematocrit 36.0 - 46.0 % 32.8  38.4  35.2   Platelets 150 - 400 K/uL 380  396  142         Latest Ref Rng & Units  08/22/2022   11:59 AM 08/02/2022   11:26 AM 07/24/2022   11:44 AM  CMP  Glucose 70 - 99 mg/dL 223  162  109   BUN 6 - 20 mg/dL 11  19  11    Creatinine 0.44 - 1.00 mg/dL 0.71  0.71  0.87   Sodium 135 - 145 mmol/L 139  139  142   Potassium 3.5 - 5.1 mmol/L 3.8  4.0  3.6   Chloride 98 - 111 mmol/L 106  103  110   CO2 22 - 32 mmol/L 26  27  25    Calcium 8.9 - 10.3 mg/dL 9.5  9.4  8.4   Total Protein 6.5 - 8.1 g/dL 6.9  7.1  6.2   Total Bilirubin 0.3 - 1.2 mg/dL 0.4  0.3  0.3   Alkaline Phos 38 - 126 U/L 112  108  118   AST 15 - 41 U/L 15  17  22    ALT 0 - 44 U/L 19  28  34        RADIOGRAPHIC STUDIES (from last 24 hours if  applicable) I have personally reviewed the radiological images as listed and agreed with the findings in the report. DG Chest 2 View  Result Date: 08/22/2022 CLINICAL DATA:  Port placement.  History of breast cancer. EXAM: CHEST - 2 VIEW COMPARISON:  Chest radiograph 07/05/2022 FINDINGS: A right jugular Port-A-Cath remains in place and terminates over the mid SVC. The cardiac silhouette remains mildly enlarged. Lung volumes are low with mild chronic interstitial prominence. No overt pulmonary edema, acute airspace consolidation, pleural effusion, or pneumothorax is identified. No acute osseous abnormality is seen. IMPRESSION: Right jugular Port-A-Cath as above. Electronically Signed   By: Logan Bores M.D.   On: 08/22/2022 14:10        Visit Diagnosis: 1. Port-A-Cath in place   2. Malignant neoplasm of upper-inner quadrant of left breast in female, estrogen receptor positive      Orders Placed This Encounter  Procedures   DG Chest 2 View    Standing Status:   Future    Number of Occurrences:   1    Standing Expiration Date:   08/22/2023    Order Specific Question:   Reason for Exam (SYMPTOM  OR DIAGNOSIS REQUIRED)    Answer:   check port placement. breast ca patient    Order Specific Question:   Is patient pregnant?    Answer:   No    Order Specific Question:   Preferred imaging location?    Answer:   [facility]    All questions were answered. The patient knows to call the clinic with any problems, questions or concerns. No barriers to learning was detected.  I have spent a total of 30 minutes minutes of face-to-face and non-face-to-face time, preparing to see the patient, obtaining and/or reviewing separately obtained history, performing a medically appropriate examination, counseling and educating the patient, ordering tests, documenting clinical information in the electronic health record, and care coordination (communications with other health care professionals or  caregivers).    Thank you for allowing me to participate in the care of this patient.    Barrie Folk, PA-C Department of Hematology/Oncology [facility] Phone: [phone]  [phone]    08/22/2022 4:03 PM

## 2022-08-22 NOTE — Patient Instructions (Signed)
[facility]  Discharge Instructions: Thank you for choosing [facility] to provide your oncology and hematology care.   If you have a lab appointment with the Cancer Center, please go directly to the Cancer Center and check in at the registration area.   Wear comfortable clothing and clothing appropriate for easy access to any Portacath or PICC line.   We strive to give you quality time with your provider. You may need to reschedule your appointment if you arrive late (15 or more minutes).  Arriving late affects you and other patients whose appointments are after yours.  Also, if you miss three or more appointments without notifying the office, you may be dismissed from the clinic at the provider's discretion.      For prescription refill requests, have your pharmacy contact our office and allow 72 hours for refills to be completed.    Today you received the following chemotherapy and/or immunotherapy agents: Kanjinti, Perjeta, Taxol      To help prevent nausea and vomiting after your treatment, we encourage you to take your nausea medication as directed.  BELOW ARE SYMPTOMS THAT SHOULD BE REPORTED IMMEDIATELY: *FEVER GREATER THAN 100.4 F (38 C) OR HIGHER *CHILLS OR SWEATING *NAUSEA AND VOMITING THAT IS NOT CONTROLLED WITH YOUR NAUSEA MEDICATION *UNUSUAL SHORTNESS OF BREATH *UNUSUAL BRUISING OR BLEEDING *URINARY PROBLEMS (pain or burning when urinating, or frequent urination) *BOWEL PROBLEMS (unusual diarrhea, constipation, pain near the anus) TENDERNESS IN MOUTH AND THROAT WITH OR WITHOUT PRESENCE OF ULCERS (sore throat, sores in mouth, or a toothache) UNUSUAL RASH, SWELLING OR PAIN  UNUSUAL VAGINAL DISCHARGE OR ITCHING   Items with * indicate a potential emergency and should be followed up as soon as possible or go to the Emergency Department if any problems should occur.  Please show the CHEMOTHERAPY ALERT CARD or IMMUNOTHERAPY  ALERT CARD at check-in to the Emergency Department and triage nurse.  Should you have questions after your visit or need to cancel or reschedule your appointment, please contact [facility]  Dept: [phone]  and follow the prompts.  Office hours are 8:00 a.m. to 4:30 p.m. Monday - Friday. Please note that voicemails left after 4:00 p.m. may not be returned until the following business day.  We are closed weekends and major holidays. You have access to a nurse at all times for urgent questions. Please call the main number to the clinic Dept: [phone] and follow the prompts.   For any non-urgent questions, you may also contact your provider using MyChart. We now offer e-Visits for anyone 18 and older to request care online for non-urgent symptoms. For details visit [email or web address].   Also download the MyChart app! Go to the app store, search "MyChart", open the app, select [facility], and log in with your MyChart username and password.  Pertuzumab Injection What is this medication? PERTUZUMAB (per TOOZ ue mab) treats breast cancer. It works by blocking a protein that causes cancer cells to grow and multiply. This helps to slow or stop the spread of cancer cells. It is a monoclonal antibody. This medicine may be used for other purposes; ask your health care provider or pharmacist if you have questions. COMMON BRAND NAME(S): PERJETA What should I tell my care team before I take this medication? They need to know if you have any of these conditions: Heart failure An unusual or allergic reaction to pertuzumab, other medications, foods, dyes, or   preservatives Pregnant or trying to get pregnant Breast-feeding How should I use this medication? This medication is injected into a vein. It is given by your care team in a hospital or clinic setting. Talk to your care team about the use of this medication in children. Special care may be  needed. Overdosage: If you think you have taken too much of this medicine contact a poison control center or emergency room at once. NOTE: This medicine is only for you. Do not share this medicine with others. What if I miss a dose? Keep appointments for follow-up doses. It is important not to miss your dose. Call your care team if you are unable to keep an appointment. What may interact with this medication? Interactions are not expected. This list may not describe all possible interactions. Give your health care provider a list of all the medicines, herbs, non-prescription drugs, or dietary supplements you use. Also tell them if you smoke, drink alcohol, or use illegal drugs. Some items may interact with your medicine. What should I watch for while using this medication? Your condition will be monitored carefully while you are receiving this medication. This medication may make you feel generally unwell. This is not uncommon as chemotherapy can affect healthy cells as well as cancer cells. Report any side effects. Continue your course of treatment even though you feel ill unless your care team tells you to stop. Talk to your care team if you may be pregnant. Serious birth defects can occur if you take this medication during pregnancy and for 7 months after the last dose. You will need a negative pregnancy test before starting this medication. Contraception is recommended while taking this medication and for 7 months after the last dose. Your care team can help you find the option that works for you. Do not breastfeed while taking this medication and for 7 months after the last dose. What side effects may I notice from receiving this medication? Side effects that you should report to your care team as soon as possible: Allergic reactions or angioedema--skin rash, itching or hives, swelling of the face, eyes, lips, tongue, arms, or legs, trouble swallowing or breathing Heart failure--shortness of  breath, swelling of the ankles, feet, or hands, sudden weight gain, unusual weakness or fatigue Infusion reactions--chest pain, shortness of breath or trouble breathing, feeling faint or lightheaded Side effects that usually do not require medical attention (report to your care team if they continue or are bothersome): Diarrhea Dry skin Fatigue Hair loss Nausea Vomiting This list may not describe all possible side effects. Call your doctor for medical advice about side effects. You may report side effects to FDA at 1-800-FDA-1088. Where should I keep my medication? This medication is given in a hospital or clinic. It will not be stored at home. NOTE: This sheet is a summary. It may not cover all possible information. If you have questions about this medicine, talk to your doctor, pharmacist, or health care provider.  2023 Elsevier/Gold Standard (2021-09-20 00:00:00)  Trastuzumab Injection What is this medication? TRASTUZUMAB (tras TOO zoo mab) treats breast cancer and stomach cancer. It works by blocking a protein that causes cancer cells to grow and multiply. This helps to slow or stop the spread of cancer cells. This medicine may be used for other purposes; ask your health care provider or pharmacist if you have questions. COMMON BRAND NAME(S): Herceptin, Herzuma, KANJINTI, Ogivri, Ontruzant, Trazimera What should I tell my care team before I take this medication? They   need to know if you have any of these conditions: Heart failure Lung disease An unusual or allergic reaction to trastuzumab, other medications, foods, dyes, or preservatives Pregnant or trying to get pregnant Breast-feeding How should I use this medication? This medication is injected into a vein. It is given by your care team in a hospital or clinic setting. Talk to your care team about the use of this medication in children. It is not approved for use in children. Overdosage: If you think you have taken too much of  this medicine contact a poison control center or emergency room at once. NOTE: This medicine is only for you. Do not share this medicine with others. What if I miss a dose? Keep appointments for follow-up doses. It is important not to miss your dose. Call your care team if you are unable to keep an appointment. What may interact with this medication? Certain types of chemotherapy, such as daunorubicin, doxorubicin, epirubicin, idarubicin This list may not describe all possible interactions. Give your health care provider a list of all the medicines, herbs, non-prescription drugs, or dietary supplements you use. Also tell them if you smoke, drink alcohol, or use illegal drugs. Some items may interact with your medicine. What should I watch for while using this medication? Your condition will be monitored carefully while you are receiving this medication. This medication may make you feel generally unwell. This is not uncommon, as chemotherapy affects healthy cells as well as cancer cells. Report any side effects. Continue your course of treatment even though you feel ill unless your care team tells you to stop. This medication may increase your risk of getting an infection. Call your care team for advice if you get a fever, chills, sore throat, or other symptoms of a cold or flu. Do not treat yourself. Try to avoid being around people who are sick. Avoid taking medications that contain aspirin, acetaminophen, ibuprofen, naproxen, or ketoprofen unless instructed by your care team. These medications can hide a fever. Talk to your care team if you may be pregnant. Serious birth defects can occur if you take this medication during pregnancy and for 7 months after the last dose. You will need a negative pregnancy test before starting this medication. Contraception is recommended while taking this medication and for 7 months after the last dose. Your care team can help you find the option that works for  you. Do not breastfeed while taking this medication and for 7 months after stopping treatment. What side effects may I notice from receiving this medication? Side effects that you should report to your care team as soon as possible: Allergic reactions or angioedema--skin rash, itching or hives, swelling of the face, eyes, lips, tongue, arms, or legs, trouble swallowing or breathing Dry cough, shortness of breath or trouble breathing Heart failure--shortness of breath, swelling of the ankles, feet, or hands, sudden weight gain, unusual weakness or fatigue Infection--fever, chills, cough, or sore throat Infusion reactions--chest pain, shortness of breath or trouble breathing, feeling faint or lightheaded Side effects that usually do not require medical attention (report to your care team if they continue or are bothersome): Diarrhea Dizziness Headache Nausea Trouble sleeping Vomiting This list may not describe all possible side effects. Call your doctor for medical advice about side effects. You may report side effects to FDA at 1-800-FDA-1088. Where should I keep my medication? This medication is given in a hospital or clinic. It will not be stored at home. NOTE: This sheet is a   summary. It may not cover all possible information. If you have questions about this medicine, talk to your doctor, pharmacist, or health care provider.  2023 Elsevier/Gold Standard (2021-09-08 00:00:00)  Paclitaxel Injection What is this medication? PACLITAXEL (PAK li TAX el) treats some types of cancer. It works by slowing down the growth of cancer cells. This medicine may be used for other purposes; ask your health care provider or pharmacist if you have questions. COMMON BRAND NAME(S): Onxol, Taxol What should I tell my care team before I take this medication? They need to know if you have any of these conditions: Heart disease Liver disease Low white blood cell levels An unusual or allergic reaction to  paclitaxel, other medications, foods, dyes, or preservatives If you or your partner are pregnant or trying to get pregnant Breast-feeding How should I use this medication? This medication is injected into a vein. It is given by your care team in a hospital or clinic setting. Talk to your care team about the use of this medication in children. While it may be given to children for selected conditions, precautions do apply. Overdosage: If you think you have taken too much of this medicine contact a poison control center or emergency room at once. NOTE: This medicine is only for you. Do not share this medicine with others. What if I miss a dose? Keep appointments for follow-up doses. It is important not to miss your dose. Call your care team if you are unable to keep an appointment. What may interact with this medication? Do not take this medication with any of the following: Live virus vaccines Other medications may affect the way this medication works. Talk with your care team about all of the medications you take. They may suggest changes to your treatment plan to lower the risk of side effects and to make sure your medications work as intended. This list may not describe all possible interactions. Give your health care provider a list of all the medicines, herbs, non-prescription drugs, or dietary supplements you use. Also tell them if you smoke, drink alcohol, or use illegal drugs. Some items may interact with your medicine. What should I watch for while using this medication? Your condition will be monitored carefully while you are receiving this medication. You may need blood work while taking this medication. This medication may make you feel generally unwell. This is not uncommon as chemotherapy can affect healthy cells as well as cancer cells. Report any side effects. Continue your course of treatment even though you feel ill unless your care team tells you to stop. This medication can cause  serious allergic reactions. To reduce the risk, your care team may give you other medications to take before receiving this one. Be sure to follow the directions from your care team. This medication may increase your risk of getting an infection. Call your care team for advice if you get a fever, chills, sore throat, or other symptoms of a cold or flu. Do not treat yourself. Try to avoid being around people who are sick. This medication may increase your risk to bruise or bleed. Call your care team if you notice any unusual bleeding. Be careful brushing or flossing your teeth or using a toothpick because you may get an infection or bleed more easily. If you have any dental work done, tell your dentist you are receiving this medication. Talk to your care team if you may be pregnant. Serious birth defects can occur if you take this   medication during pregnancy. Talk to your care team before breastfeeding. Changes to your treatment plan may be needed. What side effects may I notice from receiving this medication? Side effects that you should report to your care team as soon as possible: Allergic reactions--skin rash, itching, hives, swelling of the face, lips, tongue, or throat Heart rhythm changes--fast or irregular heartbeat, dizziness, feeling faint or lightheaded, chest pain, trouble breathing Increase in blood pressure Infection--fever, chills, cough, sore throat, wounds that don't heal, pain or trouble when passing urine, general feeling of discomfort or being unwell Low blood pressure--dizziness, feeling faint or lightheaded, blurry vision Low red blood cell level--unusual weakness or fatigue, dizziness, headache, trouble breathing Painful swelling, warmth, or redness of the skin, blisters or sores at the infusion site Pain, tingling, or numbness in the hands or feet Slow heartbeat--dizziness, feeling faint or lightheaded, confusion, trouble breathing, unusual weakness or fatigue Unusual bruising  or bleeding Side effects that usually do not require medical attention (report to your care team if they continue or are bothersome): Diarrhea Hair loss Joint pain Loss of appetite Muscle pain Nausea Vomiting This list may not describe all possible side effects. Call your doctor for medical advice about side effects. You may report side effects to FDA at 1-800-FDA-1088. Where should I keep my medication? This medication is given in a hospital or clinic. It will not be stored at home. NOTE: This sheet is a summary. It may not cover all possible information. If you have questions about this medicine, talk to your doctor, pharmacist, or health care provider.  2023 Elsevier/Gold Standard (2021-09-07 00:00:00)   

## 2022-08-24 ENCOUNTER — Other Ambulatory Visit: Payer: Self-pay | Admitting: *Deleted

## 2022-08-24 ENCOUNTER — Other Ambulatory Visit: Payer: Self-pay

## 2022-08-24 ENCOUNTER — Inpatient Hospital Stay: Payer: Commercial Managed Care - PPO

## 2022-08-24 VITALS — BP 144/93 | HR 88 | Temp 98.8°F | Resp 20

## 2022-08-24 DIAGNOSIS — Z5112 Encounter for antineoplastic immunotherapy: Secondary | ICD-10-CM | POA: Diagnosis not present

## 2022-08-24 DIAGNOSIS — Z170 Estrogen receptor positive status [ER+]: Secondary | ICD-10-CM

## 2022-08-24 MED ORDER — PEGFILGRASTIM-CBQV 6 MG/0.6ML ~~LOC~~ SOSY
6.0000 mg | PREFILLED_SYRINGE | Freq: Once | SUBCUTANEOUS | Status: AC
  Administered 2022-08-24: 6 mg via SUBCUTANEOUS
  Filled 2022-08-24: qty 0.6

## 2022-08-24 NOTE — Patient Instructions (Signed)

## 2022-08-25 ENCOUNTER — Telehealth: Payer: Self-pay | Admitting: *Deleted

## 2022-08-25 ENCOUNTER — Encounter: Payer: Self-pay | Admitting: Physician Assistant

## 2022-08-25 NOTE — Telephone Encounter (Signed)
This RN spoke with patient per her MyChart message to [facility]- with concern that she has noticed breast pain post chemotherapy and inquiry "is this normal".  Note pt is being treated neoadjuvant.  She states with each treatment she has noticed pain with some increase in discomfort with most recent 3rd cycle this past Tuesday.  She denies any redness,swelling or increased warmth.  Pain subsides post few days post treatment.  She has been using heat vs cold.  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  This RN also discussed better to use cool compresses vs heat for discomfort.

## 2022-08-28 ENCOUNTER — Encounter: Payer: Self-pay | Admitting: Physician Assistant

## 2022-08-28 ENCOUNTER — Telehealth: Payer: Self-pay | Admitting: *Deleted

## 2022-08-28 NOTE — Telephone Encounter (Signed)
This RN noted in pt sent message to MyChart earlier today - and no noted follow up.  This RN contacted pt who states she is doing better since messaging about possible need for IVFs - "just really have worked at pushing fluids so I am doing better now"  She states recent therapy "just really made me fatigued".  This RN discussed the above as well as possible need to come in tomorrow with pt stating presently she does not feel the need.  This RN also reviewed with pt best to either call or message to Dr Remonia Richter nurse for best outcome vs to [facility]- which may not be covered as well.  Alfred verbalized understanding and appreciation of call and discussion.  No further needs at this time.

## 2022-08-29 ENCOUNTER — Encounter: Payer: Self-pay | Admitting: Hematology and Oncology

## 2022-08-30 ENCOUNTER — Telehealth: Payer: Self-pay

## 2022-08-30 NOTE — Telephone Encounter (Signed)
Pt called and states she is experiencing nausea and diarrhea. She states she started feeling bad, and shaky, and has been resting. She denies a fever. She denies taking any of her antiemetics. Advised pt to take antiemetics as prescribed and the lomotil as prescribed and drink plenty of fluids. If she is not feeling any better she was advised to call us back for [facility] appt. She verbalized agreement and understanding.

## 2022-09-01 ENCOUNTER — Other Ambulatory Visit: Payer: Commercial Managed Care - PPO

## 2022-09-01 ENCOUNTER — Ambulatory Visit: Payer: Commercial Managed Care - PPO

## 2022-09-01 ENCOUNTER — Inpatient Hospital Stay (HOSPITAL_BASED_OUTPATIENT_CLINIC_OR_DEPARTMENT_OTHER): Payer: Commercial Managed Care - PPO | Admitting: Physician Assistant

## 2022-09-01 ENCOUNTER — Inpatient Hospital Stay: Payer: Commercial Managed Care - PPO

## 2022-09-01 ENCOUNTER — Other Ambulatory Visit: Payer: Self-pay

## 2022-09-01 VITALS — BP 122/70 | HR 87 | Temp 98.8°F | Resp 19

## 2022-09-01 DIAGNOSIS — Z95828 Presence of other vascular implants and grafts: Secondary | ICD-10-CM

## 2022-09-01 DIAGNOSIS — C50212 Malignant neoplasm of upper-inner quadrant of left female breast: Secondary | ICD-10-CM | POA: Diagnosis not present

## 2022-09-01 DIAGNOSIS — Z170 Estrogen receptor positive status [ER+]: Secondary | ICD-10-CM

## 2022-09-01 DIAGNOSIS — R197 Diarrhea, unspecified: Secondary | ICD-10-CM

## 2022-09-01 DIAGNOSIS — Z5112 Encounter for antineoplastic immunotherapy: Secondary | ICD-10-CM | POA: Diagnosis not present

## 2022-09-01 LAB — CBC WITH DIFFERENTIAL (CANCER CENTER ONLY)
Abs Immature Granulocytes: 2.28 10*3/uL — ABNORMAL HIGH (ref 0.00–0.07)
Basophils Absolute: 0.1 10*3/uL (ref 0.0–0.1)
Basophils Relative: 1 %
Eosinophils Absolute: 0.1 10*3/uL (ref 0.0–0.5)
Eosinophils Relative: 0 %
HCT: 31.3 % — ABNORMAL LOW (ref 36.0–46.0)
Hemoglobin: 9.9 g/dL — ABNORMAL LOW (ref 12.0–15.0)
Immature Granulocytes: 11 %
Lymphocytes Relative: 15 %
Lymphs Abs: 3 10*3/uL (ref 0.7–4.0)
MCH: 25.4 pg — ABNORMAL LOW (ref 26.0–34.0)
MCHC: 31.6 g/dL (ref 30.0–36.0)
MCV: 80.5 fL (ref 80.0–100.0)
Monocytes Absolute: 1 10*3/uL (ref 0.1–1.0)
Monocytes Relative: 5 %
Neutro Abs: 13.5 10*3/uL — ABNORMAL HIGH (ref 1.7–7.7)
Neutrophils Relative %: 68 %
Platelet Count: 226 10*3/uL (ref 150–400)
RBC: 3.89 MIL/uL (ref 3.87–5.11)
RDW: 19.2 % — ABNORMAL HIGH (ref 11.5–15.5)
Smear Review: NORMAL
WBC Count: 19.9 10*3/uL — ABNORMAL HIGH (ref 4.0–10.5)
nRBC: 0.7 % — ABNORMAL HIGH (ref 0.0–0.2)

## 2022-09-01 LAB — CMP (CANCER CENTER ONLY)
ALT: 26 U/L (ref 0–44)
AST: 22 U/L (ref 15–41)
Albumin: 3.5 g/dL (ref 3.5–5.0)
Alkaline Phosphatase: 136 U/L — ABNORMAL HIGH (ref 38–126)
Anion gap: 6 (ref 5–15)
BUN: 10 mg/dL (ref 6–20)
CO2: 31 mmol/L (ref 22–32)
Calcium: 9.1 mg/dL (ref 8.9–10.3)
Chloride: 104 mmol/L (ref 98–111)
Creatinine: 0.7 mg/dL (ref 0.44–1.00)
GFR, Estimated: >60 mL/min (ref >60)
Glucose, Bld: 89 mg/dL (ref 70–99)
Potassium: 3.8 mmol/L (ref 3.5–5.1)
Sodium: 141 mmol/L (ref 135–145)
Total Bilirubin: 0.3 mg/dL (ref 0.3–1.2)
Total Protein: 6.3 g/dL — ABNORMAL LOW (ref 6.5–8.1)

## 2022-09-01 LAB — MAGNESIUM: Magnesium: 1.9 mg/dL (ref 1.7–2.4)

## 2022-09-01 MED ORDER — HEPARIN SOD (PORK) LOCK FLUSH 100 UNIT/ML IV SOLN
500.0000 [IU] | Freq: Once | INTRAVENOUS | Status: AC
  Administered 2022-09-01: 500 [IU]

## 2022-09-01 MED ORDER — SODIUM CHLORIDE 0.9% FLUSH
10.0000 mL | Freq: Once | INTRAVENOUS | Status: AC
  Administered 2022-09-01: 10 mL

## 2022-09-01 MED ORDER — METHYLPREDNISOLONE 4 MG PO TBPK: ORAL_TABLET | ORAL | 0 refills | Status: DC

## 2022-09-01 MED ORDER — SODIUM CHLORIDE 0.9 % IV SOLN: Freq: Once | INTRAVENOUS | Status: AC

## 2022-09-01 MED ORDER — DIPHENOXYLATE-ATROPINE 2.5-0.025 MG PO TABS: ORAL_TABLET | ORAL | 1 refills | Status: DC

## 2022-09-01 NOTE — Patient Instructions (Signed)

## 2022-09-01 NOTE — Progress Notes (Signed)
Symptom Management Consult Note [facility]    Patient Care Team: Mechele Claude, MD as PCP - General (Family Medicine) Harriette Bouillon, MD as Consulting Physician (General Surgery) Rachel Moulds, MD as Consulting Physician (Hematology and Oncology) Lonie Peak, MD as Attending Physician (Radiation Oncology) Pershing Proud, RN as Oncology Nurse Navigator Donnelly Angelica, RN as Oncology Nurse Navigator    Name / MRN / DOB: Diamond Marshall  [id number]  25-Apr-1971   Date of visit: 09/01/2022   Chief Complaint/Reason for visit: diarrhea   Current Therapy: Janeann Merl with Greggory Keen  Last treatment:  Day 1   Cycle 3 on 08/22/22 with G-CSF on 08/24/22   ASSESSMENT & PLAN: Patient is a 52 y.o. female  with oncologic history of malignant neoplasm of upper-inner quadrant of left breast, estrogen receptor positive  followed by Dr. Al Pimple.  I have viewed most recent oncology note and lab work.    #Malignant neoplasm of upper-inner quadrant of left breast, estrogen receptor positive  - Next appointment with oncologist is 09/12/22   #Diarrhea - History of the same after first treatment, has acutely worsened in the last week. - Patient afebrile, non toxic appearing. Benign abdominal exam. - 1L NS given for hydration support. - CBC with leukocytosis 19.9, suspect this is related to G-CSF and less likely infection in the absence of fever. CMP overall unremarkable, no significant electrolyte derangement.  -Refill sent for Lomotil. Patient was not taking it correctly and was underdosing, discussed correct dose. - Discussed with patient diarrhea is likely adverse reaction of Perjeta (46-67%). It is reasonable to remove this from her treatment plan given the severity of her symptoms, grade 3. She does not want Perjeta removed until she can talk about this further with oncologist.  - Discussed at length the importance of staying well-hydrated. In an attempt to  decrease diarrhea will also prescribe Medrol Dosepak. -Dr. Al Pimple agreeable with plan.  Strict ED precautions discussed should symptoms worsen.   Heme/Onc History: Oncology History  Malignant neoplasm of upper-inner quadrant of left breast in female, estrogen receptor positive  06/06/2022 Mammogram   Diagnostic mammogram given palpable mass showed suspicious spiculated left breast mass with surrounding nodularity at 9 o'clock position.  This measures up to 3.1 cm sonographically however due to deep location and patient body habitus the mammographic measurement of 5.8 cm is felt to be more accurate.  At least 2 abnormal lymph nodes in the left axilla.  Possible abnormal palpable left supraclavicular lymph node.   06/06/2022 Breast US   Breast ultrasound confirmed these findings.   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  Prognostic showed ER 65% weak to strong staining, PR 15% weak to moderate staining, Ki-67 of 50% and HER2 positive by IHC at 3+    Genetic Testing   [facility] Custom Panel+RNA was Negative. Report date is 06/28/2022.   The Custom Hereditary Cancers Panel offered by [facility] includes sequencing and/or deletion duplication testing of the following 43 genes: APC, ATM, AXIN2, BAP1, BARD1, BMPR1A, BRCA1, BRCA2, BRIP1, CDH1, CDK4, CDKN2A (p14ARF and p16INK4a only), CHEK2, CTNNA1, EPCAM (Deletion/duplication testing only), FH, GREM1 (promoter region duplication testing only), HOXB13, KIT, MBD4, MEN1, MLH1, MSH2, MSH3, MSH6, MUTYH, NF1, NHTL1, PALB2, PDGFRA, PMS2, POLD1, POLE, PTEN, RAD51C, RAD51D, SMAD4, SMARCA4. STK11, TP53, TSC1, TSC2, and VHL.  06/29/2022 PET scan   IMPRESSION: 1. Hypermetabolic left breast mass, compatible with primary breast malignancy. 2. Hypermetabolic left cervical, axillary,  subpectoral, supraclavicular and prevascular mediastinal lymph nodes, compatible with nodal metastatic disease. 3. No evidence of metastatic disease in the abdomen or pelvis. 4. Aortic Atherosclerosis (ICD10-I70.0).     Electronically Signed   By: Allegra Lai M.D.   On: 06/29/2022 09:32   07/11/2022 -  Chemotherapy   Patient is on Treatment Plan : BREAST DOCEtaxel + Trastuzumab + Pertuzumab (THP) q21d x 8 cycles / Trastuzumab + Pertuzumab q21d x 4 cycles         Interval history-: Diamond Marshall is a 52 y.o. female with oncologic history as above presenting to [facility] today with chief complaint of diarrhea.  She is accompanied by her spouse who provides additional history.  Patient states her last treatment was 08/22/2022.  The first few days after treatment she had mild diarrhea.  Patient reports in the last week diarrhea has significantly worsened.  In the last 24 hours she has had 12 episodes of liquid stool.  She has been taking Lomotil which helps some she thinks.  She was only taking 1 pill instead of 2 as the directions state.  She does experience abdominal cramping after taking Lomotil which is an expected side effect.  She denies any other abdominal pain.  She has not had any fevers.  Patient is staying well-hydrated and drink 64 ounces of water yesterday.  She denies any recent antibiotic use or travel. The diarrhea is similar to when she experienced it after her first treatment. Denies fever or chills.      ROS  All other systems are reviewed and are negative for acute change except as noted in the HPI.    Allergies  Allergen Reactions   Misc. Sulfonamide Containing Compounds Rash and Itching   Phentermine     Became suicidal on the medication   Bee Venom Swelling   Peanut Allergen Powder-Dnfp Other (See Comments)    Migraine   Topiramate Other (See Comments)    dizziness   Septra [Sulfamethoxazole-Trimethoprim] Rash   Sulfa Antibiotics Rash     Past Medical  History:  Diagnosis Date   Abdominal hernia    Anemia    b12 deficiency   Anxiety    Arthritis    B12 deficiency    Back pain    Cancer (HCC)    breast cancer   Depression    Family history of adverse reaction to anesthesia    mother vomits after anesthesia   Gallbladder problem    GERD (gastroesophageal reflux disease)    History of hiatal hernia    "small" per patient   Hypertension    Hypothyroidism    Joint pain    Knee pain    Palpitation    "when iron is low"   Pernicious anemia    SOB (shortness of breath)    Thyroid disease    hypothyroidism   Vitamin D deficiency      Past Surgical History:  Procedure Laterality Date   BREAST BIOPSY Left 06/09/2022   [place] LT BREAST BX W LOC DEV 1ST LESION IMG BX SPEC US GUIDE 06/09/2022 [facility] MAMMO[facility]AP[facility]   CESAREAN SECTION  1999   Dilatation and currettagement     for miscarriage 18 years ago   PORTACATH PLACEMENT Right 07/05/2022   Procedure: INSERTION PORT-A-CATH;  Surgeon: Harriette Bouillon, MD;  Location: [facility] OR;  Service: General;  Laterality: Right;  Social History   Socioeconomic History   Marital status: Married    Spouse name: Kathlene November   Number of children: Not on file   Years of education: Not on file   Highest education level: Not on file  Occupational History   Occupation: [employer] copy  Tobacco Use   Smoking status: Never   Smokeless tobacco: Never  Vaping Use   Vaping Use: Never used  Substance and Sexual Activity   Alcohol use: No   Drug use: No   Sexual activity: Yes    Birth control/protection: Pill  Other Topics Concern   Not on file  Social History Narrative   Not on file   Social Determinants of Health   Financial Resource Strain: Not on file  Food Insecurity: No Food Insecurity (06/21/2022)   Hunger Vital Sign    Worried About Running Out of Food in the Last Year: Never true    Ran Out of Food in the Last Year: Never true  Transportation Needs: No Transportation Needs  (06/21/2022)   PRAPARE - Administrator, Civil Service (Medical): No    Lack of Transportation (Non-Medical): No  Physical Activity: Not on file  Stress: Not on file  Social Connections: Not on file  Intimate Partner Violence: Not on file    Family History  Problem Relation Age of Onset   Hypertension Mother    Diabetes Mother    High Cholesterol Mother    Thyroid disease Mother    Depression Mother    Anxiety disorder Mother    Obesity Mother    CVA Father        hemorrhagic   Ovarian cancer Maternal Aunt 79   Colon cancer Maternal Aunt    Breast cancer Cousin 21       maternal first cousin, reports negative genetic testing     Current Outpatient Medications:    methylPREDNISolone (MEDROL DOSEPAK) 4 MG TBPK tablet, Take 6 pills by mouth day 1, 5 on day 2, 4 on day 3, 3 on day 4, 2 on day 5, 1 on day 6, Disp: 21 tablet, Rfl: 0   cholestyramine (QUESTRAN) 4 g packet, Take 1 packet (4 g total) by mouth 3 (three) times daily with meals., Disp: 60 each, Rfl: 12   cyanocobalamin (,VITAMIN B-12,) 1000 MCG/ML injection, INJECT 1 ML INTRAMUSCULARLY EVERY 15 DAYS, Disp: 6 mL, Rfl: 1   dexamethasone (DECADRON) 4 MG tablet, Take 2 tabs by mouth 2 times daily starting day before chemo. Then take 2 tabs daily for 2 days starting day after chemo. Take with food. (Patient not taking: Reported on 07/04/2022), Disp: 30 tablet, Rfl: 1   diclofenac (VOLTAREN) 75 MG EC tablet, TAKE ONE TABLET TWICE DAILY AS NEEDED. needs appointment FOR refills (Patient not taking: Reported on 07/04/2022), Disp: 60 tablet, Rfl: 0   diphenoxylate-atropine (LOMOTIL) 2.5-0.025 MG tablet, TAKE TWO TABLETS EVERY 6 HOURS AS NEEDED FOR DIARRHEA, Disp: 30 tablet, Rfl: 1   EPINEPHrine 0.3 mg/0.3 mL IJ SOAJ injection, Inject 0.3 mg into the muscle as needed for anaphylaxis. (Patient not taking: Reported on 07/04/2022), Disp: 2 each, Rfl: 0   escitalopram (LEXAPRO) 20 MG tablet, Take 1 tablet (20 mg total) by mouth  daily., Disp: 90 tablet, Rfl: 3   fluticasone (FLONASE) 50 MCG/ACT nasal spray, Place 1 spray into both nostrils daily. (Patient not taking: Reported on 07/04/2022), Disp: , Rfl:    lidocaine-prilocaine (EMLA) cream, Apply to affected area once (Patient not taking: Reported  on 07/04/2022), Disp: 30 g, Rfl: 3   lisinopril (ZESTRIL) 10 MG tablet, Take 10 mg by mouth daily., Disp: , Rfl:    LORazepam (ATIVAN) 0.5 MG tablet, Take 1 tablet (0.5 mg total) by mouth every 8 (eight) hours as needed for anxiety., Disp: 30 tablet, Rfl: 1   ondansetron (ZOFRAN) 8 MG tablet, Take 1 tablet (8 mg total) by mouth every 8 (eight) hours as needed for nausea or vomiting. (Patient not taking: Reported on 07/04/2022), Disp: 30 tablet, Rfl: 1   oxyCODONE (OXY IR/ROXICODONE) 5 MG immediate release tablet, Take 1 tablet (5 mg total) by mouth every 6 (six) hours as needed for severe pain., Disp: 15 tablet, Rfl: 0   pantoprazole (PROTONIX) 40 MG tablet, Take 2 tablets (80 mg total) by mouth daily. (Patient taking differently: Take 40 mg by mouth daily.), Disp: 180 tablet, Rfl: 3   prochlorperazine (COMPAZINE) 10 MG tablet, Take 1 tablet (10 mg total) by mouth every 6 (six) hours as needed for nausea or vomiting. (Patient not taking: Reported on 07/04/2022), Disp: 30 tablet, Rfl: 1  PHYSICAL EXAM: ECOG FS:1 - Symptomatic but completely ambulatory    Vitals:   09/01/22 1348  BP: 122/70  Pulse: 87  Resp: 19  Temp: 98.8 F (37.1 C)  TempSrc: Oral  SpO2: 94%   Physical Exam Vitals and nursing note reviewed.  Constitutional:      Appearance: She is not ill-appearing or toxic-appearing.  HENT:     Head: Normocephalic.  Eyes:     Conjunctiva/sclera: Conjunctivae normal.  Cardiovascular:     Rate and Rhythm: Normal rate and regular rhythm.     Pulses: Normal pulses.     Heart sounds: Normal heart sounds.  Pulmonary:     Effort: Pulmonary effort is normal.     Breath sounds: Normal breath sounds.  Abdominal:      General: There is no distension.     Palpations: Abdomen is soft. There is no mass.     Tenderness: There is no abdominal tenderness. There is no guarding or rebound.  Musculoskeletal:     Cervical back: Normal range of motion.  Skin:    General: Skin is warm and dry.  Neurological:     Mental Status: She is alert.        LABORATORY DATA: I have reviewed the data as listed    Latest Ref Rng & Units 09/01/2022    1:31 PM 08/22/2022   11:59 AM 08/02/2022   11:26 AM  CBC  WBC 4.0 - 10.5 K/uL 19.9  10.8  11.8   Hemoglobin 12.0 - 15.0 g/dL 9.9  78.2  95.6   Hematocrit 36.0 - 46.0 % 31.3  32.8  38.4   Platelets 150 - 400 K/uL 226  380  396         Latest Ref Rng & Units 09/01/2022    1:31 PM 08/22/2022   11:59 AM 08/02/2022   11:26 AM  CMP  Glucose 70 - 99 mg/dL 89  213  086   BUN 6 - 20 mg/dL Creatinine 0.44 - 1.00 mg/dL 5.78  4.69  6.29   Sodium 135 - 145 mmol/L 141  139  139   Potassium 3.5 - 5.1 mmol/L 3.8  3.8  4.0   Chloride 98 - 111 mmol/L 104  106  103   CO2 22 - 32 mmol/L Calcium 8.9 - 10.3 mg/dL 9.1  9.5  9.4   Total Protein 6.5 - 8.1 g/dL 6.3  6.9  7.1   Total Bilirubin 0.3 - 1.2 mg/dL 0.3  0.4  0.3   Alkaline Phos 38 - 126 U/L 136  112  108   AST 15 - 41 U/L ALT 0 - 44 U/L RADIOGRAPHIC STUDIES (from last 24 hours if applicable) I have personally reviewed the radiological images as listed and agreed with the findings in the report. No results found.      Visit Diagnosis: 1. Malignant neoplasm of upper-inner quadrant of left breast in female, estrogen receptor positive   2. Diarrhea, unspecified type      No orders of the defined types were placed in this encounter.   All questions were answered. The patient knows to call the clinic with any problems, questions or concerns. No barriers to learning was detected.  I have spent a total of 30 minutes minutes of face-to-face and non-face-to-face  time, preparing to see the patient, obtaining and/or reviewing separately obtained history, performing a medically appropriate examination, counseling and educating the patient, ordering tests, documenting clinical information in the electronic health record, and care coordination (communications with other health care professionals or caregivers).    Thank you for allowing me to participate in the care of this patient.    Shanon Ace, PA-C Department of Hematology/Oncology [facility] Phone: [phone]  [phone]    09/01/2022 3:48 PM

## 2022-09-04 ENCOUNTER — Encounter: Payer: Self-pay | Admitting: Hematology and Oncology

## 2022-09-11 ENCOUNTER — Other Ambulatory Visit: Payer: Self-pay | Admitting: Hematology and Oncology

## 2022-09-11 ENCOUNTER — Encounter (HOSPITAL_COMMUNITY): Payer: Self-pay | Admitting: Radiology

## 2022-09-11 ENCOUNTER — Ambulatory Visit (HOSPITAL_COMMUNITY)
Admission: RE | Admit: 2022-09-11 | Discharge: 2022-09-11 | Disposition: A | Discharge status: Home / Self Care | Payer: Commercial Managed Care - PPO | Source: Ambulatory Visit | Attending: Hematology and Oncology | Admitting: Hematology and Oncology

## 2022-09-11 DIAGNOSIS — C50212 Malignant neoplasm of upper-inner quadrant of left female breast: Secondary | ICD-10-CM

## 2022-09-11 DIAGNOSIS — Z170 Estrogen receptor positive status [ER+]: Secondary | ICD-10-CM | POA: Diagnosis present

## 2022-09-11 MED ORDER — IOHEXOL 300 MG/ML  SOLN
100.0000 mL | Freq: Once | INTRAMUSCULAR | Status: AC | PRN
  Administered 2022-09-11: 100 mL via INTRAVENOUS

## 2022-09-11 MED ORDER — HEPARIN SOD (PORK) LOCK FLUSH 100 UNIT/ML IV SOLN: 500.0000 [IU] | Freq: Once | INTRAVENOUS | Status: DC

## 2022-09-11 MED ORDER — SODIUM CHLORIDE (PF) 0.9 % IJ SOLN
INTRAMUSCULAR | Status: AC
  Filled 2022-09-11: qty 50

## 2022-09-11 MED ORDER — HEPARIN SOD (PORK) LOCK FLUSH 100 UNIT/ML IV SOLN
INTRAVENOUS | Status: AC
  Filled 2022-09-11: qty 5

## 2022-09-11 MED FILL — Dexamethasone Sodium Phosphate Inj 100 MG/10ML: INTRAMUSCULAR | Qty: 1 | Status: AC

## 2022-09-12 ENCOUNTER — Other Ambulatory Visit: Payer: Self-pay

## 2022-09-12 ENCOUNTER — Inpatient Hospital Stay (HOSPITAL_BASED_OUTPATIENT_CLINIC_OR_DEPARTMENT_OTHER): Payer: Commercial Managed Care - PPO | Admitting: Hematology and Oncology

## 2022-09-12 ENCOUNTER — Inpatient Hospital Stay: Payer: Commercial Managed Care - PPO

## 2022-09-12 ENCOUNTER — Encounter: Payer: Self-pay | Admitting: Hematology and Oncology

## 2022-09-12 VITALS — HR 99

## 2022-09-12 DIAGNOSIS — Z170 Estrogen receptor positive status [ER+]: Secondary | ICD-10-CM | POA: Diagnosis not present

## 2022-09-12 DIAGNOSIS — Z95828 Presence of other vascular implants and grafts: Secondary | ICD-10-CM

## 2022-09-12 DIAGNOSIS — C50212 Malignant neoplasm of upper-inner quadrant of left female breast: Secondary | ICD-10-CM

## 2022-09-12 DIAGNOSIS — Z5112 Encounter for antineoplastic immunotherapy: Secondary | ICD-10-CM | POA: Diagnosis not present

## 2022-09-12 LAB — CMP (CANCER CENTER ONLY)
ALT: 24 U/L (ref 0–44)
AST: 16 U/L (ref 15–41)
Albumin: 3.9 g/dL (ref 3.5–5.0)
Alkaline Phosphatase: 109 U/L (ref 38–126)
Anion gap: 8 (ref 5–15)
BUN: 11 mg/dL (ref 6–20)
CO2: 27 mmol/L (ref 22–32)
Calcium: 9.4 mg/dL (ref 8.9–10.3)
Chloride: 104 mmol/L (ref 98–111)
Creatinine: 0.71 mg/dL (ref 0.44–1.00)
GFR, Estimated: >60 mL/min (ref >60)
Glucose, Bld: 156 mg/dL — ABNORMAL HIGH (ref 70–99)
Potassium: 3.9 mmol/L (ref 3.5–5.1)
Sodium: 139 mmol/L (ref 135–145)
Total Bilirubin: 0.4 mg/dL (ref 0.3–1.2)
Total Protein: 7.1 g/dL (ref 6.5–8.1)

## 2022-09-12 LAB — CBC WITH DIFFERENTIAL (CANCER CENTER ONLY)
Abs Immature Granulocytes: 0.09 10*3/uL — ABNORMAL HIGH (ref 0.00–0.07)
Basophils Absolute: 0 10*3/uL (ref 0.0–0.1)
Basophils Relative: 0 %
Eosinophils Absolute: 0 10*3/uL (ref 0.0–0.5)
Eosinophils Relative: 0 %
HCT: 34.1 % — ABNORMAL LOW (ref 36.0–46.0)
Hemoglobin: 10.8 g/dL — ABNORMAL LOW (ref 12.0–15.0)
Immature Granulocytes: 1 %
Lymphocytes Relative: 7 %
Lymphs Abs: 0.6 10*3/uL — ABNORMAL LOW (ref 0.7–4.0)
MCH: 25.5 pg — ABNORMAL LOW (ref 26.0–34.0)
MCHC: 31.7 g/dL (ref 30.0–36.0)
MCV: 80.6 fL (ref 80.0–100.0)
Monocytes Absolute: 0.1 10*3/uL (ref 0.1–1.0)
Monocytes Relative: 1 %
Neutro Abs: 7.5 10*3/uL (ref 1.7–7.7)
Neutrophils Relative %: 91 %
Platelet Count: 381 10*3/uL (ref 150–400)
RBC: 4.23 MIL/uL (ref 3.87–5.11)
RDW: 19.2 % — ABNORMAL HIGH (ref 11.5–15.5)
WBC Count: 8.3 10*3/uL (ref 4.0–10.5)
nRBC: 0 % (ref 0.0–0.2)

## 2022-09-12 MED ORDER — SODIUM CHLORIDE 0.9 % IV SOLN
420.0000 mg | Freq: Once | INTRAVENOUS | Status: AC
  Administered 2022-09-12: 420 mg via INTRAVENOUS
  Filled 2022-09-12: qty 14

## 2022-09-12 MED ORDER — METHYLPREDNISOLONE 4 MG PO TBPK: ORAL_TABLET | ORAL | 0 refills | Status: DC

## 2022-09-12 MED ORDER — ACETAMINOPHEN 325 MG PO TABS
650.0000 mg | ORAL_TABLET | Freq: Once | ORAL | Status: AC
  Administered 2022-09-12: 650 mg via ORAL
  Filled 2022-09-12: qty 2

## 2022-09-12 MED ORDER — SODIUM CHLORIDE 0.9 % IV SOLN: Freq: Once | INTRAVENOUS | Status: AC

## 2022-09-12 MED ORDER — SODIUM CHLORIDE 0.9 % IV SOLN
60.0000 mg/m2 | Freq: Once | INTRAVENOUS | Status: AC
  Administered 2022-09-12: 160 mg via INTRAVENOUS
  Filled 2022-09-12: qty 16

## 2022-09-12 MED ORDER — SODIUM CHLORIDE 0.9% FLUSH
10.0000 mL | Freq: Once | INTRAVENOUS | Status: AC
  Administered 2022-09-12: 10 mL

## 2022-09-12 MED ORDER — DIPHENHYDRAMINE HCL 25 MG PO CAPS
50.0000 mg | ORAL_CAPSULE | Freq: Once | ORAL | Status: AC
  Administered 2022-09-12: 50 mg via ORAL
  Filled 2022-09-12: qty 2

## 2022-09-12 MED ORDER — TRASTUZUMAB-ANNS CHEMO 420 MG IV SOLR
6.0000 mg/kg | Freq: Once | INTRAVENOUS | Status: AC
  Administered 2022-09-12: 840 mg via INTRAVENOUS
  Filled 2022-09-12: qty 40

## 2022-09-12 MED ORDER — SODIUM CHLORIDE 0.9 % IV SOLN
10.0000 mg | Freq: Once | INTRAVENOUS | Status: AC
  Administered 2022-09-12: 10 mg via INTRAVENOUS
  Filled 2022-09-12: qty 10

## 2022-09-12 NOTE — Patient Instructions (Signed)
[facility]  Discharge Instructions: Thank you for choosing [facility] to provide your oncology and hematology care.   If you have a lab appointment with the Cancer Center, please go directly to the Cancer Center and check in at the registration area.   Wear comfortable clothing and clothing appropriate for easy access to any Portacath or PICC line.   We strive to give you quality time with your provider. You may need to reschedule your appointment if you arrive late (15 or more minutes).  Arriving late affects you and other patients whose appointments are after yours.  Also, if you miss three or more appointments without notifying the office, you may be dismissed from the clinic at the provider's discretion.      For prescription refill requests, have your pharmacy contact our office and allow 72 hours for refills to be completed.    Today you received the following chemotherapy and/or immunotherapy agents: Kanjinti, Perjeta, Taxol      To help prevent nausea and vomiting after your treatment, we encourage you to take your nausea medication as directed.  BELOW ARE SYMPTOMS THAT SHOULD BE REPORTED IMMEDIATELY: *FEVER GREATER THAN 100.4 F (38 C) OR HIGHER *CHILLS OR SWEATING *NAUSEA AND VOMITING THAT IS NOT CONTROLLED WITH YOUR NAUSEA MEDICATION *UNUSUAL SHORTNESS OF BREATH *UNUSUAL BRUISING OR BLEEDING *URINARY PROBLEMS (pain or burning when urinating, or frequent urination) *BOWEL PROBLEMS (unusual diarrhea, constipation, pain near the anus) TENDERNESS IN MOUTH AND THROAT WITH OR WITHOUT PRESENCE OF ULCERS (sore throat, sores in mouth, or a toothache) UNUSUAL RASH, SWELLING OR PAIN  UNUSUAL VAGINAL DISCHARGE OR ITCHING   Items with * indicate a potential emergency and should be followed up as soon as possible or go to the Emergency Department if any problems should occur.  Please show the CHEMOTHERAPY ALERT CARD or IMMUNOTHERAPY  ALERT CARD at check-in to the Emergency Department and triage nurse.  Should you have questions after your visit or need to cancel or reschedule your appointment, please contact [facility]  Dept: [phone]  and follow the prompts.  Office hours are 8:00 a.m. to 4:30 p.m. Monday - Friday. Please note that voicemails left after 4:00 p.m. may not be returned until the following business day.  We are closed weekends and major holidays. You have access to a nurse at all times for urgent questions. Please call the main number to the clinic Dept: [phone] and follow the prompts.   For any non-urgent questions, you may also contact your provider using MyChart. We now offer e-Visits for anyone 18 and older to request care online for non-urgent symptoms. For details visit [email or web address].   Also download the MyChart app! Go to the app store, search "MyChart", open the app, select [facility], and log in with your MyChart username and password.  Pertuzumab Injection What is this medication? PERTUZUMAB (per TOOZ ue mab) treats breast cancer. It works by blocking a protein that causes cancer cells to grow and multiply. This helps to slow or stop the spread of cancer cells. It is a monoclonal antibody. This medicine may be used for other purposes; ask your health care provider or pharmacist if you have questions. COMMON BRAND NAME(S): PERJETA What should I tell my care team before I take this medication? They need to know if you have any of these conditions: Heart failure An unusual or allergic reaction to pertuzumab, other medications, foods, dyes, or   preservatives Pregnant or trying to get pregnant Breast-feeding How should I use this medication? This medication is injected into a vein. It is given by your care team in a hospital or clinic setting. Talk to your care team about the use of this medication in children. Special care may be  needed. Overdosage: If you think you have taken too much of this medicine contact a poison control center or emergency room at once. NOTE: This medicine is only for you. Do not share this medicine with others. What if I miss a dose? Keep appointments for follow-up doses. It is important not to miss your dose. Call your care team if you are unable to keep an appointment. What may interact with this medication? Interactions are not expected. This list may not describe all possible interactions. Give your health care provider a list of all the medicines, herbs, non-prescription drugs, or dietary supplements you use. Also tell them if you smoke, drink alcohol, or use illegal drugs. Some items may interact with your medicine. What should I watch for while using this medication? Your condition will be monitored carefully while you are receiving this medication. This medication may make you feel generally unwell. This is not uncommon as chemotherapy can affect healthy cells as well as cancer cells. Report any side effects. Continue your course of treatment even though you feel ill unless your care team tells you to stop. Talk to your care team if you may be pregnant. Serious birth defects can occur if you take this medication during pregnancy and for 7 months after the last dose. You will need a negative pregnancy test before starting this medication. Contraception is recommended while taking this medication and for 7 months after the last dose. Your care team can help you find the option that works for you. Do not breastfeed while taking this medication and for 7 months after the last dose. What side effects may I notice from receiving this medication? Side effects that you should report to your care team as soon as possible: Allergic reactions or angioedema--skin rash, itching or hives, swelling of the face, eyes, lips, tongue, arms, or legs, trouble swallowing or breathing Heart failure--shortness of  breath, swelling of the ankles, feet, or hands, sudden weight gain, unusual weakness or fatigue Infusion reactions--chest pain, shortness of breath or trouble breathing, feeling faint or lightheaded Side effects that usually do not require medical attention (report to your care team if they continue or are bothersome): Diarrhea Dry skin Fatigue Hair loss Nausea Vomiting This list may not describe all possible side effects. Call your doctor for medical advice about side effects. You may report side effects to FDA at 1-800-FDA-1088. Where should I keep my medication? This medication is given in a hospital or clinic. It will not be stored at home. NOTE: This sheet is a summary. It may not cover all possible information. If you have questions about this medicine, talk to your doctor, pharmacist, or health care provider.  2023 Elsevier/Gold Standard (2021-09-20 00:00:00)  Trastuzumab Injection What is this medication? TRASTUZUMAB (tras TOO zoo mab) treats breast cancer and stomach cancer. It works by blocking a protein that causes cancer cells to grow and multiply. This helps to slow or stop the spread of cancer cells. This medicine may be used for other purposes; ask your health care provider or pharmacist if you have questions. COMMON BRAND NAME(S): Herceptin, Herzuma, KANJINTI, Ogivri, Ontruzant, Trazimera What should I tell my care team before I take this medication? They   need to know if you have any of these conditions: Heart failure Lung disease An unusual or allergic reaction to trastuzumab, other medications, foods, dyes, or preservatives Pregnant or trying to get pregnant Breast-feeding How should I use this medication? This medication is injected into a vein. It is given by your care team in a hospital or clinic setting. Talk to your care team about the use of this medication in children. It is not approved for use in children. Overdosage: If you think you have taken too much of  this medicine contact a poison control center or emergency room at once. NOTE: This medicine is only for you. Do not share this medicine with others. What if I miss a dose? Keep appointments for follow-up doses. It is important not to miss your dose. Call your care team if you are unable to keep an appointment. What may interact with this medication? Certain types of chemotherapy, such as daunorubicin, doxorubicin, epirubicin, idarubicin This list may not describe all possible interactions. Give your health care provider a list of all the medicines, herbs, non-prescription drugs, or dietary supplements you use. Also tell them if you smoke, drink alcohol, or use illegal drugs. Some items may interact with your medicine. What should I watch for while using this medication? Your condition will be monitored carefully while you are receiving this medication. This medication may make you feel generally unwell. This is not uncommon, as chemotherapy affects healthy cells as well as cancer cells. Report any side effects. Continue your course of treatment even though you feel ill unless your care team tells you to stop. This medication may increase your risk of getting an infection. Call your care team for advice if you get a fever, chills, sore throat, or other symptoms of a cold or flu. Do not treat yourself. Try to avoid being around people who are sick. Avoid taking medications that contain aspirin, acetaminophen, ibuprofen, naproxen, or ketoprofen unless instructed by your care team. These medications can hide a fever. Talk to your care team if you may be pregnant. Serious birth defects can occur if you take this medication during pregnancy and for 7 months after the last dose. You will need a negative pregnancy test before starting this medication. Contraception is recommended while taking this medication and for 7 months after the last dose. Your care team can help you find the option that works for  you. Do not breastfeed while taking this medication and for 7 months after stopping treatment. What side effects may I notice from receiving this medication? Side effects that you should report to your care team as soon as possible: Allergic reactions or angioedema--skin rash, itching or hives, swelling of the face, eyes, lips, tongue, arms, or legs, trouble swallowing or breathing Dry cough, shortness of breath or trouble breathing Heart failure--shortness of breath, swelling of the ankles, feet, or hands, sudden weight gain, unusual weakness or fatigue Infection--fever, chills, cough, or sore throat Infusion reactions--chest pain, shortness of breath or trouble breathing, feeling faint or lightheaded Side effects that usually do not require medical attention (report to your care team if they continue or are bothersome): Diarrhea Dizziness Headache Nausea Trouble sleeping Vomiting This list may not describe all possible side effects. Call your doctor for medical advice about side effects. You may report side effects to FDA at 1-800-FDA-1088. Where should I keep my medication? This medication is given in a hospital or clinic. It will not be stored at home. NOTE: This sheet is a   summary. It may not cover all possible information. If you have questions about this medicine, talk to your doctor, pharmacist, or health care provider.  2023 Elsevier/Gold Standard (2021-09-08 00:00:00)  Paclitaxel Injection What is this medication? PACLITAXEL (PAK li TAX el) treats some types of cancer. It works by slowing down the growth of cancer cells. This medicine may be used for other purposes; ask your health care provider or pharmacist if you have questions. COMMON BRAND NAME(S): Onxol, Taxol What should I tell my care team before I take this medication? They need to know if you have any of these conditions: Heart disease Liver disease Low white blood cell levels An unusual or allergic reaction to  paclitaxel, other medications, foods, dyes, or preservatives If you or your partner are pregnant or trying to get pregnant Breast-feeding How should I use this medication? This medication is injected into a vein. It is given by your care team in a hospital or clinic setting. Talk to your care team about the use of this medication in children. While it may be given to children for selected conditions, precautions do apply. Overdosage: If you think you have taken too much of this medicine contact a poison control center or emergency room at once. NOTE: This medicine is only for you. Do not share this medicine with others. What if I miss a dose? Keep appointments for follow-up doses. It is important not to miss your dose. Call your care team if you are unable to keep an appointment. What may interact with this medication? Do not take this medication with any of the following: Live virus vaccines Other medications may affect the way this medication works. Talk with your care team about all of the medications you take. They may suggest changes to your treatment plan to lower the risk of side effects and to make sure your medications work as intended. This list may not describe all possible interactions. Give your health care provider a list of all the medicines, herbs, non-prescription drugs, or dietary supplements you use. Also tell them if you smoke, drink alcohol, or use illegal drugs. Some items may interact with your medicine. What should I watch for while using this medication? Your condition will be monitored carefully while you are receiving this medication. You may need blood work while taking this medication. This medication may make you feel generally unwell. This is not uncommon as chemotherapy can affect healthy cells as well as cancer cells. Report any side effects. Continue your course of treatment even though you feel ill unless your care team tells you to stop. This medication can cause  serious allergic reactions. To reduce the risk, your care team may give you other medications to take before receiving this one. Be sure to follow the directions from your care team. This medication may increase your risk of getting an infection. Call your care team for advice if you get a fever, chills, sore throat, or other symptoms of a cold or flu. Do not treat yourself. Try to avoid being around people who are sick. This medication may increase your risk to bruise or bleed. Call your care team if you notice any unusual bleeding. Be careful brushing or flossing your teeth or using a toothpick because you may get an infection or bleed more easily. If you have any dental work done, tell your dentist you are receiving this medication. Talk to your care team if you may be pregnant. Serious birth defects can occur if you take this   medication during pregnancy. Talk to your care team before breastfeeding. Changes to your treatment plan may be needed. What side effects may I notice from receiving this medication? Side effects that you should report to your care team as soon as possible: Allergic reactions--skin rash, itching, hives, swelling of the face, lips, tongue, or throat Heart rhythm changes--fast or irregular heartbeat, dizziness, feeling faint or lightheaded, chest pain, trouble breathing Increase in blood pressure Infection--fever, chills, cough, sore throat, wounds that don't heal, pain or trouble when passing urine, general feeling of discomfort or being unwell Low blood pressure--dizziness, feeling faint or lightheaded, blurry vision Low red blood cell level--unusual weakness or fatigue, dizziness, headache, trouble breathing Painful swelling, warmth, or redness of the skin, blisters or sores at the infusion site Pain, tingling, or numbness in the hands or feet Slow heartbeat--dizziness, feeling faint or lightheaded, confusion, trouble breathing, unusual weakness or fatigue Unusual bruising  or bleeding Side effects that usually do not require medical attention (report to your care team if they continue or are bothersome): Diarrhea Hair loss Joint pain Loss of appetite Muscle pain Nausea Vomiting This list may not describe all possible side effects. Call your doctor for medical advice about side effects. You may report side effects to FDA at 1-800-FDA-1088. Where should I keep my medication? This medication is given in a hospital or clinic. It will not be stored at home. NOTE: This sheet is a summary. It may not cover all possible information. If you have questions about this medicine, talk to your doctor, pharmacist, or health care provider.  2023 Elsevier/Gold Standard (2021-09-07 00:00:00)   

## 2022-09-12 NOTE — Assessment & Plan Note (Signed)
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 She felt this breast lump back in August 2023 however thought that this is likely benign and hence did not pursue a mammogram immediately.  She had her mammogram recently in January when she noticed some skin changes in that area.  She also had some CT neck because of swelling in the left neck which was thought to be reactive lymphadenopathy although she was told that this fatty lipoma.  She also reports some dry cough but attributed this to lisinopril.  Physical examination today noticed large left breast and a quadrant mass measuring almost 6 cm in largest dimension, lateral margin not palpable, palpable regional adenopathy and palpable nonregional left lower cervical/supraclavicular adenopathy.   PET unfortunately showed mediastinal adenopathy and non regional cervical adenopathy consistent with met disease.  She started first cycle of docetaxel/Herceptin and pertuzumab with palliative intent.  She had a really tough time after the first cycle with diarrhea, required multiple visits, intravenous fluids.  She is here before her planned cycle4-day 1.  Given poor tolerance to full dose chemo, have reduced the docetaxel dose to 60 mg/m. She is reluctant to omit pertuzumab. She would rather do medrol dosepak with each chemo cycle. Review of imaging per my read with remarkable response, awaiting official read We will aim for 6 cycles of THP followed by maintenance with herceptin/Perjeta Grade 2 diarrhea, well controlled with medrol dose pak and imodium.  We will also plan to repeat imaging after about 6-7 cycles of chemotherapy. Rachel Moulds MD

## 2022-09-12 NOTE — Progress Notes (Signed)
[facility] Cancer Follow up:    Mechele Claude, MD [address] [place] [address]   DIAGNOSIS:  Cancer Staging  Malignant neoplasm of upper-inner quadrant of left breast in female, estrogen receptor positive Staging form: Breast, AJCC 8th Edition - Clinical stage from 06/21/2022: Stage IIIB (cT3, cN3, cM0, G3, ER+, PR+, HER2+) - Unsigned Stage prefix: Initial diagnosis Histologic grading system: 3 grade system Stage used in treatment planning: Yes National guidelines used in treatment planning: Yes Type of national guideline used in treatment planning: NCCN   SUMMARY OF ONCOLOGIC HISTORY: Oncology History  Malignant neoplasm of upper-inner quadrant of left breast in female, estrogen receptor positive  06/06/2022 Mammogram   Diagnostic mammogram given palpable mass showed suspicious spiculated left breast mass with surrounding nodularity at 9 o'clock position.  This measures up to 3.1 cm sonographically however due to deep location and patient body habitus the mammographic measurement of 5.8 cm is felt to be more accurate.  At least 2 abnormal lymph nodes in the left axilla.  Possible abnormal palpable left supraclavicular lymph node.   06/06/2022 Breast US   Breast ultrasound confirmed these findings.   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  Prognostic showed ER 65% weak to strong staining, PR 15% weak to moderate staining, Ki-67 of 50% and HER2 positive by IHC at 3+    Genetic Testing   [facility] Custom Panel+RNA was Negative. Report date is 06/28/2022.   The Custom Hereditary Cancers Panel offered by [facility] includes sequencing and/or deletion duplication testing of the following 43 genes: APC, ATM, AXIN2, BAP1, BARD1, BMPR1A, BRCA1, BRCA2, BRIP1, CDH1, CDK4, CDKN2A (p14ARF and p16INK4a only),  CHEK2, CTNNA1, EPCAM (Deletion/duplication testing only), FH, GREM1 (promoter region duplication testing only), HOXB13, KIT, MBD4, MEN1, MLH1, MSH2, MSH3, MSH6, MUTYH, NF1, NHTL1, PALB2, PDGFRA, PMS2, POLD1, POLE, PTEN, RAD51C, RAD51D, SMAD4, SMARCA4. STK11, TP53, TSC1, TSC2, and VHL.    06/29/2022 PET scan   IMPRESSION: 1. Hypermetabolic left breast mass, compatible with primary breast malignancy. 2. Hypermetabolic left cervical, axillary, subpectoral, supraclavicular and prevascular mediastinal lymph nodes, compatible with nodal metastatic disease. 3. No evidence of metastatic disease in the abdomen or pelvis. 4. Aortic Atherosclerosis (ICD10-I70.0).     Electronically Signed   By: Allegra Lai M.D.   On: 06/29/2022 09:32   07/11/2022 -  Chemotherapy   Patient is on Treatment Plan : BREAST DOCEtaxel + Trastuzumab + Pertuzumab (THP) q21d x 8 cycles / Trastuzumab + Pertuzumab q21d x 4 cycles       CURRENT THERAPY: THP cycle 2 day 1  INTERVAL HISTORY:  ZULEICA SEITH 52 y.o. female returns for f/u.   After her first cycle of chemotherapy, she had a really tough time with diarrhea, almost grade 3 diarrhea, took imodium about 8 caps a day. She had a couple episode of nausea, one episode of vomiting, nothing significant. No neuropathy.   She is here before her planned cycle 4-day 1 of docetaxel Herceptin and pertuzumab She is doing well except for the diarrhea. She says medrol dose pak is working [employer] and she is hoping to use with each chemo if possible. When she doesn't take it she had grade 2 diarrhea or more. She otherwise has mild fatigue,no nausea or vomiting. Rest of the pertinent 10 point ROS reviewed and neg.  Patient Active  Problem List   Diagnosis Date Noted   Port-A-Cath in place 07/07/2022   Genetic testing 06/29/2022   Malignant neoplasm of upper-inner quadrant of left breast in female, estrogen receptor positive 06/19/2022   Chronic pain of right knee  03/27/2022   Essential hypertension 03/27/2022   Positive self-administered antigen test for COVID-19 03/15/2021   Prediabetes 07/22/2019   Anxiety and depression 03/15/2016   Vitamin D deficiency 03/15/2016   Metabolic syndrome X 08/07/2013   Elevated random blood glucose level 08/07/2013   Hypertriglyceridemia 08/07/2013   ADD (attention deficit disorder) 07/24/2013    hyperlipidemia 10/09/2012   Morbid obesity 10/09/2012   Hypothyroidism 10/09/2012   B12 deficiency 10/09/2012   Anemia, unspecified 02/12/2011   Dysthymic disorder 02/12/2011   Migraine headache 02/12/2011   Obsessive-compulsive disorder 02/12/2011   Tinnitus 02/12/2011    is allergic to misc. sulfonamide containing compounds, phentermine, bee venom, peanut allergen powder-dnfp, topiramate, septra [sulfamethoxazole-trimethoprim], and sulfa antibiotics.  MEDICAL HISTORY: Past Medical History:  Diagnosis Date   Abdominal hernia    Anemia    b12 deficiency   Anxiety    Arthritis    B12 deficiency    Back pain    Cancer    breast cancer   Depression    Family history of adverse reaction to anesthesia    mother vomits after anesthesia   Gallbladder problem    GERD (gastroesophageal reflux disease)    History of hiatal hernia    "small" per patient   Hypertension    Hypothyroidism    Joint pain    Knee pain    Palpitation    "when iron is low"   Pernicious anemia    SOB (shortness of breath)    Thyroid disease    hypothyroidism   Vitamin D deficiency     SURGICAL HISTORY: Past Surgical History:  Procedure Laterality Date   BREAST BIOPSY Left 06/09/2022   [place] LT BREAST BX W LOC DEV 1ST LESION IMG BX SPEC US GUIDE 06/09/2022 [facility] MAMMOGRAPHY   CESAREAN SECTION  1999   Dilatation and currettagement     for miscarriage 18 years ago   PORTACATH PLACEMENT Right 07/05/2022   Procedure: INSERTION PORT-A-CATH;  Surgeon: Harriette Bouillon, MD;  Location: [facility] OR;  Service: General;  Laterality: Right;     SOCIAL HISTORY: Social History   Socioeconomic History   Marital status: Married    Spouse name: Kathlene November   Number of children: Not on file   Years of education: Not on file   Highest education level: Not on file  Occupational History   Occupation: [employer] copy  Tobacco Use   Smoking status: Never   Smokeless tobacco: Never  Vaping Use   Vaping Use: Never used  Substance and Sexual Activity   Alcohol use: No   Drug use: No   Sexual activity: Yes    Birth control/protection: Pill  Other Topics Concern   Not on file  Social History Narrative   Not on file   Social Determinants of Health   Financial Resource Strain: Not on file  Food Insecurity: No Food Insecurity (06/21/2022)   Hunger Vital Sign    Worried About Running Out of Food in the Last Year: Never true    Ran Out of Food in the Last Year: Never true  Transportation Needs: No Transportation Needs (06/21/2022)   PRAPARE - Administrator, Civil Service (Medical): No    Lack of Transportation (Non-Medical): No  Physical Activity: Not  on file  Stress: Not on file  Social Connections: Not on file  Intimate Partner Violence: Not on file    FAMILY HISTORY: Family History  Problem Relation Age of Onset   Hypertension Mother    Diabetes Mother    High Cholesterol Mother    Thyroid disease Mother    Depression Mother    Anxiety disorder Mother    Obesity Mother    CVA Father        hemorrhagic   Ovarian cancer Maternal Aunt 75   Colon cancer Maternal Aunt    Breast cancer Cousin 78       maternal first cousin, reports negative genetic testing    Review of Systems  Constitutional:  Positive for fatigue. Negative for appetite change, chills, fever and unexpected weight change.  HENT:   Negative for hearing loss, lump/mass, mouth sores and trouble swallowing.   Eyes:  Negative for eye problems and icterus.  Respiratory:  Negative for chest tightness, cough and shortness of breath.    Cardiovascular:  Negative for chest pain, leg swelling and palpitations.  Gastrointestinal:  Positive for diarrhea and nausea. Negative for abdominal distention, abdominal pain, blood in stool, constipation, rectal pain and vomiting.  Endocrine: Negative for hot flashes.  Genitourinary:  Negative for difficulty urinating.   Musculoskeletal:  Negative for arthralgias.  Skin:  Negative for itching and rash.  Neurological:  Negative for dizziness, extremity weakness, headaches, light-headedness and numbness.  Hematological:  Negative for adenopathy. Does not bruise/bleed easily.  Psychiatric/Behavioral:  Negative for depression. The patient is not nervous/anxious.       PHYSICAL EXAMINATION  ECOG PERFORMANCE STATUS: 2 - Symptomatic, <50% confined to bed  Vitals:   09/12/22 0850  BP: (!) 142/77  Pulse: (!) 108  Resp: 19  Temp: (!) 97.2 F (36.2 C)    Physical Exam Constitutional:      General: She is not in acute distress.    Appearance: Normal appearance. She is not ill-appearing or toxic-appearing.  HENT:     Head: Normocephalic and atraumatic.     Mouth/Throat:     Mouth: Mucous membranes are moist.     Pharynx: Oropharynx is clear.  Eyes:     General: No scleral icterus. Cardiovascular:     Rate and Rhythm: Regular rhythm.     Pulses: Normal pulses.     Heart sounds: Normal heart sounds.  Pulmonary:     Effort: Pulmonary effort is normal.     Breath sounds: Normal breath sounds.  Abdominal:     General: Abdomen is flat. Bowel sounds are normal. There is no distension.     Palpations: Abdomen is soft.     Tenderness: There is no abdominal tenderness.  Musculoskeletal:        General: No swelling.     Cervical back: Neck supple.  Lymphadenopathy:     Cervical: No cervical adenopathy.  Skin:    General: Skin is warm and dry.     Findings: No rash.  Neurological:     General: No focal deficit present.     Mental Status: She is alert.  Psychiatric:        Mood  and Affect: Mood normal.        Behavior: Behavior normal.     LABORATORY DATA:  CBC    Component Value Date/Time   WBC 8.3 09/12/2022 0826   WBC 19.1 (H) 10/27/2019 2208   RBC 4.23 09/12/2022 0826   HGB 10.8 (L) 09/12/2022 1610  HGB 12.0 07/21/2021 1511   HCT 34.1 (L) 09/12/2022 0826   HCT 37.6 07/21/2021 1511   PLT 381 09/12/2022 0826   PLT 359 07/21/2021 1511   MCV 80.6 09/12/2022 0826   MCV 76 (L) 07/21/2021 1511   MCH 25.5 (L) 09/12/2022 0826   MCHC 31.7 09/12/2022 0826   RDW 19.2 (H) 09/12/2022 0826   RDW 15.7 (H) 07/21/2021 1511   LYMPHSABS 0.6 (L) 09/12/2022 0826   LYMPHSABS 1.9 07/21/2021 1511   MONOABS 0.1 09/12/2022 0826   EOSABS 0.0 09/12/2022 0826   EOSABS 0.2 07/21/2021 1511   BASOSABS 0.0 09/12/2022 0826   BASOSABS 0.1 07/21/2021 1511    CMP     Component Value Date/Time   NA 139 09/12/2022 0826   NA 139 03/28/2022 0811   K 3.9 09/12/2022 0826   CL 104 09/12/2022 0826   CO2 27 09/12/2022 0826   GLUCOSE 156 (H) 09/12/2022 0826   BUN 11 09/12/2022 0826   BUN 14 03/28/2022 0811   CREATININE 0.71 09/12/2022 0826   CREATININE 0.74 10/09/2012 1537   CALCIUM 9.4 09/12/2022 0826   PROT 7.1 09/12/2022 0826   PROT 7.1 03/28/2022 0811   ALBUMIN 3.9 09/12/2022 0826   ALBUMIN 4.2 03/28/2022 0811   AST 16 09/12/2022 0826   ALT 24 09/12/2022 0826   ALKPHOS 109 09/12/2022 0826   BILITOT 0.4 09/12/2022 0826   GFRNONAA >60 09/12/2022 0826   GFRNONAA >89 10/09/2012 1537   GFRAA 96 05/18/2020 1442   GFRAA >89 10/09/2012 1537       ASSESSMENT and THERAPY PLAN:   Malignant neoplasm of upper-inner quadrant of left breast in female, estrogen receptor positive (HCC) 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 She felt this breast lump back in August 2023 however thought that this is likely  benign and hence did not pursue a mammogram immediately.  She had her mammogram recently in January when she noticed some skin changes in that area.  She also had some CT neck because of swelling in the left neck which was thought to be reactive lymphadenopathy although she was told that this fatty lipoma.  She also reports some dry cough but attributed this to lisinopril.  Physical examination today noticed large left breast and a quadrant mass measuring almost 6 cm in largest dimension, lateral margin not palpable, palpable regional adenopathy and palpable nonregional left lower cervical/supraclavicular adenopathy.   PET unfortunately showed mediastinal adenopathy and non regional cervical adenopathy consistent with met disease.  She started first cycle of docetaxel/Herceptin and pertuzumab with palliative intent.  She had a really tough time after the first cycle with diarrhea, required multiple visits, intravenous fluids.  She is here before her planned cycle4-day 1.  Given poor tolerance to full dose chemo, have reduced the docetaxel dose to 60 mg/m. She is reluctant to omit pertuzumab. She would rather do medrol dosepak with each chemo cycle. Review of imaging per my read with remarkable response, awaiting official read We will aim for 6 cycles of THP followed by maintenance with herceptin/Perjeta Grade 2 diarrhea, well controlled with medrol dose pak and imodium.  We will also plan to repeat imaging after about 6-7 cycles of chemotherapy. Rachel Moulds MD Ok to proceed today if labs are within parameters.  All questions were answered. The patient knows to call the clinic with any problems, questions  or concerns. We can certainly see the patient much sooner if necessary.  Total encounter time:30 minutes*in face-to-face visit time, chart review, lab review, care coordination, order entry, and documentation of the encounter time.    *Total Encounter Time as defined by the Centers for  Medicare and Medicaid Services includes, in addition to the face-to-face time of a patient visit (documented in the note above) non-face-to-face time: obtaining and reviewing outside history, ordering and reviewing medications, tests or procedures, care coordination (communications with other health care professionals or caregivers) and documentation in the medical record.

## 2022-09-14 ENCOUNTER — Other Ambulatory Visit: Payer: Self-pay

## 2022-09-14 ENCOUNTER — Inpatient Hospital Stay: Payer: Commercial Managed Care - PPO

## 2022-09-14 VITALS — BP 137/86 | HR 102 | Temp 99.1°F | Resp 18

## 2022-09-14 DIAGNOSIS — C50212 Malignant neoplasm of upper-inner quadrant of left female breast: Secondary | ICD-10-CM

## 2022-09-14 DIAGNOSIS — Z5112 Encounter for antineoplastic immunotherapy: Secondary | ICD-10-CM | POA: Diagnosis not present

## 2022-09-14 MED ORDER — PEGFILGRASTIM-CBQV 6 MG/0.6ML ~~LOC~~ SOSY
6.0000 mg | PREFILLED_SYRINGE | Freq: Once | SUBCUTANEOUS | Status: AC
  Administered 2022-09-14: 6 mg via SUBCUTANEOUS
  Filled 2022-09-14: qty 0.6

## 2022-09-14 NOTE — Patient Instructions (Signed)

## 2022-09-19 ENCOUNTER — Other Ambulatory Visit: Payer: Self-pay | Admitting: *Deleted

## 2022-09-19 ENCOUNTER — Encounter: Payer: Self-pay | Admitting: Hematology and Oncology

## 2022-09-20 ENCOUNTER — Other Ambulatory Visit: Payer: Self-pay | Admitting: Hematology and Oncology

## 2022-09-20 DIAGNOSIS — C50212 Malignant neoplasm of upper-inner quadrant of left female breast: Secondary | ICD-10-CM

## 2022-09-20 NOTE — Progress Notes (Signed)
Orders signed.

## 2022-09-21 ENCOUNTER — Encounter: Payer: Self-pay | Admitting: Hematology and Oncology

## 2022-09-21 ENCOUNTER — Other Ambulatory Visit: Payer: Self-pay

## 2022-09-29 ENCOUNTER — Ambulatory Visit (HOSPITAL_COMMUNITY)
Admission: RE | Admit: 2022-09-29 | Discharge: 2022-09-29 | Disposition: A | Discharge status: Home / Self Care | Payer: Commercial Managed Care - PPO | Source: Ambulatory Visit | Attending: Hematology and Oncology | Admitting: Hematology and Oncology

## 2022-09-29 ENCOUNTER — Other Ambulatory Visit: Payer: Self-pay

## 2022-09-29 ENCOUNTER — Emergency Department (HOSPITAL_BASED_OUTPATIENT_CLINIC_OR_DEPARTMENT_OTHER): Payer: Commercial Managed Care - PPO

## 2022-09-29 ENCOUNTER — Emergency Department (HOSPITAL_COMMUNITY): Payer: Commercial Managed Care - PPO

## 2022-09-29 ENCOUNTER — Encounter (HOSPITAL_COMMUNITY): Payer: Self-pay

## 2022-09-29 ENCOUNTER — Telehealth: Payer: Self-pay | Admitting: *Deleted

## 2022-09-29 ENCOUNTER — Observation Stay (HOSPITAL_COMMUNITY)
Admission: EM | Admit: 2022-09-29 | Discharge: 2022-09-30 | Disposition: A | Discharge status: Home / Self Care | Payer: Commercial Managed Care - PPO | Attending: Internal Medicine | Admitting: Internal Medicine

## 2022-09-29 DIAGNOSIS — C50212 Malignant neoplasm of upper-inner quadrant of left female breast: Secondary | ICD-10-CM | POA: Diagnosis not present

## 2022-09-29 DIAGNOSIS — I10 Essential (primary) hypertension: Secondary | ICD-10-CM | POA: Insufficient documentation

## 2022-09-29 DIAGNOSIS — E039 Hypothyroidism, unspecified: Secondary | ICD-10-CM | POA: Diagnosis not present

## 2022-09-29 DIAGNOSIS — Z170 Estrogen receptor positive status [ER+]: Secondary | ICD-10-CM

## 2022-09-29 DIAGNOSIS — R112 Nausea with vomiting, unspecified: Secondary | ICD-10-CM | POA: Diagnosis present

## 2022-09-29 DIAGNOSIS — Z79899 Other long term (current) drug therapy: Secondary | ICD-10-CM | POA: Insufficient documentation

## 2022-09-29 DIAGNOSIS — Z0189 Encounter for other specified special examinations: Secondary | ICD-10-CM

## 2022-09-29 DIAGNOSIS — K529 Noninfective gastroenteritis and colitis, unspecified: Secondary | ICD-10-CM | POA: Diagnosis not present

## 2022-09-29 DIAGNOSIS — Z1152 Encounter for screening for COVID-19: Secondary | ICD-10-CM | POA: Insufficient documentation

## 2022-09-29 DIAGNOSIS — Z853 Personal history of malignant neoplasm of breast: Secondary | ICD-10-CM | POA: Diagnosis not present

## 2022-09-29 DIAGNOSIS — A4189 Other specified sepsis: Secondary | ICD-10-CM | POA: Diagnosis not present

## 2022-09-29 DIAGNOSIS — A419 Sepsis, unspecified organism: Secondary | ICD-10-CM

## 2022-09-29 LAB — GASTROINTESTINAL PANEL BY PCR, STOOL (REPLACES STOOL CULTURE)

## 2022-09-29 LAB — URINALYSIS, ROUTINE W REFLEX MICROSCOPIC
Bacteria, UA: NONE SEEN
Bilirubin Urine: NEGATIVE
Glucose, UA: NEGATIVE mg/dL
Ketones, ur: NEGATIVE mg/dL
Nitrite: NEGATIVE
Protein, ur: 30 mg/dL — AB
Specific Gravity, Urine: 1.023 (ref 1.005–1.030)
pH: 5 (ref 5.0–8.0)

## 2022-09-29 LAB — COMPREHENSIVE METABOLIC PANEL
ALT: 37 U/L (ref 0–44)
AST: 26 U/L (ref 15–41)
Albumin: 4 g/dL (ref 3.5–5.0)
Alkaline Phosphatase: 107 U/L (ref 38–126)
Anion gap: 13 (ref 5–15)
BUN: 15 mg/dL (ref 6–20)
CO2: 21 mmol/L — ABNORMAL LOW (ref 22–32)
Calcium: 9.4 mg/dL (ref 8.9–10.3)
Chloride: 104 mmol/L (ref 98–111)
Creatinine, Ser: 1.26 mg/dL — ABNORMAL HIGH (ref 0.44–1.00)
GFR, Estimated: 52 mL/min — ABNORMAL LOW (ref >60)
Glucose, Bld: 154 mg/dL — ABNORMAL HIGH (ref 70–99)
Potassium: 3.5 mmol/L (ref 3.5–5.1)
Sodium: 138 mmol/L (ref 135–145)
Total Bilirubin: 0.6 mg/dL (ref 0.3–1.2)
Total Protein: 7.3 g/dL (ref 6.5–8.1)

## 2022-09-29 LAB — ECHOCARDIOGRAM COMPLETE
Area-P 1/2: 2.4 cm2
Height: 63 in
MV VTI: 1.99 cm2
S' Lateral: 2.4 cm
Weight: 5184 oz

## 2022-09-29 LAB — CBC WITH DIFFERENTIAL/PLATELET
Abs Immature Granulocytes: 0.23 10*3/uL — ABNORMAL HIGH (ref 0.00–0.07)
Basophils Absolute: 0.1 10*3/uL (ref 0.0–0.1)
Basophils Relative: 1 %
Eosinophils Absolute: 0 10*3/uL (ref 0.0–0.5)
Eosinophils Relative: 0 %
HCT: 36.8 % (ref 36.0–46.0)
Hemoglobin: 11.5 g/dL — ABNORMAL LOW (ref 12.0–15.0)
Immature Granulocytes: 1 %
Lymphocytes Relative: 7 %
Lymphs Abs: 1.9 10*3/uL (ref 0.7–4.0)
MCH: 26.1 pg (ref 26.0–34.0)
MCHC: 31.3 g/dL (ref 30.0–36.0)
MCV: 83.6 fL (ref 80.0–100.0)
Monocytes Absolute: 1.4 10*3/uL — ABNORMAL HIGH (ref 0.1–1.0)
Monocytes Relative: 5 %
Neutro Abs: 22.5 10*3/uL — ABNORMAL HIGH (ref 1.7–7.7)
Neutrophils Relative %: 86 %
Platelets: 251 10*3/uL (ref 150–400)
RBC: 4.4 MIL/uL (ref 3.87–5.11)
RDW: 20.8 % — ABNORMAL HIGH (ref 11.5–15.5)
WBC: 26.1 10*3/uL — ABNORMAL HIGH (ref 4.0–10.5)
nRBC: 0.1 % (ref 0.0–0.2)

## 2022-09-29 LAB — C DIFFICILE QUICK SCREEN W PCR REFLEX
C Diff antigen: NEGATIVE
C Diff interpretation: NOT DETECTED
C Diff toxin: NEGATIVE

## 2022-09-29 LAB — MAGNESIUM: Magnesium: 1.8 mg/dL (ref 1.7–2.4)

## 2022-09-29 LAB — LIPASE, BLOOD: Lipase: 26 U/L (ref 11–51)

## 2022-09-29 LAB — LACTIC ACID, PLASMA: Lactic Acid, Venous: 1.3 mmol/L (ref 0.5–1.9)

## 2022-09-29 MED ORDER — SODIUM CHLORIDE 0.9 % IV BOLUS
1000.0000 mL | Freq: Once | INTRAVENOUS | Status: AC
  Administered 2022-09-29: 1000 mL via INTRAVENOUS

## 2022-09-29 MED ORDER — LOPERAMIDE HCL 2 MG PO CAPS
2.0000 mg | ORAL_CAPSULE | ORAL | Status: DC | PRN
  Administered 2022-09-30: 2 mg via ORAL
  Filled 2022-09-29: qty 1

## 2022-09-29 MED ORDER — HYDROMORPHONE HCL 1 MG/ML IJ SOLN
1.0000 mg | Freq: Once | INTRAMUSCULAR | Status: AC
  Administered 2022-09-29: 1 mg via INTRAVENOUS
  Filled 2022-09-29: qty 1

## 2022-09-29 MED ORDER — SODIUM CHLORIDE (PF) 0.9 % IJ SOLN
INTRAMUSCULAR | Status: AC
  Filled 2022-09-29: qty 50

## 2022-09-29 MED ORDER — DEXTROSE IN LACTATED RINGERS 5 % IV SOLN: INTRAVENOUS | Status: DC

## 2022-09-29 MED ORDER — ONDANSETRON 4 MG PO TBDP
4.0000 mg | ORAL_TABLET | Freq: Once | ORAL | Status: AC
  Administered 2022-09-29: 4 mg via ORAL
  Filled 2022-09-29: qty 1

## 2022-09-29 MED ORDER — PIPERACILLIN-TAZOBACTAM 3.375 G IVPB 30 MIN
3.3750 g | INTRAVENOUS | Status: AC
  Administered 2022-09-29: 3.375 g via INTRAVENOUS
  Filled 2022-09-29: qty 50

## 2022-09-29 MED ORDER — VANCOMYCIN HCL 2000 MG/400ML IV SOLN
2000.0000 mg | Freq: Once | INTRAVENOUS | Status: AC
  Administered 2022-09-29: 2000 mg via INTRAVENOUS
  Filled 2022-09-29: qty 400

## 2022-09-29 MED ORDER — VANCOMYCIN HCL 10 G IV SOLR
2000.0000 mg | Freq: Once | INTRAVENOUS | Status: DC
  Filled 2022-09-29: qty 20

## 2022-09-29 MED ORDER — METRONIDAZOLE 500 MG/100ML IV SOLN
500.0000 mg | Freq: Two times a day (BID) | INTRAVENOUS | Status: DC
  Administered 2022-09-29 – 2022-09-30 (×2): 500 mg via INTRAVENOUS
  Filled 2022-09-29 (×2): qty 100

## 2022-09-29 MED ORDER — IOHEXOL 300 MG/ML  SOLN
100.0000 mL | Freq: Once | INTRAMUSCULAR | Status: AC | PRN
  Administered 2022-09-29: 100 mL via INTRAVENOUS

## 2022-09-29 MED ORDER — SODIUM CHLORIDE 0.9 % IV SOLN
2.0000 g | INTRAVENOUS | Status: DC
  Administered 2022-09-29: 2 g via INTRAVENOUS
  Filled 2022-09-29: qty 20

## 2022-09-29 MED ORDER — ONDANSETRON HCL 4 MG/2ML IJ SOLN
4.0000 mg | Freq: Once | INTRAMUSCULAR | Status: AC
  Administered 2022-09-29: 4 mg via INTRAVENOUS
  Filled 2022-09-29: qty 2

## 2022-09-29 NOTE — Progress Notes (Signed)
A consult was received from an ED physician for Vancomycin and Zosyn per pharmacy dosing.  The patient's profile has been reviewed for ht/wt/allergies/indication/available labs.    A one time order has been placed for Vancomycin 2g IV and Zosyn 3.375g IV.  Further antibiotics/pharmacy consults should be ordered by admitting physician if indicated.                       Thank you, Jamse Mead 09/29/2022  2:57 PM

## 2022-09-29 NOTE — ED Notes (Signed)
Pt. Refused to try to provide a urine sample at this time.

## 2022-09-29 NOTE — ED Provider Notes (Signed)
[place] EMERGENCY DEPARTMENT AT [facility] Provider Note   CSN: [id number] Arrival date & time: 09/29/22  1141     History  No chief complaint on file.   Diamond Marshall is a 52 y.o. female with history of breast cancer, chemotherapy, presenting to the ED with nausea, vomiting and diarrhea.  The patient reports abrupt onset of symptoms this morning.  She feels extremely weak.  She says she has had constant diarrhea.  She said similar things to happen in the past when she is on chemo.  He says she has been tested for C. difficile in the past but has always been negative.  She is not on antibiotics.  She reports she does feel lightheaded with her vomiting and diarrhea.  She has a history of breast cancer, estrogen receptor positive, left breast, stage IIIB as of 06/21/22 per oncology office note from Dr Al Pimple on 09/12/22.  Oncologist notes that the patient has had significant diarrhea, nausea and vomiting after her first cycle of this chemotherapy last month.  She did respond favorably to a Medrol Dosepak in the past.  HPI     Home Medications Prior to Admission medications   Medication Sig Start Date End Date Taking? Authorizing Provider  cholestyramine (QUESTRAN) 4 g packet Take 1 packet (4 g total) by mouth 3 (three) times daily with meals. 07/24/22   Walisiewicz, Yvonna Alanis E, PA-C  cyanocobalamin (,VITAMIN B-12,) 1000 MCG/ML injection INJECT 1 ML INTRAMUSCULARLY EVERY 15 DAYS 08/19/21   Mechele Claude, MD  dexamethasone (DECADRON) 4 MG tablet Take 2 tabs by mouth 2 times daily starting day before chemo. Then take 2 tabs daily for 2 days starting day after chemo. Take with food. Patient not taking: Reported on 07/04/2022 07/03/22   Rachel Moulds, MD  diclofenac (VOLTAREN) 75 MG EC tablet TAKE ONE TABLET TWICE DAILY AS NEEDED. needs appointment FOR refills Patient not taking: Reported on 07/04/2022 05/23/22   Mechele Claude, MD  diphenoxylate-atropine (LOMOTIL) 2.5-0.025 MG tablet  TAKE TWO TABLETS EVERY 6 HOURS AS NEEDED FOR DIARRHEA 09/01/22   Shanon Ace, PA-C  EPINEPHrine 0.3 mg/0.3 mL IJ SOAJ injection Inject 0.3 mg into the muscle as needed for anaphylaxis. Patient not taking: Reported on 07/04/2022 12/16/21   Gwenlyn Fudge, FNP  escitalopram (LEXAPRO) 20 MG tablet Take 1 tablet (20 mg total) by mouth daily. 07/21/21   Mechele Claude, MD  fluticasone (FLONASE) 50 MCG/ACT nasal spray Place 1 spray into both nostrils daily. Patient not taking: Reported on 07/04/2022    [provider]  lidocaine-prilocaine (EMLA) cream Apply to affected area once Patient not taking: Reported on 07/04/2022 07/03/22   Rachel Moulds, MD  lisinopril (ZESTRIL) 10 MG tablet Take 10 mg by mouth daily. 05/26/22   [provider]  LORazepam (ATIVAN) 0.5 MG tablet Take 1 tablet (0.5 mg total) by mouth every 8 (eight) hours as needed for anxiety. 06/22/22   Rachel Moulds, MD  methylPREDNISolone (MEDROL DOSEPAK) 4 MG TBPK tablet Take 6 pills by mouth day 1, 5 on day 2, 4 on day 3, 3 on day 4, 2 on day 5, 1 on day 6 09/12/22   Iruku, Burnice Logan, MD  ondansetron (ZOFRAN) 8 MG tablet Take 1 tablet (8 mg total) by mouth every 8 (eight) hours as needed for nausea or vomiting. Patient not taking: Reported on 07/04/2022 07/03/22   Rachel Moulds, MD  oxyCODONE (OXY IR/ROXICODONE) 5 MG immediate release tablet Take 1 tablet (5 mg total) by mouth  every 6 (six) hours as needed for severe pain. 07/05/22   Cornett, Maisie Fus, MD  pantoprazole (PROTONIX) 40 MG tablet Take 2 tablets (80 mg total) by mouth daily. Patient taking differently: Take 40 mg by mouth daily. 07/21/21   Mechele Claude, MD  prochlorperazine (COMPAZINE) 10 MG tablet Take 1 tablet (10 mg total) by mouth every 6 (six) hours as needed for nausea or vomiting. Patient not taking: Reported on 07/04/2022 07/03/22   Rachel Moulds, MD      Allergies    Misc. sulfonamide containing compounds, Phentermine, Bee venom, Peanut allergen  powder-dnfp, Topiramate, Septra [sulfamethoxazole-trimethoprim], and Sulfa antibiotics    Review of Systems   Review of Systems  Physical Exam Updated Vital Signs BP 105/64   Pulse 90   Temp 98.5 F (36.9 C) (Oral)   Resp 15   Ht 5\' 3"  (1.6 m)   Wt (!) 147 kg   SpO2 94%   Breastfeeding Unknown   BMI 57.39 kg/m  Physical Exam Constitutional:      General: She is not in acute distress.    Appearance: She is obese.  HENT:     Head: Normocephalic and atraumatic.  Eyes:     Conjunctiva/sclera: Conjunctivae normal.     Pupils: Pupils are equal, round, and reactive to light.  Cardiovascular:     Rate and Rhythm: Normal rate and regular rhythm.  Pulmonary:     Effort: Pulmonary effort is normal. No respiratory distress.  Abdominal:     General: There is no distension.     Tenderness: There is no abdominal tenderness.  Skin:    General: Skin is warm and dry.  Neurological:     General: No focal deficit present.     Mental Status: She is alert. Mental status is at baseline.  Psychiatric:        Mood and Affect: Mood normal.        Behavior: Behavior normal.     ED Results / Procedures / Treatments   Labs (all labs ordered are listed, but only abnormal results are displayed) Labs Reviewed  COMPREHENSIVE METABOLIC PANEL - Abnormal; Notable for the following components:      Result Value   CO2 21 (*)    Glucose, Bld 154 (*)    Creatinine, Ser 1.26 (*)    GFR, Estimated 52 (*)    All other components within normal limits  CBC WITH DIFFERENTIAL/PLATELET - Abnormal; Notable for the following components:   WBC 26.1 (*)    Hemoglobin 11.5 (*)    RDW 20.8 (*)    Neutro Abs 22.5 (*)    Monocytes Absolute 1.4 (*)    Abs Immature Granulocytes 0.23 (*)    All other components within normal limits  C DIFFICILE QUICK SCREEN W PCR REFLEX    GASTROINTESTINAL PANEL BY PCR, STOOL (REPLACES STOOL CULTURE)  CULTURE, BLOOD (ROUTINE X 2)  CULTURE, BLOOD (ROUTINE X 2)  LIPASE, BLOOD   MAGNESIUM  LACTIC ACID, PLASMA  URINALYSIS, ROUTINE W REFLEX MICROSCOPIC    EKG EKG Interpretation  Date/Time:  Friday Sep 29 2022 12:40:02 EDT Ventricular Rate:  91 PR Interval:  146 QRS Duration: 91 QT Interval:  376 QTC Calculation: 463 R Axis:   10 Text Interpretation: Sinus rhythm  Inferior q waves seen on prior tracing, no sig changes from Jun 29 2022 ecg Confirmed by Alvester Chou [phone]) on 09/29/2022 1:04:02 PM  Radiology ECHOCARDIOGRAM COMPLETE  Result Date: 09/29/2022    ECHOCARDIOGRAM REPORT   Patient  Name:   KAMBRI BARTKIEWICZ Date of Exam: 09/29/2022 Medical Rec #:  [id number]     Height:       63.0 in Accession #:    [id number]    Weight:       324.0 lb Date of Birth:  12/16/1970     BSA:          2.374 m Patient Age:    51 years      BP:           92/59 mmHg Patient Gender: F             HR:           92 bpm. Exam Location:  Inpatient Procedure: 2D Echo, Color Doppler and Cardiac Doppler Indications:    Chemotherapy  History:        Patient has prior history of Echocardiogram examinations. Risk                 Factors:Dyslipidemia and Hypertension.  Sonographer:    Milda Smart Referring Phys: [id number] PRAVEENA IRUKU IMPRESSIONS  1. Left ventricular ejection fraction, by estimation, is 65 to 70%. The left ventricle has normal function. The left ventricle has no regional wall motion abnormalities. There is mild left ventricular hypertrophy. Left ventricular diastolic parameters were normal.  2. Right ventricular systolic function is normal. The right ventricular size is normal. Tricuspid regurgitation signal is inadequate for assessing PA pressure.  3. The mitral valve is normal in structure. No evidence of mitral valve regurgitation.  4. The aortic valve was not well visualized. Aortic valve regurgitation is not visualized. No aortic stenosis is present. FINDINGS  Left Ventricle: Left ventricular ejection fraction, by estimation, is 65 to 70%. The left ventricle has normal function.  The left ventricle has no regional wall motion abnormalities. The left ventricular internal cavity size was small. There is mild left ventricular hypertrophy. Left ventricular diastolic parameters were normal. Right Ventricle: The right ventricular size is normal. No increase in right ventricular wall thickness. Right ventricular systolic function is normal. Tricuspid regurgitation signal is inadequate for assessing PA pressure. Left Atrium: Left atrial size was normal in size. Right Atrium: Right atrial size was normal in size. Pericardium: Trivial pericardial effusion is present. Presence of epicardial fat layer. Mitral Valve: The mitral valve is normal in structure. No evidence of mitral valve regurgitation. MV peak gradient, 10.4 mmHg. The mean mitral valve gradient is 3.0 mmHg. Tricuspid Valve: The tricuspid valve is normal in structure. Tricuspid valve regurgitation is trivial. Aortic Valve: The aortic valve was not well visualized. Aortic valve regurgitation is not visualized. No aortic stenosis is present. Pulmonic Valve: The pulmonic valve was not well visualized. Pulmonic valve regurgitation is not visualized. Aorta: The aortic root is normal in size and structure. IAS/Shunts: The interatrial septum was not well visualized.  LEFT VENTRICLE PLAX 2D LVIDd:         3.20 cm   Diastology LVIDs:         2.40 cm   LV e' medial:    7.07 cm/s LV PW:         1.10 cm   LV E/e' medial:  6.7 LV IVS:        1.10 cm   LV e' lateral:   9.14 cm/s LVOT diam:     1.80 cm   LV E/e' lateral: 5.2 LV SV:         45 LV SV Index:   19 LVOT Area:  2.54 cm  RIGHT VENTRICLE RV S prime:     12.40 cm/s TAPSE (M-mode): 1.4 cm LEFT ATRIUM             Index        RIGHT ATRIUM          Index LA diam:        2.70 cm 1.14 cm/m   RA Area:     7.46 cm LA Vol (A2C):   42.7 ml 17.99 ml/m  RA Volume:   11.10 ml 4.68 ml/m LA Vol (A4C):   32.8 ml 13.82 ml/m LA Biplane Vol: 37.6 ml 15.84 ml/m  AORTIC VALVE LVOT Vmax:   113.00 cm/s LVOT  Vmean:  87.000 cm/s LVOT VTI:    0.178 m  AORTA Ao Root diam: 2.80 cm Ao Asc diam:  2.90 cm MITRAL VALVE MV Area (PHT): 2.40 cm    SHUNTS MV Area VTI:   1.99 cm    Systemic VTI:  0.18 m MV Peak grad:  10.4 mmHg   Systemic Diam: 1.80 cm MV Mean grad:  3.0 mmHg MV Vmax:       1.61 m/s MV Vmean:      75.7 cm/s MV Decel Time: 316 msec MV E velocity: 47.60 cm/s MV A velocity: 68.10 cm/s MV E/A ratio:  0.70 Epifanio Lesches MD Electronically signed by Epifanio Lesches MD Signature Date/Time: 09/29/2022/3:44:17 PM    Final     Procedures .Critical Care  Performed by: Terald Sleeper, MD Authorized by: Terald Sleeper, MD   Critical care provider statement:    Critical care time (minutes):  45   Critical care time was exclusive of:  Separately billable procedures and treating other patients   Critical care was necessary to treat or prevent imminent or life-threatening deterioration of the following conditions:  Sepsis   Critical care was time spent personally by me on the following activities:  Ordering and performing treatments and interventions, ordering and review of laboratory studies, ordering and review of radiographic studies, pulse oximetry, review of old charts, examination of patient and evaluation of patient's response to treatment     Medications Ordered in ED Medications  vancomycin (VANCOREADY) IVPB 2000 mg/400 mL (2,000 mg Intravenous New Bag/Given 09/29/22 1601)  iohexol (OMNIPAQUE) 300 MG/ML solution 100 mL (has no administration in time range)  ondansetron (ZOFRAN-ODT) disintegrating tablet 4 mg (4 mg Oral Given 09/29/22 1214)  sodium chloride 0.9 % bolus 1,000 mL (1,000 mLs Intravenous New Bag/Given 09/29/22 1256)  ondansetron (ZOFRAN) injection 4 mg (4 mg Intravenous Given 09/29/22 1311)  HYDROmorphone (DILAUDID) injection 1 mg (1 mg Intravenous Given 09/29/22 1410)  sodium chloride 0.9 % bolus 1,000 mL (1,000 mLs Intravenous New Bag/Given 09/29/22 1522)   piperacillin-tazobactam (ZOSYN) IVPB 3.375 g (3.375 g Intravenous New Bag/Given 09/29/22 1521)    ED Course/ Medical Decision Making/ A&P Clinical Course as of 09/29/22 1752  Fri Sep 29, 2022  1303 Patient vomited immediately after getting ODT Zofran. Her chest port been accessed and I ordered IV Zofran. [MT]  1415 Complaining of significant abdominal pain.  IV Dilaudid ordered.  CT imaging ordered to evaluate for potential infectious causes of intra-abdominal pain, nausea vomiting and diarrhea, including colitis, given the patient's leukocytosis.  . [MT]  1417 I have requested blood cx, lactate, repeat BP check with RN [MT]  1446 BP 92/59, MAP maintained > 65, I have ordered BS antibiotics and requested regular BP checks with nursing [MT]  1507 Lactate 1.3 - reassuring.  Pt reasssessed and feeling better with pain medications.  Family at bedside.  I explained workup plan, BS antibiotics for empiric coverage, and plan for CT imaging.  Pt reports her blood pressure tends to "fluctuate up and down when I'm having vomiting and diarrhea issues."  [MT]    Clinical Course User Index [MT] Shanya Ferriss, Kermit Balo, MD                             Medical Decision Making Amount and/or Complexity of Data Reviewed Labs: ordered. Radiology: ordered.  Risk Prescription drug management.   This patient presents to the ED with concern for nausea, vomiting, diarrhea. This involves an extensive number of treatment options, and is a complaint that carries with it a high risk of complications and morbidity.  The differential diagnosis includes gastroenteritis versus chemotherapy medication side effect versus colitis versus other  Patient arrived to the ED with copious watery diarrhea.  Co-morbidities that complicate the patient evaluation: History of malignancy on chemotherapy  External records from outside source obtained and reviewed including oncology office note  I ordered and personally interpreted labs.   The pertinent results include: White blood cell count 26.1.  Lactate within normal limits.  Minor increase of creatinine level 1.26.  Magnesium within normal limits.  C. difficile testing is negative  I ordered imaging studies including CT abdomen pelvis, which was pending at the time of signout  The patient was maintained on a cardiac monitor.  I personally viewed and interpreted the cardiac monitored which showed an underlying rhythm of: Regular heart rate  I ordered medication including IV fluid bolus for hypotension, broad-spectrum IV antibiotics per sepsis protocol (unclear source).  IV Dilaudid and Zofran for nausea and abdominal pain  I have reviewed the patients home medicines and have made adjustments as needed  Test Considered: Low suspicion for acute PE clinically.   After the interventions noted above, I reevaluated the patient and found that they have: improved   Dispostion:  Patient signed out to Dr Gerhard Munch pending f/u on UA and CT abdomen          Final Clinical Impression(s) / ED Diagnoses Final diagnoses:  Sepsis, due to unspecified organism, unspecified whether acute organ dysfunction present [facility])    Rx / DC Orders ED Discharge Orders     None         Chelesa Weingartner, Kermit Balo, MD 09/29/22 1752

## 2022-09-29 NOTE — H&P (Signed)
History and Physical    Patient: Diamond Marshall:[id number] DOB: 1970-07-22 DOA: 09/29/2022 DOS: the patient was seen and examined on 09/29/2022 PCP: Mechele Claude, MD  Patient coming from: Home  Chief Complaint: No chief complaint on file.  HPI: Diamond Marshall is a 52 y.o. female with medical history significant of breast cancer for which she is receiving chemotherapy.  Patient's last chemo was over 2 weeks ago.  Patient occasionally does get diarrhea from her chemotherapy but this typically happens within a week of getting the infusion.  And resolves within the next several 4 to 5 days.  Patient reports being in her usual state of health till she went to bed last evening.  Since getting up this morning patient reports acute abrupt onset of diarrhea watery brown 20 episodes already uncontrollable.  Associated with 4-5 episodes of vomiting nonbloody.  Associated with epigastric area cramping.  Patient developed marked fatigue/frustration and lost appetite.  There is no report of fever rigors trauma.  ER course is notable for patient given Dilaudid with resolution of pain.  However patient continues to have diarrhea.  No episode of vomiting in the ER.  Medical evaluation is sought  Review of Systems: As mentioned in the history of present illness. All other systems reviewed and are negative. Past Medical History:  Diagnosis Date   Abdominal hernia    Anemia    b12 deficiency   Anxiety    Arthritis    B12 deficiency    Back pain    Cancer (HCC)    breast cancer   Depression    Family history of adverse reaction to anesthesia    mother vomits after anesthesia   Gallbladder problem    GERD (gastroesophageal reflux disease)    History of hiatal hernia    "small" per patient   Hypertension    Hypothyroidism    Joint pain    Knee pain    Palpitation    "when iron is low"   Pernicious anemia    SOB (shortness of breath)    Thyroid disease    hypothyroidism   Vitamin D deficiency     Past Surgical History:  Procedure Laterality Date   BREAST BIOPSY Left 06/09/2022   [place] LT BREAST BX W LOC DEV 1ST LESION IMG BX SPEC US GUIDE 06/09/2022 [facility]   CESAREAN SECTION  1999   Dilatation and currettagement     for miscarriage 18 years ago   PORTACATH PLACEMENT Right 07/05/2022   Procedure: INSERTION PORT-A-CATH;  Surgeon: Harriette Bouillon, MD;  Location: [facility] OR;  Service: General;  Laterality: Right;   Social History:  reports that she has never smoked. She has never used smokeless tobacco. She reports that she does not drink alcohol and does not use drugs.  Allergies  Allergen Reactions   Misc. Sulfonamide Containing Compounds Rash and Itching   Phentermine     Became suicidal on the medication   Bee Venom Swelling   Peanut Allergen Powder-Dnfp Other (See Comments)    Migraine   Topiramate Other (See Comments)    dizziness   Septra [Sulfamethoxazole-Trimethoprim] Rash   Sulfa Antibiotics Rash    Family History  Problem Relation Age of Onset   Hypertension Mother    Diabetes Mother    High Cholesterol Mother    Thyroid disease Mother    Depression Mother    Anxiety disorder Mother    Obesity Mother    CVA Father  hemorrhagic   Ovarian cancer Maternal Aunt 54   Colon cancer Maternal Aunt    Breast cancer Cousin 67       maternal first cousin, reports negative genetic testing    Prior to Admission medications   Medication Sig Start Date End Date Taking? Authorizing Provider  cholestyramine (QUESTRAN) 4 g packet Take 1 packet (4 g total) by mouth 3 (three) times daily with meals. 07/24/22   Walisiewicz, Yvonna Alanis E, PA-C  cyanocobalamin (,VITAMIN B-12,) 1000 MCG/ML injection INJECT 1 ML INTRAMUSCULARLY EVERY 15 DAYS 08/19/21   Mechele Claude, MD  dexamethasone (DECADRON) 4 MG tablet Take 2 tabs by mouth 2 times daily starting day before chemo. Then take 2 tabs daily for 2 days starting day after chemo. Take with food. Patient not taking: Reported on  07/04/2022 07/03/22   Rachel Moulds, MD  diclofenac (VOLTAREN) 75 MG EC tablet TAKE ONE TABLET TWICE DAILY AS NEEDED. needs appointment FOR refills Patient not taking: Reported on 07/04/2022 05/23/22   Mechele Claude, MD  diphenoxylate-atropine (LOMOTIL) 2.5-0.025 MG tablet TAKE TWO TABLETS EVERY 6 HOURS AS NEEDED FOR DIARRHEA 09/01/22   Shanon Ace, PA-C  EPINEPHrine 0.3 mg/0.3 mL IJ SOAJ injection Inject 0.3 mg into the muscle as needed for anaphylaxis. Patient not taking: Reported on 07/04/2022 12/16/21   Gwenlyn Fudge, FNP  escitalopram (LEXAPRO) 20 MG tablet Take 1 tablet (20 mg total) by mouth daily. 07/21/21   Mechele Claude, MD  fluticasone (FLONASE) 50 MCG/ACT nasal spray Place 1 spray into both nostrils daily. Patient not taking: Reported on 07/04/2022    [provider]  lidocaine-prilocaine (EMLA) cream Apply to affected area once Patient not taking: Reported on 07/04/2022 07/03/22   Rachel Moulds, MD  lisinopril (ZESTRIL) 10 MG tablet Take 10 mg by mouth daily. 05/26/22   [provider]  LORazepam (ATIVAN) 0.5 MG tablet Take 1 tablet (0.5 mg total) by mouth every 8 (eight) hours as needed for anxiety. 06/22/22   Rachel Moulds, MD  methylPREDNISolone (MEDROL DOSEPAK) 4 MG TBPK tablet Take 6 pills by mouth day 1, 5 on day 2, 4 on day 3, 3 on day 4, 2 on day 5, 1 on day 6 09/12/22   Iruku, Burnice Logan, MD  ondansetron (ZOFRAN) 8 MG tablet Take 1 tablet (8 mg total) by mouth every 8 (eight) hours as needed for nausea or vomiting. Patient not taking: Reported on 07/04/2022 07/03/22   Rachel Moulds, MD  oxyCODONE (OXY IR/ROXICODONE) 5 MG immediate release tablet Take 1 tablet (5 mg total) by mouth every 6 (six) hours as needed for severe pain. 07/05/22   Cornett, Maisie Fus, MD  pantoprazole (PROTONIX) 40 MG tablet Take 2 tablets (80 mg total) by mouth daily. Patient taking differently: Take 40 mg by mouth daily. 07/21/21   Mechele Claude, MD  prochlorperazine (COMPAZINE) 10 MG  tablet Take 1 tablet (10 mg total) by mouth every 6 (six) hours as needed for nausea or vomiting. Patient not taking: Reported on 07/04/2022 07/03/22   Rachel Moulds, MD    Physical Exam: Vitals:   09/29/22 1620 09/29/22 1800 09/29/22 1900 09/29/22 2030  BP:  116/68 (!) 123/55 101/69  Pulse:  91 92 95  Resp:  (!) 22 13 18   Temp: 98.5 F (36.9 C)   98.1 F (36.7 C)  TempSrc: Oral     SpO2:  97% 93% 93%  Weight:      Height:       General: Patient does not appear to be  in any distress, husband at the bedside Obese Respiratory exam: Bilateral intravesicular Cardiovascular exam S1-S2 normal Abdomen soft nontender Extremities warm without edema Patient is awake alert giving coherent account of history no focal motor deficit. Data Reviewed:  Labs on Admission:  Results for orders placed or performed during the hospital encounter of 09/29/22 (from the past 24 hour(s))  Comprehensive metabolic panel     Status: Abnormal   Collection Time: 09/29/22 12:27 PM  Result Value Ref Range   Sodium 138 135 - 145 mmol/L   Potassium 3.5 3.5 - 5.1 mmol/L   Chloride 104 98 - 111 mmol/L   CO2 21 (L) 22 - 32 mmol/L   Glucose, Bld 154 (H) 70 - 99 mg/dL   BUN 15 6 - 20 mg/dL   Creatinine, Ser 4.09 (H) 0.44 - 1.00 mg/dL   Calcium 9.4 8.9 - 81.1 mg/dL   Total Protein 7.3 6.5 - 8.1 g/dL   Albumin 4.0 3.5 - 5.0 g/dL   AST 26 15 - 41 U/L   ALT 37 0 - 44 U/L   Alkaline Phosphatase 107 38 - 126 U/L   Total Bilirubin 0.6 0.3 - 1.2 mg/dL   GFR, Estimated 52 (L) >60 mL/min   Anion gap 13 5 - 15  CBC with Differential     Status: Abnormal   Collection Time: 09/29/22 12:27 PM  Result Value Ref Range   WBC 26.1 (H) 4.0 - 10.5 K/uL   RBC 4.40 3.87 - 5.11 MIL/uL   Hemoglobin 11.5 (L) 12.0 - 15.0 g/dL   HCT 91.4 78.2 - 95.6 %   MCV 83.6 80.0 - 100.0 fL   MCH 26.1 26.0 - 34.0 pg   MCHC 31.3 30.0 - 36.0 g/dL   RDW 21.3 (H) 08.6 - 57.8 %   Platelets 251 150 - 400 K/uL   nRBC 0.1 0.0 - 0.2 %    Neutrophils Relative % 86 %   Neutro Abs 22.5 (H) 1.7 - 7.7 K/uL   Lymphocytes Relative 7 %   Lymphs Abs 1.9 0.7 - 4.0 K/uL   Monocytes Relative 5 %   Monocytes Absolute 1.4 (H) 0.1 - 1.0 K/uL   Eosinophils Relative 0 %   Eosinophils Absolute 0.0 0.0 - 0.5 K/uL   Basophils Relative 1 %   Basophils Absolute 0.1 0.0 - 0.1 K/uL   Immature Granulocytes 1 %   Abs Immature Granulocytes 0.23 (H) 0.00 - 0.07 K/uL  Lipase, blood     Status: None   Collection Time: 09/29/22 12:27 PM  Result Value Ref Range   Lipase 26 11 - 51 U/L  Urinalysis, Routine w reflex microscopic -Urine, Clean Catch     Status: Abnormal   Collection Time: 09/29/22 12:27 PM  Result Value Ref Range   Color, Urine YELLOW YELLOW   APPearance CLOUDY (A) CLEAR   Specific Gravity, Urine 1.023 1.005 - 1.030   pH 5.0 5.0 - 8.0   Glucose, UA NEGATIVE NEGATIVE mg/dL   Hgb urine dipstick SMALL (A) NEGATIVE   Bilirubin Urine NEGATIVE NEGATIVE   Ketones, ur NEGATIVE NEGATIVE mg/dL   Protein, ur 30 (A) NEGATIVE mg/dL   Nitrite NEGATIVE NEGATIVE   Leukocytes,Ua SMALL (A) NEGATIVE   RBC / HPF 0-5 0 - 5 RBC/hpf   WBC, UA 11-20 0 - 5 WBC/hpf   Bacteria, UA NONE SEEN NONE SEEN   Squamous Epithelial / HPF 0-5 0 - 5 /HPF   Mucus PRESENT    Ca Oxalate Crys, UA PRESENT  Magnesium     Status: None   Collection Time: 09/29/22 12:27 PM  Result Value Ref Range   Magnesium 1.8 1.7 - 2.4 mg/dL  Gastrointestinal Panel by PCR , Stool     Status: None   Collection Time: 09/29/22 12:27 PM   Specimen: STOOL  Result Value Ref Range   Campylobacter species NOT DETECTED NOT DETECTED   Plesimonas shigelloides NOT DETECTED NOT DETECTED   Salmonella species NOT DETECTED NOT DETECTED   Yersinia enterocolitica NOT DETECTED NOT DETECTED   Vibrio species NOT DETECTED NOT DETECTED   Vibrio cholerae NOT DETECTED NOT DETECTED   Enteroaggregative E coli (EAEC) NOT DETECTED NOT DETECTED   Enteropathogenic E coli (EPEC) NOT DETECTED NOT DETECTED    Enterotoxigenic E coli (ETEC) NOT DETECTED NOT DETECTED   Shiga like toxin producing E coli (STEC) NOT DETECTED NOT DETECTED   Shigella/Enteroinvasive E coli (EIEC) NOT DETECTED NOT DETECTED   Cryptosporidium NOT DETECTED NOT DETECTED   Cyclospora cayetanensis NOT DETECTED NOT DETECTED   Entamoeba histolytica NOT DETECTED NOT DETECTED   Giardia lamblia NOT DETECTED NOT DETECTED   Adenovirus F40/41 NOT DETECTED NOT DETECTED   Astrovirus NOT DETECTED NOT DETECTED   Norovirus GI/GII NOT DETECTED NOT DETECTED   Rotavirus A NOT DETECTED NOT DETECTED   Sapovirus (I, II, IV, and V) NOT DETECTED NOT DETECTED  C Difficile Quick Screen w PCR reflex     Status: None   Collection Time: 09/29/22 12:27 PM   Specimen: STOOL  Result Value Ref Range   C Diff antigen NEGATIVE NEGATIVE   C Diff toxin NEGATIVE NEGATIVE   C Diff interpretation No C. difficile detected.   Lactic acid, plasma     Status: None   Collection Time: 09/29/22  2:32 PM  Result Value Ref Range   Lactic Acid, Venous 1.3 0.5 - 1.9 mmol/L     Radiological Exams on Admission:  CT ABDOMEN PELVIS W CONTRAST  Result Date: 09/29/2022 CLINICAL DATA:  Immunotherapy, possible toxicity with colitis. Breast cancer. EXAM: CT ABDOMEN AND PELVIS WITH CONTRAST TECHNIQUE: Multidetector CT imaging of the abdomen and pelvis was performed using the standard protocol following bolus administration of intravenous contrast. RADIATION DOSE REDUCTION: This exam was performed according to the departmental dose-optimization program which includes automated exposure control, adjustment of the mA and/or kV according to patient size and/or use of iterative reconstruction technique. CONTRAST:  OMNIPAQUE IOHEXOL 300 MG/ML  SOLN COMPARISON:  CT chest abdomen and pelvis 09/11/2022 FINDINGS: Lower chest: There are minimal patchy ground-glass opacities in the lung bases. Hepatobiliary: No focal liver abnormality is seen. No gallstones, gallbladder wall thickening,  or biliary dilatation. Pancreas: Unremarkable. No pancreatic ductal dilatation or surrounding inflammatory changes. Spleen: Normal in size without focal abnormality. Adrenals/Urinary Tract: Adrenal glands are unremarkable. Kidneys are normal, without renal calculi, focal lesion, or hydronephrosis. Bladder is unremarkable. Stomach/Bowel: Stomach is within normal limits. Appendix appears normal. No evidence of bowel wall thickening, distention, or inflammatory changes. The colon is predominantly fluid-filled. There are few scattered sigmoid colon diverticula. Vascular/Lymphatic: No significant vascular findings are present. No enlarged abdominal or pelvic lymph nodes. Reproductive: Uterus and bilateral adnexa are unremarkable. Other: There is a small fat containing umbilical hernia. No abdominopelvic ascites. Musculoskeletal: No acute or significant osseous findings. IMPRESSION: 1. The colon is predominantly fluid-filled compatible with diarrheal illness. 2. Minimal sigmoid colon diverticulosis without evidence for diverticulitis. 3. Minimal patchy ground-glass opacities in the lung bases, likely infectious/inflammatory. Electronically Signed   By: Linton Rump  Malena Edman M.D.   On: 09/29/2022 20:09   ECHOCARDIOGRAM COMPLETE  Result Date: 09/29/2022    ECHOCARDIOGRAM REPORT   Patient Name:   SWETA MAIDA Date of Exam: 09/29/2022 Medical Rec #:  [id number]     Height:       63.0 in Accession #:    [id number]    Weight:       324.0 lb Date of Birth:  [birth year]     BSA:          2.374 m Patient Age:    51 years      BP:           92/59 mmHg Patient Gender: F             HR:           92 bpm. Exam Location:  Inpatient Procedure: 2D Echo, Color Doppler and Cardiac Doppler Indications:    Chemotherapy  History:        Patient has prior history of Echocardiogram examinations. Risk                 Factors:Dyslipidemia and Hypertension.  Sonographer:    Milda Smart Referring Phys: [id number] PRAVEENA IRUKU IMPRESSIONS  1. Left  ventricular ejection fraction, by estimation, is 65 to 70%. The left ventricle has normal function. The left ventricle has no regional wall motion abnormalities. There is mild left ventricular hypertrophy. Left ventricular diastolic parameters were normal.  2. Right ventricular systolic function is normal. The right ventricular size is normal. Tricuspid regurgitation signal is inadequate for assessing PA pressure.  3. The mitral valve is normal in structure. No evidence of mitral valve regurgitation.  4. The aortic valve was not well visualized. Aortic valve regurgitation is not visualized. No aortic stenosis is present. FINDINGS  Left Ventricle: Left ventricular ejection fraction, by estimation, is 65 to 70%. The left ventricle has normal function. The left ventricle has no regional wall motion abnormalities. The left ventricular internal cavity size was small. There is mild left ventricular hypertrophy. Left ventricular diastolic parameters were normal. Right Ventricle: The right ventricular size is normal. No increase in right ventricular wall thickness. Right ventricular systolic function is normal. Tricuspid regurgitation signal is inadequate for assessing PA pressure. Left Atrium: Left atrial size was normal in size. Right Atrium: Right atrial size was normal in size. Pericardium: Trivial pericardial effusion is present. Presence of epicardial fat layer. Mitral Valve: The mitral valve is normal in structure. No evidence of mitral valve regurgitation. MV peak gradient, 10.4 mmHg. The mean mitral valve gradient is 3.0 mmHg. Tricuspid Valve: The tricuspid valve is normal in structure. Tricuspid valve regurgitation is trivial. Aortic Valve: The aortic valve was not well visualized. Aortic valve regurgitation is not visualized. No aortic stenosis is present. Pulmonic Valve: The pulmonic valve was not well visualized. Pulmonic valve regurgitation is not visualized. Aorta: The aortic root is normal in size and  structure. IAS/Shunts: The interatrial septum was not well visualized.  LEFT VENTRICLE PLAX 2D LVIDd:         3.20 cm   Diastology LVIDs:         2.40 cm   LV e' medial:    7.07 cm/s LV PW:         1.10 cm   LV E/e' medial:  6.7 LV IVS:        1.10 cm   LV e' lateral:   9.14 cm/s LVOT diam:     1.80 cm  LV E/e' lateral: 5.2 LV SV:         45 LV SV Index:   19 LVOT Area:     2.54 cm  RIGHT VENTRICLE RV S prime:     12.40 cm/s TAPSE (M-mode): 1.4 cm LEFT ATRIUM             Index        RIGHT ATRIUM          Index LA diam:        2.70 cm 1.14 cm/m   RA Area:     7.46 cm LA Vol (A2C):   42.7 ml 17.99 ml/m  RA Volume:   11.10 ml 4.68 ml/m LA Vol (A4C):   32.8 ml 13.82 ml/m LA Biplane Vol: 37.6 ml 15.84 ml/m  AORTIC VALVE LVOT Vmax:   113.00 cm/s LVOT Vmean:  87.000 cm/s LVOT VTI:    0.178 m  AORTA Ao Root diam: 2.80 cm Ao Asc diam:  2.90 cm MITRAL VALVE MV Area (PHT): 2.40 cm    SHUNTS MV Area VTI:   1.99 cm    Systemic VTI:  0.18 m MV Peak grad:  10.4 mmHg   Systemic Diam: 1.80 cm MV Mean grad:  3.0 mmHg MV Vmax:       1.61 m/s MV Vmean:      75.7 cm/s MV Decel Time: 316 msec MV E velocity: 47.60 cm/s MV A velocity: 68.10 cm/s MV E/A ratio:  0.70 Epifanio Lesches MD Electronically signed by Epifanio Lesches MD Signature Date/Time: 09/29/2022/3:44:17 PM    Final     EKG: Independently reviewed. NSR   Assessment and Plan: * Gastroenteritis On Sep 29, 2022 with up to 20 watery loose bowel movements.  C. difficile is negative GI pathogen panel is negative.  Patient looking nontoxic.  S/p vancomycin and Zosyn.  .  Patient leukocytosis of 26,000 must be interpreted in the context of 19,000 of leukocytosis about 4 weeks ago.  At this time I will continue patient with ceftriaxone and metronidazole.  However I will also treat the patient with Imodium for symptom control.  I will treat the patient with lactated Ringer 125 cc an hour.  At this time patient is not hungry, will start diet in the morning.   Creatinine bumped to 1.26 is likely due to dehydration, I will monitor response to fluid intake.      Advance Care Planning:   Code Status: Full Code   Consults:   Family Communication: Husband at the bedside  Severity of Illness: The appropriate patient status for this patient is OBSERVATION. Observation status is judged to be reasonable and necessary in order to provide the required intensity of service to ensure the patient's safety. The patient's presenting symptoms, physical exam findings, and initial radiographic and laboratory data in the context of their medical condition is felt to place them at decreased risk for further clinical deterioration. Furthermore, it is anticipated that the patient will be medically stable for discharge from the hospital within 2 midnights of admission.   Author: Nolberto Hanlon, MD 09/29/2022 10:55 PM  For on call review [email or web address].

## 2022-09-29 NOTE — Progress Notes (Signed)
  Echocardiogram 2D Echocardiogram has been performed.  Milda Smart 09/29/2022, 3:18 PM

## 2022-09-29 NOTE — ED Triage Notes (Signed)
Pt was in outpt echo and begin to become nauseated, vomiting, and diarrhea.

## 2022-09-29 NOTE — Assessment & Plan Note (Addendum)
On Sep 29, 2022 with up to 20 watery loose bowel movements.  C. difficile is negative GI pathogen panel is negative.  Patient looking nontoxic.  S/p vancomycin and Zosyn.  .  Patient leukocytosis of 26,000 must be interpreted in the context of 19,000 of leukocytosis about 4 weeks ago.  At this time I will continue patient with ceftriaxone and metronidazole.  However I will also treat the patient with Imodium for symptom control.  I will treat the patient with lactated Ringer 125 cc an hour.  At this time patient is not hungry, will start diet in the morning.  Creatinine bumped to 1.26 is likely due to dehydration, I will monitor response to fluid intake.

## 2022-09-29 NOTE — Telephone Encounter (Signed)
This RN was notified by vascular lab stating is there to obtain her ECHO- " is not doing well at all- she is nauseated and vomiting and has soiled herself "  Pt will be taken to the ER at this time due to [facility] provider is not available today.  This RN will inform MD.

## 2022-09-29 NOTE — ED Provider Notes (Signed)
9:30 PM Care of the patient assumed at signout.  Patient is feeling somewhat better, but with evidence for acute kidney injury in the context of nausea, vomiting, concern for sepsis patient will be admitted for further monitoring, management.   Gerhard Munch, MD 09/29/22 2131

## 2022-09-29 NOTE — ED Notes (Signed)
Pt was able to walk to the bathroom, she did had a BM and Urinate.

## 2022-09-29 NOTE — Progress Notes (Signed)
  Echocardiogram Pt presented for OP echo with symptoms of N/V and diarrhea. Contacted ED charge nurse and admitted her. Informed her oncologist, Dr. Al Pimple.   Diamond Marshall 09/29/2022, 11:47 AM

## 2022-09-30 ENCOUNTER — Other Ambulatory Visit: Payer: Self-pay

## 2022-09-30 ENCOUNTER — Encounter (HOSPITAL_COMMUNITY): Payer: Self-pay | Admitting: Internal Medicine

## 2022-09-30 DIAGNOSIS — K529 Noninfective gastroenteritis and colitis, unspecified: Secondary | ICD-10-CM

## 2022-09-30 LAB — BASIC METABOLIC PANEL
Anion gap: 9 (ref 5–15)
BUN: 13 mg/dL (ref 6–20)
CO2: 21 mmol/L — ABNORMAL LOW (ref 22–32)
Calcium: 8.3 mg/dL — ABNORMAL LOW (ref 8.9–10.3)
Chloride: 107 mmol/L (ref 98–111)
Creatinine, Ser: 1.04 mg/dL — ABNORMAL HIGH (ref 0.44–1.00)
GFR, Estimated: >60 mL/min (ref >60)
Glucose, Bld: 110 mg/dL — ABNORMAL HIGH (ref 70–99)
Potassium: 3.6 mmol/L (ref 3.5–5.1)
Sodium: 137 mmol/L (ref 135–145)

## 2022-09-30 LAB — CBC
HCT: 29.3 % — ABNORMAL LOW (ref 36.0–46.0)
Hemoglobin: 8.9 g/dL — ABNORMAL LOW (ref 12.0–15.0)
MCH: 25.8 pg — ABNORMAL LOW (ref 26.0–34.0)
MCHC: 30.4 g/dL (ref 30.0–36.0)
MCV: 84.9 fL (ref 80.0–100.0)
Platelets: 212 10*3/uL (ref 150–400)
RBC: 3.45 MIL/uL — ABNORMAL LOW (ref 3.87–5.11)
RDW: 20.8 % — ABNORMAL HIGH (ref 11.5–15.5)
WBC: 11 10*3/uL — ABNORMAL HIGH (ref 4.0–10.5)
nRBC: 0 % (ref 0.0–0.2)

## 2022-09-30 LAB — PROTIME-INR
INR: 1 (ref 0.8–1.2)
Prothrombin Time: 13.7 seconds (ref 11.4–15.2)

## 2022-09-30 LAB — CREATININE, SERUM
Creatinine, Ser: 1.03 mg/dL — ABNORMAL HIGH (ref 0.44–1.00)
GFR, Estimated: >60 mL/min (ref >60)

## 2022-09-30 LAB — APTT: aPTT: 24 seconds (ref 24–36)

## 2022-09-30 LAB — HIV ANTIBODY (ROUTINE TESTING W REFLEX): HIV Screen 4th Generation wRfx: NONREACTIVE

## 2022-09-30 LAB — SARS CORONAVIRUS 2 BY RT PCR: SARS Coronavirus 2 by RT PCR: NEGATIVE

## 2022-09-30 MED ORDER — LORAZEPAM 0.5 MG PO TABS: 0.5000 mg | ORAL_TABLET | Freq: Three times a day (TID) | ORAL | Status: DC | PRN

## 2022-09-30 MED ORDER — CHLORHEXIDINE GLUCONATE CLOTH 2 % EX PADS: 6.0000 | MEDICATED_PAD | Freq: Every day | CUTANEOUS | Status: DC

## 2022-09-30 MED ORDER — ESCITALOPRAM OXALATE 20 MG PO TABS
20.0000 mg | ORAL_TABLET | Freq: Every day | ORAL | Status: DC
  Administered 2022-09-30: 20 mg via ORAL
  Filled 2022-09-30: qty 1

## 2022-09-30 MED ORDER — OXYCODONE HCL 5 MG PO TABS: 5.0000 mg | ORAL_TABLET | Freq: Four times a day (QID) | ORAL | Status: DC | PRN

## 2022-09-30 MED ORDER — HEPARIN SOD (PORK) LOCK FLUSH 100 UNIT/ML IV SOLN
500.0000 [IU] | Freq: Once | INTRAVENOUS | Status: DC
  Filled 2022-09-30: qty 5

## 2022-09-30 MED ORDER — ONDANSETRON HCL 8 MG PO TABS
8.0000 mg | ORAL_TABLET | Freq: Three times a day (TID) | ORAL | Status: DC | PRN
  Filled 2022-09-30: qty 1

## 2022-09-30 MED ORDER — HEPARIN SOD (PORK) LOCK FLUSH 100 UNIT/ML IV SOLN: 500.0000 [IU] | Freq: Once | INTRAVENOUS | Status: AC

## 2022-09-30 MED ORDER — ENOXAPARIN SODIUM 80 MG/0.8ML IJ SOSY
70.0000 mg | PREFILLED_SYRINGE | INTRAMUSCULAR | Status: DC
  Administered 2022-09-30: 70 mg via SUBCUTANEOUS
  Filled 2022-09-30: qty 0.8

## 2022-09-30 MED ORDER — PANTOPRAZOLE SODIUM 40 MG PO TBEC
40.0000 mg | DELAYED_RELEASE_TABLET | Freq: Every day | ORAL | Status: DC
  Administered 2022-09-30: 40 mg via ORAL
  Filled 2022-09-30: qty 1

## 2022-09-30 MED ORDER — EPINEPHRINE 0.3 MG/0.3ML IJ SOAJ: 0.3000 mg | INTRAMUSCULAR | Status: DC | PRN

## 2022-09-30 NOTE — Discharge Summary (Signed)
Physician Discharge Summary  Diamond Marshall WUJ:[id number] DOB: 1970/11/16 DOA: 09/29/2022  PCP: Mechele Claude, MD  Admit date: 09/29/2022 Discharge date: 09/30/2022  Admitted From: Home Disposition: Home  Recommendations for Outpatient Follow-up:  Follow up with PCP in 1-2 weeks Please obtain BMP/CBC/magnesium/phosphorus in one week Follow-up blood cultures.  Discharge Condition: Stable CODE STATUS: Full code Diet recommendation: Regular diet  Discharge summary: 52 year old with recent diagnosis of breast cancer currently on chemotherapy with Taxol, last chemo 2 weeks ago presented with intractable nausea, multiple episodes of diarrhea and feeling very weak since 1 day.  She also had diffuse abdominal pain after vomiting more likely epigastric cramping.  No fever.  Marked fatigue.  In the emergency room pain improved with Dilaudid, continue to have diarrhea.  Noted to have WBC count of 26,000 and elevated creatinine so admitted to the hospital for treatment.  Acute diarrheal illness: Possible viral gastroenteritis, possible chemo induced enteritis and diarrhea. Clinical dehydration as evidenced by leukocytosis and AKI. Sepsis ruled out.  Likely due to dehydration.  Overnight treated with aggressive IV fluids and also received Rocephin and Flagyl. C. difficile negative. Appetite improved and currently tolerating regular diet.  Frequency and volume of diarrhea has improved.  Abdominal pain has improved. WC count has improved and normalized.  Renal functions improved and normalized.  Electrolytes are adequate. With good clinical improvement and currently able to hydrate by mouth with liquids and regular diet, patient is willing to go home and manage her diarrheal illness at home.  There is no evidence of bacterial infection, however blood cultures are pending.  Patient will be discharged home, she will use Lomotil as needed. She will resume all her home medications except blood pressure  medications that she will resume after her diarrheal illness is over.    Discharge Diagnoses:  Principal Problem:   Gastroenteritis    Discharge Instructions  Discharge Instructions     Diet - low sodium heart healthy   Complete by: As directed    Extra oral hydration   Discharge instructions   Complete by: As directed    Hold your blood pressure medicine until diarrhea is better   Increase activity slowly   Complete by: As directed       Allergies as of 09/30/2022       Reactions   Misc. Sulfonamide Containing Compounds Rash, Itching   Phentermine    Became suicidal on the medication   Bee Venom Swelling   Peanut Allergen Powder-dnfp Other (See Comments)   Migraine   Topiramate Other (See Comments)   dizziness   Septra [sulfamethoxazole-trimethoprim] Rash   Sulfa Antibiotics Rash        Medication List     STOP taking these medications    methylPREDNISolone 4 MG Tbpk tablet Commonly known as: MEDROL DOSEPAK       TAKE these medications    cholestyramine 4 g packet Commonly known as: Questran Take 1 packet (4 g total) by mouth 3 (three) times daily with meals. What changed:  when to take this reasons to take this   cyanocobalamin 1000 MCG/ML injection Commonly known as: VITAMIN B12 INJECT 1 ML INTRAMUSCULARLY EVERY 15 DAYS What changed: See the new instructions.   dexamethasone 4 MG tablet Commonly known as: DECADRON Take 2 tabs by mouth 2 times daily starting day before chemo. Then take 2 tabs daily for 2 days starting day after chemo. Take with food.   diclofenac 75 MG EC tablet Commonly known as: VOLTAREN TAKE ONE  TABLET TWICE DAILY AS NEEDED. needs appointment FOR refills   diphenoxylate-atropine 2.5-0.025 MG tablet Commonly known as: LOMOTIL TAKE TWO TABLETS EVERY 6 HOURS AS NEEDED FOR DIARRHEA   EPINEPHrine 0.3 mg/0.3 mL Soaj injection Commonly known as: EPI-PEN Inject 0.3 mg into the muscle as needed for anaphylaxis.    escitalopram 20 MG tablet Commonly known as: LEXAPRO Take 1 tablet (20 mg total) by mouth daily.   lidocaine-prilocaine cream Commonly known as: EMLA Apply to affected area once   lisinopril 20 MG tablet Commonly known as: ZESTRIL Take 20 mg by mouth daily.   LORazepam 0.5 MG tablet Commonly known as: ATIVAN Take 1 tablet (0.5 mg total) by mouth every 8 (eight) hours as needed for anxiety.   ondansetron 8 MG tablet Commonly known as: Zofran Take 1 tablet (8 mg total) by mouth every 8 (eight) hours as needed for nausea or vomiting.   oxyCODONE 5 MG immediate release tablet Commonly known as: Oxy IR/ROXICODONE Take 1 tablet (5 mg total) by mouth every 6 (six) hours as needed for severe pain.   pantoprazole 40 MG tablet Commonly known as: PROTONIX Take 2 tablets (80 mg total) by mouth daily. What changed: how much to take   prochlorperazine 10 MG tablet Commonly known as: COMPAZINE Take 1 tablet (10 mg total) by mouth every 6 (six) hours as needed for nausea or vomiting.        Allergies  Allergen Reactions   Misc. Sulfonamide Containing Compounds Rash and Itching   Phentermine     Became suicidal on the medication   Bee Venom Swelling   Peanut Allergen Powder-Dnfp Other (See Comments)    Migraine   Topiramate Other (See Comments)    dizziness   Septra [Sulfamethoxazole-Trimethoprim] Rash   Sulfa Antibiotics Rash    Consultations: None   Procedures/Studies: CT ABDOMEN PELVIS W CONTRAST  Result Date: 09/29/2022 CLINICAL DATA:  Immunotherapy, possible toxicity with colitis. Breast cancer. EXAM: CT ABDOMEN AND PELVIS WITH CONTRAST TECHNIQUE: Multidetector CT imaging of the abdomen and pelvis was performed using the standard protocol following bolus administration of intravenous contrast. RADIATION DOSE REDUCTION: This exam was performed according to the departmental dose-optimization program which includes automated exposure control, adjustment of the mA and/or  kV according to patient size and/or use of iterative reconstruction technique. CONTRAST:  OMNIPAQUE IOHEXOL 300 MG/ML  SOLN COMPARISON:  CT chest abdomen and pelvis 09/11/2022 FINDINGS: Lower chest: There are minimal patchy ground-glass opacities in the lung bases. Hepatobiliary: No focal liver abnormality is seen. No gallstones, gallbladder wall thickening, or biliary dilatation. Pancreas: Unremarkable. No pancreatic ductal dilatation or surrounding inflammatory changes. Spleen: Normal in size without focal abnormality. Adrenals/Urinary Tract: Adrenal glands are unremarkable. Kidneys are normal, without renal calculi, focal lesion, or hydronephrosis. Bladder is unremarkable. Stomach/Bowel: Stomach is within normal limits. Appendix appears normal. No evidence of bowel wall thickening, distention, or inflammatory changes. The colon is predominantly fluid-filled. There are few scattered sigmoid colon diverticula. Vascular/Lymphatic: No significant vascular findings are present. No enlarged abdominal or pelvic lymph nodes. Reproductive: Uterus and bilateral adnexa are unremarkable. Other: There is a small fat containing umbilical hernia. No abdominopelvic ascites. Musculoskeletal: No acute or significant osseous findings. IMPRESSION: 1. The colon is predominantly fluid-filled compatible with diarrheal illness. 2. Minimal sigmoid colon diverticulosis without evidence for diverticulitis. 3. Minimal patchy ground-glass opacities in the lung bases, likely infectious/inflammatory. Electronically Signed   By: Darliss Cheney M.D.   On: 09/29/2022 20:09   ECHOCARDIOGRAM COMPLETE  Result Date:  09/29/2022    ECHOCARDIOGRAM REPORT   Patient Name:   YARETZI COPPERSMITH Date of Exam: 09/29/2022 Medical Rec #:  [id number]     Height:       63.0 in Accession #:    [id number]    Weight:       324.0 lb Date of Birth:  06/25/1970     BSA:          2.374 m Patient Age:    51 years      BP:           92/59 mmHg Patient Gender: F              HR:           92 bpm. Exam Location:  Inpatient Procedure: 2D Echo, Color Doppler and Cardiac Doppler Indications:    Chemotherapy  History:        Patient has prior history of Echocardiogram examinations. Risk                 Factors:Dyslipidemia and Hypertension.  Sonographer:    Milda Smart Referring Phys: [id number] PRAVEENA IRUKU IMPRESSIONS  1. Left ventricular ejection fraction, by estimation, is 65 to 70%. The left ventricle has normal function. The left ventricle has no regional wall motion abnormalities. There is mild left ventricular hypertrophy. Left ventricular diastolic parameters were normal.  2. Right ventricular systolic function is normal. The right ventricular size is normal. Tricuspid regurgitation signal is inadequate for assessing PA pressure.  3. The mitral valve is normal in structure. No evidence of mitral valve regurgitation.  4. The aortic valve was not well visualized. Aortic valve regurgitation is not visualized. No aortic stenosis is present. FINDINGS  Left Ventricle: Left ventricular ejection fraction, by estimation, is 65 to 70%. The left ventricle has normal function. The left ventricle has no regional wall motion abnormalities. The left ventricular internal cavity size was small. There is mild left ventricular hypertrophy. Left ventricular diastolic parameters were normal. Right Ventricle: The right ventricular size is normal. No increase in right ventricular wall thickness. Right ventricular systolic function is normal. Tricuspid regurgitation signal is inadequate for assessing PA pressure. Left Atrium: Left atrial size was normal in size. Right Atrium: Right atrial size was normal in size. Pericardium: Trivial pericardial effusion is present. Presence of epicardial fat layer. Mitral Valve: The mitral valve is normal in structure. No evidence of mitral valve regurgitation. MV peak gradient, 10.4 mmHg. The mean mitral valve gradient is 3.0 mmHg. Tricuspid Valve: The tricuspid  valve is normal in structure. Tricuspid valve regurgitation is trivial. Aortic Valve: The aortic valve was not well visualized. Aortic valve regurgitation is not visualized. No aortic stenosis is present. Pulmonic Valve: The pulmonic valve was not well visualized. Pulmonic valve regurgitation is not visualized. Aorta: The aortic root is normal in size and structure. IAS/Shunts: The interatrial septum was not well visualized.  LEFT VENTRICLE PLAX 2D LVIDd:         3.20 cm   Diastology LVIDs:         2.40 cm   LV e' medial:    7.07 cm/s LV PW:         1.10 cm   LV E/e' medial:  6.7 LV IVS:        1.10 cm   LV e' lateral:   9.14 cm/s LVOT diam:     1.80 cm   LV E/e' lateral: 5.2 LV SV:  45 LV SV Index:   19 LVOT Area:     2.54 cm  RIGHT VENTRICLE RV S prime:     12.40 cm/s TAPSE (M-mode): 1.4 cm LEFT ATRIUM             Index        RIGHT ATRIUM          Index LA diam:        2.70 cm 1.14 cm/m   RA Area:     7.46 cm LA Vol (A2C):   42.7 ml 17.99 ml/m  RA Volume:   11.10 ml 4.68 ml/m LA Vol (A4C):   32.8 ml 13.82 ml/m LA Biplane Vol: 37.6 ml 15.84 ml/m  AORTIC VALVE LVOT Vmax:   113.00 cm/s LVOT Vmean:  87.000 cm/s LVOT VTI:    0.178 m  AORTA Ao Root diam: 2.80 cm Ao Asc diam:  2.90 cm MITRAL VALVE MV Area (PHT): 2.40 cm    SHUNTS MV Area VTI:   1.99 cm    Systemic VTI:  0.18 m MV Peak grad:  10.4 mmHg   Systemic Diam: 1.80 cm MV Mean grad:  3.0 mmHg MV Vmax:       1.61 m/s MV Vmean:      75.7 cm/s MV Decel Time: 316 msec MV E velocity: 47.60 cm/s MV A velocity: 68.10 cm/s MV E/A ratio:  0.70 Epifanio Lesches MD Electronically signed by Epifanio Lesches MD Signature Date/Time: 09/29/2022/3:44:17 PM    Final    CT CHEST ABDOMEN PELVIS W CONTRAST  Result Date: 09/12/2022 CLINICAL DATA:  Stage IV breast cancer. Status post chemotherapy. * Tracking Code: BO * EXAM: CT CHEST, ABDOMEN, AND PELVIS WITH CONTRAST TECHNIQUE: Multidetector CT imaging of the chest, abdomen and pelvis was performed  following the standard protocol during bolus administration of intravenous contrast. RADIATION DOSE REDUCTION: This exam was performed according to the departmental dose-optimization program which includes automated exposure control, adjustment of the mA and/or kV according to patient size and/or use of iterative reconstruction technique. CONTRAST:  OMNIPAQUE IOHEXOL 300 MG/ML  SOLN COMPARISON:  06/29/2022 PET.  10/27/2019 abdominopelvic CT. FINDINGS: Mild Degradation secondary to patient body habitus. CT CHEST FINDINGS Cardiovascular: Right Port-A-Cath tip low SVC. Normal aortic caliber. Mild cardiomegaly, without pericardial effusion. No central pulmonary embolism, on this non-dedicated study. Mediastinum/Nodes: Resolution of supraclavicular adenopathy. Decrease in size of left axillary nodes. Index 9 mm node on 22/2 measured 12 mm on the prior. No right axillary and no subpectoral adenopathy. No middle mediastinal or hilar adenopathy. Resolution of prevascular adenopathy. Borderline sized 8 mm high prevascular node on 14/2 measured 1.7 cm on the prior exam (when remeasured). Tiny hiatal hernia. No internal mammary adenopathy. Lungs/Pleura: No pleural fluid. Subtle areas of ill-defined clustered nodularity including in the superior segment left lower lobe on 68/5. Musculoskeletal: The left medial breast mass is decreased, with only linear density remaining at 2.3 x 1.2 cm today versus 3.0 x 1.8 cm on the prior. No acute osseous abnormality. CT ABDOMEN PELVIS FINDINGS Hepatobiliary: Probable mild hepatic steatosis. No suspicious liver lesion or biliary abnormality. Pancreas: Normal, without mass or ductal dilatation. Spleen: Normal in size, without focal abnormality. Adrenals/Urinary Tract: Normal adrenal glands. Normal kidneys, without hydronephrosis. Normal urinary bladder. Stomach/Bowel: Normal remainder of the stomach. Normal colon, appendix, and terminal ileum. Normal small bowel. Vascular/Lymphatic:  Aortic atherosclerosis. No abdominopelvic adenopathy. Reproductive: Normal uterus and adnexa. Other: No significant free fluid. No free intraperitoneal air. No evidence of omental or peritoneal  disease. Musculoskeletal: No acute osseous abnormality. IMPRESSION: 1. Marked response to therapy of left breast primary and thoracic/lower cervical nodal metastasis. 2. No new or progressive disease. 3. Mild degradation secondary to patient body habitus. 4. Hepatic steatosis. 5. Subtle clustered nodularity, including in the superior segment left lower lobe is likely related to minimal infection or inflammation of indeterminate acuity. Electronically Signed   By: Jeronimo Greaves M.D.   On: 09/12/2022 15:59   (Echo, Carotid, EGD, Colonoscopy, ERCP)    Subjective: Patient seen and examined.  Husband and daughter at the bedside.  She was on clear liquid diet since admission and eager to eat regular diet.  She tolerated regular diet without problem.  Denies any nausea or abdominal pain.  She had 3 small volume stools overnight.  Eager to go home.  She wants to go home and manage her symptoms at home. Patient had auto deaccessed Port-A-Cath, there is no evidence of extravasation or bleeding or subcutaneous collection.  Advised not to use it until follow-up at cancer center.   Discharge Exam: Vitals:   09/30/22 0510 09/30/22 0913  BP: 116/73 (!) 142/98  Pulse: 93 94  Resp: 16 20  Temp: 98.3 F (36.8 C) 98.5 F (36.9 C)  SpO2: 94% 94%   Vitals:   09/29/22 2355 09/30/22 0102 09/30/22 0510 09/30/22 0913  BP:  123/63 116/73 (!) 142/98  Pulse:  85 93 94  Resp:  14 16 20   Temp: 98.9 F (37.2 C) 98.7 F (37.1 C) 98.3 F (36.8 C) 98.5 F (36.9 C)  TempSrc: Oral Oral Oral Oral  SpO2:  100% 94% 94%  Weight:  (!) 148.2 kg    Height:        General: Pt is alert, awake, not in acute distress, pleasant interaction. Cardiovascular: RRR, S1/S2 +, no rubs, no gallops Respiratory: CTA bilaterally, no wheezing, no  rhonchi Right-sided chest wall Port-A-Cath, clean and dry. Abdominal: Soft, NT, ND, bowel sounds +, obese and pendulous. Extremities: no edema, no cyanosis    The results of significant diagnostics from this hospitalization (including imaging, microbiology, ancillary and laboratory) are listed below for reference.     Microbiology: Recent Results (from the past 240 hour(s))  Gastrointestinal Panel by PCR , Stool     Status: None   Collection Time: 09/29/22 12:27 PM   Specimen: STOOL  Result Value Ref Range Status   Campylobacter species NOT DETECTED NOT DETECTED Final   Plesimonas shigelloides NOT DETECTED NOT DETECTED Final   Salmonella species NOT DETECTED NOT DETECTED Final   Yersinia enterocolitica NOT DETECTED NOT DETECTED Final   Vibrio species NOT DETECTED NOT DETECTED Final   Vibrio cholerae NOT DETECTED NOT DETECTED Final   Enteroaggregative E coli (EAEC) NOT DETECTED NOT DETECTED Final   Enteropathogenic E coli (EPEC) NOT DETECTED NOT DETECTED Final   Enterotoxigenic E coli (ETEC) NOT DETECTED NOT DETECTED Final   Shiga like toxin producing E coli (STEC) NOT DETECTED NOT DETECTED Final   Shigella/Enteroinvasive E coli (EIEC) NOT DETECTED NOT DETECTED Final   Cryptosporidium NOT DETECTED NOT DETECTED Final   Cyclospora cayetanensis NOT DETECTED NOT DETECTED Final   Entamoeba histolytica NOT DETECTED NOT DETECTED Final   Giardia lamblia NOT DETECTED NOT DETECTED Final   Adenovirus F40/41 NOT DETECTED NOT DETECTED Final   Astrovirus NOT DETECTED NOT DETECTED Final   Norovirus GI/GII NOT DETECTED NOT DETECTED Final   Rotavirus A NOT DETECTED NOT DETECTED Final   Sapovirus (I, II, IV, and V) NOT DETECTED  NOT DETECTED Final    Comment: Performed at [facility], [address] [place], [place] [address]  C Difficile Quick Screen w PCR reflex     Status: None   Collection Time: 09/29/22 12:27 PM   Specimen: STOOL  Result Value Ref Range Status   C Diff antigen  NEGATIVE NEGATIVE Final   C Diff toxin NEGATIVE NEGATIVE Final   C Diff interpretation No C. difficile detected.  Final    Comment: Performed at [facility], [address] [place], [place] [address]  SARS Coronavirus 2 by RT PCR (hospital order, performed in [facility] hospital lab) *cepheid single result test* Anterior Nasal Swab     Status: None   Collection Time: 09/30/22 12:07 AM   Specimen: Anterior Nasal Swab  Result Value Ref Range Status   SARS Coronavirus 2 by RT PCR NEGATIVE NEGATIVE Final    Comment: (NOTE) SARS-CoV-2 target nucleic acids are NOT DETECTED.  The SARS-CoV-2 RNA is generally detectable in upper and lower respiratory specimens during the acute phase of infection. The lowest concentration of SARS-CoV-2 viral copies this assay can detect is 250 copies / mL. A negative result does not preclude SARS-CoV-2 infection and should not be used as the sole basis for treatment or other patient management decisions.  A negative result may occur with improper specimen collection / handling, submission of specimen other than nasopharyngeal swab, presence of viral mutation(s) within the areas targeted by this assay, and inadequate number of viral copies (<250 copies / mL). A negative result must be combined with clinical observations, patient history, and epidemiological information.  Fact Sheet for Patients:   RoadLapTop.co.za  Fact Sheet for Healthcare Providers: http://kim-miller.com/  This test is not yet approved or  cleared by the [place] FDA and has been authorized for detection and/or diagnosis of SARS-CoV-2 by FDA under an Emergency Use Authorization (EUA).  This EUA will remain in effect (meaning this test can be used) for the duration of the COVID-19 declaration under Section 564(b)(1) of the Act, 21 U.S.C. section 360bbb-3(b)(1), unless the authorization is terminated or revoked  sooner.  Performed at [facility], [address] [place], [place] [address]      Labs: BNP (last 3 results) No results for input(s): "BNP" in the last 8760 hours. Basic Metabolic Panel: Recent Labs  Lab 09/29/22 1227 09/30/22 0136  NA 138 137  K 3.5 3.6  CL 104 107  CO2 21* 21*  GLUCOSE 154* 110*  BUN 15 13  CREATININE 1.26* 1.04*  1.03*  CALCIUM 9.4 8.3*  MG 1.8  --    Liver Function Tests: Recent Labs  Lab 09/29/22 1227  AST 26  ALT 37  ALKPHOS 107  BILITOT 0.6  PROT 7.3  ALBUMIN 4.0   Recent Labs  Lab 09/29/22 1227  LIPASE 26   No results for input(s): "AMMONIA" in the last 168 hours. CBC: Recent Labs  Lab 09/29/22 1227 09/30/22 0136  WBC 26.1* 11.0*  NEUTROABS 22.5*  --   HGB 11.5* 8.9*  HCT 36.8 29.3*  MCV 83.6 84.9  PLT 251 212   Cardiac Enzymes: No results for input(s): "CKTOTAL", "CKMB", "CKMBINDEX", "TROPONINI" in the last 168 hours. BNP: Invalid input(s): "POCBNP" CBG: No results for input(s): "GLUCAP" in the last 168 hours. D-Dimer No results for input(s): "DDIMER" in the last 72 hours. Hgb A1c No results for input(s): "HGBA1C" in the last 72 hours. Lipid Profile No results for input(s): "CHOL", "HDL", "  LDLCALC", "TRIG", "CHOLHDL", "LDLDIRECT" in the last 72 hours. Thyroid function studies No results for input(s): "TSH", "T4TOTAL", "T3FREE", "THYROIDAB" in the last 72 hours.  Invalid input(s): "FREET3" Anemia work up No results for input(s): "VITAMINB12", "FOLATE", "FERRITIN", "TIBC", "IRON", "RETICCTPCT" in the last 72 hours. Urinalysis    Component Value Date/Time   COLORURINE YELLOW 09/29/2022 1227   APPEARANCEUR CLOUDY (A) 09/29/2022 1227   APPEARANCEUR Clear 07/30/2020 0954   LABSPEC 1.023 09/29/2022 1227   PHURINE 5.0 09/29/2022 1227   GLUCOSEU NEGATIVE 09/29/2022 1227   HGBUR SMALL (A) 09/29/2022 1227   BILIRUBINUR NEGATIVE 09/29/2022 1227   BILIRUBINUR Negative 07/30/2020 0954   KETONESUR  NEGATIVE 09/29/2022 1227   PROTEINUR 30 (A) 09/29/2022 1227   NITRITE NEGATIVE 09/29/2022 1227   LEUKOCYTESUR SMALL (A) 09/29/2022 1227   Sepsis Labs Recent Labs  Lab 09/29/22 1227 09/30/22 0136  WBC 26.1* 11.0*   Microbiology Recent Results (from the past 240 hour(s))  Gastrointestinal Panel by PCR , Stool     Status: None   Collection Time: 09/29/22 12:27 PM   Specimen: STOOL  Result Value Ref Range Status   Campylobacter species NOT DETECTED NOT DETECTED Final   Plesimonas shigelloides NOT DETECTED NOT DETECTED Final   Salmonella species NOT DETECTED NOT DETECTED Final   Yersinia enterocolitica NOT DETECTED NOT DETECTED Final   Vibrio species NOT DETECTED NOT DETECTED Final   Vibrio cholerae NOT DETECTED NOT DETECTED Final   Enteroaggregative E coli (EAEC) NOT DETECTED NOT DETECTED Final   Enteropathogenic E coli (EPEC) NOT DETECTED NOT DETECTED Final   Enterotoxigenic E coli (ETEC) NOT DETECTED NOT DETECTED Final   Shiga like toxin producing E coli (STEC) NOT DETECTED NOT DETECTED Final   Shigella/Enteroinvasive E coli (EIEC) NOT DETECTED NOT DETECTED Final   Cryptosporidium NOT DETECTED NOT DETECTED Final   Cyclospora cayetanensis NOT DETECTED NOT DETECTED Final   Entamoeba histolytica NOT DETECTED NOT DETECTED Final   Giardia lamblia NOT DETECTED NOT DETECTED Final   Adenovirus F40/41 NOT DETECTED NOT DETECTED Final   Astrovirus NOT DETECTED NOT DETECTED Final   Norovirus GI/GII NOT DETECTED NOT DETECTED Final   Rotavirus A NOT DETECTED NOT DETECTED Final   Sapovirus (I, II, IV, and V) NOT DETECTED NOT DETECTED Final    Comment: Performed at [facility], [address] [place], [place] [address]  C Difficile Quick Screen w PCR reflex     Status: None   Collection Time: 09/29/22 12:27 PM   Specimen: STOOL  Result Value Ref Range Status   C Diff antigen NEGATIVE NEGATIVE Final   C Diff toxin NEGATIVE NEGATIVE Final   C Diff interpretation No C. difficile  detected.  Final    Comment: Performed at [facility], [address], [place], [place] [address]  SARS Coronavirus 2 by RT PCR (hospital order, performed in [facility] hospital lab) *cepheid single result test* Anterior Nasal Swab     Status: None   Collection Time: 09/30/22 12:07 AM   Specimen: Anterior Nasal Swab  Result Value Ref Range Status   SARS Coronavirus 2 by RT PCR NEGATIVE NEGATIVE Final    Comment: (NOTE) SARS-CoV-2 target nucleic acids are NOT DETECTED.  The SARS-CoV-2 RNA is generally detectable in upper and lower respiratory specimens during the acute phase of infection. The lowest concentration of SARS-CoV-2 viral copies this assay can detect is 250 copies / mL. A negative result does not preclude SARS-CoV-2 infection and should not be used as the  sole basis for treatment or other patient management decisions.  A negative result may occur with improper specimen collection / handling, submission of specimen other than nasopharyngeal swab, presence of viral mutation(s) within the areas targeted by this assay, and inadequate number of viral copies (<250 copies / mL). A negative result must be combined with clinical observations, patient history, and epidemiological information.  Fact Sheet for Patients:   RoadLapTop.co.za  Fact Sheet for Healthcare Providers: http://kim-miller.com/  This test is not yet approved or  cleared by the [place] FDA and has been authorized for detection and/or diagnosis of SARS-CoV-2 by FDA under an Emergency Use Authorization (EUA).  This EUA will remain in effect (meaning this test can be used) for the duration of the COVID-19 declaration under Section 564(b)(1) of the Act, 21 U.S.C. section 360bbb-3(b)(1), unless the authorization is terminated or revoked sooner.  Performed at [facility], [address], [place], [place] [address]      Time  coordinating discharge: 35 minutes  SIGNED:   Dorcas Carrow, MD  [facility] 09/30/2022, 11:01 AM

## 2022-09-30 NOTE — Progress Notes (Signed)
  Transition of Care [facility]) Screening Note   Patient Details  Name: Diamond Marshall Date of Birth: [birth year]   Transition of [facility]) CM/SW Contact:    Adrian Prows, RN Phone Number: 09/30/2022, 1:44 PM    Transition of Care Department [facility]) has reviewed patient and no TOC needs have been identified at this time. We will continue to monitor patient advancement through interdisciplinary progression rounds. If new patient transition needs arise, please place a TOC consult.

## 2022-09-30 NOTE — Progress Notes (Signed)
IV Therapy received a consult regarding the patient's portacath; suggest placing a warm pack to assist with swelling over the area, and then attempting to re-access port again; if no blood return, TPA can be instilled;  have contacted the Cancer center to see if they carry longer huber needles, but they do not;  Pt has a PIV at this time;  RN caring for pt will attempt to reaccess later to flush with NS and 500 units of Heparin if the pt is to be discharged.

## 2022-09-30 NOTE — Progress Notes (Signed)
Patient accidentally pulled out her port needle by rolling over and it partially remained in her skin leaking fluid around port-site until she noticed. We attempted re-access but were unsuccessful d/t fluid and swelling. There was no blood return.  Shayna,RN inserted a peripheral IV per patient request.  Will notify IV team to evaluate site in am.Sudais Banghart, Larey Dresser

## 2022-10-01 LAB — CULTURE, BLOOD (ROUTINE X 2): Culture: NO GROWTH

## 2022-10-02 ENCOUNTER — Telehealth: Payer: Self-pay

## 2022-10-02 ENCOUNTER — Other Ambulatory Visit: Payer: Self-pay

## 2022-10-02 ENCOUNTER — Telehealth: Payer: Self-pay | Admitting: *Deleted

## 2022-10-02 LAB — CULTURE, BLOOD (ROUTINE X 2): Special Requests: ADEQUATE

## 2022-10-02 MED FILL — Dexamethasone Sodium Phosphate Inj 100 MG/10ML: INTRAMUSCULAR | Qty: 1 | Status: AC

## 2022-10-02 NOTE — Telephone Encounter (Signed)
VM left by pt stating she was kept overnight post episode occurring in vascular lab- with severe nausea/vomiting and diarrhea.  She is asking of she will be treated tomorrow as planned- as she needs to start her steroid premed this evening.  Pt is scheduled for lab/visit with [facility] and then treatment.  This RN discussed recent issue and possible need to hold the perjeta but that she should take her steroids in anticipation of taxotere.  Note pt is very motivated and wanting to be fully treated - even with the perjeta.  Destina states episode on Friday "was not like my usual chemo side effects but more like an acute gastritis- "  She states she would rather get the perjeta "since I am having such a good response would like to finish it out- even if I have to come in next week for IVFs"  Presently pt is recovered and not having any issues. This note will be forwarded to MD and Mardella Layman for review per planned treatment tomorrow.

## 2022-10-02 NOTE — Transitions of Care (Post Inpatient/ED Visit) (Signed)
10/02/2022  Name: Diamond Marshall MRN: [id number] DOB: Mar 18, 1971  Today's TOC FU Call Status: Today's TOC FU Call Status:: Successful TOC FU Call Competed TOC FU Call Complete Date: 10/02/22  Transition Care Management Follow-up Telephone Call Date of Discharge: 09/30/22 Discharge Facility: [facility]) Type of Discharge: Inpatient Admission Primary Inpatient Discharge Diagnosis:: neoplasm left breast How have you been since you were released from the hospital?: Better Any questions or concerns?: No  Items Reviewed: Did you receive and understand the discharge instructions provided?: Yes Medications obtained,verified, and reconciled?: Yes (Medications Reviewed) Any new allergies since your discharge?: No Dietary orders reviewed?: Yes Do you have support at home?: Yes People in Home: spouse  Medications Reviewed Today: Medications Reviewed Today     Reviewed by Karena Addison, LPN (Licensed Practical Nurse) on 10/02/22 at 1451  Med List Status: <None>   Medication Order Taking? Sig Documenting Provider Last Dose Status Informant  cholestyramine (QUESTRAN) 4 g packet [id number] Yes Take 1 packet (4 g total) by mouth 3 (three) times daily with meals.  Patient taking differently: Take 4 g by mouth 3 (three) times daily as needed (diarrhea).   Shanon Ace, PA-C Taking Active Self  cyanocobalamin (,VITAMIN B-12,) 1000 MCG/ML injection [id number] Yes INJECT 1 ML INTRAMUSCULARLY EVERY 15 DAYS  Patient taking differently: Inject 1,000 mcg into the muscle every 30 (thirty) days.   Mechele Claude, MD Taking Active Self  dexamethasone (DECADRON) 4 MG tablet [id number] No Take 2 tabs by mouth 2 times daily starting day before chemo. Then take 2 tabs daily for 2 days starting day after chemo. Take with food.  Patient not taking: Reported on 07/04/2022   Rachel Moulds, MD Not Taking Active Self  diphenoxylate-atropine (LOMOTIL) 2.5-0.025 MG tablet [id number] Yes TAKE TWO  TABLETS EVERY 6 HOURS AS NEEDED FOR DIARRHEA Shanon Ace, PA-C Taking Active Self  EPINEPHrine 0.3 mg/0.3 mL IJ SOAJ injection [id number] Yes Inject 0.3 mg into the muscle as needed for anaphylaxis. Gwenlyn Fudge, FNP Taking Active Self  escitalopram (LEXAPRO) 20 MG tablet [id number] Yes Take 1 tablet (20 mg total) by mouth daily. Mechele Claude, MD Taking Active Self  lidocaine-prilocaine (EMLA) cream [id number] No Apply to affected area once  Patient not taking: Reported on 07/04/2022   Rachel Moulds, MD Not Taking Active Self  lisinopril (ZESTRIL) 20 MG tablet [id number] Yes Take 20 mg by mouth daily. [provider] Taking Active Self  LORazepam (ATIVAN) 0.5 MG tablet [id number] Yes Take 1 tablet (0.5 mg total) by mouth every 8 (eight) hours as needed for anxiety. Rachel Moulds, MD Taking Active Self  ondansetron (ZOFRAN) 8 MG tablet [id number] No Take 1 tablet (8 mg total) by mouth every 8 (eight) hours as needed for nausea or vomiting.  Patient not taking: Reported on 07/04/2022   Rachel Moulds, MD Not Taking Active Self  oxyCODONE (OXY IR/ROXICODONE) 5 MG immediate release tablet [id number] No Take 1 tablet (5 mg total) by mouth every 6 (six) hours as needed for severe pain.  Patient not taking: Reported on 09/29/2022   Harriette Bouillon, MD Not Taking Active Self  pantoprazole (PROTONIX) 40 MG tablet [id number] Yes Take 2 tablets (80 mg total) by mouth daily.  Patient taking differently: Take 40 mg by mouth daily.   Mechele Claude, MD Taking Active Self  prochlorperazine (COMPAZINE) 10 MG tablet [id number] No Take 1 tablet (10 mg total) by mouth every 6 (six) hours as needed for nausea or vomiting.  Patient not taking:  Reported on 07/04/2022   Rachel Moulds, MD Not Taking Active Self            Home Care and Equipment/Supplies: Were Home Health Services Ordered?: NA Any new equipment or medical supplies ordered?: NA  Functional Questionnaire: Do you need  assistance with bathing/showering or dressing?: No Do you need assistance with meal preparation?: No Do you need assistance with eating?: No Do you have difficulty maintaining continence: No Do you need assistance with getting out of bed/getting out of a chair/moving?: No Do you have difficulty managing or taking your medications?: No  Follow up appointments reviewed: PCP Follow-up appointment confirmed?: No (declined appt) MD Provider Line Number:[phone] Given: No Specialist Hospital Follow-up appointment confirmed?: Yes Date of Specialist follow-up appointment?: 10/03/22 Follow-Up Specialty Provider:: oncology Do you need transportation to your follow-up appointment?: No Do you understand care options if your condition(s) worsen?: Yes-patient verbalized understanding    SIGNATURE Karena Addison, LPN [facility] Nurse Health Advisor Direct Dial [phone]

## 2022-10-03 ENCOUNTER — Inpatient Hospital Stay: Payer: Commercial Managed Care - PPO

## 2022-10-03 ENCOUNTER — Encounter: Payer: Self-pay | Admitting: Adult Health

## 2022-10-03 ENCOUNTER — Inpatient Hospital Stay: Payer: Commercial Managed Care - PPO | Attending: Hematology and Oncology

## 2022-10-03 ENCOUNTER — Inpatient Hospital Stay (HOSPITAL_BASED_OUTPATIENT_CLINIC_OR_DEPARTMENT_OTHER): Payer: Commercial Managed Care - PPO | Admitting: Adult Health

## 2022-10-03 VITALS — BP 155/72 | HR 103 | Temp 97.8°F | Resp 16 | Wt 328.7 lb

## 2022-10-03 VITALS — HR 99

## 2022-10-03 DIAGNOSIS — I10 Essential (primary) hypertension: Secondary | ICD-10-CM | POA: Insufficient documentation

## 2022-10-03 DIAGNOSIS — Z5112 Encounter for antineoplastic immunotherapy: Secondary | ICD-10-CM | POA: Diagnosis present

## 2022-10-03 DIAGNOSIS — C50212 Malignant neoplasm of upper-inner quadrant of left female breast: Secondary | ICD-10-CM | POA: Diagnosis not present

## 2022-10-03 DIAGNOSIS — M25561 Pain in right knee: Secondary | ICD-10-CM | POA: Diagnosis not present

## 2022-10-03 DIAGNOSIS — R5383 Other fatigue: Secondary | ICD-10-CM | POA: Insufficient documentation

## 2022-10-03 DIAGNOSIS — Z170 Estrogen receptor positive status [ER+]: Secondary | ICD-10-CM

## 2022-10-03 DIAGNOSIS — Z5189 Encounter for other specified aftercare: Secondary | ICD-10-CM | POA: Insufficient documentation

## 2022-10-03 DIAGNOSIS — Z5111 Encounter for antineoplastic chemotherapy: Secondary | ICD-10-CM | POA: Insufficient documentation

## 2022-10-03 DIAGNOSIS — R21 Rash and other nonspecific skin eruption: Secondary | ICD-10-CM | POA: Insufficient documentation

## 2022-10-03 DIAGNOSIS — G8929 Other chronic pain: Secondary | ICD-10-CM | POA: Diagnosis not present

## 2022-10-03 DIAGNOSIS — Z95828 Presence of other vascular implants and grafts: Secondary | ICD-10-CM

## 2022-10-03 LAB — CBC WITH DIFFERENTIAL (CANCER CENTER ONLY)
Abs Immature Granulocytes: 0.11 10*3/uL — ABNORMAL HIGH (ref 0.00–0.07)
Basophils Absolute: 0 10*3/uL (ref 0.0–0.1)
Basophils Relative: 0 %
Eosinophils Absolute: 0 10*3/uL (ref 0.0–0.5)
Eosinophils Relative: 0 %
HCT: 30.6 % — ABNORMAL LOW (ref 36.0–46.0)
Hemoglobin: 10 g/dL — ABNORMAL LOW (ref 12.0–15.0)
Immature Granulocytes: 1 %
Lymphocytes Relative: 6 %
Lymphs Abs: 0.6 10*3/uL — ABNORMAL LOW (ref 0.7–4.0)
MCH: 26.5 pg (ref 26.0–34.0)
MCHC: 32.7 g/dL (ref 30.0–36.0)
MCV: 81 fL (ref 80.0–100.0)
Monocytes Absolute: 0.2 10*3/uL (ref 0.1–1.0)
Monocytes Relative: 3 %
Neutro Abs: 8.7 10*3/uL — ABNORMAL HIGH (ref 1.7–7.7)
Neutrophils Relative %: 90 %
Platelet Count: 359 10*3/uL (ref 150–400)
RBC: 3.78 MIL/uL — ABNORMAL LOW (ref 3.87–5.11)
RDW: 20.5 % — ABNORMAL HIGH (ref 11.5–15.5)
WBC Count: 9.6 10*3/uL (ref 4.0–10.5)
nRBC: 0.2 % (ref 0.0–0.2)

## 2022-10-03 LAB — CMP (CANCER CENTER ONLY)
ALT: 23 U/L (ref 0–44)
AST: 17 U/L (ref 15–41)
Albumin: 3.8 g/dL (ref 3.5–5.0)
Alkaline Phosphatase: 89 U/L (ref 38–126)
Anion gap: 9 (ref 5–15)
BUN: 8 mg/dL (ref 6–20)
CO2: 25 mmol/L (ref 22–32)
Calcium: 8.9 mg/dL (ref 8.9–10.3)
Chloride: 107 mmol/L (ref 98–111)
Creatinine: 0.77 mg/dL (ref 0.44–1.00)
GFR, Estimated: >60 mL/min (ref >60)
Glucose, Bld: 150 mg/dL — ABNORMAL HIGH (ref 70–99)
Potassium: 3.7 mmol/L (ref 3.5–5.1)
Sodium: 141 mmol/L (ref 135–145)
Total Bilirubin: 0.4 mg/dL (ref 0.3–1.2)
Total Protein: 6.6 g/dL (ref 6.5–8.1)

## 2022-10-03 LAB — CULTURE, BLOOD (ROUTINE X 2)

## 2022-10-03 MED ORDER — SODIUM CHLORIDE 0.9 % IV SOLN
60.0000 mg/m2 | Freq: Once | INTRAVENOUS | Status: AC
  Administered 2022-10-03: 160 mg via INTRAVENOUS
  Filled 2022-10-03: qty 16

## 2022-10-03 MED ORDER — SODIUM CHLORIDE 0.9 % IV SOLN: Freq: Once | INTRAVENOUS | Status: AC

## 2022-10-03 MED ORDER — DIPHENHYDRAMINE HCL 25 MG PO CAPS
50.0000 mg | ORAL_CAPSULE | Freq: Once | ORAL | Status: AC
  Administered 2022-10-03: 50 mg via ORAL
  Filled 2022-10-03: qty 2

## 2022-10-03 MED ORDER — SODIUM CHLORIDE 0.9 % IV SOLN
420.0000 mg | Freq: Once | INTRAVENOUS | Status: AC
  Administered 2022-10-03: 420 mg via INTRAVENOUS
  Filled 2022-10-03: qty 14

## 2022-10-03 MED ORDER — SODIUM CHLORIDE 0.9 % IV SOLN
10.0000 mg | Freq: Once | INTRAVENOUS | Status: AC
  Administered 2022-10-03: 10 mg via INTRAVENOUS
  Filled 2022-10-03: qty 10

## 2022-10-03 MED ORDER — HEPARIN SOD (PORK) LOCK FLUSH 100 UNIT/ML IV SOLN: 500.0000 [IU] | Freq: Once | INTRAVENOUS | Status: DC | PRN

## 2022-10-03 MED ORDER — DIPHENOXYLATE-ATROPINE 2.5-0.025 MG PO TABS
ORAL_TABLET | ORAL | 1 refills | Status: DC
Start: 2022-10-03 — End: 2023-02-26

## 2022-10-03 MED ORDER — SODIUM CHLORIDE 0.9% FLUSH
10.0000 mL | Freq: Once | INTRAVENOUS | Status: AC
  Administered 2022-10-03: 10 mL

## 2022-10-03 MED ORDER — TRASTUZUMAB-ANNS CHEMO 150 MG IV SOLR
6.0000 mg/kg | Freq: Once | INTRAVENOUS | Status: AC
  Administered 2022-10-03: 840 mg via INTRAVENOUS
  Filled 2022-10-03: qty 40

## 2022-10-03 MED ORDER — SODIUM CHLORIDE 0.9% FLUSH: 10.0000 mL | INTRAVENOUS | Status: DC | PRN

## 2022-10-03 MED ORDER — ACETAMINOPHEN 325 MG PO TABS
650.0000 mg | ORAL_TABLET | Freq: Once | ORAL | Status: AC
  Administered 2022-10-03: 650 mg via ORAL
  Filled 2022-10-03: qty 2

## 2022-10-03 NOTE — Progress Notes (Signed)
[facility] Cancer Follow up:    Diamond Claude, MD [address] [place] [address]   DIAGNOSIS:  Cancer Staging  Malignant neoplasm of upper-inner quadrant of left breast in female, estrogen receptor positive (HCC) Staging form: Breast, AJCC 8th Edition - Clinical stage from 06/21/2022: Stage IIIB (cT3, cN3, cM0, G3, ER+, PR+, HER2+) - Unsigned Stage prefix: Initial diagnosis Histologic grading system: 3 grade system Stage used in treatment planning: Yes National guidelines used in treatment planning: Yes Type of national guideline used in treatment planning: NCCN   SUMMARY OF ONCOLOGIC HISTORY: Oncology History  Malignant neoplasm of upper-inner quadrant of left breast in female, estrogen receptor positive (HCC)  06/06/2022 Mammogram   Diagnostic mammogram given palpable mass showed suspicious spiculated left breast mass with surrounding nodularity at 9 o'clock position.  This measures up to 3.1 cm sonographically however due to deep location and patient body habitus the mammographic measurement of 5.8 cm is felt to be more accurate.  At least 2 abnormal lymph nodes in the left axilla.  Possible abnormal palpable left supraclavicular lymph node.   06/06/2022 Breast US   Breast ultrasound confirmed these findings.   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  Prognostic showed ER 65% weak to strong staining, PR 15% weak to moderate staining, Ki-67 of 50% and HER2 positive by IHC at 3+    Genetic Testing   [facility] Custom Panel+RNA was Negative. Report date is 06/28/2022.   The Custom Hereditary Cancers Panel offered by [facility] includes sequencing and/or deletion duplication testing of the following 43 genes: APC, ATM, AXIN2, BAP1, BARD1, BMPR1A, BRCA1, BRCA2, BRIP1, CDH1, CDK4, CDKN2A (p14ARF and  p16INK4a only), CHEK2, CTNNA1, EPCAM (Deletion/duplication testing only), FH, GREM1 (promoter region duplication testing only), HOXB13, KIT, MBD4, MEN1, MLH1, MSH2, MSH3, MSH6, MUTYH, NF1, NHTL1, PALB2, PDGFRA, PMS2, POLD1, POLE, PTEN, RAD51C, RAD51D, SMAD4, SMARCA4. STK11, TP53, TSC1, TSC2, and VHL.    06/29/2022 PET scan   IMPRESSION: 1. Hypermetabolic left breast mass, compatible with primary breast malignancy. 2. Hypermetabolic left cervical, axillary, subpectoral, supraclavicular and prevascular mediastinal lymph nodes, compatible with nodal metastatic disease. 3. No evidence of metastatic disease in the abdomen or pelvis. 4. Aortic Atherosclerosis (ICD10-I70.0).     Electronically Signed   By: Allegra Lai M.D.   On: 06/29/2022 09:32   07/11/2022 -  Chemotherapy   Patient is on Treatment Plan : BREAST DOCEtaxel + Trastuzumab + Pertuzumab (THP) q21d x 8 cycles / Trastuzumab + Pertuzumab q21d x 4 cycles       CURRENT THERAPY: Taxotere/Herceptin/Perjeta  INTERVAL HISTORY: Diamond Marshall 52 y.o. female returns for follow-up prior to receiving her fifth cycle of Taxotere, Herceptin, Perjeta.  Diamond Marshall was admitted to the hospital from 09/29/2022 through 09/30/2022.  She noted that Friday morning 09/29/2022 she woke up and began experiencing watery diarrhea.  She said that this happened multiple times, and occurred for five minutes at a time and then recurred.  She said she took lomotil 2 tabs after 5 episodes of this and then went to her echo appt.  She became nauseated on her way to the hospital.  She had diarrhea while at admissions and began vomiting and had diarrhea on herself and she was sent to the ER.  She then proceed to ER, underwent CT A/P that showed fluid filled colon  compatible with diarrheal illness.  She received aggressive IV hydration and abx.  She believes this was out of proportion compared to previous treatment side effects and believes it was viral gastroenteritis since  it resolved so quickly.    She tells me that she is drinking well and hydrates with water in addition to liquid IV.  She notes that over the past week she has had a small rash on her abdomen that is not itching.  It popped up about a week ago and is not worsening and is on both sides of her abdomen.  Patient Active Problem List   Diagnosis Date Noted   Gastroenteritis 09/29/2022   Port-A-Cath in place 07/07/2022   Genetic testing 06/29/2022   Malignant neoplasm of upper-inner quadrant of left breast in female, estrogen receptor positive (HCC) 06/19/2022   Chronic pain of right knee 03/27/2022   Essential hypertension 03/27/2022   Positive self-administered antigen test for COVID-19 03/15/2021   Prediabetes 07/22/2019   Anxiety and depression 03/15/2016   Vitamin D deficiency 03/15/2016   Metabolic syndrome X 08/07/2013   Elevated random blood glucose level 08/07/2013   Hypertriglyceridemia 08/07/2013   ADD (attention deficit disorder) 07/24/2013    hyperlipidemia 10/09/2012   Morbid obesity (HCC) 10/09/2012   Hypothyroidism 10/09/2012   B12 deficiency 10/09/2012   Anemia, unspecified 02/12/2011   Dysthymic disorder 02/12/2011   Migraine headache 02/12/2011   Obsessive-compulsive disorder 02/12/2011   Tinnitus 02/12/2011    is allergic to misc. sulfonamide containing compounds, phentermine, bee venom, peanut allergen powder-dnfp, topiramate, septra [sulfamethoxazole-trimethoprim], and sulfa antibiotics.  MEDICAL HISTORY: Past Medical History:  Diagnosis Date   Abdominal hernia    Anemia    b12 deficiency   Anxiety    Arthritis    B12 deficiency    Back pain    Cancer (HCC)    breast cancer   Depression    Family history of adverse reaction to anesthesia    mother vomits after anesthesia   Gallbladder problem    GERD (gastroesophageal reflux disease)    History of hiatal hernia    "small" per patient   Hypertension    Hypothyroidism    Joint pain    Knee pain     Palpitation    "when iron is low"   Pernicious anemia    SOB (shortness of breath)    Thyroid disease    hypothyroidism   Vitamin D deficiency     SURGICAL HISTORY: Past Surgical History:  Procedure Laterality Date   BREAST BIOPSY Left 06/09/2022   [place] LT BREAST BX W LOC DEV 1ST LESION IMG BX SPEC US GUIDE 06/09/2022 [facility] MAMMOGRAPHY   CESAREAN SECTION  1999   Dilatation and currettagement     for miscarriage 18 years ago   PORTACATH PLACEMENT Right 07/05/2022   Procedure: INSERTION PORT-A-CATH;  Surgeon: Harriette Bouillon, MD;  Location: [facility] OR;  Service: General;  Laterality: Right;    SOCIAL HISTORY: Social History   Socioeconomic History   Marital status: Married    Spouse name: Kathlene November   Number of children: Not on file   Years of education: Not on file   Highest education level: Not on file  Occupational History   Occupation: [employer] copy  Tobacco Use   Smoking status: Never   Smokeless tobacco: Never  Vaping Use   Vaping Use: Never used  Substance and Sexual Activity   Alcohol use: No   Drug use: No   Sexual activity: Yes  Birth control/protection: Pill  Other Topics Concern   Not on file  Social History Narrative   Not on file   Social Determinants of Health   Financial Resource Strain: Not on file  Food Insecurity: No Food Insecurity (09/30/2022)   Hunger Vital Sign    Worried About Running Out of Food in the Last Year: Never true    Ran Out of Food in the Last Year: Never true  Transportation Needs: No Transportation Needs (09/30/2022)   PRAPARE - Administrator, Civil Service (Medical): No    Lack of Transportation (Non-Medical): No  Physical Activity: Not on file  Stress: Not on file  Social Connections: Not on file  Intimate Partner Violence: Not At Risk (09/30/2022)   Humiliation, Afraid, Rape, and Kick questionnaire    Fear of Current or Ex-Partner: No    Emotionally Abused: No    Physically Abused: No    Sexually Abused:  No    FAMILY HISTORY: Family History  Problem Relation Age of Onset   Hypertension Mother    Diabetes Mother    High Cholesterol Mother    Thyroid disease Mother    Depression Mother    Anxiety disorder Mother    Obesity Mother    CVA Father        hemorrhagic   Ovarian cancer Maternal Aunt 60   Colon cancer Maternal Aunt    Breast cancer Cousin 11       maternal first cousin, reports negative genetic testing    Review of Systems  Constitutional:  Positive for fatigue. Negative for appetite change, chills, fever and unexpected weight change.  HENT:   Negative for hearing loss, lump/mass and trouble swallowing.   Eyes:  Negative for eye problems and icterus.  Respiratory:  Negative for chest tightness, cough and shortness of breath.   Cardiovascular:  Negative for chest pain, leg swelling and palpitations.  Gastrointestinal:  Negative for abdominal distention, abdominal pain, constipation, diarrhea, nausea and vomiting.  Endocrine: Negative for hot flashes.  Genitourinary:  Negative for difficulty urinating.   Musculoskeletal:  Negative for arthralgias.  Skin:  Negative for itching and rash.  Neurological:  Negative for dizziness, extremity weakness, headaches and numbness.  Hematological:  Negative for adenopathy. Does not bruise/bleed easily.  Psychiatric/Behavioral:  Negative for depression. The patient is not nervous/anxious.       PHYSICAL EXAMINATION   Onc Performance Status - 10/03/22 1218       ECOG Perf Status   ECOG Perf Status Restricted in physically strenuous activity but ambulatory and able to carry out work of a light or sedentary nature, e.g., light house work, office work      KPS SCALE   KPS % SCORE Able to carry on normal activity, minor s/s of disease             Vitals:   10/03/22 1218  BP: (!) 155/72  Pulse: (!) 103  Resp: 16  Temp: 97.8 F (36.6 C)  SpO2: 96%    Physical Exam Constitutional:      General: She is not in acute  distress.    Appearance: Normal appearance. She is not toxic-appearing.  HENT:     Head: Normocephalic and atraumatic.  Eyes:     General: No scleral icterus. Cardiovascular:     Rate and Rhythm: Normal rate and regular rhythm.     Pulses: Normal pulses.     Heart sounds: Normal heart sounds.  Pulmonary:  Effort: Pulmonary effort is normal.     Breath sounds: Normal breath sounds.  Abdominal:     General: Abdomen is flat. Bowel sounds are normal. There is no distension.     Palpations: Abdomen is soft.     Tenderness: There is no abdominal tenderness.  Musculoskeletal:        General: No swelling.     Cervical back: Neck supple.  Lymphadenopathy:     Cervical: No cervical adenopathy.  Skin:    General: Skin is warm and dry.     Findings: Rash present.     Comments: Circular erythematous rash on abdomen, in small circles, well circumscribed  Neurological:     General: No focal deficit present.     Mental Status: She is alert.  Psychiatric:        Mood and Affect: Mood normal.        Behavior: Behavior normal.     LABORATORY DATA:  None for this visit   ASSESSMENT and THERAPY PLAN:   Malignant neoplasm of upper-inner quadrant of left breast in female, estrogen receptor positive (HCC) Diamond Marshall is here for follow-up and evaluation prior to receiving her fifth cycle of Taxotere Herceptin and Perjeta.  We reviewed her labs and she will proceed with treatment today.   She had quite the difficulty with the diarrhea nausea and vomiting.  She believes since this happened so quickly but lasted so briefly it is more consistent with a viral gastroenteritis then with her typical reactions with diarrhea to her treatment.  We set the following up due to this most recent incident.   She will take cholestyramine at least daily while her bowels are loose and can increase to twice a day if they become diarrhea. She was recommended to take 2 Imodium at her first diarrhea episode and then  to alternate between Lomotil and Imodium after that. IV fluids have been ordered for Thursday along with 1 week from now.  She notes a mild rash on her abdomen.  This is not itching.  I let her know that if it gets worse we will need to her in a Medrol Dosepak along with hold one of her treatments the next time.  This is the same if her diarrhea worsens.  RTC as noted above, and again in 3 weeks for labs, f/u and cycle 6 of therapy.    All questions were answered. The patient knows to call the clinic with any problems, questions or concerns. We can certainly see the patient much sooner if necessary.  Total encounter time:30 minutes*in face-to-face visit time, chart review, lab review, care coordination, order entry, and documentation of the encounter time.    Lillard Anes, NP 10/03/22 2:50 PM Medical Oncology and Hematology [facility] [address] [place], [place] [address] Tel. [phone]    Fax. [phone]  *Total Encounter Time as defined by the Centers for Medicare and Medicaid Services includes, in addition to the face-to-face time of a patient visit (documented in the note above) non-face-to-face time: obtaining and reviewing outside history, ordering and reviewing medications, tests or procedures, care coordination (communications with other health care professionals or caregivers) and documentation in the medical record.

## 2022-10-03 NOTE — Assessment & Plan Note (Addendum)
Diamond Marshall is here for follow-up and evaluation prior to receiving her fifth cycle of Taxotere Herceptin and Perjeta.  We reviewed her labs and she will proceed with treatment today.   She had quite the difficulty with the diarrhea nausea and vomiting.  She believes since this happened so quickly but lasted so briefly it is more consistent with a viral gastroenteritis then with her typical reactions with diarrhea to her treatment.  We set the following up due to this most recent incident.   She will take cholestyramine at least daily while her bowels are loose and can increase to twice a day if they become diarrhea. She was recommended to take 2 Imodium at her first diarrhea episode and then to alternate between Lomotil and Imodium after that. IV fluids have been ordered for Thursday along with 1 week from now.  She notes a mild rash on her abdomen.  This is not itching.  I let her know that if it gets worse we will need to her in a Medrol Dosepak along with hold one of her treatments the next time.  This is the same if her diarrhea worsens.  RTC as noted above, and again in 3 weeks for labs, f/u and cycle 6 of therapy.

## 2022-10-04 ENCOUNTER — Telehealth: Payer: Self-pay | Admitting: Adult Health

## 2022-10-04 LAB — CULTURE, BLOOD (ROUTINE X 2)
Culture: NO GROWTH
Special Requests: ADEQUATE

## 2022-10-04 NOTE — Telephone Encounter (Signed)
Scheduled and cancelled appointments per 5/14 los. Left voicemail.

## 2022-10-05 ENCOUNTER — Inpatient Hospital Stay: Payer: Commercial Managed Care - PPO

## 2022-10-05 ENCOUNTER — Encounter: Payer: Self-pay | Admitting: Hematology and Oncology

## 2022-10-05 ENCOUNTER — Other Ambulatory Visit: Payer: Self-pay

## 2022-10-05 VITALS — BP 144/87 | HR 85 | Temp 98.5°F | Resp 15

## 2022-10-05 DIAGNOSIS — Z5112 Encounter for antineoplastic immunotherapy: Secondary | ICD-10-CM | POA: Diagnosis not present

## 2022-10-05 DIAGNOSIS — Z95828 Presence of other vascular implants and grafts: Secondary | ICD-10-CM

## 2022-10-05 DIAGNOSIS — C50212 Malignant neoplasm of upper-inner quadrant of left female breast: Secondary | ICD-10-CM

## 2022-10-05 MED ORDER — PEGFILGRASTIM-CBQV 6 MG/0.6ML ~~LOC~~ SOSY
6.0000 mg | PREFILLED_SYRINGE | Freq: Once | SUBCUTANEOUS | Status: AC
  Administered 2022-10-05: 6 mg via SUBCUTANEOUS
  Filled 2022-10-05: qty 0.6

## 2022-10-05 MED ORDER — SODIUM CHLORIDE 0.9% FLUSH
10.0000 mL | Freq: Once | INTRAVENOUS | Status: AC
  Administered 2022-10-05: 10 mL

## 2022-10-05 MED ORDER — HEPARIN SOD (PORK) LOCK FLUSH 100 UNIT/ML IV SOLN
500.0000 [IU] | Freq: Once | INTRAVENOUS | Status: AC
  Administered 2022-10-05: 500 [IU]

## 2022-10-05 MED ORDER — SODIUM CHLORIDE 0.9 % IV SOLN: Freq: Once | INTRAVENOUS | Status: AC

## 2022-10-05 NOTE — Patient Instructions (Signed)
Dehydration, Adult Dehydration is a condition in which there is not enough water or other fluids in the body. This happens when a person loses more fluids than they take in. Important organs, such as the kidneys, brain, and heart, cannot function without a proper amount of fluids. Any loss of fluids from the body can lead to dehydration. Dehydration can be mild, moderate, or severe. It should be treated right away to prevent it from becoming severe. What are the causes? Dehydration may be caused by: Health conditions, such as diarrhea, vomiting, fever, infection, or sweating or urinating a lot. Not drinking enough fluids. Certain medicines, such as medicines that remove excess fluid from the body (diuretics). Lack of safe drinking water. Not being able to get enough water and food. What increases the risk? The following factors may make you more likely to develop this condition: Having a long-term (chronic) illness that has not been treated properly, such as diabetes, heart disease, or kidney disease. Being 65 years of age or older. Having a disability. Living in a place that is high in altitude, where thinner, drier air causes more fluid loss. Doing exercises that put stress on your body for a long time (endurance sports). Being active in a hot climate. What are the signs or symptoms? Symptoms of dehydration depend on how severe it is. Mild or moderate dehydration Thirst. Dry lips or dry mouth. Dizziness or light-headedness. Muscle cramps. Dark urine. Urine may be the color of tea. Less urine or tears produced than usual. Headache. Severe dehydration Changes in skin. Your skin may be cold and clammy, blotchy, or pale. Your skin also may not return to normal after being lightly pinched and released. Little or no tears, urine, or sweat. Rapid breathing and low blood pressure. Your pulse may be weak or may be faster than 100 beats per minute when you are sitting still. Other changes,  such as: Feeling very thirsty. Sunken eyes. Cold hands and feet. Confusion. Being very tired (lethargic) or having trouble waking from sleep. Short-term weight loss. Loss of consciousness. How is this diagnosed? This condition is diagnosed based on your symptoms and a physical exam. You may have blood and urine tests to help confirm the diagnosis. How is this treated? Treatment for this condition depends on how severe it is. Treatment should be started right away. Do not wait until dehydration becomes severe. Severe dehydration is an emergency and needs to be treated in a hospital. Mild or moderate dehydration can be treated at home. You may be asked to: Drink more fluids. Drink an oral rehydration solution (ORS). This drink restores fluids, salts, and minerals in the blood (electrolytes). Stop any activities that caused dehydration, such as exercise. Cool off with cool compresses, cool mist, or cool fluids, if heat or too much sweat caused your condition. Take medicine to treat fever, if fever caused your condition. Take medicine to treat nausea and diarrhea, if vomiting or diarrhea caused your condition. Severe dehydration can be treated: With IV fluids. By correcting abnormal levels of electrolytes in your body. By treating the underlying cause of dehydration. Follow these instructions at home: Oral rehydration solution If told by your health care provider, drink an ORS: Make an ORS by following instructions on the package. Start by drinking small amounts, about  cup (120 mL) every 5-10 minutes. Slowly increase how much you drink until you have taken the amount recommended by your health care provider.  Eating and drinking  Drink enough clear fluid to keep   your urine pale yellow. If you were told to drink an ORS, finish the ORS first and then start slowly drinking other clear fluids. Drink fluids such as: Water. Do not drink only water. Doing that can lead to hyponatremia, which  is having too little salt (sodium) in the body. Water from ice chips you suck on. Diluted fruit juice. This is fruit juice that you have added water to. Low-calorie sports drinks. Eat foods that contain a healthy balance of electrolytes, such as bananas, oranges, potatoes, tomatoes, and spinach. Do not drink alcohol. Avoid the following: Drinks that contain a lot of sugar. These include high-calorie sports drinks, fruit juice that is not diluted, and soda. Caffeine. Foods that are greasy or contain a lot of fat or sugar. General instructions Take over-the-counter and prescription medicines only as told by your health care provider. Do not take sodium tablets. Doing that can lead to having too much sodium in the body (hypernatremia). Return to your normal activities as told by your health care provider. Ask your health care provider what activities are safe for you. Keep all follow-up visits. Your health care provider may need to check your progress and suggest new ways to treat your condition. Contact a health care provider if: You have muscle cramps, pain, or discomfort, such as: Pain in your abdomen and the pain gets worse or stays in one area. Stiff neck. You have a rash. You are more irritable than usual. You are sleepier or have a harder time waking. You feel weak or dizzy. You feel very thirsty. Get help right away if: You have symptoms of severe dehydration. You vomit every time you eat or drink. Your vomiting gets worse, does not go away, or includes blood or green matter (bile). You are getting treatment but symptoms are getting worse. You have a fever. You have a severe headache. You have: Diarrhea that gets worse or does not go away. Blood in your stool. This may cause stool to look black and tarry. Not urinating, or urinating only a small amount of very dark urine, within 6-8 hours. You have trouble breathing. These symptoms may be an emergency. Get help right  away. Do not wait to see if the symptoms will go away. Do not drive yourself to the hospital. Call 911. This information is not intended to replace advice given to you by your health care provider. Make sure you discuss any questions you have with your health care provider. Document Revised: 12/05/2021 Document Reviewed: 12/05/2021 Elsevier Patient Education  2023 Elsevier Inc.  Pegfilgrastim Injection What is this medication? PEGFILGRASTIM (PEG fil gra stim) lowers the risk of infection in people who are receiving chemotherapy. It works by helping your body make more white blood cells, which protects your body from infection. It may also be used to help people who have been exposed to high doses of radiation. This medicine may be used for other purposes; ask your health care provider or pharmacist if you have questions. COMMON BRAND NAME(S): Fulphila, Fylnetra, Neulasta, Nyvepria, Stimufend, UDENYCA, Ziextenzo What should I tell my care team before I take this medication? They need to know if you have any of these conditions: Kidney disease Latex allergy Ongoing radiation therapy Sickle cell disease Skin reactions to acrylic adhesives (On-Body Injector only) An unusual or allergic reaction to pegfilgrastim, filgrastim, other medications, foods, dyes, or preservatives Pregnant or trying to get pregnant Breast-feeding How should I use this medication? This medication is for injection under the skin. If you   get this medication at home, you will be taught how to prepare and give the pre-filled syringe or how to use the On-body Injector. Refer to the patient Instructions for Use for detailed instructions. Use exactly as directed. Tell your care team immediately if you suspect that the On-body Injector may not have performed as intended or if you suspect the use of the On-body Injector resulted in a missed or partial dose. It is important that you put your used needles and syringes in a special  sharps container. Do not put them in a trash can. If you do not have a sharps container, call your pharmacist or care team to get one. Talk to your care team about the use of this medication in children. While this medication may be prescribed for selected conditions, precautions do apply. Overdosage: If you think you have taken too much of this medicine contact a poison control center or emergency room at once. NOTE: This medicine is only for you. Do not share this medicine with others. What if I miss a dose? It is important not to miss your dose. Call your care team if you miss your dose. If you miss a dose due to an On-body Injector failure or leakage, a new dose should be administered as soon as possible using a single prefilled syringe for manual use. What may interact with this medication? Interactions have not been studied. This list may not describe all possible interactions. Give your health care provider a list of all the medicines, herbs, non-prescription drugs, or dietary supplements you use. Also tell them if you smoke, drink alcohol, or use illegal drugs. Some items may interact with your medicine. What should I watch for while using this medication? Your condition will be monitored carefully while you are receiving this medication. You may need blood work done while you are taking this medication. Talk to your care team about your risk of cancer. You may be more at risk for certain types of cancer if you take this medication. If you are going to need a MRI, CT scan, or other procedure, tell your care team that you are using this medication (On-Body Injector only). What side effects may I notice from receiving this medication? Side effects that you should report to your care team as soon as possible: Allergic reactions--skin rash, itching, hives, swelling of the face, lips, tongue, or throat Capillary leak syndrome--stomach or muscle pain, unusual weakness or fatigue, feeling faint or  lightheaded, decrease in the amount of urine, swelling of the ankles, hands, or feet, trouble breathing High white blood cell level--fever, fatigue, trouble breathing, night sweats, change in vision, weight loss Inflammation of the aorta--fever, fatigue, back, chest, or stomach pain, severe headache Kidney injury (glomerulonephritis)--decrease in the amount of urine, red or dark brown urine, foamy or bubbly urine, swelling of the ankles, hands, or feet Shortness of breath or trouble breathing Spleen injury--pain in upper left stomach or shoulder Unusual bruising or bleeding Side effects that usually do not require medical attention (report to your care team if they continue or are bothersome): Bone pain Pain in the hands or feet This list may not describe all possible side effects. Call your doctor for medical advice about side effects. You may report side effects to FDA at 1-800-FDA-1088. Where should I keep my medication? Keep out of the reach of children. If you are using this medication at home, you will be instructed on how to store it. Throw away any unused medication after the   expiration date on the label. NOTE: This sheet is a summary. It may not cover all possible information. If you have questions about this medicine, talk to your doctor, pharmacist, or health care provider.  2023 Elsevier/Gold Standard (2021-01-21 00:00:00)  

## 2022-10-06 ENCOUNTER — Encounter: Payer: Self-pay | Admitting: Physician Assistant

## 2022-10-09 ENCOUNTER — Other Ambulatory Visit: Payer: Self-pay

## 2022-10-09 ENCOUNTER — Other Ambulatory Visit: Payer: Self-pay | Admitting: Adult Health

## 2022-10-09 ENCOUNTER — Telehealth: Payer: Self-pay | Admitting: *Deleted

## 2022-10-09 ENCOUNTER — Other Ambulatory Visit: Payer: Self-pay | Admitting: *Deleted

## 2022-10-09 MED ORDER — FLUCONAZOLE 200 MG PO TABS: 200.0000 mg | ORAL_TABLET | Freq: Every day | ORAL | 0 refills | Status: DC

## 2022-10-09 NOTE — Telephone Encounter (Signed)
Diamond Marshall states she is having some bleeding in her groin. "It has been red, looks like yeast infection". Temp was 100.1.

## 2022-10-09 NOTE — Telephone Encounter (Signed)
Notified that Diflucan 200 mg daily for 3 days has been sent to her pharmacy

## 2022-10-10 ENCOUNTER — Inpatient Hospital Stay: Payer: Commercial Managed Care - PPO | Admitting: Physician Assistant

## 2022-10-10 ENCOUNTER — Other Ambulatory Visit: Payer: Self-pay

## 2022-10-10 ENCOUNTER — Inpatient Hospital Stay: Payer: Commercial Managed Care - PPO

## 2022-10-10 DIAGNOSIS — C50212 Malignant neoplasm of upper-inner quadrant of left female breast: Secondary | ICD-10-CM

## 2022-10-10 DIAGNOSIS — Z5112 Encounter for antineoplastic immunotherapy: Secondary | ICD-10-CM | POA: Diagnosis not present

## 2022-10-10 MED ORDER — SODIUM CHLORIDE 0.9 % IV SOLN: Freq: Once | INTRAVENOUS | Status: AC

## 2022-10-10 NOTE — Patient Instructions (Signed)

## 2022-10-17 ENCOUNTER — Other Ambulatory Visit: Payer: Self-pay

## 2022-10-19 ENCOUNTER — Other Ambulatory Visit: Payer: Self-pay

## 2022-10-19 ENCOUNTER — Telehealth: Payer: Self-pay

## 2022-10-19 DIAGNOSIS — R197 Diarrhea, unspecified: Secondary | ICD-10-CM

## 2022-10-19 NOTE — Progress Notes (Signed)
Orders entered for [facility] visit. 

## 2022-10-19 NOTE — Telephone Encounter (Signed)
Patient called [facility] reporting diarrhea X 24 hours. Per patient, she has been taking "everything to stop it" with no relief. She maintains good PO intake by drinking Gatorade, liquid IV and water. No vomiting, dizziness, blood in stool or fevers. She states that the stool is yellow and has a foul odor. Denies recent use of antibiotics, sick exposures or eating risky foods (other than pizza). Patient verbalized understanding that she can be evaluated tomorrow and reports that she can wait until then as she does not feel dehydrated. Patient aware of date, time and location of appointment- no further questions or concerns at this time.

## 2022-10-20 ENCOUNTER — Inpatient Hospital Stay (HOSPITAL_BASED_OUTPATIENT_CLINIC_OR_DEPARTMENT_OTHER): Payer: Commercial Managed Care - PPO | Admitting: Physician Assistant

## 2022-10-20 ENCOUNTER — Encounter (HOSPITAL_COMMUNITY): Payer: Self-pay

## 2022-10-20 ENCOUNTER — Inpatient Hospital Stay: Payer: Commercial Managed Care - PPO

## 2022-10-20 ENCOUNTER — Telehealth: Payer: Self-pay | Admitting: Adult Health

## 2022-10-20 ENCOUNTER — Other Ambulatory Visit: Payer: Self-pay

## 2022-10-20 ENCOUNTER — Emergency Department (HOSPITAL_COMMUNITY): Payer: Commercial Managed Care - PPO

## 2022-10-20 ENCOUNTER — Emergency Department (HOSPITAL_COMMUNITY)
Admission: EM | Admit: 2022-10-20 | Discharge: 2022-10-20 | Disposition: A | Discharge status: Home / Self Care | Payer: Commercial Managed Care - PPO | Attending: Emergency Medicine | Admitting: Emergency Medicine

## 2022-10-20 VITALS — BP 134/81 | HR 92 | Temp 98.6°F | Resp 18

## 2022-10-20 DIAGNOSIS — R197 Diarrhea, unspecified: Secondary | ICD-10-CM

## 2022-10-20 DIAGNOSIS — C50212 Malignant neoplasm of upper-inner quadrant of left female breast: Secondary | ICD-10-CM | POA: Diagnosis not present

## 2022-10-20 DIAGNOSIS — Z170 Estrogen receptor positive status [ER+]: Secondary | ICD-10-CM

## 2022-10-20 DIAGNOSIS — C50919 Malignant neoplasm of unspecified site of unspecified female breast: Secondary | ICD-10-CM | POA: Diagnosis not present

## 2022-10-20 DIAGNOSIS — Z95828 Presence of other vascular implants and grafts: Secondary | ICD-10-CM

## 2022-10-20 DIAGNOSIS — R0902 Hypoxemia: Secondary | ICD-10-CM | POA: Diagnosis present

## 2022-10-20 DIAGNOSIS — R12 Heartburn: Secondary | ICD-10-CM

## 2022-10-20 LAB — CMP (CANCER CENTER ONLY)
ALT: 43 U/L (ref 0–44)
AST: 39 U/L (ref 15–41)
Albumin: 3.5 g/dL (ref 3.5–5.0)
Alkaline Phosphatase: 88 U/L (ref 38–126)
Anion gap: 6 (ref 5–15)
BUN: 7 mg/dL (ref 6–20)
CO2: 30 mmol/L (ref 22–32)
Calcium: 9 mg/dL (ref 8.9–10.3)
Chloride: 105 mmol/L (ref 98–111)
Creatinine: 0.8 mg/dL (ref 0.44–1.00)
GFR, Estimated: >60 mL/min (ref >60)
Glucose, Bld: 131 mg/dL — ABNORMAL HIGH (ref 70–99)
Potassium: 3.9 mmol/L (ref 3.5–5.1)
Sodium: 141 mmol/L (ref 135–145)
Total Bilirubin: 0.4 mg/dL (ref 0.3–1.2)
Total Protein: 6.5 g/dL (ref 6.5–8.1)

## 2022-10-20 LAB — CBC WITH DIFFERENTIAL (CANCER CENTER ONLY)
Abs Immature Granulocytes: 0.07 10*3/uL (ref 0.00–0.07)
Basophils Absolute: 0.1 10*3/uL (ref 0.0–0.1)
Basophils Relative: 1 %
Eosinophils Absolute: 0 10*3/uL (ref 0.0–0.5)
Eosinophils Relative: 1 %
HCT: 30.3 % — ABNORMAL LOW (ref 36.0–46.0)
Hemoglobin: 9.2 g/dL — ABNORMAL LOW (ref 12.0–15.0)
Immature Granulocytes: 1 %
Lymphocytes Relative: 23 %
Lymphs Abs: 1.5 10*3/uL (ref 0.7–4.0)
MCH: 25.6 pg — ABNORMAL LOW (ref 26.0–34.0)
MCHC: 30.4 g/dL (ref 30.0–36.0)
MCV: 84.4 fL (ref 80.0–100.0)
Monocytes Absolute: 0.3 10*3/uL (ref 0.1–1.0)
Monocytes Relative: 5 %
Neutro Abs: 4.3 10*3/uL (ref 1.7–7.7)
Neutrophils Relative %: 69 %
Platelet Count: 328 10*3/uL (ref 150–400)
RBC: 3.59 MIL/uL — ABNORMAL LOW (ref 3.87–5.11)
RDW: 20.3 % — ABNORMAL HIGH (ref 11.5–15.5)
WBC Count: 6.3 10*3/uL (ref 4.0–10.5)
nRBC: 0.8 % — ABNORMAL HIGH (ref 0.0–0.2)

## 2022-10-20 LAB — MAGNESIUM: Magnesium: 1.8 mg/dL (ref 1.7–2.4)

## 2022-10-20 MED ORDER — SODIUM CHLORIDE 0.9% FLUSH
10.0000 mL | Freq: Once | INTRAVENOUS | Status: AC
  Administered 2022-10-20: 10 mL

## 2022-10-20 MED ORDER — SODIUM CHLORIDE 0.9 % IV SOLN: Freq: Once | INTRAVENOUS | Status: AC

## 2022-10-20 MED ORDER — DOXYCYCLINE HYCLATE 100 MG PO TABS: 100.0000 mg | ORAL_TABLET | Freq: Two times a day (BID) | ORAL | 0 refills | Status: AC

## 2022-10-20 MED ORDER — FAMOTIDINE IN NACL 20-0.9 MG/50ML-% IV SOLN
20.0000 mg | Freq: Once | INTRAVENOUS | Status: AC
  Administered 2022-10-20: 20 mg via INTRAVENOUS
  Filled 2022-10-20: qty 50

## 2022-10-20 MED ORDER — HEPARIN SOD (PORK) LOCK FLUSH 100 UNIT/ML IV SOLN
500.0000 [IU] | Freq: Once | INTRAVENOUS | Status: AC
  Administered 2022-10-20: 500 [IU]

## 2022-10-20 MED ORDER — IOHEXOL 350 MG/ML SOLN
100.0000 mL | Freq: Once | INTRAVENOUS | Status: AC | PRN
  Administered 2022-10-20: 100 mL via INTRAVENOUS

## 2022-10-20 NOTE — ED Provider Notes (Signed)
[place] EMERGENCY DEPARTMENT AT [facility] Provider Note   CSN: [id number] Arrival date & time: 10/20/22  1138     History  Chief Complaint  Patient presents with   Hypoxia    Diamond Marshall is a 52 y.o. female.  HPI   Patient has a history of reflux, thyroid disease, pernicious anemia, breast cancer who presents to the ED for evaluation of low oxygen saturation.  Patient states she went to the cancer center for treatment today.  She states she was not having any specific problems although she has been having some diarrhea associated with her hemotherapy.  Patient had mentioned that she had noted some shortness of breath and fatigue with ambulation.  At the cancer center she was noted to have decreased oxygen saturation in the 80s.  Patient ambulated around the department and her oxygen saturation dropped to 83% with a heart rate of 115.  Patient was sent to the ED for further evaluation.  Patient denies any known history of PE or DVT.  Patient states her breast cancer has not metastasized to her lungs as far she knows.  Right now she is not having any shortness of breath or chest pain  Home Medications Prior to Admission medications   Medication Sig Start Date End Date Taking? Authorizing Provider  doxycycline (VIBRA-TABS) 100 MG tablet Take 1 tablet (100 mg total) by mouth 2 (two) times daily for 7 days. 10/20/22 10/27/22 Yes Linwood Dibbles, MD  fluconazole (DIFLUCAN) 200 MG tablet Take 1 tablet (200 mg total) by mouth daily. Take for 3 days 10/09/22   Loa Socks, NP  cholestyramine Lanetta Inch) 4 g packet Take 1 packet (4 g total) by mouth 3 (three) times daily with meals. Patient taking differently: Take 4 g by mouth 3 (three) times daily as needed (diarrhea). 07/24/22   Walisiewicz, Yvonna Alanis E, PA-C  cyanocobalamin (,VITAMIN B-12,) 1000 MCG/ML injection INJECT 1 ML INTRAMUSCULARLY EVERY 15 DAYS Patient taking differently: Inject 1,000 mcg into the muscle every 30  (thirty) days. 08/19/21   Mechele Claude, MD  dexamethasone (DECADRON) 4 MG tablet Take 2 tabs by mouth 2 times daily starting day before chemo. Then take 2 tabs daily for 2 days starting day after chemo. Take with food. 07/03/22   Rachel Moulds, MD  diphenoxylate-atropine (LOMOTIL) 2.5-0.025 MG tablet TAKE TWO TABLETS EVERY 6 HOURS AS NEEDED FOR DIARRHEA 10/03/22   Loa Socks, NP  EPINEPHrine 0.3 mg/0.3 mL IJ SOAJ injection Inject 0.3 mg into the muscle as needed for anaphylaxis. 12/16/21   Gwenlyn Fudge, FNP  escitalopram (LEXAPRO) 20 MG tablet Take 1 tablet (20 mg total) by mouth daily. 07/21/21   Mechele Claude, MD  lidocaine-prilocaine (EMLA) cream Apply to affected area once 07/03/22   Rachel Moulds, MD  lisinopril (ZESTRIL) 20 MG tablet Take 20 mg by mouth daily.    [provider]  LORazepam (ATIVAN) 0.5 MG tablet Take 1 tablet (0.5 mg total) by mouth every 8 (eight) hours as needed for anxiety. 06/22/22   Rachel Moulds, MD  ondansetron (ZOFRAN) 8 MG tablet Take 1 tablet (8 mg total) by mouth every 8 (eight) hours as needed for nausea or vomiting. 07/03/22   Rachel Moulds, MD  oxyCODONE (OXY IR/ROXICODONE) 5 MG immediate release tablet Take 1 tablet (5 mg total) by mouth every 6 (six) hours as needed for severe pain. 07/05/22   Cornett, Maisie Fus, MD  pantoprazole (PROTONIX) 40 MG tablet Take 2 tablets (80 mg total) by mouth  daily. Patient taking differently: Take 40 mg by mouth daily. 07/21/21   Mechele Claude, MD  prochlorperazine (COMPAZINE) 10 MG tablet Take 1 tablet (10 mg total) by mouth every 6 (six) hours as needed for nausea or vomiting. 07/03/22   Rachel Moulds, MD      Allergies    Misc. sulfonamide containing compounds, Phentermine, Bee venom, Peanut allergen powder-dnfp, Topiramate, Septra [sulfamethoxazole-trimethoprim], and Sulfa antibiotics    Review of Systems   Review of Systems  Physical Exam Updated Vital Signs BP 114/73   Pulse 87   Temp 98.3  F (36.8 C) (Oral)   Resp 19   Ht 1.6 m (5\' 3" )   Wt (!) 149.7 kg   SpO2 95%   BMI 58.46 kg/m  Physical Exam Vitals and nursing note reviewed.  Constitutional:      General: She is not in acute distress.    Appearance: She is well-developed.  HENT:     Head: Normocephalic and atraumatic.     Right Ear: External ear normal.     Left Ear: External ear normal.  Eyes:     General: No scleral icterus.       Right eye: No discharge.        Left eye: No discharge.     Conjunctiva/sclera: Conjunctivae normal.  Neck:     Trachea: No tracheal deviation.  Cardiovascular:     Rate and Rhythm: Normal rate and regular rhythm.  Pulmonary:     Effort: Pulmonary effort is normal. No respiratory distress.     Breath sounds: Normal breath sounds. No stridor. No wheezing or rales.  Abdominal:     General: Bowel sounds are normal. There is no distension.     Palpations: Abdomen is soft.     Tenderness: There is no abdominal tenderness. There is no guarding or rebound.  Musculoskeletal:        General: No tenderness or deformity.     Cervical back: Neck supple.     Right lower leg: No edema.     Left lower leg: No edema.  Skin:    General: Skin is warm and dry.     Findings: No rash.  Neurological:     General: No focal deficit present.     Mental Status: She is alert.     Cranial Nerves: No cranial nerve deficit, dysarthria or facial asymmetry.     Sensory: No sensory deficit.     Motor: No abnormal muscle tone or seizure activity.     Coordination: Coordination normal.  Psychiatric:        Mood and Affect: Mood normal.     ED Results / Procedures / Treatments   Labs (all labs ordered are listed, but only abnormal results are displayed) Labs Reviewed - No data to display  EKG EKG Interpretation  Date/Time:  Friday Oct 20 2022 11:43:21 EDT Ventricular Rate:  88 PR Interval:  157 QRS Duration: 91 QT Interval:  399 QTC Calculation: 483 R Axis:   17 Text  Interpretation: Sinus rhythm Low voltage, precordial leads No significant change since last tracing Confirmed by Linwood Dibbles [phone]) on 10/20/2022 11:51:24 AM  Radiology CT Angio Chest PE W and/or Wo Contrast  Result Date: 10/20/2022 CLINICAL DATA:  Hypoxia EXAM: CT ANGIOGRAPHY CHEST WITH CONTRAST TECHNIQUE: Multidetector CT imaging of the chest was performed using the standard protocol during bolus administration of intravenous contrast. Multiplanar CT image reconstructions and MIPs were obtained to evaluate the vascular anatomy. RADIATION DOSE REDUCTION: This  exam was performed according to the departmental dose-optimization program which includes automated exposure control, adjustment of the mA and/or kV according to patient size and/or use of iterative reconstruction technique. CONTRAST:  OMNIPAQUE IOHEXOL 350 MG/ML SOLN COMPARISON:  Previous studies including the examination of 09/11/2022 FINDINGS: Cardiovascular: There are no intraluminal filling defects in pulmonary artery branches. There is homogeneous enhancement in the thoracic aorta. Heart is enlarged in size. Mediastinum/Nodes: There are slightly enlarged lymph nodes in mediastinum and both hilar regions which appear more prominent. Clinical history suggests metastatic breast carcinoma. Lungs/Pleura: There are scattered faint ground-glass densities in both lungs. There is mosaic pattern in the lower lung fields. There is no focal consolidation. There is no pleural effusion or pneumothorax. Upper Abdomen: Unremarkable. Musculoskeletal: No acute findings are seen. Review of the MIP images confirms the above findings. IMPRESSION: There is no evidence of pulmonary embolism. There is no evidence of thoracic aortic dissection. There are slightly enlarged lymph nodes in mediastinum which appear more prominent. Short-term follow-up CT in 3 months or PET-CT as clinically warranted may be considered. Diffuse patchy ground-glass densities are seen in both  lungs suggesting possible interstitial pneumonia. Mosaic pattern seen in the lower lung fields may suggest small airway disease. Electronically Signed   By: Ernie Avena M.D.   On: 10/20/2022 14:11    Procedures Procedures    Medications Ordered in ED Medications  iohexol (OMNIPAQUE) 350 MG/ML injection 100 mL (100 mLs Intravenous Contrast Given 10/20/22 1304)    ED Course/ Medical Decision Making/ A&P Clinical Course as of 10/20/22 1448  Fri Oct 20, 2022  1204 Labs reviewed from this morning drawn at the cancer center.  Metabolic panel unremarkable [JK]  1205 CBC shows anemia with a hemoglobin of 9.2.  Similar 2 weeks ago where it was ranging from 8.9-11.5 [JK]  1426 CT scan shows no evidence of pulmonary embolism.  Slightly enlarged lymph nodes noted in the mediastinum.  Chest x-ray shows diffuse patchy groundglass densities possible interstitial pneumonia [JK]    Clinical Course User Index [JK] Linwood Dibbles, MD                             Medical Decision Making Problems Addressed: Malignant neoplasm of female breast, unspecified estrogen receptor status, unspecified laterality, unspecified site of breast [facility]): chronic illness or injury  Amount and/or Complexity of Data Reviewed Radiology: ordered and independent interpretation performed.  Risk Prescription drug management.   Patient presented to the ED for evaluation of low oxygen saturation noted at the oncology office.  Patient in the ED has had oxygen titrations in the mid 90s without any supplemental oxygen.  Patient CT scan does not show any evidence of pulmonary embolism.  There are findings to suggest possible interstitial lung disease or interstitial pneumonia.  I will start the patient on a course of doxycycline considering her immunocompromise state.  She is afebrile nontoxic and overall feels well.  She appears stable for discharge and outpatient management.  Lymph node findings will be following with her  oncology team        Final Clinical Impression(s) / ED Diagnoses Final diagnoses:  Malignant neoplasm of female breast, unspecified estrogen receptor status, unspecified laterality, unspecified site of breast (HCC)    Rx / DC Orders ED Discharge Orders          Ordered    doxycycline (VIBRA-TABS) 100 MG tablet  2 times daily  10/20/22 1447              Linwood Dibbles, MD 10/20/22 [phone]

## 2022-10-20 NOTE — ED Triage Notes (Signed)
Pt was at cancer center to receive fluids for dehydration. Pt became hypoxic, no shortness of breath or distress. Pt was 86% on room air. 3L [place] applied to pt and pt came up to 95%. Pt is on 1L [place] when she arrives and is maintaining at 97%. Pt still denies being in any distress

## 2022-10-20 NOTE — Progress Notes (Signed)
Symptom Management Consult Note [facility]    Patient Care Team: Mechele Claude, MD as PCP - General (Family Medicine) Harriette Bouillon, MD as Consulting Physician (General Surgery) Rachel Moulds, MD as Consulting Physician (Hematology and Oncology) Lonie Peak, MD as Attending Physician (Radiation Oncology) Pershing Proud, RN as Oncology Nurse Navigator Donnelly Angelica, RN as Oncology Nurse Navigator    Name / MRN / DOB: Diamond Marshall  [id number]  Sep 20, 1970   Date of visit: 10/20/2022   Chief Complaint/Reason for visit: diarrhea   Current Therapy: Taxotere, Herceptin, Perjeta   Last treatment:  Day 1   Cycle 5 on 10/03/22   ASSESSMENT & PLAN: Patient is a 52 y.o. female  with oncologic history of malignant neoplasm of upper-inner quadrant of left breast in female, estrogen receptor positive  followed by Dr. Al Pimple.  I have viewed most recent oncology note and lab work.    #Malignant neoplasm of upper-inner quadrant of left breast in female, estrogen receptor positive  - Next appointment with oncologist is 10/24/22   #Diarrhea -Has been ongoing during chemotherapy treatments. -As of today diarrhea has improved with Lomotil, Imodium and Questran. -Patient given 500 mL normal saline for hydration support. -CMP without electrolyte derangement.  Normal renal function. -Abdominal exam with negative Murphy sign.  Discussed with patient possibility of getting outpatient ultrasound.  With how mild her pain is currently prefers to closely monitor symptoms and hold off on additional workup at this time.  Patient aware of symptoms that would warrant ED evaluation.   #Hypoxia Patient found to have new hypoxia on clinic arrival. After ambulating to the exam room and being triaged, labs collected and IVF started, approximately 10 minutes, oxygen saturation was 87% on room air. After finishing my exam I ambulated patient in clinic and oxygen saturation dropped to 83%  on room air with HR 115. She was placed on 1L [place] and O2 improved to 92%. Lung exam with decreased air movement in lung bases. -CBC with hemoglobin of 9.2, anemia has been ongoing during treatment as well ranging between 8.9 and 11.5.  Denies any active bleeding. -Patient reports dyspnea on exertion with ambulation x 1 month with it becoming noticeable after cycle 3 although has not check oxygen saturations at outside of clinic appointments. -Engaged in shared decision making with patient. She has risk factors for PE with malignancy, sedentary lifestyle and obesity. I am unable to get an appointment for outpatient CTA so patient will require ED evaluation. She is agreeable with plan. Report given to accepting RN.   Heme/Onc History: Oncology History  Malignant neoplasm of upper-inner quadrant of left breast in female, estrogen receptor positive (HCC)  06/06/2022 Mammogram   Diagnostic mammogram given palpable mass showed suspicious spiculated left breast mass with surrounding nodularity at 9 o'clock position.  This measures up to 3.1 cm sonographically however due to deep location and patient body habitus the mammographic measurement of 5.8 cm is felt to be more accurate.  At least 2 abnormal lymph nodes in the left axilla.  Possible abnormal palpable left supraclavicular lymph node.   06/06/2022 Breast US   Breast ultrasound confirmed these findings.   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  Prognostic showed ER 65% weak to strong staining, PR 15% weak to moderate staining, Ki-67 of 50% and  HER2 positive by IHC at 3+    Genetic Testing   [facility] Custom Panel+RNA was Negative. Report date is 06/28/2022.   The Custom Hereditary Cancers Panel offered by [facility] includes sequencing and/or deletion duplication testing of the  following 43 genes: APC, ATM, AXIN2, BAP1, BARD1, BMPR1A, BRCA1, BRCA2, BRIP1, CDH1, CDK4, CDKN2A (p14ARF and p16INK4a only), CHEK2, CTNNA1, EPCAM (Deletion/duplication testing only), FH, GREM1 (promoter region duplication testing only), HOXB13, KIT, MBD4, MEN1, MLH1, MSH2, MSH3, MSH6, MUTYH, NF1, NHTL1, PALB2, PDGFRA, PMS2, POLD1, POLE, PTEN, RAD51C, RAD51D, SMAD4, SMARCA4. STK11, TP53, TSC1, TSC2, and VHL.    06/29/2022 PET scan   IMPRESSION: 1. Hypermetabolic left breast mass, compatible with primary breast malignancy. 2. Hypermetabolic left cervical, axillary, subpectoral, supraclavicular and prevascular mediastinal lymph nodes, compatible with nodal metastatic disease. 3. No evidence of metastatic disease in the abdomen or pelvis. 4. Aortic Atherosclerosis (ICD10-I70.0).     Electronically Signed   By: Allegra Lai M.D.   On: 06/29/2022 09:32   07/11/2022 -  Chemotherapy   Patient is on Treatment Plan : BREAST DOCEtaxel + Trastuzumab + Pertuzumab (THP) q21d x 8 cycles / Trastuzumab + Pertuzumab q21d x 4 cycles         Interval history-: Diamond Marshall is a 52 y.o. female with oncologic history as above presenting to [facility] today with chief complaint of diarrhea. She is accompanied by her father who provides additional history.  Patient has struggled with diarrhea during chemotherapy.  This episode started x 4 days ago.  Patient states at its worst she was experiencing liquid stool every hour, this was on Wednesday.  She has been taking Lomotil, Imodium and Questran packets.  She feels as if today is the first day the diarrhea is improving.  She has only had 1 episode prior to arrival.  She reports associated nausea and vomiting.  She has been taking Zofran nausea has now resolved.  She has been trying to stay hydrated drinking at least 40 ounces of fluid daily although has barely been eating.  She does mid to some epigastric pain.  It is intermittent and noticeable after eating.  She  states it lasts approximately 30 minutes and resolve spontaneously.  She has been eating more greasy and fatty foods as she can only tolerate that during treatment she found.  She admits to history of issues with her gallbladder.  She states she has never had gallstones just been told that it gets inflamed.  She does not feel as if her pain is as severe as it has been in the past.  She plans to follow-up outpatient with PCP if pain continues she does not feel it needs to be addressed at this time.  She also admits to extreme fatigue.  She spends most of her time laying in bed.  She has noticed with activity she becomes extremely winded.  This has been going on since cycle 3 of treatment which was 08/24/2022. Patient denies any fever, chills, chest pain, back pain, leg pain.      ROS  All other systems are reviewed and are negative for acute change except as noted in the HPI.    Allergies  Allergen Reactions   Misc. Sulfonamide Containing Compounds Rash and Itching   Phentermine     Became suicidal on the medication   Bee Venom Swelling   Peanut Allergen Powder-Dnfp Other (See Comments)    Migraine   Topiramate Other (See Comments)    dizziness   Septra [Sulfamethoxazole-Trimethoprim] Rash  Sulfa Antibiotics Rash     Past Medical History:  Diagnosis Date   Abdominal hernia    Anemia    b12 deficiency   Anxiety    Arthritis    B12 deficiency    Back pain    Cancer (HCC)    breast cancer   Depression    Family history of adverse reaction to anesthesia    mother vomits after anesthesia   Gallbladder problem    GERD (gastroesophageal reflux disease)    History of hiatal hernia    "small" per patient   Hypertension    Hypothyroidism    Joint pain    Knee pain    Palpitation    "when iron is low"   Pernicious anemia    SOB (shortness of breath)    Thyroid disease    hypothyroidism   Vitamin D deficiency      Past Surgical History:  Procedure Laterality Date    BREAST BIOPSY Left 06/09/2022   [place] LT BREAST BX W LOC DEV 1ST LESION IMG BX SPEC US GUIDE 06/09/2022 [facility]   CESAREAN SECTION  1999   Dilatation and currettagement     for miscarriage 18 years ago   PORTACATH PLACEMENT Right 07/05/2022   Procedure: INSERTION PORT-A-CATH;  Surgeon: Harriette Bouillon, MD;  Location: [facility] OR;  Service: General;  Laterality: Right;    Social History   Socioeconomic History   Marital status: Married    Spouse name: Kathlene November   Number of children: Not on file   Years of education: Not on file   Highest education level: Not on file  Occupational History   Occupation: [employer] copy  Tobacco Use   Smoking status: Never   Smokeless tobacco: Never  Vaping Use   Vaping Use: Never used  Substance and Sexual Activity   Alcohol use: No   Drug use: No   Sexual activity: Yes    Birth control/protection: Pill  Other Topics Concern   Not on file  Social History Narrative   Not on file   Social Determinants of Health   Financial Resource Strain: Not on file  Food Insecurity: No Food Insecurity (09/30/2022)   Hunger Vital Sign    Worried About Running Out of Food in the Last Year: Never true    Ran Out of Food in the Last Year: Never true  Transportation Needs: No Transportation Needs (09/30/2022)   PRAPARE - Administrator, Civil Service (Medical): No    Lack of Transportation (Non-Medical): No  Physical Activity: Not on file  Stress: Not on file  Social Connections: Not on file  Intimate Partner Violence: Not At Risk (09/30/2022)   Humiliation, Afraid, Rape, and Kick questionnaire    Fear of Current or Ex-Partner: No    Emotionally Abused: No    Physically Abused: No    Sexually Abused: No    Family History  Problem Relation Age of Onset   Hypertension Mother    Diabetes Mother    High Cholesterol Mother    Thyroid disease Mother    Depression Mother    Anxiety disorder Mother    Obesity Mother    CVA Father         hemorrhagic   Ovarian cancer Maternal Aunt 23   Colon cancer Maternal Aunt    Breast cancer Cousin 48       maternal first cousin, reports negative genetic testing     Current Outpatient Medications:  fluconazole (DIFLUCAN) 200 MG tablet, Take 1 tablet (200 mg total) by mouth daily. Take for 3 days, Disp: 3 tablet, Rfl: 0   cholestyramine (QUESTRAN) 4 g packet, Take 1 packet (4 g total) by mouth 3 (three) times daily with meals. (Patient taking differently: Take 4 g by mouth 3 (three) times daily as needed (diarrhea).), Disp: 60 each, Rfl: 12   cyanocobalamin (,VITAMIN B-12,) 1000 MCG/ML injection, INJECT 1 ML INTRAMUSCULARLY EVERY 15 DAYS (Patient taking differently: Inject 1,000 mcg into the muscle every 30 (thirty) days.), Disp: 6 mL, Rfl: 1   dexamethasone (DECADRON) 4 MG tablet, Take 2 tabs by mouth 2 times daily starting day before chemo. Then take 2 tabs daily for 2 days starting day after chemo. Take with food., Disp: 30 tablet, Rfl: 1   diphenoxylate-atropine (LOMOTIL) 2.5-0.025 MG tablet, TAKE TWO TABLETS EVERY 6 HOURS AS NEEDED FOR DIARRHEA, Disp: 30 tablet, Rfl: 1   EPINEPHrine 0.3 mg/0.3 mL IJ SOAJ injection, Inject 0.3 mg into the muscle as needed for anaphylaxis., Disp: 2 each, Rfl: 0   escitalopram (LEXAPRO) 20 MG tablet, Take 1 tablet (20 mg total) by mouth daily., Disp: 90 tablet, Rfl: 3   lidocaine-prilocaine (EMLA) cream, Apply to affected area once, Disp: 30 g, Rfl: 3   lisinopril (ZESTRIL) 20 MG tablet, Take 20 mg by mouth daily., Disp: , Rfl:    LORazepam (ATIVAN) 0.5 MG tablet, Take 1 tablet (0.5 mg total) by mouth every 8 (eight) hours as needed for anxiety., Disp: 30 tablet, Rfl: 1   ondansetron (ZOFRAN) 8 MG tablet, Take 1 tablet (8 mg total) by mouth every 8 (eight) hours as needed for nausea or vomiting., Disp: 30 tablet, Rfl: 1   oxyCODONE (OXY IR/ROXICODONE) 5 MG immediate release tablet, Take 1 tablet (5 mg total) by mouth every 6 (six) hours as needed for severe  pain., Disp: 15 tablet, Rfl: 0   pantoprazole (PROTONIX) 40 MG tablet, Take 2 tablets (80 mg total) by mouth daily. (Patient taking differently: Take 40 mg by mouth daily.), Disp: 180 tablet, Rfl: 3   prochlorperazine (COMPAZINE) 10 MG tablet, Take 1 tablet (10 mg total) by mouth every 6 (six) hours as needed for nausea or vomiting., Disp: 30 tablet, Rfl: 1 No current facility-administered medications for this visit.  Facility-Administered Medications Ordered in Other Visits:    famotidine (PEPCID) IVPB 20 mg premix, 20 mg, Intravenous, Once, Walisiewicz, Dayannara Pascal E, PA-C, Last Rate: 200 mL/hr at 10/20/22 1124, 20 mg at 10/20/22 1124  PHYSICAL EXAM: ECOG FS:1 - Symptomatic but completely ambulatory    Vitals:   10/20/22 0946 10/20/22 0954 10/20/22 1126  BP: 134/81    Pulse: 97 92   Resp: 18    Temp: 98.6 F (37 C)    TempSrc: Oral    SpO2: (!) 88% 96% (!) 82%   Physical Exam Vitals and nursing note reviewed.  Constitutional:      Appearance: She is obese. She is not ill-appearing or toxic-appearing.  HENT:     Head: Normocephalic.  Eyes:     Conjunctiva/sclera: Conjunctivae normal.  Cardiovascular:     Rate and Rhythm: Regular rhythm. Tachycardia present.     Pulses: Normal pulses.     Heart sounds: Normal heart sounds.  Pulmonary:     Comments: Tachypneic. Decreased air movement in posterior bilateral bases Abdominal:     General: There is no distension.     Tenderness: There is no abdominal tenderness. Negative signs include Murphy's sign.  Musculoskeletal:  Cervical back: Normal range of motion.     Right lower leg: No edema.     Left lower leg: No edema.  Skin:    General: Skin is warm and dry.  Neurological:     Mental Status: She is alert.        LABORATORY DATA: I have reviewed the data as listed    Latest Ref Rng & Units 10/20/2022    9:25 AM 10/03/2022   11:32 AM 09/30/2022    1:36 AM  CBC  WBC 4.0 - 10.5 K/uL 6.3  9.6  11.0   Hemoglobin 12.0 -  15.0 g/dL 9.2  98.1  8.9   Hematocrit 36.0 - 46.0 % 30.3  30.6  29.3   Platelets 150 - 400 K/uL 328  359  212         Latest Ref Rng & Units 10/20/2022    9:25 AM 10/03/2022   11:32 AM 09/30/2022    1:36 AM  CMP  Glucose 70 - 99 mg/dL 191  478  295   BUN 6 - 20 mg/dL 7  8  13    Creatinine 0.44 - 1.00 mg/dL 6.21  3.08  6.57    8.46   Sodium 135 - 145 mmol/L 141  141  137   Potassium 3.5 - 5.1 mmol/L 3.9  3.7  3.6   Chloride 98 - 111 mmol/L 105  107  107   CO2 22 - 32 mmol/L 30  25  21    Calcium 8.9 - 10.3 mg/dL 9.0  8.9  8.3   Total Protein 6.5 - 8.1 g/dL 6.5  6.6    Total Bilirubin 0.3 - 1.2 mg/dL 0.4  0.4    Alkaline Phos 38 - 126 U/L 88  89    AST 15 - 41 U/L 39  17    ALT 0 - 44 U/L 43  23         RADIOGRAPHIC STUDIES (from last 24 hours if applicable) I have personally reviewed the radiological images as listed and agreed with the findings in the report. No results found.      Visit Diagnosis: 1. Hypoxia   2. Diarrhea, unspecified type   3. Malignant neoplasm of upper-inner quadrant of left breast in female, estrogen receptor positive (HCC)      No orders of the defined types were placed in this encounter.   All questions were answered. The patient knows to call the clinic with any problems, questions or concerns. No barriers to learning was detected.  A total of more than 30 minutes were spent on this encounter with face-to-face time and non-face-to-face time, including preparing to see the patient, ordering tests and/or medications, counseling the patient and coordination of care as outlined above.    Thank you for allowing me to participate in the care of this patient.    Shanon Ace, PA-C Department of Hematology/Oncology [facility] at [facility] Phone: [phone]  [phone]    10/20/2022 11:31 AM

## 2022-10-20 NOTE — Telephone Encounter (Signed)
Rescheduled appointment per WQ. Patient was scheduled for IVF along with udenyca injection. Left voicemail.

## 2022-10-20 NOTE — Discharge Instructions (Signed)
the CT scan did not show any evidence of blood clot in the lungs.  It is possible you might have a mild lung infection noted on CT scan.  Take the antibiotics as prescribed.  Follow-up with your oncology doctor to be rechecked as planned.  They will continue to monitor the lymph nodes noted on the chest CT

## 2022-10-20 NOTE — ED Notes (Addendum)
Pt placed on room air to trial oxygen sat without O2

## 2022-10-21 ENCOUNTER — Other Ambulatory Visit: Payer: Self-pay

## 2022-10-23 MED FILL — Dexamethasone Sodium Phosphate Inj 100 MG/10ML: INTRAMUSCULAR | Qty: 1 | Status: AC

## 2022-10-24 ENCOUNTER — Other Ambulatory Visit: Payer: Self-pay | Admitting: Hematology and Oncology

## 2022-10-24 ENCOUNTER — Inpatient Hospital Stay (HOSPITAL_BASED_OUTPATIENT_CLINIC_OR_DEPARTMENT_OTHER): Payer: Commercial Managed Care - PPO | Admitting: Adult Health

## 2022-10-24 ENCOUNTER — Encounter: Payer: Self-pay | Admitting: Adult Health

## 2022-10-24 ENCOUNTER — Inpatient Hospital Stay: Payer: Commercial Managed Care - PPO

## 2022-10-24 ENCOUNTER — Inpatient Hospital Stay: Payer: Commercial Managed Care - PPO | Attending: Hematology and Oncology

## 2022-10-24 ENCOUNTER — Other Ambulatory Visit: Payer: Self-pay

## 2022-10-24 VITALS — BP 104/63 | HR 94 | Resp 18

## 2022-10-24 DIAGNOSIS — Z800 Family history of malignant neoplasm of digestive organs: Secondary | ICD-10-CM | POA: Diagnosis not present

## 2022-10-24 DIAGNOSIS — Z170 Estrogen receptor positive status [ER+]: Secondary | ICD-10-CM | POA: Insufficient documentation

## 2022-10-24 DIAGNOSIS — Z5112 Encounter for antineoplastic immunotherapy: Secondary | ICD-10-CM | POA: Insufficient documentation

## 2022-10-24 DIAGNOSIS — Z803 Family history of malignant neoplasm of breast: Secondary | ICD-10-CM | POA: Diagnosis not present

## 2022-10-24 DIAGNOSIS — E039 Hypothyroidism, unspecified: Secondary | ICD-10-CM | POA: Insufficient documentation

## 2022-10-24 DIAGNOSIS — R5383 Other fatigue: Secondary | ICD-10-CM | POA: Insufficient documentation

## 2022-10-24 DIAGNOSIS — M25561 Pain in right knee: Secondary | ICD-10-CM | POA: Diagnosis not present

## 2022-10-24 DIAGNOSIS — C50212 Malignant neoplasm of upper-inner quadrant of left female breast: Secondary | ICD-10-CM | POA: Insufficient documentation

## 2022-10-24 DIAGNOSIS — G8929 Other chronic pain: Secondary | ICD-10-CM | POA: Insufficient documentation

## 2022-10-24 DIAGNOSIS — I10 Essential (primary) hypertension: Secondary | ICD-10-CM | POA: Diagnosis not present

## 2022-10-24 DIAGNOSIS — J189 Pneumonia, unspecified organism: Secondary | ICD-10-CM | POA: Insufficient documentation

## 2022-10-24 DIAGNOSIS — Z95828 Presence of other vascular implants and grafts: Secondary | ICD-10-CM

## 2022-10-24 DIAGNOSIS — Z8041 Family history of malignant neoplasm of ovary: Secondary | ICD-10-CM | POA: Diagnosis not present

## 2022-10-24 DIAGNOSIS — R197 Diarrhea, unspecified: Secondary | ICD-10-CM | POA: Diagnosis not present

## 2022-10-24 LAB — CBC WITH DIFFERENTIAL (CANCER CENTER ONLY)
Abs Immature Granulocytes: 0.03 10*3/uL (ref 0.00–0.07)
Basophils Absolute: 0.1 10*3/uL (ref 0.0–0.1)
Basophils Relative: 1 %
Eosinophils Absolute: 0.1 10*3/uL (ref 0.0–0.5)
Eosinophils Relative: 1 %
HCT: 30.2 % — ABNORMAL LOW (ref 36.0–46.0)
Hemoglobin: 9.4 g/dL — ABNORMAL LOW (ref 12.0–15.0)
Immature Granulocytes: 1 %
Lymphocytes Relative: 28 %
Lymphs Abs: 1.7 10*3/uL (ref 0.7–4.0)
MCH: 26.3 pg (ref 26.0–34.0)
MCHC: 31.1 g/dL (ref 30.0–36.0)
MCV: 84.4 fL (ref 80.0–100.0)
Monocytes Absolute: 0.4 10*3/uL (ref 0.1–1.0)
Monocytes Relative: 7 %
Neutro Abs: 3.9 10*3/uL (ref 1.7–7.7)
Neutrophils Relative %: 62 %
Platelet Count: 409 10*3/uL — ABNORMAL HIGH (ref 150–400)
RBC: 3.58 MIL/uL — ABNORMAL LOW (ref 3.87–5.11)
RDW: 19.9 % — ABNORMAL HIGH (ref 11.5–15.5)
WBC Count: 6.2 10*3/uL (ref 4.0–10.5)
nRBC: 0 % (ref 0.0–0.2)

## 2022-10-24 LAB — CMP (CANCER CENTER ONLY)
ALT: 29 U/L (ref 0–44)
AST: 27 U/L (ref 15–41)
Albumin: 3.5 g/dL (ref 3.5–5.0)
Alkaline Phosphatase: 84 U/L (ref 38–126)
Anion gap: 7 (ref 5–15)
BUN: 8 mg/dL (ref 6–20)
CO2: 30 mmol/L (ref 22–32)
Calcium: 8.8 mg/dL — ABNORMAL LOW (ref 8.9–10.3)
Chloride: 106 mmol/L (ref 98–111)
Creatinine: 0.77 mg/dL (ref 0.44–1.00)
GFR, Estimated: >60 mL/min (ref >60)
Glucose, Bld: 106 mg/dL — ABNORMAL HIGH (ref 70–99)
Potassium: 3.8 mmol/L (ref 3.5–5.1)
Sodium: 143 mmol/L (ref 135–145)
Total Bilirubin: 0.4 mg/dL (ref 0.3–1.2)
Total Protein: 6.3 g/dL — ABNORMAL LOW (ref 6.5–8.1)

## 2022-10-24 MED ORDER — DIPHENHYDRAMINE HCL 25 MG PO CAPS
50.0000 mg | ORAL_CAPSULE | Freq: Once | ORAL | Status: AC
  Administered 2022-10-24: 50 mg via ORAL
  Filled 2022-10-24: qty 2

## 2022-10-24 MED ORDER — HEPARIN SOD (PORK) LOCK FLUSH 100 UNIT/ML IV SOLN
500.0000 [IU] | Freq: Once | INTRAVENOUS | Status: AC | PRN
  Administered 2022-10-24: 500 [IU]

## 2022-10-24 MED ORDER — SODIUM CHLORIDE 0.9% FLUSH
10.0000 mL | Freq: Once | INTRAVENOUS | Status: AC
  Administered 2022-10-24: 10 mL

## 2022-10-24 MED ORDER — METHYLPREDNISOLONE 4 MG PO TBPK: ORAL_TABLET | ORAL | 0 refills | Status: DC

## 2022-10-24 MED ORDER — TRASTUZUMAB-ANNS CHEMO 150 MG IV SOLR
6.0000 mg/kg | Freq: Once | INTRAVENOUS | Status: AC
  Administered 2022-10-24: 840 mg via INTRAVENOUS
  Filled 2022-10-24: qty 40

## 2022-10-24 MED ORDER — SODIUM CHLORIDE 0.9 % IV SOLN
420.0000 mg | Freq: Once | INTRAVENOUS | Status: AC
  Administered 2022-10-24: 420 mg via INTRAVENOUS
  Filled 2022-10-24: qty 14

## 2022-10-24 MED ORDER — SODIUM CHLORIDE 0.9 % IV SOLN: Freq: Once | INTRAVENOUS | Status: AC

## 2022-10-24 MED ORDER — ACETAMINOPHEN 325 MG PO TABS
650.0000 mg | ORAL_TABLET | Freq: Once | ORAL | Status: AC
  Administered 2022-10-24: 650 mg via ORAL
  Filled 2022-10-24: qty 2

## 2022-10-24 MED ORDER — SODIUM CHLORIDE 0.9% FLUSH
10.0000 mL | INTRAVENOUS | Status: DC | PRN
  Administered 2022-10-24: 10 mL

## 2022-10-24 NOTE — Assessment & Plan Note (Signed)
Diamond Marshall is a 52 year old woman with stage IIIb triple positive breast cancer on treatment with Taxotere Herceptin Perjeta.  She is due today for cycle 6 of therapy.  Given her history in the emergency room with the shortness of breath and the interstitial infiltrate seen on CT angiogram I reviewed her case with Dr. Al Pimple.  While clinically she does not have any signs of pneumonia as her white blood cells are stable, she has not had any fevers or chills, her shortness of breath has improved, and she has no further oxygen desaturation with ambulation there is concerned that the Taxotere could be causing interstitial lung issues.  Diamond Marshall met with Dr. Al Pimple who has taken the Taxotere out of her treatment plan.  Diamond Marshall will continue to take the cholestyramine and Imodium if she develops diarrhea.  She is also returning in a couple days for IV fluids.  Diamond Marshall will return in 3 weeks for labs, f/u, and treatment.

## 2022-10-24 NOTE — Progress Notes (Signed)
Ambulatory pulse oximetry reading completed per NP request. Range from 92%-96%. Pt speaking while ambulating. NP notified.

## 2022-10-24 NOTE — Progress Notes (Signed)
Taxane removed because of concern for pneumonitis. She also has ongoing severe diarrhea which is poorly controlled overall. Most recent imaging with no clear evidence of residual disease. Recommended she take doxy as prescribed, will also send a medrol dose pak.  Diamond Marshall

## 2022-10-24 NOTE — Progress Notes (Addendum)
Diamond [facility] Follow up:    Diamond Claude, MD [address] [place] [address]   DIAGNOSIS:  Cancer Staging  Malignant neoplasm of upper-inner quadrant of left breast in female, estrogen receptor positive (HCC) Staging form: Breast, AJCC 8th Edition - Clinical stage from 06/21/2022: Stage IIIB (cT3, cN3, cM0, G3, ER+, PR+, HER2+) - Unsigned Stage prefix: Initial diagnosis Histologic grading system: 3 grade system Stage used in treatment planning: Yes National guidelines used in treatment planning: Yes Type of national guideline used in treatment planning: NCCN   SUMMARY OF ONCOLOGIC HISTORY: Oncology History  Malignant neoplasm of upper-inner quadrant of left breast in female, estrogen receptor positive (HCC)  06/06/2022 Mammogram   Diagnostic mammogram given palpable mass showed suspicious spiculated left breast mass with surrounding nodularity at 9 o'clock position.  This measures up to 3.1 cm sonographically however due to deep location and patient body habitus the mammographic measurement of 5.8 cm is felt to be more accurate.  At least 2 abnormal lymph nodes in the left axilla.  Possible abnormal palpable left supraclavicular lymph node.   06/06/2022 Breast US   Breast ultrasound confirmed these findings.   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  Prognostic showed ER 65% weak to strong staining, PR 15% weak to moderate staining, Ki-67 of 50% and HER2 positive by IHC at 3+    Genetic Testing   [facility] Custom Panel+RNA was Negative. Report date is 06/28/2022.   The Custom Hereditary Cancers Panel offered by [facility] includes sequencing and/or deletion duplication testing of the following 43 genes: APC, ATM, AXIN2, BAP1, BARD1, BMPR1A, BRCA1, BRCA2, BRIP1, CDH1, CDK4, CDKN2A (p14ARF and  p16INK4a only), CHEK2, CTNNA1, EPCAM (Deletion/duplication testing only), FH, GREM1 (promoter region duplication testing only), HOXB13, KIT, MBD4, MEN1, MLH1, MSH2, MSH3, MSH6, MUTYH, NF1, NHTL1, PALB2, PDGFRA, PMS2, POLD1, POLE, PTEN, RAD51C, RAD51D, SMAD4, SMARCA4. STK11, TP53, TSC1, TSC2, and VHL.    06/29/2022 PET scan   IMPRESSION: 1. Hypermetabolic left breast mass, compatible with primary breast malignancy. 2. Hypermetabolic left cervical, axillary, subpectoral, supraclavicular and prevascular mediastinal lymph nodes, compatible with nodal metastatic disease. 3. No evidence of metastatic disease in the abdomen or pelvis. 4. Aortic Atherosclerosis (ICD10-I70.0).     Electronically Signed   By: Allegra Lai M.D.   On: 06/29/2022 09:32   07/11/2022 -  Chemotherapy   Patient is on Treatment Plan : BREAST DOCEtaxel + Trastuzumab + Pertuzumab (THP) q21d x 8 cycles / Trastuzumab + Pertuzumab q21d x 4 cycles       CURRENT THERAPY: Taxotere/Herceptin/Perjeta  INTERVAL HISTORY: Diamond Marshall 52 y.o. female returns for follow-up prior to receiving Taxotere, Herceptin, Perjeta.  The 31st she was evaluated in symptom management and was found to have ambulatory oxygen saturation of 83%.  She was given IV fluids because she had had some dehydration from diarrhea and then she was sent to the emergency room.  At the emergency room she underwent a CT angiogram of her chest that demonstrated no evidence of pulmonary embolism however it noted enlarged lymph nodes in the mediastinum and groundglass densities in both lungs suggesting interstitial pneumonia.  She was given a prescription with doxycycline and was discharged.  Yuni tells me that she opted to forego taking the doxycycline because she felt her shortness of breath was more related to deconditioning  and she did not want to develop any diarrhea that can happen with the doxycycline.  She has been intentionally moving around and more active  since her ER visit.  She is feeling better today.  She denies any fevers or chills and feels like she is breathing better.  Her most recent echocardiogram occurred on Sep 29, 2022 demonstrating a left ventricular ejection fraction of 65 to 70%.  Patient Active Problem List   Diagnosis Date Noted   Gastroenteritis 09/29/2022   Port-A-Cath in place 07/07/2022   Genetic testing 06/29/2022   Malignant neoplasm of upper-inner quadrant of left breast in female, estrogen receptor positive (HCC) 06/19/2022   Chronic pain of right knee 03/27/2022   Essential hypertension 03/27/2022   Positive self-administered antigen test for COVID-19 03/15/2021   Prediabetes 07/22/2019   Anxiety and depression 03/15/2016   Vitamin D deficiency 03/15/2016   Metabolic syndrome X 08/07/2013   Elevated random blood glucose level 08/07/2013   Hypertriglyceridemia 08/07/2013   ADD (attention deficit disorder) 07/24/2013    hyperlipidemia 10/09/2012   Morbid obesity (HCC) 10/09/2012   Hypothyroidism 10/09/2012   B12 deficiency 10/09/2012   Anemia, unspecified 02/12/2011   Dysthymic disorder 02/12/2011   Migraine headache 02/12/2011   Obsessive-compulsive disorder 02/12/2011   Tinnitus 02/12/2011    is allergic to misc. sulfonamide containing compounds, phentermine, bee venom, peanut allergen powder-dnfp, topiramate, septra [sulfamethoxazole-trimethoprim], and sulfa antibiotics.  MEDICAL HISTORY: Past Medical History:  Diagnosis Date   Abdominal hernia    Anemia    b12 deficiency   Anxiety    Arthritis    B12 deficiency    Back pain    Cancer (HCC)    breast cancer   Depression    Family history of adverse reaction to anesthesia    mother vomits after anesthesia   Gallbladder problem    GERD (gastroesophageal reflux disease)    History of hiatal hernia    "small" per patient   Hypertension    Hypothyroidism    Joint pain    Knee pain    Palpitation    "when iron is low"   Pernicious anemia     SOB (shortness of breath)    Thyroid disease    hypothyroidism   Vitamin D deficiency     SURGICAL HISTORY: Past Surgical History:  Procedure Laterality Date   BREAST BIOPSY Left 06/09/2022   [place] LT BREAST BX W LOC DEV 1ST LESION IMG BX SPEC US GUIDE 06/09/2022 [facility]   CESAREAN SECTION  1999   Dilatation and currettagement     for miscarriage 18 years ago   PORTACATH PLACEMENT Right 07/05/2022   Procedure: INSERTION PORT-A-CATH;  Surgeon: Harriette Bouillon, MD;  Location: [facility] OR;  Service: General;  Laterality: Right;    SOCIAL HISTORY: Social History   Socioeconomic History   Marital status: Married    Spouse name: Kathlene November   Number of children: Not on file   Years of education: Not on file   Highest education level: Not on file  Occupational History   Occupation: [employer] copy  Tobacco Use   Smoking status: Never   Smokeless tobacco: Never  Vaping Use   Vaping Use: Never used  Substance and Sexual Activity   Alcohol use: No   Drug use: No   Sexual activity: Yes    Birth control/protection: Pill  Other Topics Concern   Not on file  Social History Narrative   Not on file   Social Determinants of Health  Financial Resource Strain: Not on file  Food Insecurity: No Food Insecurity (09/30/2022)   Hunger Vital Sign    Worried About Running Out of Food in the Last Year: Never true    Ran Out of Food in the Last Year: Never true  Transportation Needs: No Transportation Needs (09/30/2022)   PRAPARE - Administrator, Civil Service (Medical): No    Lack of Transportation (Non-Medical): No  Physical Activity: Not on file  Stress: Not on file  Social Connections: Not on file  Intimate Partner Violence: Not At Risk (09/30/2022)   Humiliation, Afraid, Rape, and Kick questionnaire    Fear of Current or Ex-Partner: No    Emotionally Abused: No    Physically Abused: No    Sexually Abused: No    FAMILY HISTORY: Family History  Problem  Relation Age of Onset   Hypertension Mother    Diabetes Mother    High Cholesterol Mother    Thyroid disease Mother    Depression Mother    Anxiety disorder Mother    Obesity Mother    CVA Father        hemorrhagic   Ovarian cancer Maternal Aunt 63   Colon cancer Maternal Aunt    Breast cancer Cousin 65       maternal first cousin, reports negative genetic testing    Review of Systems  Constitutional:  Positive for fatigue. Negative for appetite change, chills, fever and unexpected weight change.  HENT:   Negative for hearing loss, lump/mass and trouble swallowing.   Eyes:  Negative for eye problems and icterus.  Respiratory:  Negative for chest tightness, cough and shortness of breath.   Cardiovascular:  Negative for chest pain, leg swelling and palpitations.  Gastrointestinal:  Negative for abdominal distention, abdominal pain, constipation, diarrhea, nausea and vomiting.  Endocrine: Negative for hot flashes.  Genitourinary:  Negative for difficulty urinating.   Musculoskeletal:  Negative for arthralgias.  Skin:  Negative for itching and rash.  Neurological:  Negative for dizziness, extremity weakness, headaches and numbness.  Hematological:  Negative for adenopathy. Does not bruise/bleed easily.  Psychiatric/Behavioral:  Negative for depression. The patient is not nervous/anxious.       PHYSICAL EXAMINATION   Onc Performance Status - 10/24/22 1214       ECOG Perf Status   ECOG Perf Status Restricted in physically strenuous activity but ambulatory and able to carry out work of a light or sedentary nature, e.g., light house work, office work      KPS SCALE   KPS % SCORE Able to carry on normal activity, minor s/s of disease             Vitals:   10/24/22 1210 10/24/22 1229  BP: 130/73   Pulse: 100   Resp: 20   Temp: (!) 97.3 F (36.3 C)   SpO2: 97% 92%   Ambulatory oxygen saturation demonstrated O2 sat of 92% at its lowest. Physical Exam Constitutional:       General: She is not in acute distress.    Appearance: Normal appearance. She is not toxic-appearing.  HENT:     Head: Normocephalic and atraumatic.     Mouth/Throat:     Mouth: Mucous membranes are moist.     Pharynx: Oropharynx is clear. No oropharyngeal exudate or posterior oropharyngeal erythema.  Eyes:     General: No scleral icterus. Cardiovascular:     Rate and Rhythm: Normal rate and regular rhythm.     Pulses: Normal  pulses.     Heart sounds: Normal heart sounds.  Pulmonary:     Effort: Pulmonary effort is normal.     Breath sounds: Normal breath sounds.  Abdominal:     General: Abdomen is flat. Bowel sounds are normal. There is no distension.     Palpations: Abdomen is soft.     Tenderness: There is no abdominal tenderness.  Musculoskeletal:        General: No swelling.     Cervical back: Neck supple.  Lymphadenopathy:     Cervical: No cervical adenopathy.  Skin:    General: Skin is warm and dry.     Findings: No rash.  Neurological:     General: No focal deficit present.     Mental Status: She is alert.  Psychiatric:        Mood and Affect: Mood normal.        Behavior: Behavior normal.     LABORATORY DATA:  CBC    Component Value Date/Time   WBC 6.2 10/24/2022 1057   WBC 11.0 (H) 09/30/2022 0136   RBC 3.58 (L) 10/24/2022 1057   HGB 9.4 (L) 10/24/2022 1057   HGB 12.0 07/21/2021 1511   HCT 30.2 (L) 10/24/2022 1057   HCT 37.6 07/21/2021 1511   PLT 409 (H) 10/24/2022 1057   PLT 359 07/21/2021 1511   MCV 84.4 10/24/2022 1057   MCV 76 (L) 07/21/2021 1511   MCH 26.3 10/24/2022 1057   MCHC 31.1 10/24/2022 1057   RDW 19.9 (H) 10/24/2022 1057   RDW 15.7 (H) 07/21/2021 1511   LYMPHSABS 1.7 10/24/2022 1057   LYMPHSABS 1.9 07/21/2021 1511   MONOABS 0.4 10/24/2022 1057   EOSABS 0.1 10/24/2022 1057   EOSABS 0.2 07/21/2021 1511   BASOSABS 0.1 10/24/2022 1057   BASOSABS 0.1 07/21/2021 1511    CMP     Component Value Date/Time   NA 143 10/24/2022  1057   NA 139 03/28/2022 0811   K 3.8 10/24/2022 1057   CL 106 10/24/2022 1057   CO2 30 10/24/2022 1057   GLUCOSE 106 (H) 10/24/2022 1057   BUN 8 10/24/2022 1057   BUN 14 03/28/2022 0811   CREATININE 0.77 10/24/2022 1057   CREATININE 0.74 10/09/2012 1537   CALCIUM 8.8 (L) 10/24/2022 1057   PROT 6.3 (L) 10/24/2022 1057   PROT 7.1 03/28/2022 0811   ALBUMIN 3.5 10/24/2022 1057   ALBUMIN 4.2 03/28/2022 0811   AST 27 10/24/2022 1057   ALT 29 10/24/2022 1057   ALKPHOS 84 10/24/2022 1057   BILITOT 0.4 10/24/2022 1057   GFRNONAA >60 10/24/2022 1057   GFRNONAA >89 10/09/2012 1537   GFRAA 96 05/18/2020 1442   GFRAA >89 10/09/2012 1537      ASSESSMENT and THERAPY PLAN:   Malignant neoplasm of upper-inner quadrant of left breast in female, estrogen receptor positive (HCC) Diamond Marshall is a 52 year old woman with stage IIIb triple positive breast cancer on treatment with Taxotere Herceptin Perjeta.  She is due today for cycle 6 of therapy.  Given her history in the emergency room with the shortness of breath and the interstitial infiltrate seen on CT angiogram I reviewed her case with Dr. Al Pimple.  While clinically she does not have any signs of pneumonia as her white blood cells are stable, she has not had any fevers or chills, her shortness of breath has improved, and she has no further oxygen desaturation with ambulation there is concerned that the Taxotere could be causing interstitial lung issues.  Abrianna met with Dr. Al Pimple who has taken the Taxotere out of her treatment plan.  Josett will continue to take the cholestyramine and Imodium if she develops diarrhea.  She is also returning in a couple days for IV fluids.  Elona will return in 3 weeks for labs, f/u, and treatment.   All questions were answered. The patient knows to call the clinic with any problems, questions or concerns. We can certainly see the patient much sooner if necessary.  Lillard Anes, NP 10/24/22 1:40 PM Medical  Oncology and Hematology [facility] [address] [place], [place] [address] Tel. [phone]    Fax. [phone]  I saw Ms. Caryn Section today with nurse practitioner.  She was recently seen in the ER, had some imaging given hypoxia and was thought to have interstitial infiltrates which were concerning for pneumonia.  She was prescribed doxycycline but she did not take this.  She also happens to be having severe diarrhea, takes Imodium, Questran, still is poorly controlled.  In the past when we have suggested decreasing the dose or omitting the dose of chemo, she was very reluctant and did not want to change the treatment.  Her most recent imaging does not show any other concerns for active disease.  Today I have discussed with patient that docetaxel can cause pneumonitis which may also appear like interstitial abnormality on the CT and since she does not have any active evidence of metastatic breast cancer and since she has been having very severe diarrhea, I have encouraged her to omit docetaxel and move on with Herceptin and pertuzumab maintenance.  I have given her a Medrol Dosepak as well as doxycycline for the pneumonia/pneumonitis presentation.  She appears clinically well today.  Patient agreeable to this plan.  *Total Encounter Time as defined by the Centers for Medicare and Medicaid Services includes, in addition to the face-to-face time of a patient visit (documented in the note above) non-face-to-face time: obtaining and reviewing outside history, ordering and reviewing medications, tests or procedures, care coordination (communications with other health care professionals or caregivers) and documentation in the medical record.

## 2022-10-24 NOTE — Patient Instructions (Signed)
[facility]  Discharge Instructions: Thank you for choosing [facility] to provide your oncology and hematology care.   If you have a lab appointment with the Cancer Center, please go directly to the Cancer Center and check in at the registration area.   Wear comfortable clothing and clothing appropriate for easy access to any Portacath or PICC line.   We strive to give you quality time with your provider. You may need to reschedule your appointment if you arrive late (15 or more minutes).  Arriving late affects you and other patients whose appointments are after yours.  Also, if you miss three or more appointments without notifying the office, you may be dismissed from the clinic at the provider's discretion.      For prescription refill requests, have your pharmacy contact our office and allow 72 hours for refills to be completed.    Today you received the following chemotherapy and/or immunotherapy agents: Kanjinti/Perjeta   To help prevent nausea and vomiting after your treatment, we encourage you to take your nausea medication as directed.  BELOW ARE SYMPTOMS THAT SHOULD BE REPORTED IMMEDIATELY: *FEVER GREATER THAN 100.4 F (38 C) OR HIGHER *CHILLS OR SWEATING *NAUSEA AND VOMITING THAT IS NOT CONTROLLED WITH YOUR NAUSEA MEDICATION *UNUSUAL SHORTNESS OF BREATH *UNUSUAL BRUISING OR BLEEDING *URINARY PROBLEMS (pain or burning when urinating, or frequent urination) *BOWEL PROBLEMS (unusual diarrhea, constipation, pain near the anus) TENDERNESS IN MOUTH AND THROAT WITH OR WITHOUT PRESENCE OF ULCERS (sore throat, sores in mouth, or a toothache) UNUSUAL RASH, SWELLING OR PAIN  UNUSUAL VAGINAL DISCHARGE OR ITCHING   Items with * indicate a potential emergency and should be followed up as soon as possible or go to the Emergency Department if any problems should occur.  Please show the CHEMOTHERAPY ALERT CARD or IMMUNOTHERAPY ALERT CARD at  check-in to the Emergency Department and triage nurse.  Should you have questions after your visit or need to cancel or reschedule your appointment, please contact [facility]  Dept: [phone]  and follow the prompts.  Office hours are 8:00 a.m. to 4:30 p.m. Monday - Friday. Please note that voicemails left after 4:00 p.m. may not be returned until the following business day.  We are closed weekends and major holidays. You have access to a nurse at all times for urgent questions. Please call the main number to the clinic Dept: [phone] and follow the prompts.   For any non-urgent questions, you may also contact your provider using MyChart. We now offer e-Visits for anyone 18 and older to request care online for non-urgent symptoms. For details visit [email or web address].   Also download the MyChart app! Go to the app store, search "MyChart", open the app, select [facility], and log in with your MyChart username and password.   

## 2022-10-25 ENCOUNTER — Encounter: Payer: Self-pay | Admitting: Hematology and Oncology

## 2022-10-25 ENCOUNTER — Telehealth: Payer: Self-pay | Admitting: *Deleted

## 2022-10-25 NOTE — Telephone Encounter (Signed)
Pt inquired per MyChart about need to obtain undenyca tomorrow per the docetaxel has been d/ced from her treatment plan.  Pt received kanjinta and perjeta only yesterday.  Per MD growth factor can be discontinued as well.  Discussed with pt but also noted appt for IVF - discussed with plan for pt to call this RN or my chart message on her status and need for fluids- if diarrhea well controlled or not occurring ( she has had 1 stool today) may cancel hydration appt.

## 2022-10-26 ENCOUNTER — Inpatient Hospital Stay: Payer: Commercial Managed Care - PPO

## 2022-10-26 ENCOUNTER — Ambulatory Visit: Payer: Commercial Managed Care - PPO

## 2022-10-28 ENCOUNTER — Other Ambulatory Visit: Payer: Self-pay

## 2022-11-08 ENCOUNTER — Encounter: Payer: Self-pay | Admitting: Hematology and Oncology

## 2022-11-13 ENCOUNTER — Other Ambulatory Visit: Payer: Self-pay

## 2022-11-14 ENCOUNTER — Inpatient Hospital Stay (HOSPITAL_BASED_OUTPATIENT_CLINIC_OR_DEPARTMENT_OTHER): Payer: Commercial Managed Care - PPO | Admitting: Hematology and Oncology

## 2022-11-14 ENCOUNTER — Inpatient Hospital Stay: Payer: Commercial Managed Care - PPO

## 2022-11-14 ENCOUNTER — Other Ambulatory Visit: Payer: Self-pay

## 2022-11-14 ENCOUNTER — Inpatient Hospital Stay (HOSPITAL_BASED_OUTPATIENT_CLINIC_OR_DEPARTMENT_OTHER): Payer: Commercial Managed Care - PPO

## 2022-11-14 VITALS — BP 167/86 | HR 100 | Resp 20

## 2022-11-14 DIAGNOSIS — Z95828 Presence of other vascular implants and grafts: Secondary | ICD-10-CM

## 2022-11-14 DIAGNOSIS — Z170 Estrogen receptor positive status [ER+]: Secondary | ICD-10-CM | POA: Diagnosis not present

## 2022-11-14 DIAGNOSIS — C50212 Malignant neoplasm of upper-inner quadrant of left female breast: Secondary | ICD-10-CM

## 2022-11-14 DIAGNOSIS — Z5112 Encounter for antineoplastic immunotherapy: Secondary | ICD-10-CM | POA: Diagnosis not present

## 2022-11-14 LAB — CBC WITH DIFFERENTIAL (CANCER CENTER ONLY)
Abs Immature Granulocytes: 0.02 10*3/uL (ref 0.00–0.07)
Basophils Absolute: 0 10*3/uL (ref 0.0–0.1)
Basophils Relative: 1 %
Eosinophils Absolute: 0.3 10*3/uL (ref 0.0–0.5)
Eosinophils Relative: 5 %
HCT: 30.9 % — ABNORMAL LOW (ref 36.0–46.0)
Hemoglobin: 9.3 g/dL — ABNORMAL LOW (ref 12.0–15.0)
Immature Granulocytes: 0 %
Lymphocytes Relative: 24 %
Lymphs Abs: 1.6 10*3/uL (ref 0.7–4.0)
MCH: 24.6 pg — ABNORMAL LOW (ref 26.0–34.0)
MCHC: 30.1 g/dL (ref 30.0–36.0)
MCV: 81.7 fL (ref 80.0–100.0)
Monocytes Absolute: 0.3 10*3/uL (ref 0.1–1.0)
Monocytes Relative: 5 %
Neutro Abs: 4.2 10*3/uL (ref 1.7–7.7)
Neutrophils Relative %: 65 %
Platelet Count: 359 10*3/uL (ref 150–400)
RBC: 3.78 MIL/uL — ABNORMAL LOW (ref 3.87–5.11)
RDW: 16.6 % — ABNORMAL HIGH (ref 11.5–15.5)
WBC Count: 6.4 10*3/uL (ref 4.0–10.5)
nRBC: 0 % (ref 0.0–0.2)

## 2022-11-14 LAB — CMP (CANCER CENTER ONLY)
ALT: 14 U/L (ref 0–44)
AST: 17 U/L (ref 15–41)
Albumin: 3.3 g/dL — ABNORMAL LOW (ref 3.5–5.0)
Alkaline Phosphatase: 91 U/L (ref 38–126)
Anion gap: 5 (ref 5–15)
BUN: 8 mg/dL (ref 6–20)
CO2: 29 mmol/L (ref 22–32)
Calcium: 8.8 mg/dL — ABNORMAL LOW (ref 8.9–10.3)
Chloride: 109 mmol/L (ref 98–111)
Creatinine: 0.76 mg/dL (ref 0.44–1.00)
GFR, Estimated: >60 mL/min (ref >60)
Glucose, Bld: 118 mg/dL — ABNORMAL HIGH (ref 70–99)
Potassium: 3.7 mmol/L (ref 3.5–5.1)
Sodium: 143 mmol/L (ref 135–145)
Total Bilirubin: 0.3 mg/dL (ref 0.3–1.2)
Total Protein: 6.7 g/dL (ref 6.5–8.1)

## 2022-11-14 MED ORDER — DIPHENHYDRAMINE HCL 25 MG PO CAPS
50.0000 mg | ORAL_CAPSULE | Freq: Once | ORAL | Status: AC
  Administered 2022-11-14: 50 mg via ORAL
  Filled 2022-11-14: qty 2

## 2022-11-14 MED ORDER — SODIUM CHLORIDE 0.9 % IV SOLN: Freq: Once | INTRAVENOUS | Status: AC

## 2022-11-14 MED ORDER — ACETAMINOPHEN 325 MG PO TABS
650.0000 mg | ORAL_TABLET | Freq: Once | ORAL | Status: AC
  Administered 2022-11-14: 650 mg via ORAL
  Filled 2022-11-14: qty 2

## 2022-11-14 MED ORDER — TRASTUZUMAB-ANNS CHEMO 150 MG IV SOLR
6.0000 mg/kg | Freq: Once | INTRAVENOUS | Status: AC
  Administered 2022-11-14: 840 mg via INTRAVENOUS
  Filled 2022-11-14: qty 40

## 2022-11-14 MED ORDER — SODIUM CHLORIDE 0.9 % IV SOLN
420.0000 mg | Freq: Once | INTRAVENOUS | Status: AC
  Administered 2022-11-14: 420 mg via INTRAVENOUS
  Filled 2022-11-14: qty 14

## 2022-11-14 MED ORDER — HEPARIN SOD (PORK) LOCK FLUSH 100 UNIT/ML IV SOLN
500.0000 [IU] | Freq: Once | INTRAVENOUS | Status: AC | PRN
  Administered 2022-11-14: 500 [IU]

## 2022-11-14 MED ORDER — SODIUM CHLORIDE 0.9% FLUSH
10.0000 mL | INTRAVENOUS | Status: DC | PRN
  Administered 2022-11-14: 10 mL

## 2022-11-14 MED ORDER — TAMOXIFEN CITRATE 20 MG PO TABS: 20.0000 mg | ORAL_TABLET | Freq: Every day | ORAL | 3 refills | Status: DC

## 2022-11-14 MED ORDER — SODIUM CHLORIDE 0.9% FLUSH
10.0000 mL | Freq: Once | INTRAVENOUS | Status: AC
  Administered 2022-11-14: 10 mL

## 2022-11-14 NOTE — Progress Notes (Signed)
[facility] Cancer Follow up:    Diamond Claude, MD [address] [place] [address]   DIAGNOSIS:  Cancer Staging  Malignant neoplasm of upper-inner quadrant of left breast in female, estrogen receptor positive (HCC) Staging form: Breast, AJCC 8th Edition - Clinical stage from 06/21/2022: Stage IIIB (cT3, cN3, cM0, G3, ER+, PR+, HER2+) - Unsigned Stage prefix: Initial diagnosis Histologic grading system: 3 grade system Stage used in treatment planning: Yes National guidelines used in treatment planning: Yes Type of national guideline used in treatment planning: NCCN   SUMMARY OF ONCOLOGIC HISTORY: Oncology History  Malignant neoplasm of upper-inner quadrant of left breast in female, estrogen receptor positive (HCC)  06/06/2022 Mammogram   Diagnostic mammogram given palpable mass showed suspicious spiculated left breast mass with surrounding nodularity at 9 o'clock position.  This measures up to 3.1 cm sonographically however due to deep location and patient body habitus the mammographic measurement of 5.8 cm is felt to be more accurate.  At least 2 abnormal lymph nodes in the left axilla.  Possible abnormal palpable left supraclavicular lymph node.   06/06/2022 Breast US   Breast ultrasound confirmed these findings.   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  Prognostic showed ER 65% weak to strong staining, PR 15% weak to moderate staining, Ki-67 of 50% and HER2 positive by IHC at 3+    Genetic Testing   [facility] Custom Panel+RNA was Negative. Report date is 06/28/2022.   The Custom Hereditary Cancers Panel offered by [facility] includes sequencing and/or deletion duplication testing of the following 43 genes: APC, ATM, AXIN2, BAP1, BARD1, BMPR1A, BRCA1, BRCA2, BRIP1, CDH1, CDK4, CDKN2A (p14ARF and  p16INK4a only), CHEK2, CTNNA1, EPCAM (Deletion/duplication testing only), FH, GREM1 (promoter region duplication testing only), HOXB13, KIT, MBD4, MEN1, MLH1, MSH2, MSH3, MSH6, MUTYH, NF1, NHTL1, PALB2, PDGFRA, PMS2, POLD1, POLE, PTEN, RAD51C, RAD51D, SMAD4, SMARCA4. STK11, TP53, TSC1, TSC2, and VHL.    06/29/2022 PET scan   IMPRESSION: 1. Hypermetabolic left breast mass, compatible with primary breast malignancy. 2. Hypermetabolic left cervical, axillary, subpectoral, supraclavicular and prevascular mediastinal lymph nodes, compatible with nodal metastatic disease. 3. No evidence of metastatic disease in the abdomen or pelvis. 4. Aortic Atherosclerosis (ICD10-I70.0).     Electronically Signed   By: Allegra Lai M.D.   On: 06/29/2022 09:32   07/11/2022 -  Chemotherapy   Patient is on Treatment Plan : BREAST DOCEtaxel + Trastuzumab + Pertuzumab (THP) q21d x 8 cycles / Trastuzumab + Pertuzumab q21d x 4 cycles       CURRENT THERAPY: Taxotere/Herceptin/Perjeta  INTERVAL HISTORY: Diamond Marshall 52 y.o. female returns for follow-up. She is now on herceptin and perjeta. She is doing well, feeling tired. No neuropathy. She is breathing better. No cough. No fevers or chills. She is trying to walk more. No more diarrhea. No change in urinary habits No headaches, falls. Rest of the pertinent 10 point ROS reviewed and neg.  Patient Active Problem List   Diagnosis Date Noted   Gastroenteritis 09/29/2022   Port-A-Cath in place 07/07/2022   Genetic testing 06/29/2022   Malignant neoplasm of upper-inner quadrant of left breast in female, estrogen receptor positive (HCC) 06/19/2022   Chronic pain of right knee 03/27/2022   Essential hypertension 03/27/2022   Positive self-administered antigen test for COVID-19 03/15/2021   Prediabetes 07/22/2019  Anxiety and depression 03/15/2016   Vitamin D deficiency 03/15/2016   Metabolic syndrome X 08/07/2013   Elevated random blood glucose level  08/07/2013   Hypertriglyceridemia 08/07/2013   ADD (attention deficit disorder) 07/24/2013    hyperlipidemia 10/09/2012   Morbid obesity (HCC) 10/09/2012   Hypothyroidism 10/09/2012   B12 deficiency 10/09/2012   Anemia, unspecified 02/12/2011   Dysthymic disorder 02/12/2011   Migraine headache 02/12/2011   Obsessive-compulsive disorder 02/12/2011   Tinnitus 02/12/2011    is allergic to misc. sulfonamide containing compounds, phentermine, bee venom, peanut allergen powder-dnfp, topiramate, septra [sulfamethoxazole-trimethoprim], and sulfa antibiotics.  MEDICAL HISTORY: Past Medical History:  Diagnosis Date   Abdominal hernia    Anemia    b12 deficiency   Anxiety    Arthritis    B12 deficiency    Back pain    Cancer (HCC)    breast cancer   Depression    Family history of adverse reaction to anesthesia    mother vomits after anesthesia   Gallbladder problem    GERD (gastroesophageal reflux disease)    History of hiatal hernia    "small" per patient   Hypertension    Hypothyroidism    Joint pain    Knee pain    Palpitation    "when iron is low"   Pernicious anemia    SOB (shortness of breath)    Thyroid disease    hypothyroidism   Vitamin D deficiency     SURGICAL HISTORY: Past Surgical History:  Procedure Laterality Date   BREAST BIOPSY Left 06/09/2022   [place] LT BREAST BX W LOC DEV 1ST LESION IMG BX SPEC US GUIDE 06/09/2022 [facility]   CESAREAN SECTION  1999   Dilatation and currettagement     for miscarriage 18 years ago   PORTACATH PLACEMENT Right 07/05/2022   Procedure: INSERTION PORT-A-CATH;  Surgeon: Harriette Bouillon, MD;  Location: [facility] OR;  Service: General;  Laterality: Right;    SOCIAL HISTORY: Social History   Socioeconomic History   Marital status: Married    Spouse name: Kathlene November   Number of children: Not on file   Years of education: Not on file   Highest education level: Not on file  Occupational History   Occupation: Adult Pharmacologist  Tobacco Use   Smoking status: Never   Smokeless tobacco: Never  Vaping Use   Vaping Use: Never used  Substance and Sexual Activity   Alcohol use: No   Drug use: No   Sexual activity: Yes    Birth control/protection: Pill  Other Topics Concern   Not on file  Social History Narrative   Not on file   Social Determinants of Health   Financial Resource Strain: Not on file  Food Insecurity: No Food Insecurity (09/30/2022)   Hunger Vital Sign    Worried About Running Out of Food in the Last Year: Never true    Ran Out of Food in the Last Year: Never true  Transportation Needs: No Transportation Needs (09/30/2022)   PRAPARE - Administrator, Civil Service (Medical): No    Lack of Transportation (Non-Medical): No  Physical Activity: Not on file  Stress: Not on file  Social Connections: Not on file  Intimate Partner Violence: Not At Risk (09/30/2022)   Humiliation, Afraid, Rape, and Kick questionnaire    Fear of Current or Ex-Partner: No    Emotionally Abused: No    Physically Abused: No    Sexually Abused: No    FAMILY  HISTORY: Family History  Problem Relation Age of Onset   Hypertension Mother    Diabetes Mother    High Cholesterol Mother    Thyroid disease Mother    Depression Mother    Anxiety disorder Mother    Obesity Mother    CVA Father        hemorrhagic   Ovarian cancer Maternal Aunt 16   Colon cancer Maternal Aunt    Breast cancer Cousin 69       maternal first cousin, reports negative genetic testing    Review of Systems  Constitutional:  Positive for fatigue. Negative for appetite change, chills, fever and unexpected weight change.  HENT:   Negative for hearing loss, lump/mass and trouble swallowing.   Eyes:  Negative for eye problems and icterus.  Respiratory:  Negative for chest tightness, cough and shortness of breath.   Cardiovascular:  Negative for chest pain, leg swelling and palpitations.  Gastrointestinal:  Negative for  abdominal distention, abdominal pain, constipation, diarrhea, nausea and vomiting.  Endocrine: Negative for hot flashes.  Genitourinary:  Negative for difficulty urinating.   Musculoskeletal:  Negative for arthralgias.  Skin:  Negative for itching and rash.  Neurological:  Negative for dizziness, extremity weakness, headaches and numbness.  Hematological:  Negative for adenopathy. Does not bruise/bleed easily.  Psychiatric/Behavioral:  Negative for depression. The patient is not nervous/anxious.       PHYSICAL EXAMINATION     Vitals:   11/14/22 1057  BP: (!) 156/76  Pulse: 100  Resp: 18  Temp: 98.5 F (36.9 C)  SpO2: 97%    Physical Exam Constitutional:      General: She is not in acute distress.    Appearance: Normal appearance. She is not toxic-appearing.  HENT:     Head: Normocephalic and atraumatic.     Mouth/Throat:     Mouth: Mucous membranes are moist.     Pharynx: Oropharynx is clear. No oropharyngeal exudate or posterior oropharyngeal erythema.  Eyes:     General: No scleral icterus. Cardiovascular:     Rate and Rhythm: Normal rate and regular rhythm.     Pulses: Normal pulses.     Heart sounds: Normal heart sounds.  Pulmonary:     Effort: Pulmonary effort is normal.     Breath sounds: Normal breath sounds.  Abdominal:     General: Abdomen is flat. Bowel sounds are normal. There is no distension.     Palpations: Abdomen is soft.     Tenderness: There is no abdominal tenderness.  Musculoskeletal:        General: No swelling.     Cervical back: Neck supple.  Lymphadenopathy:     Cervical: No cervical adenopathy.  Skin:    General: Skin is warm and dry.     Findings: No rash.  Neurological:     General: No focal deficit present.     Mental Status: She is alert.  Psychiatric:        Mood and Affect: Mood normal.        Behavior: Behavior normal.     LABORATORY DATA:  CBC    Component Value Date/Time   WBC 6.4 11/14/2022 1029   WBC 11.0 (H)  09/30/2022 0136   RBC 3.78 (L) 11/14/2022 1029   HGB 9.3 (L) 11/14/2022 1029   HGB 12.0 07/21/2021 1511   HCT 30.9 (L) 11/14/2022 1029   HCT 37.6 07/21/2021 1511   PLT 359 11/14/2022 1029   PLT 359 07/21/2021 1511  MCV 81.7 11/14/2022 1029   MCV 76 (L) 07/21/2021 1511   MCH 24.6 (L) 11/14/2022 1029   MCHC 30.1 11/14/2022 1029   RDW 16.6 (H) 11/14/2022 1029   RDW 15.7 (H) 07/21/2021 1511   LYMPHSABS 1.6 11/14/2022 1029   LYMPHSABS 1.9 07/21/2021 1511   MONOABS 0.3 11/14/2022 1029   EOSABS 0.3 11/14/2022 1029   EOSABS 0.2 07/21/2021 1511   BASOSABS 0.0 11/14/2022 1029   BASOSABS 0.1 07/21/2021 1511    CMP     Component Value Date/Time   NA 143 10/24/2022 1057   NA 139 03/28/2022 0811   K 3.8 10/24/2022 1057   CL 106 10/24/2022 1057   CO2 30 10/24/2022 1057   GLUCOSE 106 (H) 10/24/2022 1057   BUN 8 10/24/2022 1057   BUN 14 03/28/2022 0811   CREATININE 0.77 10/24/2022 1057   CREATININE 0.74 10/09/2012 1537   CALCIUM 8.8 (L) 10/24/2022 1057   PROT 6.3 (L) 10/24/2022 1057   PROT 7.1 03/28/2022 0811   ALBUMIN 3.5 10/24/2022 1057   ALBUMIN 4.2 03/28/2022 0811   AST 27 10/24/2022 1057   ALT 29 10/24/2022 1057   ALKPHOS 84 10/24/2022 1057   BILITOT 0.4 10/24/2022 1057   GFRNONAA >60 10/24/2022 1057   GFRNONAA >89 10/09/2012 1537   GFRAA 96 05/18/2020 1442   GFRAA >89 10/09/2012 1537      ASSESSMENT and THERAPY PLAN:   Malignant neoplasm of upper-inner quadrant of left breast in female, estrogen receptor positive (HCC) 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 She felt this breast lump back in August 2023 however thought that this is likely benign and hence did not pursue a mammogram immediately.  She had her mammogram recently in January when she noticed some skin changes in that area.  She also had  some CT neck because of swelling in the left neck which was thought to be reactive lymphadenopathy although she was told that this fatty lipoma.  She also reports some dry cough but attributed this to lisinopril.  Physical examination today noticed large left breast and a quadrant mass measuring almost 6 cm in largest dimension, lateral margin not palpable, palpable regional adenopathy and palpable nonregional left lower cervical/supraclavicular adenopathy.   PET unfortunately showed mediastinal adenopathy and non regional cervical adenopathy consistent with met disease.  She started first cycle of docetaxel/Herceptin and pertuzumab with palliative intent.  She had a really tough time with chemotherapy with diarrhea, multiple visits, intravenous fluids.  After about 5 cycles since she had a great response, we omitted docetaxel and now she is on Herceptin and pertuzumab maintenance.  Since her last visit she had a couple imaging done in the ER when she went in with colitis.  This showed possible interstitial pneumonia with mildly enlarged mediastinal nodes which were not avid on the previous PET/CT and appeared to be mildly enlarged.  No evidence of disease in the abdomen.  She is doing quite well on the Herceptin and pertuzumab, no diarrhea, no neuropathy, just continues to have some fatigue.  There is no evidence of recurrence on exam.  I have reviewed her imaging as well as the previous PET/CT.  At this time I am not very suspicious of any evidence of disease progression.  We will repeat imaging end of July for follow-up.  Echo every 3 months.  No concern for cardiac  compromise on review of systems and physical examination today.  Rachel Moulds MD   All questions were answered. The patient knows to call the clinic with any problems, questions or concerns. We can certainly see the patient much sooner if necessary.*Total Encounter Time as defined by the Centers for Medicare and Medicaid Services includes,  in addition to the face-to-face time of a patient visit (documented in the note above) non-face-to-face time: obtaining and reviewing outside history, ordering and reviewing medications, tests or procedures, care coordination (communications with other health care professionals or caregivers) and documentation in the medical record.

## 2022-11-14 NOTE — Progress Notes (Signed)
ALT 14, AST 17, total bili 0.3, Scr 0.76

## 2022-11-14 NOTE — Progress Notes (Signed)
Pt did not want to stay for 30 min post observation period after treatment. She ambulated out of unit a/o and without complaint.

## 2022-11-14 NOTE — Assessment & Plan Note (Signed)
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 She felt this breast lump back in August 2023 however thought that this is likely benign and hence did not pursue a mammogram immediately.  She had her mammogram recently in January when she noticed some skin changes in that area.  She also had some CT neck because of swelling in the left neck which was thought to be reactive lymphadenopathy although she was told that this fatty lipoma.  She also reports some dry cough but attributed this to lisinopril.  Physical examination today noticed large left breast and a quadrant mass measuring almost 6 cm in largest dimension, lateral margin not palpable, palpable regional adenopathy and palpable nonregional left lower cervical/supraclavicular adenopathy.   PET unfortunately showed mediastinal adenopathy and non regional cervical adenopathy consistent with met disease.  She started first cycle of docetaxel/Herceptin and pertuzumab with palliative intent.  She had a really tough time with chemotherapy with diarrhea, multiple visits, intravenous fluids.  After about 5 cycles since she had a great response, we omitted docetaxel and now she is on Herceptin and pertuzumab maintenance.  Since her last visit she had a couple imaging done in the ER when she went in with colitis.  This showed possible interstitial pneumonia with mildly enlarged mediastinal nodes which were not avid on the previous PET/CT and appeared to be mildly enlarged.  No evidence of disease in the abdomen.  She is doing quite well on the Herceptin and pertuzumab, no diarrhea, no neuropathy, just continues to have some fatigue.  There is no evidence of recurrence on exam.  I have reviewed her imaging as well as the previous PET/CT.  At this time I am not very suspicious of any evidence of  disease progression.  We will repeat imaging end of July for follow-up.  Echo every 3 months.  No concern for cardiac compromise on review of systems and physical examination today.  Rachel Moulds MD

## 2022-11-14 NOTE — Patient Instructions (Addendum)
[facility]  Discharge Instructions: Thank you for choosing [facility] to provide your oncology and hematology care.   If you have a lab appointment with the Cancer Center, please go directly to the Cancer Center and check in at the registration area.   Wear comfortable clothing and clothing appropriate for easy access to any Portacath or PICC line.   We strive to give you quality time with your provider. You may need to reschedule your appointment if you arrive late (15 or more minutes).  Arriving late affects you and other patients whose appointments are after yours.  Also, if you miss three or more appointments without notifying the office, you may be dismissed from the clinic at the provider's discretion.      For prescription refill requests, have your pharmacy contact our office and allow 72 hours for refills to be completed.    Today you received the following chemotherapy and/or immunotherapy agents Kanjinti, Perjeta.      To help prevent nausea and vomiting after your treatment, we encourage you to take your nausea medication as directed.  BELOW ARE SYMPTOMS THAT SHOULD BE REPORTED IMMEDIATELY: *FEVER GREATER THAN 100.4 F (38 C) OR HIGHER *CHILLS OR SWEATING *NAUSEA AND VOMITING THAT IS NOT CONTROLLED WITH YOUR NAUSEA MEDICATION *UNUSUAL SHORTNESS OF BREATH *UNUSUAL BRUISING OR BLEEDING *URINARY PROBLEMS (pain or burning when urinating, or frequent urination) *BOWEL PROBLEMS (unusual diarrhea, constipation, pain near the anus) TENDERNESS IN MOUTH AND THROAT WITH OR WITHOUT PRESENCE OF ULCERS (sore throat, sores in mouth, or a toothache) UNUSUAL RASH, SWELLING OR PAIN  UNUSUAL VAGINAL DISCHARGE OR ITCHING   Items with * indicate a potential emergency and should be followed up as soon as possible or go to the Emergency Department if any problems should occur.  Please show the CHEMOTHERAPY ALERT CARD or IMMUNOTHERAPY ALERT CARD  at check-in to the Emergency Department and triage nurse.  Should you have questions after your visit or need to cancel or reschedule your appointment, please contact [facility]  Dept: [phone]  and follow the prompts.  Office hours are 8:00 a.m. to 4:30 p.m. Monday - Friday. Please note that voicemails left after 4:00 p.m. may not be returned until the following business day.  We are closed weekends and major holidays. You have access to a nurse at all times for urgent questions. Please call the main number to the clinic Dept: [phone] and follow the prompts.   For any non-urgent questions, you may also contact your provider using MyChart. We now offer e-Visits for anyone 18 and older to request care online for non-urgent symptoms. For details visit [email or web address].   Also download the MyChart app! Go to the app store, search "MyChart", open the app, select [facility], and log in with your MyChart username and password.   

## 2022-11-15 ENCOUNTER — Other Ambulatory Visit: Payer: Self-pay | Admitting: Family Medicine

## 2022-11-15 ENCOUNTER — Encounter: Payer: Self-pay | Admitting: Hematology and Oncology

## 2022-11-15 ENCOUNTER — Encounter: Payer: Self-pay | Admitting: Family Medicine

## 2022-11-15 MED ORDER — DICLOFENAC SODIUM 75 MG PO TBEC: DELAYED_RELEASE_TABLET | ORAL | 1 refills | Status: DC

## 2022-11-16 ENCOUNTER — Ambulatory Visit: Payer: Commercial Managed Care - PPO

## 2022-11-18 ENCOUNTER — Other Ambulatory Visit: Payer: Self-pay

## 2022-11-27 ENCOUNTER — Other Ambulatory Visit: Payer: Self-pay

## 2022-12-01 ENCOUNTER — Other Ambulatory Visit: Payer: Self-pay | Admitting: Family Medicine

## 2022-12-01 DIAGNOSIS — F32A Depression, unspecified: Secondary | ICD-10-CM

## 2022-12-05 ENCOUNTER — Inpatient Hospital Stay: Payer: Commercial Managed Care - PPO

## 2022-12-05 ENCOUNTER — Inpatient Hospital Stay: Payer: Commercial Managed Care - PPO | Attending: Hematology and Oncology

## 2022-12-05 ENCOUNTER — Inpatient Hospital Stay (HOSPITAL_BASED_OUTPATIENT_CLINIC_OR_DEPARTMENT_OTHER): Payer: Commercial Managed Care - PPO | Admitting: Hematology and Oncology

## 2022-12-05 ENCOUNTER — Encounter: Payer: Self-pay | Admitting: Hematology and Oncology

## 2022-12-05 DIAGNOSIS — R634 Abnormal weight loss: Secondary | ICD-10-CM | POA: Insufficient documentation

## 2022-12-05 DIAGNOSIS — Z79810 Long term (current) use of selective estrogen receptor modulators (SERMs): Secondary | ICD-10-CM | POA: Diagnosis not present

## 2022-12-05 DIAGNOSIS — C771 Secondary and unspecified malignant neoplasm of intrathoracic lymph nodes: Secondary | ICD-10-CM | POA: Insufficient documentation

## 2022-12-05 DIAGNOSIS — Z8041 Family history of malignant neoplasm of ovary: Secondary | ICD-10-CM | POA: Insufficient documentation

## 2022-12-05 DIAGNOSIS — C770 Secondary and unspecified malignant neoplasm of lymph nodes of head, face and neck: Secondary | ICD-10-CM | POA: Insufficient documentation

## 2022-12-05 DIAGNOSIS — M25561 Pain in right knee: Secondary | ICD-10-CM | POA: Insufficient documentation

## 2022-12-05 DIAGNOSIS — Z170 Estrogen receptor positive status [ER+]: Secondary | ICD-10-CM | POA: Diagnosis not present

## 2022-12-05 DIAGNOSIS — I10 Essential (primary) hypertension: Secondary | ICD-10-CM | POA: Diagnosis not present

## 2022-12-05 DIAGNOSIS — E039 Hypothyroidism, unspecified: Secondary | ICD-10-CM | POA: Insufficient documentation

## 2022-12-05 DIAGNOSIS — C50212 Malignant neoplasm of upper-inner quadrant of left female breast: Secondary | ICD-10-CM | POA: Insufficient documentation

## 2022-12-05 DIAGNOSIS — Z803 Family history of malignant neoplasm of breast: Secondary | ICD-10-CM | POA: Insufficient documentation

## 2022-12-05 DIAGNOSIS — R197 Diarrhea, unspecified: Secondary | ICD-10-CM | POA: Insufficient documentation

## 2022-12-05 DIAGNOSIS — Z95828 Presence of other vascular implants and grafts: Secondary | ICD-10-CM

## 2022-12-05 DIAGNOSIS — Z5112 Encounter for antineoplastic immunotherapy: Secondary | ICD-10-CM | POA: Diagnosis present

## 2022-12-05 DIAGNOSIS — Z800 Family history of malignant neoplasm of digestive organs: Secondary | ICD-10-CM | POA: Insufficient documentation

## 2022-12-05 DIAGNOSIS — G8929 Other chronic pain: Secondary | ICD-10-CM | POA: Diagnosis not present

## 2022-12-05 LAB — CMP (CANCER CENTER ONLY)
ALT: 18 U/L (ref 0–44)
AST: 22 U/L (ref 15–41)
Albumin: 3.7 g/dL (ref 3.5–5.0)
Alkaline Phosphatase: 93 U/L (ref 38–126)
Anion gap: 7 (ref 5–15)
BUN: 15 mg/dL (ref 6–20)
CO2: 27 mmol/L (ref 22–32)
Calcium: 9 mg/dL (ref 8.9–10.3)
Chloride: 106 mmol/L (ref 98–111)
Creatinine: 0.75 mg/dL (ref 0.44–1.00)
GFR, Estimated: >60 mL/min (ref >60)
Glucose, Bld: 93 mg/dL (ref 70–99)
Potassium: 4.3 mmol/L (ref 3.5–5.1)
Sodium: 140 mmol/L (ref 135–145)
Total Bilirubin: 0.2 mg/dL — ABNORMAL LOW (ref 0.3–1.2)
Total Protein: 6.7 g/dL (ref 6.5–8.1)

## 2022-12-05 LAB — CBC WITH DIFFERENTIAL (CANCER CENTER ONLY)
Abs Immature Granulocytes: 0.01 10*3/uL (ref 0.00–0.07)
Basophils Absolute: 0.1 10*3/uL (ref 0.0–0.1)
Basophils Relative: 1 %
Eosinophils Absolute: 0.4 10*3/uL (ref 0.0–0.5)
Eosinophils Relative: 6 %
HCT: 33.5 % — ABNORMAL LOW (ref 36.0–46.0)
Hemoglobin: 10.1 g/dL — ABNORMAL LOW (ref 12.0–15.0)
Immature Granulocytes: 0 %
Lymphocytes Relative: 24 %
Lymphs Abs: 1.5 10*3/uL (ref 0.7–4.0)
MCH: 23.5 pg — ABNORMAL LOW (ref 26.0–34.0)
MCHC: 30.1 g/dL (ref 30.0–36.0)
MCV: 77.9 fL — ABNORMAL LOW (ref 80.0–100.0)
Monocytes Absolute: 0.4 10*3/uL (ref 0.1–1.0)
Monocytes Relative: 6 %
Neutro Abs: 3.8 10*3/uL (ref 1.7–7.7)
Neutrophils Relative %: 63 %
Platelet Count: 340 10*3/uL (ref 150–400)
RBC: 4.3 MIL/uL (ref 3.87–5.11)
RDW: 16.2 % — ABNORMAL HIGH (ref 11.5–15.5)
WBC Count: 6.1 10*3/uL (ref 4.0–10.5)
nRBC: 0 % (ref 0.0–0.2)

## 2022-12-05 MED ORDER — ACETAMINOPHEN 325 MG PO TABS
650.0000 mg | ORAL_TABLET | Freq: Once | ORAL | Status: AC
  Administered 2022-12-05: 650 mg via ORAL
  Filled 2022-12-05: qty 2

## 2022-12-05 MED ORDER — DIPHENHYDRAMINE HCL 25 MG PO CAPS
50.0000 mg | ORAL_CAPSULE | Freq: Once | ORAL | Status: AC
  Administered 2022-12-05: 50 mg via ORAL
  Filled 2022-12-05: qty 2

## 2022-12-05 MED ORDER — HEPARIN SOD (PORK) LOCK FLUSH 100 UNIT/ML IV SOLN
500.0000 [IU] | Freq: Once | INTRAVENOUS | Status: AC | PRN
  Administered 2022-12-05: 500 [IU]

## 2022-12-05 MED ORDER — TRASTUZUMAB-ANNS CHEMO 150 MG IV SOLR
6.0000 mg/kg | Freq: Once | INTRAVENOUS | Status: AC
  Administered 2022-12-05: 840 mg via INTRAVENOUS
  Filled 2022-12-05: qty 40

## 2022-12-05 MED ORDER — SODIUM CHLORIDE 0.9% FLUSH
10.0000 mL | Freq: Once | INTRAVENOUS | Status: AC
  Administered 2022-12-05: 10 mL

## 2022-12-05 MED ORDER — SODIUM CHLORIDE 0.9% FLUSH
10.0000 mL | INTRAVENOUS | Status: DC | PRN
  Administered 2022-12-05: 10 mL

## 2022-12-05 MED ORDER — SODIUM CHLORIDE 0.9 % IV SOLN
420.0000 mg | Freq: Once | INTRAVENOUS | Status: AC
  Administered 2022-12-05: 420 mg via INTRAVENOUS
  Filled 2022-12-05: qty 14

## 2022-12-05 MED ORDER — SODIUM CHLORIDE 0.9 % IV SOLN: Freq: Once | INTRAVENOUS | Status: AC

## 2022-12-05 NOTE — Progress Notes (Signed)
[facility] Cancer Follow up:    Diamond Claude, MD [address] [place] [address]   DIAGNOSIS:  Cancer Staging  Malignant neoplasm of upper-inner quadrant of left breast in female, estrogen receptor positive (HCC) Staging form: Breast, AJCC 8th Edition - Clinical stage from 06/21/2022: Stage IIIB (cT3, cN3, cM0, G3, ER+, PR+, HER2+) - Unsigned Stage prefix: Initial diagnosis Histologic grading system: 3 grade system Stage used in treatment planning: Yes National guidelines used in treatment planning: Yes Type of national guideline used in treatment planning: NCCN   SUMMARY OF ONCOLOGIC HISTORY: Oncology History  Malignant neoplasm of upper-inner quadrant of left breast in female, estrogen receptor positive (HCC)  06/06/2022 Mammogram   Diagnostic mammogram given palpable mass showed suspicious spiculated left breast mass with surrounding nodularity at 9 o'clock position.  This measures up to 3.1 cm sonographically however due to deep location and patient body habitus the mammographic measurement of 5.8 cm is felt to be more accurate.  At least 2 abnormal lymph nodes in the left axilla.  Possible abnormal palpable left supraclavicular lymph node.   06/06/2022 Breast US   Breast ultrasound confirmed these findings.   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  Prognostic showed ER 65% weak to strong staining, PR 15% weak to moderate staining, Ki-67 of 50% and HER2 positive by IHC at 3+    Genetic Testing   [facility] Custom Panel+RNA was Negative. Report date is 06/28/2022.   The Custom Hereditary Cancers Panel offered by [facility] includes sequencing and/or deletion duplication testing of the following 43 genes: APC, ATM, AXIN2, BAP1, BARD1, BMPR1A, BRCA1, BRCA2, BRIP1, CDH1, CDK4, CDKN2A (p14ARF and  p16INK4a only), CHEK2, CTNNA1, EPCAM (Deletion/duplication testing only), FH, GREM1 (promoter region duplication testing only), HOXB13, KIT, MBD4, MEN1, MLH1, MSH2, MSH3, MSH6, MUTYH, NF1, NHTL1, PALB2, PDGFRA, PMS2, POLD1, POLE, PTEN, RAD51C, RAD51D, SMAD4, SMARCA4. STK11, TP53, TSC1, TSC2, and VHL.    06/29/2022 PET scan   IMPRESSION: 1. Hypermetabolic left breast mass, compatible with primary breast malignancy. 2. Hypermetabolic left cervical, axillary, subpectoral, supraclavicular and prevascular mediastinal lymph nodes, compatible with nodal metastatic disease. 3. No evidence of metastatic disease in the abdomen or pelvis. 4. Aortic Atherosclerosis (ICD10-I70.0).     Electronically Signed   By: Allegra Lai M.D.   On: 06/29/2022 09:32   07/11/2022 -  Chemotherapy   Patient is on Treatment Plan : BREAST DOCEtaxel + Trastuzumab + Pertuzumab (THP) q21d x 8 cycles / Trastuzumab + Pertuzumab q21d x 4 cycles       CURRENT THERAPY: Taxotere/Herceptin/Perjeta  INTERVAL HISTORY:  Diamond Marshall 52 y.o. female returns for follow-up. She is now on herceptin and perjeta. She says she is finally doing much better.  She still has diarrhea but wonders if it is bile acid salt diarrhea since she notices this more when she eats fatty food.  She has been watching her diet, lost some weight as well.  She is taking tamoxifen and denies any adverse effects at all.  The only change she reports today is that her nipple and areola of the left breast appears to be thickened. Rest of the pertinent 10 point ROS reviewed and neg.  Patient Active Problem List   Diagnosis Date Noted   Gastroenteritis 09/29/2022   Port-A-Cath in place 07/07/2022   Genetic testing 06/29/2022  Malignant neoplasm of upper-inner quadrant of left breast in female, estrogen receptor positive (HCC) 06/19/2022   Chronic pain of right knee 03/27/2022   Essential hypertension 03/27/2022   Positive self-administered antigen test  for COVID-19 03/15/2021   Prediabetes 07/22/2019   Anxiety and depression 03/15/2016   Vitamin D deficiency 03/15/2016   Metabolic syndrome X 08/07/2013   Elevated random blood glucose level 08/07/2013   Hypertriglyceridemia 08/07/2013   ADD (attention deficit disorder) 07/24/2013    hyperlipidemia 10/09/2012   Morbid obesity (HCC) 10/09/2012   Hypothyroidism 10/09/2012   B12 deficiency 10/09/2012   Anemia, unspecified 02/12/2011   Dysthymic disorder 02/12/2011   Migraine headache 02/12/2011   Obsessive-compulsive disorder 02/12/2011   Tinnitus 02/12/2011    is allergic to misc. sulfonamide containing compounds, phentermine, bee venom, peanut allergen powder-dnfp, topiramate, septra [sulfamethoxazole-trimethoprim], and sulfa antibiotics.  MEDICAL HISTORY: Past Medical History:  Diagnosis Date   Abdominal hernia    Anemia    b12 deficiency   Anxiety    Arthritis    B12 deficiency    Back pain    Cancer (HCC)    breast cancer   Depression    Family history of adverse reaction to anesthesia    mother vomits after anesthesia   Gallbladder problem    GERD (gastroesophageal reflux disease)    History of hiatal hernia    "small" per patient   Hypertension    Hypothyroidism    Joint pain    Knee pain    Palpitation    "when iron is low"   Pernicious anemia    SOB (shortness of breath)    Thyroid disease    hypothyroidism   Vitamin D deficiency     SURGICAL HISTORY: Past Surgical History:  Procedure Laterality Date   BREAST BIOPSY Left 06/09/2022   [place] LT BREAST BX W LOC DEV 1ST LESION IMG BX SPEC US GUIDE 06/09/2022 [facility] MAMMOGRAPHY   CESAREAN SECTION  1999   Dilatation and currettagement     for miscarriage 18 years ago   PORTACATH PLACEMENT Right 07/05/2022   Procedure: INSERTION PORT-A-CATH;  Surgeon: Harriette Bouillon, MD;  Location: [facility] OR;  Service: General;  Laterality: Right;    SOCIAL HISTORY: Social History   Socioeconomic History   Marital status:  Married    Spouse name: Kathlene November   Number of children: Not on file   Years of education: Not on file   Highest education level: Not on file  Occupational History   Occupation: [employer] copy  Tobacco Use   Smoking status: Never   Smokeless tobacco: Never  Vaping Use   Vaping status: Never Used  Substance and Sexual Activity   Alcohol use: No   Drug use: No   Sexual activity: Yes    Birth control/protection: Pill  Other Topics Concern   Not on file  Social History Narrative   Not on file   Social Determinants of Health   Financial Resource Strain: Not on file  Food Insecurity: No Food Insecurity (09/30/2022)   Hunger Vital Sign    Worried About Running Out of Food in the Last Year: Never true    Ran Out of Food in the Last Year: Never true  Transportation Needs: No Transportation Needs (09/30/2022)   PRAPARE - Administrator, Civil Service (Medical): No    Lack of Transportation (Non-Medical): No  Physical Activity: Not on file  Stress: Not on file  Social Connections: Unknown (03/28/2022)   Received from [facility]   Social Network    Social Network: Not on file  Intimate Partner Violence: Not At Risk (09/30/2022)   Humiliation, Afraid, Rape, and Kick questionnaire    Fear of Current or Ex-Partner: No    Emotionally Abused: No    Physically Abused: No    Sexually Abused: No    FAMILY HISTORY: Family History  Problem Relation Age of Onset   Hypertension Mother    Diabetes Mother    High Cholesterol Mother    Thyroid disease Mother    Depression Mother    Anxiety disorder Mother    Obesity Mother    CVA Father        hemorrhagic   Ovarian cancer Maternal Aunt 48   Colon cancer Maternal Aunt    Breast cancer Cousin 89       maternal first cousin, reports negative genetic testing    Review of Systems  Constitutional:  Negative for appetite change, chills, fatigue, fever and unexpected weight change.  HENT:   Negative for hearing loss,  lump/mass and trouble swallowing.   Eyes:  Negative for eye problems and icterus.  Respiratory:  Negative for chest tightness, cough and shortness of breath.   Cardiovascular:  Negative for chest pain, leg swelling and palpitations.  Gastrointestinal:  Negative for abdominal distention, abdominal pain, constipation, diarrhea, nausea and vomiting.  Endocrine: Negative for hot flashes.  Genitourinary:  Negative for difficulty urinating.   Musculoskeletal:  Negative for arthralgias.  Skin:  Negative for itching and rash.  Neurological:  Negative for dizziness, extremity weakness, headaches and numbness.  Hematological:  Negative for adenopathy. Does not bruise/bleed easily.  Psychiatric/Behavioral:  Negative for depression. The patient is not nervous/anxious.       PHYSICAL EXAMINATION     Vitals:   12/05/22 0932  BP: 122/73  Pulse: 88  Resp: 18  Temp: 98.8 F (37.1 C)  SpO2: 99%    Physical Exam Constitutional:      General: She is not in acute distress.    Appearance: Normal appearance. She is not toxic-appearing.  HENT:     Head: Normocephalic and atraumatic.     Mouth/Throat:     Mouth: Mucous membranes are moist.     Pharynx: Oropharynx is clear. No oropharyngeal exudate or posterior oropharyngeal erythema.  Eyes:     General: No scleral icterus. Cardiovascular:     Rate and Rhythm: Normal rate and regular rhythm.     Pulses: Normal pulses.     Heart sounds: Normal heart sounds.  Pulmonary:     Effort: Pulmonary effort is normal.     Breath sounds: Normal breath sounds.  Chest:     Comments: No palpable mass in the left breast.  The left nipple and areola are noted to have Peau D orange appearance.  However I am not entirely sure if this is a new finding. Abdominal:     General: Abdomen is flat. Bowel sounds are normal. There is no distension.     Palpations: Abdomen is soft.     Tenderness: There is no abdominal tenderness.  Musculoskeletal:        General:  No swelling.     Cervical back: Neck supple.  Lymphadenopathy:     Cervical: No cervical adenopathy.  Skin:    General: Skin is warm and dry.     Findings: No rash.  Neurological:     General: No focal deficit present.     Mental Status: She is alert.  Psychiatric:        Mood and Affect: Mood normal.        Behavior: Behavior normal.     LABORATORY DATA:  CBC    Component Value Date/Time   WBC 6.1 12/05/2022 0838   WBC 11.0 (H) 09/30/2022 0136   RBC 4.30 12/05/2022 0838   HGB 10.1 (L) 12/05/2022 0838   HGB 12.0 07/21/2021 1511   HCT 33.5 (L) 12/05/2022 0838   HCT 37.6 07/21/2021 1511   PLT 340 12/05/2022 0838   PLT 359 07/21/2021 1511   MCV 77.9 (L) 12/05/2022 0838   MCV 76 (L) 07/21/2021 1511   MCH 23.5 (L) 12/05/2022 0838   MCHC 30.1 12/05/2022 0838   RDW 16.2 (H) 12/05/2022 0838   RDW 15.7 (H) 07/21/2021 1511   LYMPHSABS 1.5 12/05/2022 0838   LYMPHSABS 1.9 07/21/2021 1511   MONOABS 0.4 12/05/2022 0838   EOSABS 0.4 12/05/2022 0838   EOSABS 0.2 07/21/2021 1511   BASOSABS 0.1 12/05/2022 0838   BASOSABS 0.1 07/21/2021 1511    CMP     Component Value Date/Time   NA 140 12/05/2022 0838   NA 139 03/28/2022 0811   K 4.3 12/05/2022 0838   CL 106 12/05/2022 0838   CO2 27 12/05/2022 0838   GLUCOSE 93 12/05/2022 0838   BUN 15 12/05/2022 0838   BUN 14 03/28/2022 0811   CREATININE 0.75 12/05/2022 0838   CREATININE 0.74 10/09/2012 1537   CALCIUM 9.0 12/05/2022 0838   PROT 6.7 12/05/2022 0838   PROT 7.1 03/28/2022 0811   ALBUMIN 3.7 12/05/2022 0838   ALBUMIN 4.2 03/28/2022 0811   AST 22 12/05/2022 0838   ALT 18 12/05/2022 0838   ALKPHOS 93 12/05/2022 0838   BILITOT 0.2 (L) 12/05/2022 0838   GFRNONAA >60 12/05/2022 0838   GFRNONAA >89 10/09/2012 1537   GFRAA 96 05/18/2020 1442   GFRAA >89 10/09/2012 1537      ASSESSMENT and THERAPY PLAN:   Malignant neoplasm of upper-inner quadrant of left breast in female, estrogen receptor positive (HCC) This is a  very pleasant 52 year old perimenopausal female patient with newly diagnosed T3N1M?  ER/PR HER2 positive invasive ductal carcinoma of the left breast referred to breast [facility] for additional recommendations. She felt this breast lump back in August 2023 however thought that this is likely benign and hence did not pursue a mammogram immediately.  She had her mammogram recently in January when she noticed some skin changes in that area.  She also had some CT neck because of swelling in the left neck which was thought to be reactive lymphadenopathy although she was told that this fatty lipoma.  Physical exam during her initial visit noticed large left breast and a quadrant mass measuring almost 6 cm in largest dimension, lateral margin not palpable, palpable regional adenopathy and palpable nonregional left lower cervical/supraclavicular adenopathy.   PET unfortunately showed mediastinal adenopathy and non regional cervical adenopathy consistent with met disease.  She started first cycle of docetaxel/Herceptin and pertuzumab with palliative intent.  She had a really tough time with chemotherapy with diarrhea, multiple visits, intravenous fluids.  After about 5 cycles since she had a great response, we omitted docetaxel and now she is on Herceptin and pertuzumab maintenance.  She is now on Herceptin, pertuzumab and tamoxifen.  She reports no new complaints except for some thickening of the left nipple and areola.  She does not have any palpable breast mass nor any regional adenopathy that can be palpated today.  Overall clinically there is no concern for progression.  However given her breast concerns, we have ordered a mammogram and ultrasound.  We have also recommended repeat imaging which is to be done end of July.  Echocardiogram in May satisfactory.  Rachel Moulds MD   All questions were answered. The patient knows to call the clinic with any problems, questions or concerns. We can certainly see the  patient much sooner if necessary.*Total Encounter Time as defined by the Centers for Medicare and Medicaid Services includes, in addition to the face-to-face time of a patient visit (documented in the note above) non-face-to-face time: obtaining and reviewing outside history, ordering and reviewing medications, tests or procedures, care coordination (communications with other health care professionals or caregivers) and documentation in the medical record.

## 2022-12-05 NOTE — Patient Instructions (Signed)
[facility]  Discharge Instructions: Thank you for choosing [facility] to provide your oncology and hematology care.   If you have a lab appointment with the Cancer Center, please go directly to the Cancer Center and check in at the registration area.   Wear comfortable clothing and clothing appropriate for easy access to any Portacath or PICC line.   We strive to give you quality time with your provider. You may need to reschedule your appointment if you arrive late (15 or more minutes).  Arriving late affects you and other patients whose appointments are after yours.  Also, if you miss three or more appointments without notifying the office, you may be dismissed from the clinic at the provider's discretion.      For prescription refill requests, have your pharmacy contact our office and allow 72 hours for refills to be completed.    Today you received the following chemotherapy and/or immunotherapy agents Kanjinti, Perjeta.      To help prevent nausea and vomiting after your treatment, we encourage you to take your nausea medication as directed.  BELOW ARE SYMPTOMS THAT SHOULD BE REPORTED IMMEDIATELY: *FEVER GREATER THAN 100.4 F (38 C) OR HIGHER *CHILLS OR SWEATING *NAUSEA AND VOMITING THAT IS NOT CONTROLLED WITH YOUR NAUSEA MEDICATION *UNUSUAL SHORTNESS OF BREATH *UNUSUAL BRUISING OR BLEEDING *URINARY PROBLEMS (pain or burning when urinating, or frequent urination) *BOWEL PROBLEMS (unusual diarrhea, constipation, pain near the anus) TENDERNESS IN MOUTH AND THROAT WITH OR WITHOUT PRESENCE OF ULCERS (sore throat, sores in mouth, or a toothache) UNUSUAL RASH, SWELLING OR PAIN  UNUSUAL VAGINAL DISCHARGE OR ITCHING   Items with * indicate a potential emergency and should be followed up as soon as possible or go to the Emergency Department if any problems should occur.  Please show the CHEMOTHERAPY ALERT CARD or IMMUNOTHERAPY ALERT CARD  at check-in to the Emergency Department and triage nurse.  Should you have questions after your visit or need to cancel or reschedule your appointment, please contact [facility]  Dept: [phone]  and follow the prompts.  Office hours are 8:00 a.m. to 4:30 p.m. Monday - Friday. Please note that voicemails left after 4:00 p.m. may not be returned until the following business day.  We are closed weekends and major holidays. You have access to a nurse at all times for urgent questions. Please call the main number to the clinic Dept: [phone] and follow the prompts.   For any non-urgent questions, you may also contact your provider using MyChart. We now offer e-Visits for anyone 18 and older to request care online for non-urgent symptoms. For details visit [email or web address].   Also download the MyChart app! Go to the app store, search "MyChart", open the app, select [facility], and log in with your MyChart username and password.   

## 2022-12-05 NOTE — Assessment & Plan Note (Addendum)
This is a very pleasant 52 year old perimenopausal female patient with newly diagnosed T3N1M?  ER/PR HER2 positive invasive ductal carcinoma of the left breast referred to breast [facility] for additional recommendations. She felt this breast lump back in August 2023 however thought that this is likely benign and hence did not pursue a mammogram immediately.  She had her mammogram recently in January when she noticed some skin changes in that area.  She also had some CT neck because of swelling in the left neck which was thought to be reactive lymphadenopathy although she was told that this fatty lipoma.  Physical exam during her initial visit noticed large left breast and a quadrant mass measuring almost 6 cm in largest dimension, lateral margin not palpable, palpable regional adenopathy and palpable nonregional left lower cervical/supraclavicular adenopathy.   PET unfortunately showed mediastinal adenopathy and non regional cervical adenopathy consistent with met disease.  She started first cycle of docetaxel/Herceptin and pertuzumab with palliative intent.  She had a really tough time with chemotherapy with diarrhea, multiple visits, intravenous fluids.  After about 5 cycles since she had a great response, we omitted docetaxel and now she is on Herceptin and pertuzumab maintenance.  She is now on Herceptin, pertuzumab and tamoxifen.  She reports no new complaints except for some thickening of the left nipple and areola.  She does not have any palpable breast mass nor any regional adenopathy that can be palpated today.  Overall clinically there is no concern for progression.  However given her breast concerns, we have ordered a mammogram and ultrasound.  We have also recommended repeat imaging which is to be done end of July.  Echocardiogram in May satisfactory.  Rachel Moulds MD

## 2022-12-07 ENCOUNTER — Ambulatory Visit: Payer: Commercial Managed Care - PPO

## 2022-12-07 ENCOUNTER — Other Ambulatory Visit: Payer: Self-pay

## 2022-12-11 ENCOUNTER — Ambulatory Visit
Admission: RE | Admit: 2022-12-11 | Discharge: 2022-12-11 | Disposition: A | Discharge status: Home / Self Care | Payer: Commercial Managed Care - PPO | Source: Ambulatory Visit | Attending: Hematology and Oncology | Admitting: Hematology and Oncology

## 2022-12-11 ENCOUNTER — Encounter: Payer: Self-pay | Admitting: Hematology and Oncology

## 2022-12-11 DIAGNOSIS — Z170 Estrogen receptor positive status [ER+]: Secondary | ICD-10-CM

## 2022-12-14 ENCOUNTER — Ambulatory Visit (HOSPITAL_COMMUNITY)
Admission: RE | Admit: 2022-12-14 | Discharge: 2022-12-14 | Disposition: A | Discharge status: Home / Self Care | Payer: Commercial Managed Care - PPO | Source: Ambulatory Visit | Attending: Hematology and Oncology | Admitting: Hematology and Oncology

## 2022-12-14 DIAGNOSIS — Z170 Estrogen receptor positive status [ER+]: Secondary | ICD-10-CM | POA: Insufficient documentation

## 2022-12-14 DIAGNOSIS — C50212 Malignant neoplasm of upper-inner quadrant of left female breast: Secondary | ICD-10-CM | POA: Diagnosis present

## 2022-12-14 MED ORDER — IOHEXOL 300 MG/ML  SOLN
100.0000 mL | Freq: Once | INTRAMUSCULAR | Status: AC | PRN
  Administered 2022-12-14: 100 mL via INTRAVENOUS

## 2022-12-14 MED ORDER — HEPARIN SOD (PORK) LOCK FLUSH 100 UNIT/ML IV SOLN
500.0000 [IU] | Freq: Once | INTRAVENOUS | Status: AC
  Administered 2022-12-14: 500 [IU] via INTRAVENOUS

## 2022-12-14 MED ORDER — HEPARIN SOD (PORK) LOCK FLUSH 100 UNIT/ML IV SOLN
INTRAVENOUS | Status: AC
  Filled 2022-12-14: qty 5

## 2022-12-14 MED ORDER — SODIUM CHLORIDE (PF) 0.9 % IJ SOLN
INTRAMUSCULAR | Status: AC
  Filled 2022-12-14: qty 50

## 2022-12-18 ENCOUNTER — Telehealth: Payer: Self-pay | Admitting: Adult Health

## 2022-12-18 NOTE — Telephone Encounter (Signed)
Per Lindsey Causey,DNP, called pt with message below. Pt verbalized understanding 

## 2022-12-18 NOTE — Telephone Encounter (Signed)
-----   Message from Noreene Filbert sent at 12/18/2022  8:40 AM EDT ----- Scans look good and do not show any metastatic disease distantly.  We can discuss in more detail on 12/26/2022. ----- Message ----- From: Interface, Rad Results In Sent: 12/15/2022   9:18 PM EDT To: Rachel Moulds, MD

## 2022-12-22 ENCOUNTER — Encounter: Payer: Self-pay | Admitting: Adult Health

## 2022-12-22 ENCOUNTER — Other Ambulatory Visit: Payer: Self-pay | Admitting: Adult Health

## 2022-12-22 MED ORDER — DOXYCYCLINE HYCLATE 100 MG PO TABS: 100.0000 mg | ORAL_TABLET | Freq: Two times a day (BID) | ORAL | 0 refills | Status: DC

## 2022-12-25 ENCOUNTER — Other Ambulatory Visit: Payer: Self-pay | Admitting: Family Medicine

## 2022-12-25 NOTE — Telephone Encounter (Signed)
Appt scheduled for 01/03/2023

## 2022-12-25 NOTE — Telephone Encounter (Signed)
Stacks NTBS last chronic FU 07/21/21 NO RFs sent to pharmacy last OV greater than a year

## 2022-12-26 ENCOUNTER — Inpatient Hospital Stay: Payer: Commercial Managed Care - PPO | Attending: Hematology and Oncology

## 2022-12-26 ENCOUNTER — Encounter: Payer: Self-pay | Admitting: Adult Health

## 2022-12-26 ENCOUNTER — Inpatient Hospital Stay: Payer: Commercial Managed Care - PPO

## 2022-12-26 ENCOUNTER — Other Ambulatory Visit: Payer: Self-pay

## 2022-12-26 ENCOUNTER — Inpatient Hospital Stay (HOSPITAL_BASED_OUTPATIENT_CLINIC_OR_DEPARTMENT_OTHER): Payer: Commercial Managed Care - PPO | Admitting: Adult Health

## 2022-12-26 VITALS — BP 143/68 | HR 74 | Resp 16

## 2022-12-26 DIAGNOSIS — Z803 Family history of malignant neoplasm of breast: Secondary | ICD-10-CM | POA: Diagnosis not present

## 2022-12-26 DIAGNOSIS — Z800 Family history of malignant neoplasm of digestive organs: Secondary | ICD-10-CM | POA: Insufficient documentation

## 2022-12-26 DIAGNOSIS — C50212 Malignant neoplasm of upper-inner quadrant of left female breast: Secondary | ICD-10-CM | POA: Diagnosis present

## 2022-12-26 DIAGNOSIS — I10 Essential (primary) hypertension: Secondary | ICD-10-CM | POA: Insufficient documentation

## 2022-12-26 DIAGNOSIS — Z95828 Presence of other vascular implants and grafts: Secondary | ICD-10-CM

## 2022-12-26 DIAGNOSIS — M25561 Pain in right knee: Secondary | ICD-10-CM | POA: Insufficient documentation

## 2022-12-26 DIAGNOSIS — Z5112 Encounter for antineoplastic immunotherapy: Secondary | ICD-10-CM | POA: Diagnosis present

## 2022-12-26 DIAGNOSIS — C771 Secondary and unspecified malignant neoplasm of intrathoracic lymph nodes: Secondary | ICD-10-CM | POA: Insufficient documentation

## 2022-12-26 DIAGNOSIS — G8929 Other chronic pain: Secondary | ICD-10-CM | POA: Insufficient documentation

## 2022-12-26 DIAGNOSIS — Z170 Estrogen receptor positive status [ER+]: Secondary | ICD-10-CM | POA: Insufficient documentation

## 2022-12-26 DIAGNOSIS — Z8041 Family history of malignant neoplasm of ovary: Secondary | ICD-10-CM | POA: Insufficient documentation

## 2022-12-26 DIAGNOSIS — E039 Hypothyroidism, unspecified: Secondary | ICD-10-CM | POA: Diagnosis not present

## 2022-12-26 DIAGNOSIS — Z79810 Long term (current) use of selective estrogen receptor modulators (SERMs): Secondary | ICD-10-CM | POA: Diagnosis not present

## 2022-12-26 DIAGNOSIS — C770 Secondary and unspecified malignant neoplasm of lymph nodes of head, face and neck: Secondary | ICD-10-CM | POA: Insufficient documentation

## 2022-12-26 LAB — CMP (CANCER CENTER ONLY)
ALT: 13 U/L (ref 0–44)
AST: 18 U/L (ref 15–41)
Albumin: 3.7 g/dL (ref 3.5–5.0)
Alkaline Phosphatase: 102 U/L (ref 38–126)
Anion gap: 7 (ref 5–15)
BUN: 10 mg/dL (ref 6–20)
CO2: 27 mmol/L (ref 22–32)
Calcium: 8.8 mg/dL — ABNORMAL LOW (ref 8.9–10.3)
Chloride: 106 mmol/L (ref 98–111)
Creatinine: 0.78 mg/dL (ref 0.44–1.00)
GFR, Estimated: >60 mL/min (ref >60)
Glucose, Bld: 114 mg/dL — ABNORMAL HIGH (ref 70–99)
Potassium: 4 mmol/L (ref 3.5–5.1)
Sodium: 140 mmol/L (ref 135–145)
Total Bilirubin: 0.3 mg/dL (ref 0.3–1.2)
Total Protein: 6.8 g/dL (ref 6.5–8.1)

## 2022-12-26 LAB — CBC WITH DIFFERENTIAL (CANCER CENTER ONLY)
Abs Immature Granulocytes: 0.01 10*3/uL (ref 0.00–0.07)
Basophils Absolute: 0 10*3/uL (ref 0.0–0.1)
Basophils Relative: 1 %
Eosinophils Absolute: 0.6 10*3/uL — ABNORMAL HIGH (ref 0.0–0.5)
Eosinophils Relative: 10 %
HCT: 32.3 % — ABNORMAL LOW (ref 36.0–46.0)
Hemoglobin: 10.1 g/dL — ABNORMAL LOW (ref 12.0–15.0)
Immature Granulocytes: 0 %
Lymphocytes Relative: 22 %
Lymphs Abs: 1.5 10*3/uL (ref 0.7–4.0)
MCH: 23.5 pg — ABNORMAL LOW (ref 26.0–34.0)
MCHC: 31.3 g/dL (ref 30.0–36.0)
MCV: 75.1 fL — ABNORMAL LOW (ref 80.0–100.0)
Monocytes Absolute: 0.4 10*3/uL (ref 0.1–1.0)
Monocytes Relative: 6 %
Neutro Abs: 4 10*3/uL (ref 1.7–7.7)
Neutrophils Relative %: 61 %
Platelet Count: 324 10*3/uL (ref 150–400)
RBC: 4.3 MIL/uL (ref 3.87–5.11)
RDW: 16.1 % — ABNORMAL HIGH (ref 11.5–15.5)
WBC Count: 6.5 10*3/uL (ref 4.0–10.5)
nRBC: 0 % (ref 0.0–0.2)

## 2022-12-26 MED ORDER — SODIUM CHLORIDE 0.9% FLUSH
10.0000 mL | INTRAVENOUS | Status: DC | PRN
  Administered 2022-12-26: 10 mL

## 2022-12-26 MED ORDER — SODIUM CHLORIDE 0.9 % IV SOLN: Freq: Once | INTRAVENOUS | Status: AC

## 2022-12-26 MED ORDER — HEPARIN SOD (PORK) LOCK FLUSH 100 UNIT/ML IV SOLN
500.0000 [IU] | Freq: Once | INTRAVENOUS | Status: AC | PRN
  Administered 2022-12-26: 500 [IU]

## 2022-12-26 MED ORDER — SODIUM CHLORIDE 0.9% FLUSH
10.0000 mL | Freq: Once | INTRAVENOUS | Status: AC
  Administered 2022-12-26: 10 mL

## 2022-12-26 MED ORDER — ACETAMINOPHEN 325 MG PO TABS
650.0000 mg | ORAL_TABLET | Freq: Once | ORAL | Status: AC
  Administered 2022-12-26: 650 mg via ORAL
  Filled 2022-12-26: qty 2

## 2022-12-26 MED ORDER — DIPHENHYDRAMINE HCL 25 MG PO CAPS
50.0000 mg | ORAL_CAPSULE | Freq: Once | ORAL | Status: AC
  Administered 2022-12-26: 50 mg via ORAL
  Filled 2022-12-26: qty 2

## 2022-12-26 MED ORDER — TRASTUZUMAB-ANNS CHEMO 150 MG IV SOLR
6.0000 mg/kg | Freq: Once | INTRAVENOUS | Status: AC
  Administered 2022-12-26: 840 mg via INTRAVENOUS
  Filled 2022-12-26: qty 40

## 2022-12-26 MED ORDER — SODIUM CHLORIDE 0.9 % IV SOLN
420.0000 mg | Freq: Once | INTRAVENOUS | Status: AC
  Administered 2022-12-26: 420 mg via INTRAVENOUS
  Filled 2022-12-26: qty 14

## 2022-12-26 NOTE — Patient Instructions (Signed)
[facility]  Discharge Instructions: Thank you for choosing [facility] to provide your oncology and hematology care.   If you have a lab appointment with the Cancer Center, please go directly to the Cancer Center and check in at the registration area.   Wear comfortable clothing and clothing appropriate for easy access to any Portacath or PICC line.   We strive to give you quality time with your provider. You may need to reschedule your appointment if you arrive late (15 or more minutes).  Arriving late affects you and other patients whose appointments are after yours.  Also, if you miss three or more appointments without notifying the office, you may be dismissed from the clinic at the provider's discretion.      For prescription refill requests, have your pharmacy contact our office and allow 72 hours for refills to be completed.    Today you received the following chemotherapy and/or immunotherapy agents: Trastuzumab, Pertuzumab.      To help prevent nausea and vomiting after your treatment, we encourage you to take your nausea medication as directed.  BELOW ARE SYMPTOMS THAT SHOULD BE REPORTED IMMEDIATELY: *FEVER GREATER THAN 100.4 F (38 C) OR HIGHER *CHILLS OR SWEATING *NAUSEA AND VOMITING THAT IS NOT CONTROLLED WITH YOUR NAUSEA MEDICATION *UNUSUAL SHORTNESS OF BREATH *UNUSUAL BRUISING OR BLEEDING *URINARY PROBLEMS (pain or burning when urinating, or frequent urination) *BOWEL PROBLEMS (unusual diarrhea, constipation, pain near the anus) TENDERNESS IN MOUTH AND THROAT WITH OR WITHOUT PRESENCE OF ULCERS (sore throat, sores in mouth, or a toothache) UNUSUAL RASH, SWELLING OR PAIN  UNUSUAL VAGINAL DISCHARGE OR ITCHING   Items with * indicate a potential emergency and should be followed up as soon as possible or go to the Emergency Department if any problems should occur.  Please show the CHEMOTHERAPY ALERT CARD or IMMUNOTHERAPY  ALERT CARD at check-in to the Emergency Department and triage nurse.  Should you have questions after your visit or need to cancel or reschedule your appointment, please contact [facility]  Dept: [phone]  and follow the prompts.  Office hours are 8:00 a.m. to 4:30 p.m. Monday - Friday. Please note that voicemails left after 4:00 p.m. may not be returned until the following business day.  We are closed weekends and major holidays. You have access to a nurse at all times for urgent questions. Please call the main number to the clinic Dept: [phone] and follow the prompts.   For any non-urgent questions, you may also contact your provider using MyChart. We now offer e-Visits for anyone 18 and older to request care online for non-urgent symptoms. For details visit [email or web address].   Also download the MyChart app! Go to the app store, search "MyChart", open the app, select [facility], and log in with your MyChart username and password.  

## 2022-12-26 NOTE — Progress Notes (Signed)
[facility] Cancer Follow up:    Diamond Claude, MD [address] [place] [address]   DIAGNOSIS:  Cancer Staging  Malignant neoplasm of upper-inner quadrant of left breast in female, estrogen receptor positive (HCC) Staging form: Breast, AJCC 8th Edition - Clinical stage from 06/21/2022: Stage IIIB (cT3, cN3, cM0, G3, ER+, PR+, HER2+) - Unsigned Stage prefix: Initial diagnosis Histologic grading system: 3 grade system Stage used in treatment planning: Yes National guidelines used in treatment planning: Yes Type of national guideline used in treatment planning: NCCN   SUMMARY OF ONCOLOGIC HISTORY: Oncology History  Malignant neoplasm of upper-inner quadrant of left breast in female, estrogen receptor positive (HCC)  06/06/2022 Mammogram   Diagnostic mammogram given palpable mass showed suspicious spiculated left breast mass with surrounding nodularity at 9 o'clock position.  This measures up to 3.1 cm sonographically however due to deep location and patient body habitus the mammographic measurement of 5.8 cm is felt to be more accurate.  At least 2 abnormal lymph nodes in the left axilla.  Possible abnormal palpable left supraclavicular lymph node.   06/06/2022 Breast US   Breast ultrasound confirmed these findings.   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  Prognostic showed ER 65% weak to strong staining, PR 15% weak to moderate staining, Ki-67 of 50% and HER2 positive by IHC at 3+    Genetic Testing   [facility] Custom Panel+RNA was Negative. Report date is 06/28/2022.   The Custom Hereditary Cancers Panel offered by [facility] includes sequencing and/or deletion duplication testing of the following 43 genes: APC, ATM, AXIN2, BAP1, BARD1, BMPR1A, BRCA1, BRCA2, BRIP1, CDH1, CDK4, CDKN2A (p14ARF and  p16INK4a only), CHEK2, CTNNA1, EPCAM (Deletion/duplication testing only), FH, GREM1 (promoter region duplication testing only), HOXB13, KIT, MBD4, MEN1, MLH1, MSH2, MSH3, MSH6, MUTYH, NF1, NHTL1, PALB2, PDGFRA, PMS2, POLD1, POLE, PTEN, RAD51C, RAD51D, SMAD4, SMARCA4. STK11, TP53, TSC1, TSC2, and VHL.    06/29/2022 PET scan   IMPRESSION: 1. Hypermetabolic left breast mass, compatible with primary breast malignancy. 2. Hypermetabolic left cervical, axillary, subpectoral, supraclavicular and prevascular mediastinal lymph nodes, compatible with nodal metastatic disease. 3. No evidence of metastatic disease in the abdomen or pelvis. 4. Aortic Atherosclerosis (ICD10-I70.0).     Electronically Signed   By: Allegra Lai M.D.   On: 06/29/2022 09:32   07/11/2022 -  Chemotherapy   Patient is on Treatment Plan : BREAST DOCEtaxel + Trastuzumab + Pertuzumab (THP) q21d x 8 cycles / Trastuzumab + Pertuzumab q21d x 4 cycles       CURRENT THERAPY: Tamoxifen, Herceptin, Perjeta  INTERVAL HISTORY: Diamond Marshall 52 y.o. female returns for follow-up prior to receiving her Herceptin and Perjeta.  She continues on tamoxifen daily with good tolerance.  She denies any side effects.  She notes her left breast has continued to be red and swollen.  .  This was evaluated with mammogram and ultrasound on December 11, 2022 which demonstrated likely subclinical infection and inflammation from chemo.  Mammographically her breast cancer mass was noted to be significantly decreased. She was prescribed a 10-day course of Augmentin it initially improved but then worsened towards the end.  She began doxycycline 3 days ago.  She is not sure if the breast has significantly improved since that time.  Her most recent echocardiogram occurred on Sep 29, 2022 demonstrating  a left ventricular ejection fraction of 65 to 70%.  She underwent CT chest abdomen pelvis on December 14, 2022 demonstrating left breast skin thickening that was  unchanged with treated mass in the deep central left breast, unchanged asymmetrically prominent left axillary lymph nodes and prominent subcentimeter mediastinal lymph nodes consistent with treated nodal metastatic disease.  No evidence of lymphadenopathy or metastatic disease in the abdomen or pelvis, mild splenomegaly diverticulosis and a suggestion of small airway disease.      Patient Active Problem List   Diagnosis Date Noted   Gastroenteritis 09/29/2022   Port-A-Cath in place 07/07/2022   Genetic testing 06/29/2022   Malignant neoplasm of upper-inner quadrant of left breast in female, estrogen receptor positive (HCC) 06/19/2022   Chronic pain of right knee 03/27/2022   Essential hypertension 03/27/2022   Positive self-administered antigen test for COVID-19 03/15/2021   Prediabetes 07/22/2019   Anxiety and depression 03/15/2016   Vitamin D deficiency 03/15/2016   Metabolic syndrome X 08/07/2013   Elevated random blood glucose level 08/07/2013   Hypertriglyceridemia 08/07/2013   ADD (attention deficit disorder) 07/24/2013    hyperlipidemia 10/09/2012   Morbid obesity (HCC) 10/09/2012   Hypothyroidism 10/09/2012   B12 deficiency 10/09/2012   Anemia, unspecified 02/12/2011   Dysthymic disorder 02/12/2011   Migraine headache 02/12/2011   Obsessive-compulsive disorder 02/12/2011   Tinnitus 02/12/2011    is allergic to misc. sulfonamide containing compounds, phentermine, bee venom, peanut allergen powder-dnfp, topiramate, septra [sulfamethoxazole-trimethoprim], and sulfa antibiotics.  MEDICAL HISTORY: Past Medical History:  Diagnosis Date   Abdominal hernia    Anemia    b12 deficiency   Anxiety    Arthritis    B12 deficiency    Back pain    Cancer (HCC)    breast cancer   Depression    Family history of adverse reaction to anesthesia    mother vomits after anesthesia   Gallbladder problem    GERD (gastroesophageal reflux disease)    History of hiatal hernia     "small" per patient   Hypertension    Hypothyroidism    Joint pain    Knee pain    Palpitation    "when iron is low"   Pernicious anemia    SOB (shortness of breath)    Thyroid disease    hypothyroidism   Vitamin D deficiency     SURGICAL HISTORY: Past Surgical History:  Procedure Laterality Date   BREAST BIOPSY Left 06/09/2022   [place] LT BREAST BX W LOC DEV 1ST LESION IMG BX SPEC US GUIDE 06/09/2022 [facility] MAMMOGRAPHY   CESAREAN SECTION  1999   Dilatation and currettagement     for miscarriage 18 years ago   PORTACATH PLACEMENT Right 07/05/2022   Procedure: INSERTION PORT-A-CATH;  Surgeon: Harriette Bouillon, MD;  Location: [facility] OR;  Service: General;  Laterality: Right;    SOCIAL HISTORY: Social History   Socioeconomic History   Marital status: Married    Spouse name: Kathlene November   Number of children: Not on file   Years of education: Not on file   Highest education level: Not on file  Occupational History   Occupation: [employer] copy  Tobacco Use   Smoking status: Never   Smokeless tobacco: Never  Vaping Use   Vaping status: Never Used  Substance and Sexual Activity   Alcohol use: No   Drug use: No   Sexual activity: Yes    Birth control/protection: Pill  Other Topics Concern   Not on file  Social  History Narrative   Not on file   Social Determinants of Health   Financial Resource Strain: Not on file  Food Insecurity: No Food Insecurity (09/30/2022)   Hunger Vital Sign    Worried About Running Out of Food in the Last Year: Never true    Ran Out of Food in the Last Year: Never true  Transportation Needs: No Transportation Needs (09/30/2022)   PRAPARE - Administrator, Civil Service (Medical): No    Lack of Transportation (Non-Medical): No  Physical Activity: Not on file  Stress: Not on file  Social Connections: Unknown (03/28/2022)   Received from [facility]   Social Network    Social Network: Not on file  Intimate Partner Violence: Not At  Risk (09/30/2022)   Humiliation, Afraid, Rape, and Kick questionnaire    Fear of Current or Ex-Partner: No    Emotionally Abused: No    Physically Abused: No    Sexually Abused: No    FAMILY HISTORY: Family History  Problem Relation Age of Onset   Hypertension Mother    Diabetes Mother    High Cholesterol Mother    Thyroid disease Mother    Depression Mother    Anxiety disorder Mother    Obesity Mother    CVA Father        hemorrhagic   Ovarian cancer Maternal Aunt 80   Colon cancer Maternal Aunt    Breast cancer Cousin 65       maternal first cousin, reports negative genetic testing    Review of Systems  Constitutional:  Positive for fatigue. Negative for appetite change, chills, fever and unexpected weight change.  HENT:   Negative for hearing loss, lump/mass and trouble swallowing.   Eyes:  Negative for eye problems and icterus.  Respiratory:  Negative for chest tightness, cough and shortness of breath.   Cardiovascular:  Negative for chest pain, leg swelling and palpitations.  Gastrointestinal:  Negative for abdominal distention, abdominal pain, constipation, diarrhea, nausea and vomiting.  Endocrine: Negative for hot flashes.  Genitourinary:  Negative for difficulty urinating.   Musculoskeletal:  Negative for arthralgias.  Skin:  Negative for itching and rash.  Neurological:  Negative for dizziness, extremity weakness, headaches and numbness.  Hematological:  Negative for adenopathy. Does not bruise/bleed easily.  Psychiatric/Behavioral:  Negative for depression. The patient is not nervous/anxious.       PHYSICAL EXAMINATION   Onc Performance Status - 12/26/22 0948       ECOG Perf Status   ECOG Perf Status Restricted in physically strenuous activity but ambulatory and able to carry out work of a light or sedentary nature, e.g., light house work, office work      KPS SCALE   KPS % SCORE Able to carry on normal activity, minor s/s of disease              Vitals:   12/26/22 0927  BP: (!) 140/60  Pulse: 84  Resp: 19  Temp: 97.8 F (36.6 C)  SpO2: 96%    Physical Exam Constitutional:      General: She is not in acute distress.    Appearance: Normal appearance. She is not toxic-appearing.  HENT:     Head: Normocephalic and atraumatic.     Mouth/Throat:     Mouth: Mucous membranes are moist.     Pharynx: Oropharynx is clear. No oropharyngeal exudate or posterior oropharyngeal erythema.  Eyes:     General: No scleral icterus. Cardiovascular:  Rate and Rhythm: Normal rate and regular rhythm.     Pulses: Normal pulses.     Heart sounds: Normal heart sounds.  Pulmonary:     Effort: Pulmonary effort is normal.     Breath sounds: Normal breath sounds.  Chest:     Comments: Left breast with skin thickness around the areola.  There is slight erythema with this.  There is no palpable mass or nodule around this area. Abdominal:     General: Abdomen is flat. Bowel sounds are normal. There is no distension.     Palpations: Abdomen is soft.     Tenderness: There is no abdominal tenderness.  Musculoskeletal:        General: No swelling.     Cervical back: Neck supple.  Lymphadenopathy:     Cervical: No cervical adenopathy.  Skin:    General: Skin is warm and dry.     Findings: No rash.  Neurological:     General: No focal deficit present.     Mental Status: She is alert.  Psychiatric:        Mood and Affect: Mood normal.        Behavior: Behavior normal.     LABORATORY DATA:  CBC    Component Value Date/Time   WBC 6.5 12/26/2022 0854   WBC 11.0 (H) 09/30/2022 0136   RBC 4.30 12/26/2022 0854   HGB 10.1 (L) 12/26/2022 0854   HGB 12.0 07/21/2021 1511   HCT 32.3 (L) 12/26/2022 0854   HCT 37.6 07/21/2021 1511   PLT 324 12/26/2022 0854   PLT 359 07/21/2021 1511   MCV 75.1 (L) 12/26/2022 0854   MCV 76 (L) 07/21/2021 1511   MCH 23.5 (L) 12/26/2022 0854   MCHC 31.3 12/26/2022 0854   RDW 16.1 (H) 12/26/2022 0854    RDW 15.7 (H) 07/21/2021 1511   LYMPHSABS 1.5 12/26/2022 0854   LYMPHSABS 1.9 07/21/2021 1511   MONOABS 0.4 12/26/2022 0854   EOSABS 0.6 (H) 12/26/2022 0854   EOSABS 0.2 07/21/2021 1511   BASOSABS 0.0 12/26/2022 0854   BASOSABS 0.1 07/21/2021 1511    CMP     Component Value Date/Time   NA 140 12/26/2022 0854   NA 139 03/28/2022 0811   K 4.0 12/26/2022 0854   CL 106 12/26/2022 0854   CO2 27 12/26/2022 0854   GLUCOSE 114 (H) 12/26/2022 0854   BUN 10 12/26/2022 0854   BUN 14 03/28/2022 0811   CREATININE 0.78 12/26/2022 0854   CREATININE 0.74 10/09/2012 1537   CALCIUM 8.8 (L) 12/26/2022 0854   PROT 6.8 12/26/2022 0854   PROT 7.1 03/28/2022 0811   ALBUMIN 3.7 12/26/2022 0854   ALBUMIN 4.2 03/28/2022 0811   AST 18 12/26/2022 0854   ALT 13 12/26/2022 0854   ALKPHOS 102 12/26/2022 0854   BILITOT 0.3 12/26/2022 0854   GFRNONAA >60 12/26/2022 0854   GFRNONAA >89 10/09/2012 1537   GFRAA 96 05/18/2020 1442   GFRAA >89 10/09/2012 1537        ASSESSMENT and THERAPY PLAN:   Malignant neoplasm of upper-inner quadrant of left breast in female, estrogen receptor positive (HCC) Diamond Marshall is a 52 year old woman with stage 4 triple positive breast cancer on treatment with tamoxifen and Herceptin Perjeta.    Stage IV breast cancer: She continues on tamoxifen and Herceptin Perjeta with good tolerance.  She will continue this.  Her most recent restaging scans indicated a clinical response to treatment. Left breast thickness and erythema: She has completed a  10-day course of Augmentin and is now taking doxycycline.  I sent a message to Dr. Luisa Hart and his nurse Brennan Bailey to get her on the schedule so that he can evaluate her and see if she needs a skin biopsy. Iron deficiency: She has restarted her oral iron and is able to tolerate this.  She does have a microcytic anemia that is compatible with an iron deficiency anemia.  Will hold off on iron studies so long as her microcytosis is  improving and she is tolerating the oral iron well.  Diamond Marshall will proceed with treatment today.  We reviewed her labs in detail.  I placed orders for her next echocardiogram and she will return every 3 weeks for Herceptin Perjeta.   All questions were answered. The patient knows to call the clinic with any problems, questions or concerns. We can certainly see the patient much sooner if necessary.  Total encounter time:30 minutes*in face-to-face visit time, chart review, lab review, care coordination, order entry, and documentation of the encounter time.    Lillard Anes, NP 12/26/22 10:33 AM Medical Oncology and Hematology [facility] [address] [place], [place] [address] Tel. [phone]    Fax. [phone]  *Total Encounter Time as defined by the Centers for Medicare and Medicaid Services includes, in addition to the face-to-face time of a patient visit (documented in the note above) non-face-to-face time: obtaining and reviewing outside history, ordering and reviewing medications, tests or procedures, care coordination (communications with other health care professionals or caregivers) and documentation in the medical record.

## 2022-12-26 NOTE — Assessment & Plan Note (Signed)
Diamond Marshall is a 52 year old woman with stage 4 triple positive breast cancer on treatment with tamoxifen and Herceptin Perjeta.    Stage IV breast cancer: She continues on tamoxifen and Herceptin Perjeta with good tolerance.  She will continue this.  Her most recent restaging scans indicated a clinical response to treatment. Left breast thickness and erythema: She has completed a 10-day course of Augmentin and is now taking doxycycline.  I sent a message to Dr. Luisa Hart and his nurse Brennan Bailey to get her on the schedule so that he can evaluate her and see if she needs a skin biopsy. Iron deficiency: She has restarted her oral iron and is able to tolerate this.  She does have a microcytic anemia that is compatible with an iron deficiency anemia.  Will hold off on iron studies so long as her microcytosis is improving and she is tolerating the oral iron well.  Kynsli will proceed with treatment today.  We reviewed her labs in detail.  I placed orders for her next echocardiogram and she will return every 3 weeks for Herceptin Perjeta.

## 2022-12-27 ENCOUNTER — Encounter: Payer: Self-pay | Admitting: Adult Health

## 2023-01-01 ENCOUNTER — Encounter: Payer: Self-pay | Admitting: Adult Health

## 2023-01-01 ENCOUNTER — Ambulatory Visit (HOSPITAL_COMMUNITY): Payer: Commercial Managed Care - PPO

## 2023-01-02 ENCOUNTER — Other Ambulatory Visit: Payer: Self-pay

## 2023-01-03 ENCOUNTER — Ambulatory Visit (INDEPENDENT_AMBULATORY_CARE_PROVIDER_SITE_OTHER): Payer: Commercial Managed Care - PPO | Admitting: Family Medicine

## 2023-01-03 ENCOUNTER — Encounter: Payer: Self-pay | Admitting: Family Medicine

## 2023-01-03 VITALS — BP 119/77 | HR 94 | Temp 97.1°F | Ht 63.0 in | Wt 321.2 lb

## 2023-01-03 DIAGNOSIS — K2100 Gastro-esophageal reflux disease with esophagitis, without bleeding: Secondary | ICD-10-CM

## 2023-01-03 DIAGNOSIS — C50212 Malignant neoplasm of upper-inner quadrant of left female breast: Secondary | ICD-10-CM

## 2023-01-03 DIAGNOSIS — M1710 Unilateral primary osteoarthritis, unspecified knee: Secondary | ICD-10-CM

## 2023-01-03 DIAGNOSIS — F419 Anxiety disorder, unspecified: Secondary | ICD-10-CM | POA: Diagnosis not present

## 2023-01-03 DIAGNOSIS — F32A Depression, unspecified: Secondary | ICD-10-CM

## 2023-01-03 DIAGNOSIS — Z170 Estrogen receptor positive status [ER+]: Secondary | ICD-10-CM

## 2023-01-03 MED ORDER — LISINOPRIL 20 MG PO TABS: 20.0000 mg | ORAL_TABLET | Freq: Every day | ORAL | 1 refills | Status: DC

## 2023-01-03 MED ORDER — ESCITALOPRAM OXALATE 20 MG PO TABS
20.0000 mg | ORAL_TABLET | Freq: Every day | ORAL | 3 refills | Status: DC
Start: 2023-01-03 — End: 2023-08-06

## 2023-01-03 MED ORDER — PANTOPRAZOLE SODIUM 40 MG PO TBEC: 40.0000 mg | DELAYED_RELEASE_TABLET | Freq: Every day | ORAL | 3 refills | Status: DC

## 2023-01-03 MED ORDER — RIFAXIMIN 200 MG PO TABS: 200.0000 mg | ORAL_TABLET | Freq: Three times a day (TID) | ORAL | 5 refills | Status: DC

## 2023-01-03 NOTE — Progress Notes (Signed)
Subjective:  Patient ID: Diamond Marshall, female    DOB: July 19, 1970  Age: 52 y.o. MRN: [id number]  CC: Medical Management of Chronic Issues   HPI Diamond Marshall presents for follow up of cancer related issues and her anxiety, depression as well as GERD. Marland Kitchen  Patient in for follow-up of GERD. Currently asymptomatic taking  PPI daily. There is no chest pain or heartburn. No hematemesis and no melena. No dysphagia or choking. Onset is remote. Progression is stable. Complicating factors, none.  Having diarrhea due to chemo. Ten watery episodes 2 days ago. 7 yesterday. Taking cholestyramine prn. Denies significant nausea, but risk is there so has active scrips for that. Knee pain persists, so she continues to get relief with diclofenac.   Handling cancer tx well with regard to depression. "Just have to have a little cry every now and then."    01/03/2023    4:16 PM 04/28/2022    2:50 PM 12/16/2021    8:46 AM 08/10/2020    3:21 PM  GAD 7 : Generalized Anxiety Score  Nervous, Anxious, on Edge 1 0 0 0  Control/stop worrying 1 0 0 0  Worry too much - different things 0 0 0 0  Trouble relaxing 0 0 0 0  Restless 0 0 0 0  Easily annoyed or irritable 0 0 0 0  Afraid - awful might happen 1 0 0 0  Total GAD 7 Score 3 0 0 0  Anxiety Difficulty Somewhat difficult Not difficult at all Not difficult at all Not difficult at all        01/03/2023    4:16 PM 01/03/2023    4:01 PM 04/28/2022    2:50 PM  Depression screen PHQ 2/9  Decreased Interest 0 0 0  Down, Depressed, Hopeless 1 0 0  PHQ - 2 Score 1 0 0  Altered sleeping 1  0  Tired, decreased energy 1  0  Change in appetite 0  0  Feeling bad or failure about yourself  0  0  Trouble concentrating 0  0  Moving slowly or fidgety/restless 0  0  Suicidal thoughts 0  0  PHQ-9 Score 3  0  Difficult doing work/chores Somewhat difficult  Not difficult at all    History Dalton has a past medical history of Abdominal hernia, Anemia, Anxiety,  Arthritis, B12 deficiency, Back pain, Cancer (HCC), Depression, Family history of adverse reaction to anesthesia, Gallbladder problem, GERD (gastroesophageal reflux disease), History of hiatal hernia, Hypertension, Hypothyroidism, Joint pain, Knee pain, Palpitation, Pernicious anemia, SOB (shortness of breath), Thyroid disease, and Vitamin D deficiency.   She has a past surgical history that includes Cesarean section (1999); Dilatation and currettagement; Breast biopsy (Left, 06/09/2022); and Portacath placement (Right, 07/05/2022).   Her family history includes Anxiety disorder in her mother; Breast cancer (age of onset: 30) in her cousin; CVA in her father; Colon cancer in her maternal aunt; Depression in her mother; Diabetes in her mother; High Cholesterol in her mother; Hypertension in her mother; Obesity in her mother; Ovarian cancer (age of onset: 92) in her maternal aunt; Thyroid disease in her mother.She reports that she has never smoked. She has never used smokeless tobacco. She reports that she does not drink alcohol and does not use drugs.    ROS Review of Systems  Constitutional: Negative.   HENT: Negative.    Eyes:  Negative for visual disturbance.  Respiratory:  Negative for shortness of breath.   Cardiovascular:  Negative  for chest pain.  Gastrointestinal:  Positive for diarrhea. Negative for abdominal pain.  Musculoskeletal:  Positive for arthralgias (knee pain).    Objective:  BP 119/77   Pulse 94   Temp (!) 97.1 F (36.2 C)   Ht 5\' 3"  (1.6 m)   Wt (!) 321 lb 3.2 oz (145.7 kg)   SpO2 92%   BMI 56.90 kg/m   BP Readings from Last 3 Encounters:  01/03/23 119/77  12/26/22 (!) 143/68  12/26/22 (!) 140/60    Wt Readings from Last 3 Encounters:  01/03/23 (!) 321 lb 3.2 oz (145.7 kg)  12/26/22 (!) 326 lb (147.9 kg)  12/05/22 (!) 321 lb 4.8 oz (145.7 kg)     Physical Exam Constitutional:      General: She is not in acute distress.    Appearance: She is  well-developed.  Cardiovascular:     Rate and Rhythm: Normal rate and regular rhythm.  Pulmonary:     Breath sounds: Normal breath sounds.  Musculoskeletal:        General: Normal range of motion.  Skin:    General: Skin is warm and dry.  Neurological:     Mental Status: She is alert and oriented to person, place, and time.       Assessment & Plan:   Soren was seen today for medical management of chronic issues.  Diagnoses and all orders for this visit:  Malignant neoplasm of upper-inner quadrant of left breast in female, estrogen receptor positive (HCC)  Anxiety and depression -     escitalopram (LEXAPRO) 20 MG tablet; Take 1 tablet (20 mg total) by mouth daily.  Morbid obesity (HCC)  Arthritis of knee  Gastroesophageal reflux disease with esophagitis without hemorrhage  Other orders -     lisinopril (ZESTRIL) 20 MG tablet; Take 1 tablet (20 mg total) by mouth daily. -     pantoprazole (PROTONIX) 40 MG tablet; Take 1 tablet (40 mg total) by mouth daily. -     rifaximin (XIFAXAN) 200 MG tablet; Take 1 tablet (200 mg total) by mouth 3 (three) times daily. For chemo related diarrhea       I have discontinued Emanuella P. Gan's dexamethasone, oxyCODONE, and doxycycline. I have also changed her escitalopram, lisinopril, and pantoprazole. Additionally, I am having her start on rifaximin. Lastly, I am having her maintain her cyanocobalamin, EPINEPHrine, LORazepam, ondansetron, prochlorperazine, lidocaine-prilocaine, cholestyramine, diphenoxylate-atropine, tamoxifen, and diclofenac.  Allergies as of 01/03/2023       Reactions   Misc. Sulfonamide Containing Compounds Rash, Itching   Phentermine    Became suicidal on the medication   Bee Venom Swelling   Peanut Allergen Powder-dnfp Other (See Comments)   Migraine   Topiramate Other (See Comments)   dizziness   Septra [sulfamethoxazole-trimethoprim] Rash   Sulfa Antibiotics Rash        Medication List         Accurate as of January 03, 2023  4:58 PM. If you have any questions, ask your nurse or doctor.          STOP taking these medications    dexamethasone 4 MG tablet Commonly known as: DECADRON Stopped by:     doxycycline 100 MG tablet Commonly known as: VIBRA-TABS Stopped by:     oxyCODONE 5 MG immediate release tablet Commonly known as: Oxy IR/ROXICODONE Stopped by:         TAKE these medications    cholestyramine 4 g packet Commonly known as: Questran Take 1 packet (4  g total) by mouth 3 (three) times daily with meals. What changed:  when to take this reasons to take this   cyanocobalamin 1000 MCG/ML injection Commonly known as: VITAMIN B12 INJECT 1 ML INTRAMUSCULARLY EVERY 15 DAYS What changed: See the new instructions.   diclofenac 75 MG EC tablet Commonly known as: VOLTAREN TAKE ONE TABLET TWICE DAILY AS NEEDED. needs appointment FOR refills   diphenoxylate-atropine 2.5-0.025 MG tablet Commonly known as: LOMOTIL TAKE TWO TABLETS EVERY 6 HOURS AS NEEDED FOR DIARRHEA   EPINEPHrine 0.3 mg/0.3 mL Soaj injection Commonly known as: EPI-PEN Inject 0.3 mg into the muscle as needed for anaphylaxis.   escitalopram 20 MG tablet Commonly known as: LEXAPRO Take 1 tablet (20 mg total) by mouth daily. What changed: additional instructions Changed by:     lidocaine-prilocaine cream Commonly known as: EMLA Apply to affected area once   lisinopril 20 MG tablet Commonly known as: ZESTRIL Take 1 tablet (20 mg total) by mouth daily.   LORazepam 0.5 MG tablet Commonly known as: ATIVAN Take 1 tablet (0.5 mg total) by mouth every 8 (eight) hours as needed for anxiety.   ondansetron 8 MG tablet Commonly known as: Zofran Take 1 tablet (8 mg total) by mouth every 8 (eight) hours as needed for nausea or vomiting.   pantoprazole 40 MG tablet Commonly known as: PROTONIX Take 1 tablet (40 mg total) by mouth daily.    prochlorperazine 10 MG tablet Commonly known as: COMPAZINE Take 1 tablet (10 mg total) by mouth every 6 (six) hours as needed for nausea or vomiting.   rifaximin 200 MG tablet Commonly known as: Xifaxan Take 1 tablet (200 mg total) by mouth 3 (three) times daily. For chemo related diarrhea Started by:     tamoxifen 20 MG tablet Commonly known as: NOLVADEX Take 1 tablet (20 mg total) by mouth daily.         Follow-up: Return in about 6 months (around 07/06/2023).  Mechele Claude, M.D.

## 2023-01-04 ENCOUNTER — Other Ambulatory Visit: Payer: Self-pay

## 2023-01-05 ENCOUNTER — Encounter: Payer: Self-pay | Admitting: Adult Health

## 2023-01-05 ENCOUNTER — Other Ambulatory Visit: Payer: Self-pay | Admitting: *Deleted

## 2023-01-05 MED ORDER — METHYLPREDNISOLONE 4 MG PO TBPK: ORAL_TABLET | ORAL | 0 refills | Status: DC

## 2023-01-06 ENCOUNTER — Ambulatory Visit (HOSPITAL_COMMUNITY)
Admission: RE | Admit: 2023-01-06 | Discharge: 2023-01-06 | Disposition: A | Discharge status: Home / Self Care | Payer: Commercial Managed Care - PPO | Source: Ambulatory Visit | Attending: Adult Health | Admitting: Adult Health

## 2023-01-06 DIAGNOSIS — C50212 Malignant neoplasm of upper-inner quadrant of left female breast: Secondary | ICD-10-CM | POA: Diagnosis present

## 2023-01-06 DIAGNOSIS — Z0189 Encounter for other specified special examinations: Secondary | ICD-10-CM | POA: Diagnosis not present

## 2023-01-06 DIAGNOSIS — Z170 Estrogen receptor positive status [ER+]: Secondary | ICD-10-CM | POA: Diagnosis present

## 2023-01-06 LAB — ECHOCARDIOGRAM COMPLETE
AR max vel: 2.18 cm2
AV Area VTI: 2.3 cm2
AV Area mean vel: 1.98 cm2
AV Mean grad: 6 mmHg
AV Peak grad: 11.8 mmHg
Ao pk vel: 1.72 m/s
Area-P 1/2: 4.15 cm2
MV VTI: 2.63 cm2
S' Lateral: 2.6 cm

## 2023-01-06 NOTE — Progress Notes (Signed)
*  PRELIMINARY RESULTS* Echocardiogram 2D Echocardiogram has been performed.  Carolyne Fiscal 01/06/2023, 2:59 PM

## 2023-01-08 ENCOUNTER — Other Ambulatory Visit: Payer: Self-pay | Admitting: Hematology and Oncology

## 2023-01-08 ENCOUNTER — Encounter: Payer: Self-pay | Admitting: Adult Health

## 2023-01-08 NOTE — Progress Notes (Signed)
Called patient to discuss reports. We will reach out to [facility] to get additional information. I will try to call her back with additional information.  Diamond Marshall

## 2023-01-09 ENCOUNTER — Emergency Department (HOSPITAL_COMMUNITY)
Admission: EM | Admit: 2023-01-09 | Discharge: 2023-01-10 | Disposition: A | Discharge status: Home / Self Care | Payer: Commercial Managed Care - PPO | Attending: Emergency Medicine | Admitting: Emergency Medicine

## 2023-01-09 ENCOUNTER — Encounter: Payer: Self-pay | Admitting: Family Medicine

## 2023-01-09 ENCOUNTER — Other Ambulatory Visit: Payer: Self-pay

## 2023-01-09 ENCOUNTER — Encounter: Payer: Self-pay | Admitting: Physician Assistant

## 2023-01-09 DIAGNOSIS — C50919 Malignant neoplasm of unspecified site of unspecified female breast: Secondary | ICD-10-CM | POA: Insufficient documentation

## 2023-01-09 DIAGNOSIS — K625 Hemorrhage of anus and rectum: Secondary | ICD-10-CM | POA: Diagnosis present

## 2023-01-09 DIAGNOSIS — K644 Residual hemorrhoidal skin tags: Secondary | ICD-10-CM | POA: Diagnosis not present

## 2023-01-09 DIAGNOSIS — R197 Diarrhea, unspecified: Secondary | ICD-10-CM | POA: Diagnosis not present

## 2023-01-09 DIAGNOSIS — Z91010 Allergy to peanuts: Secondary | ICD-10-CM | POA: Diagnosis not present

## 2023-01-09 LAB — URINALYSIS, ROUTINE W REFLEX MICROSCOPIC
Bilirubin Urine: NEGATIVE
Glucose, UA: NEGATIVE mg/dL
Hgb urine dipstick: NEGATIVE
Ketones, ur: NEGATIVE mg/dL
Nitrite: NEGATIVE
Protein, ur: NEGATIVE mg/dL
Specific Gravity, Urine: 1.009 (ref 1.005–1.030)
pH: 5 (ref 5.0–8.0)

## 2023-01-09 LAB — CBC
HCT: 38.2 % (ref 36.0–46.0)
Hemoglobin: 11.5 g/dL — ABNORMAL LOW (ref 12.0–15.0)
MCH: 22.7 pg — ABNORMAL LOW (ref 26.0–34.0)
MCHC: 30.1 g/dL (ref 30.0–36.0)
MCV: 75.5 fL — ABNORMAL LOW (ref 80.0–100.0)
Platelets: 369 10*3/uL (ref 150–400)
RBC: 5.06 MIL/uL (ref 3.87–5.11)
RDW: 16.8 % — ABNORMAL HIGH (ref 11.5–15.5)
WBC: 6.3 10*3/uL (ref 4.0–10.5)
nRBC: 0 % (ref 0.0–0.2)

## 2023-01-09 LAB — COMPREHENSIVE METABOLIC PANEL
ALT: 21 U/L (ref 0–44)
AST: 19 U/L (ref 15–41)
Albumin: 3.7 g/dL (ref 3.5–5.0)
Alkaline Phosphatase: 97 U/L (ref 38–126)
Anion gap: 11 (ref 5–15)
BUN: 21 mg/dL — ABNORMAL HIGH (ref 6–20)
CO2: 25 mmol/L (ref 22–32)
Calcium: 9.2 mg/dL (ref 8.9–10.3)
Chloride: 102 mmol/L (ref 98–111)
Creatinine, Ser: 0.73 mg/dL (ref 0.44–1.00)
GFR, Estimated: >60 mL/min (ref >60)
Glucose, Bld: 112 mg/dL — ABNORMAL HIGH (ref 70–99)
Potassium: 4.3 mmol/L (ref 3.5–5.1)
Sodium: 138 mmol/L (ref 135–145)
Total Bilirubin: 0.3 mg/dL (ref 0.3–1.2)
Total Protein: 7.9 g/dL (ref 6.5–8.1)

## 2023-01-09 LAB — LIPASE, BLOOD: Lipase: 29 U/L (ref 11–51)

## 2023-01-09 MED ORDER — LIDOCAINE HCL 2 % EX GEL: 1.0000 | CUTANEOUS | 2 refills | Status: DC | PRN

## 2023-01-09 MED ORDER — HYDROCORTISONE ACETATE 25 MG RE SUPP
25.0000 mg | Freq: Once | RECTAL | Status: AC
  Administered 2023-01-10: 25 mg via RECTAL
  Filled 2023-01-09: qty 1

## 2023-01-09 MED ORDER — HYDROCORTISONE (PERIANAL) 2.5 % EX CREA: 1.0000 | TOPICAL_CREAM | Freq: Two times a day (BID) | CUTANEOUS | 2 refills | Status: DC

## 2023-01-09 NOTE — ED Triage Notes (Signed)
Pt reports diarrhea since 08/12, noticed bright red blood with bm today.

## 2023-01-09 NOTE — ED Provider Notes (Signed)
[place] EMERGENCY DEPARTMENT AT [facility] Provider Note   CSN: [id number] Arrival date & time: 01/09/23  1847     History {Add pertinent medical, surgical, social history, OB history to HPI:1} Chief Complaint  Patient presents with   Diarrhea    Diamond Marshall is a 52 y.o. female.  The history is provided by the patient and medical records.  Diarrhea      Home Medications Prior to Admission medications   Medication Sig Start Date End Date Taking? Authorizing Provider  hydrocortisone (ANUSOL-HC) 2.5 % rectal cream Place 1 Application rectally 2 (two) times daily. 01/09/23  Yes Garlon Hatchet, PA-C  lidocaine (XYLOCAINE) 2 % jelly 1 Application by Other route as needed. 01/09/23  Yes Garlon Hatchet, PA-C  cholestyramine (QUESTRAN) 4 g packet Take 1 packet (4 g total) by mouth 3 (three) times daily with meals. Patient taking differently: Take 4 g by mouth 3 (three) times daily as needed (diarrhea). 07/24/22   Walisiewicz, Yvonna Alanis E, PA-C  cyanocobalamin (,VITAMIN B-12,) 1000 MCG/ML injection INJECT 1 ML INTRAMUSCULARLY EVERY 15 DAYS Patient taking differently: Inject 1,000 mcg into the muscle every 30 (thirty) days. 08/19/21   Mechele Claude, MD  diclofenac (VOLTAREN) 75 MG EC tablet TAKE ONE TABLET TWICE DAILY AS NEEDED. needs appointment FOR refills 11/15/22   Mechele Claude, MD  diphenoxylate-atropine (LOMOTIL) 2.5-0.025 MG tablet TAKE TWO TABLETS EVERY 6 HOURS AS NEEDED FOR DIARRHEA 10/03/22   Loa Socks, NP  EPINEPHrine 0.3 mg/0.3 mL IJ SOAJ injection Inject 0.3 mg into the muscle as needed for anaphylaxis. 12/16/21   Gwenlyn Fudge, FNP  escitalopram (LEXAPRO) 20 MG tablet Take 1 tablet (20 mg total) by mouth daily. 01/03/23   Mechele Claude, MD  lidocaine-prilocaine (EMLA) cream Apply to affected area once 07/03/22   Rachel Moulds, MD  lisinopril (ZESTRIL) 20 MG tablet Take 1 tablet (20 mg total) by mouth daily. 01/03/23   Mechele Claude, MD   LORazepam (ATIVAN) 0.5 MG tablet Take 1 tablet (0.5 mg total) by mouth every 8 (eight) hours as needed for anxiety. 06/22/22   Rachel Moulds, MD  methylPREDNISolone (MEDROL DOSEPAK) 4 MG TBPK tablet Take as directed 01/05/23   Loa Socks, NP  ondansetron (ZOFRAN) 8 MG tablet Take 1 tablet (8 mg total) by mouth every 8 (eight) hours as needed for nausea or vomiting. 07/03/22   Rachel Moulds, MD  pantoprazole (PROTONIX) 40 MG tablet Take 1 tablet (40 mg total) by mouth daily. 01/03/23   Mechele Claude, MD  prochlorperazine (COMPAZINE) 10 MG tablet Take 1 tablet (10 mg total) by mouth every 6 (six) hours as needed for nausea or vomiting. 07/03/22   Rachel Moulds, MD  rifaximin (XIFAXAN) 200 MG tablet Take 1 tablet (200 mg total) by mouth 3 (three) times daily. For chemo related diarrhea 01/03/23   Mechele Claude, MD  tamoxifen (NOLVADEX) 20 MG tablet Take 1 tablet (20 mg total) by mouth daily. 11/14/22   Rachel Moulds, MD      Allergies    Misc. sulfonamide containing compounds, Phentermine, Bee venom, Peanut allergen powder-dnfp, Topiramate, Septra [sulfamethoxazole-trimethoprim], and Sulfa antibiotics    Review of Systems   Review of Systems  Gastrointestinal:  Positive for diarrhea.  All other systems reviewed and are negative.   Physical Exam Updated Vital Signs BP (!) 190/107 (BP Location: Left Arm)   Pulse 80   Temp 98.2 F (36.8 C) (Oral)   Resp 20   Ht 5\' 3"  (1.6  m)   Wt (!) 145.6 kg   SpO2 96%   BMI 56.86 kg/m   Physical Exam Vitals and nursing note reviewed.  Constitutional:      Appearance: She is well-developed. She is obese. She is not ill-appearing.  HENT:     Head: Normocephalic and atraumatic.  Eyes:     Conjunctiva/sclera: Conjunctivae normal.     Pupils: Pupils are equal, round, and reactive to light.  Cardiovascular:     Rate and Rhythm: Normal rate and regular rhythm.     Heart sounds: Normal heart sounds.  Pulmonary:     Effort: Pulmonary  effort is normal.     Breath sounds: Normal breath sounds.  Abdominal:     General: Bowel sounds are normal.     Palpations: Abdomen is soft.     Tenderness: There is no abdominal tenderness.     Comments: Abdomen soft, non-tender  Musculoskeletal:        General: Normal range of motion.     Cervical back: Normal range of motion.  Skin:    General: Skin is warm and dry.  Neurological:     Mental Status: She is alert and oriented to person, place, and time.     ED Results / Procedures / Treatments   Labs (all labs ordered are listed, but only abnormal results are displayed) Labs Reviewed  COMPREHENSIVE METABOLIC PANEL - Abnormal; Notable for the following components:      Result Value   Glucose, Bld 112 (*)    BUN 21 (*)    All other components within normal limits  CBC - Abnormal; Notable for the following components:   Hemoglobin 11.5 (*)    MCV 75.5 (*)    MCH 22.7 (*)    RDW 16.8 (*)    All other components within normal limits  URINALYSIS, ROUTINE W REFLEX MICROSCOPIC - Abnormal; Notable for the following components:   Color, Urine STRAW (*)    Leukocytes,Ua TRACE (*)    Bacteria, UA RARE (*)    All other components within normal limits  LIPASE, BLOOD    EKG None  Radiology No results found.  Procedures Procedures  {Document cardiac monitor, telemetry assessment procedure when appropriate:1}  Medications Ordered in ED Medications  hydrocortisone (ANUSOL-HC) suppository 25 mg (has no administration in time range)    ED Course/ Medical Decision Making/ A&P   {   Click here for ABCD2, HEART and other calculatorsREFRESH Note before signing :1}                              Medical Decision Making Amount and/or Complexity of Data Reviewed Labs: ordered.  Risk Prescription drug management.   ***  {Document critical care time when appropriate:1} {Document review of labs and clinical decision tools ie heart score, Chads2Vasc2 etc:1}  {Document your  independent review of radiology images, and any outside records:1} {Document your discussion with family members, caretakers, and with consultants:1} {Document social determinants of health affecting pt's care:1} {Document your decision making why or why not admission, treatments were needed:1} Final Clinical Impression(s) / ED Diagnoses Final diagnoses:  None    Rx / DC Orders ED Discharge Orders          Ordered    hydrocortisone (ANUSOL-HC) 2.5 % rectal cream  2 times daily        01/09/23 2344    lidocaine (XYLOCAINE) 2 % jelly  As needed  01/09/23 2344            

## 2023-01-10 ENCOUNTER — Other Ambulatory Visit (HOSPITAL_COMMUNITY): Payer: Self-pay

## 2023-01-10 ENCOUNTER — Telehealth: Payer: Self-pay

## 2023-01-10 ENCOUNTER — Encounter: Payer: Self-pay | Admitting: Hematology and Oncology

## 2023-01-10 ENCOUNTER — Telehealth: Payer: Self-pay | Admitting: Hematology and Oncology

## 2023-01-10 DIAGNOSIS — C50212 Malignant neoplasm of upper-inner quadrant of left female breast: Secondary | ICD-10-CM

## 2023-01-10 NOTE — Telephone Encounter (Signed)
PA request has been Submitted. New Encounter created for follow up. For additional info see Pharmacy Prior Auth telephone encounter from 08/21.

## 2023-01-10 NOTE — Telephone Encounter (Signed)
I called her back to discuss update from radiology With in the limitations of exam, the radiologist believes the skin thickening is the same across all CT's Now the most important question is if this is local progression or if this is residual disease. She has responded very well in all other areas. I recommend a referral to breast surgery, we will also try and present her in breast TB. She is agreeable to this plan  Ann Bohne

## 2023-01-10 NOTE — Discharge Instructions (Addendum)
Medications sent to pharmacy for home use.  Can continue using the OTC creams as well if you like. Follow-up with oncology next week as scheduled. Return here for new concerns.

## 2023-01-10 NOTE — Telephone Encounter (Signed)
*  Primary  Pharmacy Patient Advocate Encounter   Received notification from Patient Advice Request messages that prior authorization for Xifaxan 200MG  tablets  is required/requested.   Insurance verification completed.   The patient is insured through [employer] .   Per test claim: PA required; PA submitted to [employer] via CoverMyMeds Key/confirmation #/EOC [id number] Status is pending

## 2023-01-11 ENCOUNTER — Telehealth: Payer: Self-pay

## 2023-01-11 ENCOUNTER — Telehealth: Payer: Self-pay | Admitting: *Deleted

## 2023-01-11 NOTE — Transitions of Care (Post Inpatient/ED Visit) (Signed)
01/11/2023  Name: Diamond Marshall MRN: [id number] DOB: Mar 08, 1971  Today's TOC FU Call Status: Today's TOC FU Call Status:: Successful TOC FU Call Completed TOC FU Call Complete Date: 01/11/23  Transition Care Management Follow-up Telephone Call Date of Discharge: 01/10/23 Discharge Facility: [facility]) Type of Discharge: Emergency Department Reason for ED Visit: Other: (diarrhea) How have you been since you were released from the hospital?: Better Any questions or concerns?: No  Items Reviewed: Did you receive and understand the discharge instructions provided?: No Medications obtained,verified, and reconciled?: Yes (Medications Reviewed) Any new allergies since your discharge?: No Dietary orders reviewed?: Yes Do you have support at home?: Yes People in Home: spouse  Medications Reviewed Today: Medications Reviewed Today     Reviewed by Karena Addison, LPN (Licensed Practical Nurse) on 01/11/23 at 1114  Med List Status: <None>   Medication Order Taking? Sig Documenting Provider Last Dose Status Informant  cholestyramine (QUESTRAN) 4 g packet [id number] No Take 1 packet (4 g total) by mouth 3 (three) times daily with meals.  Patient taking differently: Take 4 g by mouth 3 (three) times daily as needed (diarrhea).   Shanon Ace, PA-C Taking Active Self  cyanocobalamin (,VITAMIN B-12,) 1000 MCG/ML injection [id number] No INJECT 1 ML INTRAMUSCULARLY EVERY 15 DAYS  Patient taking differently: Inject 1,000 mcg into the muscle every 30 (thirty) days.   Mechele Claude, MD Taking Active Self  diclofenac (VOLTAREN) 75 MG EC tablet [id number] No TAKE ONE TABLET TWICE DAILY AS NEEDED. needs appointment FOR refills Mechele Claude, MD Taking Active   diphenoxylate-atropine (LOMOTIL) 2.5-0.025 MG tablet [id number] No TAKE TWO TABLETS EVERY 6 HOURS AS NEEDED FOR DIARRHEA Causey, Larna Daughters, NP Taking Active   EPINEPHrine 0.3 mg/0.3 mL IJ SOAJ injection [id number] No Inject  0.3 mg into the muscle as needed for anaphylaxis. Gwenlyn Fudge, FNP Taking Active Self  escitalopram (LEXAPRO) 20 MG tablet [id number]  Take 1 tablet (20 mg total) by mouth daily. Mechele Claude, MD  Active   hydrocortisone (ANUSOL-HC) 2.5 % rectal cream [id number]  Place 1 Application rectally 2 (two) times daily. Garlon Hatchet, PA-C  Active   lidocaine (XYLOCAINE) 2 % jelly [id number]  1 Application by Other route as needed. Garlon Hatchet, PA-C  Active   lidocaine-prilocaine (EMLA) cream [id number] No Apply to affected area once Rachel Moulds, MD Taking Active Self           Med Note Tonny Bollman   Tue Dec 26, 2022  9:46 AM) Patient does not use   lisinopril (ZESTRIL) 20 MG tablet [id number]  Take 1 tablet (20 mg total) by mouth daily. Mechele Claude, MD  Active   LORazepam (ATIVAN) 0.5 MG tablet [id number] No Take 1 tablet (0.5 mg total) by mouth every 8 (eight) hours as needed for anxiety. Rachel Moulds, MD Taking Active Self  methylPREDNISolone (MEDROL DOSEPAK) 4 MG TBPK tablet [id number]  Take as directed Causey, Larna Daughters, NP  Active   ondansetron (ZOFRAN) 8 MG tablet [id number] No Take 1 tablet (8 mg total) by mouth every 8 (eight) hours as needed for nausea or vomiting. Rachel Moulds, MD Taking Active Self  pantoprazole (PROTONIX) 40 MG tablet [id number]  Take 1 tablet (40 mg total) by mouth daily. Mechele Claude, MD  Active   prochlorperazine (COMPAZINE) 10 MG tablet [id number] No Take 1 tablet (10 mg total) by mouth every 6 (six) hours as needed for nausea or vomiting. Rachel Moulds, MD Taking Active Self  rifaximin (  XIFAXAN) 200 MG tablet [id number]  Take 1 tablet (200 mg total) by mouth 3 (three) times daily. For chemo related diarrhea Mechele Claude, MD  Active   tamoxifen (NOLVADEX) 20 MG tablet [id number] No Take 1 tablet (20 mg total) by mouth daily. Rachel Moulds, MD Taking Active             Home Care and Equipment/Supplies: Were Home Health Services  Ordered?: NA Any new equipment or medical supplies ordered?: NA  Functional Questionnaire: Do you need assistance with bathing/showering or dressing?: No Do you need assistance with meal preparation?: No Do you need assistance with eating?: No Do you have difficulty maintaining continence: No Do you need assistance with getting out of bed/getting out of a chair/moving?: No Do you have difficulty managing or taking your medications?: No  Follow up appointments reviewed: PCP Follow-up appointment confirmed?: NA Specialist Hospital Follow-up appointment confirmed?: Yes Date of Specialist follow-up appointment?: 01/10/23 Follow-Up Specialty Provider:: onco Do you need transportation to your follow-up appointment?: No Do you understand care options if your condition(s) worsen?: Yes-patient verbalized understanding    SIGNATURE Karena Addison, LPN [facility] Nurse Health Advisor Direct Dial [phone]

## 2023-01-11 NOTE — Telephone Encounter (Signed)
This RN contacted [place] [facility] - spoke with pathologist who will proceed with order for the Breast Prognostic Panel on current punch [facility]

## 2023-01-15 ENCOUNTER — Telehealth: Payer: Self-pay | Admitting: *Deleted

## 2023-01-15 ENCOUNTER — Encounter: Payer: Self-pay | Admitting: Hematology and Oncology

## 2023-01-15 NOTE — Telephone Encounter (Signed)
Pt states she feels she is becoming dehydrated since incurring diarrhea approximately 2 weeks ago with little benefit form lomotil, questran - medrol dose pak - and is hoping we can administer additional IV fluids with her treatment tomorrow.  This RN informed her above should not be a problem.  Of note her diarrhea was being worked up by her primary MD due to pt' known history of IBS.  Pt is scheduled tomorrow for lab visit with MD and treatment.  This note will be forwarded to MD for review for appointment tomorrow

## 2023-01-16 ENCOUNTER — Inpatient Hospital Stay: Payer: Commercial Managed Care - PPO

## 2023-01-16 ENCOUNTER — Inpatient Hospital Stay: Payer: Commercial Managed Care - PPO | Admitting: Hematology and Oncology

## 2023-01-16 VITALS — BP 136/76 | HR 83 | Resp 16

## 2023-01-16 DIAGNOSIS — C50212 Malignant neoplasm of upper-inner quadrant of left female breast: Secondary | ICD-10-CM

## 2023-01-16 DIAGNOSIS — Z170 Estrogen receptor positive status [ER+]: Secondary | ICD-10-CM | POA: Diagnosis not present

## 2023-01-16 DIAGNOSIS — Z5112 Encounter for antineoplastic immunotherapy: Secondary | ICD-10-CM | POA: Diagnosis not present

## 2023-01-16 DIAGNOSIS — Z95828 Presence of other vascular implants and grafts: Secondary | ICD-10-CM

## 2023-01-16 LAB — CBC WITH DIFFERENTIAL (CANCER CENTER ONLY)
Abs Immature Granulocytes: 0.05 10*3/uL (ref 0.00–0.07)
Basophils Absolute: 0 10*3/uL (ref 0.0–0.1)
Basophils Relative: 1 %
Eosinophils Absolute: 0.3 10*3/uL (ref 0.0–0.5)
Eosinophils Relative: 3 %
HCT: 35.4 % — ABNORMAL LOW (ref 36.0–46.0)
Hemoglobin: 11.1 g/dL — ABNORMAL LOW (ref 12.0–15.0)
Immature Granulocytes: 1 %
Lymphocytes Relative: 24 %
Lymphs Abs: 1.9 10*3/uL (ref 0.7–4.0)
MCH: 22.9 pg — ABNORMAL LOW (ref 26.0–34.0)
MCHC: 31.4 g/dL (ref 30.0–36.0)
MCV: 73 fL — ABNORMAL LOW (ref 80.0–100.0)
Monocytes Absolute: 0.6 10*3/uL (ref 0.1–1.0)
Monocytes Relative: 7 %
Neutro Abs: 4.9 10*3/uL (ref 1.7–7.7)
Neutrophils Relative %: 64 %
Platelet Count: 350 10*3/uL (ref 150–400)
RBC: 4.85 MIL/uL (ref 3.87–5.11)
RDW: 17.1 % — ABNORMAL HIGH (ref 11.5–15.5)
WBC Count: 7.6 10*3/uL (ref 4.0–10.5)
nRBC: 0 % (ref 0.0–0.2)

## 2023-01-16 LAB — CMP (CANCER CENTER ONLY)
ALT: 23 U/L (ref 0–44)
AST: 18 U/L (ref 15–41)
Albumin: 3.5 g/dL (ref 3.5–5.0)
Alkaline Phosphatase: 100 U/L (ref 38–126)
Anion gap: 7 (ref 5–15)
BUN: 9 mg/dL (ref 6–20)
CO2: 27 mmol/L (ref 22–32)
Calcium: 8.8 mg/dL — ABNORMAL LOW (ref 8.9–10.3)
Chloride: 106 mmol/L (ref 98–111)
Creatinine: 0.81 mg/dL (ref 0.44–1.00)
GFR, Estimated: >60 mL/min (ref >60)
Glucose, Bld: 105 mg/dL — ABNORMAL HIGH (ref 70–99)
Potassium: 3.9 mmol/L (ref 3.5–5.1)
Sodium: 140 mmol/L (ref 135–145)
Total Bilirubin: 0.2 mg/dL — ABNORMAL LOW (ref 0.3–1.2)
Total Protein: 7 g/dL (ref 6.5–8.1)

## 2023-01-16 MED ORDER — SODIUM CHLORIDE 0.9% FLUSH
10.0000 mL | Freq: Once | INTRAVENOUS | Status: AC
  Administered 2023-01-16: 10 mL

## 2023-01-16 MED ORDER — DIPHENHYDRAMINE HCL 25 MG PO CAPS
50.0000 mg | ORAL_CAPSULE | Freq: Once | ORAL | Status: AC
  Administered 2023-01-16: 25 mg via ORAL
  Filled 2023-01-16: qty 2

## 2023-01-16 MED ORDER — TRASTUZUMAB-ANNS CHEMO 150 MG IV SOLR
6.0000 mg/kg | Freq: Once | INTRAVENOUS | Status: AC
  Administered 2023-01-16: 840 mg via INTRAVENOUS
  Filled 2023-01-16: qty 40

## 2023-01-16 MED ORDER — HEPARIN SOD (PORK) LOCK FLUSH 100 UNIT/ML IV SOLN
500.0000 [IU] | Freq: Once | INTRAVENOUS | Status: AC | PRN
  Administered 2023-01-16: 500 [IU]

## 2023-01-16 MED ORDER — SODIUM CHLORIDE 0.9% FLUSH
10.0000 mL | INTRAVENOUS | Status: DC | PRN
  Administered 2023-01-16: 10 mL

## 2023-01-16 MED ORDER — ACETAMINOPHEN 325 MG PO TABS
650.0000 mg | ORAL_TABLET | Freq: Once | ORAL | Status: AC
  Administered 2023-01-16: 650 mg via ORAL
  Filled 2023-01-16: qty 2

## 2023-01-16 MED ORDER — SODIUM CHLORIDE 0.9 % IV SOLN
420.0000 mg | Freq: Once | INTRAVENOUS | Status: AC
  Administered 2023-01-16: 420 mg via INTRAVENOUS
  Filled 2023-01-16: qty 14

## 2023-01-16 MED ORDER — SODIUM CHLORIDE 0.9 % IV SOLN: Freq: Once | INTRAVENOUS | Status: AC

## 2023-01-16 NOTE — Assessment & Plan Note (Signed)
Diamond Marshall is a 52 year old woman with stage 4 triple positive breast cancer on treatment with tamoxifen and Herceptin Perjeta.    Stage IV breast cancer: She continues on tamoxifen and Herceptin Perjeta.  She has grade 2 diarrhea but this is likely unrelated to pertuzumab alone, she has underlying diarrhea predominant irritable bowel syndrome but has never seen gastroenterology.  She is currently taking Imodium, Lomotil, Questran and is now taking some pancreatic enzymes which actually have helped her the most so far.  She is not dehydrated, she is able to keep up with her oral hydration.   Left breast thickness and erythema: Skin biopsy showed residual breast cancer.  At this time since she mentions that the skin changes are relatively new and since she has had excellent response elsewhere, we have sent a referral back to Dr. Luisa Hart to see if she may benefit from surgical resection of this area.  She is willing to discuss about it. Iron deficiency: Continue oral iron, will monitor CBC.  Ghia will proceed with treatment today.  We reviewed her labs in detail.  I placed orders for her next echocardiogram and she will return every 3 weeks for Herceptin Perjeta.

## 2023-01-16 NOTE — Patient Instructions (Signed)
[facility]  Discharge Instructions: Thank you for choosing [facility] to provide your oncology and hematology care.   If you have a lab appointment with the Cancer Center, please go directly to the Cancer Center and check in at the registration area.   Wear comfortable clothing and clothing appropriate for easy access to any Portacath or PICC line.   We strive to give you quality time with your provider. You may need to reschedule your appointment if you arrive late (15 or more minutes).  Arriving late affects you and other patients whose appointments are after yours.  Also, if you miss three or more appointments without notifying the office, you may be dismissed from the clinic at the provider's discretion.      For prescription refill requests, have your pharmacy contact our office and allow 72 hours for refills to be completed.    Today you received the following chemotherapy and/or immunotherapy agents: Trastuzumab, Pertuzumab.      To help prevent nausea and vomiting after your treatment, we encourage you to take your nausea medication as directed.  BELOW ARE SYMPTOMS THAT SHOULD BE REPORTED IMMEDIATELY: *FEVER GREATER THAN 100.4 F (38 C) OR HIGHER *CHILLS OR SWEATING *NAUSEA AND VOMITING THAT IS NOT CONTROLLED WITH YOUR NAUSEA MEDICATION *UNUSUAL SHORTNESS OF BREATH *UNUSUAL BRUISING OR BLEEDING *URINARY PROBLEMS (pain or burning when urinating, or frequent urination) *BOWEL PROBLEMS (unusual diarrhea, constipation, pain near the anus) TENDERNESS IN MOUTH AND THROAT WITH OR WITHOUT PRESENCE OF ULCERS (sore throat, sores in mouth, or a toothache) UNUSUAL RASH, SWELLING OR PAIN  UNUSUAL VAGINAL DISCHARGE OR ITCHING   Items with * indicate a potential emergency and should be followed up as soon as possible or go to the Emergency Department if any problems should occur.  Please show the CHEMOTHERAPY ALERT CARD or IMMUNOTHERAPY  ALERT CARD at check-in to the Emergency Department and triage nurse.  Should you have questions after your visit or need to cancel or reschedule your appointment, please contact [facility]  Dept: [phone]  and follow the prompts.  Office hours are 8:00 a.m. to 4:30 p.m. Monday - Friday. Please note that voicemails left after 4:00 p.m. may not be returned until the following business day.  We are closed weekends and major holidays. You have access to a nurse at all times for urgent questions. Please call the main number to the clinic Dept: [phone] and follow the prompts.   For any non-urgent questions, you may also contact your provider using MyChart. We now offer e-Visits for anyone 18 and older to request care online for non-urgent symptoms. For details visit [email or web address].   Also download the MyChart app! Go to the app store, search "MyChart", open the app, select [facility], and log in with your MyChart username and password.  

## 2023-01-16 NOTE — Progress Notes (Signed)
[facility] Cancer Follow up:    Diamond Claude, MD [address] [place] [address]   DIAGNOSIS:  Cancer Staging  Malignant neoplasm of upper-inner quadrant of left breast in female, estrogen receptor positive (HCC) Staging form: Breast, AJCC 8th Edition - Clinical stage from 06/21/2022: Stage IIIB (cT3, cN3, cM0, G3, ER+, PR+, HER2+) - Unsigned Stage prefix: Initial diagnosis Histologic grading system: 3 grade system Stage used in treatment planning: Yes National guidelines used in treatment planning: Yes Type of national guideline used in treatment planning: NCCN   SUMMARY OF ONCOLOGIC HISTORY: Oncology History  Malignant neoplasm of upper-inner quadrant of left breast in female, estrogen receptor positive (HCC)  06/06/2022 Mammogram   Diagnostic mammogram given palpable mass showed suspicious spiculated left breast mass with surrounding nodularity at 9 o'clock position.  This measures up to 3.1 cm sonographically however due to deep location and patient body habitus the mammographic measurement of 5.8 cm is felt to be more accurate.  At least 2 abnormal lymph nodes in the left axilla.  Possible abnormal palpable left supraclavicular lymph node.   06/06/2022 Breast US   Breast ultrasound confirmed these findings.   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  Prognostic showed ER 65% weak to strong staining, PR 15% weak to moderate staining, Ki-67 of 50% and HER2 positive by IHC at 3+    Genetic Testing   [facility] Custom Panel+RNA was Negative. Report date is 06/28/2022.   The Custom Hereditary Cancers Panel offered by [facility] includes sequencing and/or deletion duplication testing of the following 43 genes: APC, ATM, AXIN2, BAP1, BARD1, BMPR1A, BRCA1, BRCA2, BRIP1, CDH1, CDK4, CDKN2A (p14ARF and  p16INK4a only), CHEK2, CTNNA1, EPCAM (Deletion/duplication testing only), FH, GREM1 (promoter region duplication testing only), HOXB13, KIT, MBD4, MEN1, MLH1, MSH2, MSH3, MSH6, MUTYH, NF1, NHTL1, PALB2, PDGFRA, PMS2, POLD1, POLE, PTEN, RAD51C, RAD51D, SMAD4, SMARCA4. STK11, TP53, TSC1, TSC2, and VHL.    06/29/2022 PET scan   IMPRESSION: 1. Hypermetabolic left breast mass, compatible with primary breast malignancy. 2. Hypermetabolic left cervical, axillary, subpectoral, supraclavicular and prevascular mediastinal lymph nodes, compatible with nodal metastatic disease. 3. No evidence of metastatic disease in the abdomen or pelvis. 4. Aortic Atherosclerosis (ICD10-I70.0).     Electronically Signed   By: Allegra Lai M.D.   On: 06/29/2022 09:32   07/11/2022 -  Chemotherapy   Patient is on Treatment Plan : BREAST DOCEtaxel + Trastuzumab + Pertuzumab (THP) q21d x 8 cycles / Trastuzumab + Pertuzumab q21d x 4 cycles       CURRENT THERAPY: Tamoxifen, Herceptin, Perjeta  INTERVAL HISTORY: Diamond Marshall 52 y.o. female returns for follow-up prior to receiving her Herceptin and Perjeta.  She continues on tamoxifen daily with good tolerance.  Since her last visit here, she continues to have issues with diarrhea.  She is just started on some supplemental enzymes with pancreatic enzymes and she says the diarrhea is a bit better.  She is otherwise taking Imodium, Lomotil, Questran and still has issues with multiple bowel movements.  She does not have large bowel movements.  She however notices that they are watery and yellow.  No abdominal pain, fevers or chills.  She denies any other side effects with the current medications.  She is going to see Dr. Luisa Hart soon for discussion of surgical recommendations.  Patient Active  Problem List   Diagnosis Date Noted   Gastroenteritis 09/29/2022   Port-A-Cath in place 07/07/2022   Genetic testing 06/29/2022   Malignant neoplasm of upper-inner quadrant of left  breast in female, estrogen receptor positive (HCC) 06/19/2022   Chronic pain of right knee 03/27/2022   Essential hypertension 03/27/2022   Positive self-administered antigen test for COVID-19 03/15/2021   Prediabetes 07/22/2019   Anxiety and depression 03/15/2016   Vitamin D deficiency 03/15/2016   Metabolic syndrome X 08/07/2013   Elevated random blood glucose level 08/07/2013   Hypertriglyceridemia 08/07/2013   ADD (attention deficit disorder) 07/24/2013    hyperlipidemia 10/09/2012   Morbid obesity (HCC) 10/09/2012   Hypothyroidism 10/09/2012   B12 deficiency 10/09/2012   Anemia, unspecified 02/12/2011   Dysthymic disorder 02/12/2011   Migraine headache 02/12/2011   Obsessive-compulsive disorder 02/12/2011   Tinnitus 02/12/2011    is allergic to misc. sulfonamide containing compounds, phentermine, bee venom, peanut allergen powder-dnfp, topiramate, septra [sulfamethoxazole-trimethoprim], and sulfa antibiotics.  MEDICAL HISTORY: Past Medical History:  Diagnosis Date   Abdominal hernia    Anemia    b12 deficiency   Anxiety    Arthritis    B12 deficiency    Back pain    Cancer (HCC)    breast cancer   Depression    Family history of adverse reaction to anesthesia    mother vomits after anesthesia   Gallbladder problem    GERD (gastroesophageal reflux disease)    History of hiatal hernia    "small" per patient   Hypertension    Hypothyroidism    Joint pain    Knee pain    Palpitation    "when iron is low"   Pernicious anemia    SOB (shortness of breath)    Thyroid disease    hypothyroidism   Vitamin D deficiency     SURGICAL HISTORY: Past Surgical History:  Procedure Laterality Date   BREAST BIOPSY Left 06/09/2022   [place] LT BREAST BX W LOC DEV 1ST LESION IMG BX SPEC US GUIDE 06/09/2022 [facility] MAMMOGRAPHY   CESAREAN SECTION  1999   Dilatation and currettagement     for miscarriage 18 years ago   PORTACATH PLACEMENT Right 07/05/2022   Procedure: INSERTION  PORT-A-CATH;  Surgeon: Harriette Bouillon, MD;  Location: [facility] OR;  Service: General;  Laterality: Right;    SOCIAL HISTORY: Social History   Socioeconomic History   Marital status: Married    Spouse name: Kathlene November   Number of children: Not on file   Years of education: Not on file   Highest education level: Not on file  Occupational History   Occupation: [employer] copy  Tobacco Use   Smoking status: Never   Smokeless tobacco: Never  Vaping Use   Vaping status: Never Used  Substance and Sexual Activity   Alcohol use: No   Drug use: No   Sexual activity: Yes    Birth control/protection: Pill  Other Topics Concern   Not on file  Social History Narrative   Not on file   Social Determinants of Health   Financial Resource Strain: Not on file  Food Insecurity: No Food Insecurity (09/30/2022)   Hunger Vital Sign    Worried About Running Out of Food in the Last Year: Never true    Ran Out of Food in the Last Year: Never true  Transportation Needs: No Transportation Needs (09/30/2022)   PRAPARE - Transportation    Lack of Transportation (Medical): No    Lack of  Transportation (Non-Medical): No  Physical Activity: Not on file  Stress: Not on file  Social Connections: Unknown (03/28/2022)   Received from [facility]   Social Network    Social Network: Not on file  Intimate Partner Violence: Not At Risk (09/30/2022)   Humiliation, Afraid, Rape, and Kick questionnaire    Fear of Current or Ex-Partner: No    Emotionally Abused: No    Physically Abused: No    Sexually Abused: No    FAMILY HISTORY: Family History  Problem Relation Age of Onset   Hypertension Mother    Diabetes Mother    High Cholesterol Mother    Thyroid disease Mother    Depression Mother    Anxiety disorder Mother    Obesity Mother    CVA Father        hemorrhagic   Ovarian cancer Maternal Aunt 35   Colon cancer Maternal Aunt    Breast cancer Cousin 60       maternal first cousin, reports  negative genetic testing    Review of Systems  Constitutional:  Positive for fatigue. Negative for appetite change, chills, fever and unexpected weight change.  HENT:   Negative for hearing loss, lump/mass and trouble swallowing.   Eyes:  Negative for eye problems and icterus.  Respiratory:  Negative for chest tightness, cough and shortness of breath.   Cardiovascular:  Negative for chest pain, leg swelling and palpitations.  Gastrointestinal:  Negative for abdominal distention, abdominal pain, constipation, diarrhea, nausea and vomiting.  Endocrine: Negative for hot flashes.  Genitourinary:  Negative for difficulty urinating.   Musculoskeletal:  Negative for arthralgias.  Skin:  Negative for itching and rash.  Neurological:  Negative for dizziness, extremity weakness, headaches and numbness.  Hematological:  Negative for adenopathy. Does not bruise/bleed easily.  Psychiatric/Behavioral:  Negative for depression. The patient is not nervous/anxious.       PHYSICAL EXAMINATION     Vitals:   01/16/23 0956  BP: (!) 153/83  Pulse: 93  Resp: 18  Temp: 99.3 F (37.4 C)  SpO2: 97%     Physical Exam Constitutional:      General: She is not in acute distress.    Appearance: Normal appearance. She is not toxic-appearing.  HENT:     Head: Normocephalic and atraumatic.     Mouth/Throat:     Mouth: Mucous membranes are moist.     Pharynx: Oropharynx is clear. No oropharyngeal exudate or posterior oropharyngeal erythema.  Eyes:     General: No scleral icterus. Cardiovascular:     Rate and Rhythm: Normal rate and regular rhythm.     Pulses: Normal pulses.     Heart sounds: Normal heart sounds.  Pulmonary:     Effort: Pulmonary effort is normal.     Breath sounds: Normal breath sounds.  Chest:     Comments: Left breast with skin thickness around the areola.  There is slight erythema with this.  There is no palpable mass or nodule around this area. Abdominal:     General:  Abdomen is flat. Bowel sounds are normal. There is no distension.     Palpations: Abdomen is soft.     Tenderness: There is no abdominal tenderness.  Musculoskeletal:        General: No swelling.     Cervical back: Neck supple.  Lymphadenopathy:     Cervical: No cervical adenopathy.  Skin:    General: Skin is warm and dry.     Findings: No rash.  Neurological:     General: No focal deficit present.     Mental Status: She is alert.  Psychiatric:        Mood and Affect: Mood normal.        Behavior: Behavior normal.     LABORATORY DATA:  CBC    Component Value Date/Time   WBC 7.6 01/16/2023 0857   WBC 6.3 01/09/2023 1956   RBC 4.85 01/16/2023 0857   HGB 11.1 (L) 01/16/2023 0857   HGB 12.0 07/21/2021 1511   HCT 35.4 (L) 01/16/2023 0857   HCT 37.6 07/21/2021 1511   PLT 350 01/16/2023 0857   PLT 359 07/21/2021 1511   MCV 73.0 (L) 01/16/2023 0857   MCV 76 (L) 07/21/2021 1511   MCH 22.9 (L) 01/16/2023 0857   MCHC 31.4 01/16/2023 0857   RDW 17.1 (H) 01/16/2023 0857   RDW 15.7 (H) 07/21/2021 1511   LYMPHSABS 1.9 01/16/2023 0857   LYMPHSABS 1.9 07/21/2021 1511   MONOABS 0.6 01/16/2023 0857   EOSABS 0.3 01/16/2023 0857   EOSABS 0.2 07/21/2021 1511   BASOSABS 0.0 01/16/2023 0857   BASOSABS 0.1 07/21/2021 1511    CMP     Component Value Date/Time   NA 140 01/16/2023 0857   NA 139 03/28/2022 0811   K 3.9 01/16/2023 0857   CL 106 01/16/2023 0857   CO2 27 01/16/2023 0857   GLUCOSE 105 (H) 01/16/2023 0857   BUN 9 01/16/2023 0857   BUN 14 03/28/2022 0811   CREATININE 0.81 01/16/2023 0857   CREATININE 0.74 10/09/2012 1537   CALCIUM 8.8 (L) 01/16/2023 0857   PROT 7.0 01/16/2023 0857   PROT 7.1 03/28/2022 0811   ALBUMIN 3.5 01/16/2023 0857   ALBUMIN 4.2 03/28/2022 0811   AST 18 01/16/2023 0857   ALT 23 01/16/2023 0857   ALKPHOS 100 01/16/2023 0857   BILITOT 0.2 (L) 01/16/2023 0857   GFRNONAA >60 01/16/2023 0857   GFRNONAA >89 10/09/2012 1537   GFRAA 96 05/18/2020  1442   GFRAA >89 10/09/2012 1537        ASSESSMENT and THERAPY PLAN:   Malignant neoplasm of upper-inner quadrant of left breast in female, estrogen receptor positive (HCC) Diamond Marshall is a 52 year old woman with stage 4 triple positive breast cancer on treatment with tamoxifen and Herceptin Perjeta.    Stage IV breast cancer: She continues on tamoxifen and Herceptin Perjeta.  She has grade 2 diarrhea but this is likely unrelated to pertuzumab alone, she has underlying diarrhea predominant irritable bowel syndrome but has never seen gastroenterology.  She is currently taking Imodium, Lomotil, Questran and is now taking some pancreatic enzymes which actually have helped her the most so far.  She is not dehydrated, she is able to keep up with her oral hydration.   Left breast thickness and erythema: Skin biopsy showed residual breast cancer.  At this time since she mentions that the skin changes are relatively new and since she has had excellent response elsewhere, we have sent a referral back to Dr. Luisa Hart to see if she may benefit from surgical resection of this area.  She is willing to discuss about it. Iron deficiency: Continue oral iron, will monitor CBC.  Alayla will proceed with treatment today.  We reviewed her labs in detail.  I placed orders for her next echocardiogram and she will return every 3 weeks for Herceptin Perjeta.    All questions were answered. The patient knows to call the clinic with any problems, questions or concerns. We  can certainly see the patient much sooner if necessary.  Total encounter time:30 minutes*in face-to-face visit time, chart review, lab review, care coordination, order entry, and documentation of the encounter time.  *Total Encounter Time as defined by the Centers for Medicare and Medicaid Services includes, in addition to the face-to-face time of a patient visit (documented in the note above) non-face-to-face time: obtaining and reviewing outside  history, ordering and reviewing medications, tests or procedures, care coordination (communications with other health care professionals or caregivers) and documentation in the medical record.

## 2023-01-19 NOTE — Telephone Encounter (Signed)
Pharmacy Patient Advocate Encounter  Received notification from [employer] that Prior Authorization for Xifaxan has been DENIED.  See denial reason below. No denial letter attached in CMM. Will attache denial letter to Media tab once received.  We reviewed the information you provided in support of a request to obtain Xifaxan 200 mg  TABLET under your patient's plan. We are unable to approve this request for the following  reason(s): ? Coverage is provided for the 200 mg strength for the following diagnoses: traveler's  diarrhea small intestine bacterial overgrowth, and chronic antibiotic-dependent  pouchitis. Coverage cannot be authorized at this time

## 2023-01-22 ENCOUNTER — Other Ambulatory Visit: Payer: Self-pay | Admitting: Family Medicine

## 2023-01-22 MED ORDER — COLESTIPOL HCL 5 G PO GRAN: 5.0000 g | GRANULES | Freq: Two times a day (BID) | ORAL | 12 refills | Status: DC

## 2023-01-22 NOTE — Telephone Encounter (Signed)
I sent in a substitute. ?

## 2023-01-23 NOTE — Telephone Encounter (Signed)
Patient aware and verbalizes understanding. 

## 2023-01-26 ENCOUNTER — Inpatient Hospital Stay: Payer: Commercial Managed Care - PPO | Admitting: Hematology and Oncology

## 2023-01-26 ENCOUNTER — Encounter: Payer: Self-pay | Admitting: Hematology and Oncology

## 2023-01-26 ENCOUNTER — Other Ambulatory Visit: Payer: Self-pay | Admitting: *Deleted

## 2023-01-26 ENCOUNTER — Encounter: Payer: Self-pay | Admitting: Surgery

## 2023-01-26 ENCOUNTER — Inpatient Hospital Stay: Payer: Commercial Managed Care - PPO | Attending: Hematology and Oncology | Admitting: Hematology and Oncology

## 2023-01-26 VITALS — BP 142/76 | HR 95 | Temp 97.9°F | Resp 17 | Wt 321.4 lb

## 2023-01-26 DIAGNOSIS — Z800 Family history of malignant neoplasm of digestive organs: Secondary | ICD-10-CM | POA: Insufficient documentation

## 2023-01-26 DIAGNOSIS — M25561 Pain in right knee: Secondary | ICD-10-CM | POA: Insufficient documentation

## 2023-01-26 DIAGNOSIS — E039 Hypothyroidism, unspecified: Secondary | ICD-10-CM | POA: Insufficient documentation

## 2023-01-26 DIAGNOSIS — C771 Secondary and unspecified malignant neoplasm of intrathoracic lymph nodes: Secondary | ICD-10-CM | POA: Insufficient documentation

## 2023-01-26 DIAGNOSIS — G8929 Other chronic pain: Secondary | ICD-10-CM | POA: Diagnosis not present

## 2023-01-26 DIAGNOSIS — Z8041 Family history of malignant neoplasm of ovary: Secondary | ICD-10-CM | POA: Diagnosis not present

## 2023-01-26 DIAGNOSIS — C50212 Malignant neoplasm of upper-inner quadrant of left female breast: Secondary | ICD-10-CM | POA: Insufficient documentation

## 2023-01-26 DIAGNOSIS — Z803 Family history of malignant neoplasm of breast: Secondary | ICD-10-CM | POA: Diagnosis not present

## 2023-01-26 DIAGNOSIS — C770 Secondary and unspecified malignant neoplasm of lymph nodes of head, face and neck: Secondary | ICD-10-CM | POA: Diagnosis not present

## 2023-01-26 DIAGNOSIS — Z5112 Encounter for antineoplastic immunotherapy: Secondary | ICD-10-CM | POA: Diagnosis present

## 2023-01-26 DIAGNOSIS — Z170 Estrogen receptor positive status [ER+]: Secondary | ICD-10-CM | POA: Diagnosis not present

## 2023-01-26 DIAGNOSIS — I10 Essential (primary) hypertension: Secondary | ICD-10-CM | POA: Diagnosis not present

## 2023-01-26 NOTE — Assessment & Plan Note (Signed)
Diamond Marshall is a 52 year old woman with stage 4 triple positive breast cancer on treatment with tamoxifen and Herceptin Perjeta.    Stage IV breast cancer: Given concern for local progression in the left breast with biopsy confirming high-grade residual invasive mammary carcinoma, ER/PR negative HER2 positive, Ki-67 of 60%, we have discussed about switching treatment to Enhertu.  We have discussed about mechanism of action, adverse effects including but not limited to fatigue, nausea, diarrhea, pneumonitis, cytopenias, transaminitis etc.  She is willing to consider this.  Will plan to repeat imaging after 2 cycles.  We have clearly discussed about pneumonitis being fatal in a small percentage of patients. Left breast thickness and erythema: Skin biopsy showed residual breast cancer.  Prognostics came back as ER negative PR negative HER2 3+ by IHC and Ki-67 of 60%.  There are pictures in media

## 2023-01-26 NOTE — Progress Notes (Signed)
DISCONTINUE ON PATHWAY REGIMEN - Breast     Cycle 1: A cycle is 21 days:     Pertuzumab      Trastuzumab-xxxx      Docetaxel    Cycles 2 and beyond: A cycle is every 21 days:     Pertuzumab      Trastuzumab-xxxx      Docetaxel   **Always confirm dose/schedule in your pharmacy ordering system**  REASON: Disease Progression PRIOR TREATMENT: [id number]: Docetaxel + Trastuzumab IV + Pertuzumab IV (THP IV) q21 Days x 6-8 Cycles, Followed by Trastuzumab IV + Pertuzumab IV q21 Days TREATMENT RESPONSE: Progressive Disease (PD)  START ON PATHWAY REGIMEN - Breast     A cycle is every 21 days:     Fam-trastuzumab [id number]-nxki   **Always confirm dose/schedule in your pharmacy ordering system**  Patient Characteristics: Distant Metastases or Locoregional Recurrent Disease - Unresected, M0 or Locally Advanced Unresectable Disease Progressing after Neoadjuvant and Local Therapies, M0, HER2 Positive, ER Negative, Chemotherapy, Second Line Therapeutic Status: Distant Metastases HER2 Status: Positive (+) ER Status: Negative (-) PR Status: Negative (-) Line of Therapy: Second Line Intent of Therapy: Non-Curative / Palliative Intent, Discussed with Patient

## 2023-01-26 NOTE — Progress Notes (Signed)
[facility] Cancer Follow up:    Diamond Claude, MD [address] [place] [address]   DIAGNOSIS:  Cancer Staging  Malignant neoplasm of upper-inner quadrant of left breast in female, estrogen receptor positive (HCC) Staging form: Breast, AJCC 8th Edition - Clinical stage from 06/21/2022: Stage IIIB (cT3, cN3, cM0, G3, ER+, PR+, HER2+) - Unsigned Stage prefix: Initial diagnosis Histologic grading system: 3 grade system Stage used in treatment planning: Yes National guidelines used in treatment planning: Yes Type of national guideline used in treatment planning: NCCN   SUMMARY OF ONCOLOGIC HISTORY: Oncology History  Malignant neoplasm of upper-inner quadrant of left breast in female, estrogen receptor positive (HCC)  06/06/2022 Mammogram   Diagnostic mammogram given palpable mass showed suspicious spiculated left breast mass with surrounding nodularity at 9 o'clock position.  This measures up to 3.1 cm sonographically however due to deep location and patient body habitus the mammographic measurement of 5.8 cm is felt to be more accurate.  At least 2 abnormal lymph nodes in the left axilla.  Possible abnormal palpable left supraclavicular lymph node.   06/06/2022 Breast US   Breast ultrasound confirmed these findings.   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  Prognostic showed ER 65% weak to strong staining, PR 15% weak to moderate staining, Ki-67 of 50% and HER2 positive by IHC at 3+    Genetic Testing   [facility] Custom Panel+RNA was Negative. Report date is 06/28/2022.   The Custom Hereditary Cancers Panel offered by [facility] includes sequencing and/or deletion duplication testing of the following 43 genes: APC, ATM, AXIN2, BAP1, BARD1, BMPR1A, BRCA1, BRCA2, BRIP1, CDH1, CDK4, CDKN2A (p14ARF and  p16INK4a only), CHEK2, CTNNA1, EPCAM (Deletion/duplication testing only), FH, GREM1 (promoter region duplication testing only), HOXB13, KIT, MBD4, MEN1, MLH1, MSH2, MSH3, MSH6, MUTYH, NF1, NHTL1, PALB2, PDGFRA, PMS2, POLD1, POLE, PTEN, RAD51C, RAD51D, SMAD4, SMARCA4. STK11, TP53, TSC1, TSC2, and VHL.    06/29/2022 PET scan   IMPRESSION: 1. Hypermetabolic left breast mass, compatible with primary breast malignancy. 2. Hypermetabolic left cervical, axillary, subpectoral, supraclavicular and prevascular mediastinal lymph nodes, compatible with nodal metastatic disease. 3. No evidence of metastatic disease in the abdomen or pelvis. 4. Aortic Atherosclerosis (ICD10-I70.0).     Electronically Signed   By: Allegra Lai M.D.   On: 06/29/2022 09:32   07/11/2022 -  Chemotherapy   Patient is on Treatment Plan : BREAST DOCEtaxel + Trastuzumab + Pertuzumab (THP) q21d x 8 cycles / Trastuzumab + Pertuzumab q21d x 4 cycles       CURRENT THERAPY: herceptin and perjeta  INTERVAL HISTORY: Diamond Marshall 52 y.o. female returns for follow-up since the visit with Dr. Luisa Hart this morning.  She was very upset because she heard that the breast cancer was HER2 negative this time. She otherwise also feels that the left breast has gotten bigger and heavier.  She denies any other complaints.   Patient Active Problem List   Diagnosis Date Noted   Gastroenteritis 09/29/2022   Port-A-Cath in place 07/07/2022   Genetic testing 06/29/2022   Malignant neoplasm of upper-inner quadrant of left breast in female, estrogen receptor positive (HCC) 06/19/2022   Chronic pain of right knee 03/27/2022   Essential hypertension 03/27/2022   Positive self-administered antigen test for COVID-19 03/15/2021   Prediabetes 07/22/2019   Anxiety and depression 03/15/2016  Vitamin D deficiency 03/15/2016   Metabolic syndrome X 08/07/2013   Elevated random blood glucose level 08/07/2013   Hypertriglyceridemia 08/07/2013   ADD  (attention deficit disorder) 07/24/2013    hyperlipidemia 10/09/2012   Morbid obesity (HCC) 10/09/2012   Hypothyroidism 10/09/2012   B12 deficiency 10/09/2012   Anemia, unspecified 02/12/2011   Dysthymic disorder 02/12/2011   Migraine headache 02/12/2011   Obsessive-compulsive disorder 02/12/2011   Tinnitus 02/12/2011    is allergic to misc. sulfonamide containing compounds, phentermine, bee venom, peanut allergen powder-dnfp, topiramate, septra [sulfamethoxazole-trimethoprim], and sulfa antibiotics.  MEDICAL HISTORY: Past Medical History:  Diagnosis Date   Abdominal hernia    Anemia    b12 deficiency   Anxiety    Arthritis    B12 deficiency    Back pain    Cancer (HCC)    breast cancer   Depression    Family history of adverse reaction to anesthesia    mother vomits after anesthesia   Gallbladder problem    GERD (gastroesophageal reflux disease)    History of hiatal hernia    "small" per patient   Hypertension    Hypothyroidism    Joint pain    Knee pain    Palpitation    "when iron is low"   Pernicious anemia    SOB (shortness of breath)    Thyroid disease    hypothyroidism   Vitamin D deficiency     SURGICAL HISTORY: Past Surgical History:  Procedure Laterality Date   BREAST BIOPSY Left 06/09/2022   [place] LT BREAST BX W LOC DEV 1ST LESION IMG BX SPEC US GUIDE 06/09/2022 [facility] MAMMO[facility]AP[facility]   CESAREAN SECTION  1999   Dilatation and currettagement     for miscarriage 18 years ago   PORTACATH PLACEMENT Right 07/05/2022   Procedure: INSERTION PORT-A-CATH;  Surgeon: Harriette Bouillon, MD;  Location: [facility] OR;  Service: General;  Laterality: Right;    SOCIAL HISTORY: Social History   Socioeconomic History   Marital status: Married    Spouse name: Kathlene November   Number of children: Not on file   Years of education: Not on file   Highest education level: Not on file  Occupational History   Occupation: [employer] copy  Tobacco Use   Smoking status: Never    Smokeless tobacco: Never  Vaping Use   Vaping status: Never Used  Substance and Sexual Activity   Alcohol use: No   Drug use: No   Sexual activity: Yes    Birth control/protection: Pill  Other Topics Concern   Not on file  Social History Narrative   Not on file   Social Determinants of Health   Financial Resource Strain: Not on file  Food Insecurity: No Food Insecurity (09/30/2022)   Hunger Vital Sign    Worried About Running Out of Food in the Last Year: Never true    Ran Out of Food in the Last Year: Never true  Transportation Needs: No Transportation Needs (09/30/2022)   PRAPARE - Administrator, Civil Service (Medical): No    Lack of Transportation (Non-Medical): No  Physical Activity: Not on file  Stress: Not on file  Social Connections: Unknown (03/28/2022)   Received from [facility]   Social Network    Social Network: Not on file  Intimate Partner Violence: Not At Risk (09/30/2022)   Humiliation, Afraid, Rape, and Kick questionnaire    Fear of Current or Ex-Partner: No    Emotionally Abused: No    Physically Abused:  No    Sexually Abused: No    FAMILY HISTORY: Family History  Problem Relation Age of Onset   Hypertension Mother    Diabetes Mother    High Cholesterol Mother    Thyroid disease Mother    Depression Mother    Anxiety disorder Mother    Obesity Mother    CVA Father        hemorrhagic   Ovarian cancer Maternal Aunt 64   Colon cancer Maternal Aunt    Breast cancer Cousin 27       maternal first cousin, reports negative genetic testing    Review of Systems  Constitutional:  Positive for fatigue. Negative for appetite change, chills, fever and unexpected weight change.  HENT:   Negative for hearing loss, lump/mass and trouble swallowing.   Eyes:  Negative for eye problems and icterus.  Respiratory:  Negative for chest tightness, cough and shortness of breath.   Cardiovascular:  Negative for chest pain, leg swelling and palpitations.   Gastrointestinal:  Negative for abdominal distention, abdominal pain, constipation, diarrhea, nausea and vomiting.  Endocrine: Negative for hot flashes.  Genitourinary:  Negative for difficulty urinating.   Musculoskeletal:  Negative for arthralgias.  Skin:  Negative for itching and rash.  Neurological:  Negative for dizziness, extremity weakness, headaches and numbness.  Hematological:  Negative for adenopathy. Does not bruise/bleed easily.  Psychiatric/Behavioral:  Negative for depression. The patient is not nervous/anxious.       PHYSICAL EXAMINATION     Vitals:   01/26/23 1305  BP: (!) 142/76  Pulse: 95  Resp: 17  Temp: 97.9 F (36.6 C)  SpO2: 97%     Physical Exam Constitutional:      General: She is not in acute distress.    Appearance: Normal appearance. She is not toxic-appearing.  HENT:     Head: Normocephalic and atraumatic.     Mouth/Throat:     Mouth: Mucous membranes are moist.     Pharynx: Oropharynx is clear. No oropharyngeal exudate or posterior oropharyngeal erythema.  Eyes:     General: No scleral icterus. Cardiovascular:     Rate and Rhythm: Normal rate and regular rhythm.     Pulses: Normal pulses.     Heart sounds: Normal heart sounds.  Pulmonary:     Effort: Pulmonary effort is normal.     Breath sounds: Normal breath sounds.  Chest:     Comments: Pics in media. No significant change.  Abdominal:     General: Abdomen is flat. Bowel sounds are normal. There is no distension.     Palpations: Abdomen is soft.     Tenderness: There is no abdominal tenderness.  Musculoskeletal:        General: No swelling.     Cervical back: Neck supple.  Lymphadenopathy:     Cervical: No cervical adenopathy.  Skin:    General: Skin is warm and dry.     Findings: No rash.  Neurological:     General: No focal deficit present.     Mental Status: She is alert.  Psychiatric:        Mood and Affect: Mood normal.        Behavior: Behavior normal.      LABORATORY DATA:  CBC    Component Value Date/Time   WBC 7.6 01/16/2023 0857   WBC 6.3 01/09/2023 1956   RBC 4.85 01/16/2023 0857   HGB 11.1 (L) 01/16/2023 0857   HGB 12.0 07/21/2021 1511   HCT 35.4 (  L) 01/16/2023 0857   HCT 37.6 07/21/2021 1511   PLT 350 01/16/2023 0857   PLT 359 07/21/2021 1511   MCV 73.0 (L) 01/16/2023 0857   MCV 76 (L) 07/21/2021 1511   MCH 22.9 (L) 01/16/2023 0857   MCHC 31.4 01/16/2023 0857   RDW 17.1 (H) 01/16/2023 0857   RDW 15.7 (H) 07/21/2021 1511   LYMPHSABS 1.9 01/16/2023 0857   LYMPHSABS 1.9 07/21/2021 1511   MONOABS 0.6 01/16/2023 0857   EOSABS 0.3 01/16/2023 0857   EOSABS 0.2 07/21/2021 1511   BASOSABS 0.0 01/16/2023 0857   BASOSABS 0.1 07/21/2021 1511    CMP     Component Value Date/Time   NA 140 01/16/2023 0857   NA 139 03/28/2022 0811   K 3.9 01/16/2023 0857   CL 106 01/16/2023 0857   CO2 27 01/16/2023 0857   GLUCOSE 105 (H) 01/16/2023 0857   BUN 9 01/16/2023 0857   BUN 14 03/28/2022 0811   CREATININE 0.81 01/16/2023 0857   CREATININE 0.74 10/09/2012 1537   CALCIUM 8.8 (L) 01/16/2023 0857   PROT 7.0 01/16/2023 0857   PROT 7.1 03/28/2022 0811   ALBUMIN 3.5 01/16/2023 0857   ALBUMIN 4.2 03/28/2022 0811   AST 18 01/16/2023 0857   ALT 23 01/16/2023 0857   ALKPHOS 100 01/16/2023 0857   BILITOT 0.2 (L) 01/16/2023 0857   GFRNONAA >60 01/16/2023 0857   GFRNONAA >89 10/09/2012 1537   GFRAA 96 05/18/2020 1442   GFRAA >89 10/09/2012 1537        ASSESSMENT and THERAPY PLAN:   Malignant neoplasm of upper-inner quadrant of left breast in female, estrogen receptor positive (HCC) Diamond Marshall is a 52 year old woman with stage 4 triple positive breast cancer on treatment with tamoxifen and Herceptin Perjeta.    Stage IV breast cancer: Given concern for local progression in the left breast with biopsy confirming high-grade residual invasive mammary carcinoma, ER/PR negative HER2 positive, Ki-67 of 60%, we have discussed about  switching treatment to Enhertu.  We have discussed about mechanism of action, adverse effects including but not limited to fatigue, nausea, diarrhea, pneumonitis, cytopenias, transaminitis etc.  She is willing to consider this.  Will plan to repeat imaging after 2 cycles.  We have clearly discussed about pneumonitis being fatal in a small percentage of patients. Left breast thickness and erythema: Skin biopsy showed residual breast cancer.  Prognostics came back as ER negative PR negative HER2 3+ by IHC and Ki-67 of 60%.  There are pictures in media    All questions were answered. The patient knows to call the clinic with any problems, questions or concerns. We can certainly see the patient much sooner if necessary.  Total encounter time:30 minutes*in face-to-face visit time, chart review, lab review, care coordination, order entry, and documentation of the encounter time.  *Total Encounter Time as defined by the Centers for Medicare and Medicaid Services includes, in addition to the face-to-face time of a patient visit (documented in the note above) non-face-to-face time: obtaining and reviewing outside history, ordering and reviewing medications, tests or procedures, care coordination (communications with other health care professionals or caregivers) and documentation in the medical record.

## 2023-01-27 ENCOUNTER — Other Ambulatory Visit: Payer: Self-pay

## 2023-01-29 ENCOUNTER — Encounter: Payer: Self-pay | Admitting: Hematology and Oncology

## 2023-01-29 ENCOUNTER — Telehealth: Payer: Self-pay

## 2023-01-29 ENCOUNTER — Ambulatory Visit: Payer: Commercial Managed Care - PPO | Admitting: Hematology and Oncology

## 2023-01-29 NOTE — Telephone Encounter (Signed)
Called pt regarding a MyChart message. This RN let pt know that appt was canceled for 01/29/23 per Dr. Al Pimple since she was seen on Friday 01/26/23. Pt verbalized understanding.   Pt also had questions regarding getting her treatment started this week instead of next. Scheduling message sent today by Val, RN to get her rescheduled for this week.  MyChart message sent to pt to let her know to expect a call from scheduling shortly about appointment changes.

## 2023-01-31 ENCOUNTER — Other Ambulatory Visit: Payer: Self-pay

## 2023-01-31 MED FILL — Dexamethasone Sodium Phosphate Inj 100 MG/10ML: INTRAMUSCULAR | Qty: 1 | Status: AC

## 2023-01-31 MED FILL — Fosaprepitant Dimeglumine For IV Infusion 150 MG (Base Eq): INTRAVENOUS | Qty: 5 | Status: AC

## 2023-02-01 ENCOUNTER — Inpatient Hospital Stay: Payer: Commercial Managed Care - PPO

## 2023-02-01 ENCOUNTER — Other Ambulatory Visit: Payer: Self-pay

## 2023-02-01 ENCOUNTER — Inpatient Hospital Stay (HOSPITAL_BASED_OUTPATIENT_CLINIC_OR_DEPARTMENT_OTHER): Payer: Commercial Managed Care - PPO

## 2023-02-01 VITALS — BP 147/71 | HR 82 | Temp 98.6°F | Resp 18

## 2023-02-01 DIAGNOSIS — Z170 Estrogen receptor positive status [ER+]: Secondary | ICD-10-CM

## 2023-02-01 DIAGNOSIS — C50212 Malignant neoplasm of upper-inner quadrant of left female breast: Secondary | ICD-10-CM

## 2023-02-01 DIAGNOSIS — Z95828 Presence of other vascular implants and grafts: Secondary | ICD-10-CM

## 2023-02-01 DIAGNOSIS — Z5112 Encounter for antineoplastic immunotherapy: Secondary | ICD-10-CM | POA: Diagnosis not present

## 2023-02-01 LAB — CBC WITH DIFFERENTIAL (CANCER CENTER ONLY)
Abs Immature Granulocytes: 0.02 10*3/uL (ref 0.00–0.07)
Basophils Absolute: 0 10*3/uL (ref 0.0–0.1)
Basophils Relative: 1 %
Eosinophils Absolute: 0.3 10*3/uL (ref 0.0–0.5)
Eosinophils Relative: 5 %
HCT: 33.2 % — ABNORMAL LOW (ref 36.0–46.0)
Hemoglobin: 9.9 g/dL — ABNORMAL LOW (ref 12.0–15.0)
Immature Granulocytes: 0 %
Lymphocytes Relative: 32 %
Lymphs Abs: 2.1 10*3/uL (ref 0.7–4.0)
MCH: 22 pg — ABNORMAL LOW (ref 26.0–34.0)
MCHC: 29.8 g/dL — ABNORMAL LOW (ref 30.0–36.0)
MCV: 73.9 fL — ABNORMAL LOW (ref 80.0–100.0)
Monocytes Absolute: 0.4 10*3/uL (ref 0.1–1.0)
Monocytes Relative: 6 %
Neutro Abs: 3.6 10*3/uL (ref 1.7–7.7)
Neutrophils Relative %: 56 %
Platelet Count: 355 10*3/uL (ref 150–400)
RBC: 4.49 MIL/uL (ref 3.87–5.11)
RDW: 17.8 % — ABNORMAL HIGH (ref 11.5–15.5)
WBC Count: 6.4 10*3/uL (ref 4.0–10.5)
nRBC: 0 % (ref 0.0–0.2)

## 2023-02-01 LAB — CMP (CANCER CENTER ONLY)
ALT: 19 U/L (ref 0–44)
AST: 30 U/L (ref 15–41)
Albumin: 3.6 g/dL (ref 3.5–5.0)
Alkaline Phosphatase: 86 U/L (ref 38–126)
Anion gap: 7 (ref 5–15)
BUN: 12 mg/dL (ref 6–20)
CO2: 29 mmol/L (ref 22–32)
Calcium: 9 mg/dL (ref 8.9–10.3)
Chloride: 104 mmol/L (ref 98–111)
Creatinine: 0.79 mg/dL (ref 0.44–1.00)
GFR, Estimated: >60 mL/min (ref >60)
Glucose, Bld: 87 mg/dL (ref 70–99)
Potassium: 3.8 mmol/L (ref 3.5–5.1)
Sodium: 140 mmol/L (ref 135–145)
Total Bilirubin: 0.3 mg/dL (ref 0.3–1.2)
Total Protein: 7.1 g/dL (ref 6.5–8.1)

## 2023-02-01 MED ORDER — PALONOSETRON HCL INJECTION 0.25 MG/5ML
0.2500 mg | Freq: Once | INTRAVENOUS | Status: AC
  Administered 2023-02-01: 0.25 mg via INTRAVENOUS
  Filled 2023-02-01: qty 5

## 2023-02-01 MED ORDER — SODIUM CHLORIDE 0.9% FLUSH
10.0000 mL | Freq: Once | INTRAVENOUS | Status: AC
  Administered 2023-02-01: 10 mL

## 2023-02-01 MED ORDER — DEXTROSE 5 % IV SOLN: Freq: Once | INTRAVENOUS | Status: AC

## 2023-02-01 MED ORDER — FAM-TRASTUZUMAB DERUXTECAN-NXKI CHEMO 100 MG IV SOLR
5.4000 mg/kg | Freq: Once | INTRAVENOUS | Status: AC
  Administered 2023-02-01: 800 mg via INTRAVENOUS
  Filled 2023-02-01: qty 40

## 2023-02-01 MED ORDER — DIPHENHYDRAMINE HCL 25 MG PO CAPS
50.0000 mg | ORAL_CAPSULE | Freq: Once | ORAL | Status: AC
  Administered 2023-02-01: 50 mg via ORAL
  Filled 2023-02-01: qty 2

## 2023-02-01 MED ORDER — ACETAMINOPHEN 325 MG PO TABS
650.0000 mg | ORAL_TABLET | Freq: Once | ORAL | Status: AC
  Administered 2023-02-01: 650 mg via ORAL
  Filled 2023-02-01: qty 2

## 2023-02-01 MED ORDER — SODIUM CHLORIDE 0.9% FLUSH
10.0000 mL | INTRAVENOUS | Status: DC | PRN
  Administered 2023-02-01: 10 mL

## 2023-02-01 MED ORDER — SODIUM CHLORIDE 0.9 % IV SOLN
10.0000 mg | Freq: Once | INTRAVENOUS | Status: AC
  Administered 2023-02-01: 10 mg via INTRAVENOUS
  Filled 2023-02-01: qty 10

## 2023-02-01 MED ORDER — SODIUM CHLORIDE 0.9 % IV SOLN
150.0000 mg | Freq: Once | INTRAVENOUS | Status: AC
  Administered 2023-02-01: 150 mg via INTRAVENOUS
  Filled 2023-02-01: qty 150

## 2023-02-01 MED ORDER — HEPARIN SOD (PORK) LOCK FLUSH 100 UNIT/ML IV SOLN
500.0000 [IU] | Freq: Once | INTRAVENOUS | Status: AC | PRN
  Administered 2023-02-01: 500 [IU]

## 2023-02-01 NOTE — Patient Instructions (Signed)
[facility] [facility]  Discharge Instructions: Thank you for choosing [facility] to provide your oncology and hematology care.   If you have a lab appointment with the Cancer Center, please go directly to the Cancer Center and check in at the registration area.   Wear comfortable clothing and clothing appropriate for easy access to any Portacath or PICC line.   We strive to give you quality time with your provider. You may need to reschedule your appointment if you arrive late (15 or more minutes).  Arriving late affects you and other patients whose appointments are after yours.  Also, if you miss three or more appointments without notifying the office, you may be dismissed from the clinic at the provider's discretion.      For prescription refill requests, have your pharmacy contact our office and allow 72 hours for refills to be completed.    Today you received the following chemotherapy and/or immunotherapy agents: Enhertu.       To help prevent nausea and vomiting after your treatment, we encourage you to take your nausea medication as directed.  BELOW ARE SYMPTOMS THAT SHOULD BE REPORTED IMMEDIATELY: *FEVER GREATER THAN 100.4 F (38 C) OR HIGHER *CHILLS OR SWEATING *NAUSEA AND VOMITING THAT IS NOT CONTROLLED WITH YOUR NAUSEA MEDICATION *UNUSUAL SHORTNESS OF BREATH *UNUSUAL BRUISING OR BLEEDING *URINARY PROBLEMS (pain or burning when urinating, or frequent urination) *BOWEL PROBLEMS (unusual diarrhea, constipation, pain near the anus) TENDERNESS IN MOUTH AND THROAT WITH OR WITHOUT PRESENCE OF ULCERS (sore throat, sores in mouth, or a toothache) UNUSUAL RASH, SWELLING OR PAIN  UNUSUAL VAGINAL DISCHARGE OR ITCHING   Items with * indicate a potential emergency and should be followed up as soon as possible or go to the Emergency Department if any problems should occur.  Please show the CHEMOTHERAPY ALERT CARD or IMMUNOTHERAPY ALERT CARD at  check-in to the Emergency Department and triage nurse.  Should you have questions after your visit or need to cancel or reschedule your appointment, please contact [facility]  Dept: [phone]  and follow the prompts.  Office hours are 8:00 a.m. to 4:30 p.m. Monday - Friday. Please note that voicemails left after 4:00 p.m. may not be returned until the following business day.  We are closed weekends and major holidays. You have access to a nurse at all times for urgent questions. Please call the main number to the clinic Dept: [phone] and follow the prompts.   For any non-urgent questions, you may also contact your provider using MyChart. We now offer e-Visits for anyone 34 and older to request care online for non-urgent symptoms. For details visit [email or web address].   Also download the MyChart app! Go to the app store, search "MyChart", open the app, select [facility], and log in with your MyChart username and password.  Fam-Trastuzumab Deruxtecan Injection What is this medication? FAM-TRASTUZUMAB DERUXTECAN (fam-tras TOOZ eu mab DER ux TEE kan) treats some types of cancer. It works by blocking a protein that causes cancer cells to grow and multiply. This helps to slow or stop the spread of cancer cells. This medicine may be used for other purposes; ask your health care provider or pharmacist if you have questions. COMMON BRAND NAME(S): ENHERTU What should I tell my care team before I take this medication? They need to know if you have any of these conditions: Heart disease Heart failure Infection, especially a viral infection, such as chickenpox,  cold sores, or herpes Liver disease Lung or breathing disease, such as asthma or COPD An unusual or allergic reaction to fam-trastuzumab deruxtecan, other medications, foods, dyes, or preservatives Pregnant or trying to get pregnant Breast-feeding How should I use this medication? This medication  is injected into a vein. It is given by your care team in a hospital or clinic setting. A special MedGuide will be given to you before each treatment. Be sure to read this information carefully each time. Talk to your care team about the use of this medication in children. Special care may be needed. Overdosage: If you think you have taken too much of this medicine contact a poison control center or emergency room at once. NOTE: This medicine is only for you. Do not share this medicine with others. What if I miss a dose? It is important not to miss your dose. Call your care team if you are unable to keep an appointment. What may interact with this medication? Interactions are not expected. This list may not describe all possible interactions. Give your health care provider a list of all the medicines, herbs, non-prescription drugs, or dietary supplements you use. Also tell them if you smoke, drink alcohol, or use illegal drugs. Some items may interact with your medicine. What should I watch for while using this medication? Visit your care team for regular checks on your progress. Tell your care team if your symptoms do not start to get better or if they get worse. Your condition will be monitored carefully while you are receiving this medication. Do not become pregnant while taking this medication or for 7 months after stopping it. Women should inform their care team if they wish to become pregnant or think they might be pregnant. Men should not father a child while taking this medication and for 4 months after stopping it. There is potential for serious side effects to an unborn child. Talk to your care team for more information. Do not breast-feed an infant while taking this medication or for 7 months after the last dose. This medication has caused decreased sperm counts in some men. This may make it more difficult to father a child. Talk to your care team if you are concerned about your  fertility. This medication may increase your risk to bruise or bleed. Call your care team if you notice any unusual bleeding. Be careful brushing or flossing your teeth or using a toothpick because you may get an infection or bleed more easily. If you have any dental work done, tell your dentist you are receiving this medication. This medication may cause dry eyes and blurred vision. If you wear contact lenses, you may feel some discomfort. Lubricating eye drops may help. See your care team if the problem does not go away or is severe. This medication may increase your risk of getting an infection. Call your care team for advice if you get a fever, chills, sore throat, or other symptoms of a cold or flu. Do not treat yourself. Try to avoid being around people who are sick. Avoid taking medications that contain aspirin, acetaminophen, ibuprofen, naproxen, or ketoprofen unless instructed by your care team. These medications may hide a fever. What side effects may I notice from receiving this medication? Side effects that you should report to your care team as soon as possible: Allergic reactions--skin rash, itching, hives, swelling of the face, lips, tongue, or throat Dry cough, shortness of breath or trouble breathing Infection--fever, chills, cough, sore throat, wounds  that don't heal, pain or trouble when passing urine, general feeling of discomfort or being unwell Heart failure--shortness of breath, swelling of the ankles, feet, or hands, sudden weight gain, unusual weakness or fatigue Unusual bruising or bleeding Side effects that usually do not require medical attention (report these to your care team if they continue or are bothersome): Constipation Diarrhea Hair loss Muscle pain Nausea Vomiting This list may not describe all possible side effects. Call your doctor for medical advice about side effects. You may report side effects to FDA at 1-800-FDA-1088. Where should I keep my  medication? This medication is given in a hospital or clinic. It will not be stored at home. NOTE: This sheet is a summary. It may not cover all possible information. If you have questions about this medicine, talk to your doctor, pharmacist, or health care provider.  2024 Elsevier/Gold Standard (2021-02-22 00:00:00)

## 2023-02-02 ENCOUNTER — Telehealth: Payer: Self-pay

## 2023-02-02 NOTE — Telephone Encounter (Signed)
Diamond Marshall states that she is doing fine. She is eating, drinking, and urinating well. She knows to call the office at [phone] if  she has any questions or concerns. She wanted to know if she should continue taking her tamoxifen at this time.  Reviewed with Dr. Al Pimple. Told Diamond Marshall that Dr. Al Pimple said to hold tamoxifen for now. Pt. Verbalized understanding.

## 2023-02-02 NOTE — Telephone Encounter (Signed)
Dr. Al Pimple - first time Enhertu f/u call - pt tolerated well Received: Donney Dice, RN  P Onc Triage Nurse [facility] Caller: Unspecified (Yesterday,  5:30 PM)

## 2023-02-03 ENCOUNTER — Encounter (HOSPITAL_COMMUNITY): Payer: Self-pay

## 2023-02-03 ENCOUNTER — Other Ambulatory Visit: Payer: Self-pay

## 2023-02-03 ENCOUNTER — Emergency Department (HOSPITAL_COMMUNITY)
Admission: EM | Admit: 2023-02-03 | Discharge: 2023-02-03 | Disposition: A | Discharge status: Home / Self Care | Payer: Commercial Managed Care - PPO

## 2023-02-03 DIAGNOSIS — R112 Nausea with vomiting, unspecified: Secondary | ICD-10-CM | POA: Insufficient documentation

## 2023-02-03 DIAGNOSIS — D72829 Elevated white blood cell count, unspecified: Secondary | ICD-10-CM | POA: Diagnosis not present

## 2023-02-03 DIAGNOSIS — I10 Essential (primary) hypertension: Secondary | ICD-10-CM | POA: Insufficient documentation

## 2023-02-03 DIAGNOSIS — Z79899 Other long term (current) drug therapy: Secondary | ICD-10-CM | POA: Diagnosis not present

## 2023-02-03 DIAGNOSIS — R197 Diarrhea, unspecified: Secondary | ICD-10-CM | POA: Diagnosis not present

## 2023-02-03 DIAGNOSIS — Z853 Personal history of malignant neoplasm of breast: Secondary | ICD-10-CM | POA: Insufficient documentation

## 2023-02-03 DIAGNOSIS — Z91010 Allergy to peanuts: Secondary | ICD-10-CM | POA: Diagnosis not present

## 2023-02-03 DIAGNOSIS — T451X5A Adverse effect of antineoplastic and immunosuppressive drugs, initial encounter: Secondary | ICD-10-CM | POA: Diagnosis not present

## 2023-02-03 DIAGNOSIS — R1110 Vomiting, unspecified: Secondary | ICD-10-CM

## 2023-02-03 DIAGNOSIS — E039 Hypothyroidism, unspecified: Secondary | ICD-10-CM | POA: Insufficient documentation

## 2023-02-03 LAB — LIPASE, BLOOD: Lipase: 26 U/L (ref 11–51)

## 2023-02-03 LAB — CBC
HCT: 40.9 % (ref 36.0–46.0)
Hemoglobin: 12 g/dL (ref 12.0–15.0)
MCH: 22.4 pg — ABNORMAL LOW (ref 26.0–34.0)
MCHC: 29.3 g/dL — ABNORMAL LOW (ref 30.0–36.0)
MCV: 76.3 fL — ABNORMAL LOW (ref 80.0–100.0)
Platelets: 410 10*3/uL — ABNORMAL HIGH (ref 150–400)
RBC: 5.36 MIL/uL — ABNORMAL HIGH (ref 3.87–5.11)
RDW: 18.8 % — ABNORMAL HIGH (ref 11.5–15.5)
WBC: 11.5 10*3/uL — ABNORMAL HIGH (ref 4.0–10.5)
nRBC: 0 % (ref 0.0–0.2)

## 2023-02-03 LAB — COMPREHENSIVE METABOLIC PANEL
ALT: 24 U/L (ref 0–44)
AST: 25 U/L (ref 15–41)
Albumin: 3.5 g/dL (ref 3.5–5.0)
Alkaline Phosphatase: 91 U/L (ref 38–126)
Anion gap: 12 (ref 5–15)
BUN: 26 mg/dL — ABNORMAL HIGH (ref 6–20)
CO2: 25 mmol/L (ref 22–32)
Calcium: 8.9 mg/dL (ref 8.9–10.3)
Chloride: 103 mmol/L (ref 98–111)
Creatinine, Ser: 0.86 mg/dL (ref 0.44–1.00)
GFR, Estimated: >60 mL/min (ref >60)
Glucose, Bld: 118 mg/dL — ABNORMAL HIGH (ref 70–99)
Potassium: 3.7 mmol/L (ref 3.5–5.1)
Sodium: 140 mmol/L (ref 135–145)
Total Bilirubin: 0.4 mg/dL (ref 0.3–1.2)
Total Protein: 7.6 g/dL (ref 6.5–8.1)

## 2023-02-03 MED ORDER — LOPERAMIDE HCL 2 MG PO CAPS
2.0000 mg | ORAL_CAPSULE | Freq: Once | ORAL | Status: AC
  Administered 2023-02-03: 2 mg via ORAL
  Filled 2023-02-03: qty 1

## 2023-02-03 MED ORDER — SODIUM CHLORIDE 0.9 % IV SOLN
12.5000 mg | Freq: Once | INTRAVENOUS | Status: AC
  Administered 2023-02-03: 12.5 mg via INTRAVENOUS
  Filled 2023-02-03: qty 12.5

## 2023-02-03 MED ORDER — LACTATED RINGERS IV BOLUS
1000.0000 mL | Freq: Once | INTRAVENOUS | Status: AC
  Administered 2023-02-03: 1000 mL via INTRAVENOUS

## 2023-02-03 NOTE — ED Triage Notes (Signed)
Patient reports nausea, vomiting, and diarrhea since 0600 this AM. Started new chemo medication Thursday and given multiple nausea meds. Patient attempted to take compazine, but could not keep it down. Reports they told here she could not take Zofran for another 48 hrs after Chemo.  VSS, NAD

## 2023-02-03 NOTE — Discharge Instructions (Addendum)
You are seen in the department for vomiting and diarrhea.  As we discussed your lab work looked reassuring.  We gave you IV fluids and nausea medication called Phenergan.  I am glad that your nausea is feeling improved.  I recommend taking your prescribed vomiting and diarrhea medications and following up with your oncologist as scheduled.  Continue to monitor how you're doing and return to the ER for new or worsening symptoms.

## 2023-02-03 NOTE — ED Provider Notes (Signed)
[place] EMERGENCY DEPARTMENT AT [facility] Provider Note   CSN: [id number] Arrival date & time: 02/03/23  1028     History  Chief Complaint  Patient presents with   Emesis    Diamond Marshall is a 52 y.o. female with history of anxiety, GERD, hypothyroidism, depression, B12 deficiency, HTN, breast cancer currently on chemotherapy who presents to the ER complaining of nausea, vomiting, and diarrhea since this AM. Reports 8 episodes of watery diarrhea, 5 episodes of nonbloody nonbilous emesis. Feels very weak and dehydrated. Started new chemotherapy regimen two days ago and has tried nausea medications at home. Could not keep down compazine. States she was told she could not take zofran for 48 hours post chemotherapy. Reports previous negative c diff and GI pathogen panel testing. She feels her diarrhea is the same as it has been before with chemotherapy. Denies fever or abdominal pain.   HPI     Home Medications Prior to Admission medications   Medication Sig Start Date End Date Taking? Authorizing Provider  colestipol (COLESTID) 5 g granules Take 5 g by mouth 2 (two) times daily. 01/22/23   Mechele Claude, MD  cyanocobalamin (,VITAMIN B-12,) 1000 MCG/ML injection INJECT 1 ML INTRAMUSCULARLY EVERY 15 DAYS Patient taking differently: Inject 1,000 mcg into the muscle every 30 (thirty) days. 08/19/21   Mechele Claude, MD  diclofenac (VOLTAREN) 75 MG EC tablet TAKE ONE TABLET TWICE DAILY AS NEEDED. needs appointment FOR refills 11/15/22   Mechele Claude, MD  diphenoxylate-atropine (LOMOTIL) 2.5-0.025 MG tablet TAKE TWO TABLETS EVERY 6 HOURS AS NEEDED FOR DIARRHEA 10/03/22   Loa Socks, NP  EPINEPHrine 0.3 mg/0.3 mL IJ SOAJ injection Inject 0.3 mg into the muscle as needed for anaphylaxis. 12/16/21   Gwenlyn Fudge, FNP  escitalopram (LEXAPRO) 20 MG tablet Take 1 tablet (20 mg total) by mouth daily. 01/03/23   Mechele Claude, MD  hydrocortisone (ANUSOL-HC) 2.5 % rectal  cream Place 1 Application rectally 2 (two) times daily. 01/09/23   Garlon Hatchet, PA-C  lidocaine (XYLOCAINE) 2 % jelly 1 Application by Other route as needed. 01/09/23   Garlon Hatchet, PA-C  lisinopril (ZESTRIL) 20 MG tablet Take 1 tablet (20 mg total) by mouth daily. 01/03/23   Mechele Claude, MD  LORazepam (ATIVAN) 0.5 MG tablet Take 1 tablet (0.5 mg total) by mouth every 8 (eight) hours as needed for anxiety. 06/22/22   Rachel Moulds, MD  methylPREDNISolone (MEDROL DOSEPAK) 4 MG TBPK tablet Take as directed 01/05/23   Loa Socks, NP  pantoprazole (PROTONIX) 40 MG tablet Take 1 tablet (40 mg total) by mouth daily. 01/03/23   Mechele Claude, MD  rifaximin (XIFAXAN) 200 MG tablet Take 1 tablet (200 mg total) by mouth 3 (three) times daily. For chemo related diarrhea 01/03/23   Mechele Claude, MD  tamoxifen (NOLVADEX) 20 MG tablet Take 1 tablet (20 mg total) by mouth daily. 11/14/22   Rachel Moulds, MD      Allergies    Misc. sulfonamide containing compounds, Phentermine, Bee venom, Peanut allergen powder-dnfp, Topiramate, Septra [sulfamethoxazole-trimethoprim], and Sulfa antibiotics    Review of Systems   Review of Systems  Constitutional:  Negative for fever.  Gastrointestinal:  Positive for diarrhea, nausea and vomiting. Negative for abdominal pain.  All other systems reviewed and are negative.   Physical Exam Updated Vital Signs BP 115/64   Pulse 86   Temp 97.8 F (36.6 C) (Oral)   Resp 17   SpO2 95%  Physical  Exam Vitals and nursing note reviewed.  Constitutional:      Appearance: Normal appearance. She is obese.  HENT:     Head: Normocephalic and atraumatic.  Eyes:     Conjunctiva/sclera: Conjunctivae normal.  Cardiovascular:     Rate and Rhythm: Normal rate and regular rhythm.  Pulmonary:     Effort: Pulmonary effort is normal. No respiratory distress.     Breath sounds: Normal breath sounds.  Abdominal:     General: There is no distension.      Palpations: Abdomen is soft.     Tenderness: There is no abdominal tenderness.  Skin:    General: Skin is warm and dry.  Neurological:     General: No focal deficit present.     Mental Status: She is alert.     ED Results / Procedures / Treatments   Labs (all labs ordered are listed, but only abnormal results are displayed) Labs Reviewed  CBC - Abnormal; Notable for the following components:      Result Value   WBC 11.5 (*)    RBC 5.36 (*)    MCV 76.3 (*)    MCH 22.4 (*)    MCHC 29.3 (*)    RDW 18.8 (*)    Platelets 410 (*)    All other components within normal limits  COMPREHENSIVE METABOLIC PANEL - Abnormal; Notable for the following components:   Glucose, Bld 118 (*)    BUN 26 (*)    All other components within normal limits  LIPASE, BLOOD    EKG EKG Interpretation Date/Time:  Saturday February 03 2023 11:13:52 EDT Ventricular Rate:  94 PR Interval:  142 QRS Duration:  94 QT Interval:  365 QTC Calculation: 457 R Axis:   22  Text Interpretation: Sinus rhythm Inferior infarct, old Confirmed by Estanislado Pandy [phone]) on 02/03/2023 2:42:04 PM  Radiology No results found.  Procedures Procedures    Medications Ordered in ED Medications  lactated ringers bolus 1,000 mL (0 mLs Intravenous Stopped 02/03/23 1545)  promethazine (PHENERGAN) 12.5 mg in sodium chloride 0.9 % 50 mL IVPB (0 mg Intravenous Stopped 02/03/23 1349)  loperamide (IMODIUM) capsule 2 mg (2 mg Oral Given 02/03/23 1218)    ED Course/ Medical Decision Making/ A&P Clinical Course as of 02/03/23 1613  Sat Feb 03, 2023  1200 On evaluation patient states that her nausea has significantly improved, however she did just have a large loose bowel movement. [LR]    Clinical Course User Index [LR] Gavriella Hearst, Lora Paula, PA-C                                 Medical Decision Making Amount and/or Complexity of Data Reviewed Labs: ordered.  Risk Prescription drug management.   This patient is a 52 y.o.  female  who presents to the ED for concern of vomiting and diarrhea.   Differential diagnoses prior to evaluation: The emergent differential diagnosis includes, but is not limited to,  Infectious diarrhea (viral, bacterial), GI Bleed, Appendicitis, Mesenteric Ischemia, Diverticulitis, endocrine causes (adrenal, thyroid), Toxicologic, IBD, adverse effect of chemotherapy. This is not an exhaustive differential.   Past Medical History / Co-morbidities / Social History: anxiety, GERD, hypothyroidism, depression, B12 deficiency, HTN, breast cancer currently on chemotherapy  Additional history: Chart reviewed. Pertinent results include: Per chart review, patient follows with Dr. Al Pimple with oncology, had a change to her chemotherapy regimen and received first infusion 2 days ago.  Physical Exam: Physical exam performed. The pertinent findings include: Normal vital signs, no acute distress.  Abdomen soft and nontender.  Lab Tests/Imaging studies: I personally interpreted labs/imaging and the pertinent results include: Leukocytosis of 11.5, hemoglobin is stable.  CMP grossly unremarkable.  Normal lipase.  As I have low concern for acute intra-abdominal infection and patient has benign abdominal exam with reassuring laboratory evaluation, will defer abdominal imaging at this time.  Cardiac monitoring: EKG obtained and interpreted by myself and attending physician which shows: Sinus rhythm, QTc 457   Medications: I ordered medication including IV fluids, Phenergan, Imodium.  I have reviewed the patients home medicines and have made adjustments as needed.  Upon reevaluation, patient has continued to have loose stools, however her nausea is significantly improved, with no repeat episodes of emesis in the ER.   Disposition: After consideration of the diagnostic results and the patients response to treatment, I feel that emergency department workup does not suggest an emergent condition requiring  admission or immediate intervention beyond what has been performed at this time. The plan is: Discharge to home with symptomatic management of vomiting and diarrhea, likely side effect of chemotherapy.  Patient is clinically well-appearing, labs reassuring.  Passed a p.o. challenge, and vomiting improved after antiemetics.  She feels she could tolerate home diarrhea medications orally, and feels comfortable going home.. The patient is safe for discharge and has been instructed to return immediately for worsening symptoms, change in symptoms or any other concerns.  Final Clinical Impression(s) / ED Diagnoses Final diagnoses:  Vomiting and diarrhea  Adverse effect of chemotherapy, initial encounter    Rx / DC Orders ED Discharge Orders     None      Portions of this report may have been transcribed using voice recognition software. Every effort was made to ensure accuracy; however, inadvertent computerized transcription errors may be present.    Su Monks, PA-C 02/03/23 1613    Coral Spikes, DO 02/04/23 [phone]

## 2023-02-05 ENCOUNTER — Telehealth: Payer: Self-pay

## 2023-02-05 DIAGNOSIS — K580 Irritable bowel syndrome with diarrhea: Secondary | ICD-10-CM

## 2023-02-05 NOTE — Telephone Encounter (Signed)
Referral sent to [facility] office in [place]. Successful [place] noticed received. This RN contacted office and was informed that she would be scheduled for a first visit and also be put on the patient call list. Office also stated that they would notify pt.

## 2023-02-05 NOTE — Telephone Encounter (Signed)
This RN received a telephone record from 02/03/23 regarding the pt experiencing vomiting/diarrhea and going to the ED due to symptoms not being controlled. Per the telephone note, the pt has 8 episodes of diarrhea and 5 episodes of emesis.   This RN called pt back to see how she is feeling. Pt states today she is feeling a "little weak and tired" but is able to keep down fluids and food. Pt states that she did return to work today and is feeling better. Pt denies any other symptoms at this time.   This RN informed patient to please call back or send a MyChart message if she has any further issues or feels her symptoms are returning and not improving. This RN also informed pt to please return to ED if symptoms come back and are not controlled. Pt stated understanding.    This RN informed Dr. Al Pimple of pt's ED visit and requested a referral to GI.

## 2023-02-06 ENCOUNTER — Encounter: Payer: Self-pay | Admitting: Physician Assistant

## 2023-02-06 ENCOUNTER — Other Ambulatory Visit: Payer: Commercial Managed Care - PPO

## 2023-02-06 ENCOUNTER — Ambulatory Visit: Payer: Commercial Managed Care - PPO

## 2023-02-06 ENCOUNTER — Ambulatory Visit: Payer: Commercial Managed Care - PPO | Admitting: Adult Health

## 2023-02-06 ENCOUNTER — Encounter (HOSPITAL_COMMUNITY): Payer: Self-pay | Admitting: Emergency Medicine

## 2023-02-06 ENCOUNTER — Emergency Department (HOSPITAL_COMMUNITY)
Admission: EM | Admit: 2023-02-06 | Discharge: 2023-02-07 | Disposition: A | Discharge status: Home / Self Care | Payer: Commercial Managed Care - PPO | Attending: Emergency Medicine | Admitting: Emergency Medicine

## 2023-02-06 ENCOUNTER — Other Ambulatory Visit: Payer: Self-pay

## 2023-02-06 DIAGNOSIS — R509 Fever, unspecified: Secondary | ICD-10-CM | POA: Diagnosis not present

## 2023-02-06 DIAGNOSIS — I10 Essential (primary) hypertension: Secondary | ICD-10-CM | POA: Insufficient documentation

## 2023-02-06 DIAGNOSIS — T451X5A Adverse effect of antineoplastic and immunosuppressive drugs, initial encounter: Secondary | ICD-10-CM | POA: Diagnosis not present

## 2023-02-06 DIAGNOSIS — Z20822 Contact with and (suspected) exposure to covid-19: Secondary | ICD-10-CM | POA: Insufficient documentation

## 2023-02-06 DIAGNOSIS — R7309 Other abnormal glucose: Secondary | ICD-10-CM | POA: Diagnosis not present

## 2023-02-06 DIAGNOSIS — E039 Hypothyroidism, unspecified: Secondary | ICD-10-CM | POA: Insufficient documentation

## 2023-02-06 DIAGNOSIS — Z91010 Allergy to peanuts: Secondary | ICD-10-CM | POA: Diagnosis not present

## 2023-02-06 DIAGNOSIS — R112 Nausea with vomiting, unspecified: Secondary | ICD-10-CM | POA: Diagnosis present

## 2023-02-06 DIAGNOSIS — R197 Diarrhea, unspecified: Secondary | ICD-10-CM | POA: Insufficient documentation

## 2023-02-06 DIAGNOSIS — Z79899 Other long term (current) drug therapy: Secondary | ICD-10-CM | POA: Insufficient documentation

## 2023-02-06 MED ORDER — LACTATED RINGERS IV BOLUS
1000.0000 mL | Freq: Once | INTRAVENOUS | Status: AC
  Administered 2023-02-07: 1000 mL via INTRAVENOUS

## 2023-02-06 MED ORDER — SODIUM CHLORIDE 0.9 % IV SOLN
12.5000 mg | Freq: Once | INTRAVENOUS | Status: AC
  Administered 2023-02-07: 12.5 mg via INTRAVENOUS
  Filled 2023-02-06: qty 12.5

## 2023-02-06 NOTE — ED Provider Notes (Signed)
[place] EMERGENCY DEPARTMENT AT [facility] Provider Note   CSN: [id number] Arrival date & time: 02/06/23  2202     History  Chief Complaint  Patient presents with   Fever    Diamond Marshall is a 52 y.o. female with history anxiety, GERD, hypothyroidism, depression, Baytril deficiency, hypertension, breast cancer currently on chemotherapy.  Patient presents to ED for evaluation of nausea vomiting and diarrhea.  Patient reports that on Thursday she began a new bout of chemotherapy which included a loading dose and infusion.  She states that she was advised that this medication will cause her to become weak, malaise, nauseous.  She states that she was seen here in this ED on 9/14 for the above-stated symptoms.  She was sent home after having her symptoms controlled with Phenergan, 1 L LR.  She returns today complaining of nausea, continued diarrhea.  States that she has had diarrhea since February.  Denies abdominal pain, flank pain, dysuria, chest pain, shortness of breath.  She reports that she has tried taking at home Compazine, Zofran which is not relieving her symptoms.  She denies any vomiting.  Denies any change to her baseline diarrhea which has been present since February.   Fever Associated symptoms: diarrhea and nausea   Associated symptoms: no chest pain, no dysuria and no vomiting        Home Medications Prior to Admission medications   Medication Sig Start Date End Date Taking? Authorizing Provider  promethazine (PHENERGAN) 12.5 MG suppository Place 1 suppository (12.5 mg total) rectally every 6 (six) hours as needed for nausea or vomiting. 02/07/23  Yes Al Decant, PA-C  colestipol (COLESTID) 5 g granules Take 5 g by mouth 2 (two) times daily. 01/22/23   Mechele Claude, MD  cyanocobalamin (,VITAMIN B-12,) 1000 MCG/ML injection INJECT 1 ML INTRAMUSCULARLY EVERY 15 DAYS Patient taking differently: Inject 1,000 mcg into the muscle every 30 (thirty) days.  08/19/21   Mechele Claude, MD  diclofenac (VOLTAREN) 75 MG EC tablet TAKE ONE TABLET TWICE DAILY AS NEEDED. needs appointment FOR refills 11/15/22   Mechele Claude, MD  diphenoxylate-atropine (LOMOTIL) 2.5-0.025 MG tablet TAKE TWO TABLETS EVERY 6 HOURS AS NEEDED FOR DIARRHEA 10/03/22   Loa Socks, NP  EPINEPHrine 0.3 mg/0.3 mL IJ SOAJ injection Inject 0.3 mg into the muscle as needed for anaphylaxis. 12/16/21   Gwenlyn Fudge, FNP  escitalopram (LEXAPRO) 20 MG tablet Take 1 tablet (20 mg total) by mouth daily. 01/03/23   Mechele Claude, MD  hydrocortisone (ANUSOL-HC) 2.5 % rectal cream Place 1 Application rectally 2 (two) times daily. 01/09/23   Garlon Hatchet, PA-C  lidocaine (XYLOCAINE) 2 % jelly 1 Application by Other route as needed. 01/09/23   Garlon Hatchet, PA-C  lisinopril (ZESTRIL) 20 MG tablet Take 1 tablet (20 mg total) by mouth daily. 01/03/23   Mechele Claude, MD  LORazepam (ATIVAN) 0.5 MG tablet Take 1 tablet (0.5 mg total) by mouth every 8 (eight) hours as needed for anxiety. 06/22/22   Rachel Moulds, MD  methylPREDNISolone (MEDROL DOSEPAK) 4 MG TBPK tablet Take as directed 01/05/23   Loa Socks, NP  pantoprazole (PROTONIX) 40 MG tablet Take 1 tablet (40 mg total) by mouth daily. 01/03/23   Mechele Claude, MD  rifaximin (XIFAXAN) 200 MG tablet Take 1 tablet (200 mg total) by mouth 3 (three) times daily. For chemo related diarrhea 01/03/23   Mechele Claude, MD  tamoxifen (NOLVADEX) 20 MG tablet Take 1 tablet (20  mg total) by mouth daily. 11/14/22   Rachel Moulds, MD      Allergies    Misc. sulfonamide containing compounds, Phentermine, Bee venom, Peanut allergen powder-dnfp, Topiramate, Septra [sulfamethoxazole-trimethoprim], and Sulfa antibiotics    Review of Systems   Review of Systems  Constitutional:  Positive for fever.  Respiratory:  Negative for shortness of breath.   Cardiovascular:  Negative for chest pain.  Gastrointestinal:  Positive for diarrhea  and nausea. Negative for abdominal pain and vomiting.  Genitourinary:  Negative for dysuria and flank pain.  All other systems reviewed and are negative.   Physical Exam Updated Vital Signs BP (!) 148/76   Pulse 92   Temp 98.3 F (36.8 C) (Oral)   Resp 18   Ht 5\' 3"  (1.6 m)   Wt (!) 145.7 kg   SpO2 100%   BMI 56.90 kg/m  Physical Exam Vitals and nursing note reviewed.  Constitutional:      General: She is not in acute distress.    Appearance: Normal appearance. She is not ill-appearing, toxic-appearing or diaphoretic.  HENT:     Head: Normocephalic and atraumatic.     Nose: Nose normal.     Mouth/Throat:     Mouth: Mucous membranes are moist.     Pharynx: Oropharynx is clear.  Eyes:     Extraocular Movements: Extraocular movements intact.     Conjunctiva/sclera: Conjunctivae normal.     Pupils: Pupils are equal, round, and reactive to light.  Cardiovascular:     Rate and Rhythm: Normal rate and regular rhythm.  Pulmonary:     Effort: Pulmonary effort is normal.     Breath sounds: Normal breath sounds. No wheezing.  Abdominal:     General: Abdomen is flat. Bowel sounds are normal.     Palpations: Abdomen is soft.     Tenderness: There is no abdominal tenderness.  Musculoskeletal:     Cervical back: Normal range of motion and neck supple. No tenderness.     Right lower leg: No edema.     Left lower leg: No edema.  Skin:    General: Skin is warm and dry.     Capillary Refill: Capillary refill takes less than 2 seconds.  Neurological:     Mental Status: She is alert and oriented to person, place, and time.     ED Results / Procedures / Treatments   Labs (all labs ordered are listed, but only abnormal results are displayed) Labs Reviewed  CBC WITH DIFFERENTIAL/PLATELET - Abnormal; Notable for the following components:      Result Value   Hemoglobin 9.9 (*)    HCT 33.1 (*)    MCV 73.9 (*)    MCH 22.1 (*)    MCHC 29.9 (*)    RDW 17.6 (*)    All other  components within normal limits  COMPREHENSIVE METABOLIC PANEL - Abnormal; Notable for the following components:   Glucose, Bld 116 (*)    Calcium 8.4 (*)    Total Protein 6.4 (*)    Albumin 2.8 (*)    All other components within normal limits  URINALYSIS, ROUTINE W REFLEX MICROSCOPIC - Abnormal; Notable for the following components:   Leukocytes,Ua MODERATE (*)    All other components within normal limits  RESP PANEL BY RT-PCR (RSV, FLU A&B, COVID)  RVPGX2  LIPASE, BLOOD    EKG None  Radiology No results found.  Procedures Procedures   Medications Ordered in ED Medications  sodium chloride flush (NS) 0.9 %  injection 10-40 mL (has no administration in time range)  sodium chloride flush (NS) 0.9 % injection 10-40 mL (has no administration in time range)  Chlorhexidine Gluconate Cloth 2 % PADS 6 each (has no administration in time range)  lactated ringers bolus 1,000 mL (1,000 mLs Intravenous New Bag/Given 02/07/23 0120)  promethazine (PHENERGAN) 12.5 mg in sodium chloride 0.9 % 50 mL IVPB (0 mg Intravenous Stopped 02/07/23 0249)    ED Course/ Medical Decision Making/ A&P  Medical Decision Making Amount and/or Complexity of Data Reviewed Labs: ordered.  Risk OTC drugs.   52 year old female presents to the ED for evaluation.  Please see HPI for further details.  On examination, patient is afebrile, nontachycardic.  Lung sounds are clear bilaterally, nonhypoxic.  Abdomen is soft and compressible throughout.  Neurological examination baseline.  Overall nontoxic in appearance.  Will provide patient with 1L LR, 12.5 mg of Phenergan.  Will collect labs to include CBC, CMP, viral panel, lipase, urinalysis.  Patient CBC with baseline hemoglobin, no leukocytosis.  Metabolic panel with glucose 116 however no electrolyte derangement, no elevated LFTs, no anion gap elevation, no creatinine elevation.  Patient lipase WNL.  Viral panel negative for all.  Urinalysis negative for  all.  On reassessment patient reports that she feels much better at this time.  States her nausea has decreased.  Will send her home with rectal Phenergan.  Will have her follow-up with cancer center.  Advised to return to the ED with any new or worsening symptoms such as vomiting and she voiced understanding.  She is agreeable to the plan.  She is stable to discharge home.   Final Clinical Impression(s) / ED Diagnoses Final diagnoses:  Fever, unspecified fever cause  Chemotherapy-induced nausea    Rx / DC Orders ED Discharge Orders          Ordered    promethazine (PHENERGAN) 12.5 MG suppository  Every 6 hours PRN        02/07/23 0345              Al Decant, PA-C 02/07/23 0346    Shon Baton, MD 02/08/23 [phone]

## 2023-02-06 NOTE — ED Provider Notes (Incomplete)
[place] EMERGENCY DEPARTMENT AT [facility] Provider Note   CSN: [id number] Arrival date & time: 02/06/23  2202     History  Chief Complaint  Patient presents with  . Fever    Diamond Marshall is a 52 y.o. female with history anxiety, GERD, hypothyroidism, depression, Baytril deficiency, hypertension, breast cancer currently on chemotherapy.  Patient presents to ED for evaluation of nausea vomiting and diarrhea.  Patient reports that on Thursday she began a new bout of chemotherapy which included a loading dose and infusion.  She states that she was advised that this medication will cause her to become weak, malaise, nauseous.  She states that she was seen here in this ED on 9/14 for the above-stated symptoms.  She was sent home after having her symptoms controlled with Phenergan, 1 L LR.  She returns today complaining of nausea, continued diarrhea.  States that she has had diarrhea since February.  Denies abdominal pain, flank pain, dysuria, chest pain, shortness of breath.  She reports that she has tried taking at home Compazine, Zofran which is not relieving her symptoms.  She denies any vomiting.  Denies any change to her baseline diarrhea which has been present since February.   Fever      Home Medications Prior to Admission medications   Medication Sig Start Date End Date Taking? Authorizing Provider  colestipol (COLESTID) 5 g granules Take 5 g by mouth 2 (two) times daily. 01/22/23   Mechele Claude, MD  cyanocobalamin (,VITAMIN B-12,) 1000 MCG/ML injection INJECT 1 ML INTRAMUSCULARLY EVERY 15 DAYS Patient taking differently: Inject 1,000 mcg into the muscle every 30 (thirty) days. 08/19/21   Mechele Claude, MD  diclofenac (VOLTAREN) 75 MG EC tablet TAKE ONE TABLET TWICE DAILY AS NEEDED. needs appointment FOR refills 11/15/22   Mechele Claude, MD  diphenoxylate-atropine (LOMOTIL) 2.5-0.025 MG tablet TAKE TWO TABLETS EVERY 6 HOURS AS NEEDED FOR DIARRHEA 10/03/22   Loa Socks, NP  EPINEPHrine 0.3 mg/0.3 mL IJ SOAJ injection Inject 0.3 mg into the muscle as needed for anaphylaxis. 12/16/21   Gwenlyn Fudge, FNP  escitalopram (LEXAPRO) 20 MG tablet Take 1 tablet (20 mg total) by mouth daily. 01/03/23   Mechele Claude, MD  hydrocortisone (ANUSOL-HC) 2.5 % rectal cream Place 1 Application rectally 2 (two) times daily. 01/09/23   Garlon Hatchet, PA-C  lidocaine (XYLOCAINE) 2 % jelly 1 Application by Other route as needed. 01/09/23   Garlon Hatchet, PA-C  lisinopril (ZESTRIL) 20 MG tablet Take 1 tablet (20 mg total) by mouth daily. 01/03/23   Mechele Claude, MD  LORazepam (ATIVAN) 0.5 MG tablet Take 1 tablet (0.5 mg total) by mouth every 8 (eight) hours as needed for anxiety. 06/22/22   Rachel Moulds, MD  methylPREDNISolone (MEDROL DOSEPAK) 4 MG TBPK tablet Take as directed 01/05/23   Loa Socks, NP  pantoprazole (PROTONIX) 40 MG tablet Take 1 tablet (40 mg total) by mouth daily. 01/03/23   Mechele Claude, MD  rifaximin (XIFAXAN) 200 MG tablet Take 1 tablet (200 mg total) by mouth 3 (three) times daily. For chemo related diarrhea 01/03/23   Mechele Claude, MD  tamoxifen (NOLVADEX) 20 MG tablet Take 1 tablet (20 mg total) by mouth daily. 11/14/22   Rachel Moulds, MD      Allergies    Misc. sulfonamide containing compounds, Phentermine, Bee venom, Peanut allergen powder-dnfp, Topiramate, Septra [sulfamethoxazole-trimethoprim], and Sulfa antibiotics    Review of Systems   Review of Systems  Constitutional:  Positive for fever.    Physical Exam Updated Vital Signs BP (!) 133/93   Pulse (!) 103   Temp 99 F (37.2 C) (Oral)   Resp 19   Ht 5\' 3"  (1.6 m)   Wt (!) 145.7 kg   SpO2 95%   BMI 56.90 kg/m  Physical Exam  ED Results / Procedures / Treatments   Labs (all labs ordered are listed, but only abnormal results are displayed) Labs Reviewed - No data to display  EKG None  Radiology No results found.  Procedures Procedures   {Document cardiac monitor, telemetry assessment procedure when appropriate:1}  Medications Ordered in ED Medications  lactated ringers bolus 1,000 mL (has no administration in time range)  promethazine (PHENERGAN) 12.5 mg in sodium chloride 0.9 % 50 mL IVPB (has no administration in time range)    ED Course/ Medical Decision Making/ A&P   {   Click here for ABCD2, HEART and other calculatorsREFRESH Note before signing :1}                              Medical Decision Making Amount and/or Complexity of Data Reviewed Labs: ordered.   ***  {Document critical care time when appropriate:1} {Document review of labs and clinical decision tools ie heart score, Chads2Vasc2 etc:1}  {Document your independent review of radiology images, and any outside records:1} {Document your discussion with family members, caretakers, and with consultants:1} {Document social determinants of health affecting pt's care:1} {Document your decision making why or why not admission, treatments were needed:1} Final Clinical Impression(s) / ED Diagnoses Final diagnoses:  None    Rx / DC Orders ED Discharge Orders     None

## 2023-02-06 NOTE — ED Notes (Signed)
Collected urine from pt

## 2023-02-06 NOTE — ED Triage Notes (Signed)
Pt c/o fever (100.3) at home with N/V. Pt was seen here Saturday for the same. Last chemo was Thursday 9/12.

## 2023-02-07 ENCOUNTER — Other Ambulatory Visit: Payer: Self-pay

## 2023-02-07 ENCOUNTER — Encounter: Payer: Self-pay | Admitting: Hematology and Oncology

## 2023-02-07 DIAGNOSIS — K580 Irritable bowel syndrome with diarrhea: Secondary | ICD-10-CM

## 2023-02-07 DIAGNOSIS — C50212 Malignant neoplasm of upper-inner quadrant of left female breast: Secondary | ICD-10-CM

## 2023-02-07 DIAGNOSIS — Z170 Estrogen receptor positive status [ER+]: Secondary | ICD-10-CM

## 2023-02-07 MED ORDER — CHLORHEXIDINE GLUCONATE CLOTH 2 % EX PADS: 6.0000 | MEDICATED_PAD | Freq: Every day | CUTANEOUS | Status: DC

## 2023-02-07 MED ORDER — SODIUM CHLORIDE 0.9% FLUSH: 10.0000 mL | INTRAVENOUS | Status: DC | PRN

## 2023-02-07 MED ORDER — SODIUM CHLORIDE 0.9% FLUSH: 10.0000 mL | Freq: Two times a day (BID) | INTRAVENOUS | Status: DC

## 2023-02-07 MED ORDER — HEPARIN SOD (PORK) LOCK FLUSH 100 UNIT/ML IV SOLN
500.0000 [IU] | Freq: Once | INTRAVENOUS | Status: AC
  Administered 2023-02-07: 500 [IU]
  Filled 2023-02-07: qty 5

## 2023-02-07 MED ORDER — PROMETHAZINE HCL 12.5 MG RE SUPP: 12.5000 mg | Freq: Four times a day (QID) | RECTAL | 0 refills | Status: DC | PRN

## 2023-02-07 NOTE — ED Notes (Signed)
Attempted for IV x 2, unsuccessful

## 2023-02-07 NOTE — Discharge Instructions (Addendum)
It was a pleasure taking part in your care today.  As we discussed, your workup is largely reassuring.  I am sending you home with Phenergan suppositories.  You may take these every 6 hours as needed for nausea.  Please follow-up with your PCP and oncology team for further management and care.  Please continue hydrating yourself at home.  Please return to the ED with any new or worsening signs or symptoms.

## 2023-02-08 ENCOUNTER — Inpatient Hospital Stay: Payer: Commercial Managed Care - PPO

## 2023-02-08 ENCOUNTER — Inpatient Hospital Stay (HOSPITAL_BASED_OUTPATIENT_CLINIC_OR_DEPARTMENT_OTHER): Payer: Commercial Managed Care - PPO | Admitting: Physician Assistant

## 2023-02-08 VITALS — HR 94 | Temp 98.2°F | Resp 18

## 2023-02-08 VITALS — BP 117/74 | HR 100 | Temp 99.2°F | Resp 18 | Wt 306.9 lb

## 2023-02-08 DIAGNOSIS — Z95828 Presence of other vascular implants and grafts: Secondary | ICD-10-CM

## 2023-02-08 DIAGNOSIS — Z5112 Encounter for antineoplastic immunotherapy: Secondary | ICD-10-CM | POA: Diagnosis not present

## 2023-02-08 DIAGNOSIS — R197 Diarrhea, unspecified: Secondary | ICD-10-CM | POA: Diagnosis not present

## 2023-02-08 DIAGNOSIS — R112 Nausea with vomiting, unspecified: Secondary | ICD-10-CM

## 2023-02-08 DIAGNOSIS — C50212 Malignant neoplasm of upper-inner quadrant of left female breast: Secondary | ICD-10-CM | POA: Diagnosis not present

## 2023-02-08 DIAGNOSIS — Z170 Estrogen receptor positive status [ER+]: Secondary | ICD-10-CM

## 2023-02-08 DIAGNOSIS — K580 Irritable bowel syndrome with diarrhea: Secondary | ICD-10-CM

## 2023-02-08 LAB — CBC WITH DIFFERENTIAL (CANCER CENTER ONLY)
Abs Immature Granulocytes: 0.02 10*3/uL (ref 0.00–0.07)
Basophils Absolute: 0 10*3/uL (ref 0.0–0.1)
Basophils Relative: 1 %
Eosinophils Absolute: 0.3 10*3/uL (ref 0.0–0.5)
Eosinophils Relative: 4 %
HCT: 35.5 % — ABNORMAL LOW (ref 36.0–46.0)
Hemoglobin: 11 g/dL — ABNORMAL LOW (ref 12.0–15.0)
Immature Granulocytes: 0 %
Lymphocytes Relative: 25 %
Lymphs Abs: 1.5 10*3/uL (ref 0.7–4.0)
MCH: 22.4 pg — ABNORMAL LOW (ref 26.0–34.0)
MCHC: 31 g/dL (ref 30.0–36.0)
MCV: 72.4 fL — ABNORMAL LOW (ref 80.0–100.0)
Monocytes Absolute: 0.1 10*3/uL (ref 0.1–1.0)
Monocytes Relative: 1 %
Neutro Abs: 4.1 10*3/uL (ref 1.7–7.7)
Neutrophils Relative %: 69 %
Platelet Count: 230 10*3/uL (ref 150–400)
RBC: 4.9 MIL/uL (ref 3.87–5.11)
RDW: 17.5 % — ABNORMAL HIGH (ref 11.5–15.5)
WBC Count: 5.9 10*3/uL (ref 4.0–10.5)
nRBC: 0 % (ref 0.0–0.2)

## 2023-02-08 LAB — CMP (CANCER CENTER ONLY)
ALT: 29 U/L (ref 0–44)
AST: 28 U/L (ref 15–41)
Albumin: 3.5 g/dL (ref 3.5–5.0)
Alkaline Phosphatase: 103 U/L (ref 38–126)
Anion gap: 7 (ref 5–15)
BUN: 13 mg/dL (ref 6–20)
CO2: 27 mmol/L (ref 22–32)
Calcium: 8.9 mg/dL (ref 8.9–10.3)
Chloride: 103 mmol/L (ref 98–111)
Creatinine: 0.8 mg/dL (ref 0.44–1.00)
GFR, Estimated: >60 mL/min (ref >60)
Glucose, Bld: 118 mg/dL — ABNORMAL HIGH (ref 70–99)
Potassium: 3.7 mmol/L (ref 3.5–5.1)
Sodium: 137 mmol/L (ref 135–145)
Total Bilirubin: 0.4 mg/dL (ref 0.3–1.2)
Total Protein: 7.1 g/dL (ref 6.5–8.1)

## 2023-02-08 LAB — MAGNESIUM: Magnesium: 1.7 mg/dL (ref 1.7–2.4)

## 2023-02-08 MED ORDER — SODIUM CHLORIDE 0.9% FLUSH
10.0000 mL | Freq: Once | INTRAVENOUS | Status: AC
  Administered 2023-02-08: 10 mL

## 2023-02-08 MED ORDER — SODIUM CHLORIDE 0.9 % IV SOLN: Freq: Once | INTRAVENOUS | Status: AC

## 2023-02-08 MED ORDER — HEPARIN SOD (PORK) LOCK FLUSH 100 UNIT/ML IV SOLN
500.0000 [IU] | Freq: Once | INTRAVENOUS | Status: AC
  Administered 2023-02-08: 500 [IU]

## 2023-02-08 NOTE — Patient Instructions (Signed)

## 2023-02-08 NOTE — Progress Notes (Signed)
Symptom Management Consult Note [facility]    Patient Care Team: Diamond Claude, MD as PCP - General (Family Medicine) Diamond Bouillon, MD as Consulting Physician (General Surgery) Diamond Moulds, MD as Consulting Physician (Hematology and Oncology) Diamond Peak, MD as Attending Physician (Radiation Oncology) Diamond Proud, RN as Oncology Nurse Navigator Diamond Angelica, RN as Oncology Nurse Navigator    Name / MRN / DOB: Diamond [facility]  [id number]  [birth year]   Date of visit: 02/08/2023   Chief Complaint/Reason for visit: diarrhea   Current Therapy: Enhertu  Last treatment:  Day 1   Cycle 1 on 02/01/23   ASSESSMENT & PLAN: Patient is a 52 y.o. female with oncologic history of malignant neoplasm of upper-inner quadrant of left breast in female, estrogen receptor positive followed by Dr. Al [facility].  I have viewed most recent oncology note and lab work.    #Malignant neoplasm of upper-inner quadrant of left breast in female, estrogen receptor positive  - Next appointment with oncologist is 02/22/23.  #Diarrhea -Resolved as of today. Advised to treat with Imodium if diarrhea returns. -When present recently diarrhea was grade 2. -Abdominal exam is benign. CBC and CMP is overall unremarkable. -Patient received 1 L normal saline in clinic for hydration support.  #Nausea -Resolved.  No antiemetic needed during visit today. -Discussed how to take Compazine and Zofran if nausea returns.   Strict ED precautions discussed should symptoms worsen.   Heme/Onc History: Oncology History  Malignant neoplasm of upper-inner quadrant of left breast in female, estrogen receptor positive ([facility])  06/06/2022 Mammogram   Diagnostic mammogram given palpable mass showed suspicious spiculated left breast mass with surrounding nodularity at 9 o'clock position.  This measures up to 3.1 cm sonographically however due to deep location and patient body habitus the mammographic  measurement of 5.8 cm is felt to be more accurate.  At least 2 abnormal lymph nodes in the left axilla.  Possible abnormal palpable left supraclavicular lymph node.   06/06/2022 Breast US   Breast ultrasound confirmed these findings.   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  Prognostic showed ER 65% weak to strong staining, PR 15% weak to moderate staining, Ki-67 of 50% and HER2 positive by IHC at 3+    Genetic Testing   [facility] Custom Panel+RNA was Negative. Report date is 06/28/2022.   The Custom Hereditary Cancers Panel offered by [facility] includes sequencing and/or deletion duplication testing of the following 43 genes: APC, ATM, AXIN2, BAP1, BARD1, BMPR1A, BRCA1, BRCA2, BRIP1, CDH1, CDK4, CDKN2A (p14ARF and p16INK4a only), CHEK2, CTNNA1, EPCAM (Deletion/duplication testing only), FH, GREM1 (promoter region duplication testing only), HOXB13, KIT, MBD4, MEN1, MLH1, MSH2, MSH3, MSH6, MUTYH, NF1, NHTL1, PALB2, PDGFRA, PMS2, POLD1, POLE, PTEN, RAD51C, RAD51D, SMAD4, SMARCA4. STK11, TP53, TSC1, TSC2, and VHL.    06/29/2022 PET scan   IMPRESSION: 1. Hypermetabolic left breast mass, compatible with primary breast malignancy. 2. Hypermetabolic left cervical, axillary, subpectoral, supraclavicular and prevascular mediastinal lymph nodes, compatible with nodal metastatic disease. 3. No evidence of metastatic disease in the abdomen or pelvis. 4. Aortic Atherosclerosis (ICD10-I70.0).     Electronically Signed   By: [facility] M.D.   On: 06/29/2022 09:32   07/11/2022 - 01/16/2023 Chemotherapy   Patient is on Treatment Plan : BREAST DOCEtaxel + Trastuzumab + Pertuzumab (THP) q21d x  8 cycles / Trastuzumab + Pertuzumab q21d x 4 cycles     02/01/2023 -  Chemotherapy   Patient is on Treatment Plan : BREAST METASTATIC  Fam-Trastuzumab Deruxtecan-nxki (Enhertu) (5.4) q21d         Interval history-: Diamond [facility] is a 52 y.o. female with oncologic history as above presenting to [facility] today with chief complaint of diarrhea.  She is accompanied by her father who provides additional history.  Patient reports she has had diarrhea x 6 days.  Patient states her symptoms started on Friday night when she had 4 episodes of emesis and 7 episodes of diarrhea.  Over the weekend she had persistent nausea and diarrhea that she describes as liquid stool.  2 days ago patient began vomiting again and still was having diarrhea.  She has been taking Zofran and Compazine sparingly. She recently resumed the super enzyme complex she had been taking to help with her diarrhea several months ago.  She has also been taking Imodium.  She states her bowel movement today was soft although formed.  She is feeling much better this morning.  Patient tries to stay hydrated although had little p.o. intake over the weekend because she was feeling so poorly.     ROS  All other systems are reviewed and are negative for acute change except as noted in the HPI.    Allergies  Allergen Reactions   Misc. Sulfonamide Containing Compounds Rash and Itching   Phentermine     Became suicidal on the medication   Bee Venom Swelling   Peanut Allergen Powder-Dnfp Other (See Comments)    Migraine   Topiramate Other (See Comments)    dizziness   Septra [Sulfamethoxazole-Trimethoprim] Rash   Sulfa Antibiotics Rash     Past Medical History:  Diagnosis Date   Abdominal hernia    Anemia    b12 deficiency   Anxiety    Arthritis    B12 deficiency    Back pain    Cancer ([facility])    breast cancer   Depression    Family history of adverse reaction to anesthesia    mother vomits after anesthesia   Gallbladder problem    GERD (gastroesophageal reflux disease)    History of hiatal hernia    "small" per patient   Hypertension    Hypothyroidism     Joint pain    Knee pain    Palpitation    "when iron is low"   Pernicious anemia    SOB (shortness of breath)    Thyroid disease    hypothyroidism   Vitamin D deficiency      Past Surgical History:  Procedure Laterality Date   BREAST BIOPSY Left 06/09/2022   [place] LT BREAST BX W LOC DEV 1ST LESION IMG BX SPEC US GUIDE 06/09/2022 [facility]   CESAREAN SECTION  1999   Dilatation and currettagement     for miscarriage 18 years ago   PORTACATH PLACEMENT Right 07/05/2022   Procedure: INSERTION PORT-A-CATH;  Surgeon: Diamond Bouillon, MD;  Location: [facility] OR;  Service: General;  Laterality: Right;    Social History   Socioeconomic History   Marital status: Married    Spouse name: Kathlene November   Number of children: Not on file   Years of education: Not on file   Highest education level: Not on file  Occupational History   Occupation: [employer] copy  Tobacco Use   Smoking status: Never   Smokeless tobacco: Never  Vaping Use  Vaping status: Never Used  Substance and Sexual Activity   Alcohol use: No   Drug use: No   Sexual activity: Yes    Birth control/protection: Pill  Other Topics Concern   Not on file  Social History Narrative   Not on file   Social Determinants of Health   Financial Resource Strain: Not on file  Food Insecurity: No Food Insecurity (09/30/2022)   Hunger Vital Sign    Worried About Running Out of Food in the Last Year: Never true    Ran Out of Food in the Last Year: Never true  Transportation Needs: No Transportation Needs (09/30/2022)   PRAPARE - Administrator, Civil Service (Medical): No    Lack of Transportation (Non-Medical): No  Physical Activity: Not on file  Stress: Not on file  Social Connections: Unknown (03/28/2022)   Received from [facility]   Social Network    Social Network: Not on file  Intimate Partner Violence: Not At Risk (09/30/2022)   Humiliation, Afraid, Rape, and Kick questionnaire    Fear  of Current or Ex-Partner: No    Emotionally Abused: No    Physically Abused: No    Sexually Abused: No    Family History  Problem Relation Age of Onset   Hypertension Mother    Diabetes Mother    High Cholesterol Mother    Thyroid disease Mother    Depression Mother    Anxiety disorder Mother    Obesity Mother    CVA Father        hemorrhagic   Ovarian cancer Maternal Aunt 72   Colon cancer Maternal Aunt    Breast cancer Cousin 107       maternal first cousin, reports negative genetic testing     Current Outpatient Medications:    colestipol (COLESTID) 5 g granules, Take 5 g by mouth 2 (two) times daily., Disp: 500 g, Rfl: 12   cyanocobalamin (,VITAMIN B-12,) 1000 MCG/ML injection, INJECT 1 ML INTRAMUSCULARLY EVERY 15 DAYS (Patient taking differently: Inject 1,000 mcg into the muscle every 30 (thirty) days.), Disp: 6 mL, Rfl: 1   diclofenac (VOLTAREN) 75 MG EC tablet, TAKE ONE TABLET TWICE DAILY AS NEEDED. needs appointment FOR refills, Disp: 180 tablet, Rfl: 1   diphenoxylate-atropine (LOMOTIL) 2.5-0.025 MG tablet, TAKE TWO TABLETS EVERY 6 HOURS AS NEEDED FOR DIARRHEA, Disp: 30 tablet, Rfl: 1   EPINEPHrine 0.3 mg/0.3 mL IJ SOAJ injection, Inject 0.3 mg into the muscle as needed for anaphylaxis., Disp: 2 each, Rfl: 0   escitalopram (LEXAPRO) 20 MG tablet, Take 1 tablet (20 mg total) by mouth daily., Disp: 90 tablet, Rfl: 3   hydrocortisone (ANUSOL-HC) 2.5 % rectal cream, Place 1 Application rectally 2 (two) times daily., Disp: 30 g, Rfl: 2   lidocaine (XYLOCAINE) 2 % jelly, 1 Application by Other route as needed., Disp: 30 mL, Rfl: 2   lisinopril (ZESTRIL) 20 MG tablet, Take 1 tablet (20 mg total) by mouth daily., Disp: 90 tablet, Rfl: 1   LORazepam (ATIVAN) 0.5 MG tablet, Take 1 tablet (0.5 mg total) by mouth every 8 (eight) hours as needed for anxiety., Disp: 30 tablet, Rfl: 1   methylPREDNISolone (MEDROL DOSEPAK) 4 MG TBPK tablet, Take as directed, Disp: 21 tablet, Rfl: 0    pantoprazole (PROTONIX) 40 MG tablet, Take 1 tablet (40 mg total) by mouth daily., Disp: 90 tablet, Rfl: 3   promethazine (PHENERGAN) 12.5 MG suppository, Place 1 suppository (12.5 mg total) rectally every  6 (six) hours as needed for nausea or vomiting., Disp: 12 suppository, Rfl: 0   rifaximin (XIFAXAN) 200 MG tablet, Take 1 tablet (200 mg total) by mouth 3 (three) times daily. For chemo related diarrhea, Disp: 90 tablet, Rfl: 5   tamoxifen (NOLVADEX) 20 MG tablet, Take 1 tablet (20 mg total) by mouth daily., Disp: 90 tablet, Rfl: 3  PHYSICAL EXAM: ECOG FS:1 - Symptomatic but completely ambulatory    Vitals:   02/08/23 1341  BP: 117/74  Pulse: 100  Resp: 18  Temp: 99.2 F (37.3 C)  TempSrc: Oral  SpO2: 96%  Weight: (!) 306 lb 14.4 oz (139.2 kg)   Physical Exam Vitals and nursing note reviewed.  Constitutional:      Appearance: She is not ill-appearing or toxic-appearing.  HENT:     Head: Normocephalic.  Eyes:     Conjunctiva/sclera: Conjunctivae normal.  Cardiovascular:     Rate and Rhythm: Normal rate and regular rhythm.     Pulses: Normal pulses.     Heart sounds: Normal heart sounds.  Pulmonary:     Effort: Pulmonary effort is normal.     Breath sounds: Normal breath sounds.  Abdominal:     General: There is no distension.  Musculoskeletal:     Cervical back: Normal range of motion.  Skin:    General: Skin is warm and dry.  Neurological:     Mental Status: She is alert.        LABORATORY DATA: I have reviewed the data as listed    Latest Ref Rng & Units 02/08/2023    1:24 PM 02/07/2023    2:50 AM 02/03/2023   11:14 AM  CBC  WBC 4.0 - 10.5 K/uL 5.9  6.8  11.5   Hemoglobin 12.0 - 15.0 g/dL 14.7  9.9  82.9   Hematocrit 36.0 - 46.0 % 35.5  33.1  40.9   Platelets 150 - 400 K/uL 230  272  410         Latest Ref Rng & Units 02/08/2023    1:24 PM 02/07/2023    2:50 AM 02/03/2023   11:14 AM  CMP  Glucose 70 - 99 mg/dL 562  130  865   BUN 6 - 20 mg/dL 13  15   26    Creatinine 0.44 - 1.00 mg/dL 7.84  6.96  2.95   Sodium 135 - 145 mmol/L 137  136  140   Potassium 3.5 - 5.1 mmol/L 3.7  3.6  3.7   Chloride 98 - 111 mmol/L 103  104  103   CO2 22 - 32 mmol/L 27  24  25    Calcium 8.9 - 10.3 mg/dL 8.9  8.4  8.9   Total Protein 6.5 - 8.1 g/dL 7.1  6.4  7.6   Total Bilirubin 0.3 - 1.2 mg/dL 0.4  0.4  0.4   Alkaline Phos 38 - 126 U/L 103  86  91   AST 15 - 41 U/L 28  32  25   ALT 0 - 44 U/L 29  29  24         RADIOGRAPHIC STUDIES (from last 24 hours if applicable) I have personally reviewed the radiological images as listed and agreed with the findings in the report. No results found.      Visit Diagnosis: 1. Diarrhea, unspecified type   2. Malignant neoplasm of upper-inner quadrant of left breast in female, estrogen receptor positive ([facility])   3. Nausea and vomiting, unspecified vomiting type  No orders of the defined types were placed in this encounter.   All questions were answered. The patient knows to call the clinic with any problems, questions or concerns. No barriers to learning was detected.  A total of more than 20 minutes were spent on this encounter with face-to-face time and non-face-to-face time, including preparing to see the patient, ordering tests and/or medications, counseling the patient and coordination of care as outlined above.    Thank you for allowing me to participate in the care of this patient.    Shanon Ace, PA-C Department of Hematology/Oncology [facility] at [facility] Phone: [phone]  [phone]    02/08/2023 4:10 PM

## 2023-02-09 ENCOUNTER — Telehealth: Payer: Self-pay

## 2023-02-09 NOTE — Telephone Encounter (Signed)
This RN LVM for pt to follow up and see how she is feeling since her symptom management appt yesterday. Call back number [phone] provided.

## 2023-02-12 ENCOUNTER — Telehealth: Payer: Self-pay

## 2023-02-12 NOTE — Telephone Encounter (Signed)
This RN called pt to follow up to see how she is feeling since her [facility] appt on Thursday. Pt states that she is feeling much better. This RN informed pt to please call if she has any questions or concerns that arise before her appt with Mardella Layman on 10/03. Pt verbalized understanding.

## 2023-02-16 ENCOUNTER — Encounter: Payer: Self-pay | Admitting: Physician Assistant

## 2023-02-21 MED FILL — Fosaprepitant Dimeglumine For IV Infusion 150 MG (Base Eq): INTRAVENOUS | Qty: 5 | Status: AC

## 2023-02-21 MED FILL — Dexamethasone Sodium Phosphate Inj 100 MG/10ML: INTRAMUSCULAR | Qty: 1 | Status: AC

## 2023-02-22 ENCOUNTER — Encounter: Payer: Self-pay | Admitting: Adult Health

## 2023-02-22 ENCOUNTER — Inpatient Hospital Stay (HOSPITAL_BASED_OUTPATIENT_CLINIC_OR_DEPARTMENT_OTHER): Payer: Commercial Managed Care - PPO | Admitting: Adult Health

## 2023-02-22 ENCOUNTER — Inpatient Hospital Stay: Payer: Commercial Managed Care - PPO | Attending: Hematology and Oncology

## 2023-02-22 ENCOUNTER — Inpatient Hospital Stay: Payer: Commercial Managed Care - PPO

## 2023-02-22 VITALS — BP 154/85 | HR 73 | Temp 98.9°F | Resp 15 | Wt 308.0 lb

## 2023-02-22 DIAGNOSIS — Z5112 Encounter for antineoplastic immunotherapy: Secondary | ICD-10-CM | POA: Diagnosis present

## 2023-02-22 DIAGNOSIS — R197 Diarrhea, unspecified: Secondary | ICD-10-CM | POA: Diagnosis not present

## 2023-02-22 DIAGNOSIS — E876 Hypokalemia: Secondary | ICD-10-CM | POA: Diagnosis not present

## 2023-02-22 DIAGNOSIS — I10 Essential (primary) hypertension: Secondary | ICD-10-CM | POA: Diagnosis not present

## 2023-02-22 DIAGNOSIS — Z803 Family history of malignant neoplasm of breast: Secondary | ICD-10-CM | POA: Diagnosis not present

## 2023-02-22 DIAGNOSIS — G8929 Other chronic pain: Secondary | ICD-10-CM | POA: Diagnosis not present

## 2023-02-22 DIAGNOSIS — Z170 Estrogen receptor positive status [ER+]: Secondary | ICD-10-CM

## 2023-02-22 DIAGNOSIS — C771 Secondary and unspecified malignant neoplasm of intrathoracic lymph nodes: Secondary | ICD-10-CM | POA: Diagnosis not present

## 2023-02-22 DIAGNOSIS — C770 Secondary and unspecified malignant neoplasm of lymph nodes of head, face and neck: Secondary | ICD-10-CM | POA: Insufficient documentation

## 2023-02-22 DIAGNOSIS — Z95828 Presence of other vascular implants and grafts: Secondary | ICD-10-CM

## 2023-02-22 DIAGNOSIS — E039 Hypothyroidism, unspecified: Secondary | ICD-10-CM | POA: Insufficient documentation

## 2023-02-22 DIAGNOSIS — C50212 Malignant neoplasm of upper-inner quadrant of left female breast: Secondary | ICD-10-CM

## 2023-02-22 DIAGNOSIS — Z8041 Family history of malignant neoplasm of ovary: Secondary | ICD-10-CM | POA: Diagnosis not present

## 2023-02-22 DIAGNOSIS — M25561 Pain in right knee: Secondary | ICD-10-CM | POA: Diagnosis not present

## 2023-02-22 DIAGNOSIS — Z800 Family history of malignant neoplasm of digestive organs: Secondary | ICD-10-CM | POA: Insufficient documentation

## 2023-02-22 LAB — CBC WITH DIFFERENTIAL (CANCER CENTER ONLY)
Abs Immature Granulocytes: 0.04 10*3/uL (ref 0.00–0.07)
Basophils Absolute: 0.1 10*3/uL (ref 0.0–0.1)
Basophils Relative: 1 %
Eosinophils Absolute: 0.2 10*3/uL (ref 0.0–0.5)
Eosinophils Relative: 4 %
HCT: 32 % — ABNORMAL LOW (ref 36.0–46.0)
Hemoglobin: 10.2 g/dL — ABNORMAL LOW (ref 12.0–15.0)
Immature Granulocytes: 1 %
Lymphocytes Relative: 38 %
Lymphs Abs: 2.1 10*3/uL (ref 0.7–4.0)
MCH: 23.6 pg — ABNORMAL LOW (ref 26.0–34.0)
MCHC: 31.9 g/dL (ref 30.0–36.0)
MCV: 73.9 fL — ABNORMAL LOW (ref 80.0–100.0)
Monocytes Absolute: 0.4 10*3/uL (ref 0.1–1.0)
Monocytes Relative: 7 %
Neutro Abs: 2.8 10*3/uL (ref 1.7–7.7)
Neutrophils Relative %: 49 %
Platelet Count: 519 10*3/uL — ABNORMAL HIGH (ref 150–400)
RBC: 4.33 MIL/uL (ref 3.87–5.11)
RDW: 20.9 % — ABNORMAL HIGH (ref 11.5–15.5)
WBC Count: 5.6 10*3/uL (ref 4.0–10.5)
nRBC: 0 % (ref 0.0–0.2)

## 2023-02-22 LAB — CMP (CANCER CENTER ONLY)
ALT: 24 U/L (ref 0–44)
AST: 28 U/L (ref 15–41)
Albumin: 3.4 g/dL — ABNORMAL LOW (ref 3.5–5.0)
Alkaline Phosphatase: 107 U/L (ref 38–126)
Anion gap: 7 (ref 5–15)
BUN: 7 mg/dL (ref 6–20)
CO2: 28 mmol/L (ref 22–32)
Calcium: 8.6 mg/dL — ABNORMAL LOW (ref 8.9–10.3)
Chloride: 107 mmol/L (ref 98–111)
Creatinine: 0.94 mg/dL (ref 0.44–1.00)
GFR, Estimated: >60 mL/min (ref >60)
Glucose, Bld: 124 mg/dL — ABNORMAL HIGH (ref 70–99)
Potassium: 3.2 mmol/L — ABNORMAL LOW (ref 3.5–5.1)
Sodium: 142 mmol/L (ref 135–145)
Total Bilirubin: 0.2 mg/dL — ABNORMAL LOW (ref 0.3–1.2)
Total Protein: 6.5 g/dL (ref 6.5–8.1)

## 2023-02-22 MED ORDER — SODIUM CHLORIDE 0.9% FLUSH
10.0000 mL | INTRAVENOUS | Status: DC | PRN
  Administered 2023-02-22: 10 mL

## 2023-02-22 MED ORDER — ACETAMINOPHEN 325 MG PO TABS
650.0000 mg | ORAL_TABLET | Freq: Once | ORAL | Status: AC
  Administered 2023-02-22: 650 mg via ORAL
  Filled 2023-02-22: qty 2

## 2023-02-22 MED ORDER — DIPHENHYDRAMINE HCL 25 MG PO CAPS
50.0000 mg | ORAL_CAPSULE | Freq: Once | ORAL | Status: AC
  Administered 2023-02-22: 50 mg via ORAL
  Filled 2023-02-22: qty 2

## 2023-02-22 MED ORDER — SODIUM CHLORIDE 0.9 % IV SOLN
10.0000 mg | Freq: Once | INTRAVENOUS | Status: AC
  Administered 2023-02-22: 10 mg via INTRAVENOUS
  Filled 2023-02-22: qty 10

## 2023-02-22 MED ORDER — POTASSIUM CHLORIDE ER 10 MEQ PO TBCR
20.0000 meq | EXTENDED_RELEASE_TABLET | Freq: Every day | ORAL | 0 refills | Status: DC
Start: 2023-02-22 — End: 2023-05-09

## 2023-02-22 MED ORDER — SODIUM CHLORIDE 0.9 % IV SOLN
150.0000 mg | Freq: Once | INTRAVENOUS | Status: AC
  Administered 2023-02-22: 150 mg via INTRAVENOUS
  Filled 2023-02-22: qty 150

## 2023-02-22 MED ORDER — FAM-TRASTUZUMAB DERUXTECAN-NXKI CHEMO 100 MG IV SOLR
5.4000 mg/kg | Freq: Once | INTRAVENOUS | Status: AC
  Administered 2023-02-22: 800 mg via INTRAVENOUS
  Filled 2023-02-22: qty 40

## 2023-02-22 MED ORDER — PALONOSETRON HCL INJECTION 0.25 MG/5ML
0.2500 mg | Freq: Once | INTRAVENOUS | Status: AC
  Administered 2023-02-22: 0.25 mg via INTRAVENOUS
  Filled 2023-02-22: qty 5

## 2023-02-22 MED ORDER — DEXTROSE 5 % IV SOLN: Freq: Once | INTRAVENOUS | Status: AC

## 2023-02-22 MED ORDER — HEPARIN SOD (PORK) LOCK FLUSH 100 UNIT/ML IV SOLN
500.0000 [IU] | Freq: Once | INTRAVENOUS | Status: AC | PRN
  Administered 2023-02-22: 500 [IU]

## 2023-02-22 MED ORDER — SODIUM CHLORIDE 0.9% FLUSH
10.0000 mL | Freq: Once | INTRAVENOUS | Status: AC
  Administered 2023-02-22: 10 mL

## 2023-02-22 NOTE — Patient Instructions (Signed)
[facility]  Discharge Instructions: Thank you for choosing [facility] to provide your oncology and hematology care.   If you have a lab appointment with the Cancer Center, please go directly to the Cancer Center and check in at the registration area.   Wear comfortable clothing and clothing appropriate for easy access to any Portacath or PICC line.   We strive to give you quality time with your provider. You may need to reschedule your appointment if you arrive late (15 or more minutes).  Arriving late affects you and other patients whose appointments are after yours.  Also, if you miss three or more appointments without notifying the office, you may be dismissed from the clinic at the provider's discretion.      For prescription refill requests, have your pharmacy contact our office and allow 72 hours for refills to be completed.    Today you received the following chemotherapy and/or immunotherapy agents Enhertu.   To help prevent nausea and vomiting after your treatment, we encourage you to take your nausea medication as directed.  BELOW ARE SYMPTOMS THAT SHOULD BE REPORTED IMMEDIATELY: *FEVER GREATER THAN 100.4 F (38 C) OR HIGHER *CHILLS OR SWEATING *NAUSEA AND VOMITING THAT IS NOT CONTROLLED WITH YOUR NAUSEA MEDICATION *UNUSUAL SHORTNESS OF BREATH *UNUSUAL BRUISING OR BLEEDING *URINARY PROBLEMS (pain or burning when urinating, or frequent urination) *BOWEL PROBLEMS (unusual diarrhea, constipation, pain near the anus) TENDERNESS IN MOUTH AND THROAT WITH OR WITHOUT PRESENCE OF ULCERS (sore throat, sores in mouth, or a toothache) UNUSUAL RASH, SWELLING OR PAIN  UNUSUAL VAGINAL DISCHARGE OR ITCHING   Items with * indicate a potential emergency and should be followed up as soon as possible or go to the Emergency Department if any problems should occur.  Please show the CHEMOTHERAPY ALERT CARD or IMMUNOTHERAPY ALERT CARD at check-in  to the Emergency Department and triage nurse.  Should you have questions after your visit or need to cancel or reschedule your appointment, please contact [facility]  Dept: [phone]  and follow the prompts.  Office hours are 8:00 a.m. to 4:30 p.m. Monday - Friday. Please note that voicemails left after 4:00 p.m. may not be returned until the following business day.  We are closed weekends and major holidays. You have access to a nurse at all times for urgent questions. Please call the main number to the clinic Dept: [phone] and follow the prompts.   For any non-urgent questions, you may also contact your provider using MyChart. We now offer e-Visits for anyone 18 and older to request care online for non-urgent symptoms. For details visit [email or web address].   Also download the MyChart app! Go to the app store, search "MyChart", open the app, select [facility], and log in with your MyChart username and password.   

## 2023-02-22 NOTE — Progress Notes (Signed)
[facility] Cancer Follow up:    Diamond Claude, MD [address] [place] [address]   DIAGNOSIS:  Cancer Staging  Malignant neoplasm of upper-inner quadrant of left breast in female, estrogen receptor positive (HCC) Staging form: Breast, AJCC 8th Edition - Clinical stage from 06/21/2022: Stage IIIB (cT3, cN3, cM0, G3, ER+, PR+, HER2+) - Unsigned Stage prefix: Initial diagnosis Histologic grading system: 3 grade system Stage used in treatment planning: Yes National guidelines used in treatment planning: Yes Type of national guideline used in treatment planning: NCCN   SUMMARY OF ONCOLOGIC HISTORY: Oncology History  Malignant neoplasm of upper-inner quadrant of left breast in female, estrogen receptor positive (HCC)  06/06/2022 Mammogram   Diagnostic mammogram given palpable mass showed suspicious spiculated left breast mass with surrounding nodularity at 9 o'clock position.  This measures up to 3.1 cm sonographically however due to deep location and patient body habitus the mammographic measurement of 5.8 cm is felt to be more accurate.  At least 2 abnormal lymph nodes in the left axilla.  Possible abnormal palpable left supraclavicular lymph node.   06/06/2022 Breast US   Breast ultrasound confirmed these findings.   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  Prognostic showed ER 65% weak to strong staining, PR 15% weak to moderate staining, Ki-67 of 50% and HER2 positive by IHC at 3+    Genetic Testing   [facility] Custom Panel+RNA was Negative. Report date is 06/28/2022.   The Custom Hereditary Cancers Panel offered by [facility] includes sequencing and/or deletion duplication testing of the following 43 genes: APC, ATM, AXIN2, BAP1, BARD1, BMPR1A, BRCA1, BRCA2, BRIP1, CDH1, CDK4, CDKN2A (p14ARF and  p16INK4a only), CHEK2, CTNNA1, EPCAM (Deletion/duplication testing only), FH, GREM1 (promoter region duplication testing only), HOXB13, KIT, MBD4, MEN1, MLH1, MSH2, MSH3, MSH6, MUTYH, NF1, NHTL1, PALB2, PDGFRA, PMS2, POLD1, POLE, PTEN, RAD51C, RAD51D, SMAD4, SMARCA4. STK11, TP53, TSC1, TSC2, and VHL.    06/29/2022 PET scan   IMPRESSION: 1. Hypermetabolic left breast mass, compatible with primary breast malignancy. 2. Hypermetabolic left cervical, axillary, subpectoral, supraclavicular and prevascular mediastinal lymph nodes, compatible with nodal metastatic disease. 3. No evidence of metastatic disease in the abdomen or pelvis. 4. Aortic Atherosclerosis (ICD10-I70.0).     Electronically Signed   By: Allegra Lai M.D.   On: 06/29/2022 09:32   07/11/2022 - 01/16/2023 Chemotherapy   Patient is on Treatment Plan : BREAST DOCEtaxel + Trastuzumab + Pertuzumab (THP) q21d x 8 cycles / Trastuzumab + Pertuzumab q21d x 4 cycles     02/01/2023 -  Chemotherapy   Patient is on Treatment Plan : BREAST METASTATIC Fam-Trastuzumab Deruxtecan-nxki (Enhertu) (5.4) q21d       CURRENT THERAPY:Enhertu  INTERVAL HISTORY: Diamond Marshall 52 y.o. female returns for f/u and evaluation prior to receiving Enhertu therapy.  She is tolerating it moderately well.  She is experiencing diarrhea and is managing it with imodium, lomotil and cholestyramine.  She wants suggestions on how to best optimize her anti diarrheal regimen.  She is having about 5 loose BMs per day.  She denies any nausea, vomiting, or dizziness.  She is drinking about 80 ounces of water plus per day.     Patient Active Problem List   Diagnosis Date Noted   Gastroenteritis 09/29/2022   Port-A-Cath in place 07/07/2022   Genetic testing 06/29/2022  Malignant neoplasm of upper-inner quadrant of left breast in female, estrogen receptor positive (HCC) 06/19/2022   Chronic pain of right knee 03/27/2022   Essential hypertension 03/27/2022   Positive  self-administered antigen test for COVID-19 03/15/2021   Prediabetes 07/22/2019   Anxiety and depression 03/15/2016   Vitamin D deficiency 03/15/2016   Metabolic syndrome X 08/07/2013   Elevated random blood glucose level 08/07/2013   Hypertriglyceridemia 08/07/2013   ADD (attention deficit disorder) 07/24/2013    hyperlipidemia 10/09/2012   Morbid obesity (HCC) 10/09/2012   Hypothyroidism 10/09/2012   B12 deficiency 10/09/2012   Anemia, unspecified 02/12/2011   Dysthymic disorder 02/12/2011   Migraine headache 02/12/2011   Obsessive-compulsive disorder 02/12/2011   Tinnitus 02/12/2011    is allergic to misc. sulfonamide containing compounds, phentermine, bee venom, peanut allergen powder-dnfp, topiramate, septra [sulfamethoxazole-trimethoprim], and sulfa antibiotics.  MEDICAL HISTORY: Past Medical History:  Diagnosis Date   Abdominal hernia    Anemia    b12 deficiency   Anxiety    Arthritis    B12 deficiency    Back pain    Cancer (HCC)    breast cancer   Depression    Family history of adverse reaction to anesthesia    mother vomits after anesthesia   Gallbladder problem    GERD (gastroesophageal reflux disease)    History of hiatal hernia    "small" per patient   Hypertension    Hypothyroidism    Joint pain    Knee pain    Palpitation    "when iron is low"   Pernicious anemia    SOB (shortness of breath)    Thyroid disease    hypothyroidism   Vitamin D deficiency     SURGICAL HISTORY: Past Surgical History:  Procedure Laterality Date   BREAST BIOPSY Left 06/09/2022   [place] LT BREAST BX W LOC DEV 1ST LESION IMG BX SPEC US GUIDE 06/09/2022 [facility] MAMMOGRAPHY   CESAREAN SECTION  1999   Dilatation and currettagement     for miscarriage 18 years ago   PORTACATH PLACEMENT Right 07/05/2022   Procedure: INSERTION PORT-A-CATH;  Surgeon: Harriette Bouillon, MD;  Location: [facility] OR;  Service: General;  Laterality: Right;    SOCIAL HISTORY: Social History    Socioeconomic History   Marital status: Married    Spouse name: Kathlene November   Number of children: Not on file   Years of education: Not on file   Highest education level: Not on file  Occupational History   Occupation: [employer] copy  Tobacco Use   Smoking status: Never   Smokeless tobacco: Never  Vaping Use   Vaping status: Never Used  Substance and Sexual Activity   Alcohol use: No   Drug use: No   Sexual activity: Yes    Birth control/protection: Pill  Other Topics Concern   Not on file  Social History Narrative   Not on file   Social Determinants of Health   Financial Resource Strain: Not on file  Food Insecurity: No Food Insecurity (09/30/2022)   Hunger Vital Sign    Worried About Running Out of Food in the Last Year: Never true    Ran Out of Food in the Last Year: Never true  Transportation Needs: No Transportation Needs (09/30/2022)   PRAPARE - Administrator, Civil Service (Medical): No    Lack of Transportation (Non-Medical): No  Physical Activity: Not on file  Stress: Not on file  Social Connections: Unknown (03/28/2022)   [facility], [facility]   Social Network    Social Network: Not on file  Intimate Partner Violence: Not At Risk (09/30/2022)   Humiliation, Afraid, Rape, and Kick questionnaire    Fear of Current or Ex-Partner: No    Emotionally Abused: No    Physically Abused: No    Sexually Abused: No    FAMILY HISTORY: Family History  Problem Relation Age of Onset   Hypertension Mother    Diabetes Mother    High Cholesterol Mother    Thyroid disease Mother    Depression Mother    Anxiety disorder Mother    Obesity Mother    CVA Father        hemorrhagic   Ovarian cancer Maternal Aunt 74   Colon cancer Maternal Aunt    Breast cancer Cousin 45       maternal first cousin, reports negative genetic testing    Review of Systems  Constitutional:  Negative for appetite change, chills, fatigue, fever and  unexpected weight change.  HENT:   Negative for hearing loss, lump/mass and trouble swallowing.   Eyes:  Negative for eye problems and icterus.  Respiratory:  Negative for chest tightness, cough and shortness of breath.   Cardiovascular:  Negative for chest pain, leg swelling and palpitations.  Gastrointestinal:  Negative for abdominal distention, abdominal pain, constipation, diarrhea, nausea and vomiting.  Endocrine: Negative for hot flashes.  Genitourinary:  Negative for difficulty urinating.   Musculoskeletal:  Negative for arthralgias.  Skin:  Negative for itching and rash.  Neurological:  Negative for dizziness, extremity weakness, headaches and numbness.  Hematological:  Negative for adenopathy. Does not bruise/bleed easily.  Psychiatric/Behavioral:  Negative for depression. The patient is not nervous/anxious.       PHYSICAL EXAMINATION    There were no vitals filed for this visit.  Physical Exam Constitutional:      General: She is not in acute distress.    Appearance: Normal appearance. She is not toxic-appearing.  HENT:     Head: Normocephalic and atraumatic.     Mouth/Throat:     Mouth: Mucous membranes are moist.     Pharynx: Oropharynx is clear. No oropharyngeal exudate or posterior oropharyngeal erythema.  Eyes:     General: No scleral icterus. Cardiovascular:     Rate and Rhythm: Normal rate and regular rhythm.     Pulses: Normal pulses.     Heart sounds: Normal heart sounds.  Pulmonary:     Effort: Pulmonary effort is normal.     Breath sounds: Normal breath sounds.  Abdominal:     General: Abdomen is flat. Bowel sounds are normal. There is no distension.     Palpations: Abdomen is soft.     Tenderness: There is no abdominal tenderness.  Musculoskeletal:        General: No swelling.     Cervical back: Neck supple.  Lymphadenopathy:     Cervical: No cervical adenopathy.  Skin:    General: Skin is warm and dry.     Findings: No rash.  Neurological:      General: No focal deficit present.     Mental Status: She is alert.  Psychiatric:        Mood and Affect: Mood normal.        Behavior: Behavior normal.     LABORATORY DATA:  CBC    Component Value Date/Time   WBC 5.6 02/22/2023 1138   WBC 6.8 02/07/2023 0250   RBC 4.33 02/22/2023 1138  HGB 10.2 (L) 02/22/2023 1138   HGB 12.0 07/21/2021 1511   HCT 32.0 (L) 02/22/2023 1138   HCT 37.6 07/21/2021 1511   PLT 519 (H) 02/22/2023 1138   PLT 359 07/21/2021 1511   MCV 73.9 (L) 02/22/2023 1138   MCV 76 (L) 07/21/2021 1511   MCH 23.6 (L) 02/22/2023 1138   MCHC 31.9 02/22/2023 1138   RDW 20.9 (H) 02/22/2023 1138   RDW 15.7 (H) 07/21/2021 1511   LYMPHSABS 2.1 02/22/2023 1138   LYMPHSABS 1.9 07/21/2021 1511   MONOABS 0.4 02/22/2023 1138   EOSABS 0.2 02/22/2023 1138   EOSABS 0.2 07/21/2021 1511   BASOSABS 0.1 02/22/2023 1138   BASOSABS 0.1 07/21/2021 1511    CMP     Component Value Date/Time   NA 142 02/22/2023 1138   NA 139 03/28/2022 0811   K 3.2 (L) 02/22/2023 1138   CL 107 02/22/2023 1138   CO2 28 02/22/2023 1138   GLUCOSE 124 (H) 02/22/2023 1138   BUN 7 02/22/2023 1138   BUN 14 03/28/2022 0811   CREATININE 0.94 02/22/2023 1138   CREATININE 0.74 10/09/2012 1537   CALCIUM 8.6 (L) 02/22/2023 1138   PROT 6.5 02/22/2023 1138   PROT 7.1 03/28/2022 0811   ALBUMIN 3.4 (L) 02/22/2023 1138   ALBUMIN 4.2 03/28/2022 0811   AST 28 02/22/2023 1138   ALT 24 02/22/2023 1138   ALKPHOS 107 02/22/2023 1138   BILITOT 0.2 (L) 02/22/2023 1138   GFRNONAA >60 02/22/2023 1138   GFRNONAA >89 10/09/2012 1537   GFRAA 96 05/18/2020 1442   GFRAA >89 10/09/2012 1537       ASSESSMENT and THERAPY PLAN:   Malignant neoplasm of upper-inner quadrant of left breast in female, estrogen receptor positive (HCC) Diamond Marshall is a 52 year old woman with stage 4 triple positive breast cancer on treatment with Enhertu.    Stage IV breast cancer: She will continue on Enhertu every 3 weeks.  Considering her diarrhea we discussed the possibility of a dose reduction.  She is motivated to continue at the current dosing.   Diarrhea: We reviewed her anti-diarrheal medications including imodium, lomotil, and cholestyramine.  We also discussed her anti nausea medications with Prochlorperazine and Ondansetron. The Prochlorperazine makes her sleepy, and I recommended that she take 1/2 tablet instead of 1 tablet.    Diamond Marshall will proceed with treatment today.  We reviewed her labs in detail.   All questions were answered. The patient knows to call the clinic with any problems, questions or concerns. We can certainly see the patient much sooner if necessary.  Total encounter time:20 minutes*in face-to-face visit time, chart review, lab review, care coordination, order entry, and documentation of the encounter time.    Diamond Anes, NP 02/24/23 11:23 PM Medical Oncology and Hematology [facility] [address] [place], [place] [address] Tel. [phone]    Fax. [phone]  *Total Encounter Time as defined by the Centers for Medicare and Medicaid Services includes, in addition to the face-to-face time of a patient visit (documented in the note above) non-face-to-face time: obtaining and reviewing outside history, ordering and reviewing medications, tests or procedures, care coordination (communications with other health care professionals or caregivers) and documentation in the medical record.

## 2023-02-23 ENCOUNTER — Other Ambulatory Visit: Payer: Self-pay | Admitting: Family Medicine

## 2023-02-23 DIAGNOSIS — Z1212 Encounter for screening for malignant neoplasm of rectum: Secondary | ICD-10-CM

## 2023-02-23 DIAGNOSIS — Z1211 Encounter for screening for malignant neoplasm of colon: Secondary | ICD-10-CM

## 2023-02-24 ENCOUNTER — Encounter: Payer: Self-pay | Admitting: Hematology and Oncology

## 2023-02-24 NOTE — Assessment & Plan Note (Signed)
Diamond Marshall is a 52 year old woman with stage 4 triple positive breast cancer on treatment with Enhertu.    Stage IV breast cancer: She will continue on Enhertu every 3 weeks. Considering her diarrhea we discussed the possibility of a dose reduction.  She is motivated to continue at the current dosing.   Diarrhea: We reviewed her anti-diarrheal medications including imodium, lomotil, and cholestyramine.  We also discussed her anti nausea medications with Prochlorperazine and Ondansetron. The Prochlorperazine makes her sleepy, and I recommended that she take 1/2 tablet instead of 1 tablet.    Diamond Marshall will proceed with treatment today.  We reviewed her labs in detail.

## 2023-02-25 ENCOUNTER — Encounter: Payer: Self-pay | Admitting: Physician Assistant

## 2023-02-26 ENCOUNTER — Other Ambulatory Visit: Payer: Self-pay | Admitting: Hematology and Oncology

## 2023-02-26 DIAGNOSIS — Z170 Estrogen receptor positive status [ER+]: Secondary | ICD-10-CM

## 2023-02-26 MED ORDER — DIPHENOXYLATE-ATROPINE 2.5-0.025 MG PO TABS: ORAL_TABLET | ORAL | 1 refills | Status: DC

## 2023-02-27 ENCOUNTER — Ambulatory Visit: Payer: Commercial Managed Care - PPO

## 2023-02-27 ENCOUNTER — Other Ambulatory Visit: Payer: Commercial Managed Care - PPO

## 2023-02-27 ENCOUNTER — Ambulatory Visit: Payer: Commercial Managed Care - PPO | Admitting: Hematology and Oncology

## 2023-03-06 ENCOUNTER — Other Ambulatory Visit: Payer: Self-pay

## 2023-03-06 ENCOUNTER — Encounter: Payer: Self-pay | Admitting: Hematology and Oncology

## 2023-03-06 MED ORDER — PROMETHAZINE HCL 25 MG PO TABS: 25.0000 mg | ORAL_TABLET | Freq: Four times a day (QID) | ORAL | 0 refills | Status: DC | PRN

## 2023-03-09 ENCOUNTER — Other Ambulatory Visit: Payer: Self-pay | Admitting: Hematology and Oncology

## 2023-03-09 DIAGNOSIS — C50212 Malignant neoplasm of upper-inner quadrant of left female breast: Secondary | ICD-10-CM

## 2023-03-14 MED FILL — Fosaprepitant Dimeglumine For IV Infusion 150 MG (Base Eq): INTRAVENOUS | Qty: 5 | Status: AC

## 2023-03-15 ENCOUNTER — Encounter: Payer: Commercial Managed Care - PPO | Admitting: Dietician

## 2023-03-15 ENCOUNTER — Other Ambulatory Visit: Payer: Self-pay

## 2023-03-15 ENCOUNTER — Inpatient Hospital Stay (HOSPITAL_BASED_OUTPATIENT_CLINIC_OR_DEPARTMENT_OTHER): Payer: Commercial Managed Care - PPO | Admitting: Hematology and Oncology

## 2023-03-15 ENCOUNTER — Inpatient Hospital Stay: Payer: Commercial Managed Care - PPO

## 2023-03-15 VITALS — BP 144/87 | HR 80 | Temp 98.5°F | Resp 16

## 2023-03-15 DIAGNOSIS — C50212 Malignant neoplasm of upper-inner quadrant of left female breast: Secondary | ICD-10-CM

## 2023-03-15 DIAGNOSIS — Z5112 Encounter for antineoplastic immunotherapy: Secondary | ICD-10-CM | POA: Diagnosis not present

## 2023-03-15 DIAGNOSIS — Z170 Estrogen receptor positive status [ER+]: Secondary | ICD-10-CM | POA: Diagnosis not present

## 2023-03-15 DIAGNOSIS — Z95828 Presence of other vascular implants and grafts: Secondary | ICD-10-CM

## 2023-03-15 LAB — CMP (CANCER CENTER ONLY)
ALT: 27 U/L (ref 0–44)
AST: 28 U/L (ref 15–41)
Albumin: 3.3 g/dL — ABNORMAL LOW (ref 3.5–5.0)
Alkaline Phosphatase: 113 U/L (ref 38–126)
Anion gap: 7 (ref 5–15)
BUN: 6 mg/dL (ref 6–20)
CO2: 26 mmol/L (ref 22–32)
Calcium: 8.4 mg/dL — ABNORMAL LOW (ref 8.9–10.3)
Chloride: 109 mmol/L (ref 98–111)
Creatinine: 0.73 mg/dL (ref 0.44–1.00)
GFR, Estimated: >60 mL/min (ref >60)
Glucose, Bld: 89 mg/dL (ref 70–99)
Potassium: 3.5 mmol/L (ref 3.5–5.1)
Sodium: 142 mmol/L (ref 135–145)
Total Bilirubin: 0.3 mg/dL (ref 0.3–1.2)
Total Protein: 6.2 g/dL — ABNORMAL LOW (ref 6.5–8.1)

## 2023-03-15 LAB — CBC WITH DIFFERENTIAL (CANCER CENTER ONLY)
Abs Immature Granulocytes: 0.04 10*3/uL (ref 0.00–0.07)
Basophils Absolute: 0 10*3/uL (ref 0.0–0.1)
Basophils Relative: 1 %
Eosinophils Absolute: 0.2 10*3/uL (ref 0.0–0.5)
Eosinophils Relative: 5 %
HCT: 32.3 % — ABNORMAL LOW (ref 36.0–46.0)
Hemoglobin: 9.9 g/dL — ABNORMAL LOW (ref 12.0–15.0)
Immature Granulocytes: 1 %
Lymphocytes Relative: 39 %
Lymphs Abs: 1.8 10*3/uL (ref 0.7–4.0)
MCH: 23.5 pg — ABNORMAL LOW (ref 26.0–34.0)
MCHC: 30.7 g/dL (ref 30.0–36.0)
MCV: 76.7 fL — ABNORMAL LOW (ref 80.0–100.0)
Monocytes Absolute: 0.4 10*3/uL (ref 0.1–1.0)
Monocytes Relative: 9 %
Neutro Abs: 2 10*3/uL (ref 1.7–7.7)
Neutrophils Relative %: 45 %
Platelet Count: 551 10*3/uL — ABNORMAL HIGH (ref 150–400)
RBC: 4.21 MIL/uL (ref 3.87–5.11)
RDW: 23.9 % — ABNORMAL HIGH (ref 11.5–15.5)
WBC Count: 4.5 10*3/uL (ref 4.0–10.5)
nRBC: 0 % (ref 0.0–0.2)

## 2023-03-15 MED ORDER — FOSAPREPITANT DIMEGLUMINE INJECTION 150 MG
150.0000 mg | Freq: Once | INTRAVENOUS | Status: AC
  Administered 2023-03-15: 150 mg via INTRAVENOUS
  Filled 2023-03-15: qty 150

## 2023-03-15 MED ORDER — SODIUM CHLORIDE 0.9% FLUSH
10.0000 mL | Freq: Once | INTRAVENOUS | Status: AC
Start: 2023-03-15 — End: 2023-03-15
  Administered 2023-03-15: 10 mL

## 2023-03-15 MED ORDER — DEXAMETHASONE SODIUM PHOSPHATE 10 MG/ML IJ SOLN
10.0000 mg | Freq: Once | INTRAMUSCULAR | Status: AC
  Administered 2023-03-15: 10 mg via INTRAVENOUS
  Filled 2023-03-15: qty 1

## 2023-03-15 MED ORDER — PALONOSETRON HCL INJECTION 0.25 MG/5ML
0.2500 mg | Freq: Once | INTRAVENOUS | Status: AC
  Administered 2023-03-15: 0.25 mg via INTRAVENOUS
  Filled 2023-03-15: qty 5

## 2023-03-15 MED ORDER — DIPHENHYDRAMINE HCL 25 MG PO CAPS
50.0000 mg | ORAL_CAPSULE | Freq: Once | ORAL | Status: AC
  Administered 2023-03-15: 50 mg via ORAL
  Filled 2023-03-15: qty 2

## 2023-03-15 MED ORDER — DEXTROSE 5 % IV SOLN
5.4000 mg/kg | Freq: Once | INTRAVENOUS | Status: AC
  Administered 2023-03-15: 800 mg via INTRAVENOUS
  Filled 2023-03-15: qty 40

## 2023-03-15 MED ORDER — SODIUM CHLORIDE 0.9% FLUSH
10.0000 mL | INTRAVENOUS | Status: DC | PRN
  Administered 2023-03-15: 10 mL

## 2023-03-15 MED ORDER — ACETAMINOPHEN 325 MG PO TABS
650.0000 mg | ORAL_TABLET | Freq: Once | ORAL | Status: AC
  Administered 2023-03-15: 650 mg via ORAL
  Filled 2023-03-15: qty 2

## 2023-03-15 MED ORDER — DEXTROSE 5 % IV SOLN: Freq: Once | INTRAVENOUS | Status: AC

## 2023-03-15 MED ORDER — HEPARIN SOD (PORK) LOCK FLUSH 100 UNIT/ML IV SOLN
500.0000 [IU] | Freq: Once | INTRAVENOUS | Status: AC | PRN
  Administered 2023-03-15: 500 [IU]

## 2023-03-15 NOTE — Progress Notes (Signed)
[facility] Center Cancer Follow up:    Diamond Claude, MD [address] [place] [address]   DIAGNOSIS:  Cancer Staging  Malignant neoplasm of upper-inner quadrant of left breast in female, estrogen receptor positive (HCC) Staging form: Breast, AJCC 8th Edition - Clinical stage from 06/21/2022: Stage IIIB (cT3, cN3, cM0, G3, ER+, PR+, HER2+) - Unsigned Stage prefix: Initial diagnosis Histologic grading system: 3 grade system Stage used in treatment planning: Yes National guidelines used in treatment planning: Yes Type of national guideline used in treatment planning: NCCN   SUMMARY OF ONCOLOGIC HISTORY: Oncology History  Malignant neoplasm of upper-inner quadrant of left breast in female, estrogen receptor positive (HCC)  06/06/2022 Mammogram   Diagnostic mammogram given palpable mass showed suspicious spiculated left breast mass with surrounding nodularity at 9 o'clock position.  This measures up to 3.1 cm sonographically however due to deep location and patient body habitus the mammographic measurement of 5.8 cm is felt to be more accurate.  At least 2 abnormal lymph nodes in the left axilla.  Possible abnormal palpable left supraclavicular lymph node.   06/06/2022 Breast US   Breast ultrasound confirmed these findings.   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  Prognostic showed ER 65% weak to strong staining, PR 15% weak to moderate staining, Ki-67 of 50% and HER2 positive by IHC at 3+    Genetic Testing   [facility] Custom Panel+RNA was Negative. Report date is 06/28/2022.   The Custom Hereditary Cancers Panel offered by [facility] includes sequencing and/or deletion duplication testing of the following 43 genes: APC, ATM, AXIN2, BAP1, BARD1, BMPR1A, BRCA1, BRCA2, BRIP1, CDH1, CDK4, CDKN2A (p14ARF and  p16INK4a only), CHEK2, CTNNA1, EPCAM (Deletion/duplication testing only), FH, GREM1 (promoter region duplication testing only), HOXB13, KIT, MBD4, MEN1, MLH1, MSH2, MSH3, MSH6, MUTYH, NF1, NHTL1, PALB2, PDGFRA, PMS2, POLD1, POLE, PTEN, RAD51C, RAD51D, SMAD4, SMARCA4. STK11, TP53, TSC1, TSC2, and VHL.    06/29/2022 PET scan   IMPRESSION: 1. Hypermetabolic left breast mass, compatible with primary breast malignancy. 2. Hypermetabolic left cervical, axillary, subpectoral, supraclavicular and prevascular mediastinal lymph nodes, compatible with nodal metastatic disease. 3. No evidence of metastatic disease in the abdomen or pelvis. 4. Aortic Atherosclerosis (ICD10-I70.0).     Electronically Signed   By: Allegra Lai M.D.   On: 06/29/2022 09:32   07/11/2022 - 01/16/2023 Chemotherapy   Patient is on Treatment Plan : BREAST DOCEtaxel + Trastuzumab + Pertuzumab (THP) q21d x 8 cycles / Trastuzumab + Pertuzumab q21d x 4 cycles     02/01/2023 -  Chemotherapy   Patient is on Treatment Plan : BREAST METASTATIC Fam-Trastuzumab Deruxtecan-nxki (Enhertu) (5.4) q21d       CURRENT THERAPY: herceptin and perjeta  INTERVAL HISTORY:  Diamond Marshall 52 y.o. female returns for follow-up. She is now on second line enhertu. She is sttill struggling with diarrhea, taking imodium/lomotil and cholestyramine AM and PM, has about 4 stools which are semi solid a day. She had some mild to moderate nausea and an episode of vomiting, taking phenergan as needed for PRN. She otherwise also feels that the left breast has gotten almost back to normal.  She denies any other complaints. No cough, chest pain, shortness of breath.  No change in urinary habits.  No new neurological complaints.  She is motivated to continue the same dose  of Enhertu.  Patient Active Problem List   Diagnosis Date Noted   Gastroenteritis 09/29/2022   Port-A-Cath in place 07/07/2022   Genetic testing 06/29/2022   Malignant neoplasm of  upper-inner quadrant of left breast in female, estrogen receptor positive (HCC) 06/19/2022   Chronic pain of right knee 03/27/2022   Essential hypertension 03/27/2022   Positive self-administered antigen test for COVID-19 03/15/2021   Prediabetes 07/22/2019   Anxiety and depression 03/15/2016   Vitamin D deficiency 03/15/2016   Metabolic syndrome X 08/07/2013   Elevated random blood glucose level 08/07/2013   Hypertriglyceridemia 08/07/2013   ADD (attention deficit disorder) 07/24/2013    hyperlipidemia 10/09/2012   Morbid obesity (HCC) 10/09/2012   Hypothyroidism 10/09/2012   B12 deficiency 10/09/2012   Anemia, unspecified 02/12/2011   Dysthymic disorder 02/12/2011   Migraine headache 02/12/2011   Obsessive-compulsive disorder 02/12/2011   Tinnitus 02/12/2011    is allergic to misc. sulfonamide containing compounds, phentermine, bee venom, peanut allergen powder-dnfp, topiramate, septra [sulfamethoxazole-trimethoprim], and sulfa antibiotics.  MEDICAL HISTORY: Past Medical History:  Diagnosis Date   Abdominal hernia    Anemia    b12 deficiency   Anxiety    Arthritis    B12 deficiency    Back pain    Cancer (HCC)    breast cancer   Depression    Family history of adverse reaction to anesthesia    mother vomits after anesthesia   Gallbladder problem    GERD (gastroesophageal reflux disease)    History of hiatal hernia    "small" per patient   Hypertension    Hypothyroidism    Joint pain    Knee pain    Palpitation    "when iron is low"   Pernicious anemia    SOB (shortness of breath)    Thyroid disease    hypothyroidism   Vitamin D deficiency     SURGICAL HISTORY: Past Surgical History:  Procedure Laterality Date   BREAST BIOPSY Left 06/09/2022   [place] LT BREAST BX W LOC DEV 1ST LESION IMG BX SPEC US GUIDE 06/09/2022 [facility]MO[facility]   CESAREAN SECTION  1999   Dilatation and currettagement     for miscarriage 18 years ago   PORTACATH PLACEMENT Right  07/05/2022   Procedure: INSERTION PORT-A-CATH;  Surgeon: Harriette Bouillon, MD;  Location: [facility] OR;  Service: General;  Laterality: Right;    SOCIAL HISTORY: Social History   Socioeconomic History   Marital status: Married    Spouse name: Kathlene November   Number of children: Not on file   Years of education: Not on file   Highest education level: Not on file  Occupational History   Occupation: [employer] copy  Tobacco Use   Smoking status: Never   Smokeless tobacco: Never  Vaping Use   Vaping status: Never Used  Substance and Sexual Activity   Alcohol use: No   Drug use: No   Sexual activity: Yes    Birth control/protection: Pill  Other Topics Concern   Not on file  Social History Narrative   Not on file   Social Determinants of Health   Financial Resource Strain: Not on file  Food Insecurity: No Food Insecurity (09/30/2022)   Hunger Vital Sign    Worried About Running Out of Food in the Last Year: Never true    Ran Out of Food in the Last Year: Never true  Transportation Needs: No Transportation Needs (09/30/2022)   PRAPARE - Administrator, Civil Service (Medical): No  Lack of Transportation (Non-Medical): No  Physical Activity: Not on file  Stress: Not on file  Social Connections: Unknown (03/28/2022)   Received from [facility], [facility]   Social Network    Social Network: Not on file  Intimate Partner Violence: Not At Risk (09/30/2022)   Humiliation, Afraid, Rape, and Kick questionnaire    Fear of Current or Ex-Partner: No    Emotionally Abused: No    Physically Abused: No    Sexually Abused: No    FAMILY HISTORY: Family History  Problem Relation Age of Onset   Hypertension Mother    Diabetes Mother    High Cholesterol Mother    Thyroid disease Mother    Depression Mother    Anxiety disorder Mother    Obesity Mother    CVA Father        hemorrhagic   Ovarian cancer Maternal Aunt 2   Colon cancer Maternal Aunt    Breast cancer  Cousin 41       maternal first cousin, reports negative genetic testing    Review of Systems  Constitutional:  Positive for fatigue. Negative for appetite change, chills, fever and unexpected weight change.  HENT:   Negative for hearing loss, lump/mass and trouble swallowing.   Eyes:  Negative for eye problems and icterus.  Respiratory:  Negative for chest tightness, cough and shortness of breath.   Cardiovascular:  Negative for chest pain, leg swelling and palpitations.  Gastrointestinal:  Negative for abdominal distention, abdominal pain, constipation, diarrhea, nausea and vomiting.  Endocrine: Negative for hot flashes.  Genitourinary:  Negative for difficulty urinating.   Musculoskeletal:  Negative for arthralgias.  Skin:  Negative for itching and rash.  Neurological:  Negative for dizziness, extremity weakness, headaches and numbness.  Hematological:  Negative for adenopathy. Does not bruise/bleed easily.  Psychiatric/Behavioral:  Negative for depression. The patient is not nervous/anxious.       PHYSICAL EXAMINATION     Vitals:   03/15/23 0915  BP: (!) 143/47  Pulse: 96  Resp: 16  Temp: (!) 97.2 F (36.2 C)  SpO2: 96%     Physical Exam Constitutional:      General: She is not in acute distress.    Appearance: Normal appearance. She is not toxic-appearing.  HENT:     Head: Normocephalic and atraumatic.     Mouth/Throat:     Mouth: Mucous membranes are moist.     Pharynx: Oropharynx is clear. No oropharyngeal exudate or posterior oropharyngeal erythema.  Eyes:     General: No scleral icterus. Cardiovascular:     Rate and Rhythm: Normal rate and regular rhythm.     Pulses: Normal pulses.     Heart sounds: Normal heart sounds.  Pulmonary:     Effort: Pulmonary effort is normal.     Breath sounds: Normal breath sounds.  Chest:     Comments: Left breast skin changes with significant improvement, thickening of the skin is now very minimal and scattered, overall  consistent with excellent clinical response. Abdominal:     General: Abdomen is flat. Bowel sounds are normal. There is no distension.     Palpations: Abdomen is soft.     Tenderness: There is no abdominal tenderness.  Musculoskeletal:        General: No swelling.     Cervical back: Neck supple.  Lymphadenopathy:     Cervical: No cervical adenopathy.  Skin:    General: Skin is warm and dry.     Findings: No  rash.  Neurological:     General: No focal deficit present.     Mental Status: She is alert.  Psychiatric:        Mood and Affect: Mood normal.        Behavior: Behavior normal.     LABORATORY DATA:  CBC    Component Value Date/Time   WBC 4.5 03/15/2023 0849   WBC 6.8 02/07/2023 0250   RBC 4.21 03/15/2023 0849   HGB 9.9 (L) 03/15/2023 0849   HGB 12.0 07/21/2021 1511   HCT 32.3 (L) 03/15/2023 0849   HCT 37.6 07/21/2021 1511   PLT 551 (H) 03/15/2023 0849   PLT 359 07/21/2021 1511   MCV 76.7 (L) 03/15/2023 0849   MCV 76 (L) 07/21/2021 1511   MCH 23.5 (L) 03/15/2023 0849   MCHC 30.7 03/15/2023 0849   RDW 23.9 (H) 03/15/2023 0849   RDW 15.7 (H) 07/21/2021 1511   LYMPHSABS 1.8 03/15/2023 0849   LYMPHSABS 1.9 07/21/2021 1511   MONOABS 0.4 03/15/2023 0849   EOSABS 0.2 03/15/2023 0849   EOSABS 0.2 07/21/2021 1511   BASOSABS 0.0 03/15/2023 0849   BASOSABS 0.1 07/21/2021 1511    CMP     Component Value Date/Time   NA 142 02/22/2023 1138   NA 139 03/28/2022 0811   K 3.2 (L) 02/22/2023 1138   CL 107 02/22/2023 1138   CO2 28 02/22/2023 1138   GLUCOSE 124 (H) 02/22/2023 1138   BUN 7 02/22/2023 1138   BUN 14 03/28/2022 0811   CREATININE 0.94 02/22/2023 1138   CREATININE 0.74 10/09/2012 1537   CALCIUM 8.6 (L) 02/22/2023 1138   PROT 6.5 02/22/2023 1138   PROT 7.1 03/28/2022 0811   ALBUMIN 3.4 (L) 02/22/2023 1138   ALBUMIN 4.2 03/28/2022 0811   AST 28 02/22/2023 1138   ALT 24 02/22/2023 1138   ALKPHOS 107 02/22/2023 1138   BILITOT 0.2 (L) 02/22/2023 1138    GFRNONAA >60 02/22/2023 1138   GFRNONAA >89 10/09/2012 1537   GFRAA 96 05/18/2020 1442   GFRAA >89 10/09/2012 1537     ASSESSMENT and THERAPY PLAN:   Malignant neoplasm of upper-inner quadrant of left breast in female, estrogen receptor positive (HCC) Diamond Marshall is a 52 year old woman with stage 4 triple positive breast cancer on treatment with Enhertu.    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   Stage IV breast cancer: Given concern for local progression in the left breast with biopsy confirming high-grade residual invasive mammary carcinoma, ER/PR negative HER2 positive, Ki-67 of 60%, we have discussed about switching treatment to Enhertu.  We have discussed about mechanism of action, adverse effects including but not limited to fatigue, nausea, diarrhea, pneumonitis, cytopenias, transaminitis etc. she has now received 2 cycles of Enhertu, tolerating it reasonably well except for grade 1 diarrhea.  She is now on Imodium, Lomotil and cholestyramine for management of diarrhea.  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  She has an appointment with gastroenterology but this is currently scheduled for first week of December.  I have messaged Dr. Meridee Score to see if she can be seen any sooner to assist with the management of diarrhea.  Clinically she has had excellent response with Enhertu so far, left breast skin thickening has nearly resolved.  There are no other findings on physical exam.  I have ordered repeat imaging CT chest, abdomen, pelvis as well as repeat mammogram and ultrasound to assess response.  If the imaging is also satisfactory, we will send her back to Dr. Luisa Hart for consideration of left mastectomy.   2.  Diarrhea secondary to chemotherapy, on  Imodium/Lomotil and cholestyramine 3.  Anemia secondary to chemotherapy, no indication for transfusion.  Return to clinic in 3 weeks with me for follow-up    All questions were answered. The patient knows to call the clinic with any problems, questions or concerns. We can certainly see the patient much sooner if necessary.  Total encounter time:30 minutes*in face-to-face visit time, chart review, lab review, care coordination, order entry, and documentation of the encounter time.  *Total Encounter Time as defined by the Centers for Medicare and Medicaid Services includes, in addition to the face-to-face time of a patient visit (documented in the note above) non-face-to-face time: obtaining and reviewing outside history, ordering and reviewing medications, tests or procedures, care coordination (communications with other health care professionals or caregivers) and documentation in the medical record.

## 2023-03-15 NOTE — Assessment & Plan Note (Signed)
Diamond Marshall is a 52 year old woman with stage 4 triple positive breast cancer on treatment with Enhertu.    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   Stage IV breast cancer: Given concern for local progression in the left breast with biopsy confirming high-grade residual invasive mammary carcinoma, ER/PR negative HER2 positive, Ki-67 of 60%, we have discussed about switching treatment to Enhertu.  We have discussed about mechanism of action, adverse effects including but not limited to fatigue, nausea, diarrhea, pneumonitis, cytopenias, transaminitis etc. she has now received 2 cycles of Enhertu, tolerating it reasonably well except for grade 1 diarrhea.  She is now on Imodium, Lomotil and cholestyramine for management of diarrhea.  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  She has an appointment with gastroenterology but this is currently scheduled for first week of December.  I have messaged Dr. Meridee Score to see if she can be seen any sooner to assist with the management of diarrhea.  Clinically she has had excellent response with Enhertu so far, left breast skin thickening has nearly resolved.  There are no other findings on physical exam.  I have ordered repeat imaging CT chest, abdomen, pelvis as well as repeat mammogram and ultrasound to assess response.  If the imaging is also satisfactory, we will send her back to Dr. Luisa Hart for consideration of left mastectomy.   2.  Diarrhea secondary to chemotherapy, on Imodium/Lomotil and cholestyramine 3.  Anemia secondary to chemotherapy, no indication for transfusion.  Return to clinic in 3 weeks with me for follow-up

## 2023-03-15 NOTE — Patient Instructions (Signed)
[place] [facility]  Discharge Instructions: Thank you for choosing [facility] to provide your oncology and hematology care.   If you have a lab appointment with the Cancer Center, please go directly to the Cancer Center and check in at the registration area.   Wear comfortable clothing and clothing appropriate for easy access to any Portacath or PICC line.   We strive to give you quality time with your provider. You may need to reschedule your appointment if you arrive late (15 or more minutes).  Arriving late affects you and other patients whose appointments are after yours.  Also, if you miss three or more appointments without notifying the office, you may be dismissed from the clinic at the provider's discretion.      For prescription refill requests, have your pharmacy contact our office and allow 72 hours for refills to be completed.    Today you received the following chemotherapy and/or immunotherapy agent: Fam-Trastuzumab (Enhertu)   To help prevent nausea and vomiting after your treatment, we encourage you to take your nausea medication as directed.  BELOW ARE SYMPTOMS THAT SHOULD BE REPORTED IMMEDIATELY: *FEVER GREATER THAN 100.4 F (38 C) OR HIGHER *CHILLS OR SWEATING *NAUSEA AND VOMITING THAT IS NOT CONTROLLED WITH YOUR NAUSEA MEDICATION *UNUSUAL SHORTNESS OF BREATH *UNUSUAL BRUISING OR BLEEDING *URINARY PROBLEMS (pain or burning when urinating, or frequent urination) *BOWEL PROBLEMS (unusual diarrhea, constipation, pain near the anus) TENDERNESS IN MOUTH AND THROAT WITH OR WITHOUT PRESENCE OF ULCERS (sore throat, sores in mouth, or a toothache) UNUSUAL RASH, SWELLING OR PAIN  UNUSUAL VAGINAL DISCHARGE OR ITCHING   Items with * indicate a potential emergency and should be followed up as soon as possible or go to the Emergency Department if any problems should occur.  Please show the CHEMOTHERAPY ALERT CARD or IMMUNOTHERAPY ALERT  CARD at check-in to the Emergency Department and triage nurse.  Should you have questions after your visit or need to cancel or reschedule your appointment, please contact [place] [facility]  Dept: [phone]  and follow the prompts.  Office hours are 8:00 a.m. to 4:30 p.m. Monday - Friday. Please note that voicemails left after 4:00 p.m. may not be returned until the following business day.  We are closed weekends and major holidays. You have access to a nurse at all times for urgent questions. Please call the main number to the clinic Dept: [phone] and follow the prompts.   For any non-urgent questions, you may also contact your provider using MyChart. We now offer e-Visits for anyone 86 and older to request care online for non-urgent symptoms. For details visit [email or web address].   Also download the MyChart app! Go to the app store, search "MyChart", open the app, select [place], and log in with your MyChart username and password. Fam-Trastuzumab Deruxtecan Injection What is this medication? FAM-TRASTUZUMAB DERUXTECAN (fam-tras TOOZ eu mab DER ux TEE kan) treats some types of cancer. It works by blocking a protein that causes cancer cells to grow and multiply. This helps to slow or stop the spread of cancer cells. This medicine may be used for other purposes; ask your health care provider or pharmacist if you have questions. COMMON BRAND NAME(S): ENHERTU What should I tell my care team before I take this medication? They need to know if you have any of these conditions: Heart disease Heart failure Infection, especially a viral infection, such as chickenpox, cold sores, or herpes  Liver disease Lung or breathing disease, such as asthma or COPD An unusual or allergic reaction to fam-trastuzumab deruxtecan, other medications, foods, dyes, or preservatives Pregnant or trying to get pregnant Breast-feeding How should I use this medication? This  medication is injected into a vein. It is given by your care team in a hospital or clinic setting. A special MedGuide will be given to you before each treatment. Be sure to read this information carefully each time. Talk to your care team about the use of this medication in children. Special care may be needed. Overdosage: If you think you have taken too much of this medicine contact a poison control center or emergency room at once. NOTE: This medicine is only for you. Do not share this medicine with others. What if I miss a dose? It is important not to miss your dose. Call your care team if you are unable to keep an appointment. What may interact with this medication? Interactions are not expected. This list may not describe all possible interactions. Give your health care provider a list of all the medicines, herbs, non-prescription drugs, or dietary supplements you use. Also tell them if you smoke, drink alcohol, or use illegal drugs. Some items may interact with your medicine. What should I watch for while using this medication? Visit your care team for regular checks on your progress. Tell your care team if your symptoms do not start to get better or if they get worse. Your condition will be monitored carefully while you are receiving this medication. Do not become pregnant while taking this medication or for 7 months after stopping it. Women should inform their care team if they wish to become pregnant or think they might be pregnant. Men should not father a child while taking this medication and for 4 months after stopping it. There is potential for serious side effects to an unborn child. Talk to your care team for more information. Do not breast-feed an infant while taking this medication or for 7 months after the last dose. This medication has caused decreased sperm counts in some men. This may make it more difficult to father a child. Talk to your care team if you are concerned about your  fertility. This medication may increase your risk to bruise or bleed. Call your care team if you notice any unusual bleeding. Be careful brushing or flossing your teeth or using a toothpick because you may get an infection or bleed more easily. If you have any dental work done, tell your dentist you are receiving this medication. This medication may cause dry eyes and blurred vision. If you wear contact lenses, you may feel some discomfort. Lubricating eye drops may help. See your care team if the problem does not go away or is severe. This medication may increase your risk of getting an infection. Call your care team for advice if you get a fever, chills, sore throat, or other symptoms of a cold or flu. Do not treat yourself. Try to avoid being around people who are sick. Avoid taking medications that contain aspirin, acetaminophen, ibuprofen, naproxen, or ketoprofen unless instructed by your care team. These medications may hide a fever. What side effects may I notice from receiving this medication? Side effects that you should report to your care team as soon as possible: Allergic reactions--skin rash, itching, hives, swelling of the face, lips, tongue, or throat Dry cough, shortness of breath or trouble breathing Infection--fever, chills, cough, sore throat, wounds that don't heal, pain  or trouble when passing urine, general feeling of discomfort or being unwell Heart failure--shortness of breath, swelling of the ankles, feet, or hands, sudden weight gain, unusual weakness or fatigue Unusual bruising or bleeding Side effects that usually do not require medical attention (report these to your care team if they continue or are bothersome): Constipation Diarrhea Hair loss Muscle pain Nausea Vomiting This list may not describe all possible side effects. Call your doctor for medical advice about side effects. You may report side effects to FDA at 1-800-FDA-1088. Where should I keep my  medication? This medication is given in a hospital or clinic. It will not be stored at home. NOTE: This sheet is a summary. It may not cover all possible information. If you have questions about this medicine, talk to your doctor, pharmacist, or health care provider.  2024 Elsevier/Gold Standard (2021-02-22 00:00:00)

## 2023-03-20 ENCOUNTER — Encounter: Payer: Self-pay | Admitting: Hematology and Oncology

## 2023-03-20 ENCOUNTER — Ambulatory Visit: Payer: Commercial Managed Care - PPO

## 2023-03-20 ENCOUNTER — Ambulatory Visit: Payer: Commercial Managed Care - PPO | Admitting: Hematology and Oncology

## 2023-03-20 ENCOUNTER — Other Ambulatory Visit: Payer: Commercial Managed Care - PPO

## 2023-03-21 ENCOUNTER — Ambulatory Visit
Admission: RE | Admit: 2023-03-21 | Discharge: 2023-03-21 | Disposition: A | Discharge status: Home / Self Care | Payer: Commercial Managed Care - PPO | Source: Ambulatory Visit | Attending: Hematology and Oncology | Admitting: Hematology and Oncology

## 2023-03-21 ENCOUNTER — Ambulatory Visit
Admission: RE | Admit: 2023-03-21 | Discharge: 2023-03-21 | Disposition: A | Discharge status: Home / Self Care | Payer: Commercial Managed Care - PPO | Source: Ambulatory Visit | Attending: Hematology and Oncology

## 2023-03-21 DIAGNOSIS — C50212 Malignant neoplasm of upper-inner quadrant of left female breast: Secondary | ICD-10-CM

## 2023-03-26 ENCOUNTER — Encounter: Payer: Self-pay | Admitting: Hematology and Oncology

## 2023-03-26 ENCOUNTER — Encounter: Payer: Self-pay | Admitting: Family Medicine

## 2023-03-27 ENCOUNTER — Other Ambulatory Visit: Payer: Self-pay | Admitting: Family Medicine

## 2023-03-27 MED ORDER — QUETIAPINE FUMARATE 25 MG PO TABS: 25.0000 mg | ORAL_TABLET | Freq: Every day | ORAL | 1 refills | Status: DC

## 2023-03-29 ENCOUNTER — Other Ambulatory Visit: Payer: Self-pay | Admitting: Hematology and Oncology

## 2023-03-29 DIAGNOSIS — Z170 Estrogen receptor positive status [ER+]: Secondary | ICD-10-CM

## 2023-04-04 MED FILL — Fosaprepitant Dimeglumine For IV Infusion 150 MG (Base Eq): INTRAVENOUS | Qty: 5 | Status: AC

## 2023-04-05 ENCOUNTER — Ambulatory Visit: Payer: Commercial Managed Care - PPO | Admitting: Adult Health

## 2023-04-05 ENCOUNTER — Inpatient Hospital Stay: Payer: Commercial Managed Care - PPO

## 2023-04-05 ENCOUNTER — Inpatient Hospital Stay (HOSPITAL_BASED_OUTPATIENT_CLINIC_OR_DEPARTMENT_OTHER): Payer: Commercial Managed Care - PPO | Admitting: Hematology and Oncology

## 2023-04-05 ENCOUNTER — Inpatient Hospital Stay: Payer: Commercial Managed Care - PPO | Attending: Hematology and Oncology

## 2023-04-05 ENCOUNTER — Inpatient Hospital Stay: Payer: Commercial Managed Care - PPO | Admitting: Dietician

## 2023-04-05 VITALS — BP 141/67 | HR 83 | Temp 98.1°F | Resp 16 | Wt 300.5 lb

## 2023-04-05 DIAGNOSIS — Z8041 Family history of malignant neoplasm of ovary: Secondary | ICD-10-CM | POA: Diagnosis not present

## 2023-04-05 DIAGNOSIS — C50212 Malignant neoplasm of upper-inner quadrant of left female breast: Secondary | ICD-10-CM

## 2023-04-05 DIAGNOSIS — Z170 Estrogen receptor positive status [ER+]: Secondary | ICD-10-CM

## 2023-04-05 DIAGNOSIS — Z803 Family history of malignant neoplasm of breast: Secondary | ICD-10-CM | POA: Diagnosis not present

## 2023-04-05 DIAGNOSIS — R197 Diarrhea, unspecified: Secondary | ICD-10-CM | POA: Diagnosis not present

## 2023-04-05 DIAGNOSIS — Z800 Family history of malignant neoplasm of digestive organs: Secondary | ICD-10-CM | POA: Insufficient documentation

## 2023-04-05 DIAGNOSIS — F32A Depression, unspecified: Secondary | ICD-10-CM | POA: Diagnosis not present

## 2023-04-05 DIAGNOSIS — Z95828 Presence of other vascular implants and grafts: Secondary | ICD-10-CM

## 2023-04-05 DIAGNOSIS — Z5112 Encounter for antineoplastic immunotherapy: Secondary | ICD-10-CM | POA: Insufficient documentation

## 2023-04-05 DIAGNOSIS — K580 Irritable bowel syndrome with diarrhea: Secondary | ICD-10-CM

## 2023-04-05 DIAGNOSIS — R5383 Other fatigue: Secondary | ICD-10-CM | POA: Insufficient documentation

## 2023-04-05 LAB — CMP (CANCER CENTER ONLY)
ALT: 24 U/L (ref 0–44)
AST: 33 U/L (ref 15–41)
Albumin: 3.2 g/dL — ABNORMAL LOW (ref 3.5–5.0)
Alkaline Phosphatase: 101 U/L (ref 38–126)
Anion gap: 7 (ref 5–15)
BUN: 6 mg/dL (ref 6–20)
CO2: 29 mmol/L (ref 22–32)
Calcium: 8.5 mg/dL — ABNORMAL LOW (ref 8.9–10.3)
Chloride: 108 mmol/L (ref 98–111)
Creatinine: 0.76 mg/dL (ref 0.44–1.00)
GFR, Estimated: >60 mL/min (ref >60)
Glucose, Bld: 109 mg/dL — ABNORMAL HIGH (ref 70–99)
Potassium: 3.1 mmol/L — ABNORMAL LOW (ref 3.5–5.1)
Sodium: 144 mmol/L (ref 135–145)
Total Bilirubin: 0.4 mg/dL (ref <1.2)
Total Protein: 6.1 g/dL — ABNORMAL LOW (ref 6.5–8.1)

## 2023-04-05 LAB — CBC WITH DIFFERENTIAL (CANCER CENTER ONLY)
Abs Immature Granulocytes: 0.01 10*3/uL (ref 0.00–0.07)
Basophils Absolute: 0.1 10*3/uL (ref 0.0–0.1)
Basophils Relative: 1 %
Eosinophils Absolute: 0.4 10*3/uL (ref 0.0–0.5)
Eosinophils Relative: 8 %
HCT: 30.3 % — ABNORMAL LOW (ref 36.0–46.0)
Hemoglobin: 9.9 g/dL — ABNORMAL LOW (ref 12.0–15.0)
Immature Granulocytes: 0 %
Lymphocytes Relative: 44 %
Lymphs Abs: 2 10*3/uL (ref 0.7–4.0)
MCH: 25.9 pg — ABNORMAL LOW (ref 26.0–34.0)
MCHC: 32.7 g/dL (ref 30.0–36.0)
MCV: 79.3 fL — ABNORMAL LOW (ref 80.0–100.0)
Monocytes Absolute: 0.4 10*3/uL (ref 0.1–1.0)
Monocytes Relative: 9 %
Neutro Abs: 1.7 10*3/uL (ref 1.7–7.7)
Neutrophils Relative %: 38 %
Platelet Count: 481 10*3/uL — ABNORMAL HIGH (ref 150–400)
RBC: 3.82 MIL/uL — ABNORMAL LOW (ref 3.87–5.11)
RDW: 26.5 % — ABNORMAL HIGH (ref 11.5–15.5)
WBC Count: 4.6 10*3/uL (ref 4.0–10.5)
nRBC: 0 % (ref 0.0–0.2)

## 2023-04-05 MED ORDER — SODIUM CHLORIDE 0.9% FLUSH
10.0000 mL | Freq: Once | INTRAVENOUS | Status: AC
  Administered 2023-04-05: 10 mL

## 2023-04-05 MED ORDER — SODIUM CHLORIDE 0.9 % IV SOLN
150.0000 mg | Freq: Once | INTRAVENOUS | Status: AC
  Administered 2023-04-05: 150 mg via INTRAVENOUS
  Filled 2023-04-05: qty 150

## 2023-04-05 MED ORDER — SODIUM CHLORIDE 0.9% FLUSH
10.0000 mL | INTRAVENOUS | Status: DC | PRN
Start: 2023-04-05 — End: 2023-04-05
  Administered 2023-04-05: 10 mL

## 2023-04-05 MED ORDER — FAM-TRASTUZUMAB DERUXTECAN-NXKI CHEMO 100 MG IV SOLR
5.4000 mg/kg | Freq: Once | INTRAVENOUS | Status: AC
  Administered 2023-04-05: 800 mg via INTRAVENOUS
  Filled 2023-04-05: qty 40

## 2023-04-05 MED ORDER — DIPHENHYDRAMINE HCL 25 MG PO CAPS
50.0000 mg | ORAL_CAPSULE | Freq: Once | ORAL | Status: AC
  Administered 2023-04-05: 50 mg via ORAL
  Filled 2023-04-05: qty 2

## 2023-04-05 MED ORDER — DEXTROSE 5 % IV SOLN: Freq: Once | INTRAVENOUS | Status: AC

## 2023-04-05 MED ORDER — DEXAMETHASONE SODIUM PHOSPHATE 10 MG/ML IJ SOLN
10.0000 mg | Freq: Once | INTRAMUSCULAR | Status: AC
  Administered 2023-04-05: 10 mg via INTRAVENOUS
  Filled 2023-04-05: qty 1

## 2023-04-05 MED ORDER — HEPARIN SOD (PORK) LOCK FLUSH 100 UNIT/ML IV SOLN
500.0000 [IU] | Freq: Once | INTRAVENOUS | Status: AC | PRN
  Administered 2023-04-05: 500 [IU]

## 2023-04-05 MED ORDER — ACETAMINOPHEN 325 MG PO TABS
650.0000 mg | ORAL_TABLET | Freq: Once | ORAL | Status: AC
  Administered 2023-04-05: 650 mg via ORAL
  Filled 2023-04-05: qty 2

## 2023-04-05 MED ORDER — PALONOSETRON HCL INJECTION 0.25 MG/5ML
0.2500 mg | Freq: Once | INTRAVENOUS | Status: AC
  Administered 2023-04-05: 0.25 mg via INTRAVENOUS
  Filled 2023-04-05: qty 5

## 2023-04-05 NOTE — Progress Notes (Signed)
[facility] Cancer Follow up:    Diamond Claude, MD [address] [place] [address]   DIAGNOSIS:  Cancer Staging  Malignant neoplasm of upper-inner quadrant of left breast in female, estrogen receptor positive (HCC) Staging form: Breast, AJCC 8th Edition - Clinical stage from 06/21/2022: Stage IIIB (cT3, cN3, cM0, G3, ER+, PR+, HER2+) - Unsigned Stage prefix: Initial diagnosis Histologic grading system: 3 grade system Stage used in treatment planning: Yes National guidelines used in treatment planning: Yes Type of national guideline used in treatment planning: NCCN   SUMMARY OF ONCOLOGIC HISTORY: Oncology History  Malignant neoplasm of upper-inner quadrant of left breast in female, estrogen receptor positive (HCC)  06/06/2022 Mammogram   Diagnostic mammogram given palpable mass showed suspicious spiculated left breast mass with surrounding nodularity at 9 o'clock position.  This measures up to 3.1 cm sonographically however due to deep location and patient body habitus the mammographic measurement of 5.8 cm is felt to be more accurate.  At least 2 abnormal lymph nodes in the left axilla.  Possible abnormal palpable left supraclavicular lymph node.   06/06/2022 Breast US   Breast ultrasound confirmed these findings.   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  Prognostic showed ER 65% weak to strong staining, PR 15% weak to moderate staining, Ki-67 of 50% and HER2 positive by IHC at 3+    Genetic Testing   [facility] Custom Panel+RNA was Negative. Report date is 06/28/2022.   The Custom Hereditary Cancers Panel offered by [facility] includes sequencing and/or deletion duplication testing of the following 43 genes: APC, ATM, AXIN2, BAP1, BARD1, BMPR1A, BRCA1, BRCA2, BRIP1, CDH1, CDK4, CDKN2A (p14ARF and  p16INK4a only), CHEK2, CTNNA1, EPCAM (Deletion/duplication testing only), FH, GREM1 (promoter region duplication testing only), HOXB13, KIT, MBD4, MEN1, MLH1, MSH2, MSH3, MSH6, MUTYH, NF1, NHTL1, PALB2, PDGFRA, PMS2, POLD1, POLE, PTEN, RAD51C, RAD51D, SMAD4, SMARCA4. STK11, TP53, TSC1, TSC2, and VHL.    06/29/2022 PET scan   IMPRESSION: 1. Hypermetabolic left breast mass, compatible with primary breast malignancy. 2. Hypermetabolic left cervical, axillary, subpectoral, supraclavicular and prevascular mediastinal lymph nodes, compatible with nodal metastatic disease. 3. No evidence of metastatic disease in the abdomen or pelvis. 4. Aortic Atherosclerosis (ICD10-I70.0).     Electronically Signed   By: Allegra Lai M.D.   On: 06/29/2022 09:32   07/11/2022 - 01/16/2023 Chemotherapy   Patient is on Treatment Plan : BREAST DOCEtaxel + Trastuzumab + Pertuzumab (THP) q21d x 8 cycles / Trastuzumab + Pertuzumab q21d x 4 cycles     02/01/2023 -  Chemotherapy   Patient is on Treatment Plan : BREAST METASTATIC Fam-Trastuzumab Deruxtecan-nxki (Enhertu) (5.4) q21d       CURRENT THERAPY: herceptin and perjeta  INTERVAL HISTORY:  Diamond Marshall 52 y.o. female returns for follow-up. She is now on second line enhertu.   Discussed the use of AI scribe software for clinical note transcription with the patient, who gave verbal consent to proceed.  History of Present Illness        The patient, with a history of inflammatory breast cancer, is currently undergoing chemotherapy with enhertu treatment. She reports significant improvement in breast size and fit in her bra, indicating a positive response to treatment. However, she is experiencing side effects including hair loss, fatigue, and occasional vomiting, particularly when brushing her teeth due to  an increased gag reflex. She has also been experiencing diarrhea, which is now well-managed with Imodium, Lomotil, cholestyramine, and pancreatic enzymes  taken morning and night. She reports having one bowel movement a day, a significant improvement from her previous condition.  The patient also reports a recent cold that lasted for weeks, starting with typical head cold symptoms and then progressing to chest congestion. She managed the symptoms with Mucinex and cold medicine.  In addition to her cancer treatment, the patient has been prescribed Seroquel by her primary care physician for depression, which she attributes to the stress of her situation. She took it once and experienced a severe backache the next morning, but plans to try it again. Rest of the pertinent 10 point ROS reviewed and negative  Patient Active Problem List   Diagnosis Date Noted   Gastroenteritis 09/29/2022   Port-A-Cath in place 07/07/2022   Genetic testing 06/29/2022   Malignant neoplasm of upper-inner quadrant of left breast in female, estrogen receptor positive (HCC) 06/19/2022   Chronic pain of right knee 03/27/2022   Essential hypertension 03/27/2022   Positive self-administered antigen test for COVID-19 03/15/2021   Prediabetes 07/22/2019   Anxiety and depression 03/15/2016   Vitamin D deficiency 03/15/2016   Metabolic syndrome X 08/07/2013   Elevated random blood glucose level 08/07/2013   Hypertriglyceridemia 08/07/2013   ADD (attention deficit disorder) 07/24/2013    hyperlipidemia 10/09/2012   Morbid obesity (HCC) 10/09/2012   Hypothyroidism 10/09/2012   B12 deficiency 10/09/2012   Anemia, unspecified 02/12/2011   Dysthymic disorder 02/12/2011   Migraine headache 02/12/2011   Obsessive-compulsive disorder 02/12/2011   Tinnitus 02/12/2011    is allergic to misc. sulfonamide containing compounds, phentermine, bee venom, peanut allergen powder-dnfp, topiramate, septra [sulfamethoxazole-trimethoprim], and sulfa antibiotics.  MEDICAL HISTORY: Past Medical History:  Diagnosis Date   Abdominal hernia    Anemia    b12 deficiency   Anxiety     Arthritis    B12 deficiency    Back pain    Cancer (HCC)    breast cancer   Depression    Family history of adverse reaction to anesthesia    mother vomits after anesthesia   Gallbladder problem    GERD (gastroesophageal reflux disease)    History of hiatal hernia    "small" per patient   Hypertension    Hypothyroidism    Joint pain    Knee pain    Palpitation    "when iron is low"   Pernicious anemia    SOB (shortness of breath)    Thyroid disease    hypothyroidism   Vitamin D deficiency     SURGICAL HISTORY: Past Surgical History:  Procedure Laterality Date   BREAST BIOPSY Left 06/09/2022   [place] LT BREAST BX W LOC DEV 1ST LESION IMG BX SPEC US GUIDE 06/09/2022 [facility] MAMMOGRAPHY   CESAREAN SECTION  1999   Dilatation and currettagement     for miscarriage 18 years ago   PORTACATH PLACEMENT Right 07/05/2022   Procedure: INSERTION PORT-A-CATH;  Surgeon: Harriette Bouillon, MD;  Location: [facility] OR;  Service: General;  Laterality: Right;    SOCIAL HISTORY: Social History   Socioeconomic History   Marital status: Married    Spouse name: Kathlene November   Number of children: Not on file   Years of education: Not on file   Highest education level: Not on file  Occupational History   Occupation: [employer] copy  Tobacco Use   Smoking status: Never   Smokeless tobacco:  Never  Vaping Use   Vaping status: Never Used  Substance and Sexual Activity   Alcohol use: No   Drug use: No   Sexual activity: Yes    Birth control/protection: Pill  Other Topics Concern   Not on file  Social History Narrative   Not on file   Social Determinants of Health   Financial Resource Strain: Not on file  Food Insecurity: No Food Insecurity (09/30/2022)   Hunger Vital Sign    Worried About Running Out of Food in the Last Year: Never true    Ran Out of Food in the Last Year: Never true  Transportation Needs: No Transportation Needs (09/30/2022)   PRAPARE - Scientist, research (physical sciences) (Medical): No    Lack of Transportation (Non-Medical): No  Physical Activity: Not on file  Stress: Not on file  Social Connections: Unknown (03/28/2022)   [facility]   Social Network    Social Network: Not on file  Intimate Partner Violence: Not At Risk (09/30/2022)   Humiliation, Afraid, Rape, and Kick questionnaire    Fear of Current or Ex-Partner: No    Emotionally Abused: No    Physically Abused: No    Sexually Abused: No    FAMILY HISTORY: Family History  Problem Relation Age of Onset   Hypertension Mother    Diabetes Mother    High Cholesterol Mother    Thyroid disease Mother    Depression Mother    Anxiety disorder Mother    Obesity Mother    CVA Father        hemorrhagic   Ovarian cancer Maternal Aunt 6   Colon cancer Maternal Aunt    Breast cancer Cousin 10       maternal first cousin, reports negative genetic testing    Review of Systems  Constitutional:  Positive for fatigue. Negative for appetite change, chills, fever and unexpected weight change.  HENT:   Negative for hearing loss, lump/mass and trouble swallowing.   Eyes:  Negative for eye problems and icterus.  Respiratory:  Negative for chest tightness, cough and shortness of breath.   Cardiovascular:  Negative for chest pain, leg swelling and palpitations.  Gastrointestinal:  Positive for diarrhea and nausea. Negative for abdominal distention, abdominal pain, constipation and vomiting.  Endocrine: Negative for hot flashes.  Genitourinary:  Negative for difficulty urinating.   Musculoskeletal:  Negative for arthralgias.  Skin:  Negative for itching and rash.  Neurological:  Negative for dizziness, extremity weakness, headaches and numbness.  Hematological:  Negative for adenopathy. Does not bruise/bleed easily.  Psychiatric/Behavioral:  Negative for depression. The patient is not nervous/anxious.       PHYSICAL EXAMINATION     Vitals:   04/05/23  1048  BP: (!) 141/67  Pulse: 83  Resp: 16  Temp: 98.1 F (36.7 C)  SpO2: 98%     Physical Exam Constitutional:      General: She is not in acute distress.    Appearance: Normal appearance. She is not toxic-appearing.  HENT:     Head: Normocephalic and atraumatic.     Mouth/Throat:     Mouth: Mucous membranes are moist.     Pharynx: Oropharynx is clear. No oropharyngeal exudate or posterior oropharyngeal erythema.  Eyes:     General: No scleral icterus. Cardiovascular:     Rate and Rhythm: Normal rate and regular rhythm.     Pulses: Normal pulses.     Heart sounds: Normal heart  sounds.  Pulmonary:     Effort: Pulmonary effort is normal.     Breath sounds: Normal breath sounds.  Chest:     Comments: Left breast skin changes with significant improvement, thickening of the skin is now very minimal and scattered, overall consistent with excellent clinical response. Abdominal:     General: Abdomen is flat. Bowel sounds are normal. There is no distension.     Palpations: Abdomen is soft.     Tenderness: There is no abdominal tenderness.  Musculoskeletal:        General: No swelling.     Cervical back: Neck supple.  Lymphadenopathy:     Cervical: No cervical adenopathy.  Skin:    General: Skin is warm and dry.     Findings: No rash.  Neurological:     General: No focal deficit present.     Mental Status: She is alert.  Psychiatric:        Mood and Affect: Mood normal.        Behavior: Behavior normal.     LABORATORY DATA:  CBC    Component Value Date/Time   WBC 4.6 04/05/2023 1013   WBC 6.8 02/07/2023 0250   RBC 3.82 (L) 04/05/2023 1013   HGB 9.9 (L) 04/05/2023 1013   HGB 12.0 07/21/2021 1511   HCT 30.3 (L) 04/05/2023 1013   HCT 37.6 07/21/2021 1511   PLT 481 (H) 04/05/2023 1013   PLT 359 07/21/2021 1511   MCV 79.3 (L) 04/05/2023 1013   MCV 76 (L) 07/21/2021 1511   MCH 25.9 (L) 04/05/2023 1013   MCHC 32.7 04/05/2023 1013   RDW 26.5 (H) 04/05/2023 1013    RDW 15.7 (H) 07/21/2021 1511   LYMPHSABS 2.0 04/05/2023 1013   LYMPHSABS 1.9 07/21/2021 1511   MONOABS 0.4 04/05/2023 1013   EOSABS 0.4 04/05/2023 1013   EOSABS 0.2 07/21/2021 1511   BASOSABS 0.1 04/05/2023 1013   BASOSABS 0.1 07/21/2021 1511    CMP     Component Value Date/Time   Diamond Marshall 144 04/05/2023 1013   Diamond Marshall 139 03/28/2022 0811   K 3.1 (L) 04/05/2023 1013   CL 108 04/05/2023 1013   CO2 29 04/05/2023 1013   GLUCOSE 109 (H) 04/05/2023 1013   BUN 6 04/05/2023 1013   BUN 14 03/28/2022 0811   CREATININE 0.76 04/05/2023 1013   CREATININE 0.74 10/09/2012 1537   CALCIUM 8.5 (L) 04/05/2023 1013   PROT 6.1 (L) 04/05/2023 1013   PROT 7.1 03/28/2022 0811   ALBUMIN 3.2 (L) 04/05/2023 1013   ALBUMIN 4.2 03/28/2022 0811   AST 33 04/05/2023 1013   ALT 24 04/05/2023 1013   ALKPHOS 101 04/05/2023 1013   BILITOT 0.4 04/05/2023 1013   GFRNONAA >60 04/05/2023 1013   GFRNONAA >89 10/09/2012 1537   GFRAA 96 05/18/2020 1442   GFRAA >89 10/09/2012 1537     ASSESSMENT and THERAPY PLAN:   Malignant neoplasm of upper-inner quadrant of left breast in female, estrogen receptor positive (HCC) Diamond Marshall is a 52 year old woman with stage 4 triple positive breast cancer on treatment with Enhertu.    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   Stage IV breast cancer: Given concern for local progression in the left breast with biopsy confirming high-grade residual invasive mammary carcinoma, ER/PR negative HER2 positive, Ki-67 of 60%, we have discussed about switching treatment to Enhertu.Marland Kitchen  Inflammatory Breast Cancer Improvement noted on recent mammogram and ultrasound. Patient reports breast is less heavy and fits in bra again. CT scan scheduled for further evaluation. Discussed potential surgical intervention if CT is clear. -Continue  current treatment regimen. -Schedule and complete CT scan. -Consider surgical intervention pending CT results. -Continue HER2 therapy post-surgery, then consider maintenance with other kadcyla  Diarrhea Managed with Imodium, Lomotil, cholestyramine, and pancreatic enzymes twice daily. Patient reports one bowel movement per day. -Continue current management regimen.  Depression Patient reports intermittent depressive symptoms. Primary care physician prescribed Seroquel. -Continue Seroquel as prescribed by primary care physician.  Common Cold Recently experienced a severe cold with symptoms progressing to the chest. Managed with Mucinex and over-the-counter cold medicine. -Continue current management regimen.  General Health Maintenance -Schedule echo every three months. -Continue with scheduled infusion and nutrition consultation.   All questions were answered. The patient knows to call the clinic with any problems, questions or concerns. We can certainly see the patient much sooner if necessary.  Total encounter time:30 minutes*in face-to-face visit time, chart review, lab review, care coordination, order entry, and documentation of the encounter time.  *Total Encounter Time as defined by the Centers for Medicare and Medicaid Services includes, in addition to the face-to-face time of a patient visit (documented in the note above) non-face-to-face time: obtaining and reviewing outside history, ordering and reviewing medications, tests or procedures, care coordination (communications with other health care professionals or caregivers) and documentation in the medical record.

## 2023-04-05 NOTE — Patient Instructions (Signed)
[facility] - A DEPT OF [facility]  Discharge Instructions: Thank you for choosing [facility] to provide your oncology and hematology care.   If you have a lab appointment with the Cancer Center, please go directly to the Cancer Center and check in at the registration area.   Wear comfortable clothing and clothing appropriate for easy access to any Portacath or PICC line.   We strive to give you quality time with your provider. You may need to reschedule your appointment if you arrive late (15 or more minutes).  Arriving late affects you and other patients whose appointments are after yours.  Also, if you miss three or more appointments without notifying the office, you may be dismissed from the clinic at the provider's discretion.      For prescription refill requests, have your pharmacy contact our office and allow 72 hours for refills to be completed.    Today you received the following chemotherapy and/or immunotherapy agent: Fam-Trastuzumab (Enhertu)      To help prevent nausea and vomiting after your treatment, we encourage you to take your nausea medication as directed.  BELOW ARE SYMPTOMS THAT SHOULD BE REPORTED IMMEDIATELY: *FEVER GREATER THAN 100.4 F (38 C) OR HIGHER *CHILLS OR SWEATING *NAUSEA AND VOMITING THAT IS NOT CONTROLLED WITH YOUR NAUSEA MEDICATION *UNUSUAL SHORTNESS OF BREATH *UNUSUAL BRUISING OR BLEEDING *URINARY PROBLEMS (pain or burning when urinating, or frequent urination) *BOWEL PROBLEMS (unusual diarrhea, constipation, pain near the anus) TENDERNESS IN MOUTH AND THROAT WITH OR WITHOUT PRESENCE OF ULCERS (sore throat, sores in mouth, or a toothache) UNUSUAL RASH, SWELLING OR PAIN  UNUSUAL VAGINAL DISCHARGE OR ITCHING   Items with * indicate a potential emergency and should be followed up as soon as possible or go to the Emergency Department if any problems should occur.  Please show the CHEMOTHERAPY ALERT CARD  or IMMUNOTHERAPY ALERT CARD at check-in to the Emergency Department and triage nurse.  Should you have questions after your visit or need to cancel or reschedule your appointment, please contact [facility] - A DEPT OF [facility]  Dept: [phone]  and follow the prompts.  Office hours are 8:00 a.m. to 4:30 p.m. Monday - Friday. Please note that voicemails left after 4:00 p.m. may not be returned until the following business day.  We are closed weekends and major holidays. You have access to a nurse at all times for urgent questions. Please call the main number to the clinic Dept: [phone] and follow the prompts.   For any non-urgent questions, you may also contact your provider using MyChart. We now offer e-Visits for anyone 51 and older to request care online for non-urgent symptoms. For details visit [email or web address].   Also download the MyChart app! Go to the app store, search "MyChart", open the app, select [facility], and log in with your MyChart username and password.  Fam-Trastuzumab Deruxtecan Injection What is this medication? FAM-TRASTUZUMAB DERUXTECAN (fam-tras TOOZ eu mab DER ux TEE kan) treats some types of cancer. It works by blocking a protein that causes cancer cells to grow and multiply. This helps to slow or stop the spread of cancer cells. This medicine may be used for other purposes; ask your health care provider or pharmacist if you have questions. COMMON BRAND NAME(S): ENHERTU What should I tell my care team before I take this medication? They need to know if you have any of these conditions: Heart disease  Heart failure Infection, especially a viral infection, such as chickenpox, cold sores, or herpes Liver disease Lung or breathing disease, such as asthma or COPD An unusual or allergic reaction to fam-trastuzumab deruxtecan, other medications, foods, dyes, or preservatives Pregnant or trying to get  pregnant Breast-feeding How should I use this medication? This medication is injected into a vein. It is given by your care team in a hospital or clinic setting. A special MedGuide will be given to you before each treatment. Be sure to read this information carefully each time. Talk to your care team about the use of this medication in children. Special care may be needed. Overdosage: If you think you have taken too much of this medicine contact a poison control center or emergency room at once. NOTE: This medicine is only for you. Do not share this medicine with others. What if I miss a dose? It is important not to miss your dose. Call your care team if you are unable to keep an appointment. What may interact with this medication? Interactions are not expected. This list may not describe all possible interactions. Give your health care provider a list of all the medicines, herbs, non-prescription drugs, or dietary supplements you use. Also tell them if you smoke, drink alcohol, or use illegal drugs. Some items may interact with your medicine. What should I watch for while using this medication? Visit your care team for regular checks on your progress. Tell your care team if your symptoms do not start to get better or if they get worse. Your condition will be monitored carefully while you are receiving this medication. Do not become pregnant while taking this medication or for 7 months after stopping it. Women should inform their care team if they wish to become pregnant or think they might be pregnant. Men should not father a child while taking this medication and for 4 months after stopping it. There is potential for serious side effects to an unborn child. Talk to your care team for more information. Do not breast-feed an infant while taking this medication or for 7 months after the last dose. This medication has caused decreased sperm counts in some men. This may make it more difficult to  father a child. Talk to your care team if you are concerned about your fertility. This medication may increase your risk to bruise or bleed. Call your care team if you notice any unusual bleeding. Be careful brushing or flossing your teeth or using a toothpick because you may get an infection or bleed more easily. If you have any dental work done, tell your dentist you are receiving this medication. This medication may cause dry eyes and blurred vision. If you wear contact lenses, you may feel some discomfort. Lubricating eye drops may help. See your care team if the problem does not go away or is severe. This medication may increase your risk of getting an infection. Call your care team for advice if you get a fever, chills, sore throat, or other symptoms of a cold or flu. Do not treat yourself. Try to avoid being around people who are sick. Avoid taking medications that contain aspirin, acetaminophen, ibuprofen, naproxen, or ketoprofen unless instructed by your care team. These medications may hide a fever. What side effects may I notice from receiving this medication? Side effects that you should report to your care team as soon as possible: Allergic reactions--skin rash, itching, hives, swelling of the face, lips, tongue, or throat Dry cough, shortness of  breath or trouble breathing Infection--fever, chills, cough, sore throat, wounds that don't heal, pain or trouble when passing urine, general feeling of discomfort or being unwell Heart failure--shortness of breath, swelling of the ankles, feet, or hands, sudden weight gain, unusual weakness or fatigue Unusual bruising or bleeding Side effects that usually do not require medical attention (report these to your care team if they continue or are bothersome): Constipation Diarrhea Hair loss Muscle pain Nausea Vomiting This list may not describe all possible side effects. Call your doctor for medical advice about side effects. You may report  side effects to FDA at 1-800-FDA-1088. Where should I keep my medication? This medication is given in a hospital or clinic. It will not be stored at home. NOTE: This sheet is a summary. It may not cover all possible information. If you have questions about this medicine, talk to your doctor, pharmacist, or health care provider.  2024 Elsevier/Gold Standard (2021-02-22 00:00:00)

## 2023-04-05 NOTE — Progress Notes (Unsigned)
Nutrition Follow-up:  Pt with malignant neoplasm of left breast, estrogen receptor positive. Patient is currently receiving Enhertu q21d.     Medications: ***  Labs: ***  Anthropometrics: Wt 300 lb 8 oz today    NUTRITION DIAGNOSIS: ***      INTERVENTION: ***    MONITORING, EVALUATION, GOAL: wt trends, intake, NIS   NEXT VISIT: To be scheduled in collaboration with upcoming [facility] appointments

## 2023-04-05 NOTE — Assessment & Plan Note (Signed)
[name] is a 52 year old woman with stage 4 triple positive breast cancer on treatment with Enhertu.    Malignant neoplasm of upper-inner quadrant of left breast in female, estrogen receptor positive (HCC) [name] is a 52 year old woman with stage 4 triple positive breast cancer on treatment with Enhertu.   Stage IV breast cancer: Given concern for local progression in the left breast with biopsy confirming high-grade residual invasive mammary carcinoma, ER/PR negative HER2 positive, Ki-67 of 60%, we have discussed about switching treatment to Enhertu..   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 Patient reports breast is less heavy and fits in bra again. CT scan scheduled for further evaluation. Discussed potential surgical intervention if CT is clear. -Continue current treatment regimen. -Schedule and complete CT scan. -Consider surgical intervention pending CT results. -Continue HER2 therapy post-surgery, then consider maintenance with other kadcyla  Diarrhea Managed with Imodium, Lomotil, cholestyramine, and pancreatic enzymes twice daily. Patient reports one bowel movement per day. -Continue current management regimen.  Depression Patient reports intermittent depressive symptoms. Primary care physician prescribed Seroquel. -Continue Seroquel as prescribed by primary care physician.  Common Cold Recently experienced a severe cold with symptoms progressing to the chest. Managed with Mucinex and over-the-counter cold medicine. -Continue current management regimen.  General Health Maintenance -Schedule echo every three months. -Continue with scheduled infusion and nutrition consultation.

## 2023-04-06 ENCOUNTER — Encounter: Payer: Self-pay | Admitting: Hematology and Oncology

## 2023-04-10 ENCOUNTER — Ambulatory Visit (HOSPITAL_COMMUNITY)
Admission: RE | Admit: 2023-04-10 | Discharge: 2023-04-10 | Disposition: A | Discharge status: Home / Self Care | Payer: Commercial Managed Care - PPO | Source: Ambulatory Visit | Attending: Hematology and Oncology | Admitting: Hematology and Oncology

## 2023-04-10 ENCOUNTER — Other Ambulatory Visit: Payer: Commercial Managed Care - PPO

## 2023-04-10 ENCOUNTER — Ambulatory Visit: Payer: Commercial Managed Care - PPO

## 2023-04-10 ENCOUNTER — Ambulatory Visit: Payer: Commercial Managed Care - PPO | Admitting: Adult Health

## 2023-04-10 DIAGNOSIS — C50212 Malignant neoplasm of upper-inner quadrant of left female breast: Secondary | ICD-10-CM | POA: Diagnosis present

## 2023-04-10 DIAGNOSIS — Z170 Estrogen receptor positive status [ER+]: Secondary | ICD-10-CM | POA: Insufficient documentation

## 2023-04-10 MED ORDER — HEPARIN SOD (PORK) LOCK FLUSH 100 UNIT/ML IV SOLN
500.0000 [IU] | Freq: Once | INTRAVENOUS | Status: AC
  Administered 2023-04-10: 500 [IU] via INTRAVENOUS

## 2023-04-10 MED ORDER — HEPARIN SOD (PORK) LOCK FLUSH 100 UNIT/ML IV SOLN
INTRAVENOUS | Status: AC
  Filled 2023-04-10: qty 5

## 2023-04-10 MED ORDER — IOHEXOL 300 MG/ML  SOLN
100.0000 mL | Freq: Once | INTRAMUSCULAR | Status: AC | PRN
Start: 2023-04-10 — End: 2023-04-10
  Administered 2023-04-10: 100 mL via INTRAVENOUS

## 2023-04-12 ENCOUNTER — Encounter: Payer: Self-pay | Admitting: Hematology and Oncology

## 2023-04-13 ENCOUNTER — Ambulatory Visit (HOSPITAL_COMMUNITY)
Admission: RE | Admit: 2023-04-13 | Discharge: 2023-04-13 | Disposition: A | Discharge status: Home / Self Care | Payer: Commercial Managed Care - PPO | Source: Ambulatory Visit | Attending: Hematology and Oncology

## 2023-04-13 ENCOUNTER — Encounter: Payer: Self-pay | Admitting: Hematology and Oncology

## 2023-04-13 DIAGNOSIS — C50212 Malignant neoplasm of upper-inner quadrant of left female breast: Secondary | ICD-10-CM | POA: Insufficient documentation

## 2023-04-13 DIAGNOSIS — Z0189 Encounter for other specified special examinations: Secondary | ICD-10-CM | POA: Diagnosis not present

## 2023-04-13 DIAGNOSIS — Z170 Estrogen receptor positive status [ER+]: Secondary | ICD-10-CM | POA: Insufficient documentation

## 2023-04-13 LAB — ECHOCARDIOGRAM COMPLETE
Area-P 1/2: 3.6 cm2
Calc EF: 56.5 %
S' Lateral: 3.4 cm
Single Plane A2C EF: 59.1 %
Single Plane A4C EF: 52.6 %

## 2023-04-13 NOTE — Progress Notes (Signed)
  Echocardiogram 2D Echocardiogram has been performed.  Leda Roys RDCS 04/13/2023, 10:08 AM

## 2023-04-17 ENCOUNTER — Encounter: Payer: Self-pay | Admitting: Hematology and Oncology

## 2023-04-17 ENCOUNTER — Other Ambulatory Visit: Payer: Self-pay | Admitting: Hematology and Oncology

## 2023-04-17 DIAGNOSIS — C50212 Malignant neoplasm of upper-inner quadrant of left female breast: Secondary | ICD-10-CM

## 2023-04-17 MED ORDER — BUDESONIDE 3 MG PO CPEP: 9.0000 mg | ORAL_CAPSULE | Freq: Every day | ORAL | 1 refills | Status: DC

## 2023-04-18 ENCOUNTER — Encounter: Payer: Self-pay | Admitting: Hematology and Oncology

## 2023-04-18 ENCOUNTER — Other Ambulatory Visit: Payer: Self-pay | Admitting: *Deleted

## 2023-04-23 ENCOUNTER — Encounter: Payer: Self-pay | Admitting: Hematology and Oncology

## 2023-04-23 ENCOUNTER — Other Ambulatory Visit: Payer: Self-pay | Admitting: Hematology and Oncology

## 2023-04-23 DIAGNOSIS — C50212 Malignant neoplasm of upper-inner quadrant of left female breast: Secondary | ICD-10-CM

## 2023-04-25 ENCOUNTER — Encounter: Payer: Self-pay | Admitting: Hematology and Oncology

## 2023-04-25 ENCOUNTER — Ambulatory Visit: Payer: Commercial Managed Care - PPO | Admitting: Physician Assistant

## 2023-04-26 ENCOUNTER — Inpatient Hospital Stay (HOSPITAL_BASED_OUTPATIENT_CLINIC_OR_DEPARTMENT_OTHER): Payer: Commercial Managed Care - PPO | Admitting: Hematology and Oncology

## 2023-04-26 ENCOUNTER — Inpatient Hospital Stay: Payer: Commercial Managed Care - PPO | Attending: Hematology and Oncology

## 2023-04-26 VITALS — BP 150/83 | HR 95 | Temp 97.9°F | Resp 16 | Wt 295.0 lb

## 2023-04-26 DIAGNOSIS — Z170 Estrogen receptor positive status [ER+]: Secondary | ICD-10-CM | POA: Diagnosis not present

## 2023-04-26 DIAGNOSIS — Z8041 Family history of malignant neoplasm of ovary: Secondary | ICD-10-CM | POA: Insufficient documentation

## 2023-04-26 DIAGNOSIS — Z95828 Presence of other vascular implants and grafts: Secondary | ICD-10-CM

## 2023-04-26 DIAGNOSIS — C50212 Malignant neoplasm of upper-inner quadrant of left female breast: Secondary | ICD-10-CM | POA: Diagnosis not present

## 2023-04-26 DIAGNOSIS — J029 Acute pharyngitis, unspecified: Secondary | ICD-10-CM | POA: Diagnosis not present

## 2023-04-26 DIAGNOSIS — Z800 Family history of malignant neoplasm of digestive organs: Secondary | ICD-10-CM | POA: Diagnosis not present

## 2023-04-26 DIAGNOSIS — Z803 Family history of malignant neoplasm of breast: Secondary | ICD-10-CM | POA: Diagnosis not present

## 2023-04-26 DIAGNOSIS — Z5112 Encounter for antineoplastic immunotherapy: Secondary | ICD-10-CM | POA: Insufficient documentation

## 2023-04-26 LAB — CBC WITH DIFFERENTIAL (CANCER CENTER ONLY)
Abs Immature Granulocytes: 0.06 10*3/uL (ref 0.00–0.07)
Basophils Absolute: 0.1 10*3/uL (ref 0.0–0.1)
Basophils Relative: 1 %
Eosinophils Absolute: 0.3 10*3/uL (ref 0.0–0.5)
Eosinophils Relative: 4 %
HCT: 27.3 % — ABNORMAL LOW (ref 36.0–46.0)
Hemoglobin: 9.1 g/dL — ABNORMAL LOW (ref 12.0–15.0)
Immature Granulocytes: 1 %
Lymphocytes Relative: 26 %
Lymphs Abs: 1.6 10*3/uL (ref 0.7–4.0)
MCH: 27.4 pg (ref 26.0–34.0)
MCHC: 33.3 g/dL (ref 30.0–36.0)
MCV: 82.2 fL (ref 80.0–100.0)
Monocytes Absolute: 0.8 10*3/uL (ref 0.1–1.0)
Monocytes Relative: 13 %
Neutro Abs: 3.6 10*3/uL (ref 1.7–7.7)
Neutrophils Relative %: 55 %
Platelet Count: 454 10*3/uL — ABNORMAL HIGH (ref 150–400)
RBC: 3.32 MIL/uL — ABNORMAL LOW (ref 3.87–5.11)
RDW: 26.3 % — ABNORMAL HIGH (ref 11.5–15.5)
WBC Count: 6.4 10*3/uL (ref 4.0–10.5)
nRBC: 0 % (ref 0.0–0.2)

## 2023-04-26 LAB — CMP (CANCER CENTER ONLY)
ALT: 25 U/L (ref 0–44)
AST: 37 U/L (ref 15–41)
Albumin: 3.3 g/dL — ABNORMAL LOW (ref 3.5–5.0)
Alkaline Phosphatase: 97 U/L (ref 38–126)
Anion gap: 6 (ref 5–15)
BUN: 8 mg/dL (ref 6–20)
CO2: 28 mmol/L (ref 22–32)
Calcium: 8.5 mg/dL — ABNORMAL LOW (ref 8.9–10.3)
Chloride: 108 mmol/L (ref 98–111)
Creatinine: 0.69 mg/dL (ref 0.44–1.00)
GFR, Estimated: >60 mL/min (ref >60)
Glucose, Bld: 111 mg/dL — ABNORMAL HIGH (ref 70–99)
Potassium: 3.1 mmol/L — ABNORMAL LOW (ref 3.5–5.1)
Sodium: 142 mmol/L (ref 135–145)
Total Bilirubin: 0.4 mg/dL (ref <1.2)
Total Protein: 6 g/dL — ABNORMAL LOW (ref 6.5–8.1)

## 2023-04-26 MED ORDER — AZITHROMYCIN 250 MG PO TABS: ORAL_TABLET | ORAL | 0 refills | Status: DC

## 2023-04-26 MED ORDER — HEPARIN SOD (PORK) LOCK FLUSH 100 UNIT/ML IV SOLN
500.0000 [IU] | Freq: Once | INTRAVENOUS | Status: AC
  Administered 2023-04-26: 500 [IU]

## 2023-04-26 MED ORDER — SODIUM CHLORIDE 0.9% FLUSH
10.0000 mL | Freq: Once | INTRAVENOUS | Status: AC
  Administered 2023-04-26: 10 mL

## 2023-04-26 NOTE — Progress Notes (Signed)
[facility] Cancer Follow up:    Mechele Claude, MD [address] [place] [address]   DIAGNOSIS:  Cancer Staging  Malignant neoplasm of upper-inner quadrant of left breast in female, estrogen receptor positive (HCC) Staging form: Breast, AJCC 8th Edition - Clinical stage from 06/21/2022: Stage IIIB (cT3, cN3, cM0, G3, ER+, PR+, HER2+) - Unsigned Stage prefix: Initial diagnosis Histologic grading system: 3 grade system Stage used in treatment planning: Yes National guidelines used in treatment planning: Yes Type of national guideline used in treatment planning: NCCN   SUMMARY OF ONCOLOGIC HISTORY: Oncology History  Malignant neoplasm of upper-inner quadrant of left breast in female, estrogen receptor positive (HCC)  06/06/2022 Mammogram   Diagnostic mammogram given palpable mass showed suspicious spiculated left breast mass with surrounding nodularity at 9 o'clock position.  This measures up to 3.1 cm sonographically however due to deep location and patient body habitus the mammographic measurement of 5.8 cm is felt to be more accurate.  At least 2 abnormal lymph nodes in the left axilla.  Possible abnormal palpable left supraclavicular lymph node.   06/06/2022 Breast US   Breast ultrasound confirmed these findings.   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  Prognostic showed ER 65% weak to strong staining, PR 15% weak to moderate staining, Ki-67 of 50% and HER2 positive by IHC at 3+    Genetic Testing   [facility] Custom Panel+RNA was Negative. Report date is 06/28/2022.   The Custom Hereditary Cancers Panel offered by [facility] includes sequencing and/or deletion duplication testing of the following 43 genes: APC, ATM, AXIN2, BAP1, BARD1, BMPR1A, BRCA1, BRCA2, BRIP1, CDH1, CDK4, CDKN2A (p14ARF and  p16INK4a only), CHEK2, CTNNA1, EPCAM (Deletion/duplication testing only), FH, GREM1 (promoter region duplication testing only), HOXB13, KIT, MBD4, MEN1, MLH1, MSH2, MSH3, MSH6, MUTYH, NF1, NHTL1, PALB2, PDGFRA, PMS2, POLD1, POLE, PTEN, RAD51C, RAD51D, SMAD4, SMARCA4. STK11, TP53, TSC1, TSC2, and VHL.    06/29/2022 PET scan   IMPRESSION: 1. Hypermetabolic left breast mass, compatible with primary breast malignancy. 2. Hypermetabolic left cervical, axillary, subpectoral, supraclavicular and prevascular mediastinal lymph nodes, compatible with nodal metastatic disease. 3. No evidence of metastatic disease in the abdomen or pelvis. 4. Aortic Atherosclerosis (ICD10-I70.0).     Electronically Signed   By: Allegra Lai M.D.   On: 06/29/2022 09:32   07/11/2022 - 01/16/2023 Chemotherapy   Patient is on Treatment Plan : BREAST DOCEtaxel + Trastuzumab + Pertuzumab (THP) q21d x 8 cycles / Trastuzumab + Pertuzumab q21d x 4 cycles     02/01/2023 -  Chemotherapy   Patient is on Treatment Plan : BREAST METASTATIC Fam-Trastuzumab Deruxtecan-nxki (Enhertu) (5.4) q21d       CURRENT THERAPY: herceptin and perjeta  INTERVAL HISTORY:  Diamond Marshall 52 y.o. female returns for follow-up. She is now on second line enhertu.   Discussed the use of AI scribe software for clinical note transcription with the patient, who gave verbal consent to proceed.  History of Present Illness    The patient, with a history of inflammatory breast cancer, is currently undergoing chemotherapy with enhertu treatment.  The patient presents with a 'horrible sore throat' and the sensation of 'swallowing rocks,' which started on Monday. She denies fever, with the highest temperature being 99.4 degrees. She is concerned about the possibility of strep throat.  In addition, the  patient reports significant improvement in her stomach issues since starting Budesonide. She describes feeling 'really good,' being able to eat without  experiencing diarrhea, and not needing to take Lomotil or Imodium. She has been able to eat three meals a day, albeit in smaller portions due to significant weight loss.  The patient also mentions a burning sensation all over her body when transitioning from sitting or lying to standing. The sensation, which feels like a sunburn, lasts for about 30 seconds before subsiding.  The patient is also dealing with a cancer diagnosis. She reports that a recent CT scan showed no definitive metastatic disease, although there was mention of a two millimeter lung nodule and stable breast thickening.   Rest of the pertinent 10 point ROS reviewed and negative  Patient Active Problem List   Diagnosis Date Noted   Gastroenteritis 09/29/2022   Port-A-Cath in place 07/07/2022   Genetic testing 06/29/2022   Malignant neoplasm of upper-inner quadrant of left breast in female, estrogen receptor positive (HCC) 06/19/2022   Chronic pain of right knee 03/27/2022   Essential hypertension 03/27/2022   Positive self-administered antigen test for COVID-19 03/15/2021   Prediabetes 07/22/2019   Anxiety and depression 03/15/2016   Vitamin D deficiency 03/15/2016   Metabolic syndrome X 08/07/2013   Elevated random blood glucose level 08/07/2013   Hypertriglyceridemia 08/07/2013   ADD (attention deficit disorder) 07/24/2013    hyperlipidemia 10/09/2012   Morbid obesity (HCC) 10/09/2012   Hypothyroidism 10/09/2012   B12 deficiency 10/09/2012   Anemia, unspecified 02/12/2011   Dysthymic disorder 02/12/2011   Migraine headache 02/12/2011   Obsessive-compulsive disorder 02/12/2011   Tinnitus 02/12/2011    is allergic to misc. sulfonamide containing compounds, phentermine, bee venom, peanut allergen powder-dnfp, topiramate, septra [sulfamethoxazole-trimethoprim], and sulfa antibiotics.  MEDICAL HISTORY: Past Medical History:  Diagnosis Date   Abdominal hernia    Anemia    b12 deficiency   Anxiety    Arthritis     B12 deficiency    Back pain    Cancer (HCC)    breast cancer   Depression    Family history of adverse reaction to anesthesia    mother vomits after anesthesia   Gallbladder problem    GERD (gastroesophageal reflux disease)    History of hiatal hernia    "small" per patient   Hypertension    Hypothyroidism    Joint pain    Knee pain    Palpitation    "when iron is low"   Pernicious anemia    SOB (shortness of breath)    Thyroid disease    hypothyroidism   Vitamin D deficiency     SURGICAL HISTORY: Past Surgical History:  Procedure Laterality Date   BREAST BIOPSY Left 06/09/2022   [place] LT BREAST BX W LOC DEV 1ST LESION IMG BX SPEC US GUIDE 06/09/2022 [facility] MAMMOGRAPHY   CESAREAN SECTION  1999   Dilatation and currettagement     for miscarriage 18 years ago   PORTACATH PLACEMENT Right 07/05/2022   Procedure: INSERTION PORT-A-CATH;  Surgeon: Harriette Bouillon, MD;  Location: [facility] OR;  Service: General;  Laterality: Right;    SOCIAL HISTORY: Social History   Socioeconomic History   Marital status: Married    Spouse name: Kathlene November   Number of children: Not on file   Years of education: Not on file   Highest education level: Not on file  Occupational History   Occupation: [employer] copy  Tobacco Use   Smoking status: Never  Smokeless tobacco: Never  Vaping Use   Vaping status: Never Used  Substance and Sexual Activity   Alcohol use: No   Drug use: No   Sexual activity: Yes    Birth control/protection: Pill  Other Topics Concern   Not on file  Social History Narrative   Not on file   Social Determinants of Health   Financial Resource Strain: Not on file  Food Insecurity: No Food Insecurity (09/30/2022)   Hunger Vital Sign    Worried About Running Out of Food in the Last Year: Never true    Ran Out of Food in the Last Year: Never true  Transportation Needs: No Transportation Needs (09/30/2022)   PRAPARE - Administrator, Civil Service  (Medical): No    Lack of Transportation (Non-Medical): No  Physical Activity: Not on file  Stress: Not on file  Social Connections: Unknown (03/28/2022)   [facility]   Social Network    Social Network: Not on file  Intimate Partner Violence: Not At Risk (09/30/2022)   Humiliation, Afraid, Rape, and Kick questionnaire    Fear of Current or Ex-Partner: No    Emotionally Abused: No    Physically Abused: No    Sexually Abused: No    FAMILY HISTORY: Family History  Problem Relation Age of Onset   Hypertension Mother    Diabetes Mother    High Cholesterol Mother    Thyroid disease Mother    Depression Mother    Anxiety disorder Mother    Obesity Mother    CVA Father        hemorrhagic   Ovarian cancer Maternal Aunt 51   Colon cancer Maternal Aunt    Breast cancer Cousin 35       maternal first cousin, reports negative genetic testing    PHYSICAL EXAMINATION   Vitals:   04/26/23 1229  BP: (!) 150/83  Pulse: 95  Resp: 16  Temp: 97.9 F (36.6 C)  SpO2: 99%     Physical Exam Constitutional:      General: She is not in acute distress.    Appearance: Normal appearance. She is not toxic-appearing.  HENT:     Head: Normocephalic and atraumatic.     Mouth/Throat:     Mouth: Mucous membranes are moist.     Pharynx: Oropharynx is clear. No oropharyngeal exudate or posterior oropharyngeal erythema.  Eyes:     General: No scleral icterus. Cardiovascular:     Rate and Rhythm: Normal rate and regular rhythm.     Pulses: Normal pulses.     Heart sounds: Normal heart sounds.  Pulmonary:     Effort: Pulmonary effort is normal.     Breath sounds: Normal breath sounds.  Chest:       Comments: Left breast skin changes with significant improvement, thickening of the skin is now only periareolar overall consistent with excellent clinical response. Abdominal:     General: Abdomen is flat. Bowel sounds are normal. There is no distension.      Palpations: Abdomen is soft.     Tenderness: There is no abdominal tenderness.  Musculoskeletal:        General: No swelling.     Cervical back: Neck supple.  Lymphadenopathy:     Cervical: No cervical adenopathy.  Skin:    General: Skin is warm and dry.     Findings: No rash.  Neurological:     General: No focal deficit present.  Mental Status: She is alert.  Psychiatric:        Mood and Affect: Mood normal.        Behavior: Behavior normal.     LABORATORY DATA:  CBC    Component Value Date/Time   WBC 6.4 04/26/2023 1156   WBC 6.8 02/07/2023 0250   RBC 3.32 (L) 04/26/2023 1156   HGB 9.1 (L) 04/26/2023 1156   HGB 12.0 07/21/2021 1511   HCT 27.3 (L) 04/26/2023 1156   HCT 37.6 07/21/2021 1511   PLT 454 (H) 04/26/2023 1156   PLT 359 07/21/2021 1511   MCV 82.2 04/26/2023 1156   MCV 76 (L) 07/21/2021 1511   MCH 27.4 04/26/2023 1156   MCHC 33.3 04/26/2023 1156   RDW 26.3 (H) 04/26/2023 1156   RDW 15.7 (H) 07/21/2021 1511   LYMPHSABS 1.6 04/26/2023 1156   LYMPHSABS 1.9 07/21/2021 1511   MONOABS 0.8 04/26/2023 1156   EOSABS 0.3 04/26/2023 1156   EOSABS 0.2 07/21/2021 1511   BASOSABS 0.1 04/26/2023 1156   BASOSABS 0.1 07/21/2021 1511    CMP     Component Value Date/Time   NA 142 04/26/2023 1156   NA 139 03/28/2022 0811   K 3.1 (L) 04/26/2023 1156   CL 108 04/26/2023 1156   CO2 28 04/26/2023 1156   GLUCOSE 111 (H) 04/26/2023 1156   BUN 8 04/26/2023 1156   BUN 14 03/28/2022 0811   CREATININE 0.69 04/26/2023 1156   CREATININE 0.74 10/09/2012 1537   CALCIUM 8.5 (L) 04/26/2023 1156   PROT 6.0 (L) 04/26/2023 1156   PROT 7.1 03/28/2022 0811   ALBUMIN 3.3 (L) 04/26/2023 1156   ALBUMIN 4.2 03/28/2022 0811   AST 37 04/26/2023 1156   ALT 25 04/26/2023 1156   ALKPHOS 97 04/26/2023 1156   BILITOT 0.4 04/26/2023 1156   GFRNONAA >60 04/26/2023 1156   GFRNONAA >89 10/09/2012 1537   GFRAA 96 05/18/2020 1442   GFRAA >89 10/09/2012 1537     ASSESSMENT and THERAPY  PLAN:   Malignant neoplasm of upper-inner quadrant of left breast in female, estrogen receptor positive (HCC)  Malignant neoplasm of upper-inner quadrant of left breast in female, estrogen receptor positive (HCC) Jerilyn is a 52 year old woman with stage 4 triple positive breast cancer on treatment with Enhertu.    Breast Cancer Stable skin thickening on CT scan, lymph nodes improved. No definitive metastatic disease, but a 2mm lung nodule noted.  -Refer back to surgeon for re-evaluation and possible mastectomy on left lymph node removal. -Present case at tumor board for further discussion. -Plan for maintenance Kadcyla post-surgery, if surgery is feasible.  Pharyngitis Severe sore throat since Monday, no fever. Tonsils slightly enlarged on examination. Possible strep throat. -Prescribe Z-Pak (Azithromycin) to be taken as directed:two pills on the first day, then one pill daily until finished.  Gastrointestinal issues Significant improvement with Budesonide, no diarrhea, able to eat meals without needing Lomotil or Imodium. -Continue Budesonide 3mg  daily. -Refill available if needed.  General Health Maintenance -Next chemotherapy treatment scheduled for tomorrow in [facility]. -Follow-up after treatment.   EF drop noted, will refer to cardiology, ok to continue Enhertu.  All questions were answered. The patient knows to call the clinic with any problems, questions or concerns. We can certainly see the patient much sooner if necessary.  Total encounter time:30 minutes*in face-to-face visit time, chart review, lab review, care coordination, order entry, and documentation of the encounter time.  *Total Encounter Time as defined by the Centers for Medicare  and Medicaid Services includes, in addition to the face-to-face time of a patient visit (documented in the note above) non-face-to-face time: obtaining and reviewing outside history, ordering and reviewing medications, tests or  procedures, care coordination (communications with other health care professionals or caregivers) and documentation in the medical record.

## 2023-04-26 NOTE — Assessment & Plan Note (Signed)
  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    Breast Cancer Stable skin thickening on CT scan, lymph nodes improved. No definitive metastatic disease, but a 2mm lung nodule noted.  -Refer back to surgeon for re-evaluation and possible mastectomy on left lymph node removal. -Present case at tumor board for further discussion. -Plan for maintenance Kadcyla post-surgery, if surgery is feasible.  Pharyngitis Severe sore throat since Monday, no fever. Tonsils slightly enlarged on examination. Possible strep throat. -Prescribe Z-Pak (Azithromycin) to be taken as directed:two pills on the first day, then one pill daily until finished.  Gastrointestinal issues Significant improvement with Budesonide, no diarrhea, able to eat meals without needing Lomotil or Imodium. -Continue Budesonide 3mg  daily. -Refill available if needed.  General Health Maintenance -Next chemotherapy treatment scheduled for tomorrow in [facility]. -Follow-up after treatment.

## 2023-04-27 ENCOUNTER — Inpatient Hospital Stay: Payer: Commercial Managed Care - PPO

## 2023-04-27 ENCOUNTER — Ambulatory Visit: Payer: Commercial Managed Care - PPO | Admitting: Physician Assistant

## 2023-04-27 VITALS — BP 157/86 | HR 78 | Temp 98.0°F | Resp 16

## 2023-04-27 DIAGNOSIS — C50212 Malignant neoplasm of upper-inner quadrant of left female breast: Secondary | ICD-10-CM

## 2023-04-27 DIAGNOSIS — Z5112 Encounter for antineoplastic immunotherapy: Secondary | ICD-10-CM | POA: Diagnosis not present

## 2023-04-27 MED ORDER — PALONOSETRON HCL INJECTION 0.25 MG/5ML
0.2500 mg | Freq: Once | INTRAVENOUS | Status: AC
  Administered 2023-04-27: 0.25 mg via INTRAVENOUS
  Filled 2023-04-27: qty 5

## 2023-04-27 MED ORDER — SODIUM CHLORIDE 0.9% FLUSH
10.0000 mL | INTRAVENOUS | Status: DC | PRN
Start: 2023-04-27 — End: 2023-04-27
  Administered 2023-04-27: 10 mL

## 2023-04-27 MED ORDER — DEXAMETHASONE SODIUM PHOSPHATE 10 MG/ML IJ SOLN
10.0000 mg | Freq: Once | INTRAMUSCULAR | Status: AC
  Administered 2023-04-27: 10 mg via INTRAVENOUS
  Filled 2023-04-27: qty 1

## 2023-04-27 MED ORDER — ACETAMINOPHEN 325 MG PO TABS
650.0000 mg | ORAL_TABLET | Freq: Once | ORAL | Status: AC
  Administered 2023-04-27: 650 mg via ORAL
  Filled 2023-04-27: qty 2

## 2023-04-27 MED ORDER — DIPHENHYDRAMINE HCL 25 MG PO CAPS
50.0000 mg | ORAL_CAPSULE | Freq: Once | ORAL | Status: AC
  Administered 2023-04-27: 50 mg via ORAL
  Filled 2023-04-27: qty 2

## 2023-04-27 MED ORDER — FAM-TRASTUZUMAB DERUXTECAN-NXKI CHEMO 100 MG IV SOLR
5.4000 mg/kg | Freq: Once | INTRAVENOUS | Status: AC
  Administered 2023-04-27: 800 mg via INTRAVENOUS
  Filled 2023-04-27: qty 40

## 2023-04-27 MED ORDER — HEPARIN SOD (PORK) LOCK FLUSH 100 UNIT/ML IV SOLN
500.0000 [IU] | Freq: Once | INTRAVENOUS | Status: AC | PRN
  Administered 2023-04-27: 500 [IU]

## 2023-04-27 MED ORDER — SODIUM CHLORIDE 0.9 % IV SOLN
150.0000 mg | Freq: Once | INTRAVENOUS | Status: AC
  Administered 2023-04-27: 150 mg via INTRAVENOUS
  Filled 2023-04-27: qty 150

## 2023-04-27 MED ORDER — DEXTROSE 5 % IV SOLN: Freq: Once | INTRAVENOUS | Status: AC

## 2023-04-27 NOTE — Patient Instructions (Signed)
 [facility]  Discharge Instructions: Thank you for choosing [facility] to provide your oncology and hematology care.   If you have a lab appointment with the Cancer Center, please go directly to the Cancer Center and check in at the registration area.  Wear comfortable clothing and clothing appropriate for easy access to any Portacath or PICC line.   We strive to give you quality time with your provider. You may need to reschedule your appointment if you arrive late (15 or more minutes).  Arriving late affects you and other patients whose appointments are after yours.  Also, if you miss three or more appointments without notifying the office, you may be dismissed from the clinic at the provider's discretion.      For prescription refill requests, have your pharmacy contact our office and allow 72 hours for refills to be completed.    Today you received the following chemotherapy and/or immunotherapy agents Enhertu      To help prevent nausea and vomiting after your treatment, we encourage you to take your nausea medication as directed.  BELOW ARE SYMPTOMS THAT SHOULD BE REPORTED IMMEDIATELY: *FEVER GREATER THAN 100.4 F (38 C) OR HIGHER *CHILLS OR SWEATING *NAUSEA AND VOMITING THAT IS NOT CONTROLLED WITH YOUR NAUSEA MEDICATION *UNUSUAL SHORTNESS OF BREATH *UNUSUAL BRUISING OR BLEEDING *URINARY PROBLEMS (pain or burning when urinating, or frequent urination) *BOWEL PROBLEMS (unusual diarrhea, constipation, pain near the anus) TENDERNESS IN MOUTH AND THROAT WITH OR WITHOUT PRESENCE OF ULCERS (sore throat, sores in mouth, or a toothache) UNUSUAL RASH, SWELLING OR PAIN  UNUSUAL VAGINAL DISCHARGE OR ITCHING   Items with * indicate a potential emergency and should be followed up as soon as possible or go to the Emergency Department if any problems should occur.  Please show the CHEMOTHERAPY ALERT CARD or IMMUNOTHERAPY  ALERT CARD at check-in to the Emergency Department and triage nurse. Should you have questions after your visit or need to cancel or reschedule your appointment, please contact [facility] - A DEPT OF [facility]  [phone] and follow the prompts.  Office hours are 8:00 a.m. to 4:30 p.m. Monday - Friday. Please note that voicemails left after 4:00 p.m. may not be returned until the following business day.  We are closed weekends and major holidays. You have access to a nurse at all times for urgent questions. Please call the main number to the clinic [phone] and follow the prompts.  For any non-urgent questions, you may also contact your provider using MyChart. We now offer e-Visits for anyone 48 and older to request care online for non-urgent symptoms. For details visit [email or web address].   Also download the MyChart app! Go to the app store, search "MyChart", open the app, select , and log in with your MyChart username and password.

## 2023-05-01 ENCOUNTER — Encounter: Payer: Self-pay | Admitting: Hematology and Oncology

## 2023-05-01 ENCOUNTER — Telehealth: Payer: Self-pay | Admitting: *Deleted

## 2023-05-01 MED ORDER — CARVEDILOL 3.125 MG PO TABS: 3.1250 mg | ORAL_TABLET | Freq: Two times a day (BID) | ORAL | 0 refills | Status: DC

## 2023-05-01 NOTE — Telephone Encounter (Addendum)
This RN spoke with pt per below communication - discussed medication recommended by Dr Wyline Mood as well as request for visit in 2 weeks.  Pt verbalized understanding and is in agreement to recommendations.  Prescription for Cardvedilol sent to pt's phamacy.   ----- Message from Mehan Iruku sent at 04/28/2023  9:23 AM EST ----- Ok sure, Val, can u discuss this with Diamond Diamond Marshall kindly. ----- Message ----- From: Maisie Fus, MD Sent: 04/27/2023   7:40 AM EST To: Tressia Danas, MD  Absolutely. Her EF still looks fine. You can continue therapy. Her Bp is up; you can add carvedilol 3.125 mg BID with her abnormal  strain.  Tacey Ruiz can you please schedule this patient to see me in 2 weeks? You can double book. ----- Message ----- From: Rachel Moulds, MD Sent: 04/26/2023   1:00 PM EST To: Maisie Fus, MD  Dr Wyline Mood  Can you see Diamond Marshall. She is on Enhertu for met breast cancer, EF drop noted, we are continuing the drug but we hope you can see her in 3 weeks so we know if we have to hold next infusion.  Thanks,

## 2023-05-08 ENCOUNTER — Encounter: Payer: Self-pay | Admitting: Hematology and Oncology

## 2023-05-09 ENCOUNTER — Other Ambulatory Visit: Payer: Self-pay | Admitting: Adult Health

## 2023-05-09 DIAGNOSIS — E876 Hypokalemia: Secondary | ICD-10-CM

## 2023-05-10 ENCOUNTER — Encounter: Payer: Self-pay | Admitting: Hematology and Oncology

## 2023-05-17 MED FILL — Fosaprepitant Dimeglumine For IV Infusion 150 MG (Base Eq): INTRAVENOUS | Qty: 5 | Status: AC

## 2023-05-18 ENCOUNTER — Inpatient Hospital Stay: Payer: Commercial Managed Care - PPO

## 2023-05-18 ENCOUNTER — Inpatient Hospital Stay (HOSPITAL_BASED_OUTPATIENT_CLINIC_OR_DEPARTMENT_OTHER): Payer: Commercial Managed Care - PPO | Admitting: Hematology and Oncology

## 2023-05-18 VITALS — BP 144/79 | HR 73 | Temp 98.3°F | Resp 16 | Wt 295.1 lb

## 2023-05-18 DIAGNOSIS — Z170 Estrogen receptor positive status [ER+]: Secondary | ICD-10-CM

## 2023-05-18 DIAGNOSIS — C50212 Malignant neoplasm of upper-inner quadrant of left female breast: Secondary | ICD-10-CM | POA: Diagnosis not present

## 2023-05-18 DIAGNOSIS — Z95828 Presence of other vascular implants and grafts: Secondary | ICD-10-CM

## 2023-05-18 DIAGNOSIS — Z5112 Encounter for antineoplastic immunotherapy: Secondary | ICD-10-CM | POA: Diagnosis not present

## 2023-05-18 LAB — CBC WITH DIFFERENTIAL (CANCER CENTER ONLY)
Abs Immature Granulocytes: 0.03 10*3/uL (ref 0.00–0.07)
Basophils Absolute: 0 10*3/uL (ref 0.0–0.1)
Basophils Relative: 1 %
Eosinophils Absolute: 0.2 10*3/uL (ref 0.0–0.5)
Eosinophils Relative: 3 %
HCT: 27.2 % — ABNORMAL LOW (ref 36.0–46.0)
Hemoglobin: 8.9 g/dL — ABNORMAL LOW (ref 12.0–15.0)
Immature Granulocytes: 1 %
Lymphocytes Relative: 44 %
Lymphs Abs: 2.7 10*3/uL (ref 0.7–4.0)
MCH: 29.6 pg (ref 26.0–34.0)
MCHC: 32.7 g/dL (ref 30.0–36.0)
MCV: 90.4 fL (ref 80.0–100.0)
Monocytes Absolute: 0.6 10*3/uL (ref 0.1–1.0)
Monocytes Relative: 10 %
Neutro Abs: 2.5 10*3/uL (ref 1.7–7.7)
Neutrophils Relative %: 41 %
Platelet Count: 412 10*3/uL — ABNORMAL HIGH (ref 150–400)
RBC: 3.01 MIL/uL — ABNORMAL LOW (ref 3.87–5.11)
RDW: 28.1 % — ABNORMAL HIGH (ref 11.5–15.5)
WBC Count: 6 10*3/uL (ref 4.0–10.5)
nRBC: 0.3 % — ABNORMAL HIGH (ref 0.0–0.2)

## 2023-05-18 LAB — CMP (CANCER CENTER ONLY)
ALT: 17 U/L (ref 0–44)
AST: 22 U/L (ref 15–41)
Albumin: 3.3 g/dL — ABNORMAL LOW (ref 3.5–5.0)
Alkaline Phosphatase: 93 U/L (ref 38–126)
Anion gap: 4 — ABNORMAL LOW (ref 5–15)
BUN: 11 mg/dL (ref 6–20)
CO2: 30 mmol/L (ref 22–32)
Calcium: 8.6 mg/dL — ABNORMAL LOW (ref 8.9–10.3)
Chloride: 111 mmol/L (ref 98–111)
Creatinine: 0.69 mg/dL (ref 0.44–1.00)
GFR, Estimated: >60 mL/min (ref >60)
Glucose, Bld: 88 mg/dL (ref 70–99)
Potassium: 3.5 mmol/L (ref 3.5–5.1)
Sodium: 145 mmol/L (ref 135–145)
Total Bilirubin: 0.5 mg/dL (ref <1.2)
Total Protein: 6 g/dL — ABNORMAL LOW (ref 6.5–8.1)

## 2023-05-18 MED ORDER — SODIUM CHLORIDE 0.9% FLUSH
10.0000 mL | Freq: Once | INTRAVENOUS | Status: AC
Start: 2023-05-18 — End: 2023-05-18
  Administered 2023-05-18: 10 mL

## 2023-05-18 MED ORDER — DEXTROSE 5 % IV SOLN: Freq: Once | INTRAVENOUS | Status: AC

## 2023-05-18 MED ORDER — FAM-TRASTUZUMAB DERUXTECAN-NXKI CHEMO 100 MG IV SOLR
5.4000 mg/kg | Freq: Once | INTRAVENOUS | Status: AC
  Administered 2023-05-18: 800 mg via INTRAVENOUS
  Filled 2023-05-18: qty 40

## 2023-05-18 MED ORDER — DIPHENHYDRAMINE HCL 25 MG PO CAPS
50.0000 mg | ORAL_CAPSULE | Freq: Once | ORAL | Status: AC
  Administered 2023-05-18: 50 mg via ORAL
  Filled 2023-05-18: qty 2

## 2023-05-18 MED ORDER — SODIUM CHLORIDE 0.9% FLUSH
10.0000 mL | INTRAVENOUS | Status: DC | PRN
Start: 2023-05-18 — End: 2023-05-18
  Administered 2023-05-18: 10 mL

## 2023-05-18 MED ORDER — DEXAMETHASONE SODIUM PHOSPHATE 10 MG/ML IJ SOLN
10.0000 mg | Freq: Once | INTRAMUSCULAR | Status: AC
  Administered 2023-05-18: 10 mg via INTRAVENOUS
  Filled 2023-05-18: qty 1

## 2023-05-18 MED ORDER — HEPARIN SOD (PORK) LOCK FLUSH 100 UNIT/ML IV SOLN
500.0000 [IU] | Freq: Once | INTRAVENOUS | Status: AC | PRN
  Administered 2023-05-18: 500 [IU]

## 2023-05-18 MED ORDER — PALONOSETRON HCL INJECTION 0.25 MG/5ML
0.2500 mg | Freq: Once | INTRAVENOUS | Status: AC
Start: 2023-05-18 — End: 2023-05-18
  Administered 2023-05-18: 0.25 mg via INTRAVENOUS
  Filled 2023-05-18: qty 5

## 2023-05-18 MED ORDER — ACETAMINOPHEN 325 MG PO TABS
650.0000 mg | ORAL_TABLET | Freq: Once | ORAL | Status: AC
  Administered 2023-05-18: 650 mg via ORAL
  Filled 2023-05-18: qty 2

## 2023-05-18 MED ORDER — CYANOCOBALAMIN 1000 MCG/ML IJ SOLN: INTRAMUSCULAR | 3 refills | Status: AC

## 2023-05-18 MED ORDER — FOSAPREPITANT DIMEGLUMINE INJECTION 150 MG
150.0000 mg | Freq: Once | INTRAVENOUS | Status: AC
  Administered 2023-05-18: 150 mg via INTRAVENOUS
  Filled 2023-05-18: qty 150

## 2023-05-18 NOTE — Progress Notes (Signed)
Per Dr. Pamelia Hoit, OK to treat today with drop in patient's EF (60-65% on 8/17, 50-55% on 11/22).

## 2023-05-18 NOTE — Assessment & Plan Note (Signed)
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  (Left breast mass, left cervical, axillary, subpectoral, supraclavicular and prevascular mediastinal lymph nodes, no metastasis in the abdomen)  07/11/2022-01/16/2023: THP followed by HP maintenance 02/01/2023: Enhertu cycle 6 today    Breast Cancer Stable skin thickening on CT scan, lymph nodes improved. No definitive metastatic disease, but a 2mm lung nodule noted.  -Refer back to surgeon for re-evaluation and possible mastectomy on left lymph node removal.

## 2023-05-18 NOTE — Progress Notes (Signed)
Patient Care Team: Mechele Claude, MD as PCP - General (Family Medicine) Harriette Bouillon, MD as Consulting Physician (General Surgery) Rachel Moulds, MD as Consulting Physician (Hematology and Oncology) Lonie Peak, MD as Attending Physician (Radiation Oncology) Pershing Proud, RN as Oncology Nurse Navigator Donnelly Angelica, RN as Oncology Nurse Navigator  DIAGNOSIS:  Encounter Diagnosis  Name Primary?   Malignant neoplasm of upper-inner quadrant of left breast in female, estrogen receptor positive (HCC) Yes    SUMMARY OF ONCOLOGIC HISTORY: Oncology History  Malignant neoplasm of upper-inner quadrant of left breast in female, estrogen receptor positive (HCC)  06/06/2022 Mammogram   Diagnostic mammogram given palpable mass showed suspicious spiculated left breast mass with surrounding nodularity at 9 o'clock position.  This measures up to 3.1 cm sonographically however due to deep location and patient body habitus the mammographic measurement of 5.8 cm is felt to be more accurate.  At least 2 abnormal lymph nodes in the left axilla.  Possible abnormal palpable left supraclavicular lymph node.   06/06/2022 Breast US   Breast ultrasound confirmed these findings.   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  Prognostic showed ER 65% weak to strong staining, PR 15% weak to moderate staining, Ki-67 of 50% and HER2 positive by IHC at 3+    Genetic Testing   [facility] Custom Panel+RNA was Negative. Report date is 06/28/2022.   The Custom Hereditary Cancers Panel offered by [facility] includes sequencing and/or deletion duplication testing of the following 43 genes: APC, ATM, AXIN2, BAP1, BARD1, BMPR1A, BRCA1, BRCA2, BRIP1, CDH1, CDK4, CDKN2A (p14ARF and p16INK4a only), CHEK2, CTNNA1, EPCAM (Deletion/duplication testing only),  FH, GREM1 (promoter region duplication testing only), HOXB13, KIT, MBD4, MEN1, MLH1, MSH2, MSH3, MSH6, MUTYH, NF1, NHTL1, PALB2, PDGFRA, PMS2, POLD1, POLE, PTEN, RAD51C, RAD51D, SMAD4, SMARCA4. STK11, TP53, TSC1, TSC2, and VHL.    06/29/2022 PET scan   IMPRESSION: 1. Hypermetabolic left breast mass, compatible with primary breast malignancy. 2. Hypermetabolic left cervical, axillary, subpectoral, supraclavicular and prevascular mediastinal lymph nodes, compatible with nodal metastatic disease. 3. No evidence of metastatic disease in the abdomen or pelvis. 4. Aortic Atherosclerosis (ICD10-I70.0).     Electronically Signed   By: Allegra Lai M.D.   On: 06/29/2022 09:32   07/11/2022 - 01/16/2023 Chemotherapy   Patient is on Treatment Plan : BREAST DOCEtaxel + Trastuzumab + Pertuzumab (THP) q21d x 8 cycles / Trastuzumab + Pertuzumab q21d x 4 cycles     02/01/2023 -  Chemotherapy   Patient is on Treatment Plan : BREAST METASTATIC Fam-Trastuzumab Deruxtecan-nxki (Enhertu) (5.4) q21d       CHIEF COMPLIANT: Cycle 6 Enhertu  HISTORY OF PRESENT ILLNESS:   History of Present Illness   The patient, with a history of breast cancer receiving Enhertu, presents with ongoing gastrointestinal issues, improved since being on budesonide. She reports a significant improvement in her symptoms, estimating an 85% reduction in severity, but still experiences occasional loose stools. She also mentions a sore in her mouth and a constant feeling of coldness, which she attributes to her chemotherapy treatment. The patient also reports a significant weight loss of fifty pounds since February, which she attributes to her previous treatment and ongoing gastrointestinal issues. She also mentions having pernicious anemia and is on B12 shots, which she admits to not taking regularly. The patient is also considering a mastectomy  and is scheduled to meet with a surgeon.         ALLERGIES:  is allergic to misc.  sulfonamide containing compounds, phentermine, bee venom, peanut allergen powder-dnfp, topiramate, septra [sulfamethoxazole-trimethoprim], and sulfa antibiotics.  MEDICATIONS:  Current Outpatient Medications  Medication Sig Dispense Refill   promethazine (PHENERGAN) 25 MG tablet Take 1 tablet (25 mg total) by mouth every 6 (six) hours as needed for nausea or vomiting. 30 tablet 0   budesonide (ENTOCORT EC) 3 MG 24 hr capsule Take 3 capsules (9 mg total) by mouth daily. 90 capsule 1   carvedilol (COREG) 3.125 MG tablet Take 1 tablet (3.125 mg total) by mouth 2 (two) times daily with a meal. 60 tablet 0   colestipol (COLESTID) 5 g granules Take 5 g by mouth 2 (two) times daily. 500 g 12   cyanocobalamin (VITAMIN B12) 1000 MCG/ML injection INJECT 1 ML INTRAMUSCULARLY EVERY 15 DAYS 30 mL 3   diclofenac (VOLTAREN) 75 MG EC tablet TAKE ONE TABLET TWICE DAILY AS NEEDED. needs appointment FOR refills 180 tablet 1   diphenoxylate-atropine (LOMOTIL) 2.5-0.025 MG tablet TAKE TWO TABLETS EVERY 6 HOURS AS NEEDED FOR DIARRHEA 60 tablet 1   EPINEPHrine 0.3 mg/0.3 mL IJ SOAJ injection Inject 0.3 mg into the muscle as needed for anaphylaxis. 2 each 0   escitalopram (LEXAPRO) 20 MG tablet Take 1 tablet (20 mg total) by mouth daily. 90 tablet 3   hydrocortisone (ANUSOL-HC) 2.5 % rectal cream Place 1 Application rectally 2 (two) times daily. 30 g 2   lidocaine (XYLOCAINE) 2 % jelly 1 Application by Other route as needed. 30 mL 2   lisinopril (ZESTRIL) 20 MG tablet Take 1 tablet (20 mg total) by mouth daily. 90 tablet 1   LORazepam (ATIVAN) 0.5 MG tablet Take 1 tablet (0.5 mg total) by mouth every 8 (eight) hours as needed for anxiety. 30 tablet 1   pantoprazole (PROTONIX) 40 MG tablet Take 1 tablet (40 mg total) by mouth daily. 90 tablet 3   potassium chloride (KLOR-CON) 10 MEQ tablet TAKE TWO TABLETS DAILY AS DIRECTED 60 tablet 0   rifaximin (XIFAXAN) 200 MG tablet Take 1 tablet (200 mg total) by mouth 3 (three)  times daily. For chemo related diarrhea 90 tablet 5   No current facility-administered medications for this visit.    PHYSICAL EXAMINATION: ECOG PERFORMANCE STATUS: 1 - Symptomatic but completely ambulatory  Vitals:   05/18/23 1012  BP: (!) 144/79  Pulse: 73  Resp: 16  Temp: 98.3 F (36.8 C)  SpO2: 99%   Filed Weights   05/18/23 1012  Weight: 295 lb 1.6 oz (133.9 kg)      LABORATORY DATA:  I have reviewed the data as listed    Latest Ref Rng & Units 05/18/2023    9:55 AM 04/26/2023   11:56 AM 04/05/2023   10:13 AM  CMP  Glucose 70 - 99 mg/dL 88  161  096   BUN 6 - 20 mg/dL 11  8  6    Creatinine 0.44 - 1.00 mg/dL 0.45  4.09  8.11   Sodium 135 - 145 mmol/L 145  142  144   Potassium 3.5 - 5.1 mmol/L 3.5  3.1  3.1   Chloride 98 - 111 mmol/L 111  108  108   CO2 22 - 32 mmol/L 30  28  29    Calcium 8.9 - 10.3 mg/dL 8.6  8.5  8.5   Total Protein 6.5 - 8.1 g/dL 6.0  6.0  6.1   Total Bilirubin <1.2 mg/dL 0.5  0.4  0.4   Alkaline Phos 38 - 126 U/L 93  97  101   AST 15 - 41 U/L 22  37  33   ALT 0 - 44 U/L 17  25  24      Lab Results  Component Value Date   WBC 6.0 05/18/2023   HGB 8.9 (L) 05/18/2023   HCT 27.2 (L) 05/18/2023   MCV 90.4 05/18/2023   PLT 412 (H) 05/18/2023   NEUTROABS 2.5 05/18/2023    ASSESSMENT & PLAN:  Malignant neoplasm of upper-inner quadrant of left breast in female, 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  (Left breast mass, left cervical, axillary, subpectoral, supraclavicular and prevascular mediastinal lymph nodes, no metastasis in the abdomen)  07/11/2022-01/16/2023: THP followed by HP maintenance 02/01/2023: Enhertu cycle 6 today   Stable skin thickening on CT scan, lymph nodes improved. No definitive metastatic disease, but a 2mm lung nodule noted.  -Dr. Al Pimple discussed with Dr. Luisa Hart for re-evaluation and possible mastectomy on left axillary lymph node removal. If she does not  proceed with immediate surgery then she will need additional treatments to be planned. Patient is concerned about long-term Enhertu and its toxicities.  If she does not have any visible cancer after surgery she wishes to discuss with Dr. Al Pimple about going on Herceptin Perjeta maintenance as opposed to Enhertu maintenance.   No orders of the defined types were placed in this encounter.  The patient has a good understanding of the overall plan. she agrees with it. she will call with any problems that may develop before the next visit here. Total time spent: 30 mins including face to face time and time spent for planning, charting and co-ordination of care   Tamsen Meek, MD 05/18/23

## 2023-05-18 NOTE — Patient Instructions (Signed)
 [facility]  Discharge Instructions: Thank you for choosing [facility] to provide your oncology and hematology care.   If you have a lab appointment with the Cancer Center, please go directly to the Cancer Center and check in at the registration area.   Wear comfortable clothing and clothing appropriate for easy access to any Portacath or PICC line.   We strive to give you quality time with your provider. You may need to reschedule your appointment if you arrive late (15 or more minutes).  Arriving late affects you and other patients whose appointments are after yours.  Also, if you miss three or more appointments without notifying the office, you may be dismissed from the clinic at the provider's discretion.      For prescription refill requests, have your pharmacy contact our office and allow 72 hours for refills to be completed.    Today you received the following chemotherapy and/or immunotherapy agents: Enhertu      To help prevent nausea and vomiting after your treatment, we encourage you to take your nausea medication as directed.  BELOW ARE SYMPTOMS THAT SHOULD BE REPORTED IMMEDIATELY: *FEVER GREATER THAN 100.4 F (38 C) OR HIGHER *CHILLS OR SWEATING *NAUSEA AND VOMITING THAT IS NOT CONTROLLED WITH YOUR NAUSEA MEDICATION *UNUSUAL SHORTNESS OF BREATH *UNUSUAL BRUISING OR BLEEDING *URINARY PROBLEMS (pain or burning when urinating, or frequent urination) *BOWEL PROBLEMS (unusual diarrhea, constipation, pain near the anus) TENDERNESS IN MOUTH AND THROAT WITH OR WITHOUT PRESENCE OF ULCERS (sore throat, sores in mouth, or a toothache) UNUSUAL RASH, SWELLING OR PAIN  UNUSUAL VAGINAL DISCHARGE OR ITCHING   Items with * indicate a potential emergency and should be followed up as soon as possible or go to the Emergency Department if any problems should occur.  Please show the CHEMOTHERAPY ALERT CARD or IMMUNOTHERAPY  ALERT CARD at check-in to the Emergency Department and triage nurse.  Should you have questions after your visit or need to cancel or reschedule your appointment, please contact [facility]  Dept: [phone]  and follow the prompts.  Office hours are 8:00 a.m. to 4:30 p.m. Monday - Friday. Please note that voicemails left after 4:00 p.m. may not be returned until the following business day.  We are closed weekends and major holidays. You have access to a nurse at all times for urgent questions. Please call the main number to the clinic Dept: [phone] and follow the prompts.   For any non-urgent questions, you may also contact your provider using MyChart. We now offer e-Visits for anyone 70 and older to request care online for non-urgent symptoms. For details visit [email or web address].   Also download the MyChart app! Go to the app store, search "MyChart", open the app, select [place], and log in with your MyChart username and password.

## 2023-05-22 ENCOUNTER — Other Ambulatory Visit: Payer: Self-pay

## 2023-05-22 ENCOUNTER — Encounter (HOSPITAL_COMMUNITY): Payer: Self-pay

## 2023-05-22 ENCOUNTER — Emergency Department (HOSPITAL_COMMUNITY): Payer: Commercial Managed Care - PPO

## 2023-05-22 ENCOUNTER — Emergency Department (HOSPITAL_COMMUNITY)
Admission: EM | Admit: 2023-05-22 | Discharge: 2023-05-22 | Disposition: A | Discharge status: Home / Self Care | Payer: Commercial Managed Care - PPO | Attending: Emergency Medicine | Admitting: Emergency Medicine

## 2023-05-22 DIAGNOSIS — R0789 Other chest pain: Secondary | ICD-10-CM | POA: Diagnosis present

## 2023-05-22 DIAGNOSIS — R002 Palpitations: Secondary | ICD-10-CM | POA: Diagnosis not present

## 2023-05-22 DIAGNOSIS — C7981 Secondary malignant neoplasm of breast: Secondary | ICD-10-CM | POA: Insufficient documentation

## 2023-05-22 DIAGNOSIS — R079 Chest pain, unspecified: Secondary | ICD-10-CM

## 2023-05-22 LAB — CBC
HCT: 29.7 % — ABNORMAL LOW (ref 36.0–46.0)
Hemoglobin: 9.4 g/dL — ABNORMAL LOW (ref 12.0–15.0)
MCH: 29.8 pg (ref 26.0–34.0)
MCHC: 31.6 g/dL (ref 30.0–36.0)
MCV: 94.3 fL (ref 80.0–100.0)
Platelets: 299 10*3/uL (ref 150–400)
RBC: 3.15 MIL/uL — ABNORMAL LOW (ref 3.87–5.11)
RDW: 26.7 % — ABNORMAL HIGH (ref 11.5–15.5)
WBC: 3.7 10*3/uL — ABNORMAL LOW (ref 4.0–10.5)
nRBC: 0 % (ref 0.0–0.2)

## 2023-05-22 LAB — BASIC METABOLIC PANEL
Anion gap: 8 (ref 5–15)
BUN: 20 mg/dL (ref 6–20)
CO2: 22 mmol/L (ref 22–32)
Calcium: 8.1 mg/dL — ABNORMAL LOW (ref 8.9–10.3)
Chloride: 107 mmol/L (ref 98–111)
Creatinine, Ser: 0.47 mg/dL (ref 0.44–1.00)
GFR, Estimated: >60 mL/min (ref >60)
Glucose, Bld: 119 mg/dL — ABNORMAL HIGH (ref 70–99)
Potassium: 3.6 mmol/L (ref 3.5–5.1)
Sodium: 137 mmol/L (ref 135–145)

## 2023-05-22 LAB — TROPONIN I (HIGH SENSITIVITY)
Troponin I (High Sensitivity): 4 ng/L (ref <18)
Troponin I (High Sensitivity): 6 ng/L (ref <18)

## 2023-05-22 LAB — D-DIMER, QUANTITATIVE: D-Dimer, Quant: 0.34 ug{FEU}/mL (ref 0.00–0.50)

## 2023-05-22 MED ORDER — SODIUM CHLORIDE (PF) 0.9 % IJ SOLN
INTRAMUSCULAR | Status: AC
  Filled 2023-05-22: qty 50

## 2023-05-22 MED ORDER — IOHEXOL 350 MG/ML SOLN
100.0000 mL | Freq: Once | INTRAVENOUS | Status: AC | PRN
  Administered 2023-05-22: 100 mL via INTRAVENOUS

## 2023-05-22 NOTE — ED Triage Notes (Signed)
 Pt states that she woke up to use restroom at approx 0130 and had sudden onset of palpitations and chest pressure that radiated to mid-back. Pt's Apple watch showed that her HR was between 164-190. Pt reports that the palpitations and chest pain have subsided but she does still have sharp pain in middle of back. Pt is currently receiving chemo for Stage IV breast cancer and last treatment was 3 days ago.

## 2023-05-22 NOTE — ED Notes (Signed)
Pt discharged home. Discharge information discussed. No s/s of distress observed during discharge. 

## 2023-05-22 NOTE — ED Provider Notes (Signed)
 Received patient in turnover from Dr. Haze.  Please see their note for further details of Hx, PE.  Briefly patient is a 52 y.o. female with a Palpitations .  D-dimer negative.  Plan for CT angiogram of the chest to assess for dissection.  2 troponins are negative.  Patient's dissection study is resulted and is negative.  Will have the patient follow-up as an outpatient.SABRA Quale, Rolan, DO 05/22/23 [phone]

## 2023-05-22 NOTE — Discharge Instructions (Signed)
Your imaging looked good.  Follow up with your doctor in the office. Return for worsening symptoms.

## 2023-05-22 NOTE — ED Provider Notes (Signed)
 [place] EMERGENCY DEPARTMENT AT [facility] Provider Note   CSN: [id number] Arrival date & time: 05/22/23  0220     History  Chief Complaint  Patient presents with   Palpitations    Diamond Marshall is a 52 y.o. female.  Patient presents with complaints of chest discomfort, heart palpitations, back pain.  Patient reports that she started to feel palpitations and her Apple Watch alerted her to a heart rate greater than 160.  At that time she was having chest discomfort which has resolved but she is still having pain in her back.  She reports that this pain feels like someone had kicked her in the back.  Patient reports that she has stage IV breast cancer and is undergoing chemotherapy.  She has had multiple echos for monitoring during her treatment and her most recent echo showed decreased ejection fraction.  She has follow-up scheduled with cardiology next week.       Home Medications Prior to Admission medications   Medication Sig Start Date End Date Taking? Authorizing Provider  promethazine  (PHENERGAN ) 25 MG tablet Take 1 tablet (25 mg total) by mouth every 6 (six) hours as needed for nausea or vomiting. 03/06/23   Iruku, Praveena, MD  budesonide  (ENTOCORT EC ) 3 MG 24 hr capsule Take 3 capsules (9 mg total) by mouth daily. 04/17/23   Iruku, Praveena, MD  carvedilol  (COREG ) 3.125 MG tablet Take 1 tablet (3.125 mg total) by mouth 2 (two) times daily with a meal. 05/01/23   [name], MD  colestipol  (COLESTID ) 5 g granules Take 5 g by mouth 2 (two) times daily. 01/22/23   Zollie Lowers, MD  cyanocobalamin  (VITAMIN B12) 1000 MCG/ML injection INJECT 1 ML INTRAMUSCULARLY EVERY 15 DAYS 05/18/23   Gudena, Vinay, MD  diclofenac  (VOLTAREN ) 75 MG EC tablet TAKE ONE TABLET TWICE DAILY AS NEEDED. needs appointment FOR refills 11/15/22   Zollie Lowers, MD  diphenoxylate -atropine  (LOMOTIL ) 2.5-0.025 MG tablet TAKE TWO TABLETS EVERY 6 HOURS AS NEEDED FOR DIARRHEA 04/17/23   Iruku,  Praveena, MD  EPINEPHrine  0.3 mg/0.3 mL IJ SOAJ injection Inject 0.3 mg into the muscle as needed for anaphylaxis. 12/16/21   Merlynn Niki FALCON, FNP  escitalopram  (LEXAPRO ) 20 MG tablet Take 1 tablet (20 mg total) by mouth daily. 01/03/23   Zollie Lowers, MD  hydrocortisone  (ANUSOL -HC) 2.5 % rectal cream Place 1 Application rectally 2 (two) times daily. 01/09/23   Jarold Olam HERO, PA-C  lidocaine  (XYLOCAINE ) 2 % jelly 1 Application by Other route as needed. 01/09/23   Jarold Olam HERO, PA-C  lisinopril  (ZESTRIL ) 20 MG tablet Take 1 tablet (20 mg total) by mouth daily. 01/03/23   Zollie Lowers, MD  LORazepam  (ATIVAN ) 0.5 MG tablet Take 1 tablet (0.5 mg total) by mouth every 8 (eight) hours as needed for anxiety. 06/22/22   Iruku, Praveena, MD  pantoprazole  (PROTONIX ) 40 MG tablet Take 1 tablet (40 mg total) by mouth daily. 01/03/23   Zollie Lowers, MD  potassium chloride  (KLOR-CON ) 10 MEQ tablet TAKE TWO TABLETS DAILY AS DIRECTED 05/09/23   Causey, Morna Pickle, NP  rifaximin  (XIFAXAN ) 200 MG tablet Take 1 tablet (200 mg total) by mouth 3 (three) times daily. For chemo related diarrhea 01/03/23   Zollie Lowers, MD      Allergies    Misc. sulfonamide containing compounds, Phentermine , Bee venom, Peanut allergen powder-dnfp, Topiramate , Septra [sulfamethoxazole-trimethoprim], and Sulfa antibiotics    Review of Systems   Review of Systems  Physical Exam Updated Vital Signs  BP (!) 140/82 (BP Location: Left Arm)   Pulse 76   Temp 97.9 F (36.6 C) (Oral)   Resp 20   Ht 5' 1 (1.549 m)   Wt 131.5 kg   SpO2 97%   BMI 54.80 kg/m  Physical Exam Vitals and nursing note reviewed.  Constitutional:      General: She is not in acute distress.    Appearance: She is well-developed.  HENT:     Head: Normocephalic and atraumatic.     Mouth/Throat:     Mouth: Mucous membranes are moist.  Eyes:     General: Vision grossly intact. Gaze aligned appropriately.     Extraocular Movements: Extraocular  movements intact.     Conjunctiva/sclera: Conjunctivae normal.  Cardiovascular:     Rate and Rhythm: Normal rate and regular rhythm.     Pulses: Normal pulses.     Heart sounds: Normal heart sounds, S1 normal and S2 normal. No murmur heard.    No friction rub. No gallop.  Pulmonary:     Effort: Pulmonary effort is normal. No respiratory distress.     Breath sounds: Normal breath sounds.  Abdominal:     General: Bowel sounds are normal.     Palpations: Abdomen is soft.     Tenderness: There is no abdominal tenderness. There is no guarding or rebound.     Hernia: No hernia is present.  Musculoskeletal:        General: No swelling.     Cervical back: Full passive range of motion without pain, normal range of motion and neck supple. No spinous process tenderness or muscular tenderness. Normal range of motion.     Right lower leg: No edema.     Left lower leg: No edema.  Skin:    General: Skin is warm and dry.     Capillary Refill: Capillary refill takes less than 2 seconds.     Findings: No ecchymosis, erythema, rash or wound.  Neurological:     General: No focal deficit present.     Mental Status: She is alert and oriented to person, place, and time.     GCS: GCS eye subscore is 4. GCS verbal subscore is 5. GCS motor subscore is 6.     Cranial Nerves: Cranial nerves 2-12 are intact.     Sensory: Sensation is intact.     Motor: Motor function is intact.     Coordination: Coordination is intact.  Psychiatric:        Attention and Perception: Attention normal.        Mood and Affect: Mood normal.        Speech: Speech normal.        Behavior: Behavior normal.     ED Results / Procedures / Treatments   Labs (all labs ordered are listed, but only abnormal results are displayed) Labs Reviewed  BASIC METABOLIC PANEL - Abnormal; Notable for the following components:      Result Value   Glucose, Bld 119 (*)    Calcium 8.1 (*)    All other components within normal limits  CBC -  Abnormal; Notable for the following components:   WBC 3.7 (*)    RBC 3.15 (*)    Hemoglobin 9.4 (*)    HCT 29.7 (*)    RDW 26.7 (*)    All other components within normal limits  D-DIMER, QUANTITATIVE  TROPONIN I (HIGH SENSITIVITY)  TROPONIN I (HIGH SENSITIVITY)    EKG EKG Interpretation Date/Time:  Tuesday  May 22 2023 02:32:58 EST Ventricular Rate:  95 PR Interval:  142 QRS Duration:  83 QT Interval:  365 QTC Calculation: 459 R Axis:   5  Text Interpretation: Sinus rhythm Ventricular premature complex LVH by voltage Inferior infarct, old Confirmed by Haze Lonni PARAS [phone]) on 05/22/2023 3:54:59 AM  Radiology DG Chest Port 1 View Result Date: 05/22/2023 CLINICAL DATA:  Current receiving chemotherapy for breast cancer, presenting with palpitations and chest pain. EXAM: PORTABLE CHEST 1 VIEW COMPARISON:  August 22, 2022 FINDINGS: There is stable right-sided venous Port-A-Cath positioning. The cardiac silhouette is mildly enlarged and unchanged in size. Low lung volumes are noted with subsequent crowding of the bronchovascular lung markings. There is no evidence of an acute infiltrate, pleural effusion or pneumothorax. The visualized skeletal structures are unremarkable. IMPRESSION: Low lung volumes without evidence of acute or active cardiopulmonary disease. Electronically Signed   By: Suzen Dials M.D.   On: 05/22/2023 05:54    Procedures Procedures    Medications Ordered in ED Medications  iohexol  (OMNIPAQUE ) 350 MG/ML injection 100 mL (100 mLs Intravenous Contrast Given 05/22/23 9257)    ED Course/ Medical Decision Making/ A&P                                 Medical Decision Making Amount and/or Complexity of Data Reviewed Labs: ordered. Radiology: ordered.  Risk Prescription drug management.   Patient presents to the emergency department for evaluation of chest pain and palpitations.  Patient reports a high heart rate at home, confirmed by her  Apple Watch.  Patient with PVCs here but no other significant arrhythmia or tachycardia.  Patient currently receiving chemotherapy for breast cancer.  Chest pain was present before arrival, has resolved.  Patient with persistent back pain.  PE and aortic dissection considered as well as acute coronary syndrome.  Patient's EKG without ischemic changes.  Troponin negative x 2.  Doubt ACS.  Patient's D-dimer is negative making PE much less likely.  This likely makes aortic dissection less likely as well but will undergo CT angiography to rule out aortic syndrome.  Will signed out to oncoming ER physician.  Patient does have a cardiology appointment next week for follow-up after routine echo performed by her oncologist showed slightly decreased ejection fraction of 50 to 55%.  No signs of heart failure currently.        Final Clinical Impression(s) / ED Diagnoses Final diagnoses:  Chest pain, unspecified type  Heart palpitations    Rx / DC Orders ED Discharge Orders     None         Haze Lonni PARAS, MD 05/22/23 [phone]

## 2023-05-24 ENCOUNTER — Ambulatory Visit: Payer: Self-pay | Admitting: Surgery

## 2023-05-24 ENCOUNTER — Telehealth: Payer: Self-pay | Admitting: *Deleted

## 2023-05-24 NOTE — Telephone Encounter (Signed)
 Called Diamond Marshall to follow up on how she is doing. Bowels are much better, she thinks she had a stomach bug as her husband go the same thing.   Went to ED due to elevated HR and chest discomfort. To see cardiologist on 1/6. New patient appt with GI on 07/06/23. To see Dr Vanderbilt Friday to discuss surgery

## 2023-05-27 ENCOUNTER — Encounter: Payer: Self-pay | Admitting: Hematology and Oncology

## 2023-05-28 ENCOUNTER — Other Ambulatory Visit: Payer: Self-pay | Admitting: *Deleted

## 2023-05-28 ENCOUNTER — Ambulatory Visit: Payer: Commercial Managed Care - PPO | Admitting: Internal Medicine

## 2023-05-29 ENCOUNTER — Telehealth: Payer: Self-pay | Admitting: *Deleted

## 2023-05-29 ENCOUNTER — Other Ambulatory Visit: Payer: Self-pay

## 2023-05-29 ENCOUNTER — Other Ambulatory Visit: Payer: Self-pay | Admitting: *Deleted

## 2023-05-29 NOTE — Telephone Encounter (Signed)
 Pt states she saw the breast surgeon yesterday with surgery scheduled for 3/6/20245.  Pt states she needs chemo appt for chemo next week- she has no further appts scheduled here.  Per chart review pt does not have a current active treatment plan for enhertu .  This message will be forwarded to MD for treatment plan (if appropriate) for scheduling of appts.

## 2023-05-30 ENCOUNTER — Telehealth: Payer: Self-pay | Admitting: Hematology and Oncology

## 2023-05-30 ENCOUNTER — Other Ambulatory Visit: Payer: Self-pay | Admitting: Hematology and Oncology

## 2023-05-30 DIAGNOSIS — Z170 Estrogen receptor positive status [ER+]: Secondary | ICD-10-CM

## 2023-05-30 NOTE — Telephone Encounter (Signed)
 Spoke with patient confirming upcoming appointment

## 2023-05-31 ENCOUNTER — Encounter: Payer: Self-pay | Admitting: Hematology and Oncology

## 2023-05-31 NOTE — Progress Notes (Deleted)
  Cardiology Office Note:  .   Date:  05/31/2023  ID:  Diamond Marshall, DOB 1970/12/21, MRN [id number] PCP: Zollie Lowers, MD  [facility] Providers Cardiologist:  None { Click to update primary MD,subspecialty MD or APP then REFRESH:1}   History of Present Illness: .   Diamond Marshall is a 53 y.o. 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  However windows were poor.  TTE 09/29/2022 Echo was unchanged.  TTE 01/06/2023 Echo was unchanged.  TTE/20 06/2022 Her EF was noted to be 50 to 55% with a strain of -15%.  This was the first assessment of strain.  To me her EF still look normal   Risk Assessment/Calculations:        Physical Exam:   VS:  There were no vitals taken for this visit.   Wt Readings from Last 3 Encounters:  05/22/23 290 lb (131.5 kg)  05/18/23 295 lb 1.6 oz (133.9 kg)  04/26/23 295 lb (133.8 kg)    GEN: Well nourished, well developed in no acute distress NECK: No JVD; No carotid bruits CARDIAC: ***RRR, no murmurs, rubs, gallops RESPIRATORY:  Clear to auscultation without rales, wheezing or rhonchi  ABDOMEN: Soft, non-tender, non-distended EXTREMITIES:  No edema; No deformity   ASSESSMENT AND PLAN: .   HER2 positive breast cancer Cardio-Oncology CVD Risk: A1c in 2021 6.1 which is prediabetes.  Obesity.  Cardiotoxic Risk: Low Potential Cardiotoxic therapy: HER2.    Surveillance: Repeat limited echo in February 2024 Baseline BNP ***  She has reduced strain seen on her prior echocardiogram.  However her EF still looks preserved and she is asymptomatic.  Her blood pressure is**will plan to heart carvedilol ***as a cardioprotective agent.  Otherwise she can continue with her cancer therapy.    {Are you  ordering a CV Procedure (e.g. stress test, cath, DCCV, TEE, etc)?   Press F2        :[id number]}  Dispo: ***  Signed, Alvan Ronal BRAVO, MD

## 2023-06-01 ENCOUNTER — Telehealth: Payer: Self-pay | Admitting: Internal Medicine

## 2023-06-01 ENCOUNTER — Ambulatory Visit: Payer: Commercial Managed Care - PPO | Attending: Internal Medicine | Admitting: Internal Medicine

## 2023-06-01 ENCOUNTER — Other Ambulatory Visit: Payer: Self-pay | Admitting: *Deleted

## 2023-06-01 ENCOUNTER — Encounter: Payer: Self-pay | Admitting: Internal Medicine

## 2023-06-01 ENCOUNTER — Ambulatory Visit: Payer: Commercial Managed Care - PPO | Admitting: Internal Medicine

## 2023-06-01 VITALS — BP 130/80 | HR 99 | Ht 62.0 in | Wt 288.0 lb

## 2023-06-01 DIAGNOSIS — Z5181 Encounter for therapeutic drug level monitoring: Secondary | ICD-10-CM | POA: Diagnosis not present

## 2023-06-01 DIAGNOSIS — C50212 Malignant neoplasm of upper-inner quadrant of left female breast: Secondary | ICD-10-CM

## 2023-06-01 DIAGNOSIS — Z79899 Other long term (current) drug therapy: Secondary | ICD-10-CM | POA: Diagnosis not present

## 2023-06-01 DIAGNOSIS — I493 Ventricular premature depolarization: Secondary | ICD-10-CM | POA: Diagnosis not present

## 2023-06-01 DIAGNOSIS — E041 Nontoxic single thyroid nodule: Secondary | ICD-10-CM

## 2023-06-01 NOTE — Progress Notes (Signed)
  Cardiology Office Note:  .   Date:  06/01/2023  ID:  Diamond Marshall, DOB 21-Oct-1970, MRN [id number] PCP: Zollie Lowers, MD  [facility] Providers Cardiologist:  None    History of Present Illness: .   Diamond Marshall is a 53 y.o. 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  She follows with Dr. Loretha who referred her to cardiology because her echocardiogram showed concern for possible change in her LV function.  She also had abnormal strain with GLS of -15%.   She notes going to [place] [facility] recently with a heart racing up to 190s. She denies syncope.  She had no significant arrhythmia on arrival.  Her EKG shows sinus rhythm with PVCs, ECG met LVH criteria. Otherwise she denies any chest pressure, shortness of breath or lower extremity edema.  She has not had any recurrent episodes of SVT.  Today she comes in for follow-up appointment  ROS:  per HPI otherwise negative   Studies Reviewed: SABRA        TTE 06/29/2022 Baseline Normal LV function EF 60 to 65%, normal RV function, no significant valve disease.  However windows were poor.  TTE 09/29/2022 Echo was unchanged.  TTE 01/06/2023 Echo was unchanged.  TTE/20 06/2022 Her EF was noted to be 50 to 55% with a strain of -15%.  This was the first assessment of strain.  To me her EF still look normal   Risk Assessment/Calculations:        Physical Exam:   VS:  BP 130/80   Pulse 99   Ht 5' 2 (1.575 m)   Wt 288 lb (130.6 kg)   SpO2 97%   BMI 52.68 kg/m    Wt Readings from Last 3 Encounters:  06/01/23 288 lb (130.6 kg)  05/22/23 290 lb (131.5 kg)  05/18/23 295 lb 1.6 oz (133.9 kg)    GEN: Well nourished, well developed in no acute distress NECK: No JVD; No carotid bruits CARDIAC: RRR, no murmurs, rubs, gallops RESPIRATORY:  Clear to auscultation without rales, wheezing or rhonchi  ABDOMEN: Soft, non-tender,  non-distended EXTREMITIES:  No edema; No deformity   ASSESSMENT AND PLAN: .   HER2 positive breast cancer Cardio-Oncology CVD Risk: A1c was 5.7%. which is prediabetes.  Obesity.  She has lost a lot of weight with chemotherapy. Cardiotoxic Risk: Low Potential Cardiotoxic therapy: HER2.    Surveillance: Repeat limited echo in February 2025 while on anti-HER2 therapy Baseline BNP today Cardioprotective: cont coreg  3.125 mg BID. Did not tolerate lisinopril . Had hypotension with diarrhea.  She is planned for a mastectomy and she is acceptable cardiac risk for this procedure.  Overall she has reduced strain seen on her prior echocardiogram.  However her EF still looks preserved and she is asymptomatic.  Her blood pressure is well-controlled on the carvedilol .  Otherwise she can continue with her cancer therapy.       Dispo: Follow-up with an APP in 3 months  Signed, Draden Cottingham, Ronal BRAVO, MD

## 2023-06-01 NOTE — Telephone Encounter (Signed)
 Patient wants to be scheduled at [facility]. Not sure if the location of the order wouldn't let me schedule. If possible, update order with location to see if that changes it.

## 2023-06-01 NOTE — Patient Instructions (Addendum)
 Medication Instructions:  No changes  *If you need a refill on your cardiac medications before your next appointment, please call your pharmacy*   Lab Work: BNP If you have labs (blood work) drawn today and your tests are completely normal, you will receive your results only by: MyChart Message (if you have MyChart) OR A paper copy in the mail If you have any lab test that is abnormal or we need to change your treatment, we will call you to review the results.   Testing/Procedures: Echo to be scheduled in February  Your physician has requested that you have an echocardiogram. Echocardiography is a painless test that uses sound waves to create images of your heart. It provides your doctor with information about the size and shape of your heart and how well your heart's chambers and valves are working. This procedure takes approximately one hour. There are no restrictions for this procedure. Please do NOT wear cologne, perfume, aftershave, or lotions (deodorant is allowed). Please arrive 15 minutes prior to your appointment time.  Please note: We ask at that you not bring children with you during ultrasound (echo/ vascular) testing. Due to room size and safety concerns, children are not allowed in the ultrasound rooms during exams. Our front office staff cannot provide observation of children in our lobby area while testing is being conducted. An adult accompanying a patient to their appointment will only be allowed in the ultrasound room at the discretion of the ultrasound technician under special circumstances. We apologize for any inconvenience.   Follow-Up: At [facility], you and your health needs are our priority.  As part of our continuing mission to provide you with exceptional heart care, we have created designated Provider Care Teams.  These Care Teams include your primary Cardiologist (physician) and Advanced Practice Providers (APPs -  Physician Assistants and Nurse  Practitioners) who all work together to provide you with the care you need, when you need it.   Your next appointment:   3 month(s)  Provider:   Damien Braver   Other Instructions

## 2023-06-02 LAB — BRAIN NATRIURETIC PEPTIDE: BNP: 32.1 pg/mL (ref 0.0–100.0)

## 2023-06-04 NOTE — Telephone Encounter (Signed)
 Disregard

## 2023-06-05 ENCOUNTER — Other Ambulatory Visit: Payer: Self-pay | Admitting: Hematology and Oncology

## 2023-06-05 ENCOUNTER — Other Ambulatory Visit: Payer: Self-pay

## 2023-06-07 MED FILL — Fosaprepitant Dimeglumine For IV Infusion 150 MG (Base Eq): INTRAVENOUS | Qty: 5 | Status: AC

## 2023-06-08 ENCOUNTER — Inpatient Hospital Stay (HOSPITAL_BASED_OUTPATIENT_CLINIC_OR_DEPARTMENT_OTHER): Payer: Commercial Managed Care - PPO | Admitting: Hematology and Oncology

## 2023-06-08 ENCOUNTER — Encounter: Payer: Self-pay | Admitting: Hematology and Oncology

## 2023-06-08 ENCOUNTER — Inpatient Hospital Stay: Payer: Commercial Managed Care - PPO | Attending: Hematology and Oncology

## 2023-06-08 ENCOUNTER — Inpatient Hospital Stay: Payer: Commercial Managed Care - PPO

## 2023-06-08 ENCOUNTER — Ambulatory Visit (HOSPITAL_COMMUNITY)
Admission: RE | Admit: 2023-06-08 | Discharge: 2023-06-08 | Disposition: A | Discharge status: Home / Self Care | Payer: Commercial Managed Care - PPO | Source: Ambulatory Visit | Attending: Hematology and Oncology | Admitting: Hematology and Oncology

## 2023-06-08 VITALS — BP 138/57 | HR 93 | Temp 98.0°F | Resp 18 | Wt 295.8 lb

## 2023-06-08 DIAGNOSIS — E876 Hypokalemia: Secondary | ICD-10-CM | POA: Insufficient documentation

## 2023-06-08 DIAGNOSIS — D510 Vitamin B12 deficiency anemia due to intrinsic factor deficiency: Secondary | ICD-10-CM | POA: Insufficient documentation

## 2023-06-08 DIAGNOSIS — Z170 Estrogen receptor positive status [ER+]: Secondary | ICD-10-CM | POA: Insufficient documentation

## 2023-06-08 DIAGNOSIS — Z95828 Presence of other vascular implants and grafts: Secondary | ICD-10-CM

## 2023-06-08 DIAGNOSIS — Z803 Family history of malignant neoplasm of breast: Secondary | ICD-10-CM | POA: Diagnosis not present

## 2023-06-08 DIAGNOSIS — Z800 Family history of malignant neoplasm of digestive organs: Secondary | ICD-10-CM | POA: Diagnosis not present

## 2023-06-08 DIAGNOSIS — Z8041 Family history of malignant neoplasm of ovary: Secondary | ICD-10-CM | POA: Insufficient documentation

## 2023-06-08 DIAGNOSIS — C50212 Malignant neoplasm of upper-inner quadrant of left female breast: Secondary | ICD-10-CM | POA: Insufficient documentation

## 2023-06-08 DIAGNOSIS — E041 Nontoxic single thyroid nodule: Secondary | ICD-10-CM

## 2023-06-08 DIAGNOSIS — R197 Diarrhea, unspecified: Secondary | ICD-10-CM | POA: Insufficient documentation

## 2023-06-08 LAB — CMP (CANCER CENTER ONLY)
ALT: 19 U/L (ref 0–44)
AST: 27 U/L (ref 15–41)
Albumin: 3.2 g/dL — ABNORMAL LOW (ref 3.5–5.0)
Alkaline Phosphatase: 94 U/L (ref 38–126)
Anion gap: 4 — ABNORMAL LOW (ref 5–15)
BUN: 14 mg/dL (ref 6–20)
CO2: 28 mmol/L (ref 22–32)
Calcium: 8.6 mg/dL — ABNORMAL LOW (ref 8.9–10.3)
Chloride: 113 mmol/L — ABNORMAL HIGH (ref 98–111)
Creatinine: 0.68 mg/dL (ref 0.44–1.00)
GFR, Estimated: >60 mL/min (ref >60)
Glucose, Bld: 81 mg/dL (ref 70–99)
Potassium: 3.4 mmol/L — ABNORMAL LOW (ref 3.5–5.1)
Sodium: 145 mmol/L (ref 135–145)
Total Bilirubin: 0.5 mg/dL (ref 0.0–1.2)
Total Protein: 5.8 g/dL — ABNORMAL LOW (ref 6.5–8.1)

## 2023-06-08 LAB — CBC WITH DIFFERENTIAL (CANCER CENTER ONLY)
Abs Immature Granulocytes: 0.05 10*3/uL (ref 0.00–0.07)
Basophils Absolute: 0 10*3/uL (ref 0.0–0.1)
Basophils Relative: 1 %
Eosinophils Absolute: 0.2 10*3/uL (ref 0.0–0.5)
Eosinophils Relative: 3 %
HCT: 27.1 % — ABNORMAL LOW (ref 36.0–46.0)
Hemoglobin: 8.8 g/dL — ABNORMAL LOW (ref 12.0–15.0)
Immature Granulocytes: 1 %
Lymphocytes Relative: 49 %
Lymphs Abs: 2.8 10*3/uL (ref 0.7–4.0)
MCH: 31.7 pg (ref 26.0–34.0)
MCHC: 32.5 g/dL (ref 30.0–36.0)
MCV: 97.5 fL (ref 80.0–100.0)
Monocytes Absolute: 0.5 10*3/uL (ref 0.1–1.0)
Monocytes Relative: 9 %
Neutro Abs: 2.1 10*3/uL (ref 1.7–7.7)
Neutrophils Relative %: 37 %
Platelet Count: 439 10*3/uL — ABNORMAL HIGH (ref 150–400)
RBC: 2.78 MIL/uL — ABNORMAL LOW (ref 3.87–5.11)
RDW: 25.7 % — ABNORMAL HIGH (ref 11.5–15.5)
WBC Count: 5.7 10*3/uL (ref 4.0–10.5)
nRBC: 0.7 % — ABNORMAL HIGH (ref 0.0–0.2)

## 2023-06-08 MED ORDER — DEXTROSE 5 % IV SOLN
5.4000 mg/kg | Freq: Once | INTRAVENOUS | Status: AC
  Administered 2023-06-08: 800 mg via INTRAVENOUS
  Filled 2023-06-08: qty 40

## 2023-06-08 MED ORDER — SODIUM CHLORIDE 0.9% FLUSH
10.0000 mL | INTRAVENOUS | Status: DC | PRN
Start: 2023-06-08 — End: 2023-06-08
  Administered 2023-06-08: 10 mL

## 2023-06-08 MED ORDER — HEPARIN SOD (PORK) LOCK FLUSH 100 UNIT/ML IV SOLN
500.0000 [IU] | Freq: Once | INTRAVENOUS | Status: AC | PRN
  Administered 2023-06-08: 500 [IU]

## 2023-06-08 MED ORDER — DEXAMETHASONE SODIUM PHOSPHATE 10 MG/ML IJ SOLN
10.0000 mg | Freq: Once | INTRAMUSCULAR | Status: AC
  Administered 2023-06-08: 10 mg via INTRAVENOUS
  Filled 2023-06-08: qty 1

## 2023-06-08 MED ORDER — SODIUM CHLORIDE 0.9 % IV SOLN
150.0000 mg | Freq: Once | INTRAVENOUS | Status: AC
  Administered 2023-06-08: 150 mg via INTRAVENOUS
  Filled 2023-06-08: qty 150

## 2023-06-08 MED ORDER — PALONOSETRON HCL INJECTION 0.25 MG/5ML
0.2500 mg | Freq: Once | INTRAVENOUS | Status: AC
  Administered 2023-06-08: 0.25 mg via INTRAVENOUS
  Filled 2023-06-08: qty 5

## 2023-06-08 MED ORDER — SODIUM CHLORIDE 0.9% FLUSH
10.0000 mL | Freq: Once | INTRAVENOUS | Status: AC
  Administered 2023-06-08: 10 mL

## 2023-06-08 MED ORDER — DEXTROSE 5 % IV SOLN
Freq: Once | INTRAVENOUS | Status: AC
Start: 2023-06-08 — End: 2023-06-08

## 2023-06-08 MED ORDER — ACETAMINOPHEN 325 MG PO TABS
650.0000 mg | ORAL_TABLET | Freq: Once | ORAL | Status: AC
  Administered 2023-06-08: 650 mg via ORAL
  Filled 2023-06-08: qty 2

## 2023-06-08 MED ORDER — DIPHENHYDRAMINE HCL 25 MG PO CAPS
50.0000 mg | ORAL_CAPSULE | Freq: Once | ORAL | Status: AC
Start: 2023-06-08 — End: 2023-06-08
  Administered 2023-06-08: 50 mg via ORAL
  Filled 2023-06-08: qty 2

## 2023-06-08 NOTE — Patient Instructions (Signed)
 [facility]  Discharge Instructions: Thank you for choosing [facility] to provide your oncology and hematology care.   If you have a lab appointment with the Cancer Center, please go directly to the Cancer Center and check in at the registration area.   Wear comfortable clothing and clothing appropriate for easy access to any Portacath or PICC line.   We strive to give you quality time with your provider. You may need to reschedule your appointment if you arrive late (15 or more minutes).  Arriving late affects you and other patients whose appointments are after yours.  Also, if you miss three or more appointments without notifying the office, you may be dismissed from the clinic at the provider's discretion.      For prescription refill requests, have your pharmacy contact our office and allow 72 hours for refills to be completed.    Today you received the following chemotherapy and/or immunotherapy agents: Enhertu      To help prevent nausea and vomiting after your treatment, we encourage you to take your nausea medication as directed.  BELOW ARE SYMPTOMS THAT SHOULD BE REPORTED IMMEDIATELY: *FEVER GREATER THAN 100.4 F (38 C) OR HIGHER *CHILLS OR SWEATING *NAUSEA AND VOMITING THAT IS NOT CONTROLLED WITH YOUR NAUSEA MEDICATION *UNUSUAL SHORTNESS OF BREATH *UNUSUAL BRUISING OR BLEEDING *URINARY PROBLEMS (pain or burning when urinating, or frequent urination) *BOWEL PROBLEMS (unusual diarrhea, constipation, pain near the anus) TENDERNESS IN MOUTH AND THROAT WITH OR WITHOUT PRESENCE OF ULCERS (sore throat, sores in mouth, or a toothache) UNUSUAL RASH, SWELLING OR PAIN  UNUSUAL VAGINAL DISCHARGE OR ITCHING   Items with * indicate a potential emergency and should be followed up as soon as possible or go to the Emergency Department if any problems should occur.  Please show the CHEMOTHERAPY ALERT CARD or IMMUNOTHERAPY  ALERT CARD at check-in to the Emergency Department and triage nurse.  Should you have questions after your visit or need to cancel or reschedule your appointment, please contact [facility]  Dept: [phone]  and follow the prompts.  Office hours are 8:00 a.m. to 4:30 p.m. Monday - Friday. Please note that voicemails left after 4:00 p.m. may not be returned until the following business day.  We are closed weekends and major holidays. You have access to a nurse at all times for urgent questions. Please call the main number to the clinic Dept: [phone] and follow the prompts.   For any non-urgent questions, you may also contact your provider using MyChart. We now offer e-Visits for anyone 70 and older to request care online for non-urgent symptoms. For details visit [email or web address].   Also download the MyChart app! Go to the app store, search "MyChart", open the app, select [place], and log in with your MyChart username and password.

## 2023-06-08 NOTE — Progress Notes (Signed)
Patient Care Team: Mechele Claude, MD as PCP - General (Family Medicine) Harriette Bouillon, MD as Consulting Physician (General Surgery) Rachel Moulds, MD as Consulting Physician (Hematology and Oncology) Lonie Peak, MD as Attending Physician (Radiation Oncology) Pershing Proud, RN as Oncology Nurse Navigator Donnelly Angelica, RN as Oncology Nurse Navigator  DIAGNOSIS:  No diagnosis found.   SUMMARY OF ONCOLOGIC HISTORY: Oncology History  Malignant neoplasm of upper-inner quadrant of left breast in female, estrogen receptor positive (HCC)  06/06/2022 Mammogram   Diagnostic mammogram given palpable mass showed suspicious spiculated left breast mass with surrounding nodularity at 9 o'clock position.  This measures up to 3.1 cm sonographically however due to deep location and patient body habitus the mammographic measurement of 5.8 cm is felt to be more accurate.  At least 2 abnormal lymph nodes in the left axilla.  Possible abnormal palpable left supraclavicular lymph node.   06/06/2022 Breast US   Breast ultrasound confirmed these findings.   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  Prognostic showed ER 65% weak to strong staining, PR 15% weak to moderate staining, Ki-67 of 50% and HER2 positive by IHC at 3+    Genetic Testing   [facility] Custom Panel+RNA was Negative. Report date is 06/28/2022.   The Custom Hereditary Cancers Panel offered by [facility] includes sequencing and/or deletion duplication testing of the following 43 genes: APC, ATM, AXIN2, BAP1, BARD1, BMPR1A, BRCA1, BRCA2, BRIP1, CDH1, CDK4, CDKN2A (p14ARF and p16INK4a only), CHEK2, CTNNA1, EPCAM (Deletion/duplication testing only), FH, GREM1 (promoter region duplication testing only), HOXB13, KIT, MBD4, MEN1, MLH1, MSH2, MSH3, MSH6, MUTYH, NF1, NHTL1,  PALB2, PDGFRA, PMS2, POLD1, POLE, PTEN, RAD51C, RAD51D, SMAD4, SMARCA4. STK11, TP53, TSC1, TSC2, and VHL.    06/29/2022 PET scan   IMPRESSION: 1. Hypermetabolic left breast mass, compatible with primary breast malignancy. 2. Hypermetabolic left cervical, axillary, subpectoral, supraclavicular and prevascular mediastinal lymph nodes, compatible with nodal metastatic disease. 3. No evidence of metastatic disease in the abdomen or pelvis. 4. Aortic Atherosclerosis (ICD10-I70.0).     Electronically Signed   By: Allegra Lai M.D.   On: 06/29/2022 09:32   07/11/2022 - 01/16/2023 Chemotherapy   Patient is on Treatment Plan : BREAST DOCEtaxel + Trastuzumab + Pertuzumab (THP) q21d x 8 cycles / Trastuzumab + Pertuzumab q21d x 4 cycles     02/01/2023 -  Chemotherapy   Patient is on Treatment Plan : BREAST METASTATIC Fam-Trastuzumab Deruxtecan-nxki (Enhertu) (5.4) q21d       CHIEF COMPLIANT: Cycle 6 Enhertu  HISTORY OF PRESENT ILLNESS:   Discussed the use of AI scribe software for clinical note transcription with the patient, who gave verbal consent to proceed.  History of Present Illness    The patient, with a history of breast cancer, presents for a follow-up visit. She reports a sensation of a lump in her throat, which has been bothersome. The patient describes the sensation as similar to having a pill stuck in her throat. This symptom started in early December. She has a history of thyroid disease and was previously on thyroid medication. She also has a family history of thyroid disease  The patient also reports a heart issue that occurred a couple of weeks ago. She woke up in the middle of the night with her heart pounding out of her chest. She went to the ER, where  she was monitored for eight hours. All tests came back normal, and she was released. She has since seen a cardiologist, who found everything to be fine.  The patient also mentions that she has been experiencing diarrhea,  which has been managed with medication. She is currently on budesonide, which has been effective. She also takes B12 shots every other weekly due to pernicious anemia.  ALLERGIES:  is allergic to misc. sulfonamide containing compounds, phentermine, bee venom, peanut allergen powder-dnfp, topiramate, septra [sulfamethoxazole-trimethoprim], and sulfa antibiotics.  MEDICATIONS:  Current Outpatient Medications  Medication Sig Dispense Refill   budesonide (ENTOCORT EC) 3 MG 24 hr capsule Take 3 capsules (9 mg total) by mouth daily. 90 capsule 1   carvedilol (COREG) 3.125 MG tablet TAKE ONE TABLET TWICE DAILY WITH MEAL(S) 60 tablet 0   cholestyramine (QUESTRAN) 4 g packet Take by mouth.     colestipol (COLESTID) 5 g granules Take 5 g by mouth 2 (two) times daily. 500 g 12   cyanocobalamin (VITAMIN B12) 1000 MCG/ML injection INJECT 1 ML INTRAMUSCULARLY EVERY 15 DAYS 30 mL 3   diclofenac (VOLTAREN) 75 MG EC tablet TAKE ONE TABLET TWICE DAILY AS NEEDED. needs appointment FOR refills (Patient not taking: Reported on 06/01/2023) 180 tablet 1   diphenoxylate-atropine (LOMOTIL) 2.5-0.025 MG tablet TAKE TWO TABLETS EVERY 6 HOURS AS NEEDED FOR DIARRHEA 60 tablet 1   EPINEPHrine 0.3 mg/0.3 mL IJ SOAJ injection Inject 0.3 mg into the muscle as needed for anaphylaxis. 2 each 0   escitalopram (LEXAPRO) 20 MG tablet Take 1 tablet (20 mg total) by mouth daily. 90 tablet 3   hydrocortisone (ANUSOL-HC) 2.5 % rectal cream Place 1 Application rectally 2 (two) times daily. (Patient not taking: Reported on 06/01/2023) 30 g 2   lidocaine (XYLOCAINE) 2 % jelly 1 Application by Other route as needed. (Patient not taking: Reported on 06/01/2023) 30 mL 2   lisinopril (ZESTRIL) 20 MG tablet Take 1 tablet (20 mg total) by mouth daily. (Patient not taking: Reported on 06/01/2023) 90 tablet 1   LORazepam (ATIVAN) 0.5 MG tablet Take 1 tablet (0.5 mg total) by mouth every 8 (eight) hours as needed for anxiety. 30 tablet 1   pantoprazole  (PROTONIX) 40 MG tablet Take 1 tablet (40 mg total) by mouth daily. 90 tablet 3   potassium chloride (KLOR-CON) 10 MEQ tablet TAKE TWO TABLETS DAILY AS DIRECTED 60 tablet 0   prochlorperazine (COMPAZINE) 10 MG tablet Take 10 mg by mouth every 6 (six) hours as needed.     promethazine (PHENERGAN) 25 MG tablet Take 1 tablet (25 mg total) by mouth every 6 (six) hours as needed for nausea or vomiting. 30 tablet 0   rifaximin (XIFAXAN) 200 MG tablet Take 1 tablet (200 mg total) by mouth 3 (three) times daily. For chemo related diarrhea 90 tablet 5   No current facility-administered medications for this visit.    PHYSICAL EXAMINATION: ECOG PERFORMANCE STATUS: 1 - Symptomatic but completely ambulatory  Vitals:   06/08/23 1159  BP: (!) 138/57  Pulse: 93  Resp: 18  Temp: 98 F (36.7 C)  SpO2: 99%   Filed Weights   06/08/23 1159  Weight: 295 lb 12.8 oz (134.2 kg)     NECK: No abnormalities noted upon palpation. CHEST: Breath sounds clear upon auscultation. ABDOMEN: No tenderness upon palpation. No LE edema  LABORATORY DATA:  I have reviewed the data as listed    Latest Ref Rng & Units 06/08/2023   11:05 AM 05/22/2023  4:12 AM 05/18/2023    9:55 AM  CMP  Glucose 70 - 99 mg/dL 81  295  88   BUN 6 - 20 mg/dL 14  20  11    Creatinine 0.44 - 1.00 mg/dL 6.21  3.08  6.57   Sodium 135 - 145 mmol/L 145  137  145   Potassium 3.5 - 5.1 mmol/L 3.4  3.6  3.5   Chloride 98 - 111 mmol/L 113  107  111   CO2 22 - 32 mmol/L 28  22  30    Calcium 8.9 - 10.3 mg/dL 8.6  8.1  8.6   Total Protein 6.5 - 8.1 g/dL 5.8   6.0   Total Bilirubin 0.0 - 1.2 mg/dL 0.5   0.5   Alkaline Phos 38 - 126 U/L 94   93   AST 15 - 41 U/L 27   22   ALT 0 - 44 U/L 19   17     Lab Results  Component Value Date   WBC 5.7 06/08/2023   HGB 8.8 (L) 06/08/2023   HCT 27.1 (L) 06/08/2023   MCV 97.5 06/08/2023   PLT 439 (H) 06/08/2023   NEUTROABS 2.1 06/08/2023    ASSESSMENT & PLAN:  Malignant neoplasm of  upper-inner quadrant of left breast in female, 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  (Left breast mass, left cervical, axillary, subpectoral, supraclavicular and prevascular mediastinal lymph nodes, no metastasis in the abdomen)  07/11/2022-01/16/2023: THP followed by HP maintenance Started Enhertu second line 02/01/2023: Enhertu cycle 7 today  Breast Cancer Progressed while on Herceptin Perjeta. Plan to continue Enhertu for three weeks before surgery. Post-surgery, considering switching to kadcyla maintenance -Continue Enhertu as scheduled.  Thyroid Nodule Patient reports feeling a lump in the throat. History of thyroid medication use. Ultrasound scheduled for today. -Perform ultrasound today. -Review ultrasound report and discuss findings with patient.  Pernicious Anemia Patient self-administering B12 shots twice a month. Unclear if this frequency is adequate. -Check B12 levels at next lab visit. -Adjust B12 shot frequency based on lab results.  Hypokalemia Slightly low potassium levels despite patient taking potassium pills. -Continue potassium pills. -Increase dietary potassium intake (e.g., bananas).  Diarrhea Controlled with budesonide and Imodium. -Continue budesonide and Imodium as needed.  Cardiac Event Patient experienced a rapid heart rate episode, evaluated in ER with no significant findings. Cardiologist cleared patient for treatment. -Continue to monitor cardiac status.   Follow-up RTC as scheduled.  No orders of the defined types were placed in this encounter.  The patient has a good understanding of the overall plan. she agrees with it. she will call with any problems that may develop before the next visit here. Total time spent: 30 mins including face to face time and time spent for planning, charting and co-ordination of care   Rachel Moulds, MD 06/08/23

## 2023-06-10 ENCOUNTER — Encounter: Payer: Self-pay | Admitting: Hematology and Oncology

## 2023-06-11 ENCOUNTER — Encounter: Payer: Self-pay | Admitting: *Deleted

## 2023-06-11 ENCOUNTER — Other Ambulatory Visit: Payer: Self-pay | Admitting: Hematology and Oncology

## 2023-06-11 DIAGNOSIS — E041 Nontoxic single thyroid nodule: Secondary | ICD-10-CM

## 2023-06-11 NOTE — Progress Notes (Signed)
Thyroid biopsy ordered.  Diamond Marshall

## 2023-06-11 NOTE — Progress Notes (Unsigned)
Narrative & Impression  CLINICAL DATA:  Incidental on CT. History of metastatic breast cancer. Right-sided thyroid nodule questioned on chest CT performed 05/12/2023   EXAM: THYROID ULTRASOUND   TECHNIQUE: Ultrasound examination of the thyroid gland and adjacent soft tissues was performed.   COMPARISON:  Chest CT-05/12/2023; PET-CT-06/29/2022   FINDINGS: Examination is degraded due to patient body habitus and poor sonographic window.   Parenchymal Echotexture: Mildly heterogenous   Isthmus: Normal in size measuring 0.5 cm in diameter   Right lobe: Normal in size measuring 4.2 x 2.0 x 1.9 cm   Left lobe: Slightly atrophic measuring 3.1 x 1.51.3 cm   _________________________________________________________   Estimated total number of nodules >/= 1 cm: 1   Number of spongiform nodules >/=  2 cm not described below (TR1): 0   Number of mixed cystic and solid nodules >/= 1.5 cm not described below (TR2): 0   _________________________________________________________   Nodule # 1:   Location: Right; Mid - correlates with the nodule questioned on preceding chest CT   Maximum size: 1.9 cm; Other 2 dimensions: 1.6 x 1.2 cm   Composition: solid/almost completely solid (2)   Echogenicity: hypoechoic (2)   Shape: taller-than-wide (3)   Margins: lobulated/irregular (2)   Echogenic foci: punctate echogenic foci (3)   ACR TI-RADS total points: 12.   ACR TI-RADS risk category: TR5 (>/= 7 points).   ACR TI-RADS recommendations:   **Given size (>/= 1.0 cm) and appearance, fine needle aspiration of this highly suspicious nodule should be considered based on TI-RADS criteria.   _________________________________________________________   No regional cervical lymphadenopathy.   IMPRESSION: 1. Mildly heterogeneous and slightly atrophic thyroid. 2. Solitary right-sided 1.9 cm TR5 nodule, correlating with the nodule questioned on preceding chest CT, meets imaging  criteria to recommend percutaneous sampling as indicated.   The above is in keeping with the ACR TI-RADS recommendations - J Am Coll Radiol 2017;14:587-595.     Electronically Signed   By: Simonne Come M.D.   On: 06/09/2023 14:56

## 2023-06-13 ENCOUNTER — Other Ambulatory Visit: Payer: Self-pay | Admitting: Hematology and Oncology

## 2023-06-13 ENCOUNTER — Other Ambulatory Visit: Payer: Self-pay | Admitting: Adult Health

## 2023-06-13 DIAGNOSIS — E876 Hypokalemia: Secondary | ICD-10-CM

## 2023-06-14 ENCOUNTER — Encounter: Payer: Self-pay | Admitting: Hematology and Oncology

## 2023-06-15 ENCOUNTER — Other Ambulatory Visit: Payer: Self-pay | Admitting: *Deleted

## 2023-06-15 ENCOUNTER — Telehealth: Payer: Self-pay

## 2023-06-15 MED ORDER — CYCLOSPORINE 0.05 % OP EMUL: 1.0000 [drp] | Freq: Two times a day (BID) | OPHTHALMIC | 1 refills | Status: AC

## 2023-06-15 NOTE — Telephone Encounter (Signed)
Notified Patient that Restasis 0.05% Ophthalmic Emulsion is not covered under insurance. Provider notified of alternative medication. Patient notified that alternative medication will be sent to her preferred pharmacy. No other needs or concerns noted at this time.

## 2023-06-15 NOTE — Telephone Encounter (Signed)
Notified Patient of prior authorization approval for Restasis. Medication is approved through 06/14/2024. Pharmacy notified. No other needs or concerns noted at this time.

## 2023-06-19 ENCOUNTER — Encounter: Payer: Self-pay | Admitting: Hematology and Oncology

## 2023-06-22 ENCOUNTER — Encounter: Payer: Self-pay | Admitting: Hematology and Oncology

## 2023-06-22 ENCOUNTER — Telehealth: Payer: Self-pay | Admitting: *Deleted

## 2023-06-22 NOTE — Telephone Encounter (Signed)
This RN spoke to pt per her VM stating onset of stomach pain.  Per discussion pain is in her stomach not bowels.  She states she is noticing she has been burping a lot " and my coworker said maybe I have gas" - she   She does state she had ice cream last night which she has not had "for months".  She is not having diarrhea at present.  Per discussion this RN advised her above may be relating to the lactose in the ice cream.  She has not taken any meds for relief.  Recommendation given for pt to take Gas X , pepcid and benadryl for benefit.

## 2023-06-25 ENCOUNTER — Encounter: Payer: Self-pay | Admitting: Hematology and Oncology

## 2023-06-27 ENCOUNTER — Ambulatory Visit (HOSPITAL_COMMUNITY)
Admission: RE | Admit: 2023-06-27 | Discharge: 2023-06-27 | Disposition: A | Discharge status: Home / Self Care | Payer: Commercial Managed Care - PPO | Source: Ambulatory Visit | Attending: Family Medicine | Admitting: Family Medicine

## 2023-06-27 DIAGNOSIS — Z79899 Other long term (current) drug therapy: Secondary | ICD-10-CM | POA: Insufficient documentation

## 2023-06-27 DIAGNOSIS — Z5181 Encounter for therapeutic drug level monitoring: Secondary | ICD-10-CM | POA: Insufficient documentation

## 2023-06-27 LAB — ECHOCARDIOGRAM LIMITED
Calc EF: 59.3 %
S' Lateral: 3.2 cm
Single Plane A2C EF: 59.7 %
Single Plane A4C EF: 60.1 %

## 2023-06-27 NOTE — Progress Notes (Signed)
*  PRELIMINARY RESULTS* Echocardiogram Limited 2-D Echocardiogram  has been performed.  [name] 06/27/2023, 9:32 AM

## 2023-06-29 ENCOUNTER — Inpatient Hospital Stay: Payer: Commercial Managed Care - PPO

## 2023-06-29 ENCOUNTER — Encounter: Payer: Self-pay | Admitting: Hematology and Oncology

## 2023-06-29 ENCOUNTER — Inpatient Hospital Stay: Payer: Commercial Managed Care - PPO | Attending: Hematology and Oncology

## 2023-06-29 ENCOUNTER — Inpatient Hospital Stay (HOSPITAL_BASED_OUTPATIENT_CLINIC_OR_DEPARTMENT_OTHER): Payer: Commercial Managed Care - PPO | Attending: Hematology and Oncology | Admitting: Hematology and Oncology

## 2023-06-29 VITALS — BP 129/74 | HR 76 | Temp 98.7°F | Resp 18

## 2023-06-29 VITALS — BP 134/51 | HR 86 | Temp 97.6°F | Resp 18 | Wt 289.6 lb

## 2023-06-29 DIAGNOSIS — Z803 Family history of malignant neoplasm of breast: Secondary | ICD-10-CM | POA: Diagnosis not present

## 2023-06-29 DIAGNOSIS — Z79899 Other long term (current) drug therapy: Secondary | ICD-10-CM | POA: Diagnosis not present

## 2023-06-29 DIAGNOSIS — Z800 Family history of malignant neoplasm of digestive organs: Secondary | ICD-10-CM | POA: Insufficient documentation

## 2023-06-29 DIAGNOSIS — Z5112 Encounter for antineoplastic immunotherapy: Secondary | ICD-10-CM | POA: Diagnosis present

## 2023-06-29 DIAGNOSIS — Z95828 Presence of other vascular implants and grafts: Secondary | ICD-10-CM

## 2023-06-29 DIAGNOSIS — Z170 Estrogen receptor positive status [ER+]: Secondary | ICD-10-CM | POA: Insufficient documentation

## 2023-06-29 DIAGNOSIS — E739 Lactose intolerance, unspecified: Secondary | ICD-10-CM | POA: Diagnosis not present

## 2023-06-29 DIAGNOSIS — D510 Vitamin B12 deficiency anemia due to intrinsic factor deficiency: Secondary | ICD-10-CM | POA: Insufficient documentation

## 2023-06-29 DIAGNOSIS — E041 Nontoxic single thyroid nodule: Secondary | ICD-10-CM | POA: Diagnosis not present

## 2023-06-29 DIAGNOSIS — C50212 Malignant neoplasm of upper-inner quadrant of left female breast: Secondary | ICD-10-CM | POA: Insufficient documentation

## 2023-06-29 DIAGNOSIS — Z8041 Family history of malignant neoplasm of ovary: Secondary | ICD-10-CM | POA: Diagnosis not present

## 2023-06-29 LAB — CBC WITH DIFFERENTIAL (CANCER CENTER ONLY)
Abs Immature Granulocytes: 0.06 10*3/uL (ref 0.00–0.07)
Basophils Absolute: 0.1 10*3/uL (ref 0.0–0.1)
Basophils Relative: 1 %
Eosinophils Absolute: 0.3 10*3/uL (ref 0.0–0.5)
Eosinophils Relative: 4 %
HCT: 26.5 % — ABNORMAL LOW (ref 36.0–46.0)
Hemoglobin: 8.7 g/dL — ABNORMAL LOW (ref 12.0–15.0)
Immature Granulocytes: 1 %
Lymphocytes Relative: 37 %
Lymphs Abs: 2.5 10*3/uL (ref 0.7–4.0)
MCH: 33.1 pg (ref 26.0–34.0)
MCHC: 32.8 g/dL (ref 30.0–36.0)
MCV: 100.8 fL — ABNORMAL HIGH (ref 80.0–100.0)
Monocytes Absolute: 0.6 10*3/uL (ref 0.1–1.0)
Monocytes Relative: 8 %
Neutro Abs: 3.3 10*3/uL (ref 1.7–7.7)
Neutrophils Relative %: 49 %
Platelet Count: 408 10*3/uL — ABNORMAL HIGH (ref 150–400)
RBC: 2.63 MIL/uL — ABNORMAL LOW (ref 3.87–5.11)
RDW: 23 % — ABNORMAL HIGH (ref 11.5–15.5)
WBC Count: 6.8 10*3/uL (ref 4.0–10.5)
nRBC: 0.4 % — ABNORMAL HIGH (ref 0.0–0.2)

## 2023-06-29 LAB — CMP (CANCER CENTER ONLY)
ALT: 21 U/L (ref 0–44)
AST: 29 U/L (ref 15–41)
Albumin: 3.3 g/dL — ABNORMAL LOW (ref 3.5–5.0)
Alkaline Phosphatase: 93 U/L (ref 38–126)
Anion gap: 4 — ABNORMAL LOW (ref 5–15)
BUN: 8 mg/dL (ref 6–20)
CO2: 30 mmol/L (ref 22–32)
Calcium: 8.7 mg/dL — ABNORMAL LOW (ref 8.9–10.3)
Chloride: 110 mmol/L (ref 98–111)
Creatinine: 0.7 mg/dL (ref 0.44–1.00)
GFR, Estimated: >60 mL/min (ref >60)
Glucose, Bld: 95 mg/dL (ref 70–99)
Potassium: 3.7 mmol/L (ref 3.5–5.1)
Sodium: 144 mmol/L (ref 135–145)
Total Bilirubin: 0.6 mg/dL (ref 0.0–1.2)
Total Protein: 5.9 g/dL — ABNORMAL LOW (ref 6.5–8.1)

## 2023-06-29 LAB — VITAMIN B12: Vitamin B-12: 675 pg/mL (ref 180–914)

## 2023-06-29 MED ORDER — DIPHENHYDRAMINE HCL 25 MG PO CAPS
50.0000 mg | ORAL_CAPSULE | Freq: Once | ORAL | Status: AC
  Administered 2023-06-29: 50 mg via ORAL
  Filled 2023-06-29: qty 2

## 2023-06-29 MED ORDER — SODIUM CHLORIDE 0.9 % IV SOLN
150.0000 mg | Freq: Once | INTRAVENOUS | Status: AC
  Administered 2023-06-29: 150 mg via INTRAVENOUS
  Filled 2023-06-29: qty 150

## 2023-06-29 MED ORDER — SODIUM CHLORIDE 0.9% FLUSH
10.0000 mL | INTRAVENOUS | Status: DC | PRN
  Administered 2023-06-29: 10 mL

## 2023-06-29 MED ORDER — PALONOSETRON HCL INJECTION 0.25 MG/5ML
0.2500 mg | Freq: Once | INTRAVENOUS | Status: AC
  Administered 2023-06-29: 0.25 mg via INTRAVENOUS
  Filled 2023-06-29: qty 5

## 2023-06-29 MED ORDER — DEXAMETHASONE SODIUM PHOSPHATE 10 MG/ML IJ SOLN
10.0000 mg | Freq: Once | INTRAMUSCULAR | Status: AC
  Administered 2023-06-29: 10 mg via INTRAVENOUS
  Filled 2023-06-29: qty 1

## 2023-06-29 MED ORDER — HEPARIN SOD (PORK) LOCK FLUSH 100 UNIT/ML IV SOLN
500.0000 [IU] | Freq: Once | INTRAVENOUS | Status: AC | PRN
  Administered 2023-06-29: 500 [IU]

## 2023-06-29 MED ORDER — SODIUM CHLORIDE 0.9% FLUSH
10.0000 mL | Freq: Once | INTRAVENOUS | Status: AC
  Administered 2023-06-29: 10 mL

## 2023-06-29 MED ORDER — FAM-TRASTUZUMAB DERUXTECAN-NXKI CHEMO 100 MG IV SOLR
5.4000 mg/kg | Freq: Once | INTRAVENOUS | Status: AC
  Administered 2023-06-29: 800 mg via INTRAVENOUS
  Filled 2023-06-29: qty 40

## 2023-06-29 MED ORDER — OSELTAMIVIR PHOSPHATE 75 MG PO CAPS: 75.0000 mg | ORAL_CAPSULE | Freq: Two times a day (BID) | ORAL | 0 refills | Status: DC

## 2023-06-29 MED ORDER — DEXTROSE 5 % IV SOLN: Freq: Once | INTRAVENOUS | Status: AC

## 2023-06-29 MED ORDER — ACETAMINOPHEN 325 MG PO TABS
650.0000 mg | ORAL_TABLET | Freq: Once | ORAL | Status: AC
  Administered 2023-06-29: 650 mg via ORAL
  Filled 2023-06-29: qty 2

## 2023-06-29 NOTE — Patient Instructions (Signed)

## 2023-06-29 NOTE — Progress Notes (Signed)
 Patient Care Team: Zollie Lowers, MD as PCP - General (Family Medicine) [name], MD as Consulting Physician (General Surgery) [name], MD as Consulting Physician (Hematology and Oncology) Izell Domino, MD as Attending Physician (Radiation Oncology) Glean Stephane BROCKS, RN as Oncology Nurse Navigator Tyree Nanetta SAILOR, RN as Oncology Nurse Navigator   SUMMARY OF ONCOLOGIC HISTORY: Oncology History  Malignant neoplasm of upper-inner quadrant of left breast in female, estrogen receptor positive (HCC)  06/06/2022 Mammogram   Diagnostic mammogram given palpable mass showed suspicious spiculated left breast mass with surrounding nodularity at 9 o'clock position.  This measures up to 3.1 cm sonographically however due to deep location and patient body habitus the mammographic measurement of 5.8 cm is felt to be more accurate.  At least 2 abnormal lymph nodes in the left axilla.  Possible abnormal palpable left supraclavicular lymph node.   06/06/2022 Breast US    Breast ultrasound confirmed these findings.   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  Prognostic showed ER 65% weak to strong staining, PR 15% weak to moderate staining, Ki-67 of 50% and HER2 positive by IHC at 3+    Genetic Testing   [employer] Custom Panel+RNA was Negative. Report date is 06/28/2022.   The Custom Hereditary Cancers Panel offered by [facility] includes sequencing and/or deletion duplication testing of the following 43 genes: APC, ATM, AXIN2, BAP1, BARD1, BMPR1A, BRCA1, BRCA2, BRIP1, CDH1, CDK4, CDKN2A (p14ARF and p16INK4a only), CHEK2, CTNNA1, EPCAM (Deletion/duplication testing only), FH, GREM1 (promoter region duplication testing only), HOXB13, KIT, MBD4, MEN1, MLH1, MSH2, MSH3, MSH6, MUTYH, NF1, NHTL1, PALB2, PDGFRA, PMS2, POLD1, POLE, PTEN,  RAD51C, RAD51D, SMAD4, SMARCA4. STK11, TP53, TSC1, TSC2, and VHL.    06/29/2022 PET scan   IMPRESSION: 1. Hypermetabolic left breast mass, compatible with primary breast malignancy. 2. Hypermetabolic left cervical, axillary, subpectoral, supraclavicular and prevascular mediastinal lymph nodes, compatible with nodal metastatic disease. 3. No evidence of metastatic disease in the abdomen or pelvis. 4. Aortic Atherosclerosis (ICD10-I70.0).     Electronically Signed   By: [name] M.D.   On: 06/29/2022 09:32   07/11/2022 - 01/16/2023 Chemotherapy   Patient is on Treatment Plan : BREAST DOCEtaxel  + Trastuzumab  + Pertuzumab  (THP) q21d x 8 cycles / Trastuzumab  + Pertuzumab  q21d x 4 cycles     02/01/2023 -  Chemotherapy   Patient is on Treatment Plan : BREAST METASTATIC Fam-Trastuzumab Deruxtecan-nxki  (Enhertu ) (5.4) q21d       CHIEF COMPLIANT: Cycle 6 Enhertu   HISTORY OF PRESENT ILLNESS:   Discussed the use of AI scribe software for clinical note transcription with the patient, who gave verbal consent to proceed.  History of Present Illness    The patient, with a history of breast cancer, presents for a follow-up visit prior to Enhertu .  Discussed the use of AI scribe software for clinical note transcription with the patient, who gave verbal consent to proceed.  History of Present Illness    Diamond Marshall is a 53 year old female with breast cancer who presents for chemotherapy treatment and pre-surgical evaluation.  She has a history of thyroid  issues and has a biopsy scheduled for the upcoming Tuesday. Her symptoms, including difficulty swallowing, have improved, and the thyroid  nodule appears smaller.  She has experienced severe stomach pains attributed to lactose intolerance. By significantly reducing her dairy intake, she has  alleviated her stomach pain and diarrhea.  Her daughter, a runner, broadcasting/film/video, has been avoiding her due to a flu outbreak at her school. She is taking  precautions such as using hand sanitizer and wearing a mask. She is wondering about tamiflu  for post exposure prophylaxis.  She is scheduled for surgery early March.  ALLERGIES:  is allergic to misc. sulfonamide containing compounds, phentermine , bee venom, peanut allergen powder-dnfp, topiramate , septra [sulfamethoxazole-trimethoprim], and sulfa antibiotics.  MEDICATIONS:  Current Outpatient Medications  Medication Sig Dispense Refill   oseltamivir  (TAMIFLU ) 75 MG capsule Take 1 capsule (75 mg total) by mouth 2 (two) times daily. 10 capsule 0   budesonide  (ENTOCORT EC ) 3 MG 24 hr capsule TAKE THREE CAPSULES BY MOUTH DAILY 90 capsule 1   carvedilol  (COREG ) 3.125 MG tablet TAKE ONE TABLET TWICE DAILY WITH MEAL(S) 60 tablet 0   cholestyramine  (QUESTRAN ) 4 g packet Take by mouth.     colestipol  (COLESTID ) 5 g granules Take 5 g by mouth 2 (two) times daily. 500 g 12   cyanocobalamin  (VITAMIN B12) 1000 MCG/ML injection INJECT 1 ML INTRAMUSCULARLY EVERY 15 DAYS 30 mL 3   cycloSPORINE  (RESTASIS ) 0.05 % ophthalmic emulsion Place 1 drop into both eyes 2 (two) times daily. 5.5 mL 1   diclofenac  (VOLTAREN ) 75 MG EC tablet TAKE ONE TABLET TWICE DAILY AS NEEDED. needs appointment FOR refills (Patient not taking: Reported on 06/01/2023) 180 tablet 1   diphenoxylate -atropine  (LOMOTIL ) 2.5-0.025 MG tablet TAKE TWO TABLETS EVERY 6 HOURS AS NEEDED FOR DIARRHEA 60 tablet 1   EPINEPHrine  0.3 mg/0.3 mL IJ SOAJ injection Inject 0.3 mg into the muscle as needed for anaphylaxis. 2 each 0   escitalopram  (LEXAPRO ) 20 MG tablet Take 1 tablet (20 mg total) by mouth daily. 90 tablet 3   hydrocortisone  (ANUSOL -HC) 2.5 % rectal cream Place 1 Application rectally 2 (two) times daily. (Patient not taking: Reported on 06/01/2023) 30 g 2   lidocaine  (XYLOCAINE ) 2 % jelly 1 Application by Other route as needed. (Patient not taking: Reported on 06/01/2023) 30 mL 2   lisinopril  (ZESTRIL ) 20 MG tablet Take 1 tablet (20 mg total) by  mouth daily. (Patient not taking: Reported on 06/01/2023) 90 tablet 1   LORazepam  (ATIVAN ) 0.5 MG tablet Take 1 tablet (0.5 mg total) by mouth every 8 (eight) hours as needed for anxiety. 30 tablet 1   pantoprazole  (PROTONIX ) 40 MG tablet Take 1 tablet (40 mg total) by mouth daily. 90 tablet 3   potassium chloride  (KLOR-CON ) 10 MEQ tablet TAKE TWO TABLETS DAILY AS DIRECTED 60 tablet 0   prochlorperazine  (COMPAZINE ) 10 MG tablet Take 10 mg by mouth every 6 (six) hours as needed.     promethazine  (PHENERGAN ) 25 MG tablet Take 1 tablet (25 mg total) by mouth every 6 (six) hours as needed for nausea or vomiting. 30 tablet 0   rifaximin  (XIFAXAN ) 200 MG tablet Take 1 tablet (200 mg total) by mouth 3 (three) times daily. For chemo related diarrhea 90 tablet 5   No current facility-administered medications for this visit.    PHYSICAL EXAMINATION: ECOG PERFORMANCE STATUS: 1 - Symptomatic but completely ambulatory  Vitals:   06/29/23 1147  BP: (!) 134/51  Pulse: 86  Resp: 18  Temp: 97.6 F (36.4 C)  SpO2: 100%   Filed Weights   06/29/23 1147  Weight: 289 lb 9.6 oz (131.4 kg)     NECK: No abnormalities noted upon palpation. Breast: Bilateral breast inspected and palpated.  Left breast with remarkable change  consistent with response.  No palpable regional adenopathy. CHEST: Breath sounds clear upon auscultation. ABDOMEN: No tenderness upon palpation. No LE edema  LABORATORY DATA:  I have reviewed the data as listed    Latest Ref Rng & Units 06/08/2023   11:05 AM 05/22/2023    4:12 AM 05/18/2023    9:55 AM  CMP  Glucose 70 - 99 mg/dL 81  880  88   BUN 6 - 20 mg/dL 14  20  11    Creatinine 0.44 - 1.00 mg/dL 9.31  9.52  9.30   Sodium 135 - 145 mmol/L 145  137  145   Potassium 3.5 - 5.1 mmol/L 3.4  3.6  3.5   Chloride 98 - 111 mmol/L 113  107  111   CO2 22 - 32 mmol/L 28  22  30    Calcium 8.9 - 10.3 mg/dL 8.6  8.1  8.6   Total Protein 6.5 - 8.1 g/dL 5.8   6.0   Total Bilirubin 0.0 -  1.2 mg/dL 0.5   0.5   Alkaline Phos 38 - 126 U/L 94   93   AST 15 - 41 U/L 27   22   ALT 0 - 44 U/L 19   17     Lab Results  Component Value Date   WBC 6.8 06/29/2023   HGB 8.7 (L) 06/29/2023   HCT 26.5 (L) 06/29/2023   MCV 100.8 (H) 06/29/2023   PLT 408 (H) 06/29/2023   NEUTROABS 3.3 06/29/2023    ASSESSMENT & PLAN:  Malignant neoplasm of upper-inner quadrant of left breast in female, 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  (Left breast mass, left cervical, axillary, subpectoral, supraclavicular and prevascular mediastinal lymph nodes, no metastasis in the abdomen)  07/11/2022-01/16/2023: THP followed by HP maintenance Started Enhertu  second line 02/01/2023: Enhertu  cycle 7 today  Breast Cancer Progressed while on Herceptin  Perjeta . Mastectomy scheduled for March 6th.  -Hold chemotherapy after today's dose in preparation for surgery.. Post-surgery, considering switching to kadcyla  maintenance  Thyroid  Nodule Biopsy scheduled for Tuesday. No current swallowing difficulties or other symptoms. -Continue with planned biopsy.  Influenza Exposure Patient's daughter, a runner, broadcasting/film/video, has been exposed to multiple cases of influenza. Patient has not been directly exposed but is concerned due to close contact with daughter. -Provide prescription for Tamiflu  for post-exposure prophylaxis, to be used if patient feels she has been exposed.  Lactose Intolerance Patient has been experiencing stomach pain and diarrhea, which have improved with reduction in dairy intake. -Advise patient to continue avoiding lactose and consider carrying lactase capsules for occasions when lactose cannot be avoided.  General Health Maintenance -Continue Bumex as currently prescribed. -Check blood work results from today's visit.   Follow-up RTC as scheduled.  No orders of the defined types were placed in this encounter.  The patient has a good  understanding of the overall plan. she agrees with it. she will call with any problems that may develop before the next visit here. Total time spent: 30 mins including face to face time and time spent for planning, charting and co-ordination of care   Amber Stalls, MD 06/29/23

## 2023-07-02 ENCOUNTER — Other Ambulatory Visit: Payer: Self-pay | Admitting: Hematology and Oncology

## 2023-07-02 ENCOUNTER — Encounter: Payer: Self-pay | Admitting: Hematology and Oncology

## 2023-07-02 ENCOUNTER — Telehealth: Payer: Self-pay | Admitting: Hematology and Oncology

## 2023-07-02 NOTE — Telephone Encounter (Signed)
 Left patient a vm regarding upcoming appointment

## 2023-07-03 ENCOUNTER — Ambulatory Visit (HOSPITAL_COMMUNITY)
Admission: RE | Admit: 2023-07-03 | Discharge: 2023-07-03 | Disposition: A | Discharge status: Home / Self Care | Payer: Commercial Managed Care - PPO | Source: Ambulatory Visit | Attending: Hematology and Oncology | Admitting: Hematology and Oncology

## 2023-07-03 DIAGNOSIS — E041 Nontoxic single thyroid nodule: Secondary | ICD-10-CM | POA: Insufficient documentation

## 2023-07-03 DIAGNOSIS — Z853 Personal history of malignant neoplasm of breast: Secondary | ICD-10-CM | POA: Diagnosis not present

## 2023-07-03 MED ORDER — LIDOCAINE HCL 1 % IJ SOLN
INTRAMUSCULAR | Status: AC
  Filled 2023-07-03: qty 20

## 2023-07-03 NOTE — Procedures (Signed)
US guided FNA x 6 of right mid thyroid nodule performed via 25 gauge needles. Specimens sent for pathology/Afirma testing. No immediate complications. EBL < 2 cc. Medication used-1% lidocaine to skin/SQ tissue.

## 2023-07-06 ENCOUNTER — Ambulatory Visit: Payer: Commercial Managed Care - PPO | Admitting: Physician Assistant

## 2023-07-06 ENCOUNTER — Encounter: Payer: Self-pay | Admitting: Physician Assistant

## 2023-07-06 ENCOUNTER — Encounter: Payer: Self-pay | Admitting: Hematology and Oncology

## 2023-07-06 VITALS — BP 130/70 | HR 98 | Ht 62.0 in | Wt 290.0 lb

## 2023-07-06 DIAGNOSIS — R197 Diarrhea, unspecified: Secondary | ICD-10-CM

## 2023-07-06 MED ORDER — COLESTIPOL HCL 1 G PO TABS
2.0000 g | ORAL_TABLET | Freq: Two times a day (BID) | ORAL | 3 refills | Status: DC
Start: 2023-07-06 — End: 2023-10-08

## 2023-07-06 NOTE — Progress Notes (Signed)
Chief Complaint: Diarrhea  HPI:    Diamond Marshall is a 53 year old female with a past medical history as listed below including depression, breast cancer status postchemotherapy, GERD and multiple others, who was referred to me by Rachel Moulds, MD for a complaint of diarrhea.    04/13/2023 CT of the chest abdomen pelvis with contrast for restaging of metastatic breast cancer with stable left-sided breast skin thickening and nodularity, no new mass lesion, fatty infiltration of the liver, stable mild splenic enlargement distended gallbladder.    06/29/2023 CBC with hemoglobin of 8.7 (appears around baseline over the past 4 months, MCV elevated in the setting of known B12 deficiency.  CMP with an albumin low at 3.3.    06/29/2023 patient followed with oncology at that time discussed severe stomach pain is attributed to lactose intolerance she had alleviated pain and diarrhea by significantly reducing her dairy intake.  Noted a mastectomy scheduled for March 6.    Today, the patient presents to clinic accompanied by her husband who assists with history.  Tells me that she started chemo in February of last year, ever since starting chemo she has had diarrhea.  This has been urgent with occasional accidents and really no consistency as well as an increase in gas in her system and occasional sulfur burps.  Tells me most recently about 2 months ago her cancer doctor started her on Budesonide 3 mg, 3 capsules daily and this is really helped her symptoms.  She has slowed to about 4-5 bowel movements a day and they have some sort of substance to them.  Also has not had an accident about 6 months.  These continue to be urgent though and occasionally she will have wet farts.  Continues a lot of the gas issues.  Was previously tried on Cholestyramine 4 g twice a day as well as Lomotil as needed and Xifaxan course.  None of this really helped like the Budesonide.  Initial stool studies for C. difficile and a GI path panel  were negative.  These were done back in May timeframe.    Also struggles with her appetite, her husband is the cook and the family wants to know what he can make her.  At the cancer center encouraging her to eat more protein.  She has found that she cannot eat dairy anymore as she is now lactose intolerant over the past few months.    Denies fever, chills or blood in her stool.  Past Medical History:  Diagnosis Date   Abdominal hernia    Anemia    b12 deficiency   Anxiety    Arthritis    B12 deficiency    Back pain    Cancer (HCC)    breast cancer   Depression    Family history of adverse reaction to anesthesia    mother vomits after anesthesia   Gallbladder problem    GERD (gastroesophageal reflux disease)    History of hiatal hernia    "small" per patient   Hypertension    Hypothyroidism    Joint pain    Knee pain    Palpitation    "when iron is low"   Pernicious anemia    SOB (shortness of breath)    Thyroid disease    hypothyroidism   Vitamin D deficiency     Past Surgical History:  Procedure Laterality Date   BREAST BIOPSY Left 06/09/2022   [place] LT BREAST BX W LOC DEV 1ST LESION IMG BX SPEC US  GUIDE 06/09/2022 [facility] MAMMOGRAPHY   CESAREAN SECTION  1999   Dilatation and currettagement     for miscarriage 18 years ago   PORTACATH PLACEMENT Right 07/05/2022   Procedure: INSERTION PORT-A-CATH;  Surgeon: Harriette Bouillon, MD;  Location: [facility] OR;  Service: General;  Laterality: Right;    Current Outpatient Medications  Medication Sig Dispense Refill   budesonide (ENTOCORT EC) 3 MG 24 hr capsule TAKE THREE CAPSULES BY MOUTH DAILY 90 capsule 1   carvedilol (COREG) 3.125 MG tablet TAKE ONE TABLET TWICE DAILY WITH MEAL(S) 60 tablet 0   cholestyramine (QUESTRAN) 4 g packet Take by mouth.     colestipol (COLESTID) 5 g granules Take 5 g by mouth 2 (two) times daily. 500 g 12   cyanocobalamin (VITAMIN B12) 1000 MCG/ML injection INJECT 1 ML INTRAMUSCULARLY EVERY 15 DAYS 30 mL 3    cycloSPORINE (RESTASIS) 0.05 % ophthalmic emulsion Place 1 drop into both eyes 2 (two) times daily. 5.5 mL 1   diclofenac (VOLTAREN) 75 MG EC tablet TAKE ONE TABLET TWICE DAILY AS NEEDED. needs appointment FOR refills (Patient not taking: Reported on 06/01/2023) 180 tablet 1   diphenoxylate-atropine (LOMOTIL) 2.5-0.025 MG tablet TAKE TWO TABLETS EVERY 6 HOURS AS NEEDED FOR DIARRHEA 60 tablet 1   EPINEPHrine 0.3 mg/0.3 mL IJ SOAJ injection Inject 0.3 mg into the muscle as needed for anaphylaxis. 2 each 0   escitalopram (LEXAPRO) 20 MG tablet Take 1 tablet (20 mg total) by mouth daily. 90 tablet 3   hydrocortisone (ANUSOL-HC) 2.5 % rectal cream Place 1 Application rectally 2 (two) times daily. (Patient not taking: Reported on 06/01/2023) 30 g 2   lidocaine (XYLOCAINE) 2 % jelly 1 Application by Other route as needed. (Patient not taking: Reported on 06/01/2023) 30 mL 2   lisinopril (ZESTRIL) 20 MG tablet Take 1 tablet (20 mg total) by mouth daily. (Patient not taking: Reported on 06/01/2023) 90 tablet 1   LORazepam (ATIVAN) 0.5 MG tablet Take 1 tablet (0.5 mg total) by mouth every 8 (eight) hours as needed for anxiety. 30 tablet 1   oseltamivir (TAMIFLU) 75 MG capsule Take 1 capsule (75 mg total) by mouth 2 (two) times daily. 10 capsule 0   pantoprazole (PROTONIX) 40 MG tablet Take 1 tablet (40 mg total) by mouth daily. 90 tablet 3   potassium chloride (KLOR-CON) 10 MEQ tablet TAKE TWO TABLETS DAILY AS DIRECTED 60 tablet 0   prochlorperazine (COMPAZINE) 10 MG tablet Take 10 mg by mouth every 6 (six) hours as needed.     promethazine (PHENERGAN) 25 MG tablet Take 1 tablet (25 mg total) by mouth every 6 (six) hours as needed for nausea or vomiting. 30 tablet 0   rifaximin (XIFAXAN) 200 MG tablet Take 1 tablet (200 mg total) by mouth 3 (three) times daily. For chemo related diarrhea 90 tablet 5   No current facility-administered medications for this visit.    Allergies as of 07/06/2023 - Review Complete  06/29/2023  Allergen Reaction Noted   Misc. sulfonamide containing compounds Rash and Itching 03/27/2011   Phentermine  03/15/2016   Bee venom Swelling 06/27/2022   Peanut allergen powder-dnfp Other (See Comments) 07/04/2022   Topiramate Other (See Comments) 07/21/2018   Septra [sulfamethoxazole-trimethoprim] Rash 10/09/2012   Sulfa antibiotics Rash 12/16/2021    Family History  Problem Relation Age of Onset   Hypertension Mother    Diabetes Mother    High Cholesterol Mother    Thyroid disease Mother    Depression Mother  Anxiety disorder Mother    Obesity Mother    CVA Father        hemorrhagic   Ovarian cancer Maternal Aunt 16   Colon cancer Maternal Aunt    Breast cancer Cousin 59       maternal first cousin, reports negative genetic testing    Social History   Socioeconomic History   Marital status: Married    Spouse name: Kathlene November   Number of children: Not on file   Years of education: Not on file   Highest education level: Not on file  Occupational History   Occupation: [employer] copy  Tobacco Use   Smoking status: Never   Smokeless tobacco: Never  Vaping Use   Vaping status: Never Used  Substance and Sexual Activity   Alcohol use: No   Drug use: No   Sexual activity: Yes    Birth control/protection: Pill  Other Topics Concern   Not on file  Social History Narrative   Not on file   Social Drivers of Health   Financial Resource Strain: Not on file  Food Insecurity: No Food Insecurity (09/30/2022)   Hunger Vital Sign    Worried About Running Out of Food in the Last Year: Never true    Ran Out of Food in the Last Year: Never true  Transportation Needs: No Transportation Needs (09/30/2022)   PRAPARE - Administrator, Civil Service (Medical): No    Lack of Transportation (Non-Medical): No  Physical Activity: Not on file  Stress: Not on file  Social Connections: Unknown (03/28/2022)   [facility] from [facility]    Social Network    Social Network: Not on file  Intimate Partner Violence: Not At Risk (09/30/2022)   Humiliation, Afraid, Rape, and Kick questionnaire    Fear of Current or Ex-Partner: No    Emotionally Abused: No    Physically Abused: No    Sexually Abused: No    Review of Systems:    Constitutional: No fever or chills Skin: No rash  Cardiovascular: No chest pain Respiratory: No SOB  Gastrointestinal: See HPI and otherwise negative Genitourinary: No dysuria or change in urinary frequency Neurological: No headache, dizziness or syncope Musculoskeletal: No new muscle or joint pain Hematologic: No bleeding  Psychiatric: No history of depression or anxiety   Physical Exam:  Vital signs: BP 130/70   Pulse 98   Ht 5\' 2"  (1.575 m)   Wt 290 lb (131.5 kg)   BMI 53.04 kg/m    Constitutional:   Pleasant obese Caucasian female appears to be in NAD, Well developed, Well nourished, alert and cooperative Head:  Normocephalic and atraumatic. Eyes:   PEERL, EOMI. No icterus. Conjunctiva pink. Ears:  Normal auditory acuity. Neck:  Supple Throat: Oral cavity and pharynx without inflammation, swelling or lesion.  Respiratory: Respirations even and unlabored. Lungs clear to auscultation bilaterally.   No wheezes, crackles, or rhonchi.  Cardiovascular: Normal S1, S2. No MRG. Regular rate and rhythm. No peripheral edema, cyanosis or pallor.  Gastrointestinal:  Soft, nondistended, nontender. No rebound or guarding. Normal bowel sounds. No appreciable masses or hepatomegaly. Rectal:  Not performed.  Msk:  Symmetrical without gross deformities. Without edema, no deformity or joint abnormality.  Neurologic:  Alert and  oriented x4;  grossly normal neurologically.  Skin:   Dry and intact without significant lesions or rashes. Psychiatric:  Demonstrates good judgement and reason without abnormal affect or behaviors.  RELEVANT LABS AND IMAGING: CBC  Component Value Date/Time   WBC 6.8  06/29/2023 1124   WBC 3.7 (L) 05/22/2023 0412   RBC 2.63 (L) 06/29/2023 1124   HGB 8.7 (L) 06/29/2023 1124   HGB 12.0 07/21/2021 1511   HCT 26.5 (L) 06/29/2023 1124   HCT 37.6 07/21/2021 1511   PLT 408 (H) 06/29/2023 1124   PLT 359 07/21/2021 1511   MCV 100.8 (H) 06/29/2023 1124   MCV 76 (L) 07/21/2021 1511   MCH 33.1 06/29/2023 1124   MCHC 32.8 06/29/2023 1124   RDW 23.0 (H) 06/29/2023 1124   RDW 15.7 (H) 07/21/2021 1511   LYMPHSABS 2.5 06/29/2023 1124   LYMPHSABS 1.9 07/21/2021 1511   MONOABS 0.6 06/29/2023 1124   EOSABS 0.3 06/29/2023 1124   EOSABS 0.2 07/21/2021 1511   BASOSABS 0.1 06/29/2023 1124   BASOSABS 0.1 07/21/2021 1511    CMP     Component Value Date/Time   NA 144 06/29/2023 1124   NA 139 03/28/2022 0811   K 3.7 06/29/2023 1124   CL 110 06/29/2023 1124   CO2 30 06/29/2023 1124   GLUCOSE 95 06/29/2023 1124   BUN 8 06/29/2023 1124   BUN 14 03/28/2022 0811   CREATININE 0.70 06/29/2023 1124   CREATININE 0.74 10/09/2012 1537   CALCIUM 8.7 (L) 06/29/2023 1124   PROT 5.9 (L) 06/29/2023 1124   PROT 7.1 03/28/2022 0811   ALBUMIN 3.3 (L) 06/29/2023 1124   ALBUMIN 4.2 03/28/2022 0811   AST 29 06/29/2023 1124   ALT 21 06/29/2023 1124   ALKPHOS 93 06/29/2023 1124   BILITOT 0.6 06/29/2023 1124   GFRNONAA >60 06/29/2023 1124   GFRNONAA >89 10/09/2012 1537   GFRAA 96 05/18/2020 1442   GFRAA >89 10/09/2012 1537    Assessment: 1.  Diarrhea: [place] starting chemotherapy, tried multiple therapies including Cholestyramine, Lomotil and finally Budesonide 9 mg daily for the past 2 months which is actually slowed her to only 4-5 loose stools which have actually started to have a little bit of formed to them, still very urgent, stool studies including GI path panel and C. difficile initially negative; thought chemotherapy induced diarrhea  Plan: 1.  Patient is expressing that she has had some relief of symptoms since starting Budesonide.  Would continue this 9 mg daily  for now. 2.  Still has some urgency.  Would recommend starting Colestipol 1 g tabs, 2 tabs twice daily #120 with 2 refills 3.  Can titrate above or add in scheduled Imodium if needed in the future 4.  Discussed a colonoscopy.  She is scheduled for a mastectomy in March and hoping that after that she will have another change in chemotherapy regimen which may help with her GI symptoms. At some point she would benefit from a colonoscopy.  She is not sure she wants to undergo one right now. 5.  Patient to follow in clinic with me in 2 to 3 months.  She was assigned to Dr. Meridee Score today.  If diarrhea does not get much better will repeat stool studies.  Hyacinth Meeker, PA-C [facility] 07/06/2023, 9:32 AM  Cc: Rachel Moulds, MD

## 2023-07-06 NOTE — Patient Instructions (Addendum)
_______________________________________________________  If your blood pressure at your visit was 140/90 or greater, please contact your primary care physician to follow up on this.  If you are age 53 or younger, your body mass index should be between 19-25. Your Body mass index is 53.04 kg/m. If this is out of the aformentioned range listed, please consider follow up with your Primary Care Provider.  ________________________________________________________  The [facility] providers would like to encourage you to use [facility] to communicate with providers for non-urgent requests or questions.  Due to long hold times on the telephone, sending your provider a message by [facility] may be a faster and more efficient way to get a response.  Please allow 48 business hours for a response.  Please remember that this is for non-urgent requests.  _______________________________________________________  We have sent the following medications to your pharmacy for you to pick up at your convenience:  START: Colestipol 2 tablets two times daily  CONTINUE: budesonide  Thank you for entrusting me with your care and choosing [facility].  Hyacinth Meeker, PA-C

## 2023-07-06 NOTE — Progress Notes (Signed)
Attending Physician's Attestation   I have reviewed the chart.   I agree with the Advanced Practitioner's note, impression, and recommendations with any updates as below. Recommend patient also be evaluated for celiac disease with blood serologies.  Would like to also see what her iron indices look like.  As stools are forming, would recommend EPI evaluation with fecal elastase testing.  SIBO breath testing could be considered in future.  IBS/D empiric Xifaxan therapy could also be considered in future. Not sure what is being treated with empiric budesonide, but will defer alteration of treatment budesonide regimen to oncology at this time until actual colonoscopy could be pursued (if symptoms are persisting).  Hopefully, as she is doing better, they can titrate her budesonide and potentially change chemotherapeutic agents in the future.   Corliss Parish, MD [facility] Advanced [facility]oscopy Office # [phone]

## 2023-07-08 ENCOUNTER — Encounter: Payer: Self-pay | Admitting: Hematology and Oncology

## 2023-07-09 ENCOUNTER — Emergency Department (HOSPITAL_COMMUNITY): Payer: Commercial Managed Care - PPO

## 2023-07-09 ENCOUNTER — Encounter (HOSPITAL_COMMUNITY): Payer: Self-pay

## 2023-07-09 ENCOUNTER — Ambulatory Visit: Payer: Commercial Managed Care - PPO | Admitting: Family Medicine

## 2023-07-09 ENCOUNTER — Other Ambulatory Visit: Payer: Self-pay

## 2023-07-09 ENCOUNTER — Other Ambulatory Visit: Payer: Self-pay | Admitting: *Deleted

## 2023-07-09 ENCOUNTER — Telehealth: Payer: Self-pay

## 2023-07-09 ENCOUNTER — Emergency Department (HOSPITAL_COMMUNITY)
Admission: EM | Admit: 2023-07-09 | Discharge: 2023-07-10 | Disposition: A | Discharge status: Home / Self Care | Payer: Commercial Managed Care - PPO | Attending: Emergency Medicine | Admitting: Emergency Medicine

## 2023-07-09 DIAGNOSIS — R059 Cough, unspecified: Secondary | ICD-10-CM | POA: Diagnosis present

## 2023-07-09 DIAGNOSIS — D519 Vitamin B12 deficiency anemia, unspecified: Secondary | ICD-10-CM

## 2023-07-09 DIAGNOSIS — Z79899 Other long term (current) drug therapy: Secondary | ICD-10-CM | POA: Insufficient documentation

## 2023-07-09 DIAGNOSIS — D72829 Elevated white blood cell count, unspecified: Secondary | ICD-10-CM | POA: Insufficient documentation

## 2023-07-09 DIAGNOSIS — C50212 Malignant neoplasm of upper-inner quadrant of left female breast: Secondary | ICD-10-CM | POA: Diagnosis not present

## 2023-07-09 DIAGNOSIS — Z91010 Allergy to peanuts: Secondary | ICD-10-CM | POA: Insufficient documentation

## 2023-07-09 DIAGNOSIS — I10 Essential (primary) hypertension: Secondary | ICD-10-CM | POA: Diagnosis not present

## 2023-07-09 DIAGNOSIS — J101 Influenza due to other identified influenza virus with other respiratory manifestations: Secondary | ICD-10-CM | POA: Insufficient documentation

## 2023-07-09 DIAGNOSIS — R197 Diarrhea, unspecified: Secondary | ICD-10-CM

## 2023-07-09 DIAGNOSIS — E039 Hypothyroidism, unspecified: Secondary | ICD-10-CM | POA: Insufficient documentation

## 2023-07-09 LAB — COMPREHENSIVE METABOLIC PANEL
ALT: 26 U/L (ref 0–44)
AST: 47 U/L — ABNORMAL HIGH (ref 15–41)
Albumin: 2.9 g/dL — ABNORMAL LOW (ref 3.5–5.0)
Alkaline Phosphatase: 90 U/L (ref 38–126)
Anion gap: 10 (ref 5–15)
BUN: 10 mg/dL (ref 6–20)
CO2: 24 mmol/L (ref 22–32)
Calcium: 8.1 mg/dL — ABNORMAL LOW (ref 8.9–10.3)
Chloride: 104 mmol/L (ref 98–111)
Creatinine, Ser: 0.77 mg/dL (ref 0.44–1.00)
GFR, Estimated: >60 mL/min (ref >60)
Glucose, Bld: 119 mg/dL — ABNORMAL HIGH (ref 70–99)
Potassium: 3.4 mmol/L — ABNORMAL LOW (ref 3.5–5.1)
Sodium: 138 mmol/L (ref 135–145)
Total Bilirubin: 1.4 mg/dL — ABNORMAL HIGH (ref 0.0–1.2)
Total Protein: 6.4 g/dL — ABNORMAL LOW (ref 6.5–8.1)

## 2023-07-09 LAB — CBC WITH DIFFERENTIAL/PLATELET
Abs Immature Granulocytes: 0.02 10*3/uL (ref 0.00–0.07)
Basophils Absolute: 0 10*3/uL (ref 0.0–0.1)
Basophils Relative: 1 %
Eosinophils Absolute: 0 10*3/uL (ref 0.0–0.5)
Eosinophils Relative: 1 %
HCT: 28.1 % — ABNORMAL LOW (ref 36.0–46.0)
Hemoglobin: 9 g/dL — ABNORMAL LOW (ref 12.0–15.0)
Immature Granulocytes: 1 %
Lymphocytes Relative: 13 %
Lymphs Abs: 0.4 10*3/uL — ABNORMAL LOW (ref 0.7–4.0)
MCH: 33.6 pg (ref 26.0–34.0)
MCHC: 32 g/dL (ref 30.0–36.0)
MCV: 104.9 fL — ABNORMAL HIGH (ref 80.0–100.0)
Monocytes Absolute: 0.1 10*3/uL (ref 0.1–1.0)
Monocytes Relative: 2 %
Neutro Abs: 2.8 10*3/uL (ref 1.7–7.7)
Neutrophils Relative %: 82 %
Platelets: 129 10*3/uL — ABNORMAL LOW (ref 150–400)
RBC: 2.68 MIL/uL — ABNORMAL LOW (ref 3.87–5.11)
RDW: 19.5 % — ABNORMAL HIGH (ref 11.5–15.5)
WBC: 3.3 10*3/uL — ABNORMAL LOW (ref 4.0–10.5)
nRBC: 0 % (ref 0.0–0.2)

## 2023-07-09 LAB — I-STAT CG4 LACTIC ACID, ED: Lactic Acid, Venous: 1.8 mmol/L (ref 0.5–1.9)

## 2023-07-09 LAB — CYTOLOGY - NON PAP

## 2023-07-09 LAB — PROTIME-INR
INR: 1 (ref 0.8–1.2)
Prothrombin Time: 13.3 s (ref 11.4–15.2)

## 2023-07-09 LAB — RESP PANEL BY RT-PCR (RSV, FLU A&B, COVID)  RVPGX2
Influenza A by PCR: POSITIVE — AB
Influenza B by PCR: NEGATIVE
Resp Syncytial Virus by PCR: NEGATIVE
SARS Coronavirus 2 by RT PCR: NEGATIVE

## 2023-07-09 MED ORDER — ACETAMINOPHEN 325 MG PO TABS
650.0000 mg | ORAL_TABLET | Freq: Once | ORAL | Status: AC | PRN
  Administered 2023-07-09: 650 mg via ORAL
  Filled 2023-07-09: qty 2

## 2023-07-09 MED ORDER — SODIUM CHLORIDE 0.9 % IV BOLUS
1000.0000 mL | Freq: Once | INTRAVENOUS | Status: AC
  Administered 2023-07-10: 1000 mL via INTRAVENOUS

## 2023-07-09 NOTE — Telephone Encounter (Signed)
 This RN spoke with pt per her VM and My Chart note.  My Chart stated request for MMW.  Then VM stated she has developed a head cold with sinus congestion and cough.  Per phone discussion - she had to call on call over the weekend and was able to get the MMW called in.  Per head congestion - cough - she is not having fevers nor is mucus production green.  Discussed use of OTC medications for benefit and to call this office if she does develop a fever or congestion changes.  Pt verbalized understanding.

## 2023-07-09 NOTE — ED Triage Notes (Signed)
 Cough that started yesterday. Pt had 103 degree fever at home and is immunocompromised. C/O weakness Breast cancer, receiving chemo. Last dose 10 days ago.

## 2023-07-09 NOTE — Telephone Encounter (Signed)
-----   Message from Unk Lightning sent at 07/06/2023  2:04 PM EST ----- Regarding: Dr. Elesa Hacker recs Can you see Dr. Elesa Hacker addendum to my last note-he would like iron studies and fecal pancreatic elastase testing also IgA and I TTG.  Thanks, JL L ----- Message ----- From: Lemar Lofty., MD Sent: 07/06/2023  12:52 PM EST To: Unk Lightning, PA     ----- Message ----- From: Unk Lightning, [place] Sent: 07/06/2023  11:17 AM EST To: Lemar Lofty., MD

## 2023-07-09 NOTE — Telephone Encounter (Signed)
 Mansouraty, Netty Starring., MD at 07/06/2023  9:30 AM  Status: Signed   Attending Physician's Attestation    I have reviewed the chart.    I agree with the Advanced Practitioner's note, impression, and recommendations with any updates as below. Recommend patient also be evaluated for celiac disease with blood serologies.  Would like to also see what her iron indices look like.  As stools are forming, would recommend EPI evaluation with fecal elastase testing.  SIBO breath testing could be considered in future.  IBS/D empiric Xifaxan therapy could also be considered in future. Not sure what is being treated with empiric budesonide, but will defer alteration of treatment budesonide regimen to oncology at this time until actual colonoscopy could be pursued (if symptoms are persisting).  Hopefully, as she is doing better, they can titrate her budesonide and potentially change chemotherapeutic agents in the future.

## 2023-07-10 ENCOUNTER — Encounter: Payer: Self-pay | Admitting: Hematology and Oncology

## 2023-07-10 MED ORDER — IBUPROFEN 200 MG PO TABS: 600.0000 mg | ORAL_TABLET | Freq: Once | ORAL | Status: DC

## 2023-07-10 NOTE — Discharge Instructions (Signed)
 You may take over-the-counter medicine for symptomatic relief, such as Tylenol, Motrin, TheraFlu, Alka seltzer , black elderberry, etc. Please limit acetaminophen (Tylenol) to 4000 mg and Ibuprofen (Motrin, Advil, etc.) to 2400 mg for a 24hr period. Please note that other over-the-counter medicine may contain acetaminophen or ibuprofen as a component of their ingredients.   Continue taking the Tamiflu prescribed by your doctor.

## 2023-07-10 NOTE — Telephone Encounter (Signed)
 Called and reviewed Dr. Elesa Hacker recommendations. Pt currently has the flu. Once she is better she would like to have labs drawn at [facility]. I advised pt that I will order labs and stool study to be collected there. I advised pt to call to make sure they can see the order, if they are able to collect everything there that's fine. We will still receive the results. Pt will let me know if they are not able to draw labs/collect stool sample. Pt had no concerns.  Orders in epic to be collected at [facility] per pt request.

## 2023-07-10 NOTE — ED Provider Notes (Signed)
 [place] EMERGENCY DEPARTMENT AT [facility] Provider Note  CSN: [id number] Arrival date & time: 07/09/23 2212  Chief Complaint(s) Cough  HPI Diamond Marshall is a 53 y.o. female with a past medical history listed below including breast cancer currently undergoing chemotherapy here for 1 day of fever and cough.  Positive sick contacts at home stating that her husband has respiratory infection.  He tested negative for COVID, flu with at home test.  She denies any nausea or vomiting.  She does report diarrhea related to chemotherapy.  No urinary symptoms.  No abdominal pain.  No other physical complaints.  The history is provided by the patient.    Past Medical History Past Medical History:  Diagnosis Date   Abdominal hernia    Anemia    b12 deficiency   Anxiety    Arthritis    B12 deficiency    Back pain    Cancer (HCC)    breast cancer   Depression    Family history of adverse reaction to anesthesia    mother vomits after anesthesia   Gallbladder problem    GERD (gastroesophageal reflux disease)    History of hiatal hernia    "small" per patient   Hypertension    Hypothyroidism    Joint pain    Knee pain    Palpitation    "when iron is low"   Pernicious anemia    SOB (shortness of breath)    Thyroid disease    hypothyroidism   Vitamin D deficiency    Patient Active Problem List   Diagnosis Date Noted   Gastroenteritis 09/29/2022   Port-A-Cath in place 07/07/2022   Genetic testing 06/29/2022   Malignant neoplasm of upper-inner quadrant of left breast in female, estrogen receptor positive (HCC) 06/19/2022   Chronic pain of right knee 03/27/2022   Essential hypertension 03/27/2022   Positive self-administered antigen test for COVID-19 03/15/2021   Prediabetes 07/22/2019   Anxiety and depression 03/15/2016   Vitamin D deficiency 03/15/2016   Metabolic syndrome X 08/07/2013   Elevated random blood glucose level 08/07/2013   Hypertriglyceridemia  08/07/2013   ADD (attention deficit disorder) 07/24/2013    hyperlipidemia 10/09/2012   Morbid obesity (HCC) 10/09/2012   Hypothyroidism 10/09/2012   B12 deficiency 10/09/2012   Anemia, unspecified 02/12/2011   Dysthymic disorder 02/12/2011   Migraine headache 02/12/2011   Obsessive-compulsive disorder 02/12/2011   Tinnitus 02/12/2011   Home Medication(s) Prior to Admission medications   Medication Sig Start Date End Date Taking? Authorizing Provider  budesonide (ENTOCORT EC) 3 MG 24 hr capsule TAKE THREE CAPSULES BY MOUTH DAILY 06/13/23   Rachel Moulds, MD  carvedilol (COREG) 3.125 MG tablet TAKE ONE TABLET TWICE DAILY WITH MEAL(S) 06/05/23   Rachel Moulds, MD  cholestyramine (QUESTRAN) 4 g packet Take by mouth. 02/23/23   [provider]  colestipol (COLESTID) 1 g tablet Take 2 tablets (2 g total) by mouth 2 (two) times daily. 07/06/23   Unk Lightning, PA  cyanocobalamin (VITAMIN B12) 1000 MCG/ML injection INJECT 1 ML INTRAMUSCULARLY EVERY 15 DAYS 05/18/23   Serena Croissant, MD  cycloSPORINE (RESTASIS) 0.05 % ophthalmic emulsion Place 1 drop into both eyes 2 (two) times daily. 06/15/23   Rachel Moulds, MD  diphenoxylate-atropine (LOMOTIL) 2.5-0.025 MG tablet TAKE TWO TABLETS EVERY 6 HOURS AS NEEDED FOR DIARRHEA 04/17/23   Rachel Moulds, MD  EPINEPHrine 0.3 mg/0.3 mL IJ SOAJ injection Inject 0.3 mg into the muscle as needed for anaphylaxis. 12/16/21  Gwenlyn Fudge, FNP  escitalopram (LEXAPRO) 20 MG tablet Take 1 tablet (20 mg total) by mouth daily. 01/03/23   Mechele Claude, MD  hydrocortisone (ANUSOL-HC) 2.5 % rectal cream Place 1 Application rectally 2 (two) times daily. 01/09/23   Garlon Hatchet, PA-C  lidocaine (XYLOCAINE) 2 % jelly 1 Application by Other route as needed. 01/09/23   Garlon Hatchet, PA-C  LORazepam (ATIVAN) 0.5 MG tablet Take 1 tablet (0.5 mg total) by mouth every 8 (eight) hours as needed for anxiety. 06/22/22   Rachel Moulds, MD  oseltamivir  (TAMIFLU) 75 MG capsule Take 1 capsule (75 mg total) by mouth 2 (two) times daily. 06/29/23   Rachel Moulds, MD  pantoprazole (PROTONIX) 40 MG tablet Take 1 tablet (40 mg total) by mouth daily. 01/03/23   Mechele Claude, MD  potassium chloride (KLOR-CON) 10 MEQ tablet TAKE TWO TABLETS DAILY AS DIRECTED 06/13/23   Loa Socks, NP  prochlorperazine (COMPAZINE) 10 MG tablet Take 10 mg by mouth every 6 (six) hours as needed. 02/15/23   [provider]  promethazine (PHENERGAN) 25 MG tablet Take 1 tablet (25 mg total) by mouth every 6 (six) hours as needed for nausea or vomiting. 03/06/23   Rachel Moulds, MD  rifaximin (XIFAXAN) 200 MG tablet Take 1 tablet (200 mg total) by mouth 3 (three) times daily. For chemo related diarrhea 01/03/23   Mechele Claude, MD                                                                                                                                    Allergies Misc. sulfonamide containing compounds, Phentermine, Bee venom, Lactose intolerance (gi), Peanut allergen powder-dnfp, Topiramate, Septra [sulfamethoxazole-trimethoprim], and Sulfa antibiotics  Review of Systems Review of Systems As noted in HPI  Physical Exam Vital Signs  I have reviewed the triage vital signs BP (!) 145/109   Pulse (!) 137   Temp (!) 103.2 F (39.6 C) (Oral)   Resp 20   Ht 5\' 2"  (1.575 m)   Wt 127 kg   SpO2 96%   BMI 51.21 kg/m   Physical Exam Vitals reviewed.  Constitutional:      General: She is not in acute distress.    Appearance: She is well-developed. She is obese. She is not diaphoretic.  HENT:     Head: Normocephalic and atraumatic.     Comments: Chemo related alopecia    Right Ear: Tympanic membrane normal.     Left Ear: Tympanic membrane normal.     Nose: Nose normal.     Mouth/Throat:     Pharynx: Oropharynx is clear.     Tonsils: No tonsillar exudate or tonsillar abscesses.  Eyes:     General: No scleral icterus.       Right eye: No  discharge.        Left eye: No discharge.     Conjunctiva/sclera: Conjunctivae normal.  Pupils: Pupils are equal, round, and reactive to light.  Cardiovascular:     Rate and Rhythm: Normal rate and regular rhythm.     Heart sounds: No murmur heard.    No friction rub. No gallop.  Pulmonary:     Effort: Pulmonary effort is normal. No respiratory distress.     Breath sounds: Normal breath sounds. No stridor. No wheezing, rhonchi or rales.  Abdominal:     General: There is no distension.     Palpations: Abdomen is soft.     Tenderness: There is no abdominal tenderness.  Musculoskeletal:        General: No tenderness.     Cervical back: Normal range of motion and neck supple.  Skin:    General: Skin is warm and dry.     Findings: No erythema or rash.  Neurological:     Mental Status: She is alert and oriented to person, place, and time.     ED Results and Treatments Labs (all labs ordered are listed, but only abnormal results are displayed) Labs Reviewed  RESP PANEL BY RT-PCR (RSV, FLU A&B, COVID)  RVPGX2 - Abnormal; Notable for the following components:      Result Value   Influenza A by PCR POSITIVE (*)    All other components within normal limits  COMPREHENSIVE METABOLIC PANEL - Abnormal; Notable for the following components:   Potassium 3.4 (*)    Glucose, Bld 119 (*)    Calcium 8.1 (*)    Total Protein 6.4 (*)    Albumin 2.9 (*)    AST 47 (*)    Total Bilirubin 1.4 (*)    All other components within normal limits  CBC WITH DIFFERENTIAL/PLATELET - Abnormal; Notable for the following components:   WBC 3.3 (*)    RBC 2.68 (*)    Hemoglobin 9.0 (*)    HCT 28.1 (*)    MCV 104.9 (*)    RDW 19.5 (*)    Platelets 129 (*)    Lymphs Abs 0.4 (*)    All other components within normal limits  CULTURE, BLOOD (ROUTINE X 2)  CULTURE, BLOOD (ROUTINE X 2)  PROTIME-INR  I-STAT CG4 LACTIC ACID, ED  I-STAT CG4 LACTIC ACID, ED                                                                                                                          EKG  EKG Interpretation Date/Time:    Ventricular Rate:    PR Interval:    QRS Duration:    QT Interval:    QTC Calculation:   R Axis:      Text Interpretation:         Radiology DG Chest Port 1 View Result Date: 07/09/2023 CLINICAL DATA:  Sepsis EXAM: PORTABLE CHEST 1 VIEW COMPARISON:  05/22/2023 FINDINGS: Right-sided central venous port tip at the cavoatrial region. No focal opacity, pleural effusion or pneumothorax. Borderline cardiac enlargement IMPRESSION: No active disease. Borderline  cardiac enlargement. Electronically Signed   By: Jasmine Pang M.D.   On: 07/09/2023 22:54    Medications Ordered in ED Medications  sodium chloride 0.9 % bolus 1,000 mL (has no administration in time range)  acetaminophen (TYLENOL) tablet 650 mg (650 mg Oral Given 07/09/23 2228)   Procedures Procedures  (including critical care time) Medical Decision Making / ED Course   Medical Decision Making Amount and/or Complexity of Data Reviewed Labs: ordered. Decision-making details documented in ED Course. Radiology: ordered and independent interpretation performed. Decision-making details documented in ED Course.  Risk OTC drugs.     Clinical Course as of 07/10/23 0017  Tue Jul 10, 2023  0012 Fever with cough.  Differential diagnosis considered and workup below  Likely viral etiology.  However given history of breast cancer currently undergoing chemotherapy, will need to assess for neutropenia or other sources of bacterial infection that would require antibiotics.  CBC with mild leukocytosis.  Stable hemoglobin.  No neutropenia. CMP without significant electrolyte derangements or renal sufficiency.  No evidence of bili obstruction. Chest x-ray without evidence of pneumonia, pneumothorax, pulmonary edema or pleural effusions. Lactic acid normal  Respiratory panel positive for influenza A.    [PC]  0013 Patient  already has Tamiflu at home. Recommend completing course.  [PC]    Clinical Course User Index [PC] Makyle Eslick, Amadeo Garnet, MD    Final Clinical Impression(s) / ED Diagnoses Final diagnoses:  Influenza A   The patient appears reasonably screened and/or stabilized for discharge and I doubt any other medical condition or other [facility] requiring further screening, evaluation, or treatment in the ED at this time. I have discussed the findings, Dx and Tx plan with the patient/family who expressed understanding and agree(s) with the plan. Discharge instructions discussed at length. The patient/family was given strict return precautions who verbalized understanding of the instructions. No further questions at time of discharge.  Disposition: Discharge  Condition: Good  ED Discharge Orders     None        Follow Up: Mechele Claude, MD [address] [place] [address] [phone]  Call  to schedule an appointment for close follow up    This chart was dictated using voice recognition software.  Despite best efforts to proofread,  errors can occur which can change the documentation meaning.    Nira Conn, MD 07/10/23 [phone]

## 2023-07-14 ENCOUNTER — Encounter: Payer: Self-pay | Admitting: Hematology and Oncology

## 2023-07-15 LAB — CULTURE, BLOOD (ROUTINE X 2): Culture: NO GROWTH

## 2023-07-16 ENCOUNTER — Other Ambulatory Visit: Payer: Self-pay | Admitting: *Deleted

## 2023-07-16 MED ORDER — LIDOCAINE VISCOUS HCL 2 % MT SOLN: 15.0000 mL | OROMUCOSAL | 1 refills | Status: DC | PRN

## 2023-07-20 ENCOUNTER — Encounter: Payer: Self-pay | Admitting: Hematology and Oncology

## 2023-07-20 NOTE — Progress Notes (Signed)
 Surgical Instructions   Your procedure is scheduled on Thursday July 26, 2023. Report to [facility] Main Entrance "A" at 5:30 A.M., then check in with the Admitting office. Any questions or running late day of surgery: call [phone]  Questions prior to your surgery date: call [phone], Monday-Friday, 8am-4pm. If you experience any cold or flu symptoms such as cough, fever, chills, shortness of breath, etc. between now and your scheduled surgery, please notify us at the above number.     Remember:  Do not eat after midnight the night before your surgery   You may drink clear liquids until 4:30 the morning of your surgery.   Clear liquids allowed are: Water, Non-Citrus Juices (without pulp), Carbonated Beverages, Clear Tea (no milk, honey, etc.), Black Coffee Only (NO MILK, CREAM OR POWDERED CREAMER of any kind), and Gatorade.  Patient Instructions  The night before surgery:  No food after midnight. ONLY clear liquids after midnight  The day of surgery (if you do NOT have diabetes):  Drink ONE (1) Pre-Surgery Clear Ensure by 4:30 the morning of surgery. Drink in one sitting. Do not sip.  This drink was given to you during your hospital  pre-op appointment visit.  Nothing else to drink after completing the  Pre-Surgery Clear Ensure.         If you have questions, please contact your surgeon's office.    Take these medicines the morning of surgery with A SIP OF WATER  budesonide (ENTOCORT EC)  carvedilol (COREG)  colestipol (COLESTID)  cycloSPORINE (RESTASIS) 0.05 % ophthalmic emulsion  escitalopram (LEXAPRO)  pantoprazole (PROTONIX)   May take these medicines IF NEEDED: LORazepam (ATIVAN)  prochlorperazine (COMPAZINE)  promethazine (PHENERGAN)  rifaximin (XIFAXAN)   One week prior to surgery, STOP taking any Aspirin (unless otherwise instructed by your surgeon) Aleve, Naproxen, Ibuprofen, Motrin, Advil, Goody's, BC's, all herbal medications, fish oil, and  non-prescription vitamins.                     Do NOT Smoke (Tobacco/Vaping) for 24 hours prior to your procedure.  If you use a CPAP at night, you may bring your mask/headgear for your overnight stay.   You will be asked to remove any contacts, glasses, piercing's, hearing aid's, dentures/partials prior to surgery. Please bring cases for these items if needed.    Patients discharged the day of surgery will not be allowed to drive home, and someone needs to stay with them for 24 hours.  SURGICAL WAITING ROOM VISITATION Patients may have no more than 2 support people in the waiting area - these visitors may rotate.   Pre-op nurse will coordinate an appropriate time for 1 ADULT support person, who may not rotate, to accompany patient in pre-op.  Children under the age of 10 must have an adult with them who is not the patient and must remain in the main waiting area with an adult.  If the patient needs to stay at the hospital during part of their recovery, the visitor guidelines for inpatient rooms apply.  Please refer to the [facility] website for the visitor guidelines for any additional information.   If you received a COVID test during your pre-op visit  it is requested that you wear a mask when out in public, stay away from anyone that may not be feeling well and notify your surgeon if you develop symptoms. If you have been in contact with anyone that has tested positive in the last 10 days please  notify you surgeon.      Pre-operative CHG Bathing Instructions   You can play a key role in reducing the risk of infection after surgery. Your skin needs to be as free of germs as possible. You can reduce the number of germs on your skin by washing with CHG (chlorhexidine gluconate) soap before surgery. CHG is an antiseptic soap that kills germs and continues to kill germs even after washing.   DO NOT use if you have an allergy to chlorhexidine/CHG or antibacterial soaps. If your skin  becomes reddened or irritated, stop using the CHG and notify one of our RNs at [phone].              TAKE A SHOWER THE NIGHT BEFORE SURGERY AND THE DAY OF SURGERY    Please keep in mind the following:  DO NOT shave, including legs and underarms, 48 hours prior to surgery.   You may shave your face before/day of surgery.  Place clean sheets on your bed the night before surgery Use a clean washcloth (not used since being washed) for each shower. DO NOT sleep with pet's night before surgery.  CHG Shower Instructions:  Wash your face and private area with normal soap. If you choose to wash your hair, wash first with your normal shampoo.  After you use shampoo/soap, rinse your hair and body thoroughly to remove shampoo/soap residue.  Turn the water OFF and apply half the bottle of CHG soap to a CLEAN washcloth.  Apply CHG soap ONLY FROM YOUR NECK DOWN TO YOUR TOES (washing for 3-5 minutes)  DO NOT use CHG soap on face, private areas, open wounds, or sores.  Pay special attention to the area where your surgery is being performed.  If you are having back surgery, having someone wash your back for you may be helpful. Wait 2 minutes after CHG soap is applied, then you may rinse off the CHG soap.  Pat dry with a clean towel  Put on clean pajamas    Additional instructions for the day of surgery: DO NOT APPLY any lotions, deodorants or perfumes.   Do not wear jewelry or makeup Do not wear nail polish, gel polish, artificial nails, or any other type of covering on natural nails (fingers and toes) Do not bring valuables to the hospital. [facility] is not responsible for valuables/personal belongings. Put on clean/comfortable clothes.  Please brush your teeth.  Ask your nurse before applying any prescription medications to the skin.

## 2023-07-23 ENCOUNTER — Other Ambulatory Visit: Payer: Self-pay

## 2023-07-23 ENCOUNTER — Encounter (HOSPITAL_COMMUNITY)
Admission: RE | Admit: 2023-07-23 | Discharge: 2023-07-23 | Disposition: A | Discharge status: Home / Self Care | Payer: Commercial Managed Care - PPO | Source: Ambulatory Visit | Attending: Surgery | Admitting: Surgery

## 2023-07-23 ENCOUNTER — Encounter (HOSPITAL_COMMUNITY): Payer: Self-pay | Admitting: *Deleted

## 2023-07-23 VITALS — BP 137/73 | HR 93 | Temp 98.3°F | Resp 19 | Ht 61.0 in | Wt 284.0 lb

## 2023-07-23 DIAGNOSIS — K449 Diaphragmatic hernia without obstruction or gangrene: Secondary | ICD-10-CM | POA: Diagnosis not present

## 2023-07-23 DIAGNOSIS — R002 Palpitations: Secondary | ICD-10-CM | POA: Insufficient documentation

## 2023-07-23 DIAGNOSIS — D510 Vitamin B12 deficiency anemia due to intrinsic factor deficiency: Secondary | ICD-10-CM | POA: Diagnosis not present

## 2023-07-23 DIAGNOSIS — E039 Hypothyroidism, unspecified: Secondary | ICD-10-CM | POA: Insufficient documentation

## 2023-07-23 DIAGNOSIS — Z79899 Other long term (current) drug therapy: Secondary | ICD-10-CM | POA: Diagnosis not present

## 2023-07-23 DIAGNOSIS — C50912 Malignant neoplasm of unspecified site of left female breast: Secondary | ICD-10-CM | POA: Diagnosis not present

## 2023-07-23 DIAGNOSIS — K219 Gastro-esophageal reflux disease without esophagitis: Secondary | ICD-10-CM | POA: Diagnosis not present

## 2023-07-23 DIAGNOSIS — R0609 Other forms of dyspnea: Secondary | ICD-10-CM | POA: Diagnosis not present

## 2023-07-23 DIAGNOSIS — Z6841 Body Mass Index (BMI) 40.0 and over, adult: Secondary | ICD-10-CM | POA: Diagnosis not present

## 2023-07-23 DIAGNOSIS — Z01818 Encounter for other preprocedural examination: Secondary | ICD-10-CM | POA: Diagnosis present

## 2023-07-23 DIAGNOSIS — I10 Essential (primary) hypertension: Secondary | ICD-10-CM | POA: Insufficient documentation

## 2023-07-23 DIAGNOSIS — Z01812 Encounter for preprocedural laboratory examination: Secondary | ICD-10-CM | POA: Insufficient documentation

## 2023-07-23 LAB — CBC
HCT: 26.7 % — ABNORMAL LOW (ref 36.0–46.0)
Hemoglobin: 8.8 g/dL — ABNORMAL LOW (ref 12.0–15.0)
MCH: 35.3 pg — ABNORMAL HIGH (ref 26.0–34.0)
MCHC: 33 g/dL (ref 30.0–36.0)
MCV: 107.2 fL — ABNORMAL HIGH (ref 80.0–100.0)
Platelets: 415 10*3/uL — ABNORMAL HIGH (ref 150–400)
RBC: 2.49 MIL/uL — ABNORMAL LOW (ref 3.87–5.11)
RDW: 22.4 % — ABNORMAL HIGH (ref 11.5–15.5)
WBC: 5.4 10*3/uL (ref 4.0–10.5)
nRBC: 0.4 % — ABNORMAL HIGH (ref 0.0–0.2)

## 2023-07-23 LAB — BASIC METABOLIC PANEL
Anion gap: 8 (ref 5–15)
BUN: <5 mg/dL — ABNORMAL LOW (ref 6–20)
CO2: 26 mmol/L (ref 22–32)
Calcium: 8.4 mg/dL — ABNORMAL LOW (ref 8.9–10.3)
Chloride: 111 mmol/L (ref 98–111)
Creatinine, Ser: 0.74 mg/dL (ref 0.44–1.00)
GFR, Estimated: >60 mL/min (ref >60)
Glucose, Bld: 96 mg/dL (ref 70–99)
Potassium: 3.7 mmol/L (ref 3.5–5.1)
Sodium: 145 mmol/L (ref 135–145)

## 2023-07-23 NOTE — Progress Notes (Signed)
 Surgical Instructions   Your procedure is scheduled on Thursday July 26, 2023. Report to [facility] Main Entrance "A" at 5:30 A.M., then check in with the Admitting office. Any questions or running late day of surgery: call [phone]  Questions prior to your surgery date: call [phone], Monday-Friday, 8am-4pm. If you experience any cold or flu symptoms such as cough, fever, chills, shortness of breath, etc. between now and your scheduled surgery, please notify us at the above number.     Remember:  Do not eat after midnight the night before your surgery   You may drink clear liquids until 4:30 the morning of your surgery.   Clear liquids allowed are: Water, Non-Citrus Juices (without pulp), Carbonated Beverages, Clear Tea (no milk, honey, etc.), Black Coffee Only (NO MILK, CREAM OR POWDERED CREAMER of any kind), and Gatorade.  Patient Instructions     Take these medicines the morning of surgery with A SIP OF WATER  budesonide (ENTOCORT EC)  carvedilol (COREG)  colestipol (COLESTID)  cycloSPORINE (RESTASIS) 0.05 % ophthalmic emulsion  escitalopram (LEXAPRO)  pantoprazole (PROTONIX)   May take these medicines IF NEEDED: LORazepam (ATIVAN)  prochlorperazine (COMPAZINE)  promethazine (PHENERGAN)  rifaximin (XIFAXAN)   One week prior to surgery, STOP taking any Aspirin (unless otherwise instructed by your surgeon) Aleve, Naproxen, Ibuprofen, Motrin, Advil, Goody's, BC's, all herbal medications, fish oil, and non-prescription vitamins.                     Do NOT Smoke (Tobacco/Vaping) for 24 hours prior to your procedure.  If you use a CPAP at night, you may bring your mask/headgear for your overnight stay.   You will be asked to remove any contacts, glasses, piercing's, hearing aid's, dentures/partials prior to surgery. Please bring cases for these items if needed.    Patients discharged the day of surgery will not be allowed to drive home, and someone needs to stay  with them for 24 hours.  SURGICAL WAITING ROOM VISITATION Patients may have no more than 2 support people in the waiting area - these visitors may rotate.   Pre-op nurse will coordinate an appropriate time for 1 ADULT support person, who may not rotate, to accompany patient in pre-op.  Children under the age of 59 must have an adult with them who is not the patient and must remain in the main waiting area with an adult.  If the patient needs to stay at the hospital during part of their recovery, the visitor guidelines for inpatient rooms apply.  Please refer to the [facility] website for the visitor guidelines for any additional information.   If you received a COVID test during your pre-op visit  it is requested that you wear a mask when out in public, stay away from anyone that may not be feeling well and notify your surgeon if you develop symptoms. If you have been in contact with anyone that has tested positive in the last 10 days please notify you surgeon.      Pre-operative CHG Bathing Instructions   You can play a key role in reducing the risk of infection after surgery. Your skin needs to be as free of germs as possible. You can reduce the number of germs on your skin by washing with CHG (chlorhexidine gluconate) soap before surgery. CHG is an antiseptic soap that kills germs and continues to kill germs even after washing.   DO NOT use if you have an allergy to chlorhexidine/CHG or antibacterial  soaps. If your skin becomes reddened or irritated, stop using the CHG and notify one of our RNs at [phone].              TAKE A SHOWER THE NIGHT BEFORE SURGERY AND THE DAY OF SURGERY    Please keep in mind the following:  DO NOT shave, including legs and underarms, 48 hours prior to surgery.   You may shave your face before/day of surgery.  Place clean sheets on your bed the night before surgery Use a clean washcloth (not used since being washed) for each shower. DO NOT sleep with  pet's night before surgery.  CHG Shower Instructions:  Wash your face and private area with normal soap. If you choose to wash your hair, wash first with your normal shampoo.  After you use shampoo/soap, rinse your hair and body thoroughly to remove shampoo/soap residue.  Turn the water OFF and apply half the bottle of CHG soap to a CLEAN washcloth.  Apply CHG soap ONLY FROM YOUR NECK DOWN TO YOUR TOES (washing for 3-5 minutes)  DO NOT use CHG soap on face, private areas, open wounds, or sores.  Pay special attention to the area where your surgery is being performed.  If you are having back surgery, having someone wash your back for you may be helpful. Wait 2 minutes after CHG soap is applied, then you may rinse off the CHG soap.  Pat dry with a clean towel  Put on clean pajamas    Additional instructions for the day of surgery: DO NOT APPLY any lotions, deodorants or perfumes.   Do not wear jewelry or makeup Do not wear nail polish, gel polish, artificial nails, or any other type of covering on natural nails (fingers and toes) Do not bring valuables to the hospital. [facility] is not responsible for valuables/personal belongings. Put on clean/comfortable clothes.  Please brush your teeth.  Ask your nurse before applying any prescription medications to the skin.

## 2023-07-23 NOTE — Progress Notes (Signed)
 PCP - [facility] Cardiologist - Marland Mcalpine  PPM/ICD - denies Device Orders -  Rep Notified -   Chest x-ray - 07/09/23 EKG - 07/11/23 Stress Test - denies ECHO - 06/27/23 Cardiac Cath - denies  Sleep Study - denies CPAP -   Fasting Blood Sugar - na Checks Blood Sugar _____ times a day  Last dose of GLP1 agonist-  na GLP1 instructions:   Blood Thinner Instructions:na Aspirin Instructions:na  ERAS Protcol -clears until 0430 PRE-SURGERY Ensure or G2- no  COVID TEST- na   Anesthesia review: yes -cardiac clearance,Hgb 8.8  Patient denies shortness of breath, fever, cough and chest pain at PAT appointment   All instructions explained to the patient, with a verbal understanding of the material. Patient agrees to go over the instructions while at home for a better understanding. Patient also instructed to wear a mask when out in public prior to surgery. The opportunity to ask questions was provided.

## 2023-07-24 NOTE — Progress Notes (Addendum)
 Anesthesia Chart Review:  Case: [id number] Date/Time: 07/26/23 0715   Procedure: LEFT SKIN REDUCING MASTECTOMY, RIGHT SKIN SPARING, RISK REDUCING MASTECTOMY (Bilateral: Breast) - PEC BLOCK   Anesthesia type: General   Pre-op diagnosis: INFLAMMATORY BREAST CANCER, LEFT   [facility]: [facility] ROOM 08 / [facility]   Surgeons: Harriette Bouillon, MD       DISCUSSION: Patient is a 53 year old female scheduled for the above procedure.     History includes never smoker, HTN, hypothyroidism, GERD, hiatal hernia, pernicious anemia, palpitations, dyspnea, breast cancer (left breast biopsy: IDC + left axillary LN 06/09/22; s/p right Port-a-cath 07/05/22, chemotherapy 07/11/22-01/16/23, started Enhertu 9/12/249/04/2023- ), dyspnea, morbid obesity.    Last visit with oncologist Dr. Al Pimple was on 07/19/23. She was THP followed by HP maintenance from 07/11/22-01/16/23. She had left breast cancer disease progression while on Herceptin Perjecta. She was started on Enhertu second line 02/01/23, cycle 7 on 07/19/23 then holding until after mastectomy surgery. She was referred to cardiologist Dr. Carolan Clines and seen on 06/01/23 after 03/2023 follow-up echo showed LVEF down to 50-55% (appeared normal to Dr. Wyline Mood) with abnormal strain with GLS of -15.3%. Dr Wyline Mood noted 05/22/23 ED visit for palpitations with HR > 160s (? SVT) on her Apple Watch. Palpitations/HR had improved by the time she was evaluated. Troponins and D-dimer were negative. Continue Coreg (added on 05/01/23 for cardioprotection). She did not tolerate lisinopril, as she had hypotension with diarrhea. Dr. Carolan Clines added, "She is planned for a mastectomy and she is acceptable cardiac risk for this procedure.   Overall she has reduced strain seen on her prior echocardiogram.  However her EF still looks preserved and she is asymptomatic.  Her blood pressure is well-controlled on the carvedilol.  Otherwise she can continue with her cancer therapy."  Since then she had a limited  echo on 06/27/23 showed LVEF 55-60%, normal LV/RV function.   EKG on 07/09/23 showed ST at 131 bpm, but in the setting of flu A. Her flu symptoms started 07/08/23 with + flu A test 07/10/23. Her oncologist had already given her Tamiflu to have on hand, which she had started. CXR without evidence of pneumonia, and lactic acid normal. She reported feeling recovered per PAT RN interview and denied SOB, fever, cough.  HR 93 with PAT VS.  07/03/23 right thyroid nodule FNA was negative. Last TSH noted is from 2023 (normal 4.410 on 07/21/21).   She was evaluated by Hyacinth Meeker, PA-C with GI on 07/06/23 for diarrhea which started when she began chemotherapy. Some improvement with eliminating lactose from her diet and starting budesonide, although still with some urgency. Recommended starting Colestipol and titrate or add Imodium as needed. She was hoping to start a different chemotherapy regimen after mastectomy that would have less GI effects. Colonoscopy in the future advised by GI.   H/H 8.8/26.7, previously HGB 8.7-9.0 and HCT 26.5-28.1 since 06/18/23. PLT count 415K.  She is on B12 injections--taking 1000 mcg Q 30 days. She has pending iron studies per GI. Since results are overall stable, will defer additional preoperative orders to surgeon and/or anesthesiologist. I did communicate with Dr. Luisa Hart who did not have any additional preoperative recommendations regarding CBC results.   Anesthesia team to evaluate on the day of surgery. Of note, she told PAT RN staff that her mother died this past 2023-09-01.   VS: BP 137/73   Pulse 93   Temp 36.8 C   Resp 19   Ht 5\' 1"  (1.549 m)  Wt 128.8 kg   LMP 10/13/2019 (Approximate)   SpO2 98%   BMI 53.66 kg/m    PROVIDERS: Mechele Claude, MD is PCP  Carolan Clines, MD is cardiologist Rachel Moulds, MD is Clydene Pugh, MD is RAD-ONC Corliss Parish, MD is GI   LABS: H/H 8.8/26.7, previously HGB 8.7-9.0 and HCT 26.5-28.1 since 06/18/23. See  DISCUSSION. (all labs ordered are listed, but only abnormal results are displayed)  Labs Reviewed  CBC - Abnormal; Notable for the following components:      Result Value   RBC 2.49 (*)    Hemoglobin 8.8 (*)    HCT 26.7 (*)    MCV 107.2 (*)    MCH 35.3 (*)    RDW 22.4 (*)    Platelets 415 (*)    nRBC 0.4 (*)    All other components within normal limits  BASIC METABOLIC PANEL - Abnormal; Notable for the following components:   BUN <5 (*)    Calcium 8.4 (*)    All other components within normal limits     IMAGES: 1V PCXR 07/09/23: FINDINGS: Right-sided central venous port tip at the cavoatrial region. No focal opacity, pleural effusion or pneumothorax. Borderline cardiac enlargement IMPRESSION: No active disease. Borderline cardiac enlargement.  US Thyroid 06/08/23: IMPRESSION: 1. Mildly heterogeneous and slightly atrophic thyroid. 2. Solitary right-sided 1.9 cm TR5 nodule, correlating with the nodule questioned on preceding chest CT, meets imaging criteria to recommend percutaneous sampling as indicated. - The above is in keeping with the ACR TI-RADS recommendations [facility] Radiol 2017;14:[id number]. - 07/03/23 right thyroid FNA :Benign follicular nodule (Bethesda category II)   CTA Chest/abd/pelvis 05/22/23: IMPRESSION: 1. No evidence of aortic dissection or aneurysm. 2. No evidence of PE. 3. No acute chest, abdominal or pelvic pathology identified. 4. Diverticulosis. 5. Splenomegaly. 6. Right-sided thyroid nodule which can be followed up with nonemergent ultrasound as an outpatient.      EKG: EKG 07/09/23 (in setting of flu): ST at 131 bpm   CV: Echo (Limited for additional HER2 therapy) 06/27/23: IMPRESSIONS   1. Limited study.   2. Left ventricular ejection fraction, by estimation, is 55 to 60%. The  left ventricle has normal function. The left ventricle has no regional  wall motion abnormalities.   3. Right ventricular systolic function is normal. The  right ventricular  size is normal.   4. The inferior vena cava is normal in size with greater than 50%  respiratory variability, suggesting right atrial pressure of 3 mmHg.  - Comparison(s): Prior images reviewed side by side. LVEF normal range at  55-60%.    Echo 04/13/23: IMPRESSIONS   1. Left ventricular ejection fraction, by estimation, is 50 to 55%. The  left ventricle has low normal function. Left ventricular endocardial  border not optimally defined to evaluate regional wall motion. Left  ventricular diastolic parameters are  consistent with Grade I diastolic dysfunction (impaired relaxation). The  average left ventricular global longitudinal strain is -15.3 %. The global  longitudinal strain is abnormal.   2. Right ventricular systolic function is normal. The right ventricular  size is normal. Tricuspid regurgitation signal is inadequate for assessing  PA pressure.   3. The mitral valve is grossly normal. No evidence of mitral valve  regurgitation. No evidence of mitral stenosis.   4. The aortic valve was not well visualized. Aortic valve regurgitation  is not visualized. No aortic stenosis is present.   5. The inferior vena cava is normal in size with  greater than 50%  respiratory variability, suggesting right atrial pressure of 3 mmHg.  - Comparison(s): Changes from prior study are noted. LVEF is mildly reduced compared to prior, low normal now.  - Comparison LVEF 60-65% 01/06/23, 65-70% 09/29/22, 60-65% 06/29/22.      Past Medical History:  Diagnosis Date   Abdominal hernia    Anemia    b12 deficiency   Anxiety    Arthritis    B12 deficiency    Back pain    Cancer (HCC)    breast cancer   Depression    Family history of adverse reaction to anesthesia    mother vomits after anesthesia   Gallbladder problem    GERD (gastroesophageal reflux disease)    History of hiatal hernia    "small" per patient   Hypertension    Hypothyroidism    Joint pain    Knee pain     Palpitation    "when iron is low"   Pernicious anemia    SOB (shortness of breath)    Thyroid disease    hypothyroidism   Vitamin D deficiency     Past Surgical History:  Procedure Laterality Date   BREAST BIOPSY Left 06/09/2022   [place] LT BREAST BX W LOC DEV 1ST LESION IMG BX SPEC US GUIDE 06/09/2022 [facility]   CESAREAN SECTION  1999   Dilatation and currettagement     for miscarriage 18 years ago   PORTACATH PLACEMENT Right 07/05/2022   Procedure: INSERTION PORT-A-CATH;  Surgeon: Harriette Bouillon, MD;  Location: [facility];  Service: General;  Laterality: Right;    MEDICATIONS:  budesonide (ENTOCORT EC) 3 MG 24 hr capsule   carvedilol (COREG) 3.125 MG tablet   colestipol (COLESTID) 1 g tablet   cyanocobalamin (VITAMIN B12) 1000 MCG/ML injection   cycloSPORINE (RESTASIS) 0.05 % ophthalmic emulsion   diphenoxylate-atropine (LOMOTIL) 2.5-0.025 MG tablet   EPINEPHrine 0.3 mg/0.3 mL IJ SOAJ injection   escitalopram (LEXAPRO) 20 MG tablet   hydrocortisone (ANUSOL-HC) 2.5 % rectal cream   lidocaine (XYLOCAINE) 2 % jelly   lidocaine (XYLOCAINE) 2 % solution   LORazepam (ATIVAN) 0.5 MG tablet   pantoprazole (PROTONIX) 40 MG tablet   potassium chloride (KLOR-CON) 10 MEQ tablet   prochlorperazine (COMPAZINE) 10 MG tablet   promethazine (PHENERGAN) 25 MG tablet   rifaximin (XIFAXAN) 200 MG tablet   No current facility-administered medications for this encounter.    Shonna Chock, PA-C Surgical Short Stay/Anesthesiology [facility] Phone [phone] [facility] Phone [phone] 07/24/2023 4:14 PM

## 2023-07-24 NOTE — Anesthesia Preprocedure Evaluation (Addendum)
 Anesthesia Evaluation  Patient identified by MRN, date of birth, ID band Patient awake    Reviewed: Allergy & Precautions, H&P , NPO status , Patient's Chart, lab work & pertinent test results, reviewed documented beta blocker date and time   History of Anesthesia Complications (+) Family history of anesthesia reaction  Airway Mallampati: III  TM Distance: >3 FB Neck ROM: Full    Dental no notable dental hx. (+) Teeth Intact, Dental Advisory Given   Pulmonary neg pulmonary ROS   Pulmonary exam normal breath sounds clear to auscultation       Cardiovascular hypertension, Pt. on medications and Pt. on home beta blockers  Rhythm:Regular Rate:Normal     Neuro/Psych  Headaches  Anxiety Depression       GI/Hepatic Neg liver ROS, hiatal hernia,GERD  Medicated,,  Endo/Other  Hypothyroidism  Class 4 obesity  Renal/GU negative Renal ROS  negative genitourinary   Musculoskeletal  (+) Arthritis ,    Abdominal   Peds  Hematology  (+) Blood dyscrasia, anemia   Anesthesia Other Findings   Reproductive/Obstetrics negative OB ROS                             Anesthesia Physical Anesthesia Plan  ASA: 4  Anesthesia Plan: General   Post-op Pain Management: Regional block*, Tylenol PO (pre-op)* and Toradol IV (intra-op)*   Induction: Intravenous  PONV Risk Score and Plan: 4 or greater and Ondansetron, Dexamethasone and Midazolam  Airway Management Planned: Oral ETT and Video Laryngoscope Planned  Additional Equipment:   Intra-op Plan:   Post-operative Plan: Extubation in OR  Informed Consent: I have reviewed the patients History and Physical, chart, labs and discussed the procedure including the risks, benefits and alternatives for the proposed anesthesia with the patient or authorized representative who has indicated his/her understanding and acceptance.     Dental advisory given  Plan  Discussed with: CRNA  Anesthesia Plan Comments: (PAT note written 07/24/2023 by Shonna Chock, PA-C. HGB 8.8, but stable. Last chemo 06/29/23. Will need T&S ordered, if desired. Dr. Luisa Hart has reviewed with no new orders. She has preoperative cardiology input from Dr. Carolan Clines.  )       Anesthesia Quick Evaluation

## 2023-07-25 ENCOUNTER — Other Ambulatory Visit: Payer: Self-pay | Admitting: Hematology and Oncology

## 2023-07-25 NOTE — H&P (Signed)
 Chief Complaint: Follow-up   History of Present Illness: Diamond Marshall is a 53 y.o. female who is seen today for long-term follow-up of her stage IV left breast cancer. 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 She has had an excellent response with recent scanning showing no evidence of metastatic disease. She is interested in undergoing a left mastectomy and actually would prefer to have a right risk-reducing mastectomy for symmetrical purposes..  She has lost about 50 pounds due to treatment from chemotherapy.    Review of Systems: A complete review of systems was obtained from the patient. I have reviewed this information and discussed as appropriate with the patient. See HPI as well for other ROS.    Medical History: Past Medical History: Diagnosis Date Anxiety History of cancer Hyperlipidemia  Patient Active Problem List Diagnosis Hyperlipidemia Anxiety and depression Essential hypertension Malignant neoplasm of upper-inner quadrant of left breast in female, estrogen receptor positive (CMS/HHS-HCC)  Past Surgical History: Procedure Laterality Date CESAREAN SECTION N/A 1999   Allergies Allergen Reactions Phentermine Other (See Comments) Suicidal ideation  Became suicidal on the medication Sulfa (Sulfonamide Antibiotics) Itching Sulfamethoxazole-Trimethoprim Rash Topiramate Other (See Comments) Suicidal ideation  dizziness  Current Outpatient Medications on File Prior to Visit Medication Sig Dispense Refill cyanocobalamin (VITAMIN B12) 1,000 mcg/mL injection Inject 1 mL into the muscle as directed Every 15 days diclofenac (VOLTAREN) 75 MG EC tablet Take 75 mg by mouth 2 (two) times daily as needed drospirenone-ethinyl estradioL (YASMIN) 3-0.03 mg tablet Take by mouth escitalopram oxalate (LEXAPRO) 20 MG tablet Take 20 mg by mouth once daily levothyroxine (SYNTHROID) 50 MCG tablet  Take 50 mcg by mouth once daily lisinopriL (ZESTRIL) 10 MG tablet Take 10 mg by mouth once daily pantoprazole (PROTONIX) 40 MG DR tablet Take 80 mg by mouth once daily TRULICITY 1.5 mg/0.5 mL subcutaneous pen injector Inject 1.5 mg subcutaneously once a week  No current facility-administered medications on file prior to visit.  Family History Problem Relation Age of Onset Anxiety Mother Obesity Mother Depression Mother Thyroid disease Mother Diabetes Mother Hyperlipidemia (Elevated cholesterol) Mother High blood pressure (Hypertension) Mother Stroke Father   Social History  Tobacco Use Smoking Status Never Smokeless Tobacco Never   Social History  Socioeconomic History Marital status: Married Tobacco Use Smoking status: Never Smokeless tobacco: Never Vaping Use Vaping status: Unknown  Social Drivers of Health  Food Insecurity: No Food Insecurity (09/30/2022) Received from [facility] Hunger Vital Sign Worried About Running Out of Food in the Last Year: Never true Ran Out of Food in the Last Year: Never true Transportation Needs: No Transportation Needs (09/30/2022) Received from [facility] - Transportation Lack of Transportation (Medical): No Lack of Transportation (Non-Medical): No Received from [facility] Housing Stability: Unknown (05/24/2023) Housing Stability Vital Sign Homeless in the Last Year: No  Objective:  There were no vitals filed for this visit. There is no height or weight on file to calculate BMI.  Physical Exam Exam conducted with a chaperone present. Cardiovascular: Rate and Rhythm: Normal rate. Pulmonary: Effort: Pulmonary effort is normal. Neurological: Mental Status: She is alert. Psychiatric: Mood and Affect: Mood normal.    Labs, Imaging and Diagnostic Testing:  CLINICAL DATA: Palpitations, radiating back pain. Metastatic breast cancer.  EXAM: CT ANGIOGRAPHY CHEST, ABDOMEN AND  PELVIS  TECHNIQUE: Non-contrast CT of the chest was initially obtained.  Multidetector CT imaging through the chest, abdomen and  pelvis was performed using the standard protocol during bolus administration of intravenous contrast. Multiplanar reconstructed images and MIPs were obtained and reviewed to evaluate the vascular anatomy.  RADIATION DOSE REDUCTION: This exam was performed according to the departmental dose-optimization program which includes automated exposure control, adjustment of the mA and/or kV according to patient size and/or use of iterative reconstruction technique.  CONTRAST: OMNIPAQUE IOHEXOL 350 MG/ML SOLN  COMPARISON: 04/10/2023.  FINDINGS: CTA CHEST FINDINGS  Cardiovascular: No cardiomegaly. No dissection or aneurysm. No filling defects identified in pulmonary arteries to indicate PE. Catheter tip at the RA/SVC junction.  Mediastinum/Nodes: Right-sided thyroid nodule measuring 1.2 cm. Follow up with ultrasound non emergently recommended. No suspicious mediastinal, hilar or axillary adenopathy. Unremarkable appearance of the esophagus and tracheobronchial tree.  Lungs/Pleura: Minimal bibasilar subsegmental atelectasis or scarring. No pneumonia or pulmonary edema. No pneumothorax or pleural effusion. Lungs are otherwise clear. Tiny right-sided 2 mm nodule observed previously does not persist and likely represented a vessel on end.  Musculoskeletal: Left breast skin thickening presumably related to prior XRT. No acute osseous abnormalities.  Review of the MIP images confirms the above findings.  CTA ABDOMEN AND PELVIS FINDINGS  VASCULAR  Aorta: Normal caliber aorta without aneurysm, dissection, vasculitis or significant stenosis.  Celiac: Patent without evidence of aneurysm, dissection, vasculitis or significant stenosis.  SMA: Patent without evidence of aneurysm, dissection, vasculitis or significant stenosis.  Renals: Both renal  arteries are patent without evidence of aneurysm, dissection, vasculitis, fibromuscular dysplasia or significant stenosis.  IMA: Patent without evidence of aneurysm, dissection, vasculitis or significant stenosis.  Inflow: Patent without evidence of aneurysm, dissection, vasculitis or significant stenosis.  Veins: No obvious venous abnormality within the limitations of this arterial phase study.  Review of the MIP images confirms the above findings.  NON-VASCULAR  Hepatobiliary: No focal liver abnormality is seen. No gallstones, gallbladder wall thickening, or biliary dilatation.  Pancreas: Unremarkable. No pancreatic ductal dilatation or surrounding inflammatory changes.  Spleen: Splenomegaly, 13 cm. No focal lesions.  Adrenals/Urinary Tract: Adrenal glands are unremarkable. Kidneys are normal, without renal calculi, focal lesion, or hydronephrosis. Bladder is unremarkable.  Stomach/Bowel: Stomach is within normal limits. Appendix appears normal. No evidence of bowel wall thickening, distention, or inflammatory changes. Diverticulosis descending and sigmoid colon.  Lymphatic: No suspicious adenopathy.  Reproductive: Uterus and bilateral adnexa are unremarkable.  Other: No abdominal wall hernia or abnormality. No abdominopelvic ascites.  Musculoskeletal: No acute or significant osseous findings.  Review of the MIP images confirms the above findings.  IMPRESSION: 1. No evidence of aortic dissection or aneurysm. 2. No evidence of PE. 3. No acute chest, abdominal or pelvic pathology identified. 4. Diverticulosis. 5. Splenomegaly. 6. Right-sided thyroid nodule which can be followed up with nonemergent ultrasound as an outpatient.   Electronically Signed By: Layla Maw M.D. On: 05/22/2023 08:43  Assessment and Plan:  Diagnoses and all orders for this visit:  Inflammatory breast cancer, left (CMS/HHS-HCC)    She has done remarkably well with  chemotherapy and recent CT scanning of chest abdomen pelvis revealed no residual stage IV disease. This was quite minimal begin with. She does have an interest in bilateral mastectomies. We talked this through with her today in the history of stage IV disease and surgery. Given the fact she is responding so well certainly consideration of mastectomy is warranted. She has concerns about lymphedema and given her high BMI this is warranted. She has no interest in node dissection and her examination is normal as well as  her scanning. Certainly if there is any abnormal nodes in the left these can be removed but she would like to avoid a lymph node dissection. I think this is reasonable given her stage IV disease and the fact this will not impact her survival. She also wishes due to her BMI have the right side removed. This is reasonable as well given her large breast size as well as asymmetry that will be caused by mastectomy. Recommend skin reducing mastectomies that can be done bilaterally. Risk and complications reviewed as well as increased complication risk of the risk reducing side. Also talked about not doing this at all given her stage of disease. She understands the above bit but does have concerns about symmetry and would like to be symmetrical. Cosmesis reviewed today. Potential complications of bleeding, infection, exacerbation of underlying medical problems, death, DVT, cardiovascular event, injury to neighboring structures, numbness, and the need physical therapy afterwards.  No follow-ups on file.  Hayden Rasmussen, MD

## 2023-07-26 ENCOUNTER — Ambulatory Visit (HOSPITAL_COMMUNITY): Admitting: Anesthesiology

## 2023-07-26 ENCOUNTER — Encounter (HOSPITAL_COMMUNITY): Payer: Self-pay | Admitting: Surgery

## 2023-07-26 ENCOUNTER — Observation Stay (HOSPITAL_COMMUNITY)
Admission: RE | Admit: 2023-07-26 | Discharge: 2023-07-27 | Disposition: A | Discharge status: Home / Self Care | Payer: Commercial Managed Care - PPO | Source: Ambulatory Visit | Attending: Surgery | Admitting: Surgery

## 2023-07-26 ENCOUNTER — Other Ambulatory Visit: Payer: Self-pay

## 2023-07-26 ENCOUNTER — Ambulatory Visit (HOSPITAL_COMMUNITY): Admitting: Vascular Surgery

## 2023-07-26 ENCOUNTER — Encounter: Payer: Self-pay | Admitting: Hematology and Oncology

## 2023-07-26 ENCOUNTER — Encounter (HOSPITAL_COMMUNITY)
Admission: RE | Disposition: A | Discharge status: Home / Self Care | Payer: Self-pay | Source: Ambulatory Visit | Attending: Surgery

## 2023-07-26 DIAGNOSIS — N61 Mastitis without abscess: Secondary | ICD-10-CM

## 2023-07-26 DIAGNOSIS — Z79899 Other long term (current) drug therapy: Secondary | ICD-10-CM | POA: Insufficient documentation

## 2023-07-26 DIAGNOSIS — E039 Hypothyroidism, unspecified: Secondary | ICD-10-CM | POA: Diagnosis not present

## 2023-07-26 DIAGNOSIS — Z23 Encounter for immunization: Secondary | ICD-10-CM | POA: Diagnosis not present

## 2023-07-26 DIAGNOSIS — I10 Essential (primary) hypertension: Secondary | ICD-10-CM | POA: Diagnosis not present

## 2023-07-26 DIAGNOSIS — Z7985 Long-term (current) use of injectable non-insulin antidiabetic drugs: Secondary | ICD-10-CM | POA: Diagnosis not present

## 2023-07-26 DIAGNOSIS — C50212 Malignant neoplasm of upper-inner quadrant of left female breast: Principal | ICD-10-CM | POA: Insufficient documentation

## 2023-07-26 DIAGNOSIS — C50919 Malignant neoplasm of unspecified site of unspecified female breast: Principal | ICD-10-CM | POA: Diagnosis present

## 2023-07-26 DIAGNOSIS — Z170 Estrogen receptor positive status [ER+]: Secondary | ICD-10-CM | POA: Diagnosis not present

## 2023-07-26 SURGERY — SIMPLE MASTECTOMY
Anesthesia: General | Site: Breast | Laterality: Bilateral

## 2023-07-26 MED ORDER — 0.9 % SODIUM CHLORIDE (POUR BTL) OPTIME
TOPICAL | Status: DC | PRN
  Administered 2023-07-26: 1000 mL

## 2023-07-26 MED ORDER — BUPIVACAINE LIPOSOME 1.3 % IJ SUSP
INTRAMUSCULAR | Status: DC | PRN
  Administered 2023-07-26 (×2): 5 mL via PERINEURAL

## 2023-07-26 MED ORDER — POTASSIUM CHLORIDE CRYS ER 10 MEQ PO TBCR
10.0000 meq | EXTENDED_RELEASE_TABLET | Freq: Every day | ORAL | Status: DC
  Administered 2023-07-26 – 2023-07-27 (×2): 10 meq via ORAL
  Filled 2023-07-26 (×2): qty 1

## 2023-07-26 MED ORDER — ESCITALOPRAM OXALATE 10 MG PO TABS
20.0000 mg | ORAL_TABLET | Freq: Every day | ORAL | Status: DC
  Administered 2023-07-27: 20 mg via ORAL
  Filled 2023-07-26 (×2): qty 2

## 2023-07-26 MED ORDER — CHLORHEXIDINE GLUCONATE 0.12 % MT SOLN
15.0000 mL | Freq: Once | OROMUCOSAL | Status: AC
  Administered 2023-07-26: 15 mL via OROMUCOSAL
  Filled 2023-07-26: qty 15

## 2023-07-26 MED ORDER — PROPOFOL 10 MG/ML IV BOLUS
INTRAVENOUS | Status: AC
  Filled 2023-07-26: qty 20

## 2023-07-26 MED ORDER — DIPHENOXYLATE-ATROPINE 2.5-0.025 MG PO TABS: 1.0000 | ORAL_TABLET | Freq: Four times a day (QID) | ORAL | Status: DC | PRN

## 2023-07-26 MED ORDER — ACETAMINOPHEN 650 MG RE SUPP: 650.0000 mg | Freq: Four times a day (QID) | RECTAL | Status: DC | PRN

## 2023-07-26 MED ORDER — ONDANSETRON HCL 4 MG/2ML IJ SOLN
INTRAMUSCULAR | Status: DC | PRN
  Administered 2023-07-26: 4 mg via INTRAVENOUS

## 2023-07-26 MED ORDER — CYCLOSPORINE 0.05 % OP EMUL
1.0000 [drp] | Freq: Two times a day (BID) | OPHTHALMIC | Status: DC
  Administered 2023-07-26 – 2023-07-27 (×2): 1 [drp] via OPHTHALMIC
  Filled 2023-07-26 (×3): qty 30

## 2023-07-26 MED ORDER — BUPIVACAINE-EPINEPHRINE (PF) 0.5% -1:200000 IJ SOLN
INTRAMUSCULAR | Status: DC | PRN
  Administered 2023-07-26 (×2): 20 mL via PERINEURAL

## 2023-07-26 MED ORDER — HYDROMORPHONE HCL 1 MG/ML IJ SOLN
0.2500 mg | INTRAMUSCULAR | Status: DC | PRN
  Administered 2023-07-26: 0.5 mg via INTRAVENOUS

## 2023-07-26 MED ORDER — METHOCARBAMOL 500 MG PO TABS: 500.0000 mg | ORAL_TABLET | Freq: Four times a day (QID) | ORAL | Status: DC | PRN

## 2023-07-26 MED ORDER — SODIUM CHLORIDE 0.9 % IV SOLN: INTRAVENOUS | Status: DC | PRN

## 2023-07-26 MED ORDER — BUDESONIDE 3 MG PO CPEP
9.0000 mg | ORAL_CAPSULE | Freq: Every day | ORAL | Status: DC
  Administered 2023-07-27: 9 mg via ORAL
  Filled 2023-07-26: qty 3

## 2023-07-26 MED ORDER — ACETAMINOPHEN 10 MG/ML IV SOLN
INTRAVENOUS | Status: AC
  Filled 2023-07-26: qty 100

## 2023-07-26 MED ORDER — LIDOCAINE 2% (20 MG/ML) 5 ML SYRINGE
INTRAMUSCULAR | Status: AC
Start: 2023-07-26 — End: ?
  Filled 2023-07-26: qty 5

## 2023-07-26 MED ORDER — KETAMINE HCL 50 MG/5ML IJ SOSY
PREFILLED_SYRINGE | INTRAMUSCULAR | Status: AC
  Filled 2023-07-26: qty 5

## 2023-07-26 MED ORDER — PHENYLEPHRINE 80 MCG/ML (10ML) SYRINGE FOR IV PUSH (FOR BLOOD PRESSURE SUPPORT)
PREFILLED_SYRINGE | INTRAVENOUS | Status: DC | PRN
  Administered 2023-07-26 (×2): 160 ug via INTRAVENOUS

## 2023-07-26 MED ORDER — DEXTROSE-SODIUM CHLORIDE 5-0.9 % IV SOLN: INTRAVENOUS | Status: DC

## 2023-07-26 MED ORDER — CEFAZOLIN SODIUM-DEXTROSE 2-4 GM/100ML-% IV SOLN
2.0000 g | Freq: Three times a day (TID) | INTRAVENOUS | Status: AC
  Administered 2023-07-26: 2 g via INTRAVENOUS
  Filled 2023-07-26: qty 100

## 2023-07-26 MED ORDER — EPHEDRINE SULFATE-NACL 50-0.9 MG/10ML-% IV SOSY
PREFILLED_SYRINGE | INTRAVENOUS | Status: DC | PRN
  Administered 2023-07-26: 5 mg via INTRAVENOUS

## 2023-07-26 MED ORDER — TRANEXAMIC ACID 1000 MG/10ML IV SOLN
2000.0000 mg | INTRAVENOUS | Status: AC
  Administered 2023-07-26: 2000 mg via TOPICAL
  Filled 2023-07-26: qty 20

## 2023-07-26 MED ORDER — PHENYLEPHRINE HCL (PRESSORS) 10 MG/ML IV SOLN
INTRAVENOUS | Status: AC
  Filled 2023-07-26: qty 1

## 2023-07-26 MED ORDER — DIPHENHYDRAMINE HCL 50 MG/ML IJ SOLN: 25.0000 mg | Freq: Four times a day (QID) | INTRAMUSCULAR | Status: DC | PRN

## 2023-07-26 MED ORDER — ACETAMINOPHEN 10 MG/ML IV SOLN
INTRAVENOUS | Status: DC | PRN
  Administered 2023-07-26: 1000 mg via INTRAVENOUS

## 2023-07-26 MED ORDER — HYDROMORPHONE HCL 1 MG/ML IJ SOLN
INTRAMUSCULAR | Status: AC
Start: 2023-07-26 — End: 2023-07-27
  Filled 2023-07-26: qty 1

## 2023-07-26 MED ORDER — SUGAMMADEX SODIUM 200 MG/2ML IV SOLN
INTRAVENOUS | Status: AC
Start: 2023-07-26 — End: ?
  Filled 2023-07-26: qty 2

## 2023-07-26 MED ORDER — ROCURONIUM BROMIDE 10 MG/ML (PF) SYRINGE
PREFILLED_SYRINGE | INTRAVENOUS | Status: AC
Start: 2023-07-26 — End: ?
  Filled 2023-07-26: qty 10

## 2023-07-26 MED ORDER — MIDAZOLAM HCL 2 MG/2ML IJ SOLN
INTRAMUSCULAR | Status: AC
  Filled 2023-07-26: qty 2

## 2023-07-26 MED ORDER — PROPOFOL 10 MG/ML IV BOLUS
INTRAVENOUS | Status: DC | PRN
Start: 2023-07-26 — End: 2023-07-26
  Administered 2023-07-26: 100 ug/kg/min via INTRAVENOUS
  Administered 2023-07-26: 200 mg via INTRAVENOUS

## 2023-07-26 MED ORDER — ONDANSETRON 4 MG PO TBDP: 4.0000 mg | ORAL_TABLET | Freq: Four times a day (QID) | ORAL | Status: DC | PRN

## 2023-07-26 MED ORDER — ACETAMINOPHEN 500 MG PO TABS: 1000.0000 mg | ORAL_TABLET | Freq: Once | ORAL | Status: DC

## 2023-07-26 MED ORDER — TRAMADOL HCL 50 MG PO TABS: 50.0000 mg | ORAL_TABLET | Freq: Four times a day (QID) | ORAL | Status: DC | PRN

## 2023-07-26 MED ORDER — CEFAZOLIN SODIUM-DEXTROSE 3-4 GM/150ML-% IV SOLN
3.0000 g | INTRAVENOUS | Status: AC
  Administered 2023-07-26: 3 g via INTRAVENOUS
  Filled 2023-07-26: qty 150

## 2023-07-26 MED ORDER — METOPROLOL TARTRATE 5 MG/5ML IV SOLN: 5.0000 mg | Freq: Four times a day (QID) | INTRAVENOUS | Status: DC | PRN

## 2023-07-26 MED ORDER — INFLUENZA VIRUS VACC SPLIT PF (FLUZONE) 0.5 ML IM SUSY
0.5000 mL | PREFILLED_SYRINGE | INTRAMUSCULAR | Status: AC
  Administered 2023-07-27: 0.5 mL via INTRAMUSCULAR
  Filled 2023-07-26: qty 0.5

## 2023-07-26 MED ORDER — MIDAZOLAM HCL 2 MG/2ML IJ SOLN
INTRAMUSCULAR | Status: DC | PRN
  Administered 2023-07-26: 2 mg via INTRAVENOUS

## 2023-07-26 MED ORDER — ACETAMINOPHEN 325 MG PO TABS: 650.0000 mg | ORAL_TABLET | Freq: Four times a day (QID) | ORAL | Status: DC | PRN

## 2023-07-26 MED ORDER — SUCCINYLCHOLINE CHLORIDE 200 MG/10ML IV SOSY
PREFILLED_SYRINGE | INTRAVENOUS | Status: AC
  Filled 2023-07-26: qty 10

## 2023-07-26 MED ORDER — DEXAMETHASONE SODIUM PHOSPHATE 10 MG/ML IJ SOLN
INTRAMUSCULAR | Status: DC | PRN
  Administered 2023-07-26: 10 mg via INTRAVENOUS

## 2023-07-26 MED ORDER — CARVEDILOL 3.125 MG PO TABS
3.1250 mg | ORAL_TABLET | Freq: Two times a day (BID) | ORAL | Status: DC
  Administered 2023-07-26 – 2023-07-27 (×2): 3.125 mg via ORAL
  Filled 2023-07-26 (×2): qty 1

## 2023-07-26 MED ORDER — HEMOSTATIC AGENTS (NO CHARGE) OPTIME
TOPICAL | Status: DC | PRN
  Administered 2023-07-26 (×6): 1 via TOPICAL

## 2023-07-26 MED ORDER — DIPHENHYDRAMINE HCL 25 MG PO CAPS: 25.0000 mg | ORAL_CAPSULE | Freq: Four times a day (QID) | ORAL | Status: DC | PRN

## 2023-07-26 MED ORDER — ONDANSETRON HCL 4 MG/2ML IJ SOLN
INTRAMUSCULAR | Status: AC
  Filled 2023-07-26: qty 2

## 2023-07-26 MED ORDER — FENTANYL CITRATE (PF) 250 MCG/5ML IJ SOLN
INTRAMUSCULAR | Status: AC
  Filled 2023-07-26: qty 5

## 2023-07-26 MED ORDER — SUGAMMADEX SODIUM 200 MG/2ML IV SOLN
INTRAVENOUS | Status: DC | PRN
Start: 2023-07-26 — End: 2023-07-26
  Administered 2023-07-26: 400 mg via INTRAVENOUS

## 2023-07-26 MED ORDER — CHLORHEXIDINE GLUCONATE CLOTH 2 % EX PADS: 6.0000 | MEDICATED_PAD | Freq: Once | CUTANEOUS | Status: DC

## 2023-07-26 MED ORDER — FENTANYL CITRATE (PF) 250 MCG/5ML IJ SOLN
INTRAMUSCULAR | Status: DC | PRN
  Administered 2023-07-26 (×3): 50 ug via INTRAVENOUS

## 2023-07-26 MED ORDER — PHENYLEPHRINE HCL-NACL 20-0.9 MG/250ML-% IV SOLN
INTRAVENOUS | Status: DC | PRN
  Administered 2023-07-26: 20 ug/min via INTRAVENOUS

## 2023-07-26 MED ORDER — ONDANSETRON HCL 4 MG/2ML IJ SOLN: 4.0000 mg | Freq: Four times a day (QID) | INTRAMUSCULAR | Status: DC | PRN

## 2023-07-26 MED ORDER — COLESTIPOL HCL 1 G PO TABS
2.0000 g | ORAL_TABLET | Freq: Two times a day (BID) | ORAL | Status: DC
  Administered 2023-07-26 – 2023-07-27 (×2): 2 g via ORAL
  Filled 2023-07-26 (×3): qty 2

## 2023-07-26 MED ORDER — HYDROMORPHONE HCL 1 MG/ML IJ SOLN
1.0000 mg | INTRAMUSCULAR | Status: DC | PRN
  Administered 2023-07-27: 1 mg via INTRAVENOUS
  Filled 2023-07-26: qty 1

## 2023-07-26 MED ORDER — OXYCODONE HCL 5 MG PO TABS
5.0000 mg | ORAL_TABLET | ORAL | Status: DC | PRN
  Administered 2023-07-26: 5 mg via ORAL
  Administered 2023-07-26: 10 mg via ORAL
  Filled 2023-07-26: qty 2
  Filled 2023-07-26: qty 1

## 2023-07-26 MED ORDER — LORAZEPAM 0.5 MG PO TABS: 0.5000 mg | ORAL_TABLET | Freq: Three times a day (TID) | ORAL | Status: DC | PRN

## 2023-07-26 MED ORDER — PROPOFOL 1000 MG/100ML IV EMUL
INTRAVENOUS | Status: AC
  Filled 2023-07-26: qty 100

## 2023-07-26 MED ORDER — PANTOPRAZOLE SODIUM 40 MG PO TBEC
40.0000 mg | DELAYED_RELEASE_TABLET | Freq: Every day | ORAL | Status: DC
  Administered 2023-07-27: 40 mg via ORAL
  Filled 2023-07-26: qty 1

## 2023-07-26 MED ORDER — ROCURONIUM BROMIDE 10 MG/ML (PF) SYRINGE
PREFILLED_SYRINGE | INTRAVENOUS | Status: DC | PRN
  Administered 2023-07-26 (×2): 10 mg via INTRAVENOUS
  Administered 2023-07-26: 70 mg via INTRAVENOUS

## 2023-07-26 MED ORDER — ORAL CARE MOUTH RINSE: 15.0000 mL | Freq: Once | OROMUCOSAL | Status: AC

## 2023-07-26 MED ORDER — ENOXAPARIN SODIUM 40 MG/0.4ML IJ SOSY
40.0000 mg | PREFILLED_SYRINGE | INTRAMUSCULAR | Status: DC
  Administered 2023-07-27: 40 mg via SUBCUTANEOUS
  Filled 2023-07-26: qty 0.4

## 2023-07-26 SURGICAL SUPPLY — 36 items
APPLIER CLIP 9.375 MED OPEN (MISCELLANEOUS) ×3 IMPLANT
BAG COUNTER SPONGE SURGICOUNT (BAG) ×2 IMPLANT
BINDER BREAST LRG (GAUZE/BANDAGES/DRESSINGS) IMPLANT
BINDER BREAST XLRG (GAUZE/BANDAGES/DRESSINGS) IMPLANT
BINDER BREAST XXLRG (GAUZE/BANDAGES/DRESSINGS) IMPLANT
BIOPATCH RED 1 DISK 7.0 (GAUZE/BANDAGES/DRESSINGS) IMPLANT
CANISTER SUCT 3000ML PPV (MISCELLANEOUS) ×2 IMPLANT
CHLORAPREP W/TINT 26 (MISCELLANEOUS) ×2 IMPLANT
CLIP APPLIE 9.375 MED OPEN (MISCELLANEOUS) ×2 IMPLANT
COVER SURGICAL LIGHT HANDLE (MISCELLANEOUS) ×2 IMPLANT
DERMABOND ADVANCED .7 DNX12 (GAUZE/BANDAGES/DRESSINGS) ×2 IMPLANT
DRAIN CHANNEL 19F RND (DRAIN) ×2 IMPLANT
DRAPE LAPAROSCOPIC ABDOMINAL (DRAPES) ×2 IMPLANT
DRSG TEGADERM 4X4.75 (GAUZE/BANDAGES/DRESSINGS) IMPLANT
ELECT REM PT RETURN 9FT ADLT (ELECTROSURGICAL) ×1 IMPLANT
ELECTRODE REM PT RTRN 9FT ADLT (ELECTROSURGICAL) ×2 IMPLANT
EVACUATOR SILICONE 100CC (DRAIN) ×2 IMPLANT
GAUZE PAD ABD 8X10 STRL (GAUZE/BANDAGES/DRESSINGS) IMPLANT
GAUZE SPONGE 4X4 12PLY STRL (GAUZE/BANDAGES/DRESSINGS) ×2 IMPLANT
GLOVE BIO SURGEON STRL SZ8 (GLOVE) ×2 IMPLANT
GLOVE BIOGEL PI IND STRL 8 (GLOVE) ×2 IMPLANT
GOWN STRL REUS W/ TWL LRG LVL3 (GOWN DISPOSABLE) ×4 IMPLANT
GOWN STRL REUS W/ TWL XL LVL3 (GOWN DISPOSABLE) ×2 IMPLANT
KIT BASIN OR (CUSTOM PROCEDURE TRAY) ×2 IMPLANT
KIT TURNOVER KIT B (KITS) ×2 IMPLANT
NS IRRIG 1000ML POUR BTL (IV SOLUTION) ×2 IMPLANT
PACK GENERAL/GYN (CUSTOM PROCEDURE TRAY) ×2 IMPLANT
PAD ARMBOARD 7.5X6 YLW CONV (MISCELLANEOUS) ×2 IMPLANT
PENCIL SMOKE EVACUATOR (MISCELLANEOUS) ×2 IMPLANT
PIN SAFETY STERILE (MISCELLANEOUS) IMPLANT
POWDER SURGICEL 3.0 GRAM (HEMOSTASIS) IMPLANT
SPONGE T-LAP 18X18 ~~LOC~~+RFID (SPONGE) IMPLANT
SUT MNCRL AB 4-0 PS2 18 (SUTURE) ×2 IMPLANT
SUT VIC AB 3-0 SH 18 (SUTURE) ×2 IMPLANT
TOWEL GREEN STERILE (TOWEL DISPOSABLE) ×2 IMPLANT
TOWEL GREEN STERILE FF (TOWEL DISPOSABLE) ×2 IMPLANT

## 2023-07-26 NOTE — Anesthesia Procedure Notes (Signed)
 Anesthesia Regional Block: Pectoralis block   Pre-Anesthetic Checklist: , timeout performed,  Correct Patient, Correct Site, Correct Laterality,  Correct Procedure, Correct Position, site marked,  Risks and benefits discussed,  Pre-op evaluation,  At surgeon's request and post-op pain management  Laterality: Left and Right  Prep: Maximum Sterile Barrier Precautions used, chloraprep       Needles:  Injection technique: Single-shot  Needle Type: Echogenic Stimulator Needle     Needle Length: 9cm  Needle Gauge: 21     Additional Needles:   Procedures:,,,, ultrasound used (permanent image in chart),,    Narrative:  Start time: 07/26/2023 7:05 AM End time: 07/26/2023 7:20 AM Injection made incrementally with aspirations every 5 mL.  Performed by: Personally  Anesthesiologist: Gaynelle Adu, MD  Additional Notes: Bilateral PECS Blocks

## 2023-07-26 NOTE — Op Note (Addendum)
 Preoperative diagnosis: History of left breast cancer with stage IV disease status post chemotherapy  Postoperative diagnosis: Same  Procedure: Bilateral skin reducing mastectomy  Surgeon: Harriette Bouillon, MD   Assistant: Patrici Ranks RNFA   Anesthesia: General With bilateral pectoral blocks per anesthesia  EBL: 40 cc  Drains: 19 round to each side  Specimen: Bilateral breast tissue to pathology  Indications for procedure: The patient is a 53 year old female was diagnosed 2 years ago with stage IV left breast cancer.  She underwent chemotherapy with a good response.  She has had no evidence of ongoing disease for the last 12 months.  She desired bilateral mastectomies this point in time.  We reviewed the pros and cons of mastectomy in the light of stage IV disease.  We discussed potential long-term survival issues and that this may not impact her overall survival.  We discussed things as lymphedema.  She did not wish to have lymph nodes removed due to her very high BMI and high risk of developing lymphedema given the fact that this would not improve her overall survival and would have less of an impact on local regional recurrence.  We reviewed doing nothing at all.  We discussed the pros and cons of mastectomy in this setting.  She had a strong desire to undergo the procedure and had no interest in reconstruction.  After lengthy conversations the pros and cons of this and potential complications, she was to proceed.The surgical and non surgical options have been discussed with the patient.  Risks of surgery include bleeding,  Infection,  Flap necrosis,  Tissue loss,  Chronic pain, death, Numbness,  And the need for additional procedures.  Reconstruction options also have been discussed with the patient as well.  The patient agrees to proceed.     Description of procedure: The patient was met in the holding area and questions were answered.  Pectoral blocks were placed in the holding area by  anesthesia.  She was then taken to the operative room and placed upon upon the operative room table.  After induction of general anesthesia, both breasts were prepped and draped in a sterile fashion and timeout performed.  The right side was addressed first.  Using an inverted T incision skin flaps were created on the right laterally to the axilla, medially to the sternum and then the superior skin flaps were taken up to the clavicle.  The incision had an inverted V appearance.  The triangular tissue of breast was removed as 1 large specimen.  Hemostasis achieved with cautery.  The breast was dissected off the pectoralis major to include the fascia and then divided once lateral attachments were encountered.  Hemostasis achieved with cautery, TXA soaked sponge and granular Surgicel.  In a similar fashion the left breast was approached.  Using an inverted V incision flaps were created in a similar fashion to the sternum, up to the clavicle and the lateral to lateral attachments.  The breast was then dissected off the chest wall in a medial to lateral fashion until lateral attachments were identified and divided.  There is no evidence of left Eksir adenopathy.  Hemostasis achieved with cautery, Surgicel and Arista as well.  19 round drains were placed on both sides.  These were secured with 2-0 nylon.  Both wounds were then closed in an inverted T orientation to reduce axillary excess tissue and skin with 3-0 Vicryl and 4-0 Monocryl.  Dermabond was applied.  Bulbs placed to drainage.  Breast binder placed.  All counts were found to be correct.  The patient was awoke extubated taken to recovery in satisfactory condition.

## 2023-07-26 NOTE — Interval H&P Note (Signed)
 History and Physical Interval Note:  07/26/2023 7:16 AM  Diamond Marshall  has presented today for surgery, with the diagnosis of INFLAMMATORY BREAST CANCER, LEFT.  The various methods of treatment have been discussed with the patient and family. After consideration of risks, benefits and other options for treatment, the patient has consented to  Procedure(s) with comments: LEFT SKIN REDUCING MASTECTOMY, RIGHT SKIN SPARING, RISK REDUCING MASTECTOMY (Bilateral) - PEC BLOCK as a surgical intervention.  The patient's history has been reviewed, patient examined, no change in status, stable for surgery.  I have reviewed the patient's chart and labs.  Questions were answered to the patient's satisfaction.   The surgical and non surgical options have been discussed with the patient.  Risks of surgery include bleeding,  Infection,  Flap necrosis,  Tissue loss,  Chronic pain, death, Numbness,  And the need for additional procedures.  Reconstruction options also have been discussed with the patient as well.  The patient agrees to proceed.   Jamisha Hoeschen A Seaton Hofmann

## 2023-07-26 NOTE — Discharge Instructions (Signed)
 [facility] [phone]  MASTECTOMY: POST OP INSTRUCTIONS  Always review your discharge instruction sheet given to you by the facility where your surgery was performed. IF YOU HAVE DISABILITY OR FAMILY LEAVE FORMS, YOU MUST BRING THEM TO THE OFFICE FOR PROCESSING.   DO NOT GIVE THEM TO YOUR DOCTOR. A prescription for pain medication may be given to you upon discharge.  Take your pain medication as prescribed, if needed.  If narcotic pain medicine is not needed, then you may take acetaminophen (Tylenol) or ibuprofen (Advil) as needed. Take your usually prescribed medications unless otherwise directed. If you need a refill on your pain medication, please contact your pharmacy.  They will contact our office to request authorization.  Prescriptions will not be filled after 5pm or on week-ends. You should follow a light diet the first few days after arrival home, such as soup and crackers, etc.  Resume your normal diet the day after surgery. Most patients will experience some swelling and bruising on the chest and underarm.  Ice packs will help.  Swelling and bruising can take several days to resolve.  It is common to experience some constipation if taking pain medication after surgery.  Increasing fluid intake and taking a stool softener (such as Colace) will usually help or prevent this problem from occurring.  A mild laxative (Milk of Magnesia or Miralax) should be taken according to package instructions if there are no bowel movements after 48 hours. Unless discharge instructions indicate otherwise, leave your bandage dry and in place until your next appointment in 3-5 days.  You may take a limited sponge bath.  No tube baths or showers until the drains are removed.  You may have steri-strips (small skin tapes) in place directly over the incision.  These strips should be left on the skin for 7-10 days.  If your surgeon used skin glue on the incision, you may shower in 24 hours.   The glue will flake off over the next 2-3 weeks.  Any sutures or staples will be removed at the office during your follow-up visit. DRAINS:  If you have drains in place, it is important to keep a list of the amount of drainage produced each day in your drains.  Before leaving the hospital, you should be instructed on drain care.  Call our office if you have any questions about your drains. ACTIVITIES:  You may resume regular (light) daily activities beginning the next day--such as daily self-care, walking, climbing stairs--gradually increasing activities as tolerated.  You may have sexual intercourse when it is comfortable.  Refrain from any heavy lifting or straining until approved by your doctor. You may drive when you are no longer taking prescription pain medication, you can comfortably wear a seatbelt, and you can safely maneuver your car and apply brakes. RETURN TO WORK:  __________________________________________________________ Bonita Quin should see your doctor in the office for a follow-up appointment approximately 3-5 days after your surgery.  Your doctor's nurse will typically make your follow-up appointment when she calls you with your pathology report.  Expect your pathology report 2-3 business days after your surgery.  You may call to check if you do not hear from [place] after three days.   OTHER INSTRUCTIONS: ______________________________________________________________________________________________ ____________________________________________________________________________________________ WHEN TO CALL YOUR DOCTOR: Fever over 101.0 Nausea and/or vomiting Extreme swelling or bruising Continued bleeding from incision. Increased pain, redness, or drainage from the incision. The clinic staff is available to answer your questions during regular business hours.  Please don't hesitate  to call and ask to speak to one of the nurses for clinical concerns.  If you have a medical emergency, go to the  nearest emergency room or call 911.  A surgeon from [facility] is always on call at the hospital. [address], [place], [place]  [address] ? [address] [address], [place], [place]   [address] [phone] ? [phone] ? FAX [phone] Web site: [email or web address]

## 2023-07-26 NOTE — Anesthesia Postprocedure Evaluation (Signed)
 Anesthesia Post Note  Patient: LANORE RENDEROS  Procedure(s) Performed: LEFT SKIN REDUCING MASTECTOMY, RIGHT SKIN SPARING, RISK REDUCING MASTECTOMY (Bilateral: Breast)     Patient location during evaluation: PACU Anesthesia Type: General and Regional Level of consciousness: awake and alert Pain management: pain level controlled Vital Signs Assessment: post-procedure vital signs reviewed and stable Respiratory status: spontaneous breathing, nonlabored ventilation, respiratory function stable and patient connected to nasal cannula oxygen Cardiovascular status: blood pressure returned to baseline and stable Postop Assessment: no apparent nausea or vomiting Anesthetic complications: no  No notable events documented.  Last Vitals:  Vitals:   07/26/23 1145 07/26/23 1200  BP: 130/81 138/78  Pulse: 76 76  Resp: 19 (!) 21  Temp:    SpO2: 94% 93%    Last Pain:  Vitals:   07/26/23 1145  TempSrc:   PainSc: Asleep                 Bryler Dibble,W. EDMOND

## 2023-07-26 NOTE — Anesthesia Procedure Notes (Signed)
 Procedure Name: Intubation Date/Time: 07/26/2023 7:51 AM  Performed by: Randa Evens, CRNAPre-anesthesia Checklist: Patient identified, Emergency Drugs available, Suction available and Patient being monitored Patient Re-evaluated:Patient Re-evaluated prior to induction Oxygen Delivery Method: Circle System Utilized Preoxygenation: Pre-oxygenation with 100% oxygen Induction Type: IV induction Ventilation: Mask ventilation without difficulty Laryngoscope Size: Glidescope and 3 (LoPro S3) Tube type: Oral Tube size: 7.0 mm Number of attempts: 1 Airway Equipment and Method: Stylet and Oral airway Placement Confirmation: ETT inserted through vocal cords under direct vision, positive ETCO2 and breath sounds checked- equal and bilateral Secured at: 22 cm Tube secured with: Tape Dental Injury: Teeth and Oropharynx as per pre-operative assessment

## 2023-07-26 NOTE — Transfer of Care (Signed)
 Immediate Anesthesia Transfer of Care Note  Patient: Diamond Marshall  Procedure(s) Performed: LEFT SKIN REDUCING MASTECTOMY, RIGHT SKIN SPARING, RISK REDUCING MASTECTOMY (Bilateral: Breast)  Patient Location: PACU  Anesthesia Type:General  Level of Consciousness: sedated and drowsy  Airway & Oxygen Therapy: Patient Spontanous Breathing  Post-op Assessment: Report given to RN  Post vital signs: Reviewed and stable  Last Vitals:  Vitals Value Taken Time  BP 111/72 07/26/23 1115  Temp    Pulse 77 07/26/23 1118  Resp 10 07/26/23 1118  SpO2 96 % 07/26/23 1118  Vitals shown include unfiled device data.  Last Pain:  Vitals:   07/26/23 0607  TempSrc: Oral  PainSc:          Complications: No notable events documented.

## 2023-07-27 ENCOUNTER — Encounter (HOSPITAL_COMMUNITY): Payer: Self-pay | Admitting: Surgery

## 2023-07-27 DIAGNOSIS — C50212 Malignant neoplasm of upper-inner quadrant of left female breast: Secondary | ICD-10-CM | POA: Diagnosis not present

## 2023-07-27 MED ORDER — ACETAMINOPHEN 325 MG PO TABS: 650.0000 mg | ORAL_TABLET | Freq: Four times a day (QID) | ORAL | 0 refills | Status: AC | PRN

## 2023-07-27 MED ORDER — OXYCODONE HCL 5 MG PO TABS: 5.0000 mg | ORAL_TABLET | Freq: Four times a day (QID) | ORAL | 0 refills | Status: DC | PRN

## 2023-07-27 MED ORDER — PHENYLEPHRINE HCL-NACL 20-0.9 MG/250ML-% IV SOLN
INTRAVENOUS | Status: AC
  Filled 2023-07-27: qty 500

## 2023-07-27 MED ORDER — METHOCARBAMOL 500 MG PO TABS: 500.0000 mg | ORAL_TABLET | Freq: Four times a day (QID) | ORAL | 0 refills | Status: DC | PRN

## 2023-07-27 MED ORDER — PROPOFOL 1000 MG/100ML IV EMUL
INTRAVENOUS | Status: AC
  Filled 2023-07-27: qty 300

## 2023-07-27 NOTE — Plan of Care (Signed)

## 2023-07-27 NOTE — Discharge Summary (Signed)
 Physician Discharge Summary  Patient ID: Diamond Marshall MRN: [id number] DOB/AGE: [birth year] 53 y.o.  Admit date: 07/26/2023 Discharge date: 07/27/2023  Admission Diagnoses:Principal Problem:   Inflammatory breast cancer (HCC) Active Problems:   Breast cancer, stage 4 (HCC)  Discharge Diagnoses:  Principal Problem:   Inflammatory breast cancer (HCC) Active Problems:   Breast cancer, stage 4 (HCC)   Discharged Condition: good  Hospital Course: Pt did well post op Tolerated diet, ambulated and had good pain control JP drainage as expected       Treatments: surgery: bilateral mastectomy   Discharge Exam: Blood pressure 129/74, pulse 77, temperature 98 F (36.7 C), temperature source Oral, resp. rate 18, height 5\' 1"  (1.549 m), weight 127.5 kg, last menstrual period 10/13/2019, SpO2 94%, unknown if currently breastfeeding. General appearance: alert and cooperative Resp: clear to auscultation bilaterally Cardio: NSR  Incision/Wound:wounds CDI no hematoma flaps viable   Disposition: Discharge disposition: 01-Home or Self Care       Discharge Instructions     Diet - low sodium heart healthy   Complete by: As directed    Increase activity slowly   Complete by: As directed       Allergies as of 07/27/2023       Reactions   Misc. Sulfonamide Containing Compounds Rash, Itching   Phentermine    Became suicidal on the medication   Bee Venom Swelling   Lactose Intolerance (gi) Diarrhea   Peanut Allergen Powder-dnfp Other (See Comments)   Migraine   Topiramate Other (See Comments)   dizziness   Septra [sulfamethoxazole-trimethoprim] Rash   Sulfa Antibiotics Rash   Tape Rash        Medication List     TAKE these medications    acetaminophen 325 MG tablet Commonly known as: TYLENOL Take 2 tablets (650 mg total) by mouth every 6 (six) hours as needed for mild pain (pain score 1-3) (or Fever >/= 101).   budesonide 3 MG 24 hr capsule Commonly known as: ENTOCORT  EC TAKE THREE CAPSULES BY MOUTH DAILY   carvedilol 3.125 MG tablet Commonly known as: COREG TAKE ONE TABLET TWICE DAILY WITH MEAL(S)   colestipol 1 g tablet Commonly known as: COLESTID Take 2 tablets (2 g total) by mouth 2 (two) times daily.   cyanocobalamin 1000 MCG/ML injection Commonly known as: VITAMIN B12 INJECT 1 ML INTRAMUSCULARLY EVERY 15 DAYS What changed:  how much to take how to take this when to take this additional instructions   cycloSPORINE 0.05 % ophthalmic emulsion Commonly known as: RESTASIS Place 1 drop into both eyes 2 (two) times daily.   diphenoxylate-atropine 2.5-0.025 MG tablet Commonly known as: LOMOTIL TAKE TWO TABLETS EVERY 6 HOURS AS NEEDED FOR DIARRHEA   EPINEPHrine 0.3 mg/0.3 mL Soaj injection Commonly known as: EPI-PEN Inject 0.3 mg into the muscle as needed for anaphylaxis.   escitalopram 20 MG tablet Commonly known as: LEXAPRO Take 1 tablet (20 mg total) by mouth daily.   hydrocortisone 2.5 % rectal cream Commonly known as: ANUSOL-HC Place 1 Application rectally 2 (two) times daily. What changed:  when to take this reasons to take this   lidocaine 2 % jelly Commonly known as: XYLOCAINE 1 Application by Other route as needed.   lidocaine 2 % solution Commonly known as: XYLOCAINE Use as directed 15 mLs in the mouth or throat as needed for mouth pain.   LORazepam 0.5 MG tablet Commonly known as: ATIVAN Take 1 tablet (0.5 mg total) by mouth every 8 (eight)  hours as needed for anxiety.   methocarbamol 500 MG tablet Commonly known as: ROBAXIN Take 1 tablet (500 mg total) by mouth every 6 (six) hours as needed for muscle spasms.   oxyCODONE 5 MG immediate release tablet Commonly known as: Oxy IR/ROXICODONE Take 1 tablet (5 mg total) by mouth every 6 (six) hours as needed for severe pain (pain score 7-10).   pantoprazole 40 MG tablet Commonly known as: PROTONIX Take 1 tablet (40 mg total) by mouth daily.   potassium chloride  10 MEQ tablet Commonly known as: KLOR-CON TAKE TWO TABLETS DAILY AS DIRECTED   prochlorperazine 10 MG tablet Commonly known as: COMPAZINE Take 10 mg by mouth every 6 (six) hours as needed for nausea or vomiting.   promethazine 25 MG tablet Commonly known as: PHENERGAN Take 1 tablet (25 mg total) by mouth every 6 (six) hours as needed for nausea or vomiting.   rifaximin 200 MG tablet Commonly known as: Xifaxan Take 1 tablet (200 mg total) by mouth 3 (three) times daily. For chemo related diarrhea         Signed: Clovis Pu Juline Sanderford 07/27/2023, 9:33 AM

## 2023-08-01 ENCOUNTER — Encounter: Payer: Self-pay | Admitting: Surgery

## 2023-08-01 LAB — SURGICAL PATHOLOGY

## 2023-08-02 ENCOUNTER — Inpatient Hospital Stay: Payer: Commercial Managed Care - PPO | Attending: Hematology and Oncology | Admitting: Hematology and Oncology

## 2023-08-02 VITALS — BP 152/73 | HR 96 | Temp 98.4°F | Resp 16 | Wt 274.0 lb

## 2023-08-02 DIAGNOSIS — Z9013 Acquired absence of bilateral breasts and nipples: Secondary | ICD-10-CM | POA: Insufficient documentation

## 2023-08-02 DIAGNOSIS — Z800 Family history of malignant neoplasm of digestive organs: Secondary | ICD-10-CM | POA: Insufficient documentation

## 2023-08-02 DIAGNOSIS — Z8041 Family history of malignant neoplasm of ovary: Secondary | ICD-10-CM | POA: Diagnosis not present

## 2023-08-02 DIAGNOSIS — Z803 Family history of malignant neoplasm of breast: Secondary | ICD-10-CM | POA: Insufficient documentation

## 2023-08-02 DIAGNOSIS — Z170 Estrogen receptor positive status [ER+]: Secondary | ICD-10-CM | POA: Diagnosis not present

## 2023-08-02 DIAGNOSIS — C50212 Malignant neoplasm of upper-inner quadrant of left female breast: Secondary | ICD-10-CM | POA: Insufficient documentation

## 2023-08-02 DIAGNOSIS — R5383 Other fatigue: Secondary | ICD-10-CM | POA: Diagnosis not present

## 2023-08-02 DIAGNOSIS — E739 Lactose intolerance, unspecified: Secondary | ICD-10-CM | POA: Insufficient documentation

## 2023-08-02 DIAGNOSIS — R400 Somnolence: Secondary | ICD-10-CM | POA: Diagnosis not present

## 2023-08-02 NOTE — Progress Notes (Signed)
 DISCONTINUE ON PATHWAY REGIMEN - Breast     A cycle is every 21 days:     Fam-trastuzumab deruxtecan-nxki   **Always confirm dose/schedule in your pharmacy ordering system**  PRIOR TREATMENT: [id number]: Fam-trastuzumab Deruxtecan 5.4 mg/kg q21 Days  START OFF PATHWAY REGIMEN - Breast   [id number]:Ado-Trastuzumab Emtansine 3.6 mg/kg IV D1 q21 Days:   A cycle is every 21 days:     Ado-trastuzumab emtansine   **Always confirm dose/schedule in your pharmacy ordering system**  Patient Characteristics: Distant Metastases or Locoregional Recurrent Disease - Unresected, M0 or Locally Advanced Unresectable Disease Progressing after Neoadjuvant and Local Therapies, M0, HER2 Positive, ER Negative, Chemotherapy, Second Line Therapeutic Status: Distant Metastases HER2 Status: Positive (+) ER Status: Negative (-) PR Status: Negative (-) Line of Therapy: Second Line Intent of Therapy: Non-Curative / Palliative Intent, Discussed with Patient

## 2023-08-02 NOTE — Progress Notes (Signed)
 Patient Care Team: Mechele Claude, MD as PCP - General (Family Medicine) Harriette Bouillon, MD as Consulting Physician (General Surgery) Rachel Moulds, MD as Consulting Physician (Hematology and Oncology) Lonie Peak, MD as Attending Physician (Radiation Oncology) Pershing Proud, RN as Oncology Nurse Navigator Donnelly Angelica, RN as Oncology Nurse Navigator   SUMMARY OF ONCOLOGIC HISTORY: Oncology History  Malignant neoplasm of upper-inner quadrant of left breast in female, estrogen receptor positive (HCC)  06/06/2022 Mammogram   Diagnostic mammogram given palpable mass showed suspicious spiculated left breast mass with surrounding nodularity at 9 o'clock position.  This measures up to 3.1 cm sonographically however due to deep location and patient body habitus the mammographic measurement of 5.8 cm is felt to be more accurate.  At least 2 abnormal lymph nodes in the left axilla.  Possible abnormal palpable left supraclavicular lymph node.   06/06/2022 Breast US   Breast ultrasound confirmed these findings.   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  Prognostic showed ER 65% weak to strong staining, PR 15% weak to moderate staining, Ki-67 of 50% and HER2 positive by IHC at 3+    Genetic Testing   [facility] Custom Panel+RNA was Negative. Report date is 06/28/2022.   The Custom Hereditary Cancers Panel offered by [facility] includes sequencing and/or deletion duplication testing of the following 43 genes: APC, ATM, AXIN2, BAP1, BARD1, BMPR1A, BRCA1, BRCA2, BRIP1, CDH1, CDK4, CDKN2A (p14ARF and p16INK4a only), CHEK2, CTNNA1, EPCAM (Deletion/duplication testing only), FH, GREM1 (promoter region duplication testing only), HOXB13, KIT, MBD4, MEN1, MLH1, MSH2, MSH3, MSH6, MUTYH, NF1, NHTL1, PALB2, PDGFRA, PMS2, POLD1, POLE, PTEN,  RAD51C, RAD51D, SMAD4, SMARCA4. STK11, TP53, TSC1, TSC2, and VHL.    06/29/2022 PET scan   IMPRESSION: 1. Hypermetabolic left breast mass, compatible with primary breast malignancy. 2. Hypermetabolic left cervical, axillary, subpectoral, supraclavicular and prevascular mediastinal lymph nodes, compatible with nodal metastatic disease. 3. No evidence of metastatic disease in the abdomen or pelvis. 4. Aortic Atherosclerosis (ICD10-I70.0).     Electronically Signed   By: Allegra Lai M.D.   On: 06/29/2022 09:32   07/11/2022 - 01/16/2023 Chemotherapy   Patient is on Treatment Plan : BREAST DOCEtaxel + Trastuzumab + Pertuzumab (THP) q21d x 8 cycles / Trastuzumab + Pertuzumab q21d x 4 cycles     02/01/2023 -  Chemotherapy   Patient is on Treatment Plan : BREAST METASTATIC Fam-Trastuzumab Deruxtecan-nxki (Enhertu) (5.4) q21d       CHIEF COMPLIANT: S/p bilateral mastectomy  Discussed the use of AI scribe software for clinical note transcription with the patient, who gave verbal consent to proceed.  History of Present Illness    The patient presents with postoperative follow-up after recent breast surgery. She is accompanied by her caregiver.  She is experiencing fatigue and sleepiness since Monday, significantly impacting her daily activities. She has not taken any pain medication since Sunday, despite some pain at the surgical site, specifically in her breasts. The pain is described as manageable.  One of the surgical drains appears to be leaking, although it has not been collecting much fluid. She emptied it at 7 o'clock and notes that it looks painful. No fevers, chills, or signs of infection such as purulent discharge, although one side has felt warm a few times.  She has experienced some phantom itching and muscle spasms post-surgery. She has not  had significant issues with her stomach and is no longer taking Bumex due to anesthesia and medication interactions. She attributes some  previous stomach issues to lactose intolerance and has adjusted her diet accordingly, reducing dairy intake.  Her mother passed away unexpectedly four days before her surgery, delaying the funeral arrangements until her recovery progresses. Her father is concerned about her immunosuppressed state and plans to limit close contact during the funeral to protect her health.  ALLERGIES:  is allergic to misc. sulfonamide containing compounds, phentermine, bee venom, lactose intolerance (gi), peanut allergen powder-dnfp, topiramate, septra [sulfamethoxazole-trimethoprim], sulfa antibiotics, and tape.  MEDICATIONS:  Current Outpatient Medications  Medication Sig Dispense Refill   acetaminophen (TYLENOL) 325 MG tablet Take 2 tablets (650 mg total) by mouth every 6 (six) hours as needed for mild pain (pain score 1-3) (or Fever >/= 101). 60 tablet 0   budesonide (ENTOCORT EC) 3 MG 24 hr capsule TAKE THREE CAPSULES BY MOUTH DAILY 90 capsule 1   carvedilol (COREG) 3.125 MG tablet TAKE ONE TABLET TWICE DAILY WITH MEAL(S) 60 tablet 0   colestipol (COLESTID) 1 g tablet Take 2 tablets (2 g total) by mouth 2 (two) times daily. 60 tablet 3   cyanocobalamin (VITAMIN B12) 1000 MCG/ML injection INJECT 1 ML INTRAMUSCULARLY EVERY 15 DAYS (Patient taking differently: Inject 1,000 mcg into the skin every 30 (thirty) days.) 30 mL 3   cycloSPORINE (RESTASIS) 0.05 % ophthalmic emulsion Place 1 drop into both eyes 2 (two) times daily. 5.5 mL 1   diphenoxylate-atropine (LOMOTIL) 2.5-0.025 MG tablet TAKE TWO TABLETS EVERY 6 HOURS AS NEEDED FOR DIARRHEA 60 tablet 1   EPINEPHrine 0.3 mg/0.3 mL IJ SOAJ injection Inject 0.3 mg into the muscle as needed for anaphylaxis. 2 each 0   escitalopram (LEXAPRO) 20 MG tablet Take 1 tablet (20 mg total) by mouth daily. 90 tablet 3   hydrocortisone (ANUSOL-HC) 2.5 % rectal cream Place 1 Application rectally 2 (two) times daily. (Patient taking differently: Place 1 Application rectally 2 (two)  times daily as needed for hemorrhoids.) 30 g 2   lidocaine (XYLOCAINE) 2 % jelly 1 Application by Other route as needed. 30 mL 2   lidocaine (XYLOCAINE) 2 % solution Use as directed 15 mLs in the mouth or throat as needed for mouth pain. 240 mL 1   LORazepam (ATIVAN) 0.5 MG tablet Take 1 tablet (0.5 mg total) by mouth every 8 (eight) hours as needed for anxiety. 30 tablet 1   methocarbamol (ROBAXIN) 500 MG tablet Take 1 tablet (500 mg total) by mouth every 6 (six) hours as needed for muscle spasms. 20 tablet 0   oxyCODONE (OXY IR/ROXICODONE) 5 MG immediate release tablet Take 1 tablet (5 mg total) by mouth every 6 (six) hours as needed for severe pain (pain score 7-10). 15 tablet 0   pantoprazole (PROTONIX) 40 MG tablet Take 1 tablet (40 mg total) by mouth daily. 90 tablet 3   potassium chloride (KLOR-CON) 10 MEQ tablet TAKE TWO TABLETS DAILY AS DIRECTED 60 tablet 0   prochlorperazine (COMPAZINE) 10 MG tablet Take 10 mg by mouth every 6 (six) hours as needed for nausea or vomiting.     promethazine (PHENERGAN) 25 MG tablet Take 1 tablet (25 mg total) by mouth every 6 (six) hours as needed for nausea or vomiting. 30 tablet 0   rifaximin (XIFAXAN) 200 MG tablet Take 1 tablet (200 mg total) by mouth 3 (three) times daily. For chemo related diarrhea (Patient not taking: Reported on 07/20/2023) 90  tablet 5   No current facility-administered medications for this visit.    PHYSICAL EXAMINATION: ECOG PERFORMANCE STATUS: 1 - Symptomatic but completely ambulatory  Vitals:   08/02/23 1312  BP: (!) 152/73  Pulse: 96  Resp: 16  Temp: 98.4 F (36.9 C)  SpO2: 98%   Filed Weights   08/02/23 1312  Weight: 274 lb (124.3 kg)     NECK: No abnormalities noted upon palpation. Breast:  s.p bilateral mastectomy, healing as expected. No concern for infection, drain in place.  LABORATORY DATA:  I have reviewed the data as listed    Latest Ref Rng & Units 07/23/2023    9:30 AM 07/09/2023   11:09 PM 06/29/2023    11:24 AM  CMP  Glucose 70 - 99 mg/dL 96  161  95   BUN 6 - 20 mg/dL 5  10  8    Creatinine 0.44 - 1.00 mg/dL 0.96  0.45  4.09   Sodium 135 - 145 mmol/L 145  138  144   Potassium 3.5 - 5.1 mmol/L 3.7  3.4  3.7   Chloride 98 - 111 mmol/L 111  104  110   CO2 22 - 32 mmol/L 26  24  30    Calcium 8.9 - 10.3 mg/dL 8.4  8.1  8.7   Total Protein 6.5 - 8.1 g/dL  6.4  5.9   Total Bilirubin 0.0 - 1.2 mg/dL  1.4  0.6   Alkaline Phos 38 - 126 U/L  90  93   AST 15 - 41 U/L  47  29   ALT 0 - 44 U/L  26  21     Lab Results  Component Value Date   WBC 5.4 07/23/2023   HGB 8.8 (L) 07/23/2023   HCT 26.7 (L) 07/23/2023   MCV 107.2 (H) 07/23/2023   PLT 415 (H) 07/23/2023   NEUTROABS 2.8 07/09/2023    ASSESSMENT & PLAN:  Malignant neoplasm of upper-inner quadrant of left breast in female, 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  (Left breast mass, left cervical, axillary, subpectoral, supraclavicular and prevascular mediastinal lymph nodes, no metastasis in the abdomen)  07/11/2022-01/16/2023: THP followed by [facility] maintenance Started Enhertu second line 02/01/2023, now s/p bilateral mastectomy and no evidence of disease.  Postoperative care following breast surgery Experiencing fatigue, somnolence, and pain at the surgical site. Drain not collecting much fluid, no signs of infection or excessive pain. - Monitor for infection or increased pain. - Ensure adequate analgesia. - Monitor drainage, contact provider if concerns arise. - Plan for drain removal once drainage decreases. - Schedule follow-up in three weeks.  Breast cancer No evidence of cancer in bilateral mastectomy specimen. Kadcyla to be initiated post-healing.  - Educated on Kadcyla's mechanism and side effects. - Monitor for side effects during treatment. - Continue blood work monitoring.  Lactose intolerance Manages lactose intolerance with reduced dairy intake and lactase  supplements, improving symptoms. - Continue avoiding lactose-containing foods and use lactase supplements. - Monitor gastrointestinal symptoms and adjust diet.  Follow-up RTC as scheduled.  No orders of the defined types were placed in this encounter.  The patient has a good understanding of the overall plan. she agrees with it. she will call with any problems that may develop before the next visit here. Total time spent: 30 mins including face to face time and time spent for planning, charting and co-ordination of care   Rachel Moulds, MD 08/02/23

## 2023-08-03 ENCOUNTER — Other Ambulatory Visit: Payer: Self-pay

## 2023-08-06 ENCOUNTER — Ambulatory Visit (INDEPENDENT_AMBULATORY_CARE_PROVIDER_SITE_OTHER): Payer: Commercial Managed Care - PPO | Admitting: Family Medicine

## 2023-08-06 ENCOUNTER — Encounter: Payer: Self-pay | Admitting: Family Medicine

## 2023-08-06 DIAGNOSIS — F32A Depression, unspecified: Secondary | ICD-10-CM | POA: Diagnosis not present

## 2023-08-06 DIAGNOSIS — F419 Anxiety disorder, unspecified: Secondary | ICD-10-CM

## 2023-08-06 MED ORDER — PANTOPRAZOLE SODIUM 40 MG PO TBEC: 40.0000 mg | DELAYED_RELEASE_TABLET | Freq: Every day | ORAL | 3 refills | Status: DC

## 2023-08-06 MED ORDER — ESCITALOPRAM OXALATE 20 MG PO TABS
20.0000 mg | ORAL_TABLET | Freq: Every day | ORAL | 3 refills | Status: DC
Start: 2023-08-06 — End: 2023-08-26

## 2023-08-06 NOTE — Progress Notes (Signed)
 Subjective:  Patient ID: Diamond Marshall, female    DOB: [birth year]  Age: 53 y.o. MRN: [id number]  CC: Medical Management of Chronic Issues (Would like you to look at double mastectomy scars. Some drainage and maybe a slight opening on left side. )   HPI Diamond Marshall presents for follow up post mastectomy. Chemo caused mild decrease in LVEF so cardiology put her on carvedilol to prevent CHF.     08/06/2023    1:59 PM 01/03/2023    4:16 PM 01/03/2023    4:01 PM  Depression screen PHQ 2/9  Decreased Interest 0 0 0  Down, Depressed, Hopeless 0 1 0  PHQ - 2 Score 0 1 0  Altered sleeping 0 1   Tired, decreased energy 0 1   Change in appetite 0 0   Feeling bad or failure about yourself  0 0   Trouble concentrating 0 0   Moving slowly or fidgety/restless 0 0   Suicidal thoughts 0 0   PHQ-9 Score 0 3   Difficult doing work/chores Not difficult at all Somewhat difficult     History Diamond Marshall has a past medical history of Abdominal hernia, Anemia, Anxiety, Arthritis, B12 deficiency, Back pain, Cancer (HCC), Depression, Family history of adverse reaction to anesthesia, Gallbladder problem, GERD (gastroesophageal reflux disease), History of hiatal hernia, Hypertension, Hypothyroidism, Joint pain, Knee pain, Palpitation, Pernicious anemia, SOB (shortness of breath), Thyroid disease, and Vitamin D deficiency.   Diamond Marshall has a past surgical history that includes Cesarean section (1999); Dilatation and currettagement; Breast biopsy (Left, 06/09/2022); Portacath placement (Right, 07/05/2022); and Simple mastectomy with axillary sentinel node biopsy (Bilateral, 07/26/2023).   Her family history includes Anxiety disorder in her mother; Breast cancer (age of onset: 1) in her cousin; CVA in her father; Colon cancer in her maternal aunt; Depression in her mother; Diabetes in her mother; High Cholesterol in her mother; Hypertension in her mother; Obesity in her mother; Ovarian cancer (age of onset: 52) in her  maternal aunt; Thyroid disease in her mother.Diamond Marshall reports that Diamond Marshall has never smoked. Diamond Marshall has never used smokeless tobacco. Diamond Marshall reports that Diamond Marshall does not drink alcohol and does not use drugs.    ROS Review of Systems  Constitutional: Negative.   HENT: Negative.    Eyes:  Negative for visual disturbance.  Respiratory:  Negative for shortness of breath.   Cardiovascular:  Positive for chest pain (In the chest wall).  Gastrointestinal:  Negative for abdominal pain.  Musculoskeletal:  Negative for arthralgias.    Objective:  BP 123/70   Pulse 97   Temp (!) 97.5 F (36.4 C)   Ht 5\' 1"  (1.549 m)   Wt 274 lb (124.3 kg)   LMP 10/13/2019 (Approximate)   SpO2 97%   BMI 51.77 kg/m   BP Readings from Last 3 Encounters:  08/06/23 123/70  08/02/23 (!) 152/73  07/27/23 129/74    Wt Readings from Last 3 Encounters:  08/06/23 274 lb (124.3 kg)  08/02/23 274 lb (124.3 kg)  07/26/23 281 lb (127.5 kg)     Physical Exam Constitutional:      General: Diamond Marshall is not in acute distress.    Appearance: Diamond Marshall is well-developed.  Cardiovascular:     Rate and Rhythm: Normal rate and regular rhythm.  Pulmonary:     Breath sounds: Normal breath sounds.  Musculoskeletal:        General: Normal range of motion.  Skin:    General: Skin is warm and dry.  Comments: Bilateral fresh mastectomy scars have some crusting and stitches remain in place across both surgical incisions.  No attempt at reconstruction was performed apparently.  Patient was concerned about opening/dehiscence there is no sign of this.  Diamond Marshall has JP drains in place bilaterally that are draining appropriately  Neurological:     Mental Status: Diamond Marshall is alert and oriented to person, place, and time.       Assessment & Plan:   Diamond Marshall was seen today for medical management of chronic issues.  Diagnoses and all orders for this visit:  Anxiety and depression -     escitalopram (LEXAPRO) 20 MG tablet; Take 1 tablet (20 mg total) by  mouth daily.  Other orders -     pantoprazole (PROTONIX) 40 MG tablet; Take 1 tablet (40 mg total) by mouth daily.     We discussed signs and symptoms of infection with regard to the incisions.  There was minimal hyperemia without any erythema and we discussed should that start spreading away from the incisions that it would additionally be a reason to follow-up  I have discontinued Diamond Marshall's rifaximin, lidocaine, promethazine, prochlorperazine, and lidocaine. I am also having her maintain her EPINEPHrine, LORazepam, hydrocortisone, diphenoxylate-atropine, cyanocobalamin, budesonide, potassium chloride, cycloSPORINE, colestipol, carvedilol, acetaminophen, methocarbamol, oxyCODONE, escitalopram, and pantoprazole.  Allergies as of 08/06/2023       Reactions   Misc. Sulfonamide Containing Compounds Rash, Itching   Phentermine    Became suicidal on the medication   Bee Venom Swelling   Lactose Intolerance (gi) Diarrhea   Peanut Allergen Powder-dnfp Other (See Comments)   Migraine   Topiramate Other (See Comments)   dizziness   Septra [sulfamethoxazole-trimethoprim] Rash   Sulfa Antibiotics Rash   Tape Rash        Medication List        Accurate as of August 06, 2023  5:29 PM. If you have any questions, ask your nurse or doctor.          STOP taking these medications    lidocaine 2 % jelly Commonly known as: XYLOCAINE Stopped by: Diamond Marshall   lidocaine 2 % solution Commonly known as: XYLOCAINE Stopped by: Diamond Marshall   prochlorperazine 10 MG tablet Commonly known as: COMPAZINE Stopped by: Diamond Marshall   promethazine 25 MG tablet Commonly known as: PHENERGAN Stopped by: Diamond Marshall   rifaximin 200 MG tablet Commonly known as: Heritage manager by: Diamond Marshall       TAKE these medications    acetaminophen 325 MG tablet Commonly known as: TYLENOL Take 2 tablets (650 mg total) by mouth every 6 (six) hours as needed for mild pain (pain score  1-3) (or Fever >/= 101).   budesonide 3 MG 24 hr capsule Commonly known as: ENTOCORT EC TAKE THREE CAPSULES BY MOUTH DAILY   carvedilol 3.125 MG tablet Commonly known as: COREG TAKE ONE TABLET TWICE DAILY WITH MEAL(S)   colestipol 1 g tablet Commonly known as: COLESTID Take 2 tablets (2 g total) by mouth 2 (two) times daily.   cyanocobalamin 1000 MCG/ML injection Commonly known as: VITAMIN B12 INJECT 1 ML INTRAMUSCULARLY EVERY 15 DAYS What changed:  how much to take how to take this when to take this additional instructions   cycloSPORINE 0.05 % ophthalmic emulsion Commonly known as: RESTASIS Place 1 drop into both eyes 2 (two) times daily.   diphenoxylate-atropine 2.5-0.025 MG tablet Commonly known as: LOMOTIL TAKE TWO TABLETS EVERY 6 HOURS AS NEEDED FOR DIARRHEA   EPINEPHrine  0.3 mg/0.3 mL Soaj injection Commonly known as: EPI-PEN Inject 0.3 mg into the muscle as needed for anaphylaxis.   escitalopram 20 MG tablet Commonly known as: LEXAPRO Take 1 tablet (20 mg total) by mouth daily.   hydrocortisone 2.5 % rectal cream Commonly known as: ANUSOL-HC Place 1 Application rectally 2 (two) times daily. What changed:  when to take this reasons to take this   LORazepam 0.5 MG tablet Commonly known as: ATIVAN Take 1 tablet (0.5 mg total) by mouth every 8 (eight) hours as needed for anxiety.   methocarbamol 500 MG tablet Commonly known as: ROBAXIN Take 1 tablet (500 mg total) by mouth every 6 (six) hours as needed for muscle spasms.   oxyCODONE 5 MG immediate release tablet Commonly known as: Oxy IR/ROXICODONE Take 1 tablet (5 mg total) by mouth every 6 (six) hours as needed for severe pain (pain score 7-10).   pantoprazole 40 MG tablet Commonly known as: PROTONIX Take 1 tablet (40 mg total) by mouth daily.   potassium chloride 10 MEQ tablet Commonly known as: KLOR-CON TAKE TWO TABLETS DAILY AS DIRECTED         Follow-up: No follow-ups on file.  Mechele Claude, M.D.

## 2023-08-07 ENCOUNTER — Encounter: Payer: Self-pay | Admitting: Hematology and Oncology

## 2023-08-08 ENCOUNTER — Other Ambulatory Visit: Payer: Self-pay

## 2023-08-22 ENCOUNTER — Telehealth: Payer: Self-pay

## 2023-08-22 ENCOUNTER — Other Ambulatory Visit: Payer: Self-pay | Admitting: *Deleted

## 2023-08-22 DIAGNOSIS — C50212 Malignant neoplasm of upper-inner quadrant of left female breast: Secondary | ICD-10-CM

## 2023-08-22 NOTE — Telephone Encounter (Signed)
 Spoke with patient and confirmed appointment on 08/23/23

## 2023-08-23 ENCOUNTER — Inpatient Hospital Stay: Admitting: Hematology and Oncology

## 2023-08-23 ENCOUNTER — Inpatient Hospital Stay (HOSPITAL_COMMUNITY)
Admission: EM | Admit: 2023-08-23 | Discharge: 2023-08-27 | DRG: 862 | Disposition: A | Discharge status: Home Health | Attending: Internal Medicine | Admitting: Internal Medicine

## 2023-08-23 ENCOUNTER — Other Ambulatory Visit: Payer: Self-pay

## 2023-08-23 ENCOUNTER — Encounter: Payer: Self-pay | Admitting: Hematology and Oncology

## 2023-08-23 ENCOUNTER — Emergency Department (HOSPITAL_COMMUNITY)

## 2023-08-23 ENCOUNTER — Encounter (HOSPITAL_COMMUNITY): Payer: Self-pay | Admitting: Internal Medicine

## 2023-08-23 ENCOUNTER — Inpatient Hospital Stay: Attending: Hematology and Oncology

## 2023-08-23 DIAGNOSIS — Z8249 Family history of ischemic heart disease and other diseases of the circulatory system: Secondary | ICD-10-CM

## 2023-08-23 DIAGNOSIS — Z853 Personal history of malignant neoplasm of breast: Secondary | ICD-10-CM

## 2023-08-23 DIAGNOSIS — I10 Essential (primary) hypertension: Secondary | ICD-10-CM | POA: Diagnosis present

## 2023-08-23 DIAGNOSIS — Z8349 Family history of other endocrine, nutritional and metabolic diseases: Secondary | ICD-10-CM | POA: Diagnosis not present

## 2023-08-23 DIAGNOSIS — F32A Depression, unspecified: Secondary | ICD-10-CM | POA: Diagnosis present

## 2023-08-23 DIAGNOSIS — Z79899 Other long term (current) drug therapy: Secondary | ICD-10-CM | POA: Diagnosis not present

## 2023-08-23 DIAGNOSIS — E872 Acidosis, unspecified: Secondary | ICD-10-CM | POA: Diagnosis present

## 2023-08-23 DIAGNOSIS — E739 Lactose intolerance, unspecified: Secondary | ICD-10-CM | POA: Diagnosis present

## 2023-08-23 DIAGNOSIS — Z83438 Family history of other disorder of lipoprotein metabolism and other lipidemia: Secondary | ICD-10-CM

## 2023-08-23 DIAGNOSIS — Z170 Estrogen receptor positive status [ER+]: Secondary | ICD-10-CM | POA: Insufficient documentation

## 2023-08-23 DIAGNOSIS — Z9013 Acquired absence of bilateral breasts and nipples: Secondary | ICD-10-CM | POA: Diagnosis not present

## 2023-08-23 DIAGNOSIS — T8141XA Infection following a procedure, superficial incisional surgical site, initial encounter: Secondary | ICD-10-CM | POA: Diagnosis present

## 2023-08-23 DIAGNOSIS — Z803 Family history of malignant neoplasm of breast: Secondary | ICD-10-CM | POA: Insufficient documentation

## 2023-08-23 DIAGNOSIS — A419 Sepsis, unspecified organism: Secondary | ICD-10-CM | POA: Diagnosis not present

## 2023-08-23 DIAGNOSIS — Z833 Family history of diabetes mellitus: Secondary | ICD-10-CM | POA: Diagnosis not present

## 2023-08-23 DIAGNOSIS — I959 Hypotension, unspecified: Secondary | ICD-10-CM | POA: Diagnosis present

## 2023-08-23 DIAGNOSIS — L03313 Cellulitis of chest wall: Secondary | ICD-10-CM

## 2023-08-23 DIAGNOSIS — L0390 Cellulitis, unspecified: Secondary | ICD-10-CM | POA: Diagnosis not present

## 2023-08-23 DIAGNOSIS — F419 Anxiety disorder, unspecified: Secondary | ICD-10-CM

## 2023-08-23 DIAGNOSIS — Z800 Family history of malignant neoplasm of digestive organs: Secondary | ICD-10-CM | POA: Insufficient documentation

## 2023-08-23 DIAGNOSIS — K219 Gastro-esophageal reflux disease without esophagitis: Secondary | ICD-10-CM | POA: Diagnosis present

## 2023-08-23 DIAGNOSIS — Y836 Removal of other organ (partial) (total) as the cause of abnormal reaction of the patient, or of later complication, without mention of misadventure at the time of the procedure: Secondary | ICD-10-CM | POA: Diagnosis present

## 2023-08-23 DIAGNOSIS — Z882 Allergy status to sulfonamides status: Secondary | ICD-10-CM | POA: Diagnosis not present

## 2023-08-23 DIAGNOSIS — B9562 Methicillin resistant Staphylococcus aureus infection as the cause of diseases classified elsewhere: Secondary | ICD-10-CM | POA: Diagnosis present

## 2023-08-23 DIAGNOSIS — Z6841 Body Mass Index (BMI) 40.0 and over, adult: Secondary | ICD-10-CM | POA: Diagnosis not present

## 2023-08-23 DIAGNOSIS — E039 Hypothyroidism, unspecified: Secondary | ICD-10-CM | POA: Diagnosis present

## 2023-08-23 DIAGNOSIS — T8144XA Sepsis following a procedure, initial encounter: Secondary | ICD-10-CM | POA: Diagnosis present

## 2023-08-23 DIAGNOSIS — E876 Hypokalemia: Secondary | ICD-10-CM | POA: Diagnosis present

## 2023-08-23 DIAGNOSIS — A4102 Sepsis due to Methicillin resistant Staphylococcus aureus: Secondary | ICD-10-CM | POA: Diagnosis present

## 2023-08-23 DIAGNOSIS — C50212 Malignant neoplasm of upper-inner quadrant of left female breast: Secondary | ICD-10-CM | POA: Insufficient documentation

## 2023-08-23 DIAGNOSIS — N61 Mastitis without abscess: Secondary | ICD-10-CM | POA: Diagnosis not present

## 2023-08-23 DIAGNOSIS — Z91030 Bee allergy status: Secondary | ICD-10-CM

## 2023-08-23 DIAGNOSIS — R509 Fever, unspecified: Principal | ICD-10-CM

## 2023-08-23 DIAGNOSIS — Z91010 Allergy to peanuts: Secondary | ICD-10-CM

## 2023-08-23 DIAGNOSIS — Z8041 Family history of malignant neoplasm of ovary: Secondary | ICD-10-CM | POA: Insufficient documentation

## 2023-08-23 DIAGNOSIS — L089 Local infection of the skin and subcutaneous tissue, unspecified: Secondary | ICD-10-CM

## 2023-08-23 DIAGNOSIS — Z823 Family history of stroke: Secondary | ICD-10-CM | POA: Diagnosis not present

## 2023-08-23 DIAGNOSIS — R5383 Other fatigue: Secondary | ICD-10-CM | POA: Insufficient documentation

## 2023-08-23 DIAGNOSIS — Z818 Family history of other mental and behavioral disorders: Secondary | ICD-10-CM

## 2023-08-23 DIAGNOSIS — Z91048 Other nonmedicinal substance allergy status: Secondary | ICD-10-CM | POA: Diagnosis not present

## 2023-08-23 DIAGNOSIS — Z888 Allergy status to other drugs, medicaments and biological substances status: Secondary | ICD-10-CM

## 2023-08-23 LAB — CBC WITH DIFFERENTIAL/PLATELET
Abs Immature Granulocytes: 0.11 10*3/uL — ABNORMAL HIGH (ref 0.00–0.07)
Basophils Absolute: 0.1 10*3/uL (ref 0.0–0.1)
Basophils Relative: 0 %
Eosinophils Absolute: 0 10*3/uL (ref 0.0–0.5)
Eosinophils Relative: 0 %
HCT: 31.3 % — ABNORMAL LOW (ref 36.0–46.0)
Hemoglobin: 9.7 g/dL — ABNORMAL LOW (ref 12.0–15.0)
Immature Granulocytes: 1 %
Lymphocytes Relative: 5 %
Lymphs Abs: 0.8 10*3/uL (ref 0.7–4.0)
MCH: 30.4 pg (ref 26.0–34.0)
MCHC: 31 g/dL (ref 30.0–36.0)
MCV: 98.1 fL (ref 80.0–100.0)
Monocytes Absolute: 1 10*3/uL (ref 0.1–1.0)
Monocytes Relative: 5 %
Neutro Abs: 16.1 10*3/uL — ABNORMAL HIGH (ref 1.7–7.7)
Neutrophils Relative %: 89 %
Platelets: 286 10*3/uL (ref 150–400)
RBC: 3.19 MIL/uL — ABNORMAL LOW (ref 3.87–5.11)
RDW: 16.5 % — ABNORMAL HIGH (ref 11.5–15.5)
Smear Review: NORMAL
WBC: 18.1 10*3/uL — ABNORMAL HIGH (ref 4.0–10.5)
nRBC: 0 % (ref 0.0–0.2)

## 2023-08-23 LAB — COMPREHENSIVE METABOLIC PANEL WITH GFR
ALT: 11 U/L (ref 0–44)
AST: 27 U/L (ref 15–41)
Albumin: 2.8 g/dL — ABNORMAL LOW (ref 3.5–5.0)
Alkaline Phosphatase: 65 U/L (ref 38–126)
Anion gap: 13 (ref 5–15)
BUN: 12 mg/dL (ref 6–20)
CO2: 21 mmol/L — ABNORMAL LOW (ref 22–32)
Calcium: 8.5 mg/dL — ABNORMAL LOW (ref 8.9–10.3)
Chloride: 101 mmol/L (ref 98–111)
Creatinine, Ser: 1.07 mg/dL — ABNORMAL HIGH (ref 0.44–1.00)
GFR, Estimated: >60 mL/min (ref >60)
Glucose, Bld: 115 mg/dL — ABNORMAL HIGH (ref 70–99)
Potassium: 3.5 mmol/L (ref 3.5–5.1)
Sodium: 135 mmol/L (ref 135–145)
Total Bilirubin: 0.8 mg/dL (ref 0.0–1.2)
Total Protein: 7.3 g/dL (ref 6.5–8.1)

## 2023-08-23 LAB — PROTIME-INR
INR: 1.3 — ABNORMAL HIGH (ref 0.8–1.2)
Prothrombin Time: 16.1 s — ABNORMAL HIGH (ref 11.4–15.2)

## 2023-08-23 LAB — RESP PANEL BY RT-PCR (RSV, FLU A&B, COVID)  RVPGX2
Influenza A by PCR: NEGATIVE
Influenza B by PCR: NEGATIVE
Resp Syncytial Virus by PCR: NEGATIVE
SARS Coronavirus 2 by RT PCR: NEGATIVE

## 2023-08-23 LAB — I-STAT CG4 LACTIC ACID, ED
Lactic Acid, Venous: 2 mmol/L (ref 0.5–1.9)
Lactic Acid, Venous: 1 mmol/L (ref 0.5–1.9)

## 2023-08-23 MED ORDER — ESCITALOPRAM OXALATE 20 MG PO TABS
20.0000 mg | ORAL_TABLET | Freq: Every day | ORAL | Status: DC
  Administered 2023-08-23 – 2023-08-26 (×4): 20 mg via ORAL
  Filled 2023-08-23: qty 2
  Filled 2023-08-23: qty 1
  Filled 2023-08-23 (×2): qty 2

## 2023-08-23 MED ORDER — OXYCODONE HCL 5 MG PO TABS
5.0000 mg | ORAL_TABLET | ORAL | Status: DC | PRN
  Administered 2023-08-23: 5 mg via ORAL
  Filled 2023-08-23: qty 1

## 2023-08-23 MED ORDER — ACETAMINOPHEN 325 MG PO TABS
650.0000 mg | ORAL_TABLET | Freq: Four times a day (QID) | ORAL | Status: DC | PRN
  Administered 2023-08-24 – 2023-08-25 (×2): 650 mg via ORAL
  Filled 2023-08-23 (×2): qty 2

## 2023-08-23 MED ORDER — LACTATED RINGERS IV BOLUS
1000.0000 mL | Freq: Once | INTRAVENOUS | Status: AC
  Administered 2023-08-23: 1000 mL via INTRAVENOUS

## 2023-08-23 MED ORDER — LORAZEPAM 0.5 MG PO TABS
0.5000 mg | ORAL_TABLET | Freq: Three times a day (TID) | ORAL | Status: DC | PRN
  Administered 2023-08-24: 0.5 mg via ORAL
  Filled 2023-08-23 (×2): qty 1

## 2023-08-23 MED ORDER — BUDESONIDE 3 MG PO CPEP: 9.0000 mg | ORAL_CAPSULE | Freq: Every day | ORAL | Status: DC

## 2023-08-23 MED ORDER — ENOXAPARIN SODIUM 60 MG/0.6ML IJ SOSY
60.0000 mg | PREFILLED_SYRINGE | INTRAMUSCULAR | Status: DC
  Administered 2023-08-23 – 2023-08-26 (×4): 60 mg via SUBCUTANEOUS
  Filled 2023-08-23 (×5): qty 0.6

## 2023-08-23 MED ORDER — CARVEDILOL 3.125 MG PO TABS
3.1250 mg | ORAL_TABLET | Freq: Two times a day (BID) | ORAL | Status: DC
  Filled 2023-08-23: qty 1

## 2023-08-23 MED ORDER — DOCUSATE SODIUM 100 MG PO CAPS
100.0000 mg | ORAL_CAPSULE | Freq: Two times a day (BID) | ORAL | Status: DC
  Administered 2023-08-23 – 2023-08-26 (×2): 100 mg via ORAL
  Filled 2023-08-23 (×5): qty 1

## 2023-08-23 MED ORDER — ALBUTEROL SULFATE (2.5 MG/3ML) 0.083% IN NEBU: 2.5000 mg | INHALATION_SOLUTION | RESPIRATORY_TRACT | Status: DC | PRN

## 2023-08-23 MED ORDER — ACETAMINOPHEN 650 MG RE SUPP: 650.0000 mg | Freq: Four times a day (QID) | RECTAL | Status: DC | PRN

## 2023-08-23 MED ORDER — ENSURE ENLIVE PO LIQD
237.0000 mL | Freq: Two times a day (BID) | ORAL | Status: DC
  Administered 2023-08-24 – 2023-08-27 (×5): 237 mL via ORAL

## 2023-08-23 MED ORDER — ONDANSETRON HCL 4 MG/2ML IJ SOLN
4.0000 mg | Freq: Once | INTRAMUSCULAR | Status: AC
  Administered 2023-08-23: 4 mg via INTRAVENOUS
  Filled 2023-08-23: qty 2

## 2023-08-23 MED ORDER — ONDANSETRON HCL 4 MG/2ML IJ SOLN: 4.0000 mg | Freq: Four times a day (QID) | INTRAMUSCULAR | Status: DC | PRN

## 2023-08-23 MED ORDER — KETOROLAC TROMETHAMINE 30 MG/ML IJ SOLN
30.0000 mg | Freq: Once | INTRAMUSCULAR | Status: AC
  Administered 2023-08-23: 30 mg via INTRAVENOUS
  Filled 2023-08-23: qty 1

## 2023-08-23 MED ORDER — VANCOMYCIN HCL 1250 MG/250ML IV SOLN: 1250.0000 mg | INTRAVENOUS | Status: DC

## 2023-08-23 MED ORDER — SODIUM CHLORIDE 0.9 % IV SOLN
2.0000 g | Freq: Once | INTRAVENOUS | Status: AC
  Administered 2023-08-23: 2 g via INTRAVENOUS
  Filled 2023-08-23: qty 12.5

## 2023-08-23 MED ORDER — HYDROMORPHONE HCL 1 MG/ML IJ SOLN
0.5000 mg | INTRAMUSCULAR | Status: DC | PRN
  Administered 2023-08-24 (×2): 1 mg via INTRAVENOUS
  Filled 2023-08-23 (×2): qty 1

## 2023-08-23 MED ORDER — VANCOMYCIN HCL 1250 MG/250ML IV SOLN
1250.0000 mg | INTRAVENOUS | Status: DC
  Administered 2023-08-24 – 2023-08-25 (×2): 1250 mg via INTRAVENOUS
  Filled 2023-08-23 (×2): qty 250

## 2023-08-23 MED ORDER — ONDANSETRON HCL 4 MG PO TABS: 4.0000 mg | ORAL_TABLET | Freq: Four times a day (QID) | ORAL | Status: DC | PRN

## 2023-08-23 MED ORDER — DIPHENHYDRAMINE HCL 50 MG/ML IJ SOLN: 25.0000 mg | Freq: Four times a day (QID) | INTRAMUSCULAR | Status: DC | PRN

## 2023-08-23 MED ORDER — POLYETHYLENE GLYCOL 3350 17 G PO PACK: 17.0000 g | PACK | Freq: Every day | ORAL | Status: DC | PRN

## 2023-08-23 MED ORDER — TRAZODONE HCL 50 MG PO TABS: 25.0000 mg | ORAL_TABLET | Freq: Every evening | ORAL | Status: DC | PRN

## 2023-08-23 MED ORDER — DIPHENHYDRAMINE HCL 50 MG/ML IJ SOLN
25.0000 mg | Freq: Once | INTRAMUSCULAR | Status: AC
  Administered 2023-08-23: 25 mg via INTRAVENOUS
  Filled 2023-08-23: qty 1

## 2023-08-23 MED ORDER — METRONIDAZOLE 500 MG/100ML IV SOLN
500.0000 mg | Freq: Two times a day (BID) | INTRAVENOUS | Status: DC
  Administered 2023-08-23 – 2023-08-24 (×3): 500 mg via INTRAVENOUS
  Filled 2023-08-23 (×3): qty 100

## 2023-08-23 MED ORDER — FENTANYL CITRATE PF 50 MCG/ML IJ SOSY
25.0000 ug | PREFILLED_SYRINGE | Freq: Once | INTRAMUSCULAR | Status: DC
  Filled 2023-08-23: qty 1

## 2023-08-23 MED ORDER — SODIUM CHLORIDE 0.9 % IV SOLN
2.0000 g | Freq: Three times a day (TID) | INTRAVENOUS | Status: DC
  Administered 2023-08-23 – 2023-08-24 (×2): 2 g via INTRAVENOUS
  Filled 2023-08-23 (×2): qty 12.5

## 2023-08-23 MED ORDER — ACETAMINOPHEN 500 MG PO TABS
1000.0000 mg | ORAL_TABLET | Freq: Once | ORAL | Status: AC
  Administered 2023-08-23: 1000 mg via ORAL
  Filled 2023-08-23: qty 2

## 2023-08-23 MED ORDER — VANCOMYCIN HCL 2000 MG/400ML IV SOLN
2000.0000 mg | Freq: Once | INTRAVENOUS | Status: AC
  Administered 2023-08-23: 2000 mg via INTRAVENOUS
  Filled 2023-08-23: qty 400

## 2023-08-23 MED ORDER — CYCLOSPORINE 0.05 % OP EMUL
1.0000 [drp] | Freq: Two times a day (BID) | OPHTHALMIC | Status: DC
  Administered 2023-08-23 – 2023-08-27 (×8): 1 [drp] via OPHTHALMIC
  Filled 2023-08-23 (×8): qty 30

## 2023-08-23 MED ORDER — COLESTIPOL HCL 1 G PO TABS: 2.0000 g | ORAL_TABLET | Freq: Two times a day (BID) | ORAL | Status: DC

## 2023-08-23 MED ORDER — PANTOPRAZOLE SODIUM 40 MG PO TBEC
40.0000 mg | DELAYED_RELEASE_TABLET | Freq: Every day | ORAL | Status: DC
Start: 2023-08-23 — End: 2023-08-27
  Administered 2023-08-23 – 2023-08-27 (×5): 40 mg via ORAL
  Filled 2023-08-23 (×5): qty 1

## 2023-08-23 MED ORDER — IOHEXOL 350 MG/ML SOLN
100.0000 mL | Freq: Once | INTRAVENOUS | Status: AC | PRN
  Administered 2023-08-23: 100 mL via INTRAVENOUS

## 2023-08-23 NOTE — ED Notes (Signed)
 Pt's visitor came out of the room to inform this writer that pt was starting to feel a tightness in her throat that feels like a "lump." This writer paused both antibiotics hanging and attempted to inform ED provider. Multiple attempts made to contact same. No new orders at this time.

## 2023-08-23 NOTE — Progress Notes (Addendum)
 Pharmacy Antibiotic Note  Diamond Marshall is a 53 y.o. female admitted on 08/23/2023. Patient is one month out from bilateral skin sparing mastectomies for stage IV breast cancer with history of large right seroma now decompressed through the inferior portion of the incision, skin necrosis on the left with open wound. Pharmacy has been consulted for vancomycin and cefepime dosing.  Note possible reaction to antibiotics. Benadryl given. Per my discussion with nurse, report from earlier in day was that there was some redness of skin and patient reported tightness in throat that feels like "lump" but that was normal for her. Vancomycin infusion rate was decreased.  Plan: -Vancomycin 2 g given, continue with 1250 mg IV q24h -Cefepime 2 g IV q8h -Discussed with MD - Benadryl PRN infusion related reaction -Continue to follow renal function, cultures and clinical progress for dose adjustments and de-escalation as indicated     Temp (24hrs), Avg:101.6 F (38.7 C), Min:99.5 F (37.5 C), Max:103.2 F (39.6 C)  Recent Labs  Lab 08/23/23 0940 08/23/23 1000 08/23/23 1420  WBC 18.1*  --   --   CREATININE 1.07*  --   --   LATICACIDVEN  --  2.0* 1.0    CrCl cannot be calculated (Unknown ideal weight.).    Allergies  Allergen Reactions   Misc. Sulfonamide Containing Compounds Rash and Itching   Phentermine     Became suicidal on the medication   Bee Venom Swelling   Lactose Intolerance (Gi) Diarrhea   Peanut Allergen Powder-Dnfp Other (See Comments)    Migraine   Topiramate Other (See Comments)    dizziness   Septra [Sulfamethoxazole-Trimethoprim] Rash   Sulfa Antibiotics Rash   Tape Rash    Antimicrobials this admission: Vancomycin 4/3 >> Cefepime 4/3 >> Metronidazole 4/3 >>  Dose adjustments this admission: NA  Microbiology results: 4/3 BCx: pending 4/3 Wound (left breast): pending  Thank you for allowing pharmacy to be a part of this patient's care.  Pricilla Riffle, PharmD,  BCPS Clinical Pharmacist 08/23/2023 4:17 PM

## 2023-08-23 NOTE — ED Provider Notes (Signed)
 Signed out to field hospitalists call for admission when they call back.   Discussed pt - they will admit.      Cathren Laine, MD 08/23/23 [phone]

## 2023-08-23 NOTE — ED Notes (Signed)
 Dr. Dalene Seltzer notified of pt's condition and potential Code Sepsis.

## 2023-08-23 NOTE — ED Triage Notes (Signed)
 Pt states that she had double mastectomy on 3/6. Since then pt has been dealing with bilateral non-healing wounds. Pt states that overnight she began running a fever of 103.8. Attempted to take Tylenol at 0600 but immediately had bout of emesis. Pt also states that overnight her R chest filled with fluid and then "burst" overnight.

## 2023-08-23 NOTE — H&P (Signed)
 History and Physical  Diamond Marshall ZOX:[id number] DOB: [birth year] DOA: 08/23/2023  PCP: Mechele Claude, MD   Chief Complaint: Mastectomy wounds  HPI: Diamond Marshall is a 53 y.o. female with medical history significant for hypertension, hypothyroidism, bilateral stage IV inflammatory breast cancer who is now status post bilateral simple mastectomy with lymph node dissection on 07/26/2023 and being admitted due to concern for sepsis from possible mastectomy wound infection.  States that in the last week or so, she has had increasing swelling on the right side which was felt to be a seroma, and drained in the surgical clinic earlier this week.  She also has continued significant pain and tenderness on the right side, however not much pain on the left side, there is an open wound there.  Presented to the emergency department this morning due to fever of 103.8 at home, as well as emesis.  States that the right sided fluid collection burst overnight with a large amount of clear red fluid.  Workup in the emergency department shows evidence of sepsis as below, she had empiric IV antibiotics.  She has been seen by her surgeon Dr. Luisa Hart, who recommends hospitalist admission with empiric IV antibiotics, and wound VAC.  Review of Systems: Please see HPI for pertinent positives and negatives. A complete 10 system review of systems are otherwise negative.  Past Medical History:  Diagnosis Date   Abdominal hernia    Anemia    b12 deficiency   Anxiety    Arthritis    B12 deficiency    Back pain    Cancer (HCC)    breast cancer   Depression    Family history of adverse reaction to anesthesia    mother vomits after anesthesia   Gallbladder problem    GERD (gastroesophageal reflux disease)    History of hiatal hernia    "small" per patient   Hypertension    Hypothyroidism    Joint pain    Knee pain    Palpitation    "when iron is low"   Pernicious anemia    SOB (shortness of breath)    Thyroid  disease    hypothyroidism   Vitamin D deficiency    Past Surgical History:  Procedure Laterality Date   BREAST BIOPSY Left 06/09/2022   [place] LT BREAST BX W LOC DEV 1ST LESION IMG BX SPEC US GUIDE 06/09/2022 [facility]   CESAREAN SECTION  1999   Dilatation and currettagement     for miscarriage 18 years ago   PORTACATH PLACEMENT Right 07/05/2022   Procedure: INSERTION PORT-A-CATH;  Surgeon: Harriette Bouillon, MD;  Location: [facility] OR;  Service: General;  Laterality: Right;   SIMPLE MASTECTOMY WITH AXILLARY SENTINEL NODE BIOPSY Bilateral 07/26/2023   Procedure: LEFT SKIN REDUCING MASTECTOMY, RIGHT SKIN SPARING, RISK REDUCING MASTECTOMY;  Surgeon: Harriette Bouillon, MD;  Location: [facility] OR;  Service: General;  Laterality: Bilateral;  PEC BLOCK   Social History:  reports that she has never smoked. She has never used smokeless tobacco. She reports that she does not drink alcohol and does not use drugs.  Allergies  Allergen Reactions   Misc. Sulfonamide Containing Compounds Rash and Itching   Phentermine     Became suicidal on the medication   Bee Venom Swelling   Lactose Intolerance (Gi) Diarrhea   Peanut Allergen Powder-Dnfp Other (See Comments)    Migraine   Topiramate Other (See Comments)    dizziness   Septra [Sulfamethoxazole-Trimethoprim] Rash   Sulfa Antibiotics Rash  Tape Rash    Family History  Problem Relation Age of Onset   Hypertension Mother    Diabetes Mother    High Cholesterol Mother    Thyroid disease Mother    Depression Mother    Anxiety disorder Mother    Obesity Mother    CVA Father        hemorrhagic   Ovarian cancer Maternal Aunt 62   Colon cancer Maternal Aunt    Breast cancer Cousin 29       maternal first cousin, reports negative genetic testing     Prior to Admission medications   Medication Sig Start Date End Date Taking? Authorizing Provider  acetaminophen (TYLENOL) 325 MG tablet Take 2 tablets (650 mg total) by mouth every 6 (six) hours as needed  for mild pain (pain score 1-3) (or Fever >/= 101). 07/27/23   Cornett, Maisie Fus, MD  budesonide (ENTOCORT EC) 3 MG 24 hr capsule TAKE THREE CAPSULES BY MOUTH DAILY 06/13/23   Rachel Moulds, MD  carvedilol (COREG) 3.125 MG tablet TAKE ONE TABLET TWICE DAILY WITH MEAL(S) 07/26/23   Rachel Moulds, MD  colestipol (COLESTID) 1 g tablet Take 2 tablets (2 g total) by mouth 2 (two) times daily. 07/06/23   Unk Lightning, PA  cyanocobalamin (VITAMIN B12) 1000 MCG/ML injection INJECT 1 ML INTRAMUSCULARLY EVERY 15 DAYS Patient taking differently: Inject 1,000 mcg into the skin every 30 (thirty) days. 05/18/23   Serena Croissant, MD  cycloSPORINE (RESTASIS) 0.05 % ophthalmic emulsion Place 1 drop into both eyes 2 (two) times daily. 06/15/23   Rachel Moulds, MD  diphenoxylate-atropine (LOMOTIL) 2.5-0.025 MG tablet TAKE TWO TABLETS EVERY 6 HOURS AS NEEDED FOR DIARRHEA 04/17/23   Rachel Moulds, MD  EPINEPHrine 0.3 mg/0.3 mL IJ SOAJ injection Inject 0.3 mg into the muscle as needed for anaphylaxis. 12/16/21   Gwenlyn Fudge, FNP  escitalopram (LEXAPRO) 20 MG tablet Take 1 tablet (20 mg total) by mouth daily. 08/06/23   Mechele Claude, MD  hydrocortisone (ANUSOL-HC) 2.5 % rectal cream Place 1 Application rectally 2 (two) times daily. Patient taking differently: Place 1 Application rectally 2 (two) times daily as needed for hemorrhoids. 01/09/23   Garlon Hatchet, PA-C  LORazepam (ATIVAN) 0.5 MG tablet Take 1 tablet (0.5 mg total) by mouth every 8 (eight) hours as needed for anxiety. 06/22/22   Rachel Moulds, MD  methocarbamol (ROBAXIN) 500 MG tablet Take 1 tablet (500 mg total) by mouth every 6 (six) hours as needed for muscle spasms. 07/27/23   Cornett, Maisie Fus, MD  oxyCODONE (OXY IR/ROXICODONE) 5 MG immediate release tablet Take 1 tablet (5 mg total) by mouth every 6 (six) hours as needed for severe pain (pain score 7-10). 07/27/23   Cornett, Maisie Fus, MD  pantoprazole (PROTONIX) 40 MG tablet Take 1 tablet (40 mg total)  by mouth daily. 08/06/23   Mechele Claude, MD  potassium chloride (KLOR-CON) 10 MEQ tablet TAKE TWO TABLETS DAILY AS DIRECTED 06/13/23   Loa Socks, NP    Physical Exam: BP 107/68 (BP Location: Left Arm)   Pulse 93   Temp 99.5 F (37.5 C) (Oral)   Resp 17   LMP 10/13/2019 (Approximate)   SpO2 97%  General:  Alert, oriented, calm, in no acute distress  Eyes: EOMI, clear conjuctivae, white sclerea Neck: supple, no masses, trachea mildline  Cardiovascular: RRR, no murmurs or rubs, no peripheral edema  Respiratory: clear to auscultation bilaterally, no wheezes, no crackles  Abdomen: soft, nontender, nondistended, normal bowel tones heard  Skin: dry, no rashes, bilateral wounds are dressed, left-sided wound was just packed and not examined by me personally. Musculoskeletal: no joint effusions, normal range of motion  Psychiatric: appropriate affect, normal speech  Neurologic: extraocular muscles intact, clear speech, moving all extremities with intact sensorium         Labs on Admission:  Basic Metabolic Panel: Recent Labs  Lab 08/23/23 0940  NA 135  K 3.5  CL 101  CO2 21*  GLUCOSE 115*  BUN 12  CREATININE 1.07*  CALCIUM 8.5*   Liver Function Tests: Recent Labs  Lab 08/23/23 0940  AST 27  ALT 11  ALKPHOS 65  BILITOT 0.8  PROT 7.3  ALBUMIN 2.8*   No results for input(s): "LIPASE", "AMYLASE" in the last 168 hours. No results for input(s): "AMMONIA" in the last 168 hours. CBC: Recent Labs  Lab 08/23/23 0940  WBC 18.1*  NEUTROABS 16.1*  HGB 9.7*  HCT 31.3*  MCV 98.1  PLT 286   Cardiac Enzymes: No results for input(s): "CKTOTAL", "CKMB", "CKMBINDEX", "TROPONINI" in the last 168 hours. BNP (last 3 results) Recent Labs    06/01/23 1437  BNP 32.1    ProBNP (last 3 results) No results for input(s): "PROBNP" in the last 8760 hours.  CBG: No results for input(s): "GLUCAP" in the last 168 hours.  Radiological Exams on Admission: CT ABDOMEN  PELVIS W CONTRAST Result Date: 08/23/2023 CLINICAL DATA:  Left upper quadrant pain. EXAM: CT ABDOMEN AND PELVIS WITH CONTRAST TECHNIQUE: Multidetector CT imaging of the abdomen and pelvis was performed using the standard protocol following bolus administration of intravenous contrast. RADIATION DOSE REDUCTION: This exam was performed according to the departmental dose-optimization program which includes automated exposure control, adjustment of the mA and/or kV according to patient size and/or use of iterative reconstruction technique. CONTRAST:  OMNIPAQUE IOHEXOL 350 MG/ML SOLN COMPARISON:  CTA chest, abdomen, and pelvis dated 05/22/2023. FINDINGS: Lower chest: Please refer to the same-day CT chest for description of intrathoracic findings. Hepatobiliary: No focal liver abnormality is seen. No gallstones, gallbladder wall thickening, or biliary dilatation. Pancreas: Unremarkable. No pancreatic ductal dilatation or surrounding inflammatory changes. Spleen: Splenomegaly measuring up to 14.6 cm in craniocaudal dimension. No focal lesion. Adrenals/Urinary Tract: Adrenal glands are unremarkable. Kidneys are normal, without renal calculi, focal lesion, or hydronephrosis. Bladder is unremarkable. Stomach/Bowel: Stomach is within normal limits. Appendix appears normal. No evidence of bowel wall thickening, distention, or inflammatory changes. Sigmoid colonic diverticulosis without evidence of acute diverticulitis. Vascular/Lymphatic: Abdominal aorta is normal in caliber with mild aortic atherosclerosis. No enlarged abdominopelvic lymph nodes. Reproductive: Uterus and bilateral adnexa are unremarkable. Other: No abdominopelvic ascites.  No intraperitoneal free air. Partially visualized area of loculated fluid density along the right lateral chest wall (series 4, image 99), with surrounding surgical clips, likely reflects a postsurgical fluid collection related to interval bilateral mastectomies. This region was not  included within the field of view on the same-day CT chest. Musculoskeletal: No acute osseous abnormality. No suspicious osseous lesion. IMPRESSION: 1. No acute localizing findings in the abdomen or pelvis. 2. Partially visualized fluid collection along the right lateral chest wall, with surrounding surgical clips, likely reflects a postsurgical fluid collection of indeterminate sterility related to interval bilateral mastectomies. This region was not included within the field of view on the same-day CT chest. 3. Sigmoid colonic diverticulosis without evidence of acute diverticulitis. 4. Splenomegaly. Electronically Signed   By: Hart Robinsons M.D.   On: 08/23/2023 14:47   CT Angio  Chest PE W and/or Wo Contrast Result Date: 08/23/2023 CLINICAL DATA:  Pulmonary embolism (PE) suspected, high prob EXAM: CT ANGIOGRAPHY CHEST WITH CONTRAST TECHNIQUE: Multidetector CT imaging of the chest was performed using the standard protocol during bolus administration of intravenous contrast. Multiplanar CT image reconstructions and MIPs were obtained to evaluate the vascular anatomy. RADIATION DOSE REDUCTION: This exam was performed according to the departmental dose-optimization program which includes automated exposure control, adjustment of the mA and/or kV according to patient size and/or use of iterative reconstruction technique. CONTRAST:  OMNIPAQUE IOHEXOL 350 MG/ML SOLN COMPARISON:  CTA chest dated 05/22/2023. FINDINGS: Cardiovascular: Satisfactory opacification of the pulmonary arteries to the segmental level. No evidence of pulmonary embolism. Normal heart size. No pericardial effusion. Thoracic aorta is normal in caliber. Catheter tip again noted at the superior cavoatrial junction. Mediastinum/Nodes: No enlarged mediastinal, hilar, or axillary lymph nodes. Similar 12 mm right-sided thyroid nodule. Trachea and esophagus demonstrate no significant findings. Lungs/Pleura: Bilateral mosaic attenuation. No dense  focal consolidation. Minimal bibasilar atelectasis. No pleural effusion or pneumothorax. No suspicious pulmonary nodule identified. Upper Abdomen: No acute abnormality. Musculoskeletal: Status post interval bilateral mastectomies. Large left chest wall wound extending along the mastectomy site. Regions of curvilinear air extending along the chest left chest wall from the level of the open wound likely contiguous with overlying skin flaps. This large wound/soft tissue defect measures approximately 12.8 cm transverse by 10.8 cm craniocaudal. No definite soft tissue gas is identified. There is surrounding associated stranding with overlying cutaneous thickening. Ill-defined fluid extending along subcutaneous tissues of the right chest wall mastectomy site, overlying the right pectoralis major muscle. No sizable loculated collection identified. There is surrounding stranding and overlying cutaneous thickening. Scattered surgical clips. No acute osseous abnormality.  No suspicious osseous lesion. Review of the MIP images confirms the above findings. IMPRESSION: 1. No evidence of pulmonary embolism. 2. Status post interval bilateral mastectomies. Large left chest wall wound and soft tissue defect extending along the mastectomy site. No discrete fluid collection identified. Ill-defined fluid extending along subcutaneous tissues of the right chest wall mastectomy site, overlying the right pectoralis major muscle. No sizable loculated collection identified. Cellulitis can not be excluded. 3. Bilateral mosaic attenuation could reflect air trapping secondary to a small airways infectious/inflammatory etiology. 4. Similar right sided thyroid nodule. Consider further evaluation with nonemergent ultrasound. Electronically Signed   By: Hart Robinsons M.D.   On: 08/23/2023 14:37   DG Chest 2 View Result Date: 08/23/2023 CLINICAL DATA:  Sepsis.  Metastatic breast carcinoma. EXAM: CHEST - 2 VIEW COMPARISON:  07/09/2023 FINDINGS:  The heart size and mediastinal contours are within normal limits. Right-sided Port-A-Cath remains in appropriate position. Both lungs are clear. The visualized skeletal structures are unremarkable. IMPRESSION: No active cardiopulmonary disease. Electronically Signed   By: Danae Orleans M.D.   On: 08/23/2023 11:25   Assessment/Plan Diamond Marshall is a 53 y.o. female with medical history significant for hypertension, hypothyroidism, bilateral stage IV inflammatory breast cancer who is now status post bilateral simple mastectomy with lymph node dissection on 07/26/2023 and being admitted due to concern for sepsis from possible mastectomy wound infection.  Sepsis-meeting criteria with fever, leukocytosis, source is likely left-sided postsurgical wound status post bilateral mastectomy on 07/26/2023.  Endorgan dysfunction with lactate 2.0.  She is hemodynamically stable, though hypotensive. -Inpatient admission -Follow-up blood cultures -Continue IV fluids -Continue empiric IV cefepime, IV vancomycin, IV Flagyl -Pain and nausea control as needed, with bowel regimen  Left chest wall wound-without  evidence of obvious infection -Empiric IV antibiotics as above -Wound care consult for wound VAC -Appreciate general surgery involvement and recommendations  Pretension-continue home Coreg, with holding parameters  Depression-Lexapro  GERD/Protonix  DVT prophylaxis: Lovenox     Code Status: Full Code  Consults called: General Surgery Dr. Luisa Hart, her oncologist Dr. Al Pimple was added to inpatient treatment team  Admission status: The appropriate patient status for this patient is INPATIENT. Inpatient status is judged to be reasonable and necessary in order to provide the required intensity of service to ensure the patient's safety. The patient's presenting symptoms, physical exam findings, and initial radiographic and laboratory data in the context of their chronic comorbidities is felt to place them at high  risk for further clinical deterioration. Furthermore, it is not anticipated that the patient will be medically stable for discharge from the hospital within 2 midnights of admission.    I certify that at the point of admission it is my clinical judgment that the patient will require inpatient hospital care spanning beyond 2 midnights from the point of admission due to high intensity of service, high risk for further deterioration and high frequency of surveillance required  Time spent: 59 minutes  Marc Leichter Sharlette Dense MD [facility] Pager [phone]  If 7PM-7AM, please contact night-coverage [email or web address] Password [email or web address]  08/23/2023, 4:05 PM

## 2023-08-23 NOTE — Progress Notes (Deleted)
 Patient Care Team: Mechele Claude, MD as PCP - General (Family Medicine) Harriette Bouillon, MD as Consulting Physician (General Surgery) Rachel Moulds, MD as Consulting Physician (Hematology and Oncology) Lonie Peak, MD as Attending Physician (Radiation Oncology) Pershing Proud, RN as Oncology Nurse Navigator Donnelly Angelica, RN as Oncology Nurse Navigator   SUMMARY OF ONCOLOGIC HISTORY: Oncology History  Malignant neoplasm of upper-inner quadrant of left breast in female, estrogen receptor positive (HCC)  06/06/2022 Mammogram   Diagnostic mammogram given palpable mass showed suspicious spiculated left breast mass with surrounding nodularity at 9 o'clock position.  This measures up to 3.1 cm sonographically however due to deep location and patient body habitus the mammographic measurement of 5.8 cm is felt to be more accurate.  At least 2 abnormal lymph nodes in the left axilla.  Possible abnormal palpable left supraclavicular lymph node.   06/06/2022 Breast US   Breast ultrasound confirmed these findings.   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  Prognostic showed ER 65% weak to strong staining, PR 15% weak to moderate staining, Ki-67 of 50% and HER2 positive by IHC at 3+    Genetic Testing   [facility] Custom Panel+RNA was Negative. Report date is 06/28/2022.   The Custom Hereditary Cancers Panel offered by [facility] includes sequencing and/or deletion duplication testing of the following 43 genes: APC, ATM, AXIN2, BAP1, BARD1, BMPR1A, BRCA1, BRCA2, BRIP1, CDH1, CDK4, CDKN2A (p14ARF and p16INK4a only), CHEK2, CTNNA1, EPCAM (Deletion/duplication testing only), FH, GREM1 (promoter region duplication testing only), HOXB13, KIT, MBD4, MEN1, MLH1, MSH2, MSH3, MSH6, MUTYH, NF1, NHTL1, PALB2, PDGFRA, PMS2, POLD1, POLE, PTEN,  RAD51C, RAD51D, SMAD4, SMARCA4. STK11, TP53, TSC1, TSC2, and VHL.    06/29/2022 PET scan   IMPRESSION: 1. Hypermetabolic left breast mass, compatible with primary breast malignancy. 2. Hypermetabolic left cervical, axillary, subpectoral, supraclavicular and prevascular mediastinal lymph nodes, compatible with nodal metastatic disease. 3. No evidence of metastatic disease in the abdomen or pelvis. 4. Aortic Atherosclerosis (ICD10-I70.0).     Electronically Signed   By: Allegra Lai M.D.   On: 06/29/2022 09:32   07/11/2022 - 01/16/2023 Chemotherapy   Patient is on Treatment Plan : BREAST DOCEtaxel + Trastuzumab + Pertuzumab (THP) q21d x 8 cycles / Trastuzumab + Pertuzumab q21d x 4 cycles     02/01/2023 - 06/29/2023 Chemotherapy   Patient is on Treatment Plan : BREAST METASTATIC Fam-Trastuzumab Deruxtecan-nxki (Enhertu) (5.4) q21d     09/06/2023 -  Chemotherapy   Patient is on Treatment Plan : BREAST ADO-Trastuzumab Emtansine (Kadcyla) q21d       CHIEF COMPLIANT: S/p bilateral mastectomy  Discussed the use of AI scribe software for clinical note transcription with the patient, who gave verbal consent to proceed.  History of Present Illness    The patient presents with postoperative follow-up after recent breast surgery. She is accompanied by her caregiver.  She is experiencing fatigue and sleepiness since Monday, significantly impacting her daily activities. She has not taken any pain medication since Sunday, despite some pain at the surgical site, specifically in her breasts. The pain is described as manageable.  One of the surgical drains appears to be leaking, although it has not been collecting much fluid. She emptied it at 7 o'clock and notes that it looks painful. No fevers, chills, or signs of infection such as purulent discharge, although one  side has felt warm a few times.  She has experienced some phantom itching and muscle spasms post-surgery. She has not had significant  issues with her stomach and is no longer taking Bumex due to anesthesia and medication interactions. She attributes some previous stomach issues to lactose intolerance and has adjusted her diet accordingly, reducing dairy intake.  Her mother passed away unexpectedly four days before her surgery, delaying the funeral arrangements until her recovery progresses. Her father is concerned about her immunosuppressed state and plans to limit close contact during the funeral to protect her health.  ALLERGIES:  is allergic to misc. sulfonamide containing compounds, phentermine, bee venom, lactose intolerance (gi), peanut allergen powder-dnfp, topiramate, septra [sulfamethoxazole-trimethoprim], sulfa antibiotics, and tape.  MEDICATIONS:  Current Outpatient Medications  Medication Sig Dispense Refill   acetaminophen (TYLENOL) 325 MG tablet Take 2 tablets (650 mg total) by mouth every 6 (six) hours as needed for mild pain (pain score 1-3) (or Fever >/= 101). 60 tablet 0   budesonide (ENTOCORT EC) 3 MG 24 hr capsule TAKE THREE CAPSULES BY MOUTH DAILY 90 capsule 1   carvedilol (COREG) 3.125 MG tablet TAKE ONE TABLET TWICE DAILY WITH MEAL(S) 60 tablet 0   colestipol (COLESTID) 1 g tablet Take 2 tablets (2 g total) by mouth 2 (two) times daily. 60 tablet 3   cyanocobalamin (VITAMIN B12) 1000 MCG/ML injection INJECT 1 ML INTRAMUSCULARLY EVERY 15 DAYS (Patient taking differently: Inject 1,000 mcg into the skin every 30 (thirty) days.) 30 mL 3   cycloSPORINE (RESTASIS) 0.05 % ophthalmic emulsion Place 1 drop into both eyes 2 (two) times daily. 5.5 mL 1   diphenoxylate-atropine (LOMOTIL) 2.5-0.025 MG tablet TAKE TWO TABLETS EVERY 6 HOURS AS NEEDED FOR DIARRHEA 60 tablet 1   EPINEPHrine 0.3 mg/0.3 mL IJ SOAJ injection Inject 0.3 mg into the muscle as needed for anaphylaxis. 2 each 0   escitalopram (LEXAPRO) 20 MG tablet Take 1 tablet (20 mg total) by mouth daily. 90 tablet 3   hydrocortisone (ANUSOL-HC) 2.5 % rectal  cream Place 1 Application rectally 2 (two) times daily. (Patient taking differently: Place 1 Application rectally 2 (two) times daily as needed for hemorrhoids.) 30 g 2   LORazepam (ATIVAN) 0.5 MG tablet Take 1 tablet (0.5 mg total) by mouth every 8 (eight) hours as needed for anxiety. 30 tablet 1   methocarbamol (ROBAXIN) 500 MG tablet Take 1 tablet (500 mg total) by mouth every 6 (six) hours as needed for muscle spasms. 20 tablet 0   oxyCODONE (OXY IR/ROXICODONE) 5 MG immediate release tablet Take 1 tablet (5 mg total) by mouth every 6 (six) hours as needed for severe pain (pain score 7-10). 15 tablet 0   pantoprazole (PROTONIX) 40 MG tablet Take 1 tablet (40 mg total) by mouth daily. 90 tablet 3   potassium chloride (KLOR-CON) 10 MEQ tablet TAKE TWO TABLETS DAILY AS DIRECTED 60 tablet 0   No current facility-administered medications for this visit.    PHYSICAL EXAMINATION: ECOG PERFORMANCE STATUS: 1 - Symptomatic but completely ambulatory  There were no vitals filed for this visit.  There were no vitals filed for this visit.    NECK: No abnormalities noted upon palpation. Breast:  s.p bilateral mastectomy, healing as expected. No concern for infection, drain in place.  LABORATORY DATA:  I have reviewed the data as listed    Latest Ref Rng & Units 07/23/2023    9:30 AM 07/09/2023   11:09 PM 06/29/2023   11:24 AM  CMP  Glucose 70 - 99 mg/dL 96  213  95   BUN 6 - 20 mg/dL 5  10  8    Creatinine 0.44 - 1.00 mg/dL 0.86  5.78  4.69   Sodium 135 - 145 mmol/L 145  138  144   Potassium 3.5 - 5.1 mmol/L 3.7  3.4  3.7   Chloride 98 - 111 mmol/L 111  104  110   CO2 22 - 32 mmol/L 26  24  30    Calcium 8.9 - 10.3 mg/dL 8.4  8.1  8.7   Total Protein 6.5 - 8.1 g/dL  6.4  5.9   Total Bilirubin 0.0 - 1.2 mg/dL  1.4  0.6   Alkaline Phos 38 - 126 U/L  90  93   AST 15 - 41 U/L  47  29   ALT 0 - 44 U/L  26  21     Lab Results  Component Value Date   WBC 5.4 07/23/2023   HGB 8.8 (L) 07/23/2023    HCT 26.7 (L) 07/23/2023   MCV 107.2 (H) 07/23/2023   PLT 415 (H) 07/23/2023   NEUTROABS 2.8 07/09/2023    ASSESSMENT & PLAN:  Malignant neoplasm of upper-inner quadrant of left breast in female, 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  (Left breast mass, left cervical, axillary, subpectoral, supraclavicular and prevascular mediastinal lymph nodes, no metastasis in the abdomen)  07/11/2022-01/16/2023: THP followed by [facility] maintenance Started Enhertu second line 02/01/2023, now s/p bilateral mastectomy and no evidence of disease.  Postoperative care following breast surgery Experiencing fatigue, somnolence, and pain at the surgical site. Drain not collecting much fluid, no signs of infection or excessive pain. - Monitor for infection or increased pain. - Ensure adequate analgesia. - Monitor drainage, contact provider if concerns arise. - Plan for drain removal once drainage decreases. - Schedule follow-up in three weeks.  Breast cancer No evidence of cancer in bilateral mastectomy specimen. Kadcyla to be initiated post-healing.  - Educated on Kadcyla's mechanism and side effects. - Monitor for side effects during treatment. - Continue blood work monitoring.  Lactose intolerance Manages lactose intolerance with reduced dairy intake and lactase supplements, improving symptoms. - Continue avoiding lactose-containing foods and use lactase supplements. - Monitor gastrointestinal symptoms and adjust diet.  Follow-up RTC as scheduled.  No orders of the defined types were placed in this encounter.  The patient has a good understanding of the overall plan. she agrees with it. she will call with any problems that may develop before the next visit here. Total time spent: 30 mins including face to face time and time spent for planning, charting and co-ordination of care   Rachel Moulds, MD 08/23/23

## 2023-08-23 NOTE — Progress Notes (Signed)
 ED Pharmacy Antibiotic Sign Off An antibiotic consult was received from an ED provider for vancomycin, cefepime per pharmacy dosing for wound infection, sepsis. A chart review was completed to assess appropriateness.   08/06/23 Weight 274 lbs (124.5 kg)   The following one time order(s) were placed:  Vancomycin 2g  Cefepime 2g  Further antibiotic and/or antibiotic pharmacy consults should be ordered by the admitting provider if indicated.   Thank you for allowing pharmacy to be a part of this patient's care.   Lynann Beaver PharmD, BCPS [facility] main pharmacy [phone] 08/23/2023 9:47 AM

## 2023-08-23 NOTE — ED Provider Notes (Signed)
 [place] [facility]ENC[facility] DEPARTMENT AT [facility] Provider Note   CSN: [id number] Arrival date & time: 08/23/23  4132     History {Add pertinent medical, surgical, social history, OB history to HPI:1} Chief Complaint  Patient presents with   Fever   Wound Infection    Diamond Marshall is a 53 y.o. female.  HPI     Wound dehisence over weekend Monday saw Cornett 2 days of fever, increasing right breast pain.  Home Medications Prior to Admission medications   Medication Sig Start Date End Date Taking? Authorizing Provider  acetaminophen (TYLENOL) 325 MG tablet Take 2 tablets (650 mg total) by mouth every 6 (six) hours as needed for mild pain (pain score 1-3) (or Fever >/= 101). 07/27/23   Cornett, Maisie Fus, MD  budesonide (ENTOCORT EC) 3 MG 24 hr capsule TAKE THREE CAPSULES BY MOUTH DAILY 06/13/23   Rachel Moulds, MD  carvedilol (COREG) 3.125 MG tablet TAKE ONE TABLET TWICE DAILY WITH MEAL(S) 07/26/23   Rachel Moulds, MD  colestipol (COLESTID) 1 g tablet Take 2 tablets (2 g total) by mouth 2 (two) times daily. 07/06/23   Unk Lightning, PA  cyanocobalamin (VITAMIN B12) 1000 MCG/ML injection INJECT 1 ML INTRAMUSCULARLY EVERY 15 DAYS Patient taking differently: Inject 1,000 mcg into the skin every 30 (thirty) days. 05/18/23   Serena Croissant, MD  cycloSPORINE (RESTASIS) 0.05 % ophthalmic emulsion Place 1 drop into both eyes 2 (two) times daily. 06/15/23   Rachel Moulds, MD  diphenoxylate-atropine (LOMOTIL) 2.5-0.025 MG tablet TAKE TWO TABLETS EVERY 6 HOURS AS NEEDED FOR DIARRHEA 04/17/23   Rachel Moulds, MD  EPINEPHrine 0.3 mg/0.3 mL IJ SOAJ injection Inject 0.3 mg into the muscle as needed for anaphylaxis. 12/16/21   Gwenlyn Fudge, FNP  escitalopram (LEXAPRO) 20 MG tablet Take 1 tablet (20 mg total) by mouth daily. 08/06/23   Mechele Claude, MD  hydrocortisone (ANUSOL-HC) 2.5 % rectal cream Place 1 Application rectally 2 (two) times daily. Patient taking differently:  Place 1 Application rectally 2 (two) times daily as needed for hemorrhoids. 01/09/23   Garlon Hatchet, PA-C  LORazepam (ATIVAN) 0.5 MG tablet Take 1 tablet (0.5 mg total) by mouth every 8 (eight) hours as needed for anxiety. 06/22/22   Rachel Moulds, MD  methocarbamol (ROBAXIN) 500 MG tablet Take 1 tablet (500 mg total) by mouth every 6 (six) hours as needed for muscle spasms. 07/27/23   Cornett, Maisie Fus, MD  oxyCODONE (OXY IR/ROXICODONE) 5 MG immediate release tablet Take 1 tablet (5 mg total) by mouth every 6 (six) hours as needed for severe pain (pain score 7-10). 07/27/23   Cornett, Maisie Fus, MD  pantoprazole (PROTONIX) 40 MG tablet Take 1 tablet (40 mg total) by mouth daily. 08/06/23   Mechele Claude, MD  potassium chloride (KLOR-CON) 10 MEQ tablet TAKE TWO TABLETS DAILY AS DIRECTED 06/13/23   Loa Socks, NP      Allergies    Misc. sulfonamide containing compounds, Phentermine, Bee venom, Lactose intolerance (gi), Peanut allergen powder-dnfp, Topiramate, Septra [sulfamethoxazole-trimethoprim], Sulfa antibiotics, and Tape    Review of Systems   Review of Systems  Physical Exam Updated Vital Signs BP 107/68 (BP Location: Left Arm)   Pulse 93   Temp 99.5 F (37.5 C) (Oral)   Resp 17   LMP 10/13/2019 (Approximate)   SpO2 97%  Physical Exam  ED Results / Procedures / Treatments   Labs (all labs ordered are listed, but only abnormal results are displayed) Labs Reviewed  COMPREHENSIVE  METABOLIC PANEL WITH GFR - Abnormal; Notable for the following components:      Result Value   CO2 21 (*)    Glucose, Bld 115 (*)    Creatinine, Ser 1.07 (*)    Calcium 8.5 (*)    Albumin 2.8 (*)    All other components within normal limits  CBC WITH DIFFERENTIAL/PLATELET - Abnormal; Notable for the following components:   WBC 18.1 (*)    RBC 3.19 (*)    Hemoglobin 9.7 (*)    HCT 31.3 (*)    RDW 16.5 (*)    Neutro Abs 16.1 (*)    Abs Immature Granulocytes 0.11 (*)    All other components  within normal limits  PROTIME-INR - Abnormal; Notable for the following components:   Prothrombin Time 16.1 (*)    INR 1.3 (*)    All other components within normal limits  I-STAT CG4 LACTIC ACID, ED - Abnormal; Notable for the following components:   Lactic Acid, Venous 2.0 (*)    All other components within normal limits  CULTURE, BLOOD (ROUTINE X 2)  RESP PANEL BY RT-PCR (RSV, FLU A&B, COVID)  RVPGX2  CULTURE, BLOOD (ROUTINE X 2)  AEROBIC CULTURE W GRAM STAIN (SUPERFICIAL SPECIMEN)  URINALYSIS, W/ REFLEX TO CULTURE (INFECTION SUSPECTED)  I-STAT CG4 LACTIC ACID, ED    EKG None  Radiology CT ABDOMEN PELVIS W CONTRAST Result Date: 08/23/2023 CLINICAL DATA:  Left upper quadrant pain. EXAM: CT ABDOMEN AND PELVIS WITH CONTRAST TECHNIQUE: Multidetector CT imaging of the abdomen and pelvis was performed using the standard protocol following bolus administration of intravenous contrast. RADIATION DOSE REDUCTION: This exam was performed according to the departmental dose-optimization program which includes automated exposure control, adjustment of the mA and/or kV according to patient size and/or use of iterative reconstruction technique. CONTRAST:  OMNIPAQUE IOHEXOL 350 MG/ML SOLN COMPARISON:  CTA chest, abdomen, and pelvis dated 05/22/2023. FINDINGS: Lower chest: Please refer to the same-day CT chest for description of intrathoracic findings. Hepatobiliary: No focal liver abnormality is seen. No gallstones, gallbladder wall thickening, or biliary dilatation. Pancreas: Unremarkable. No pancreatic ductal dilatation or surrounding inflammatory changes. Spleen: Splenomegaly measuring up to 14.6 cm in craniocaudal dimension. No focal lesion. Adrenals/Urinary Tract: Adrenal glands are unremarkable. Kidneys are normal, without renal calculi, focal lesion, or hydronephrosis. Bladder is unremarkable. Stomach/Bowel: Stomach is within normal limits. Appendix appears normal. No evidence of bowel wall  thickening, distention, or inflammatory changes. Sigmoid colonic diverticulosis without evidence of acute diverticulitis. Vascular/Lymphatic: Abdominal aorta is normal in caliber with mild aortic atherosclerosis. No enlarged abdominopelvic lymph nodes. Reproductive: Uterus and bilateral adnexa are unremarkable. Other: No abdominopelvic ascites.  No intraperitoneal free air. Partially visualized area of loculated fluid density along the right lateral chest wall (series 4, image 99), with surrounding surgical clips, likely reflects a postsurgical fluid collection related to interval bilateral mastectomies. This region was not included within the field of view on the same-day CT chest. Musculoskeletal: No acute osseous abnormality. No suspicious osseous lesion. IMPRESSION: 1. No acute localizing findings in the abdomen or pelvis. 2. Partially visualized fluid collection along the right lateral chest wall, with surrounding surgical clips, likely reflects a postsurgical fluid collection of indeterminate sterility related to interval bilateral mastectomies. This region was not included within the field of view on the same-day CT chest. 3. Sigmoid colonic diverticulosis without evidence of acute diverticulitis. 4. Splenomegaly. Electronically Signed   By: Hart Robinsons M.D.   On: 08/23/2023 14:47   CT Angio  Chest PE W and/or Wo Contrast Result Date: 08/23/2023 CLINICAL DATA:  Pulmonary embolism (PE) suspected, high prob EXAM: CT ANGIOGRAPHY CHEST WITH CONTRAST TECHNIQUE: Multidetector CT imaging of the chest was performed using the standard protocol during bolus administration of intravenous contrast. Multiplanar CT image reconstructions and MIPs were obtained to evaluate the vascular anatomy. RADIATION DOSE REDUCTION: This exam was performed according to the departmental dose-optimization program which includes automated exposure control, adjustment of the mA and/or kV according to patient size and/or use of  iterative reconstruction technique. CONTRAST:  OMNIPAQUE IOHEXOL 350 MG/ML SOLN COMPARISON:  CTA chest dated 05/22/2023. FINDINGS: Cardiovascular: Satisfactory opacification of the pulmonary arteries to the segmental level. No evidence of pulmonary embolism. Normal heart size. No pericardial effusion. Thoracic aorta is normal in caliber. Catheter tip again noted at the superior cavoatrial junction. Mediastinum/Nodes: No enlarged mediastinal, hilar, or axillary lymph nodes. Similar 12 mm right-sided thyroid nodule. Trachea and esophagus demonstrate no significant findings. Lungs/Pleura: Bilateral mosaic attenuation. No dense focal consolidation. Minimal bibasilar atelectasis. No pleural effusion or pneumothorax. No suspicious pulmonary nodule identified. Upper Abdomen: No acute abnormality. Musculoskeletal: Status post interval bilateral mastectomies. Large left chest wall wound extending along the mastectomy site. Regions of curvilinear air extending along the chest left chest wall from the level of the open wound likely contiguous with overlying skin flaps. This large wound/soft tissue defect measures approximately 12.8 cm transverse by 10.8 cm craniocaudal. No definite soft tissue gas is identified. There is surrounding associated stranding with overlying cutaneous thickening. Ill-defined fluid extending along subcutaneous tissues of the right chest wall mastectomy site, overlying the right pectoralis major muscle. No sizable loculated collection identified. There is surrounding stranding and overlying cutaneous thickening. Scattered surgical clips. No acute osseous abnormality.  No suspicious osseous lesion. Review of the MIP images confirms the above findings. IMPRESSION: 1. No evidence of pulmonary embolism. 2. Status post interval bilateral mastectomies. Large left chest wall wound and soft tissue defect extending along the mastectomy site. No discrete fluid collection identified. Ill-defined fluid  extending along subcutaneous tissues of the right chest wall mastectomy site, overlying the right pectoralis major muscle. No sizable loculated collection identified. Cellulitis can not be excluded. 3. Bilateral mosaic attenuation could reflect air trapping secondary to a small airways infectious/inflammatory etiology. 4. Similar right sided thyroid nodule. Consider further evaluation with nonemergent ultrasound. Electronically Signed   By: Hart Robinsons M.D.   On: 08/23/2023 14:37   DG Chest 2 View Result Date: 08/23/2023 CLINICAL DATA:  Sepsis.  Metastatic breast carcinoma. EXAM: CHEST - 2 VIEW COMPARISON:  07/09/2023 FINDINGS: The heart size and mediastinal contours are within normal limits. Right-sided Port-A-Cath remains in appropriate position. Both lungs are clear. The visualized skeletal structures are unremarkable. IMPRESSION: No active cardiopulmonary disease. Electronically Signed   By: Danae Orleans M.D.   On: 08/23/2023 11:25    Procedures Procedures  {Document cardiac monitor, telemetry assessment procedure when appropriate:1}  Medications Ordered in ED Medications  metroNIDAZOLE (FLAGYL) IVPB 500 mg (0 mg Intravenous Stopped 08/23/23 1509)  fentaNYL (SUBLIMAZE) injection 25 mcg (has no administration in time range)  lactated ringers bolus 1,000 mL (0 mLs Intravenous Stopped 08/23/23 1418)  ondansetron (ZOFRAN) injection 4 mg (4 mg Intravenous Given 08/23/23 1010)  ketorolac (TORADOL) 30 MG/ML injection 30 mg (30 mg Intravenous Given 08/23/23 1011)  ceFEPIme (MAXIPIME) 2 g in sodium chloride 0.9 % 100 mL IVPB (0 g Intravenous Stopped 08/23/23 1054)  vancomycin (VANCOREADY) IVPB 2000 mg/400 mL (0 mg Intravenous Stopped  08/23/23 1418)  iohexol (OMNIPAQUE) 350 MG/ML injection 100 mL (100 mLs Intravenous Contrast Given 08/23/23 1311)  lactated ringers bolus 1,000 mL (1,000 mLs Intravenous New Bag/Given 08/23/23 1420)  lactated ringers bolus 1,000 mL (1,000 mLs Intravenous New Bag/Given 08/23/23 1419)   diphenhydrAMINE (BENADRYL) injection 25 mg (25 mg Intravenous Given 08/23/23 1408)  acetaminophen (TYLENOL) tablet 1,000 mg (1,000 mg Oral Given 08/23/23 1407)    ED Course/ Medical Decision Making/ A&P   {   Click here for ABCD2, HEART and other calculatorsREFRESH Note before signing :1}                              Medical Decision Making Amount and/or Complexity of Data Reviewed Labs: ordered. Radiology: ordered.  Risk OTC drugs. Prescription drug management.   ***  {Document critical care time when appropriate:1} {Document review of labs and clinical decision tools ie heart score, Chads2Vasc2 etc:1}  {Document your independent review of radiology images, and any outside records:1} {Document your discussion with family members, caretakers, and with consultants:1} {Document social determinants of health affecting pt's care:1} {Document your decision making why or why not admission, treatments were needed:1} Final Clinical Impression(s) / ED Diagnoses Final diagnoses:  None    Rx / DC Orders ED Discharge Orders     None

## 2023-08-23 NOTE — Progress Notes (Signed)
 Subjective/Chief Complaint: Patient with fevers last 48 hours and came to emergency room.  Seen in the ED.  Complains of pain over the right mastectomy flaps.  No pain on the left.   Objective: Vital signs in last 24 hours: Temp:  [102 F (38.9 C)-103.2 F (39.6 C)] 102 F (38.9 C) (04/03 1107) Pulse Rate:  [100-115] 100 (04/03 1109) Resp:  [20] 20 (04/03 1109) BP: (100-110)/(56-66) 100/56 (04/03 1108) SpO2:  [95 %-96 %] 95 % (04/03 1109)    Intake/Output from previous day: No intake/output data recorded. Intake/Output this shift: Total I/O In: 100 [IV Piggyback:100] Out: -   Left mastectomy wound with a central open area.  This measures about 8 x 8 cm.  This  is clean with minimal fibrinous exudate.  No erythema.  Right mastectomy wound is closed.  There is trace erythema.  Previous seroma is decompressed through the inferior aspect of this.  Tenderness noted.  No crepitance.  No evidence of necrotizing infection. Port site clean dry and intact right subclavian region. Lab Results:  Recent Labs    08/23/23 0940  WBC 18.1*  HGB 9.7*  HCT 31.3*  PLT 286   BMET Recent Labs    08/23/23 0940  NA 135  K 3.5  CL 101  CO2 21*  GLUCOSE 115*  BUN 12  CREATININE 1.07*  CALCIUM 8.5*   PT/INR Recent Labs    08/23/23 0940  LABPROT 16.1*  INR 1.3*   ABG No results for input(s): "PHART", "HCO3" in the last 72 hours.  Invalid input(s): "PCO2", "PO2"  Studies/Results: DG Chest 2 View Result Date: 08/23/2023 CLINICAL DATA:  Sepsis.  Metastatic breast carcinoma. EXAM: CHEST - 2 VIEW COMPARISON:  07/09/2023 FINDINGS: The heart size and mediastinal contours are within normal limits. Right-sided Port-A-Cath remains in appropriate position. Both lungs are clear. The visualized skeletal structures are unremarkable. IMPRESSION: No active cardiopulmonary disease. Electronically Signed   By: Danae Orleans M.D.   On: 08/23/2023 11:25    Anti-infectives: Anti-infectives (From  admission, onward)    Start     Dose/Rate Route Frequency Ordered Stop   08/23/23 1000  ceFEPIme (MAXIPIME) 2 g in sodium chloride 0.9 % 100 mL IVPB        2 g 200 mL/hr over 30 Minutes Intravenous  Once 08/23/23 0951 08/23/23 1054   08/23/23 1000  vancomycin (VANCOREADY) IVPB 2000 mg/400 mL        2,000 mg 200 mL/hr over 120 Minutes Intravenous  Once 08/23/23 0951     08/23/23 0945  metroNIDAZOLE (FLAGYL) IVPB 500 mg        500 mg 100 mL/hr over 60 Minutes Intravenous Every 12 hours 08/23/23 0944         Assessment/Plan: 1 month out from bilateral skin sparing mastectomies for stage IV breast cancer with history of large right seroma now decompressed through the inferior portion of the incision, skin necrosis on the left with open wound.  The right-sided seroma is decompressed.  There is no significant erythema but  mild trace erythema.  I suspect this was her source of infection and is decompressed out the inferior portion of the incision.  It is now flat.  There is no crepitance or signs of infection and there is a small open wound at the bottom of this.  The left chest wound is open and clean.  She wishes to try a wound VAC on the left.  Recommend medicine and oncology consults given her stage  IV disease and potential immunocompromise state.  Agree with antibiotics.  Cultures were sent.  I suspect she will have subcutaneous air on both sides given the open wounds therefore imaging will need to be interpreted without in mind.  I see no clinical signs of a necrotizing infection at this point in time.  Port site is intact and clean right subclavian region.  No redness or fluctuance.   LOS: 0 days    Dortha Schwalbe MD  08/23/2023

## 2023-08-24 DIAGNOSIS — A419 Sepsis, unspecified organism: Secondary | ICD-10-CM | POA: Diagnosis not present

## 2023-08-24 DIAGNOSIS — N61 Mastitis without abscess: Secondary | ICD-10-CM | POA: Diagnosis not present

## 2023-08-24 DIAGNOSIS — L0390 Cellulitis, unspecified: Secondary | ICD-10-CM | POA: Diagnosis not present

## 2023-08-24 LAB — BLOOD CULTURE ID PANEL (REFLEXED) - BCID2

## 2023-08-24 LAB — CBC
HCT: 31.1 % — ABNORMAL LOW (ref 36.0–46.0)
Hemoglobin: 9.4 g/dL — ABNORMAL LOW (ref 12.0–15.0)
MCH: 30.1 pg (ref 26.0–34.0)
MCHC: 30.2 g/dL (ref 30.0–36.0)
MCV: 99.7 fL (ref 80.0–100.0)
Platelets: 229 10*3/uL (ref 150–400)
RBC: 3.12 MIL/uL — ABNORMAL LOW (ref 3.87–5.11)
RDW: 16.5 % — ABNORMAL HIGH (ref 11.5–15.5)
WBC: 14.4 10*3/uL — ABNORMAL HIGH (ref 4.0–10.5)
nRBC: 0 % (ref 0.0–0.2)

## 2023-08-24 LAB — BASIC METABOLIC PANEL WITH GFR
Anion gap: 11 (ref 5–15)
BUN: 18 mg/dL (ref 6–20)
CO2: 19 mmol/L — ABNORMAL LOW (ref 22–32)
Calcium: 8.2 mg/dL — ABNORMAL LOW (ref 8.9–10.3)
Chloride: 104 mmol/L (ref 98–111)
Creatinine, Ser: 0.98 mg/dL (ref 0.44–1.00)
GFR, Estimated: >60 mL/min (ref >60)
Glucose, Bld: 92 mg/dL (ref 70–99)
Potassium: 3.5 mmol/L (ref 3.5–5.1)
Sodium: 134 mmol/L — ABNORMAL LOW (ref 135–145)

## 2023-08-24 NOTE — Consult Note (Signed)
 WOC Nurse Consult Note: Reason for Consult: place NPWT to the left breast wound; right breast wound present as well Wound type: surgical post bilateral mastectomy  Pressure Injury POA: NA Measurement: Left breast 5cm x 9cm x 2cm with tunnel at 3 o'clock that is 5cm and tunnel at 9 o'clock that is 8cm  Wound ZOX:WRUEAV pink, skin necrosis noted at 9 o'clock and under the breast two small areas, I did touch this with qtip seems to be approximated  Right breast: dried drainage along the proximal incision, but is closed.  One small opening less thatn 0.5cm that tracks proximally 2-3 cm, can not visualize wound base  Drainage (amount, consistency, odor) thicker yellow drainage from the left breast wound; serous from the right breast. I am not able to express drainage with palpation from the right breast.  Periwound:he is acutely tender to the touch with erythema and induration at the right lateral aspect of upper chest into the axilla  Dressing procedure/placement/frequency: NPWT (1) pc of black foam placed to the left breast wound tucked into tunnels, sealed at . Patient is insensate in this area  Strip of silver hydrofiber threaded into the right breast wound with wick left 0.5cm for drainage. Topped with silicone foam  Will plan to see patient Monday or Tuesday, explained to the patient 2x wk frequency can either be T/FRI or MON/THUR Supplies in the room for next dressing change.  Chamara Dyck [facility], CNS, [employer] [phone]

## 2023-08-24 NOTE — Progress Notes (Signed)
 Subjective/Chief Complaint: Febrile overnight  Pain over left chest    Objective: Vital signs in last 24 hours: Temp:  [98.1 F (36.7 C)-103.2 F (39.6 C)] 98.1 F (36.7 C) (04/04 0530) Pulse Rate:  [86-115] 88 (04/04 0530) Resp:  [17-20] 18 (04/04 0530) BP: (92-126)/(56-78) 110/65 (04/04 0530) SpO2:  [93 %-99 %] 93 % (04/04 0530) Weight:  [126.4 kg] 126.4 kg (04/04 0533) Last BM Date : 08/23/23 (per patient)  Intake/Output from previous day: 04/03 0701 - 04/04 0700 In: 1688.6 [P.O.:240; IV Piggyback:1448.6] Out: -  Intake/Output this shift: No intake/output data recorded.  Left mastectomy wound open with minimal exudate at edges, granulation tissue noted  Right mastectomy with drainage at inferior border, no fluctuance but tender with mild cellulitis, no undrained fluid collection, port site intact    Lab Results:  Recent Labs    08/23/23 0940 08/24/23 0504  WBC 18.1* 14.4*  HGB 9.7* 9.4*  HCT 31.3* 31.1*  PLT 286 229   BMET Recent Labs    08/23/23 0940 08/24/23 0504  NA 135 134*  K 3.5 3.5  CL 101 104  CO2 21* 19*  GLUCOSE 115* 92  BUN 12 18  CREATININE 1.07* 0.98  CALCIUM 8.5* 8.2*   PT/INR Recent Labs    08/23/23 0940  LABPROT 16.1*  INR 1.3*   ABG No results for input(s): "PHART", "HCO3" in the last 72 hours.  Invalid input(s): "PCO2", "PO2"  Studies/Results: CT ABDOMEN PELVIS W CONTRAST Result Date: 08/23/2023 CLINICAL DATA:  Left upper quadrant pain. EXAM: CT ABDOMEN AND PELVIS WITH CONTRAST TECHNIQUE: Multidetector CT imaging of the abdomen and pelvis was performed using the standard protocol following bolus administration of intravenous contrast. RADIATION DOSE REDUCTION: This exam was performed according to the departmental dose-optimization program which includes automated exposure control, adjustment of the mA and/or kV according to patient size and/or use of iterative reconstruction technique. CONTRAST:  OMNIPAQUE IOHEXOL 350  MG/ML SOLN COMPARISON:  CTA chest, abdomen, and pelvis dated 05/22/2023. FINDINGS: Lower chest: Please refer to the same-day CT chest for description of intrathoracic findings. Hepatobiliary: No focal liver abnormality is seen. No gallstones, gallbladder wall thickening, or biliary dilatation. Pancreas: Unremarkable. No pancreatic ductal dilatation or surrounding inflammatory changes. Spleen: Splenomegaly measuring up to 14.6 cm in craniocaudal dimension. No focal lesion. Adrenals/Urinary Tract: Adrenal glands are unremarkable. Kidneys are normal, without renal calculi, focal lesion, or hydronephrosis. Bladder is unremarkable. Stomach/Bowel: Stomach is within normal limits. Appendix appears normal. No evidence of bowel wall thickening, distention, or inflammatory changes. Sigmoid colonic diverticulosis without evidence of acute diverticulitis. Vascular/Lymphatic: Abdominal aorta is normal in caliber with mild aortic atherosclerosis. No enlarged abdominopelvic lymph nodes. Reproductive: Uterus and bilateral adnexa are unremarkable. Other: No abdominopelvic ascites.  No intraperitoneal free air. Partially visualized area of loculated fluid density along the right lateral chest wall (series 4, image 99), with surrounding surgical clips, likely reflects a postsurgical fluid collection related to interval bilateral mastectomies. This region was not included within the field of view on the same-day CT chest. Musculoskeletal: No acute osseous abnormality. No suspicious osseous lesion. IMPRESSION: 1. No acute localizing findings in the abdomen or pelvis. 2. Partially visualized fluid collection along the right lateral chest wall, with surrounding surgical clips, likely reflects a postsurgical fluid collection of indeterminate sterility related to interval bilateral mastectomies. This region was not included within the field of view on the same-day CT chest. 3. Sigmoid colonic diverticulosis without evidence of acute  diverticulitis. 4. Splenomegaly. Electronically Signed  By: Hart Robinsons M.D.   On: 08/23/2023 14:47   CT Angio Chest PE W and/or Wo Contrast Result Date: 08/23/2023 CLINICAL DATA:  Pulmonary embolism (PE) suspected, high prob EXAM: CT ANGIOGRAPHY CHEST WITH CONTRAST TECHNIQUE: Multidetector CT imaging of the chest was performed using the standard protocol during bolus administration of intravenous contrast. Multiplanar CT image reconstructions and MIPs were obtained to evaluate the vascular anatomy. RADIATION DOSE REDUCTION: This exam was performed according to the departmental dose-optimization program which includes automated exposure control, adjustment of the mA and/or kV according to patient size and/or use of iterative reconstruction technique. CONTRAST:  OMNIPAQUE IOHEXOL 350 MG/ML SOLN COMPARISON:  CTA chest dated 05/22/2023. FINDINGS: Cardiovascular: Satisfactory opacification of the pulmonary arteries to the segmental level. No evidence of pulmonary embolism. Normal heart size. No pericardial effusion. Thoracic aorta is normal in caliber. Catheter tip again noted at the superior cavoatrial junction. Mediastinum/Nodes: No enlarged mediastinal, hilar, or axillary lymph nodes. Similar 12 mm right-sided thyroid nodule. Trachea and esophagus demonstrate no significant findings. Lungs/Pleura: Bilateral mosaic attenuation. No dense focal consolidation. Minimal bibasilar atelectasis. No pleural effusion or pneumothorax. No suspicious pulmonary nodule identified. Upper Abdomen: No acute abnormality. Musculoskeletal: Status post interval bilateral mastectomies. Large left chest wall wound extending along the mastectomy site. Regions of curvilinear air extending along the chest left chest wall from the level of the open wound likely contiguous with overlying skin flaps. This large wound/soft tissue defect measures approximately 12.8 cm transverse by 10.8 cm craniocaudal. No definite soft tissue gas is  identified. There is surrounding associated stranding with overlying cutaneous thickening. Ill-defined fluid extending along subcutaneous tissues of the right chest wall mastectomy site, overlying the right pectoralis major muscle. No sizable loculated collection identified. There is surrounding stranding and overlying cutaneous thickening. Scattered surgical clips. No acute osseous abnormality.  No suspicious osseous lesion. Review of the MIP images confirms the above findings. IMPRESSION: 1. No evidence of pulmonary embolism. 2. Status post interval bilateral mastectomies. Large left chest wall wound and soft tissue defect extending along the mastectomy site. No discrete fluid collection identified. Ill-defined fluid extending along subcutaneous tissues of the right chest wall mastectomy site, overlying the right pectoralis major muscle. No sizable loculated collection identified. Cellulitis can not be excluded. 3. Bilateral mosaic attenuation could reflect air trapping secondary to a small airways infectious/inflammatory etiology. 4. Similar right sided thyroid nodule. Consider further evaluation with nonemergent ultrasound. Electronically Signed   By: Hart Robinsons M.D.   On: 08/23/2023 14:37   DG Chest 2 View Result Date: 08/23/2023 CLINICAL DATA:  Sepsis.  Metastatic breast carcinoma. EXAM: CHEST - 2 VIEW COMPARISON:  07/09/2023 FINDINGS: The heart size and mediastinal contours are within normal limits. Right-sided Port-A-Cath remains in appropriate position. Both lungs are clear. The visualized skeletal structures are unremarkable. IMPRESSION: No active cardiopulmonary disease. Electronically Signed   By: Danae Orleans M.D.   On: 08/23/2023 11:25    Anti-infectives: Anti-infectives (From admission, onward)    Start     Dose/Rate Route Frequency Ordered Stop   08/24/23 1700  vancomycin (VANCOREADY) IVPB 1250 mg/250 mL        1,250 mg 166.7 mL/hr over 90 Minutes Intravenous Every 24 hours 08/23/23  1651     08/24/23 1100  vancomycin (VANCOREADY) IVPB 1250 mg/250 mL  Status:  Discontinued        1,250 mg 166.7 mL/hr over 90 Minutes Intravenous Every 24 hours 08/23/23 1608 08/23/23 1651   08/23/23 1800  ceFEPIme (MAXIPIME)  2 g in sodium chloride 0.9 % 100 mL IVPB        2 g 200 mL/hr over 30 Minutes Intravenous Every 8 hours 08/23/23 1608     08/23/23 1000  ceFEPIme (MAXIPIME) 2 g in sodium chloride 0.9 % 100 mL IVPB        2 g 200 mL/hr over 30 Minutes Intravenous  Once 08/23/23 0951 08/23/23 1054   08/23/23 1000  vancomycin (VANCOREADY) IVPB 2000 mg/400 mL        2,000 mg 200 mL/hr over 120 Minutes Intravenous  Once 08/23/23 0951 08/23/23 1418   08/23/23 0945  metroNIDAZOLE (FLAGYL) IVPB 500 mg        500 mg 100 mL/hr over 60 Minutes Intravenous Every 12 hours 08/23/23 0944         Assessment/Plan: s/p  B mastectomy 1 month ago with cellulitis  Right side has drained but will drain for a number of weeks, this should improve over time- cellulitis is stable if not improved - staph noted from left wound but this has been open for some time so more than likely contaminated  Suspect right sided seroma got infected and drained spontaneously Port may be at risk given blood cultures so will need to follow fever curve and course  Wound vac for left side  Can go home once ID issues resolved  [facility]  No further surgery at this point but will follow     LOS: 1 day    Maisie Fus A Robi Dewolfe 08/24/2023

## 2023-08-24 NOTE — Progress Notes (Signed)
 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  Patient was admitted for the management of sepsis secondary to cellulitis of bilateral mastectomy wounds.  General surgery, oncology following.  Wound culture, blood cultures and Gram positive cocci, likely Staph aureus.  ID consulted  Assessment & Plan:  Principal Problem:   Sepsis due to cellulitis (HCC)  Sepsis secondary to cellulitis of the bilateral  breast: Presented with fever, leukocytosis.  Elevated lactate at 2 on presentation, blood pressure was soft.  Afebrile this morning.  Continue current antibiotics, follow cultures.  Continue gentle IV fluids. Left-sided breast wound culture showing Staph aureus, one of the blood cultures showing gram-positive cocci in clusters.  ID consulted.  Continue current antibiotics  Status post bilateral mastectomy: History of inflammatory breast cancer.status post bilateral simple mastectomy with lymph node dissection on 3/6.  Has wounds on bilateral breasts.  General surgery, oncology following.  Wound VAC on the left side  Hypertension: On Coreg at home.  Currently on hold  Depression: On Lexapro  GERD: Protonix  Morbid obesity: BMI 52.6           DVT prophylaxis:Lovenox     Code Status: Full Code  Family Communication: None at bedside  Patient status:Inpatient  Patient is from :Home  Anticipated discharge LK:GMWN  Estimated DC date: After resolution of sepsis, cellulitis   Consultants: ID,general surgery  Procedures:None  Antimicrobials:  Anti-infectives (From admission, onward)    Start      Dose/Rate Route Frequency Ordered Stop   08/24/23 1700  vancomycin (VANCOREADY) IVPB 1250 mg/250 mL        1,250 mg 166.7 mL/hr over 90 Minutes Intravenous Every 24 hours 08/23/23 1651     08/24/23 1100  vancomycin (VANCOREADY) IVPB 1250 mg/250 mL  Status:  Discontinued        1,250 mg 166.7 mL/hr over 90 Minutes Intravenous Every 24 hours 08/23/23 1608 08/23/23 1651   08/23/23 1800  ceFEPIme (MAXIPIME) 2 g in sodium chloride 0.9 % 100 mL IVPB        2 g 200 mL/hr over 30 Minutes Intravenous Every 8 hours 08/23/23 1608     08/23/23 1000  ceFEPIme (MAXIPIME) 2 g in sodium chloride 0.9 % 100 mL IVPB        2 g 200 mL/hr over 30 Minutes Intravenous  Once 08/23/23 0951 08/23/23 1054   08/23/23 1000  vancomycin (VANCOREADY) IVPB 2000 mg/400 mL        2,000 mg 200 mL/hr over 120 Minutes Intravenous  Once 08/23/23 0951 08/23/23 1418   08/23/23 0945  metroNIDAZOLE (FLAGYL) IVPB 500 mg        500 mg 100 mL/hr over 60 Minutes Intravenous Every 12 hours 08/23/23 0944         Subjective: Patient seen and examined at bedside today.  Hemodynamically stable.  Lying in bed.  Febrile this morning but afebrile during my evaluation.  Wound care nurse was at bedside.  Denies any severe pain in discomfort during my evaluation today  Objective: Vitals:   08/24/23 0235 08/24/23 0337 08/24/23 0530 08/24/23 0533  BP:   110/65   Pulse:   88   Resp:  18   Temp: (!) 101.8 F (38.8 C) (!) 101.3 F (38.5 C) 98.1 F (36.7 C)   TempSrc: Oral Oral Oral   SpO2:   93%   Weight:    126.4 kg  Height:    5\' 1"  (1.549 m)    Intake/Output Summary (Last 24 hours) at 08/24/2023 0735 Last data filed at 08/24/2023 0600 Gross per 24 hour  Intake 1688.59 ml  Output --  Net 1688.59 ml   Filed Weights   08/24/23 0533  Weight: 126.4 kg    Examination:  General exam: Overall comfortable, not in distress,morbidly obese HEENT: PERRL Respiratory system:  no wheezes or crackles  Cardiovascular system: S1 & S2  heard, RRR.  Chest: Right breast covered with dressing, open left breast wound Gastrointestinal system: Abdomen is nondistended, soft and nontender. Central nervous system: Alert and oriented Extremities: No edema, no clubbing ,no cyanosis Skin: No rashes, no ulcers,no icterus     Data Reviewed: I have personally reviewed following labs and imaging studies  CBC: Recent Labs  Lab 08/23/23 0940 08/24/23 0504  WBC 18.1* 14.4*  NEUTROABS 16.1*  --   HGB 9.7* 9.4*  HCT 31.3* 31.1*  MCV 98.1 99.7  PLT 286 229   Basic Metabolic Panel: Recent Labs  Lab 08/23/23 0940 08/24/23 0504  NA 135 134*  K 3.5 3.5  CL 101 104  CO2 21* 19*  GLUCOSE 115* 92  BUN 12 18  CREATININE 1.07* 0.98  CALCIUM 8.5* 8.2*     Recent Results (from the past 240 hours)  Culture, blood (Routine x 2)     Status: None (Preliminary result)   Collection Time: 08/23/23  9:55 AM   Specimen: BLOOD LEFT HAND  Result Value Ref Range Status   Specimen Description   Final    BLOOD LEFT HAND Performed at [facility], [address] [place], [place] [address]    Special Requests   Final    BOTTLES DRAWN AEROBIC AND ANAEROBIC Blood Culture adequate volume Performed at [facility], [address] [place], [place] [address]    Culture PENDING  Incomplete   Report Status PENDING  Incomplete  Aerobic Culture w Gram Stain (superficial specimen)     Status: None (Preliminary result)   Collection Time: 08/23/23 10:55 AM   Specimen: Wound  Result Value Ref Range Status   Specimen Description   Final    WOUND LEFT BREAST Performed at [facility], [address] [place], [place] [address]    Special Requests   Final    NONE Performed at [facility], [address] [place], [place] [address]    Gram Stain   Final    MODERATE WBC PRESENT, PREDOMINANTLY PMN FEW GRAM POSITIVE COCCI IN PAIRS    Culture   Final    FEW STAPHYLOCOCCUS  AUREUS SUSCEPTIBILITIES TO FOLLOW Performed at [facility], [address], [place], [place] [address]    Report Status PENDING  Incomplete  Resp panel by RT-PCR (RSV, Flu A&B, Covid) Anterior Nasal Swab     Status: None   Collection Time: 08/23/23 10:55 AM   Specimen: Anterior Nasal Swab  Result Value Ref Range Status   SARS Coronavirus 2 by RT PCR NEGATIVE NEGATIVE Final    Comment: (NOTE) SARS-CoV-2 target nucleic acids are NOT DETECTED.  The SARS-CoV-2 RNA is generally detectable in upper respiratory specimens during the acute phase of infection. The lowest concentration of SARS-CoV-2  viral copies this assay can detect is 138 copies/mL. A negative result does not preclude SARS-Cov-2 infection and should not be used as the sole basis for treatment or other patient management decisions. A negative result may occur with  improper specimen collection/handling, submission of specimen other than nasopharyngeal swab, presence of viral mutation(s) within the areas targeted by this assay, and inadequate number of viral copies(<138 copies/mL). A negative result must be combined with clinical observations, patient history, and epidemiological information. The expected result is Negative.  Fact Sheet for Patients:  BloggerCourse.com  Fact Sheet for Healthcare Providers:  SeriousBroker.it  This test is no t yet approved or cleared by the [place] FDA and  has been authorized for detection and/or diagnosis of SARS-CoV-2 by FDA under an Emergency Use Authorization (EUA). This EUA will remain  in effect (meaning this test can be used) for the duration of the COVID-19 declaration under Section 564(b)(1) of the Act, 21 U.S.C.section 360bbb-3(b)(1), unless the authorization is terminated  or revoked sooner.       Influenza A by PCR NEGATIVE NEGATIVE Final   Influenza B by PCR NEGATIVE NEGATIVE Final    Comment: (NOTE) The  Xpert Xpress SARS-CoV-2/FLU/RSV plus assay is intended as an aid in the diagnosis of influenza from Nasopharyngeal swab specimens and should not be used as a sole basis for treatment. Nasal washings and aspirates are unacceptable for Xpert Xpress SARS-CoV-2/FLU/RSV testing.  Fact Sheet for Patients: BloggerCourse.com  Fact Sheet for Healthcare Providers: SeriousBroker.it  This test is not yet approved or cleared by the [place] FDA and has been authorized for detection and/or diagnosis of SARS-CoV-2 by FDA under an Emergency Use Authorization (EUA). This EUA will remain in effect (meaning this test can be used) for the duration of the COVID-19 declaration under Section 564(b)(1) of the Act, 21 U.S.C. section 360bbb-3(b)(1), unless the authorization is terminated or revoked.     Resp Syncytial Virus by PCR NEGATIVE NEGATIVE Final    Comment: (NOTE) Fact Sheet for Patients: BloggerCourse.com  Fact Sheet for Healthcare Providers: SeriousBroker.it  This test is not yet approved or cleared by the [place] FDA and has been authorized for detection and/or diagnosis of SARS-CoV-2 by FDA under an Emergency Use Authorization (EUA). This EUA will remain in effect (meaning this test can be used) for the duration of the COVID-19 declaration under Section 564(b)(1) of the Act, 21 U.S.C. section 360bbb-3(b)(1), unless the authorization is terminated or revoked.  Performed at [facility], [address] [place], [place] [address]      Radiology Studies: CT ABDOMEN PELVIS W CONTRAST Result Date: 08/23/2023 CLINICAL DATA:  Left upper quadrant pain. EXAM: CT ABDOMEN AND PELVIS WITH CONTRAST TECHNIQUE: Multidetector CT imaging of the abdomen and pelvis was performed using the standard protocol following bolus administration of intravenous contrast. RADIATION DOSE  REDUCTION: This exam was performed according to the departmental dose-optimization program which includes automated exposure control, adjustment of the mA and/or kV according to patient size and/or use of iterative reconstruction technique. CONTRAST:  OMNIPAQUE IOHEXOL 350 MG/ML SOLN COMPARISON:  CTA chest, abdomen, and pelvis dated 05/22/2023. FINDINGS: Lower chest: Please refer to the same-day CT chest for description of intrathoracic findings. Hepatobiliary: No focal liver abnormality is seen. No gallstones, gallbladder wall thickening, or biliary dilatation. Pancreas: Unremarkable. No pancreatic ductal dilatation or surrounding inflammatory changes. Spleen: Splenomegaly measuring up to 14.6 cm in craniocaudal dimension. No focal lesion. Adrenals/Urinary Tract: Adrenal glands are unremarkable. Kidneys are normal,  without renal calculi, focal lesion, or hydronephrosis. Bladder is unremarkable. Stomach/Bowel: Stomach is within normal limits. Appendix appears normal. No evidence of bowel wall thickening, distention, or inflammatory changes. Sigmoid colonic diverticulosis without evidence of acute diverticulitis. Vascular/Lymphatic: Abdominal aorta is normal in caliber with mild aortic atherosclerosis. No enlarged abdominopelvic lymph nodes. Reproductive: Uterus and bilateral adnexa are unremarkable. Other: No abdominopelvic ascites.  No intraperitoneal free air. Partially visualized area of loculated fluid density along the right lateral chest wall (series 4, image 99), with surrounding surgical clips, likely reflects a postsurgical fluid collection related to interval bilateral mastectomies. This region was not included within the field of view on the same-day CT chest. Musculoskeletal: No acute osseous abnormality. No suspicious osseous lesion. IMPRESSION: 1. No acute localizing findings in the abdomen or pelvis. 2. Partially visualized fluid collection along the right lateral chest wall, with surrounding  surgical clips, likely reflects a postsurgical fluid collection of indeterminate sterility related to interval bilateral mastectomies. This region was not included within the field of view on the same-day CT chest. 3. Sigmoid colonic diverticulosis without evidence of acute diverticulitis. 4. Splenomegaly. Electronically Signed   By: Hart Robinsons M.D.   On: 08/23/2023 14:47   CT Angio Chest PE W and/or Wo Contrast Result Date: 08/23/2023 CLINICAL DATA:  Pulmonary embolism (PE) suspected, high prob EXAM: CT ANGIOGRAPHY CHEST WITH CONTRAST TECHNIQUE: Multidetector CT imaging of the chest was performed using the standard protocol during bolus administration of intravenous contrast. Multiplanar CT image reconstructions and MIPs were obtained to evaluate the vascular anatomy. RADIATION DOSE REDUCTION: This exam was performed according to the departmental dose-optimization program which includes automated exposure control, adjustment of the mA and/or kV according to patient size and/or use of iterative reconstruction technique. CONTRAST:  OMNIPAQUE IOHEXOL 350 MG/ML SOLN COMPARISON:  CTA chest dated 05/22/2023. FINDINGS: Cardiovascular: Satisfactory opacification of the pulmonary arteries to the segmental level. No evidence of pulmonary embolism. Normal heart size. No pericardial effusion. Thoracic aorta is normal in caliber. Catheter tip again noted at the superior cavoatrial junction. Mediastinum/Nodes: No enlarged mediastinal, hilar, or axillary lymph nodes. Similar 12 mm right-sided thyroid nodule. Trachea and esophagus demonstrate no significant findings. Lungs/Pleura: Bilateral mosaic attenuation. No dense focal consolidation. Minimal bibasilar atelectasis. No pleural effusion or pneumothorax. No suspicious pulmonary nodule identified. Upper Abdomen: No acute abnormality. Musculoskeletal: Status post interval bilateral mastectomies. Large left chest wall wound extending along the mastectomy site. Regions  of curvilinear air extending along the chest left chest wall from the level of the open wound likely contiguous with overlying skin flaps. This large wound/soft tissue defect measures approximately 12.8 cm transverse by 10.8 cm craniocaudal. No definite soft tissue gas is identified. There is surrounding associated stranding with overlying cutaneous thickening. Ill-defined fluid extending along subcutaneous tissues of the right chest wall mastectomy site, overlying the right pectoralis major muscle. No sizable loculated collection identified. There is surrounding stranding and overlying cutaneous thickening. Scattered surgical clips. No acute osseous abnormality.  No suspicious osseous lesion. Review of the MIP images confirms the above findings. IMPRESSION: 1. No evidence of pulmonary embolism. 2. Status post interval bilateral mastectomies. Large left chest wall wound and soft tissue defect extending along the mastectomy site. No discrete fluid collection identified. Ill-defined fluid extending along subcutaneous tissues of the right chest wall mastectomy site, overlying the right pectoralis major muscle. No sizable loculated collection identified. Cellulitis can not be excluded. 3. Bilateral mosaic attenuation could reflect air trapping secondary to a small airways infectious/inflammatory etiology.  4. Similar right sided thyroid nodule. Consider further evaluation with nonemergent ultrasound. Electronically Signed   By: Hart Robinsons M.D.   On: 08/23/2023 14:37   DG Chest 2 View Result Date: 08/23/2023 CLINICAL DATA:  Sepsis.  Metastatic breast carcinoma. EXAM: CHEST - 2 VIEW COMPARISON:  07/09/2023 FINDINGS: The heart size and mediastinal contours are within normal limits. Right-sided Port-A-Cath remains in appropriate position. Both lungs are clear. The visualized skeletal structures are unremarkable. IMPRESSION: No active cardiopulmonary disease. Electronically Signed   By: Danae Orleans M.D.   On:  08/23/2023 11:25    Scheduled Meds:  carvedilol  3.125 mg Oral BID WC   cycloSPORINE  1 drop Both Eyes BID   docusate sodium  100 mg Oral BID   enoxaparin (LOVENOX) injection  60 mg Subcutaneous Q24H   escitalopram  20 mg Oral Daily   feeding supplement  237 mL Oral BID BM   pantoprazole  40 mg Oral Daily   Continuous Infusions:  ceFEPime (MAXIPIME) IV 2 g (08/24/23 0532)   metronidazole 500 mg (08/23/23 2215)   vancomycin       LOS: 1 day   Burnadette Pop, MD [facility] P4/08/2023, 7:35 AM

## 2023-08-24 NOTE — TOC Transition Note (Addendum)
 Transition of Care [facility]) - Discharge Note   Patient Details  Name: Diamond Marshall MRN: [id number] Date of Birth: 24-Apr-1971  [facility]) CM/SW Contact:  Lanier Clam, RN Phone Number: 08/24/2023, 9:36 AM   Clinical Narrative:  Received HHRN;wound vac orders-contacted patient no preference-[employer] rep French Ana following for wound vac auth, & delivery of wound vac;Will check on [facility] agency for HHRN-wound vac changes-await agency to accept.  -1:38p Unable to get [facility] agency however-patient has a friend who is a nurse that can do the wound care with some teaching Melody can u connect with patient to coordinate visit for teaching here when WOC available or tell me what info for WOC to schedule teaching  -4:41p emailed [employer] wound vac signature page back to Elenor Legato will have delivery of wound vac to patients rm. Signed [employer] form in shadow chart if needed.    Final next level of care: Home w Home Health Services Barriers to Discharge: No Barriers Identified   Patient Goals and CMS Choice Patient states their goals for this hospitalization and ongoing recovery are:: Home CMS Medicare.gov Compare Post Acute Care list provided to:: Patient Choice offered to / list presented to : Patient [place] ownership interest in [facility].provided to:: Patient    Discharge Placement                       Discharge Plan and Services Additional resources added to the After Visit Summary for     Discharge Planning Services: CM Consult Post Acute Care Choice: Home Health          DME Arranged: Negative pressure wound device DME Agency: [employer] Date DME Agency Contacted: 08/24/23 Time DME Agency Contacted: [phone] Representative spoke with at DME Agency: French Ana            Social Drivers of Health (SDOH) Interventions SDOH Screenings   Food Insecurity: No Food Insecurity (08/23/2023)  Housing: Low Risk  (08/23/2023)  Transportation Needs: No Transportation Needs  (08/23/2023)  Utilities: Not At Risk (08/23/2023)  Depression (PHQ2-9): Low Risk  (08/06/2023)  Social Connections: Moderately Integrated (07/26/2023)  Tobacco Use: Low Risk  (08/23/2023)     Readmission Risk Interventions     No data to display

## 2023-08-24 NOTE — Consult Note (Signed)
 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 Dr Davina Poke breast surgeon and wound care evaluated. Tender and mild erythema. No fluctuance  Left breast mastectomy small wound superficial swab staph aureus but minimal to no sx there  4/3 blood cx 1 of 2 set gpc (1 of 4 bottles) unclear significance yet   Patient does report local pain/tenderness around the port that hasn't bee assessed for at least a month. Will need to see if this is seeded with bacteremia  She does have high fever 103s and leukocytosis on admission   Plan: Treat as cellulitis/infected seroma for now with vancomycin F/u bcx and will determine subsequent management of the port Repeat blood cx Maintain standard isolation precaution for now Discussed with primary team      ------------------------------------------------ Principal Problem:   Sepsis due to cellulitis [facility])    HPI: Diamond Marshall is a 53 y.o. 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 finished 06/2023 with bilateral mastectomy/node dissection 07/26/23, admitted 4/3 for 2 days of febrile illness, found to have gpc on bcx, and a week right breast pain and serosanguinous discharge  She had acute 2 days subjective f/c 1 week clear/yellowish drainage right  breast and some pain there Also some pain right chest port Malaise/decreased appetite  Left breast no pain  07/26/2023 breast mastectomy bilaterally Last round of neoadjuvant chemo mid 06/2023  Febrile on presentation Wbc 14 Bcx 1 of 4 bottles with gpc Ct scan showed fluid collection right breast  Breast surgeon/wound care involved  On bsAbx  No other complaint  Port has been in for a year  Family History  Problem Relation Age of Onset   Hypertension Mother    Diabetes Mother    High Cholesterol Mother    Thyroid disease Mother    Depression Mother    Anxiety disorder Mother    Obesity Mother    CVA Father        hemorrhagic   Ovarian cancer Maternal Aunt 70   Colon cancer Maternal Aunt    Breast cancer Cousin 54       maternal first cousin, reports negative genetic testing    Social History   Tobacco Use   Smoking status: Never   Smokeless tobacco: Never  Vaping Use   Vaping status: Never Used  Substance Use Topics   Alcohol use: No   Drug use: No    Allergies  Allergen Reactions   Phentermine Other (See Comments)    Became suicidal on the medication   Bee Venom Swelling and Other (See Comments)    Severe swelling where stung   Lactose Intolerance (Gi) Diarrhea   Peanut Allergen Powder-Dnfp Other (See Comments)    Migraines   Topiramate Other (See Comments)    Dizziness  Sulfa Antibiotics Itching and Rash   Tape Rash   Wound Dressing Adhesive Rash    Review of Systems: ROS All Other ROS was negative, except mentioned above   Past Medical History:  Diagnosis Date   Abdominal hernia    Anemia    b12 deficiency   Anxiety    Arthritis    B12 deficiency    Back pain    Cancer (HCC)    breast cancer   Depression    Family history of adverse reaction to anesthesia    mother vomits after anesthesia   Gallbladder problem    GERD (gastroesophageal reflux disease)    History of hiatal hernia    "small" per patient   Hypertension     Hypothyroidism    Joint pain    Knee pain    Palpitation    "when iron is low"   Pernicious anemia    SOB (shortness of breath)    Thyroid disease    hypothyroidism   Vitamin D deficiency        Scheduled Meds:  cycloSPORINE  1 drop Both Eyes BID   docusate sodium  100 mg Oral BID   enoxaparin (LOVENOX) injection  60 mg Subcutaneous Q24H   escitalopram  20 mg Oral Daily   feeding supplement  237 mL Oral BID BM   pantoprazole  40 mg Oral Daily   Continuous Infusions:  vancomycin     PRN Meds:.acetaminophen **OR** acetaminophen, albuterol, diphenhydrAMINE, HYDROmorphone (DILAUDID) injection, LORazepam, ondansetron **OR** ondansetron (ZOFRAN) IV, oxyCODONE, polyethylene glycol, traZODone   OBJECTIVE: Blood pressure 110/65, pulse 88, temperature 98.1 F (36.7 C), temperature source Oral, resp. rate 18, height 5\' 1"  (1.549 m), weight 126.4 kg, last menstrual period 10/13/2019, SpO2 93%, unknown if currently breastfeeding.  Physical Exam General/constitutional: no distress, pleasant, mild lethargy, cooperative/appropriate HEENT: Normocephalic, PER, Conj Clear, EOMI, Oropharynx clear. Thin hair Neck supple CV: rrr no mrg Lungs: clear to auscultation, normal respiratory effort Abd: Soft, Nontender Ext: no edema Skin: right chest mastectomy dressing c/d with slight tenderness;; left chest mastectomy wound vac in place no cellulitis change Neuro: nonfocal MSK: no peripheral joint swelling/tenderness/warmth; back spines nontender       Lab Results Lab Results  Component Value Date   WBC 14.4 (H) 08/24/2023   HGB 9.4 (L) 08/24/2023   HCT 31.1 (L) 08/24/2023   MCV 99.7 08/24/2023   PLT 229 08/24/2023    Lab Results  Component Value Date   CREATININE 0.98 08/24/2023   BUN 18 08/24/2023   NA 134 (L) 08/24/2023   K 3.5 08/24/2023   CL 104 08/24/2023   CO2 19 (L) 08/24/2023    Lab Results  Component Value Date   ALT 11 08/23/2023   AST 27 08/23/2023   ALKPHOS 65  08/23/2023   BILITOT 0.8 08/23/2023      Microbiology: Recent Results (from the past 240 hours)  Culture, blood (Routine x 2)     Status: None (Preliminary result)   Collection Time: 08/23/23  9:55 AM   Specimen: BLOOD LEFT HAND  Result Value Ref Range Status   Specimen Description   Final    BLOOD LEFT HAND Performed at [facility], [address] [place], [place] [address]    Special Requests   Final    BOTTLES DRAWN AEROBIC AND ANAEROBIC Blood Culture adequate volume Performed at [facility], [address], [place], [place] [address]    Culture  Setup Time  Final    GRAM POSITIVE COCCI IN CLUSTERS ANAEROBIC BOTTLE ONLY CRITICAL RESULT CALLED TO, READ BACK BY AND VERIFIED WITH: PHARMD J.LEGGE AT 1145 ON 08/24/2023 BY T.SAAD. Performed at [facility], [address], [place], [place] [address]    Culture GRAM POSITIVE COCCI  Final   Report Status PENDING  Incomplete  Blood Culture ID Panel (Reflexed)     Status: Abnormal   Collection Time: 08/23/23  9:55 AM  Result Value Ref Range Status   Enterococcus faecalis NOT DETECTED NOT DETECTED Final   Enterococcus Faecium NOT DETECTED NOT DETECTED Final   Listeria monocytogenes NOT DETECTED NOT DETECTED Final   Staphylococcus species DETECTED (A) NOT DETECTED Final    Comment: CRITICAL RESULT CALLED TO, READ BACK BY AND VERIFIED WITH: PHARMD J.LEGGE AT 1145 ON 08/24/2023 BY T.SAAD.    Staphylococcus aureus (BCID) NOT DETECTED NOT DETECTED Final   Staphylococcus epidermidis DETECTED (A) NOT DETECTED Final    Comment: Methicillin (oxacillin) resistant coagulase negative staphylococcus. Possible blood culture contaminant (unless isolated from more than one blood culture draw or clinical case suggests pathogenicity). No antibiotic treatment is indicated for blood  culture contaminants. CRITICAL RESULT CALLED TO, READ BACK BY AND VERIFIED WITH: PHARMD J.LEGGE AT 1145 ON 08/24/2023 BY T.SAAD.     Staphylococcus lugdunensis NOT DETECTED NOT DETECTED Final   Streptococcus species NOT DETECTED NOT DETECTED Final   Streptococcus agalactiae NOT DETECTED NOT DETECTED Final   Streptococcus pneumoniae NOT DETECTED NOT DETECTED Final   Streptococcus pyogenes NOT DETECTED NOT DETECTED Final   A.calcoaceticus-baumannii NOT DETECTED NOT DETECTED Final   Bacteroides fragilis NOT DETECTED NOT DETECTED Final   Enterobacterales NOT DETECTED NOT DETECTED Final   Enterobacter cloacae complex NOT DETECTED NOT DETECTED Final   Escherichia coli NOT DETECTED NOT DETECTED Final   Klebsiella aerogenes NOT DETECTED NOT DETECTED Final   Klebsiella oxytoca NOT DETECTED NOT DETECTED Final   Klebsiella pneumoniae NOT DETECTED NOT DETECTED Final   Proteus species NOT DETECTED NOT DETECTED Final   Salmonella species NOT DETECTED NOT DETECTED Final   Serratia marcescens NOT DETECTED NOT DETECTED Final   Haemophilus influenzae NOT DETECTED NOT DETECTED Final   Neisseria meningitidis NOT DETECTED NOT DETECTED Final   Pseudomonas aeruginosa NOT DETECTED NOT DETECTED Final   Stenotrophomonas maltophilia NOT DETECTED NOT DETECTED Final   Candida albicans NOT DETECTED NOT DETECTED Final   Candida auris NOT DETECTED NOT DETECTED Final   Candida glabrata NOT DETECTED NOT DETECTED Final   Candida krusei NOT DETECTED NOT DETECTED Final   Candida parapsilosis NOT DETECTED NOT DETECTED Final   Candida tropicalis NOT DETECTED NOT DETECTED Final   Cryptococcus neoformans/gattii NOT DETECTED NOT DETECTED Final   Methicillin resistance mecA/C DETECTED (A) NOT DETECTED Final    Comment: CRITICAL RESULT CALLED TO, READ BACK BY AND VERIFIED WITH: PHARMD J.LEGGE AT 1145 ON 08/24/2023 BY T.SAAD. Performed at [facility], [address] [place], [place] [address]   Culture, blood (Routine x 2)     Status: None (Preliminary result)   Collection Time: 08/23/23 10:15 AM   Specimen: BLOOD  Result Value Ref Range Status    Specimen Description   Final    BLOOD RIGHT ANTECUBITAL Performed at [facility], [address], [place], [place] [address]    Special Requests   Final    BOTTLES DRAWN AEROBIC AND ANAEROBIC Blood Culture adequate volume Performed at [facility], [address], [place], [place] [address]  Culture   Final    NO GROWTH < 24 HOURS Performed at [facility], [address] [place], [place] [address]    Report Status PENDING  Incomplete  Aerobic Culture w Gram Stain (superficial specimen)     Status: None (Preliminary result)   Collection Time: 08/23/23 10:55 AM   Specimen: Wound  Result Value Ref Range Status   Specimen Description   Final    WOUND LEFT BREAST Performed at [facility], [address], [place], [place] [address]    Special Requests   Final    NONE Performed at [facility], [address], [place], [place] [address]    Gram Stain   Final    MODERATE WBC PRESENT, PREDOMINANTLY PMN FEW GRAM POSITIVE COCCI IN PAIRS    Culture   Final    FEW STAPHYLOCOCCUS AUREUS SUSCEPTIBILITIES TO FOLLOW Performed at [facility], [address], [place], [place] [address]    Report Status PENDING  Incomplete  Resp panel by RT-PCR (RSV, Flu A&B, Covid) Anterior Nasal Swab     Status: None   Collection Time: 08/23/23 10:55 AM   Specimen: Anterior Nasal Swab  Result Value Ref Range Status   SARS Coronavirus 2 by RT PCR NEGATIVE NEGATIVE Final    Comment: (NOTE) SARS-CoV-2 target nucleic acids are NOT DETECTED.  The SARS-CoV-2 RNA is generally detectable in upper respiratory specimens during the acute phase of infection. The lowest concentration of SARS-CoV-2 viral copies this assay can detect is 138 copies/mL. A negative result does not preclude SARS-Cov-2 infection and should not be used as the sole basis for treatment or other patient management decisions. A negative result may  occur with  improper specimen collection/handling, submission of specimen other than nasopharyngeal swab, presence of viral mutation(s) within the areas targeted by this assay, and inadequate number of viral copies(<138 copies/mL). A negative result must be combined with clinical observations, patient history, and epidemiological information. The expected result is Negative.  Fact Sheet for Patients:  BloggerCourse.com  Fact Sheet for Healthcare Providers:  SeriousBroker.it  This test is no t yet approved or cleared by the [place] FDA and  has been authorized for detection and/or diagnosis of SARS-CoV-2 by FDA under an Emergency Use Authorization (EUA). This EUA will remain  in effect (meaning this test can be used) for the duration of the COVID-19 declaration under Section 564(b)(1) of the Act, 21 U.S.C.section 360bbb-3(b)(1), unless the authorization is terminated  or revoked sooner.       Influenza A by PCR NEGATIVE NEGATIVE Final   Influenza B by PCR NEGATIVE NEGATIVE Final    Comment: (NOTE) The Xpert Xpress SARS-CoV-2/FLU/RSV plus assay is intended as an aid in the diagnosis of influenza from Nasopharyngeal swab specimens and should not be used as a sole basis for treatment. Nasal washings and aspirates are unacceptable for Xpert Xpress SARS-CoV-2/FLU/RSV testing.  Fact Sheet for Patients: BloggerCourse.com  Fact Sheet for Healthcare Providers: SeriousBroker.it  This test is not yet approved or cleared by the [place] FDA and has been authorized for detection and/or diagnosis of SARS-CoV-2 by FDA under an Emergency Use Authorization (EUA). This EUA will remain in effect (meaning this test can be used) for the duration of the COVID-19 declaration under Section 564(b)(1) of the Act, 21 U.S.C. section 360bbb-3(b)(1), unless the authorization is terminated  or revoked.     Resp Syncytial Virus by PCR NEGATIVE NEGATIVE Final    Comment: (  NOTE) Fact Sheet for Patients: BloggerCourse.com  Fact Sheet for Healthcare Providers: SeriousBroker.it  This test is not yet approved or cleared by the [place] FDA and has been authorized for detection and/or diagnosis of SARS-CoV-2 by FDA under an Emergency Use Authorization (EUA). This EUA will remain in effect (meaning this test can be used) for the duration of the COVID-19 declaration under Section 564(b)(1) of the Act, 21 U.S.C. section 360bbb-3(b)(1), unless the authorization is terminated or revoked.  Performed at [facility], [address], [place], [place] [address]      Serology:    Imaging: If present, new imagings (plain films, ct scans, and mri) have been personally visualized and interpreted; radiology reports have been reviewed. Decision making incorporated into the Impression / Recommendations.  4/3 ct chest abd pelv with contrast 1. No acute localizing findings in the abdomen or pelvis. 2. Partially visualized fluid collection along the right lateral chest wall, with surrounding surgical clips, likely reflects a postsurgical fluid collection of indeterminate sterility related to interval bilateral mastectomies. This region was not included within the field of view on the same-day CT chest. 3. Sigmoid colonic diverticulosis without evidence of acute diverticulitis. 4. Splenomegaly.  Raymondo Band, MD [facility] [phone] pager    08/24/2023, 11:54 AM

## 2023-08-24 NOTE — Consult Note (Signed)
 WOC consulted for NPWT placement for the left breast wound, discussed with surgery PA. Will begin NPWT 4/4 am once the patient has been transferred to inpatient unit.   CCS PA has written orders for wound care in the interim.   Michaell Grider [facility], CNS, [employer] [phone]

## 2023-08-24 NOTE — Progress Notes (Signed)
 PHARMACY - PHYSICIAN COMMUNICATION CRITICAL VALUE ALERT - BLOOD CULTURE IDENTIFICATION (BCID)  Diamond Marshall is an 53 y.o. female who presented to [facility] on 08/23/2023 with a chief complaint of  Chief Complaint  Patient presents with   Fever   Wound Infection     Assessment: 1 (anaerobic) of 4 - GPC - per BCID, staph epi mec A detected   Name of physician (or Provider) Contacted: Dr. Renold Don  Current antibiotics:  - Vancomycin  Changes to prescribed antibiotics recommended:  - ID says no changes for now. Plan to obtain repeat cultures  Results for orders placed or performed during the hospital encounter of 08/23/23  Blood Culture ID Panel (Reflexed) (Collected: 08/23/2023  9:55 AM)  Result Value Ref Range   Enterococcus faecalis NOT DETECTED NOT DETECTED   Enterococcus Faecium NOT DETECTED NOT DETECTED   Listeria monocytogenes NOT DETECTED NOT DETECTED   Staphylococcus species DETECTED (A) NOT DETECTED   Staphylococcus aureus (BCID) NOT DETECTED NOT DETECTED   Staphylococcus epidermidis DETECTED (A) NOT DETECTED   Staphylococcus lugdunensis NOT DETECTED NOT DETECTED   Streptococcus species NOT DETECTED NOT DETECTED   Streptococcus agalactiae NOT DETECTED NOT DETECTED   Streptococcus pneumoniae NOT DETECTED NOT DETECTED   Streptococcus pyogenes NOT DETECTED NOT DETECTED   A.calcoaceticus-baumannii NOT DETECTED NOT DETECTED   Bacteroides fragilis NOT DETECTED NOT DETECTED   Enterobacterales NOT DETECTED NOT DETECTED   Enterobacter cloacae complex NOT DETECTED NOT DETECTED   Escherichia coli NOT DETECTED NOT DETECTED   Klebsiella aerogenes NOT DETECTED NOT DETECTED   Klebsiella oxytoca NOT DETECTED NOT DETECTED   Klebsiella pneumoniae NOT DETECTED NOT DETECTED   Proteus species NOT DETECTED NOT DETECTED   Salmonella species NOT DETECTED NOT DETECTED   Serratia marcescens NOT DETECTED NOT DETECTED   Haemophilus influenzae NOT DETECTED NOT DETECTED   Neisseria meningitidis NOT  DETECTED NOT DETECTED   Pseudomonas aeruginosa NOT DETECTED NOT DETECTED   Stenotrophomonas maltophilia NOT DETECTED NOT DETECTED   Candida albicans NOT DETECTED NOT DETECTED   Candida auris NOT DETECTED NOT DETECTED   Candida glabrata NOT DETECTED NOT DETECTED   Candida krusei NOT DETECTED NOT DETECTED   Candida parapsilosis NOT DETECTED NOT DETECTED   Candida tropicalis NOT DETECTED NOT DETECTED   Cryptococcus neoformans/gattii NOT DETECTED NOT DETECTED   Methicillin resistance mecA/C DETECTED (A) NOT DETECTED     Adalberto Cole, PharmD, BCPS 08/24/2023 12:15 PM

## 2023-08-25 DIAGNOSIS — L0390 Cellulitis, unspecified: Secondary | ICD-10-CM | POA: Diagnosis not present

## 2023-08-25 DIAGNOSIS — A419 Sepsis, unspecified organism: Secondary | ICD-10-CM | POA: Diagnosis not present

## 2023-08-25 LAB — BASIC METABOLIC PANEL WITH GFR
Anion gap: 8 (ref 5–15)
BUN: 14 mg/dL (ref 6–20)
CO2: 22 mmol/L (ref 22–32)
Calcium: 8.2 mg/dL — ABNORMAL LOW (ref 8.9–10.3)
Chloride: 106 mmol/L (ref 98–111)
Creatinine, Ser: 0.9 mg/dL (ref 0.44–1.00)
GFR, Estimated: >60 mL/min (ref >60)
Glucose, Bld: 96 mg/dL (ref 70–99)
Potassium: 3.4 mmol/L — ABNORMAL LOW (ref 3.5–5.1)
Sodium: 136 mmol/L (ref 135–145)

## 2023-08-25 LAB — AEROBIC CULTURE W GRAM STAIN (SUPERFICIAL SPECIMEN)

## 2023-08-25 LAB — CBC
HCT: 27 % — ABNORMAL LOW (ref 36.0–46.0)
Hemoglobin: 8.3 g/dL — ABNORMAL LOW (ref 12.0–15.0)
MCH: 30.1 pg (ref 26.0–34.0)
MCHC: 30.7 g/dL (ref 30.0–36.0)
MCV: 97.8 fL (ref 80.0–100.0)
Platelets: 230 10*3/uL (ref 150–400)
RBC: 2.76 MIL/uL — ABNORMAL LOW (ref 3.87–5.11)
RDW: 17.2 % — ABNORMAL HIGH (ref 11.5–15.5)
WBC: 8.1 10*3/uL (ref 4.0–10.5)
nRBC: 0 % (ref 0.0–0.2)

## 2023-08-25 MED ORDER — POTASSIUM CHLORIDE CRYS ER 20 MEQ PO TBCR
40.0000 meq | EXTENDED_RELEASE_TABLET | Freq: Once | ORAL | Status: AC
  Administered 2023-08-25: 40 meq via ORAL
  Filled 2023-08-25: qty 2

## 2023-08-25 NOTE — Progress Notes (Addendum)
 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  Patient was admitted for the management of sepsis secondary to cellulitis of bilateral mastectomy wounds.  General surgery, oncology following.  Wound culture showed MRSA.  ID consulted and following  Assessment & Plan:  Principal Problem:   Sepsis due to cellulitis (HCC)  Sepsis secondary to cellulitis of the bilateral  breast: Presented with fever, leukocytosis.  Elevated lactate at 2 on presentation, blood pressure was soft.   Left-sided breast wound culture showed MRSA. One of the  blood cultures showing staph epidermidis,cud be contamination.  ID consulted.  Continue current antibiotics.  Repeat blood cultures have been sent. She was febrile this morning.  Leukocytosis resolved  Status post bilateral mastectomy: History of inflammatory breast cancer.status post bilateral simple mastectomy with lymph node dissection on 3/6.  Has wounds on bilateral breasts. Has wound vac on the left. General surgery  Hypertension: On Coreg at home.  Currently on hold  Hypokalemia: Supplemented with  potassium  Depression: On Lexapro  GERD: Protonix  Morbid obesity: BMI 52.6           DVT prophylaxis:Lovenox     Code Status: Full Code  Family Communication: None at bedside  Patient status:Inpatient  Patient is from :Home  Anticipated discharge WJ:XBJY  Estimated DC date: After finalization of antibiotics.  Clearance from surgery, ID   Consultants: ID,general surgery  Procedures:None  Antimicrobials:  Anti-infectives (From admission, onward)     Start     Dose/Rate Route Frequency Ordered Stop   08/24/23 1700  vancomycin (VANCOREADY) IVPB 1250 mg/250 mL        1,250 mg 166.7 mL/hr over 90 Minutes Intravenous Every 24 hours 08/23/23 1651     08/24/23 1100  vancomycin (VANCOREADY) IVPB 1250 mg/250 mL  Status:  Discontinued        1,250 mg 166.7 mL/hr over 90 Minutes Intravenous Every 24 hours 08/23/23 1608 08/23/23 1651   08/23/23 1800  ceFEPIme (MAXIPIME) 2 g in sodium chloride 0.9 % 100 mL IVPB  Status:  Discontinued        2 g 200 mL/hr over 30 Minutes Intravenous Every 8 hours 08/23/23 1608 08/24/23 1119   08/23/23 1000  ceFEPIme (MAXIPIME) 2 g in sodium chloride 0.9 % 100 mL IVPB        2 g 200 mL/hr over 30 Minutes Intravenous  Once 08/23/23 0951 08/23/23 1054   08/23/23 1000  vancomycin (VANCOREADY) IVPB 2000 mg/400 mL        2,000 mg 200 mL/hr over 120 Minutes Intravenous  Once 08/23/23 0951 08/23/23 1418   08/23/23 0945  metroNIDAZOLE (FLAGYL) IVPB 500 mg  Status:  Discontinued        500 mg 100 mL/hr over 60 Minutes Intravenous Every 12 hours 08/23/23 0944 08/24/23 1119       Subjective: Patient seen and examined at the bedside today.  Hemodynamically stable.  Earlier this morning, she was febrile up to 101.  During evaluation, she was afebrile.  She continues to complain of copious drainage from her right breast wound  Objective: Vitals:   08/24/23 1307 08/24/23 2002 08/25/23 0354 08/25/23 0528  BP: 115/66 97/71 122/66  Pulse: 100 95 98   Resp: 18 14 20    Temp: 99.5 F (37.5 C) 99.7 F (37.6 C) (!) 101.1 F (38.4 C) (!) 97.2 F (36.2 C)  TempSrc: Oral Oral Oral Oral  SpO2: 94% 94% 94%   Weight:      Height:        Intake/Output Summary (Last 24 hours) at 08/25/2023 1027 Last data filed at 08/24/2023 2200 Gross per 24 hour  Intake 709.81 ml  Output --  Net 709.81 ml   Filed Weights   08/24/23 0533  Weight: 126.4 kg    Examination:   General exam: Overall comfortable, not in distress,morbidly  obese HEENT: PERRL Respiratory system:  no wheezes or crackles  Cardiovascular system: S1 & S2 heard, RRR.  Chest: Bilateral mastectomy wound.  Wound VAC on the left breast Gastrointestinal system: Abdomen is nondistended, soft and nontender. Central nervous system: Alert and oriented Extremities: No edema, no clubbing ,no cyanosis Skin: No rashes, no ulcers,no icterus      Data Reviewed: I have personally reviewed following labs and imaging studies  CBC: Recent Labs  Lab 08/23/23 0940 08/24/23 0504 08/25/23 0519  WBC 18.1* 14.4* 8.1  NEUTROABS 16.1*  --   --   HGB 9.7* 9.4* 8.3*  HCT 31.3* 31.1* 27.0*  MCV 98.1 99.7 97.8  PLT 286 229 230   Basic Metabolic Panel: Recent Labs  Lab 08/23/23 0940 08/24/23 0504 08/25/23 0519  NA 135 134* 136  K 3.5 3.5 3.4*  CL 101 104 106  CO2 21* 19* 22  GLUCOSE 115* 92 96  BUN 12 18 14   CREATININE 1.07* 0.98 0.90  CALCIUM 8.5* 8.2* 8.2*     Recent Results (from the past 240 hours)  Culture, blood (Routine x 2)     Status: Abnormal (Preliminary result)   Collection Time: 08/23/23  9:55 AM   Specimen: BLOOD LEFT HAND  Result Value Ref Range Status   Specimen Description   Final    BLOOD LEFT HAND Performed at [facility], [address], [place], [place] [address]    Special Requests   Final    BOTTLES DRAWN AEROBIC AND ANAEROBIC Blood Culture adequate volume Performed at [facility], [address] [place], [place] [address]    Culture  Setup Time   Final    GRAM POSITIVE COCCI IN CLUSTERS ANAEROBIC BOTTLE ONLY CRITICAL RESULT CALLED TO, READ BACK BY AND VERIFIED WITH: PHARMD J.LEGGE AT 1145 ON 08/24/2023 BY T.SAAD. Performed at [facility], [address], [place], [place] [address]    Culture STAPHYLOCOCCUS EPIDERMIDIS (A)  Final   Report Status PENDING  Incomplete  Blood Culture ID Panel (Reflexed)     Status: Abnormal   Collection Time: 08/23/23  9:55 AM  Result Value Ref Range  Status   Enterococcus faecalis NOT DETECTED NOT DETECTED Final   Enterococcus Faecium NOT DETECTED NOT DETECTED Final   Listeria monocytogenes NOT DETECTED NOT DETECTED Final   Staphylococcus species DETECTED (A) NOT DETECTED Final    Comment: CRITICAL RESULT CALLED TO, READ BACK BY AND VERIFIED WITH: PHARMD J.LEGGE AT 1145 ON 08/24/2023 BY T.SAAD.    Staphylococcus aureus (BCID) NOT DETECTED NOT DETECTED Final   Staphylococcus epidermidis DETECTED (A) NOT DETECTED Final    Comment: Methicillin (oxacillin) resistant coagulase negative staphylococcus. Possible blood culture contaminant (unless isolated from more than one blood culture draw or clinical case suggests pathogenicity). No antibiotic treatment is indicated for blood  culture contaminants. CRITICAL RESULT CALLED TO, READ BACK BY AND VERIFIED WITH: PHARMD J.LEGGE AT 1145 ON 08/24/2023 BY T.SAAD.    Staphylococcus lugdunensis NOT DETECTED NOT DETECTED Final   Streptococcus species NOT DETECTED NOT DETECTED Final   Streptococcus agalactiae NOT DETECTED NOT DETECTED Final   Streptococcus pneumoniae NOT DETECTED NOT DETECTED Final   Streptococcus pyogenes NOT DETECTED NOT DETECTED Final   A.calcoaceticus-baumannii NOT DETECTED NOT DETECTED Final   Bacteroides fragilis NOT DETECTED NOT DETECTED Final   Enterobacterales NOT DETECTED NOT DETECTED Final   Enterobacter cloacae complex NOT DETECTED NOT DETECTED Final   Escherichia coli NOT DETECTED NOT DETECTED Final   Klebsiella aerogenes NOT DETECTED NOT DETECTED Final   Klebsiella oxytoca NOT DETECTED NOT DETECTED Final   Klebsiella pneumoniae NOT DETECTED NOT DETECTED Final   Proteus species NOT DETECTED NOT DETECTED Final   Salmonella species NOT DETECTED NOT DETECTED Final   Serratia marcescens NOT DETECTED NOT DETECTED Final   Haemophilus influenzae NOT DETECTED NOT DETECTED Final   Neisseria meningitidis NOT DETECTED NOT DETECTED Final   Pseudomonas aeruginosa NOT DETECTED NOT  DETECTED Final   Stenotrophomonas maltophilia NOT DETECTED NOT DETECTED Final   Candida albicans NOT DETECTED NOT DETECTED Final   Candida auris NOT DETECTED NOT DETECTED Final   Candida glabrata NOT DETECTED NOT DETECTED Final   Candida krusei NOT DETECTED NOT DETECTED Final   Candida parapsilosis NOT DETECTED NOT DETECTED Final   Candida tropicalis NOT DETECTED NOT DETECTED Final   Cryptococcus neoformans/gattii NOT DETECTED NOT DETECTED Final   Methicillin resistance mecA/C DETECTED (A) NOT DETECTED Final    Comment: CRITICAL RESULT CALLED TO, READ BACK BY AND VERIFIED WITH: PHARMD J.LEGGE AT 1145 ON 08/24/2023 BY T.SAAD. Performed at [facility], [address], [place], [place] [address]   Culture, blood (Routine x 2)     Status: None (Preliminary result)   Collection Time: 08/23/23 10:15 AM   Specimen: BLOOD  Result Value Ref Range Status   Specimen Description   Final    BLOOD RIGHT ANTECUBITAL Performed at [facility], [address], [place], [place] [address]    Special Requests   Final    BOTTLES DRAWN AEROBIC AND ANAEROBIC Blood Culture adequate volume Performed at [facility], [address], [place], [place] [address]    Culture   Final    NO GROWTH 2 DAYS Performed at [facility], [address], [place], [place] [address]    Report Status PENDING  Incomplete  Aerobic Culture w Gram Stain (superficial specimen)     Status: None   Collection Time: 08/23/23 10:55 AM   Specimen: Wound  Result Value Ref Range Status   Specimen Description   Final    WOUND LEFT BREAST Performed at [facility], [address], [place], [place] [address]    Special Requests   Final    NONE Performed at [facility], [address], [place], [place] [address]    Gram Stain   Final    MODERATE WBC PRESENT, PREDOMINANTLY PMN FEW GRAM POSITIVE COCCI IN PAIRS Performed at [facility], [address], [place], [place] [address]    Culture FEW METHICILLIN RESISTANT STAPHYLOCOCCUS AUREUS  Final   Report Status 08/25/2023 FINAL  Final   Organism ID, Bacteria METHICILLIN RESISTANT STAPHYLOCOCCUS AUREUS  Final      Susceptibility   Methicillin resistant staphylococcus aureus - MIC*  CIPROFLOXACIN 4 RESISTANT Resistant     ERYTHROMYCIN >=8 RESISTANT Resistant     GENTAMICIN <=0.5 SENSITIVE Sensitive     OXACILLIN >=4 RESISTANT Resistant     TETRACYCLINE >=16 RESISTANT Resistant     VANCOMYCIN 1 SENSITIVE Sensitive     TRIMETH/SULFA <=10 SENSITIVE Sensitive     CLINDAMYCIN >=8 RESISTANT Resistant     RIFAMPIN <=0.5 SENSITIVE Sensitive     Inducible Clindamycin NEGATIVE Sensitive     LINEZOLID 2 SENSITIVE Sensitive     * FEW METHICILLIN RESISTANT STAPHYLOCOCCUS AUREUS  Resp panel by RT-PCR (RSV, Flu A&B, Covid) Anterior Nasal Swab     Status: None   Collection Time: 08/23/23 10:55 AM   Specimen: Anterior Nasal Swab  Result Value Ref Range Status   SARS Coronavirus 2 by RT PCR NEGATIVE NEGATIVE Final    Comment: (NOTE) SARS-CoV-2 target nucleic acids are NOT DETECTED.  The SARS-CoV-2 RNA is generally detectable in upper respiratory specimens during the acute phase of infection. The lowest concentration of SARS-CoV-2 viral copies this assay can detect is 138 copies/mL. A negative result does not preclude SARS-Cov-2 infection and should not be used as the sole basis for treatment or other patient management decisions. A negative result may occur with  improper specimen collection/handling, submission of specimen other than nasopharyngeal swab, presence of viral mutation(s) within the areas targeted by this assay, and inadequate number of viral copies(<138 copies/mL). A negative result must be combined with clinical observations, patient history, and epidemiological information. The expected result is Negative.  Fact Sheet for Patients:   BloggerCourse.com  Fact Sheet for Healthcare Providers:  SeriousBroker.it  This test is no t yet approved or cleared by the [place] FDA and  has been authorized for detection and/or diagnosis of SARS-CoV-2 by FDA under an Emergency Use Authorization (EUA). This EUA will remain  in effect (meaning this test can be used) for the duration of the COVID-19 declaration under Section 564(b)(1) of the Act, 21 U.S.C.section 360bbb-3(b)(1), unless the authorization is terminated  or revoked sooner.       Influenza A by PCR NEGATIVE NEGATIVE Final   Influenza B by PCR NEGATIVE NEGATIVE Final    Comment: (NOTE) The Xpert Xpress SARS-CoV-2/FLU/RSV plus assay is intended as an aid in the diagnosis of influenza from Nasopharyngeal swab specimens and should not be used as a sole basis for treatment. Nasal washings and aspirates are unacceptable for Xpert Xpress SARS-CoV-2/FLU/RSV testing.  Fact Sheet for Patients: BloggerCourse.com  Fact Sheet for Healthcare Providers: SeriousBroker.it  This test is not yet approved or cleared by the [place] FDA and has been authorized for detection and/or diagnosis of SARS-CoV-2 by FDA under an Emergency Use Authorization (EUA). This EUA will remain in effect (meaning this test can be used) for the duration of the COVID-19 declaration under Section 564(b)(1) of the Act, 21 U.S.C. section 360bbb-3(b)(1), unless the authorization is terminated or revoked.     Resp Syncytial Virus by PCR NEGATIVE NEGATIVE Final    Comment: (NOTE) Fact Sheet for Patients: BloggerCourse.com  Fact Sheet for Healthcare Providers: SeriousBroker.it  This test is not yet approved or cleared by the [place] FDA and has been authorized for detection and/or diagnosis of SARS-CoV-2 by FDA under an Emergency Use  Authorization (EUA). This EUA will remain in effect (meaning this test can be used) for the duration of the COVID-19 declaration under Section 564(b)(1) of the Act, 21 U.S.C. section 360bbb-3(b)(1), unless the authorization is terminated or revoked.  Performed at [facility], [address] [place], [place] [address]   Aerobic Culture w Gram Stain (superficial specimen)     Status: None (Preliminary result)   Collection Time: 08/24/23  2:48 PM   Specimen: Breast  Result Value Ref Range Status   Specimen Description   Final    BREAST Performed at [facility], [address], [place], [place] [address]    Special Requests   Final    RIGHT Performed at [facility], [address], [place], [place] [address]    Gram Stain   Final    ABUNDANT WBC PRESENT, PREDOMINANTLY PMN RARE GRAM POSITIVE COCCI Performed at [facility], [address], [place], [place] [address]    Culture PENDING  Incomplete   Report Status PENDING  Incomplete     Radiology Studies: CT ABDOMEN PELVIS W CONTRAST Result Date: 08/23/2023 CLINICAL DATA:  Left upper quadrant pain. EXAM: CT ABDOMEN AND PELVIS WITH CONTRAST TECHNIQUE: Multidetector CT imaging of the abdomen and pelvis was performed using the standard protocol following bolus administration of intravenous contrast. RADIATION DOSE REDUCTION: This exam was performed according to the departmental dose-optimization program which includes automated exposure control, adjustment of the mA and/or kV according to patient size and/or use of iterative reconstruction technique. CONTRAST:  OMNIPAQUE IOHEXOL 350 MG/ML SOLN COMPARISON:  CTA chest, abdomen, and pelvis dated 05/22/2023. FINDINGS: Lower chest: Please refer to the same-day CT chest for description of intrathoracic findings. Hepatobiliary: No focal liver abnormality is seen. No gallstones, gallbladder wall thickening, or biliary dilatation.  Pancreas: Unremarkable. No pancreatic ductal dilatation or surrounding inflammatory changes. Spleen: Splenomegaly measuring up to 14.6 cm in craniocaudal dimension. No focal lesion. Adrenals/Urinary Tract: Adrenal glands are unremarkable. Kidneys are normal, without renal calculi, focal lesion, or hydronephrosis. Bladder is unremarkable. Stomach/Bowel: Stomach is within normal limits. Appendix appears normal. No evidence of bowel wall thickening, distention, or inflammatory changes. Sigmoid colonic diverticulosis without evidence of acute diverticulitis. Vascular/Lymphatic: Abdominal aorta is normal in caliber with mild aortic atherosclerosis. No enlarged abdominopelvic lymph nodes. Reproductive: Uterus and bilateral adnexa are unremarkable. Other: No abdominopelvic ascites.  No intraperitoneal free air. Partially visualized area of loculated fluid density along the right lateral chest wall (series 4, image 99), with surrounding surgical clips, likely reflects a postsurgical fluid collection related to interval bilateral mastectomies. This region was not included within the field of view on the same-day CT chest. Musculoskeletal: No acute osseous abnormality. No suspicious osseous lesion. IMPRESSION: 1. No acute localizing findings in the abdomen or pelvis. 2. Partially visualized fluid collection along the right lateral chest wall, with surrounding surgical clips, likely reflects a postsurgical fluid collection of indeterminate sterility related to interval bilateral mastectomies. This region was not included within the field of view on the same-day CT chest. 3. Sigmoid colonic diverticulosis without evidence of acute diverticulitis. 4. Splenomegaly. Electronically Signed   By: Hart Robinsons M.D.   On: 08/23/2023 14:47   CT Angio Chest PE W and/or Wo Contrast Result Date: 08/23/2023 CLINICAL DATA:  Pulmonary embolism (PE) suspected, high prob EXAM: CT ANGIOGRAPHY CHEST WITH CONTRAST TECHNIQUE: Multidetector CT  imaging of the chest was performed using the standard protocol during bolus administration of intravenous contrast. Multiplanar CT image reconstructions and MIPs were obtained to evaluate the vascular anatomy. RADIATION DOSE REDUCTION: This exam was performed according to the departmental dose-optimization program which includes automated exposure control, adjustment of the mA and/or kV according to patient  size and/or use of iterative reconstruction technique. CONTRAST:  OMNIPAQUE IOHEXOL 350 MG/ML SOLN COMPARISON:  CTA chest dated 05/22/2023. FINDINGS: Cardiovascular: Satisfactory opacification of the pulmonary arteries to the segmental level. No evidence of pulmonary embolism. Normal heart size. No pericardial effusion. Thoracic aorta is normal in caliber. Catheter tip again noted at the superior cavoatrial junction. Mediastinum/Nodes: No enlarged mediastinal, hilar, or axillary lymph nodes. Similar 12 mm right-sided thyroid nodule. Trachea and esophagus demonstrate no significant findings. Lungs/Pleura: Bilateral mosaic attenuation. No dense focal consolidation. Minimal bibasilar atelectasis. No pleural effusion or pneumothorax. No suspicious pulmonary nodule identified. Upper Abdomen: No acute abnormality. Musculoskeletal: Status post interval bilateral mastectomies. Large left chest wall wound extending along the mastectomy site. Regions of curvilinear air extending along the chest left chest wall from the level of the open wound likely contiguous with overlying skin flaps. This large wound/soft tissue defect measures approximately 12.8 cm transverse by 10.8 cm craniocaudal. No definite soft tissue gas is identified. There is surrounding associated stranding with overlying cutaneous thickening. Ill-defined fluid extending along subcutaneous tissues of the right chest wall mastectomy site, overlying the right pectoralis major muscle. No sizable loculated collection identified. There is surrounding  stranding and overlying cutaneous thickening. Scattered surgical clips. No acute osseous abnormality.  No suspicious osseous lesion. Review of the MIP images confirms the above findings. IMPRESSION: 1. No evidence of pulmonary embolism. 2. Status post interval bilateral mastectomies. Large left chest wall wound and soft tissue defect extending along the mastectomy site. No discrete fluid collection identified. Ill-defined fluid extending along subcutaneous tissues of the right chest wall mastectomy site, overlying the right pectoralis major muscle. No sizable loculated collection identified. Cellulitis can not be excluded. 3. Bilateral mosaic attenuation could reflect air trapping secondary to a small airways infectious/inflammatory etiology. 4. Similar right sided thyroid nodule. Consider further evaluation with nonemergent ultrasound. Electronically Signed   By: Hart Robinsons M.D.   On: 08/23/2023 14:37   DG Chest 2 View Result Date: 08/23/2023 CLINICAL DATA:  Sepsis.  Metastatic breast carcinoma. EXAM: CHEST - 2 VIEW COMPARISON:  07/09/2023 FINDINGS: The heart size and mediastinal contours are within normal limits. Right-sided Port-A-Cath remains in appropriate position. Both lungs are clear. The visualized skeletal structures are unremarkable. IMPRESSION: No active cardiopulmonary disease. Electronically Signed   By: Danae Orleans M.D.   On: 08/23/2023 11:25    Scheduled Meds:  cycloSPORINE  1 drop Both Eyes BID   docusate sodium  100 mg Oral BID   enoxaparin (LOVENOX) injection  60 mg Subcutaneous Q24H   escitalopram  20 mg Oral Daily   feeding supplement  237 mL Oral BID BM   pantoprazole  40 mg Oral Daily   Continuous Infusions:  vancomycin 1,250 mg (08/24/23 1718)     LOS: 2 days   Burnadette Pop, MD [facility] P4/09/2023, 10:27 AM

## 2023-08-25 NOTE — Progress Notes (Signed)
 Subjective/Chief Complaint: Pt feels much better Still having a lot of drainage from the right chest when she stands.   Fever curve improved   Objective: Vital signs in last 24 hours: Temp:  [97.2 F (36.2 C)-101.1 F (38.4 C)] 97.2 F (36.2 C) (04/05 0528) Pulse Rate:  [95-100] 98 (04/05 0354) Resp:  [14-20] 20 (04/05 0354) BP: (97-122)/(66-71) 122/66 (04/05 0354) SpO2:  [94 %] 94 % (04/05 0354) Last BM Date : 08/23/23  Intake/Output from previous day: 04/04 0701 - 04/05 0700 In: 709.8 [P.O.:420; IV Piggyback:289.8] Out: -  Intake/Output this shift: No intake/output data recorded.  Exam: Awake and alert Looks comfortable Left chest VAC in place Right chest cellulitis improved Port site non tender and no erythema  Lab Results:  Recent Labs    08/24/23 0504 08/25/23 0519  WBC 14.4* 8.1  HGB 9.4* 8.3*  HCT 31.1* 27.0*  PLT 229 230   BMET Recent Labs    08/24/23 0504 08/25/23 0519  NA 134* 136  K 3.5 3.4*  CL 104 106  CO2 19* 22  GLUCOSE 92 96  BUN 18 14  CREATININE 0.98 0.90  CALCIUM 8.2* 8.2*   PT/INR Recent Labs    08/23/23 0940  LABPROT 16.1*  INR 1.3*   ABG No results for input(s): "PHART", "HCO3" in the last 72 hours.  Invalid input(s): "PCO2", "PO2"  Studies/Results: CT ABDOMEN PELVIS W CONTRAST Result Date: 08/23/2023 CLINICAL DATA:  Left upper quadrant pain. EXAM: CT ABDOMEN AND PELVIS WITH CONTRAST TECHNIQUE: Multidetector CT imaging of the abdomen and pelvis was performed using the standard protocol following bolus administration of intravenous contrast. RADIATION DOSE REDUCTION: This exam was performed according to the departmental dose-optimization program which includes automated exposure control, adjustment of the mA and/or kV according to patient size and/or use of iterative reconstruction technique. CONTRAST:  OMNIPAQUE IOHEXOL 350 MG/ML SOLN COMPARISON:  CTA chest, abdomen, and pelvis dated 05/22/2023. FINDINGS: Lower  chest: Please refer to the same-day CT chest for description of intrathoracic findings. Hepatobiliary: No focal liver abnormality is seen. No gallstones, gallbladder wall thickening, or biliary dilatation. Pancreas: Unremarkable. No pancreatic ductal dilatation or surrounding inflammatory changes. Spleen: Splenomegaly measuring up to 14.6 cm in craniocaudal dimension. No focal lesion. Adrenals/Urinary Tract: Adrenal glands are unremarkable. Kidneys are normal, without renal calculi, focal lesion, or hydronephrosis. Bladder is unremarkable. Stomach/Bowel: Stomach is within normal limits. Appendix appears normal. No evidence of bowel wall thickening, distention, or inflammatory changes. Sigmoid colonic diverticulosis without evidence of acute diverticulitis. Vascular/Lymphatic: Abdominal aorta is normal in caliber with mild aortic atherosclerosis. No enlarged abdominopelvic lymph nodes. Reproductive: Uterus and bilateral adnexa are unremarkable. Other: No abdominopelvic ascites.  No intraperitoneal free air. Partially visualized area of loculated fluid density along the right lateral chest wall (series 4, image 99), with surrounding surgical clips, likely reflects a postsurgical fluid collection related to interval bilateral mastectomies. This region was not included within the field of view on the same-day CT chest. Musculoskeletal: No acute osseous abnormality. No suspicious osseous lesion. IMPRESSION: 1. No acute localizing findings in the abdomen or pelvis. 2. Partially visualized fluid collection along the right lateral chest wall, with surrounding surgical clips, likely reflects a postsurgical fluid collection of indeterminate sterility related to interval bilateral mastectomies. This region was not included within the field of view on the same-day CT chest. 3. Sigmoid colonic diverticulosis without evidence of acute diverticulitis. 4. Splenomegaly. Electronically Signed   By: Hart Robinsons M.D.   On:  08/23/2023  14:47   CT Angio Chest PE W and/or Wo Contrast Result Date: 08/23/2023 CLINICAL DATA:  Pulmonary embolism (PE) suspected, high prob EXAM: CT ANGIOGRAPHY CHEST WITH CONTRAST TECHNIQUE: Multidetector CT imaging of the chest was performed using the standard protocol during bolus administration of intravenous contrast. Multiplanar CT image reconstructions and MIPs were obtained to evaluate the vascular anatomy. RADIATION DOSE REDUCTION: This exam was performed according to the departmental dose-optimization program which includes automated exposure control, adjustment of the mA and/or kV according to patient size and/or use of iterative reconstruction technique. CONTRAST:  OMNIPAQUE IOHEXOL 350 MG/ML SOLN COMPARISON:  CTA chest dated 05/22/2023. FINDINGS: Cardiovascular: Satisfactory opacification of the pulmonary arteries to the segmental level. No evidence of pulmonary embolism. Normal heart size. No pericardial effusion. Thoracic aorta is normal in caliber. Catheter tip again noted at the superior cavoatrial junction. Mediastinum/Nodes: No enlarged mediastinal, hilar, or axillary lymph nodes. Similar 12 mm right-sided thyroid nodule. Trachea and esophagus demonstrate no significant findings. Lungs/Pleura: Bilateral mosaic attenuation. No dense focal consolidation. Minimal bibasilar atelectasis. No pleural effusion or pneumothorax. No suspicious pulmonary nodule identified. Upper Abdomen: No acute abnormality. Musculoskeletal: Status post interval bilateral mastectomies. Large left chest wall wound extending along the mastectomy site. Regions of curvilinear air extending along the chest left chest wall from the level of the open wound likely contiguous with overlying skin flaps. This large wound/soft tissue defect measures approximately 12.8 cm transverse by 10.8 cm craniocaudal. No definite soft tissue gas is identified. There is surrounding associated stranding with overlying cutaneous thickening.  Ill-defined fluid extending along subcutaneous tissues of the right chest wall mastectomy site, overlying the right pectoralis major muscle. No sizable loculated collection identified. There is surrounding stranding and overlying cutaneous thickening. Scattered surgical clips. No acute osseous abnormality.  No suspicious osseous lesion. Review of the MIP images confirms the above findings. IMPRESSION: 1. No evidence of pulmonary embolism. 2. Status post interval bilateral mastectomies. Large left chest wall wound and soft tissue defect extending along the mastectomy site. No discrete fluid collection identified. Ill-defined fluid extending along subcutaneous tissues of the right chest wall mastectomy site, overlying the right pectoralis major muscle. No sizable loculated collection identified. Cellulitis can not be excluded. 3. Bilateral mosaic attenuation could reflect air trapping secondary to a small airways infectious/inflammatory etiology. 4. Similar right sided thyroid nodule. Consider further evaluation with nonemergent ultrasound. Electronically Signed   By: Hart Robinsons M.D.   On: 08/23/2023 14:37   DG Chest 2 View Result Date: 08/23/2023 CLINICAL DATA:  Sepsis.  Metastatic breast carcinoma. EXAM: CHEST - 2 VIEW COMPARISON:  07/09/2023 FINDINGS: The heart size and mediastinal contours are within normal limits. Right-sided Port-A-Cath remains in appropriate position. Both lungs are clear. The visualized skeletal structures are unremarkable. IMPRESSION: No active cardiopulmonary disease. Electronically Signed   By: Danae Orleans M.D.   On: 08/23/2023 11:25    Anti-infectives: Anti-infectives (From admission, onward)    Start     Dose/Rate Route Frequency Ordered Stop   08/24/23 1700  vancomycin (VANCOREADY) IVPB 1250 mg/250 mL        1,250 mg 166.7 mL/hr over 90 Minutes Intravenous Every 24 hours 08/23/23 1651     08/24/23 1100  vancomycin (VANCOREADY) IVPB 1250 mg/250 mL  Status:  Discontinued         1,250 mg 166.7 mL/hr over 90 Minutes Intravenous Every 24 hours 08/23/23 1608 08/23/23 1651   08/23/23 1800  ceFEPIme (MAXIPIME) 2 g in sodium chloride 0.9 % 100 mL  IVPB  Status:  Discontinued        2 g 200 mL/hr over 30 Minutes Intravenous Every 8 hours 08/23/23 1608 08/24/23 1119   08/23/23 1000  ceFEPIme (MAXIPIME) 2 g in sodium chloride 0.9 % 100 mL IVPB        2 g 200 mL/hr over 30 Minutes Intravenous  Once 08/23/23 0951 08/23/23 1054   08/23/23 1000  vancomycin (VANCOREADY) IVPB 2000 mg/400 mL        2,000 mg 200 mL/hr over 120 Minutes Intravenous  Once 08/23/23 0951 08/23/23 1418   08/23/23 0945  metroNIDAZOLE (FLAGYL) IVPB 500 mg  Status:  Discontinued        500 mg 100 mL/hr over 60 Minutes Intravenous Every 12 hours 08/23/23 0944 08/24/23 1119       Assessment/Plan: s/p  B mastectomy 1 month ago with cellulitis   -fever curve down, WBC now normal -continue antibiotics and wound care -ID following.  Blood cultures pending.  Given improvement, hopefully port can be saved.  Diamond Marshall 08/25/2023

## 2023-08-26 DIAGNOSIS — L0390 Cellulitis, unspecified: Secondary | ICD-10-CM | POA: Diagnosis not present

## 2023-08-26 DIAGNOSIS — A419 Sepsis, unspecified organism: Secondary | ICD-10-CM | POA: Diagnosis not present

## 2023-08-26 LAB — CULTURE, BLOOD (ROUTINE X 2): Special Requests: ADEQUATE

## 2023-08-26 MED ORDER — ESCITALOPRAM OXALATE 20 MG PO TABS: 20.0000 mg | ORAL_TABLET | Freq: Every day | ORAL | Status: DC

## 2023-08-26 MED ORDER — ORAL CARE MOUTH RINSE: 15.0000 mL | OROMUCOSAL | Status: DC | PRN

## 2023-08-26 MED ORDER — POTASSIUM CHLORIDE CRYS ER 20 MEQ PO TBCR
40.0000 meq | EXTENDED_RELEASE_TABLET | Freq: Once | ORAL | Status: AC
  Administered 2023-08-26: 40 meq via ORAL
  Filled 2023-08-26: qty 2

## 2023-08-26 MED ORDER — LINEZOLID 600 MG PO TABS: 600.0000 mg | ORAL_TABLET | Freq: Two times a day (BID) | ORAL | 0 refills | Status: AC

## 2023-08-26 MED ORDER — LINEZOLID 600 MG PO TABS
600.0000 mg | ORAL_TABLET | Freq: Two times a day (BID) | ORAL | Status: DC
  Administered 2023-08-26 – 2023-08-27 (×3): 600 mg via ORAL
  Filled 2023-08-26 (×3): qty 1

## 2023-08-26 MED ORDER — ESCITALOPRAM OXALATE 10 MG PO TABS
10.0000 mg | ORAL_TABLET | Freq: Every day | ORAL | Status: DC
  Administered 2023-08-27: 10 mg via ORAL
  Filled 2023-08-26: qty 1

## 2023-08-26 NOTE — Progress Notes (Signed)
   Subjective/Chief Complaint: She feels well today Has had no fever for over 24 hours   Objective: Vital signs in last 24 hours: Temp:  [98 F (36.7 C)-98.9 F (37.2 C)] 98 F (36.7 C) (04/06 0427) Pulse Rate:  [86-91] 87 (04/06 0427) Resp:  [18-20] 18 (04/06 0427) BP: (126-142)/(72-80) 140/72 (04/06 0427) SpO2:  [96 %-98 %] 96 % (04/06 0427) Last BM Date : 08/26/23  Intake/Output from previous day: No intake/output data recorded. Intake/Output this shift: No intake/output data recorded.  Exam: Awake and alert Cellulitis looks resolved Port site looks normal VAC in place  Lab Results:  Recent Labs    08/24/23 0504 08/25/23 0519  WBC 14.4* 8.1  HGB 9.4* 8.3*  HCT 31.1* 27.0*  PLT 229 230   BMET Recent Labs    08/24/23 0504 08/25/23 0519  NA 134* 136  K 3.5 3.4*  CL 104 106  CO2 19* 22  GLUCOSE 92 96  BUN 18 14  CREATININE 0.98 0.90  CALCIUM 8.2* 8.2*   PT/INR No results for input(s): "LABPROT", "INR" in the last 72 hours. ABG No results for input(s): "PHART", "HCO3" in the last 72 hours.  Invalid input(s): "PCO2", "PO2"  Studies/Results: No results found.  Anti-infectives: Anti-infectives (From admission, onward)    Start     Dose/Rate Route Frequency Ordered Stop   08/24/23 1700  vancomycin (VANCOREADY) IVPB 1250 mg/250 mL        1,250 mg 166.7 mL/hr over 90 Minutes Intravenous Every 24 hours 08/23/23 1651     08/24/23 1100  vancomycin (VANCOREADY) IVPB 1250 mg/250 mL  Status:  Discontinued        1,250 mg 166.7 mL/hr over 90 Minutes Intravenous Every 24 hours 08/23/23 1608 08/23/23 1651   08/23/23 1800  ceFEPIme (MAXIPIME) 2 g in sodium chloride 0.9 % 100 mL IVPB  Status:  Discontinued        2 g 200 mL/hr over 30 Minutes Intravenous Every 8 hours 08/23/23 1608 08/24/23 1119   08/23/23 1000  ceFEPIme (MAXIPIME) 2 g in sodium chloride 0.9 % 100 mL IVPB        2 g 200 mL/hr over 30 Minutes Intravenous  Once 08/23/23 0951 08/23/23 1054    08/23/23 1000  vancomycin (VANCOREADY) IVPB 2000 mg/400 mL        2,000 mg 200 mL/hr over 120 Minutes Intravenous  Once 08/23/23 0951 08/23/23 1418   08/23/23 0945  metroNIDAZOLE (FLAGYL) IVPB 500 mg  Status:  Discontinued        500 mg 100 mL/hr over 60 Minutes Intravenous Every 12 hours 08/23/23 0944 08/24/23 1119       Assessment/Plan: s/p B mastectomy 1 month ago with cellulitis   -most recent blood cultures negative -WBC normal -cellulitis resolved -no fevers last 24 hours  -from a general surgical standpoint, ok for discharge with the [facility] in place on the left -she already has a follow-up with him tomorrow in the office. -if still here tomorrow, we will see Abigail Miyamoto 08/26/2023

## 2023-08-26 NOTE — Progress Notes (Addendum)
 Wound vac switched over to home device. emptied from old wound vac. Wound vac supplies in room to be sent home with patient.

## 2023-08-26 NOTE — Discharge Summary (Signed)
 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  Patient was admitted for the management of sepsis secondary to cellulitis of bilateral mastectomy wounds.  General surgery, oncology following.  Wound culture showed MRSA.  ID consulted .  She has been afebrile for last 24 hours.  Hemodynamically stable.  Very eager to go home today.  After discussing with ID, will change antibiotics to oral linezolid.  She will follow-up with general surgery as an outpatient  Following problems were addressed during the hospitalization:  Sepsis secondary to cellulitis of the bilateral  breast: Presented with fever, leukocytosis.  Elevated lactate at 2 on presentation, blood pressure was soft. Left-sided breast wound culture showed  MRSA.one of the blood cultures showed staph epidermidis,likely contamination.  ID consulted.  Abx changed to oral   Status post bilateral mastectomy: History of inflammatory breast cancer.status post bilateral simple mastectomy with lymph node dissection on 3/6.  Has wounds on bilateral breasts.  General surgery, oncology following. Continue Wound VAC on the left side,follow up with surgery   Hypertension: On Coreg at home.  Continue   Depression: On Lexapro   GERD: Protonix   Morbid obesity: BMI 52  Discharge Diagnoses:  Principal Problem:   Sepsis due to cellulitis [facility])    Discharge Instructions  Discharge  Instructions     Diet general   Complete by: As directed    Discharge instructions   Complete by: As directed    1)Please take prescribed medications as instructed 2)Follow up with general surgery as an outpatient. 3)Please take citalopram 10 mg( half pill) only for next 3 days starting from tomorrow.  Then stop it until you finish the course of linezolid.  Can resume previous dose after  2 days of finishing linezolid   Discharge wound care:   Complete by: As directed    As per wound care nurse   Increase activity slowly   Complete by: As directed       Allergies as of 08/27/2023       Reactions   Phentermine Other (See Comments)   Became suicidal on the medication   Bee Venom Swelling, Other (See Comments)   Severe swelling where stung   Lactose Intolerance (gi) Diarrhea   Peanut Allergen Powder-dnfp Other (See Comments)   Migraines   Topiramate Other (See Comments)   Dizziness   Sulfa Antibiotics Itching, Rash   Tape Rash   Wound Dressing Adhesive Rash        Medication List     STOP taking these medications    doxycycline 100 MG capsule Commonly known as: VIBRAMYCIN       TAKE these medications    acetaminophen 325 MG tablet Commonly known as: TYLENOL Take 2 tablets (650 mg total) by mouth every 6 (six) hours as needed for mild pain (pain score 1-3) (or Fever >/= 101).   budesonide 3 MG 24 hr capsule Commonly known as: ENTOCORT EC TAKE THREE CAPSULES BY MOUTH DAILY   carvedilol 3.125 MG tablet Commonly known as: COREG TAKE ONE TABLET TWICE DAILY WITH  MEAL(S)   colestipol 1 g tablet Commonly known as: COLESTID Take 2 tablets (2 g total) by mouth 2 (two) times daily.   cyanocobalamin 1000 MCG/ML injection Commonly known as: VITAMIN B12 INJECT 1 ML INTRAMUSCULARLY EVERY 15 DAYS What changed:  how much to take how to take this when to take this additional instructions   cycloSPORINE 0.05 % ophthalmic emulsion Commonly known as: RESTASIS Place 1  drop into both eyes 2 (two) times daily.   diphenoxylate-atropine 2.5-0.025 MG tablet Commonly known as: LOMOTIL TAKE TWO TABLETS EVERY 6 HOURS AS NEEDED FOR DIARRHEA   EPINEPHrine 0.3 mg/0.3 mL Soaj injection Commonly known as: EPI-PEN Inject 0.3 mg into the muscle as needed for anaphylaxis.   escitalopram 20 MG tablet Commonly known as: LEXAPRO Take 1 tablet (20 mg total) by mouth daily. Please take citalopram 10 mg( half pill) only for next 3 days starting from tomorrow.  Then stop it until you finish the course of linezolid.  Can resume previous dose after  2 days of finishing linezolid What changed: additional instructions   hydrocortisone 2.5 % rectal cream Commonly known as: ANUSOL-HC Place 1 Application rectally 2 (two) times daily.   linezolid 600 MG tablet Commonly known as: ZYVOX Take 1 tablet (600 mg total) by mouth every 12 (twelve) hours for 14 days.   LORazepam 0.5 MG tablet Commonly known as: ATIVAN Take 1 tablet (0.5 mg total) by mouth every 8 (eight) hours as needed for anxiety.   methocarbamol 500 MG tablet Commonly known as: ROBAXIN Take 1 tablet (500 mg total) by mouth every 6 (six) hours as needed for muscle spasms.   oxyCODONE 5 MG immediate release tablet Commonly known as: Oxy IR/ROXICODONE Take 1 tablet (5 mg total) by mouth every 6 (six) hours as needed for severe pain (pain score 7-10).   pantoprazole 40 MG tablet Commonly known as: PROTONIX Take 1 tablet (40 mg total) by mouth daily. What changed:  when to take this additional instructions   potassium chloride 10 MEQ tablet Commonly known as: KLOR-CON TAKE TWO TABLETS DAILY AS DIRECTED What changed: See the new instructions.               Durable Medical Equipment  (From admission, onward)           Start     Ordered   08/24/23 0851  For home use only DME Negative pressure wound device  Once       Question Answer Comment  Frequency of dressing change 3 times per week    Length of need 6 Months   Dressing type Foam   Amount of suction 125 mm/Hg   Pressure application Continuous pressure   Supplies 10 canisters and 15 dressings per month for duration of therapy      08/24/23 0853              Discharge Care Instructions  (From admission, onward)           Start     Ordered   08/26/23 0000  Discharge wound care:       Comments: As per wound care nurse   08/26/23 1148            Follow-up Information     Stacks, Broadus John, MD. Schedule an appointment as soon as possible for a visit in 1 week(s).   Specialty: Family Medicine Contact information: [address] [place] [address] [phone]  Allergies  Allergen Reactions   Phentermine Other (See Comments)    Became suicidal on the medication   Bee Venom Swelling and Other (See Comments)    Severe swelling where stung   Lactose Intolerance (Gi) Diarrhea   Peanut Allergen Powder-Dnfp Other (See Comments)    Migraines   Topiramate Other (See Comments)    Dizziness    Sulfa Antibiotics Itching and Rash   Tape Rash   Wound Dressing Adhesive Rash    Consultations:    Procedures/Studies: CT ABDOMEN PELVIS W CONTRAST Result Date: 08/23/2023 CLINICAL DATA:  Left upper quadrant pain. EXAM: CT ABDOMEN AND PELVIS WITH CONTRAST TECHNIQUE: Multidetector CT imaging of the abdomen and pelvis was performed using the standard protocol following bolus administration of intravenous contrast. RADIATION DOSE REDUCTION: This exam was performed according to the departmental dose-optimization program which includes automated exposure control, adjustment of the mA and/or kV according to patient size and/or use of iterative reconstruction technique. CONTRAST:  OMNIPAQUE IOHEXOL 350 MG/ML SOLN COMPARISON:  CTA chest, abdomen, and pelvis dated 05/22/2023. FINDINGS: Lower chest: Please refer to the same-day CT chest for description of intrathoracic findings. Hepatobiliary: No  focal liver abnormality is seen. No gallstones, gallbladder wall thickening, or biliary dilatation. Pancreas: Unremarkable. No pancreatic ductal dilatation or surrounding inflammatory changes. Spleen: Splenomegaly measuring up to 14.6 cm in craniocaudal dimension. No focal lesion. Adrenals/Urinary Tract: Adrenal glands are unremarkable. Kidneys are normal, without renal calculi, focal lesion, or hydronephrosis. Bladder is unremarkable. Stomach/Bowel: Stomach is within normal limits. Appendix appears normal. No evidence of bowel wall thickening, distention, or inflammatory changes. Sigmoid colonic diverticulosis without evidence of acute diverticulitis. Vascular/Lymphatic: Abdominal aorta is normal in caliber with mild aortic atherosclerosis. No enlarged abdominopelvic lymph nodes. Reproductive: Uterus and bilateral adnexa are unremarkable. Other: No abdominopelvic ascites.  No intraperitoneal free air. Partially visualized area of loculated fluid density along the right lateral chest wall (series 4, image 99), with surrounding surgical clips, likely reflects a postsurgical fluid collection related to interval bilateral mastectomies. This region was not included within the field of view on the same-day CT chest. Musculoskeletal: No acute osseous abnormality. No suspicious osseous lesion. IMPRESSION: 1. No acute localizing findings in the abdomen or pelvis. 2. Partially visualized fluid collection along the right lateral chest wall, with surrounding surgical clips, likely reflects a postsurgical fluid collection of indeterminate sterility related to interval bilateral mastectomies. This region was not included within the field of view on the same-day CT chest. 3. Sigmoid colonic diverticulosis without evidence of acute diverticulitis. 4. Splenomegaly. Electronically Signed   By: Hart Robinsons M.D.   On: 08/23/2023 14:47   CT Angio Chest PE W and/or Wo Contrast Result Date: 08/23/2023 CLINICAL DATA:  Pulmonary  embolism (PE) suspected, high prob EXAM: CT ANGIOGRAPHY CHEST WITH CONTRAST TECHNIQUE: Multidetector CT imaging of the chest was performed using the standard protocol during bolus administration of intravenous contrast. Multiplanar CT image reconstructions and MIPs were obtained to evaluate the vascular anatomy. RADIATION DOSE REDUCTION: This exam was performed according to the departmental dose-optimization program which includes automated exposure control, adjustment of the mA and/or kV according to patient size and/or use of iterative reconstruction technique. CONTRAST:  OMNIPAQUE IOHEXOL 350 MG/ML SOLN COMPARISON:  CTA chest dated 05/22/2023. FINDINGS: Cardiovascular: Satisfactory opacification of the pulmonary arteries to the segmental level. No evidence of pulmonary embolism. Normal heart size. No pericardial effusion. Thoracic aorta is normal in caliber. Catheter tip again noted at the superior cavoatrial junction. Mediastinum/Nodes: No enlarged  mediastinal, hilar, or axillary lymph nodes. Similar 12 mm right-sided thyroid nodule. Trachea and esophagus demonstrate no significant findings. Lungs/Pleura: Bilateral mosaic attenuation. No dense focal consolidation. Minimal bibasilar atelectasis. No pleural effusion or pneumothorax. No suspicious pulmonary nodule identified. Upper Abdomen: No acute abnormality. Musculoskeletal: Status post interval bilateral mastectomies. Large left chest wall wound extending along the mastectomy site. Regions of curvilinear air extending along the chest left chest wall from the level of the open wound likely contiguous with overlying skin flaps. This large wound/soft tissue defect measures approximately 12.8 cm transverse by 10.8 cm craniocaudal. No definite soft tissue gas is identified. There is surrounding associated stranding with overlying cutaneous thickening. Ill-defined fluid extending along subcutaneous tissues of the right chest wall mastectomy site, overlying the  right pectoralis major muscle. No sizable loculated collection identified. There is surrounding stranding and overlying cutaneous thickening. Scattered surgical clips. No acute osseous abnormality.  No suspicious osseous lesion. Review of the MIP images confirms the above findings. IMPRESSION: 1. No evidence of pulmonary embolism. 2. Status post interval bilateral mastectomies. Large left chest wall wound and soft tissue defect extending along the mastectomy site. No discrete fluid collection identified. Ill-defined fluid extending along subcutaneous tissues of the right chest wall mastectomy site, overlying the right pectoralis major muscle. No sizable loculated collection identified. Cellulitis can not be excluded. 3. Bilateral mosaic attenuation could reflect air trapping secondary to a small airways infectious/inflammatory etiology. 4. Similar right sided thyroid nodule. Consider further evaluation with nonemergent ultrasound. Electronically Signed   By: Hart Robinsons M.D.   On: 08/23/2023 14:37   DG Chest 2 View Result Date: 08/23/2023 CLINICAL DATA:  Sepsis.  Metastatic breast carcinoma. EXAM: CHEST - 2 VIEW COMPARISON:  07/09/2023 FINDINGS: The heart size and mediastinal contours are within normal limits. Right-sided Port-A-Cath remains in appropriate position. Both lungs are clear. The visualized skeletal structures are unremarkable. IMPRESSION: No active cardiopulmonary disease. Electronically Signed   By: Danae Orleans M.D.   On: 08/23/2023 11:25      Subjective: Patient seen and examined at bedside today.  Hemodynamically stable.  Afebrile this morning.  Very eager to go home.  Denies any new complaints.  Medically stable for discharge.  Discharge planning discussed with husband at bedside  Discharge Exam: Vitals:   08/26/23 1936 08/27/23 0519  BP: (!) 147/89 128/65  Pulse: 94 90  Resp: 18 20  Temp: 98.5 F (36.9 C) 98.3 F (36.8 C)  SpO2: 100% 96%   Vitals:   08/26/23 0427  08/26/23 1254 08/26/23 1936 08/27/23 0519  BP: (!) 140/72 (!) 140/80 (!) 147/89 128/65  Pulse: 87 88 94 90  Resp: 18 18 18 20   Temp: 98 F (36.7 C) 98 F (36.7 C) 98.5 F (36.9 C) 98.3 F (36.8 C)  TempSrc: Oral Oral Oral Oral  SpO2: 96% 97% 100% 96%  Weight:      Height:        General: Pt is alert, awake, not in acute distress,morbidly obese Cardiovascular: RRR, S1/S2 +, no rubs, no gallops Chest: Bilateral mastectomy, right breast covered with dressing, left breast has wound VAC Respiratory: CTA bilaterally, no wheezing, no rhonchi Abdominal: Soft, NT, ND, bowel sounds + Extremities: no edema, no cyanosis    The results of significant diagnostics from this hospitalization (including imaging, microbiology, ancillary and laboratory) are listed below for reference.     Microbiology: Recent Results (from the past 240 hours)  Culture, blood (Routine x 2)     Status: Abnormal  Collection Time: 08/23/23  9:55 AM   Specimen: BLOOD LEFT HAND  Result Value Ref Range Status   Specimen Description   Final    BLOOD LEFT HAND Performed at [facility], [address], [place], [place] [address]    Special Requests   Final    BOTTLES DRAWN AEROBIC AND ANAEROBIC Blood Culture adequate volume Performed at [facility], [address], [place], [place] [address]    Culture  Setup Time   Final    GRAM POSITIVE COCCI IN CLUSTERS ANAEROBIC BOTTLE ONLY CRITICAL RESULT CALLED TO, READ BACK BY AND VERIFIED WITH: PHARMD J.LEGGE AT 1145 ON 08/24/2023 BY T.SAAD.    Culture (A)  Final    STAPHYLOCOCCUS EPIDERMIDIS THE SIGNIFICANCE OF ISOLATING THIS ORGANISM FROM A SINGLE SET OF BLOOD CULTURES WHEN MULTIPLE SETS ARE DRAWN IS UNCERTAIN. PLEASE NOTIFY THE MICROBIOLOGY DEPARTMENT WITHIN ONE WEEK IF SPECIATION AND SENSITIVITIES ARE REQUIRED. Performed at [facility], [address] [place], [place] [address]    Report Status 08/26/2023 FINAL  Final  Blood  Culture ID Panel (Reflexed)     Status: Abnormal   Collection Time: 08/23/23  9:55 AM  Result Value Ref Range Status   Enterococcus faecalis NOT DETECTED NOT DETECTED Final   Enterococcus Faecium NOT DETECTED NOT DETECTED Final   Listeria monocytogenes NOT DETECTED NOT DETECTED Final   Staphylococcus species DETECTED (A) NOT DETECTED Final    Comment: CRITICAL RESULT CALLED TO, READ BACK BY AND VERIFIED WITH: PHARMD J.LEGGE AT 1145 ON 08/24/2023 BY T.SAAD.    Staphylococcus aureus (BCID) NOT DETECTED NOT DETECTED Final   Staphylococcus epidermidis DETECTED (A) NOT DETECTED Final    Comment: Methicillin (oxacillin) resistant coagulase negative staphylococcus. Possible blood culture contaminant (unless isolated from more than one blood culture draw or clinical case suggests pathogenicity). No antibiotic treatment is indicated for blood  culture contaminants. CRITICAL RESULT CALLED TO, READ BACK BY AND VERIFIED WITH: PHARMD J.LEGGE AT 1145 ON 08/24/2023 BY T.SAAD.    Staphylococcus lugdunensis NOT DETECTED NOT DETECTED Final   Streptococcus species NOT DETECTED NOT DETECTED Final   Streptococcus agalactiae NOT DETECTED NOT DETECTED Final   Streptococcus pneumoniae NOT DETECTED NOT DETECTED Final   Streptococcus pyogenes NOT DETECTED NOT DETECTED Final   A.calcoaceticus-baumannii NOT DETECTED NOT DETECTED Final   Bacteroides fragilis NOT DETECTED NOT DETECTED Final   Enterobacterales NOT DETECTED NOT DETECTED Final   Enterobacter cloacae complex NOT DETECTED NOT DETECTED Final   Escherichia coli NOT DETECTED NOT DETECTED Final   Klebsiella aerogenes NOT DETECTED NOT DETECTED Final   Klebsiella oxytoca NOT DETECTED NOT DETECTED Final   Klebsiella pneumoniae NOT DETECTED NOT DETECTED Final   Proteus species NOT DETECTED NOT DETECTED Final   Salmonella species NOT DETECTED NOT DETECTED Final   Serratia marcescens NOT DETECTED NOT DETECTED Final   Haemophilus influenzae NOT DETECTED NOT  DETECTED Final   Neisseria meningitidis NOT DETECTED NOT DETECTED Final   Pseudomonas aeruginosa NOT DETECTED NOT DETECTED Final   Stenotrophomonas maltophilia NOT DETECTED NOT DETECTED Final   Candida albicans NOT DETECTED NOT DETECTED Final   Candida auris NOT DETECTED NOT DETECTED Final   Candida glabrata NOT DETECTED NOT DETECTED Final   Candida krusei NOT DETECTED NOT DETECTED Final   Candida parapsilosis NOT DETECTED NOT DETECTED Final   Candida tropicalis NOT DETECTED NOT DETECTED Final   Cryptococcus neoformans/gattii NOT DETECTED NOT DETECTED Final   Methicillin resistance mecA/C DETECTED (A) NOT DETECTED Final  Comment: CRITICAL RESULT CALLED TO, READ BACK BY AND VERIFIED WITH: PHARMD J.LEGGE AT 1145 ON 08/24/2023 BY T.SAAD. Performed at [facility], [address], [place], [place] [address]   Culture, blood (Routine x 2)     Status: None (Preliminary result)   Collection Time: 08/23/23 10:15 AM   Specimen: BLOOD  Result Value Ref Range Status   Specimen Description   Final    BLOOD RIGHT ANTECUBITAL Performed at [facility], [address], [place], [place] [address]    Special Requests   Final    BOTTLES DRAWN AEROBIC AND ANAEROBIC Blood Culture adequate volume Performed at [facility], [address], [place], [place] [address]    Culture   Final    NO GROWTH 4 DAYS Performed at [facility], [address], [place], [place] [address]    Report Status PENDING  Incomplete  Aerobic Culture w Gram Stain (superficial specimen)     Status: None   Collection Time: 08/23/23 10:55 AM   Specimen: Wound  Result Value Ref Range Status   Specimen Description   Final    WOUND LEFT BREAST Performed at [facility], [address] [place], [place] [address]    Special Requests   Final    NONE Performed at [facility], [address], [place], [place] [address]    Gram Stain    Final    MODERATE WBC PRESENT, PREDOMINANTLY PMN FEW GRAM POSITIVE COCCI IN PAIRS Performed at [facility], [address], [place], [place] [address]    Culture FEW METHICILLIN RESISTANT STAPHYLOCOCCUS AUREUS  Final   Report Status 08/25/2023 FINAL  Final   Organism ID, Bacteria METHICILLIN RESISTANT STAPHYLOCOCCUS AUREUS  Final      Susceptibility   Methicillin resistant staphylococcus aureus - MIC*    CIPROFLOXACIN 4 RESISTANT Resistant     ERYTHROMYCIN >=8 RESISTANT Resistant     GENTAMICIN <=0.5 SENSITIVE Sensitive     OXACILLIN >=4 RESISTANT Resistant     TETRACYCLINE >=16 RESISTANT Resistant     VANCOMYCIN 1 SENSITIVE Sensitive     TRIMETH/SULFA <=10 SENSITIVE Sensitive     CLINDAMYCIN >=8 RESISTANT Resistant     RIFAMPIN <=0.5 SENSITIVE Sensitive     Inducible Clindamycin NEGATIVE Sensitive     LINEZOLID 2 SENSITIVE Sensitive     * FEW METHICILLIN RESISTANT STAPHYLOCOCCUS AUREUS  Resp panel by RT-PCR (RSV, Flu A&B, Covid) Anterior Nasal Swab     Status: None   Collection Time: 08/23/23 10:55 AM   Specimen: Anterior Nasal Swab  Result Value Ref Range Status   SARS Coronavirus 2 by RT PCR NEGATIVE NEGATIVE Final    Comment: (NOTE) SARS-CoV-2 target nucleic acids are NOT DETECTED.  The SARS-CoV-2 RNA is generally detectable in upper respiratory specimens during the acute phase of infection. The lowest concentration of SARS-CoV-2 viral copies this assay can detect is 138 copies/mL. A negative result does not preclude SARS-Cov-2 infection and should not be used as the sole basis for treatment or other patient management decisions. A negative result may occur with  improper specimen collection/handling, submission of specimen other than nasopharyngeal swab, presence of viral mutation(s) within the areas targeted by this assay, and inadequate number of viral copies(<138 copies/mL). A negative result must be combined with clinical observations, patient history, and  epidemiological information. The expected result is Negative.  Fact Sheet for Patients:  BloggerCourse.com  Fact Sheet for Healthcare Providers:  SeriousBroker.it  This test is no t yet approved or cleared by the [place] and  has been authorized for detection and/or diagnosis of SARS-CoV-2 by FDA under an Emergency Use Authorization (EUA). This EUA will remain  in effect (meaning this test can be used) for the duration of the COVID-19 declaration under Section 564(b)(1) of the Act, 21 U.S.C.section 360bbb-3(b)(1), unless the authorization is terminated  or revoked sooner.       Influenza A by PCR NEGATIVE NEGATIVE Final   Influenza B by PCR NEGATIVE NEGATIVE Final    Comment: (NOTE) The Xpert Xpress SARS-CoV-2/FLU/RSV plus assay is intended as an aid in the diagnosis of influenza from Nasopharyngeal swab specimens and should not be used as a sole basis for treatment. Nasal washings and aspirates are unacceptable for Xpert Xpress SARS-CoV-2/FLU/RSV testing.  Fact Sheet for Patients: BloggerCourse.com  Fact Sheet for Healthcare Providers: SeriousBroker.it  This test is not yet approved or cleared by the [place] FDA and has been authorized for detection and/or diagnosis of SARS-CoV-2 by FDA under an Emergency Use Authorization (EUA). This EUA will remain in effect (meaning this test can be used) for the duration of the COVID-19 declaration under Section 564(b)(1) of the Act, 21 U.S.C. section 360bbb-3(b)(1), unless the authorization is terminated or revoked.     Resp Syncytial Virus by PCR NEGATIVE NEGATIVE Final    Comment: (NOTE) Fact Sheet for Patients: BloggerCourse.com  Fact Sheet for Healthcare Providers: SeriousBroker.it  This test is not yet approved or cleared by the [place] FDA and has been  authorized for detection and/or diagnosis of SARS-CoV-2 by FDA under an Emergency Use Authorization (EUA). This EUA will remain in effect (meaning this test can be used) for the duration of the COVID-19 declaration under Section 564(b)(1) of the Act, 21 U.S.C. section 360bbb-3(b)(1), unless the authorization is terminated or revoked.  Performed at [facility], [address] [place], [place] [address]   Aerobic Culture w Gram Stain (superficial specimen)     Status: None (Preliminary result)   Collection Time: 08/24/23  2:48 PM   Specimen: Breast  Result Value Ref Range Status   Specimen Description   Final    BREAST Performed at [facility], [address], [place], [place] [address]    Special Requests   Final    RIGHT Performed at [facility], [address], [place], [place] [address]    Gram Stain   Final    ABUNDANT WBC PRESENT, PREDOMINANTLY PMN RARE GRAM POSITIVE COCCI    Culture   Final    FEW STAPHYLOCOCCUS AUREUS SUSCEPTIBILITIES TO FOLLOW Performed at [facility], [address], [place], [place] [address]    Report Status PENDING  Incomplete  Culture, blood (Routine X 2) w Reflex to ID Panel     Status: None (Preliminary result)   Collection Time: 08/25/23  5:19 AM   Specimen: BLOOD  Result Value Ref Range Status   Specimen Description   Final    BLOOD BLOOD RIGHT HAND Performed at [facility], [address] [place], [place] [address]    Special Requests   Final    BOTTLES DRAWN AEROBIC ONLY Blood Culture results may not be optimal due to an inadequate volume of blood received in culture bottles Performed at [facility], [address] [place], [place] [address]    Culture   Final    NO GROWTH 2 DAYS  Performed at [facility], [address], [place], [place] [address]    Report Status PENDING  Incomplete  Culture, blood (Routine X 2) w  Reflex to ID Panel     Status: None (Preliminary result)   Collection Time: 08/25/23  5:19 AM   Specimen: BLOOD  Result Value Ref Range Status   Specimen Description   Final    BLOOD SITE NOT SPECIFIED Performed at [facility], [address], [place], [place] [address]    Special Requests   Final    BOTTLES DRAWN AEROBIC ONLY Blood Culture results may not be optimal due to an inadequate volume of blood received in culture bottles Performed at [facility], [address] [place], [place] [address]    Culture   Final    NO GROWTH 2 DAYS Performed at [facility], [address] [place], [place] [address]    Report Status PENDING  Incomplete     Labs: BNP (last 3 results) Recent Labs    06/01/23 1437  BNP 32.1   Basic Metabolic Panel: Recent Labs  Lab 08/23/23 0940 08/24/23 0504 08/25/23 0519  NA 135 134* 136  K 3.5 3.5 3.4*  CL 101 104 106  CO2 21* 19* 22  GLUCOSE 115* 92 96  BUN 12 18 14   CREATININE 1.07* 0.98 0.90  CALCIUM 8.5* 8.2* 8.2*   Liver Function Tests: Recent Labs  Lab 08/23/23 0940  AST 27  ALT 11  ALKPHOS 65  BILITOT 0.8  PROT 7.3  ALBUMIN 2.8*   No results for input(s): "LIPASE", "AMYLASE" in the last 168 hours. No results for input(s): "AMMONIA" in the last 168 hours. CBC: Recent Labs  Lab 08/23/23 0940 08/24/23 0504 08/25/23 0519  WBC 18.1* 14.4* 8.1  NEUTROABS 16.1*  --   --   HGB 9.7* 9.4* 8.3*  HCT 31.3* 31.1* 27.0*  MCV 98.1 99.7 97.8  PLT 286 229 230   Cardiac Enzymes: No results for input(s): "CKTOTAL", "CKMB", "CKMBINDEX", "TROPONINI" in the last 168 hours. BNP: Invalid input(s): "POCBNP" CBG: No results for input(s): "GLUCAP" in the last 168 hours. D-Dimer No results for input(s): "DDIMER" in the last 72 hours. Hgb A1c No results for input(s): "HGBA1C" in the last 72 hours. Lipid Profile No results for input(s): "CHOL", "HDL", "LDLCALC", "TRIG", "CHOLHDL", "LDLDIRECT"  in the last 72 hours. Thyroid function studies No results for input(s): "TSH", "T4TOTAL", "T3FREE", "THYROIDAB" in the last 72 hours.  Invalid input(s): "FREET3" Anemia work up No results for input(s): "VITAMINB12", "FOLATE", "FERRITIN", "TIBC", "IRON", "RETICCTPCT" in the last 72 hours. Urinalysis    Component Value Date/Time   COLORURINE YELLOW 02/06/2023 2314   APPEARANCEUR CLEAR 02/06/2023 2314   APPEARANCEUR Clear 07/30/2020 0954   LABSPEC 1.018 02/06/2023 2314   PHURINE 5.0 02/06/2023 2314   GLUCOSEU NEGATIVE 02/06/2023 2314   HGBUR NEGATIVE 02/06/2023 2314   BILIRUBINUR NEGATIVE 02/06/2023 2314   BILIRUBINUR Negative 07/30/2020 0954   KETONESUR NEGATIVE 02/06/2023 2314   PROTEINUR NEGATIVE 02/06/2023 2314   NITRITE NEGATIVE 02/06/2023 2314   LEUKOCYTESUR MODERATE (A) 02/06/2023 2314   Sepsis Labs Recent Labs  Lab 08/23/23 0940 08/24/23 0504 08/25/23 0519  WBC 18.1* 14.4* 8.1   Microbiology Recent Results (from the past 240 hours)  Culture, blood (Routine x 2)     Status: Abnormal   Collection Time: 08/23/23  9:55 AM   Specimen: BLOOD LEFT HAND  Result Value Ref Range Status   Specimen Description  Final    BLOOD LEFT HAND Performed at [facility], [address], [place], [place] [address]    Special Requests   Final    BOTTLES DRAWN AEROBIC AND ANAEROBIC Blood Culture adequate volume Performed at [facility], [address], [place], [place] [address]    Culture  Setup Time   Final    GRAM POSITIVE COCCI IN CLUSTERS ANAEROBIC BOTTLE ONLY CRITICAL RESULT CALLED TO, READ BACK BY AND VERIFIED WITH: PHARMD J.LEGGE AT 1145 ON 08/24/2023 BY T.SAAD.    Culture (A)  Final    STAPHYLOCOCCUS EPIDERMIDIS THE SIGNIFICANCE OF ISOLATING THIS ORGANISM FROM A SINGLE SET OF BLOOD CULTURES WHEN MULTIPLE SETS ARE DRAWN IS UNCERTAIN. PLEASE NOTIFY THE MICROBIOLOGY DEPARTMENT WITHIN ONE WEEK IF SPECIATION AND SENSITIVITIES ARE REQUIRED. Performed  at [facility], [address] [place], [place] [address]    Report Status 08/26/2023 FINAL  Final  Blood Culture ID Panel (Reflexed)     Status: Abnormal   Collection Time: 08/23/23  9:55 AM  Result Value Ref Range Status   Enterococcus faecalis NOT DETECTED NOT DETECTED Final   Enterococcus Faecium NOT DETECTED NOT DETECTED Final   Listeria monocytogenes NOT DETECTED NOT DETECTED Final   Staphylococcus species DETECTED (A) NOT DETECTED Final    Comment: CRITICAL RESULT CALLED TO, READ BACK BY AND VERIFIED WITH: PHARMD J.LEGGE AT 1145 ON 08/24/2023 BY T.SAAD.    Staphylococcus aureus (BCID) NOT DETECTED NOT DETECTED Final   Staphylococcus epidermidis DETECTED (A) NOT DETECTED Final    Comment: Methicillin (oxacillin) resistant coagulase negative staphylococcus. Possible blood culture contaminant (unless isolated from more than one blood culture draw or clinical case suggests pathogenicity). No antibiotic treatment is indicated for blood  culture contaminants. CRITICAL RESULT CALLED TO, READ BACK BY AND VERIFIED WITH: PHARMD J.LEGGE AT 1145 ON 08/24/2023 BY T.SAAD.    Staphylococcus lugdunensis NOT DETECTED NOT DETECTED Final   Streptococcus species NOT DETECTED NOT DETECTED Final   Streptococcus agalactiae NOT DETECTED NOT DETECTED Final   Streptococcus pneumoniae NOT DETECTED NOT DETECTED Final   Streptococcus pyogenes NOT DETECTED NOT DETECTED Final   A.calcoaceticus-baumannii NOT DETECTED NOT DETECTED Final   Bacteroides fragilis NOT DETECTED NOT DETECTED Final   Enterobacterales NOT DETECTED NOT DETECTED Final   Enterobacter cloacae complex NOT DETECTED NOT DETECTED Final   Escherichia coli NOT DETECTED NOT DETECTED Final   Klebsiella aerogenes NOT DETECTED NOT DETECTED Final   Klebsiella oxytoca NOT DETECTED NOT DETECTED Final   Klebsiella pneumoniae NOT DETECTED NOT DETECTED Final   Proteus species NOT DETECTED NOT DETECTED Final   Salmonella species NOT DETECTED NOT  DETECTED Final   Serratia marcescens NOT DETECTED NOT DETECTED Final   Haemophilus influenzae NOT DETECTED NOT DETECTED Final   Neisseria meningitidis NOT DETECTED NOT DETECTED Final   Pseudomonas aeruginosa NOT DETECTED NOT DETECTED Final   Stenotrophomonas maltophilia NOT DETECTED NOT DETECTED Final   Candida albicans NOT DETECTED NOT DETECTED Final   Candida auris NOT DETECTED NOT DETECTED Final   Candida glabrata NOT DETECTED NOT DETECTED Final   Candida krusei NOT DETECTED NOT DETECTED Final   Candida parapsilosis NOT DETECTED NOT DETECTED Final   Candida tropicalis NOT DETECTED NOT DETECTED Final   Cryptococcus neoformans/gattii NOT DETECTED NOT DETECTED Final   Methicillin resistance mecA/C DETECTED (A) NOT DETECTED Final    Comment: CRITICAL RESULT CALLED TO, READ BACK BY AND VERIFIED WITH: PHARMD J.LEGGE AT 1145 ON 08/24/2023 BY T.SAAD. Performed at [facility], [address] [place], [place] [address]   Culture, blood (Routine x 2)     Status: None (Preliminary result)   Collection Time: 08/23/23 10:15 AM   Specimen: BLOOD  Result Value Ref Range Status   Specimen Description   Final    BLOOD RIGHT ANTECUBITAL Performed at [facility], [address], [place], [place] [address]    Special Requests   Final    BOTTLES DRAWN AEROBIC AND ANAEROBIC Blood Culture adequate volume Performed at [facility], [address] [place], [place] [address]    Culture   Final    NO GROWTH 4 DAYS Performed at [facility], [address], [place], [place] [address]    Report Status PENDING  Incomplete  Aerobic Culture w Gram Stain (superficial specimen)     Status: None   Collection Time: 08/23/23 10:55 AM   Specimen: Wound  Result Value Ref Range Status   Specimen Description   Final    WOUND LEFT BREAST Performed at [facility], [address], [place], [place] [address]    Special Requests   Final     NONE Performed at [facility], [address], [place], [place] [address]    Gram Stain   Final    MODERATE WBC PRESENT, PREDOMINANTLY PMN FEW GRAM POSITIVE COCCI IN PAIRS Performed at [facility], [address] [place], [place] [address]    Culture FEW METHICILLIN RESISTANT STAPHYLOCOCCUS AUREUS  Final   Report Status 08/25/2023 FINAL  Final   Organism ID, Bacteria METHICILLIN RESISTANT STAPHYLOCOCCUS AUREUS  Final      Susceptibility   Methicillin resistant staphylococcus aureus - MIC*    CIPROFLOXACIN 4 RESISTANT Resistant     ERYTHROMYCIN >=8 RESISTANT Resistant     GENTAMICIN <=0.5 SENSITIVE Sensitive     OXACILLIN >=4 RESISTANT Resistant     TETRACYCLINE >=16 RESISTANT Resistant     VANCOMYCIN 1 SENSITIVE Sensitive     TRIMETH/SULFA <=10 SENSITIVE Sensitive     CLINDAMYCIN >=8 RESISTANT Resistant     RIFAMPIN <=0.5 SENSITIVE Sensitive     Inducible Clindamycin NEGATIVE Sensitive     LINEZOLID 2 SENSITIVE Sensitive     * FEW METHICILLIN RESISTANT STAPHYLOCOCCUS AUREUS  Resp panel by RT-PCR (RSV, Flu A&B, Covid) Anterior Nasal Swab     Status: None   Collection Time: 08/23/23 10:55 AM   Specimen: Anterior Nasal Swab  Result Value Ref Range Status   SARS Coronavirus 2 by RT PCR NEGATIVE NEGATIVE Final    Comment: (NOTE) SARS-CoV-2 target nucleic acids are NOT DETECTED.  The SARS-CoV-2 RNA is generally detectable in upper respiratory specimens during the acute phase of infection. The lowest concentration of SARS-CoV-2 viral copies this assay can detect is 138 copies/mL. A negative result does not preclude SARS-Cov-2 infection and should not be used as the sole basis for treatment or other patient management decisions. A negative result may occur with  improper specimen collection/handling, submission of specimen other than nasopharyngeal swab, presence of viral mutation(s) within the areas targeted by this assay, and inadequate number of  viral copies(<138 copies/mL). A negative result must be combined with clinical observations, patient history, and epidemiological information. The expected result is Negative.  Fact Sheet for Patients:  BloggerCourse.com  Fact Sheet for Healthcare Providers:  SeriousBroker.it  This test is no t yet approved or cleared by the [place] and  has been authorized  for detection and/or diagnosis of SARS-CoV-2 by FDA under an Emergency Use Authorization (EUA). This EUA will remain  in effect (meaning this test can be used) for the duration of the COVID-19 declaration under Section 564(b)(1) of the Act, 21 U.S.C.section 360bbb-3(b)(1), unless the authorization is terminated  or revoked sooner.       Influenza A by PCR NEGATIVE NEGATIVE Final   Influenza B by PCR NEGATIVE NEGATIVE Final    Comment: (NOTE) The Xpert Xpress SARS-CoV-2/FLU/RSV plus assay is intended as an aid in the diagnosis of influenza from Nasopharyngeal swab specimens and should not be used as a sole basis for treatment. Nasal washings and aspirates are unacceptable for Xpert Xpress SARS-CoV-2/FLU/RSV testing.  Fact Sheet for Patients: BloggerCourse.com  Fact Sheet for Healthcare Providers: SeriousBroker.it  This test is not yet approved or cleared by the [place] FDA and has been authorized for detection and/or diagnosis of SARS-CoV-2 by FDA under an Emergency Use Authorization (EUA). This EUA will remain in effect (meaning this test can be used) for the duration of the COVID-19 declaration under Section 564(b)(1) of the Act, 21 U.S.C. section 360bbb-3(b)(1), unless the authorization is terminated or revoked.     Resp Syncytial Virus by PCR NEGATIVE NEGATIVE Final    Comment: (NOTE) Fact Sheet for Patients: BloggerCourse.com  Fact Sheet for Healthcare  Providers: SeriousBroker.it  This test is not yet approved or cleared by the [place] FDA and has been authorized for detection and/or diagnosis of SARS-CoV-2 by FDA under an Emergency Use Authorization (EUA). This EUA will remain in effect (meaning this test can be used) for the duration of the COVID-19 declaration under Section 564(b)(1) of the Act, 21 U.S.C. section 360bbb-3(b)(1), unless the authorization is terminated or revoked.  Performed at [facility], [address] [place], [place] [address]   Aerobic Culture w Gram Stain (superficial specimen)     Status: None (Preliminary result)   Collection Time: 08/24/23  2:48 PM   Specimen: Breast  Result Value Ref Range Status   Specimen Description   Final    BREAST Performed at [facility], [address] [place], [place] [address]    Special Requests   Final    RIGHT Performed at [facility], [address], [place], [place] [address]    Gram Stain   Final    ABUNDANT WBC PRESENT, PREDOMINANTLY PMN RARE GRAM POSITIVE COCCI    Culture   Final    FEW STAPHYLOCOCCUS AUREUS SUSCEPTIBILITIES TO FOLLOW Performed at [facility], [address] [place], [place] [address]    Report Status PENDING  Incomplete  Culture, blood (Routine X 2) w Reflex to ID Panel     Status: None (Preliminary result)   Collection Time: 08/25/23  5:19 AM   Specimen: BLOOD  Result Value Ref Range Status   Specimen Description   Final    BLOOD BLOOD RIGHT HAND Performed at [facility], [address] [place], [place] [address]    Special Requests   Final    BOTTLES DRAWN AEROBIC ONLY Blood Culture results may not be optimal due to an inadequate volume of blood received in culture bottles Performed at [facility], [address] [place], [place] [address]    Culture   Final    NO GROWTH 2 DAYS Performed at  [facility], [address], [place], [place] [address]    Report Status PENDING  Incomplete  Culture, blood (Routine X 2) w Reflex to ID Panel     Status: None (Preliminary result)   Collection Time: 08/25/23  5:19 AM   Specimen: BLOOD  Result Value Ref Range Status   Specimen Description   Final    BLOOD SITE NOT SPECIFIED Performed at [facility], [address], [place], [place] [address]    Special Requests   Final    BOTTLES DRAWN AEROBIC ONLY Blood Culture results may not be optimal due to an inadequate volume of blood received in culture bottles Performed at [facility], [address] [place], [place] [address]    Culture   Final    NO GROWTH 2 DAYS Performed at [facility], [address], [place], [place] [address]    Report Status PENDING  Incomplete    Please note: You were cared for by a hospitalist during your hospital stay. Once you are discharged, your primary care physician will handle any further medical issues. Please note that NO REFILLS for any discharge medications will be authorized once you are discharged, as it is imperative that you return to your primary care physician (or establish a relationship with a primary care physician if you do not have one) for your post hospital discharge needs so that they can reassess your need for medications and monitor your lab values.    Time coordinating discharge: 40 minutes  SIGNED:   Burnadette Pop, MD  [facility] 08/27/2023, 9:11 AM Pager [phone]  If 7PM-7AM, please contact night-coverage [email or web address] Password [email or web address]

## 2023-08-26 NOTE — Progress Notes (Signed)
 This RN talked with both patient and patient's RN friend Kriste Basque about dressing changes at home. Both were not comfortable discharging without further teaching. WOC is to see this patient tomorrow for dressing change and teaching. Pt agreeable to stay overnight for teaching tomorrow.

## 2023-08-26 NOTE — TOC Transition Note (Addendum)
 Transition of Care [facility]) - Discharge Note   Patient Details  Name: Diamond Marshall MRN: [id number] Date of Birth: [birth year]  [facility] CM/SW Contact:  Adrian Prows, RN Phone Number: 08/26/2023, 2:35 PM   Clinical Narrative:    D/C reviewed; [employer] wound vac in room, and proof of delivery signed; Lauren, RN notified that hospital wound vac needs to removed and new device from [employer] need to be placed; no TOC needs.   Final next level of care: Home/Self Care Barriers to Discharge: No Barriers Identified   Patient Goals and CMS Choice Patient states their goals for this hospitalization and ongoing recovery are:: Home CMS Medicare.gov Compare Post Acute Care list provided to:: Patient Choice offered to / list presented to : Patient [place] ownership interest in [facility].provided to:: Patient    Discharge Placement                       Discharge Plan and Services Additional resources added to the After Visit Summary for     Discharge Planning Services: CM Consult Post Acute Care Choice: Home Health          DME Arranged: Negative pressure wound device DME Agency: [employer] Date DME Agency Contacted: 08/24/23 Time DME Agency Contacted: [phone] Representative spoke with at DME Agency: [facility] HH Arranged: NA HH Agency: NA        Social Drivers of Health (SDOH) Interventions SDOH Screenings   Food Insecurity: No Food Insecurity (08/23/2023)  Housing: Low Risk  (08/23/2023)  Transportation Needs: No Transportation Needs (08/23/2023)  Utilities: Not At Risk (08/23/2023)  Depression (PHQ2-9): Low Risk  (08/06/2023)  Social Connections: Moderately Integrated (07/26/2023)  Tobacco Use: Low Risk  (08/23/2023)     Readmission Risk Interventions     No data to display

## 2023-08-27 ENCOUNTER — Telehealth (HOSPITAL_COMMUNITY): Payer: Self-pay | Admitting: Pharmacy Technician

## 2023-08-27 ENCOUNTER — Other Ambulatory Visit (HOSPITAL_COMMUNITY): Payer: Self-pay

## 2023-08-27 LAB — AEROBIC CULTURE W GRAM STAIN (SUPERFICIAL SPECIMEN)

## 2023-08-27 NOTE — Progress Notes (Signed)
 Patient seen at bedside this morning.  She was lying on bed, comfortable.  Wound care nurse was educating her on the wound VAC on the left breast.  She can be discharged today to home.  No new change  in the medical management.  Discharge summary and orders already placed yesterday

## 2023-08-27 NOTE — Telephone Encounter (Signed)
 Patient Product/process development scientist completed.    The patient is insured through [employer]. Patient has [employer], may use a copay card, and/or apply for patient assistance if available.    Ran test claim for linezolid (Zyvox) 600 mg Requires Prior Authorization   This test claim was processed through [facility]- copay amounts may vary at other pharmacies due to pharmacy/plan contracts, or as the patient moves through the different stages of their insurance plan.     Roland Earl, CPHT Pharmacy Technician III Certified Patient Advocate [facility] Patient Advocate Team Direct Number: [phone]  Fax: [phone]

## 2023-08-27 NOTE — Progress Notes (Signed)
Reviewed discharge instructions with patient, all questions answered.

## 2023-08-27 NOTE — TOC Transition Note (Signed)
 Transition of Care [facility]) - Discharge Note   Patient Details  Name: Diamond Marshall MRN: [id number] Date of Birth: 12-28-1970  [facility]) CM/SW Contact:  Lanier Clam, RN Phone Number: 08/27/2023, 9:57 AM   Clinical Narrative:  Per TOC-wound vac delivered;No HHC agency for [facility],.Patient's friend to asst w/home wound vac dsg changes after teaching done.No further TOC needs      Final next level of care: Home/Self Care Barriers to Discharge: No Barriers Identified   Patient Goals and CMS Choice Patient states their goals for this hospitalization and ongoing recovery are:: Home CMS Medicare.gov Compare Post Acute Care list provided to:: Patient Choice offered to / list presented to : Patient [place] ownership interest in [facility].provided to:: Patient    Discharge Placement                       Discharge Plan and Services Additional resources added to the After Visit Summary for     Discharge Planning Services: CM Consult Post Acute Care Choice: Home Health          DME Arranged: Negative pressure wound device DME Agency: [employer] Date DME Agency Contacted: 08/24/23 Time DME Agency Contacted: [phone] Representative spoke with at DME Agency: [facility] HH Arranged: NA HH Agency: NA        Social Drivers of Health (SDOH) Interventions SDOH Screenings   Food Insecurity: No Food Insecurity (08/23/2023)  Housing: Low Risk  (08/23/2023)  Transportation Needs: No Transportation Needs (08/23/2023)  Utilities: Not At Risk (08/23/2023)  Depression (PHQ2-9): Low Risk  (08/06/2023)  Social Connections: Moderately Integrated (07/26/2023)  Tobacco Use: Low Risk  (08/23/2023)     Readmission Risk Interventions     No data to display

## 2023-08-27 NOTE — Plan of Care (Signed)

## 2023-08-27 NOTE — Telephone Encounter (Signed)
 Pharmacy Patient Advocate Encounter  Received notification from [facility] that Prior Authorization for Linezolid 600MG  tablets has been APPROVED from 08/27/2023 to 09/26/2023. Ran test claim, Copay is $0.00. This test claim was processed through [facility]- copay amounts may vary at other pharmacies due to pharmacy/plan contracts, or as the patient moves through the different stages of their insurance plan.   PA #/Case ID/Reference #: [id number] Key: [id number]

## 2023-08-27 NOTE — Progress Notes (Signed)
 Patient rang out stating that her wound vac was no longer attached to her chest. Upon assessment, it was loose and not working properly. Area to left chest was redressed and the cannister was changed. Wound vac is working appropriately at this time.

## 2023-08-27 NOTE — Consult Note (Signed)
 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 Discussed with patient to monitor  Dressing procedure/placement/frequency: Removed old NPWT dressing (1) pc of black foam Filled wound with __1__ piece of black foam  Ostomy barrier rings used in strip form in skin folds around incision. Provided extra barrier rings in supplies for home, instructed patient and on video that barrier rings can stay in place for dressing changes if they do not come off with drape when dressings removed.   Sealed NPWT dressing at HG, hooked to home unit Patient tolerated dressing change well Video made of dressing change for patient to use for "nurse" friend to change dressing. Suggested dressing changes 2x wk Q Mon/Thurs. Discussed showering, dicussed alarms of NPWT device and how to resolve. Discussed need to remove dressing is seal lost and can not be regained within 2 hours. Patient has saline and gauze at home for packing. She is able to verbalize understanding of this. Supplies in the room, dressing kits, canisters. Silver hydrofiber and foam for right breast wound. Tape to change to dry dressing when foam supplies are exhausted.  Demonstrated right breast wound care to patient Friday.  Charger placed in the box with home supplies for home VAC unit. WOC email contact provided.   Patient to follow up in surgeon's office; call to schedule FU.   Michela Herst [facility], CNS, [employer] [phone]

## 2023-08-28 ENCOUNTER — Telehealth: Payer: Self-pay | Admitting: *Deleted

## 2023-08-28 LAB — CULTURE, BLOOD (ROUTINE X 2)
Culture: NO GROWTH
Special Requests: ADEQUATE

## 2023-08-28 NOTE — Transitions of Care (Post Inpatient/ED Visit) (Signed)
   08/28/2023  Name: Diamond Marshall MRN: [id number] DOB: [birth year]  Today's TOC FU Call Status: Today's TOC FU Call Status:: Unsuccessful Call (1st Attempt) Unsuccessful Call (1st Attempt) Date: 08/28/23  Attempted to reach the patient regarding the most recent Inpatient/ED visit.  Follow Up Plan: Additional outreach attempts will be made to reach the patient to complete the Transitions of Care (Post Inpatient/ED visit) call.   Irving Shows [facility], BSN RN Care Manager/ Transition of Care [place]/ [facility] [phone]

## 2023-08-29 ENCOUNTER — Telehealth: Payer: Self-pay | Admitting: Hematology and Oncology

## 2023-08-29 ENCOUNTER — Telehealth: Payer: Self-pay | Admitting: *Deleted

## 2023-08-29 NOTE — Transitions of Care (Post Inpatient/ED Visit) (Signed)
 08/29/2023  Name: Diamond Marshall MRN: [id number] DOB: April 05, 1971  Today's TOC FU Call Status: Today's TOC FU Call Status:: Successful TOC FU Call Completed TOC FU Call Complete Date: 08/29/23 Patient's Name and Date of Birth confirmed.  Transition Care Management Follow-up Telephone Call Date of Discharge: 08/27/23 Discharge Facility: [facility]) Type of Discharge: Inpatient Admission Primary Inpatient Discharge Diagnosis:: Sepsis How have you been since you were released from the hospital?: Better Any questions or concerns?: Yes Patient Questions/Concerns:: I was having problems with the wound vac and it kept going off. I went to the surgeon today and they removed the vac and put a dressing on it. Patient Questions/Concerns Addressed: Other: (RN check to see what wound centers was available to care for patient. [place] [facility] is not taking new patients until May the [place] center is to far.)  Items Reviewed: Did you receive and understand the discharge instructions provided?: Yes Medications obtained,verified, and reconciled?: Yes (Medications Reviewed) Any new allergies since your discharge?: No Dietary orders reviewed?: No Do you have support at home?: Yes People in Home [RPT]: spouse Name of Support/Comfort Primary Source: Casimiro Needle and friend  Medications Reviewed Today: Medications Reviewed Today     Reviewed by Luella Cook, RN (Case Manager) on 08/29/23 at 1524  Med List Status: <None>   Medication Order Taking? Sig Documenting Provider Last Dose Status Informant  acetaminophen (TYLENOL) 325 MG tablet [id number] Yes Take 2 tablets (650 mg total) by mouth every 6 (six) hours as needed for mild pain (pain score 1-3) (or Fever >/= 101). Harriette Bouillon, MD Taking Active Self  budesonide (ENTOCORT EC) 3 MG 24 hr capsule [id number]  TAKE THREE CAPSULES BY MOUTH DAILY  Patient not taking: Reported on 08/23/2023   Rachel Moulds, MD  Active Self  carvedilol (COREG)  3.125 MG tablet [id number] Yes TAKE ONE TABLET TWICE DAILY WITH MEAL(S) Rachel Moulds, MD Taking Active Self  colestipol (COLESTID) 1 g tablet [id number]  Take 2 tablets (2 g total) by mouth 2 (two) times daily.  Patient not taking: Reported on 08/23/2023   Unk Lightning, [place]  Active Self  cyanocobalamin (VITAMIN B12) 1000 MCG/ML injection [id number] Yes INJECT 1 ML INTRAMUSCULARLY EVERY 15 DAYS  Patient taking differently: Inject 1,000 mcg into the skin every 30 (thirty) days.   Serena Croissant, MD Taking Active Self  cycloSPORINE (RESTASIS) 0.05 % ophthalmic emulsion [id number] Yes Place 1 drop into both eyes 2 (two) times daily. Rachel Moulds, MD Taking Active Self  diphenoxylate-atropine (LOMOTIL) 2.5-0.025 MG tablet [id number] No TAKE TWO TABLETS EVERY 6 HOURS AS NEEDED FOR DIARRHEA  Patient not taking: Reported on 08/29/2023   Rachel Moulds, MD Not Taking Active Self  EPINEPHrine 0.3 mg/0.3 mL IJ SOAJ injection [id number] Yes Inject 0.3 mg into the muscle as needed for anaphylaxis. Gwenlyn Fudge, FNP Taking Active Self           Med Note Antony Madura, KATHY N   Thu Aug 23, 2023  4:47 PM)    escitalopram (LEXAPRO) 20 MG tablet [id number] Yes Take 1 tablet (20 mg total) by mouth daily. Please take citalopram 10 mg( half pill) only for next 3 days starting from tomorrow.  Then stop it until you finish the course of linezolid.  Can resume previous dose after  2 days of finishing linezolid Burnadette Pop, MD Taking Active   hydrocortisone (ANUSOL-HC) 2.5 % rectal cream [id number]  Place 1 Application rectally 2 (two) times daily.  Patient not taking: Reported on  08/23/2023   Garlon Hatchet, PA-C  Active Self           Med Note Antony Madura, Arn Medal   Thu Aug 23, 2023  4:35 PM)    linezolid (ZYVOX) 600 MG tablet [id number] Yes Take 1 tablet (600 mg total) by mouth every 12 (twelve) hours for 14 days. Burnadette Pop, MD Taking Active   LORazepam (ATIVAN) 0.5 MG tablet [id number] Yes Take 1 tablet (0.5 mg  total) by mouth every 8 (eight) hours as needed for anxiety. Rachel Moulds, MD Taking Active Self  methocarbamol (ROBAXIN) 500 MG tablet [id number] No Take 1 tablet (500 mg total) by mouth every 6 (six) hours as needed for muscle spasms.  Patient not taking: Reported on 08/23/2023   Harriette Bouillon, MD Not Taking Active Self  oxyCODONE (OXY IR/ROXICODONE) 5 MG immediate release tablet [id number] Yes Take 1 tablet (5 mg total) by mouth every 6 (six) hours as needed for severe pain (pain score 7-10). Harriette Bouillon, MD Taking Active Self  pantoprazole (PROTONIX) 40 MG tablet [id number] Yes Take 1 tablet (40 mg total) by mouth daily.  Patient taking differently: Take 40 mg by mouth See admin instructions. Take 40 mg by mouth in the morning 30 minutes before breakfast and an additional 40 mg in the evening as needed for unresolved acid reflux symptoms   Mechele Claude, MD Taking Active Self  potassium chloride (KLOR-CON) 10 MEQ tablet [id number] Yes TAKE TWO TABLETS DAILY AS DIRECTED  Patient taking differently: Take 20 mEq by mouth in the morning.   Loa Socks, NP Taking Active Self            Home Care and Equipment/Supplies: Were Home Health Services Ordered?: NA (HH was ordered for wound vac, because patient went back to work they do not consider this homebound and the insurance will not cover for the care to be given anywhere beside home.) Any new equipment or medical supplies ordered?: Yes Name of Medical supply agency?: [employer] Were you able to get the equipment/medical supplies?: Yes Do you have any questions related to the use of the equipment/supplies?: Yes What questions do you have?: Wound vac keeps going off and insurance doesn't cover HHN to come out  Functional Questionnaire: Do you need assistance with bathing/showering or dressing?: No Do you need assistance with meal preparation?: Yes Do you need assistance with eating?: No Do you have difficulty maintaining  continence: No Do you need assistance with getting out of bed/getting out of a chair/moving?: No Do you have difficulty managing or taking your medications?: No  Follow up appointments reviewed: PCP Follow-up appointment confirmed?: No MD Provider Line [phone] Given: Yes (Patient will schedule appt with PCP) Specialist Hospital Follow-up appointment confirmed?: Yes Date of Specialist follow-up appointment?: 08/31/23 Follow-Up Specialty Provider:: Maisie Fus Cornett Do you need transportation to your follow-up appointment?: No Do you understand care options if your condition(s) worsen?: Yes-patient verbalized understanding  SDOH Interventions Today    Flowsheet Row Most Recent Value  SDOH Interventions   Food Insecurity Interventions Intervention Not Indicated  Housing Interventions Intervention Not Indicated  Transportation Interventions Intervention Not Indicated, Patient Resources (Friends/Family)  Utilities Interventions Intervention Not Indicated       Gean Maidens BSN RN [facility] Health Care Management Coordinator [email or web address] Direct Dial: [phone]  Fax: [phone] Website: [email or web address]

## 2023-08-29 NOTE — Telephone Encounter (Signed)
 Spoke with patient confirming upcoming appointment

## 2023-08-30 ENCOUNTER — Other Ambulatory Visit: Payer: Self-pay | Admitting: *Deleted

## 2023-08-30 ENCOUNTER — Encounter: Payer: Self-pay | Admitting: Hematology and Oncology

## 2023-08-30 LAB — CULTURE, BLOOD (ROUTINE X 2)
Culture: NO GROWTH
Culture: NO GROWTH

## 2023-08-30 MED ORDER — FLUCONAZOLE 100 MG PO TABS: 200.0000 mg | ORAL_TABLET | Freq: Every day | ORAL | 0 refills | Status: DC

## 2023-08-31 ENCOUNTER — Ambulatory Visit: Payer: Commercial Managed Care - PPO | Attending: Nurse Practitioner | Admitting: Nurse Practitioner

## 2023-08-31 NOTE — Progress Notes (Deleted)
 Office Visit    Patient Name: Diamond Marshall Date of Encounter: 08/31/2023  Primary Care Provider:  Mechele Claude, MD Primary Cardiologist:  None  Chief Complaint    53 year old female with the above past medical history including tachycardia, inflammatory breast cancer s/p chemotherapy, bilateral mastectomy hypertension, hypothyroidism, GERD, obesity, anxiety, and depression who presents for follow-up related to palpitations, cardio-oncology surveillance.   Past Medical History    Past Medical History:  Diagnosis Date   Abdominal hernia    Anemia    b12 deficiency   Anxiety    Arthritis    B12 deficiency    Back pain    Cancer (HCC)    breast cancer   Depression    Family history of adverse reaction to anesthesia    mother vomits after anesthesia   Gallbladder problem    GERD (gastroesophageal reflux disease)    History of hiatal hernia    "small" per patient   Hypertension    Hypothyroidism    Joint pain    Knee pain    Palpitation    "when iron is low"   Pernicious anemia    SOB (shortness of breath)    Thyroid disease    hypothyroidism   Vitamin D deficiency    Past Surgical History:  Procedure Laterality Date   BREAST BIOPSY Left 06/09/2022   [place] LT BREAST BX W LOC DEV 1ST LESION IMG BX SPEC US GUIDE 06/09/2022 [facility] MAMMOGRAPHY   CESAREAN SECTION  1999   Dilatation and currettagement     for miscarriage 18 years ago   PORTACATH PLACEMENT Right 07/05/2022   Procedure: INSERTION PORT-A-CATH;  Surgeon: Harriette Bouillon, MD;  Location: [facility] OR;  Service: General;  Laterality: Right;   SIMPLE MASTECTOMY WITH AXILLARY SENTINEL NODE BIOPSY Bilateral 07/26/2023   Procedure: LEFT SKIN REDUCING MASTECTOMY, RIGHT SKIN SPARING, RISK REDUCING MASTECTOMY;  Surgeon: Harriette Bouillon, MD;  Location: [facility] OR;  Service: General;  Laterality: Bilateral;  PEC BLOCK    Allergies  Allergies  Allergen Reactions   Phentermine Other (See Comments)    Became suicidal on the  medication   Bee Venom Swelling and Other (See Comments)    Severe swelling where stung   Lactose Intolerance (Gi) Diarrhea   Peanut Allergen Powder-Dnfp Other (See Comments)    Migraines   Topiramate Other (See Comments)    Dizziness    Sulfa Antibiotics Itching and Rash   Tape Rash   Wound Dressing Adhesive Rash     Labs/Other Studies Reviewed    The following studies were reviewed today:  Cardiac Studies & Procedures   ______________________________________________________________________________________________     ECHOCARDIOGRAM  ECHOCARDIOGRAM LIMITED 06/27/2023  Narrative ECHOCARDIOGRAM LIMITED REPORT    Patient Name:   Diamond Marshall Date of Exam: 06/27/2023 Medical Rec #:  [id number]     Height:       62.0 in Accession #:    [id number]    Weight:       295.8 lb Date of Birth:  [birth year]     BSA:          2.258 m Patient Age:    52 years      BP:           156/81 mmHg Patient Gender: F             HR:           75 bpm. Ex[facility]  Procedure: Limited Echo and Strain Analysis  Indications:    Encounter for monitoring cardiotoxic drug therapy  History:        Patient has prior history of Echocardiogram examinations, most recent 04/13/2023. Risk Factors:Hypertension and Dyslipidemia. Malignant neoplasm of upper-inner quadrant of left breast in female, estrogen receptor positive (HCC).  Sonographer:    Celesta Gentile RCS Referring Phys: [phone] Maisie Fus   Sonographer Comments: Global longitudinal strain was attempted. IMPRESSIONS   1. Limited study. 2. Left ventricular ejection fraction, by estimation, is 55 to 60%. The left ventricle has normal function. The left ventricle has no regional wall motion abnormalities. 3. Right ventricular systolic function is normal. The right ventricular size is normal. 4. The inferior vena cava is normal in size with greater than 50% respiratory variability, suggesting right atrial pressure of 3  mmHg.  Comparison(s): Prior images reviewed side by side. LVEF normal range at 55-60%.  FINDINGS Left Ventricle: Left ventricular ejection fraction, by estimation, is 55 to 60%. The left ventricle has normal function. The left ventricle has no regional wall motion abnormalities. Global longitudinal strain performed but not reported based on interpreter judgement due to suboptimal tracking. The left ventricular internal cavity size was normal in size. There is no left ventricular hypertrophy.  Right Ventricle: The right ventricular size is normal. No increase in right ventricular wall thickness. Right ventricular systolic function is normal.  Pericardium: There is no evidence of pericardial effusion. Presence of epicardial fat layer.  Aorta: The aortic root is normal in size and structure.  Venous: The inferior vena cava is normal in size with greater than 50% respiratory variability, suggesting right atrial pressure of 3 mmHg.  IAS/Shunts: No atrial level shunt detected by color flow Doppler.  LEFT VENTRICLE PLAX 2D LVIDd:         4.70 cm LVIDs:         3.20 cm LV PW:         1.00 cm LV IVS:        1.00 cm LVOT diam:     1.90 cm LVOT Area:     2.84 cm  LV Volumes (MOD) LV vol d, MOD A2C: 82.7 ml LV vol d, MOD A4C: 115.0 ml LV vol s, MOD A2C: 33.3 ml LV vol s, MOD A4C: 45.9 ml LV SV MOD A2C:     49.4 ml LV SV MOD A4C:     115.0 ml LV SV MOD BP:      59.5 ml  RIGHT VENTRICLE TAPSE (M-mode): 2.2 cm  LEFT ATRIUM         Index LA diam:    3.70 cm 1.64 cm/m  AORTA Ao Root diam: 2.90 cm   SHUNTS Systemic Diam: 1.90 cm  Nona Dell MD Electronically signed by Nona Dell MD Signature Date/Time: 06/27/2023/12:23:31 PM    Final          ______________________________________________________________________________________________     Recent Labs: 02/08/2023: Magnesium 1.7 06/01/2023: BNP 32.1 08/23/2023: ALT 11 08/25/2023: BUN 14; Creatinine, Ser 0.90;  Hemoglobin 8.3; Platelets 230; Potassium 3.4; Sodium 136  Recent Lipid Panel    Component Value Date/Time   CHOL 153 07/21/2021 1511   CHOL 158 10/09/2012 1537   TRIG 152 (H) 07/21/2021 1511   TRIG 255 (H) 07/24/2013 1111   TRIG 221 (H) 10/09/2012 1537   HDL 38 (L) 07/21/2021 1511   HDL 59 07/24/2013 1111   HDL 42 10/09/2012 1537   CHOLHDL 4.0 07/21/2021 1511   LDLCALC 88 07/21/2021 1511   LDLCALC 37 07/24/2013  1111   LDLCALC 72 10/09/2012 1537    History of Present Illness   53 year old female with a history of tachycardia, inflammatory breast cancer s/p chemotherapy, bilateral mastectomy hypertension, hypothyroidism, GERD, obesity, anxiety, and depression.   She has a history of stage IV left-sided breast cancer s/p chemotherapy.  Echocardiogram in 03/2023 showed EF 50 to 55%, low normal LV function, G1 DD, normal RV systolic function, no significant valvular abnormalities. She was referred to cardiology because her echocardiogram showed concern for possible change in her LV function. She was seen in the ED in December 2024 in the setting of palpitations.  ED workup was unremarkable.  She was last seen in the office on 06/01/2023 and was stable from a cardiac standpoint.  She denied any recurrent palpitations, denied chest pain, dyspnea.  Repeat echocardiogram in 06/2023 showed EF 55 to 60%, normal LV function, no RWMA, normal RV systolic function, no significant valvular abnormalities.  It was noted that should she pursue additional chemotherapy, repeat echocardiogram should be completed in 3 months.  She underwent bilateral simple mastectomy with lymph node dissection on 07/26/2023 in the setting of inflammatory breast cancer.  She was hospitalized from 08/23/2023 to 08/26/2023 in the setting of sepsis secondary to cellulitis of the bilateral breasts.  Wound cultures showed MRSA.   ID was consulted. She was treated with antibiotics.  She presents today for follow-up.  Since her last  visit  Cardio-oncology surveillance: Tachycardia/palpitations: History of breast cancer: Disposition:  Home Medications    Current Outpatient Medications  Medication Sig Dispense Refill   acetaminophen (TYLENOL) 325 MG tablet Take 2 tablets (650 mg total) by mouth every 6 (six) hours as needed for mild pain (pain score 1-3) (or Fever >/= 101). 60 tablet 0   budesonide (ENTOCORT EC) 3 MG 24 hr capsule TAKE THREE CAPSULES BY MOUTH DAILY (Patient not taking: Reported on 08/23/2023) 90 capsule 1   carvedilol (COREG) 3.125 MG tablet TAKE ONE TABLET TWICE DAILY WITH MEAL(S) 60 tablet 0   colestipol (COLESTID) 1 g tablet Take 2 tablets (2 g total) by mouth 2 (two) times daily. (Patient not taking: Reported on 08/23/2023) 60 tablet 3   cyanocobalamin (VITAMIN B12) 1000 MCG/ML injection INJECT 1 ML INTRAMUSCULARLY EVERY 15 DAYS (Patient taking differently: Inject 1,000 mcg into the skin every 30 (thirty) days.) 30 mL 3   cycloSPORINE (RESTASIS) 0.05 % ophthalmic emulsion Place 1 drop into both eyes 2 (two) times daily. 5.5 mL 1   diphenoxylate-atropine (LOMOTIL) 2.5-0.025 MG tablet TAKE TWO TABLETS EVERY 6 HOURS AS NEEDED FOR DIARRHEA (Patient not taking: Reported on 08/29/2023) 60 tablet 1   EPINEPHrine 0.3 mg/0.3 mL IJ SOAJ injection Inject 0.3 mg into the muscle as needed for anaphylaxis. 2 each 0   escitalopram (LEXAPRO) 20 MG tablet Take 1 tablet (20 mg total) by mouth daily. Please take citalopram 10 mg( half pill) only for next 3 days starting from tomorrow.  Then stop it until you finish the course of linezolid.  Can resume previous dose after  2 days of finishing linezolid     fluconazole (DIFLUCAN) 100 MG tablet Take 2 tablets (200 mg total) by mouth daily. 14 tablet 0   hydrocortisone (ANUSOL-HC) 2.5 % rectal cream Place 1 Application rectally 2 (two) times daily. (Patient not taking: Reported on 08/23/2023) 30 g 2   linezolid (ZYVOX) 600 MG tablet Take 1 tablet (600 mg total) by mouth every 12  (twelve) hours for 14 days. 28 tablet 0   LORazepam (  ATIVAN) 0.5 MG tablet Take 1 tablet (0.5 mg total) by mouth every 8 (eight) hours as needed for anxiety. 30 tablet 1   methocarbamol (ROBAXIN) 500 MG tablet Take 1 tablet (500 mg total) by mouth every 6 (six) hours as needed for muscle spasms. (Patient not taking: Reported on 08/23/2023) 20 tablet 0   oxyCODONE (OXY IR/ROXICODONE) 5 MG immediate release tablet Take 1 tablet (5 mg total) by mouth every 6 (six) hours as needed for severe pain (pain score 7-10). 15 tablet 0   pantoprazole (PROTONIX) 40 MG tablet Take 1 tablet (40 mg total) by mouth daily. (Patient taking differently: Take 40 mg by mouth See admin instructions. Take 40 mg by mouth in the morning 30 minutes before breakfast and an additional 40 mg in the evening as needed for unresolved acid reflux symptoms) 90 tablet 3   potassium chloride (KLOR-CON) 10 MEQ tablet TAKE TWO TABLETS DAILY AS DIRECTED (Patient taking differently: Take 20 mEq by mouth in the morning.) 60 tablet 0   No current facility-administered medications for this visit.     Review of Systems    ***.  All other systems reviewed and are otherwise negative except as noted above.    Physical Exam    VS:  LMP 10/13/2019 (Approximate)  , BMI There is no height or weight on file to calculate BMI.     GEN: Well nourished, well developed, in no acute distress. HEENT: normal. Neck: Supple, no JVD, carotid bruits, or masses. Cardiac: RRR, no murmurs, rubs, or gallops. No clubbing, cyanosis, edema.  Radials/DP/PT 2+ and equal bilaterally.  Respiratory:  Respirations regular and unlabored, clear to auscultation bilaterally. GI: Soft, nontender, nondistended, BS + x 4. MS: no deformity or atrophy. Skin: warm and dry, no rash. Neuro:  Strength and sensation are intact. Psych: Normal affect.  Accessory Clinical Findings    ECG personally reviewed by me today -    - no acute changes.   Lab Results  Component Value  Date   WBC 8.1 08/25/2023   HGB 8.3 (L) 08/25/2023   HCT 27.0 (L) 08/25/2023   MCV 97.8 08/25/2023   PLT 230 08/25/2023   Lab Results  Component Value Date   CREATININE 0.90 08/25/2023   BUN 14 08/25/2023   NA 136 08/25/2023   K 3.4 (L) 08/25/2023   CL 106 08/25/2023   CO2 22 08/25/2023   Lab Results  Component Value Date   ALT 11 08/23/2023   AST 27 08/23/2023   ALKPHOS 65 08/23/2023   BILITOT 0.8 08/23/2023   Lab Results  Component Value Date   CHOL 153 07/21/2021   HDL 38 (L) 07/21/2021   LDLCALC 88 07/21/2021   TRIG 152 (H) 07/21/2021   CHOLHDL 4.0 07/21/2021    Lab Results  Component Value Date   HGBA1C 5.7 (H) 07/21/2021    Assessment & Plan    1.  ***  No BP recorded.  {Refresh Note OR Click here to enter BP  :1}***   Joylene Grapes, NP 08/31/2023, 5:58 AM

## 2023-09-03 ENCOUNTER — Inpatient Hospital Stay

## 2023-09-03 ENCOUNTER — Inpatient Hospital Stay (HOSPITAL_BASED_OUTPATIENT_CLINIC_OR_DEPARTMENT_OTHER): Admitting: Hematology and Oncology

## 2023-09-03 ENCOUNTER — Other Ambulatory Visit: Payer: Self-pay

## 2023-09-03 VITALS — BP 147/64 | HR 69 | Temp 98.0°F | Resp 18 | Ht 61.0 in | Wt 268.4 lb

## 2023-09-03 DIAGNOSIS — Z9013 Acquired absence of bilateral breasts and nipples: Secondary | ICD-10-CM | POA: Diagnosis not present

## 2023-09-03 DIAGNOSIS — Z803 Family history of malignant neoplasm of breast: Secondary | ICD-10-CM | POA: Diagnosis not present

## 2023-09-03 DIAGNOSIS — Z8041 Family history of malignant neoplasm of ovary: Secondary | ICD-10-CM | POA: Diagnosis not present

## 2023-09-03 DIAGNOSIS — Z800 Family history of malignant neoplasm of digestive organs: Secondary | ICD-10-CM | POA: Diagnosis not present

## 2023-09-03 DIAGNOSIS — Z170 Estrogen receptor positive status [ER+]: Secondary | ICD-10-CM | POA: Diagnosis present

## 2023-09-03 DIAGNOSIS — C50212 Malignant neoplasm of upper-inner quadrant of left female breast: Secondary | ICD-10-CM

## 2023-09-03 DIAGNOSIS — R5383 Other fatigue: Secondary | ICD-10-CM | POA: Diagnosis not present

## 2023-09-03 DIAGNOSIS — E739 Lactose intolerance, unspecified: Secondary | ICD-10-CM | POA: Diagnosis not present

## 2023-09-03 LAB — CMP (CANCER CENTER ONLY)
ALT: 11 U/L (ref 0–44)
AST: 25 U/L (ref 15–41)
Albumin: 3.4 g/dL — ABNORMAL LOW (ref 3.5–5.0)
Alkaline Phosphatase: 69 U/L (ref 38–126)
Anion gap: 5 (ref 5–15)
BUN: 7 mg/dL (ref 6–20)
CO2: 29 mmol/L (ref 22–32)
Calcium: 9.1 mg/dL (ref 8.9–10.3)
Chloride: 107 mmol/L (ref 98–111)
Creatinine: 0.73 mg/dL (ref 0.44–1.00)
GFR, Estimated: >60 mL/min (ref >60)
Glucose, Bld: 95 mg/dL (ref 70–99)
Potassium: 3.8 mmol/L (ref 3.5–5.1)
Sodium: 141 mmol/L (ref 135–145)
Total Bilirubin: 0.3 mg/dL (ref 0.0–1.2)
Total Protein: 7.1 g/dL (ref 6.5–8.1)

## 2023-09-03 LAB — CBC WITH DIFFERENTIAL (CANCER CENTER ONLY)
Abs Immature Granulocytes: 0.01 10*3/uL (ref 0.00–0.07)
Basophils Absolute: 0 10*3/uL (ref 0.0–0.1)
Basophils Relative: 1 %
Eosinophils Absolute: 0.2 10*3/uL (ref 0.0–0.5)
Eosinophils Relative: 4 %
HCT: 30.9 % — ABNORMAL LOW (ref 36.0–46.0)
Hemoglobin: 9.5 g/dL — ABNORMAL LOW (ref 12.0–15.0)
Immature Granulocytes: 0 %
Lymphocytes Relative: 23 %
Lymphs Abs: 1.3 10*3/uL (ref 0.7–4.0)
MCH: 28.7 pg (ref 26.0–34.0)
MCHC: 30.7 g/dL (ref 30.0–36.0)
MCV: 93.4 fL (ref 80.0–100.0)
Monocytes Absolute: 0.2 10*3/uL (ref 0.1–1.0)
Monocytes Relative: 4 %
Neutro Abs: 3.8 10*3/uL (ref 1.7–7.7)
Neutrophils Relative %: 68 %
Platelet Count: 294 10*3/uL (ref 150–400)
RBC: 3.31 MIL/uL — ABNORMAL LOW (ref 3.87–5.11)
RDW: 17.3 % — ABNORMAL HIGH (ref 11.5–15.5)
WBC Count: 5.5 10*3/uL (ref 4.0–10.5)
nRBC: 0 % (ref 0.0–0.2)

## 2023-09-03 NOTE — Progress Notes (Signed)
 Patient Care Team: Mechele Claude, MD as PCP - General (Family Medicine) Harriette Bouillon, MD as Consulting Physician (General Surgery) Rachel Moulds, MD as Consulting Physician (Hematology and Oncology) Lonie Peak, MD as Attending Physician (Radiation Oncology) Pershing Proud, RN as Oncology Nurse Navigator Donnelly Angelica, RN as Oncology Nurse Navigator   SUMMARY OF ONCOLOGIC HISTORY: Oncology History  Malignant neoplasm of upper-inner quadrant of left breast in female, estrogen receptor positive (HCC)  06/06/2022 Mammogram   Diagnostic mammogram given palpable mass showed suspicious spiculated left breast mass with surrounding nodularity at 9 o'clock position.  This measures up to 3.1 cm sonographically however due to deep location and patient body habitus the mammographic measurement of 5.8 cm is felt to be more accurate.  At least 2 abnormal lymph nodes in the left axilla.  Possible abnormal palpable left supraclavicular lymph node.   06/06/2022 Breast US   Breast ultrasound confirmed these findings.   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  Prognostic showed ER 65% weak to strong staining, PR 15% weak to moderate staining, Ki-67 of 50% and HER2 positive by IHC at 3+    Genetic Testing   [facility] Custom Panel+RNA was Negative. Report date is 06/28/2022.   The Custom Hereditary Cancers Panel offered by [facility] includes sequencing and/or deletion duplication testing of the following 43 genes: APC, ATM, AXIN2, BAP1, BARD1, BMPR1A, BRCA1, BRCA2, BRIP1, CDH1, CDK4, CDKN2A (p14ARF and p16INK4a only), CHEK2, CTNNA1, EPCAM (Deletion/duplication testing only), FH, GREM1 (promoter region duplication testing only), HOXB13, KIT, MBD4, MEN1, MLH1, MSH2, MSH3, MSH6, MUTYH, NF1, NHTL1, PALB2, PDGFRA, PMS2, POLD1, POLE, PTEN,  RAD51C, RAD51D, SMAD4, SMARCA4. STK11, TP53, TSC1, TSC2, and VHL.    06/29/2022 PET scan   IMPRESSION: 1. Hypermetabolic left breast mass, compatible with primary breast malignancy. 2. Hypermetabolic left cervical, axillary, subpectoral, supraclavicular and prevascular mediastinal lymph nodes, compatible with nodal metastatic disease. 3. No evidence of metastatic disease in the abdomen or pelvis. 4. Aortic Atherosclerosis (ICD10-I70.0).     Electronically Signed   By: Allegra Lai M.D.   On: 06/29/2022 09:32   07/11/2022 - 01/16/2023 Chemotherapy   Patient is on Treatment Plan : BREAST DOCEtaxel + Trastuzumab + Pertuzumab (THP) q21d x 8 cycles / Trastuzumab + Pertuzumab q21d x 4 cycles     02/01/2023 - 06/29/2023 Chemotherapy   Patient is on Treatment Plan : BREAST METASTATIC Fam-Trastuzumab Deruxtecan-nxki (Enhertu) (5.4) q21d     09/06/2023 -  Chemotherapy   Patient is on Treatment Plan : BREAST ADO-Trastuzumab Emtansine (Kadcyla) q21d       CHIEF COMPLIANT: S/p bilateral mastectomy  Discussed the use of AI scribe software for clinical note transcription with the patient, who gave verbal consent to proceed.  History of Present Illness   Discussed the use of AI scribe software for clinical note transcription with the patient, who gave verbal consent to proceed.  History of Present Illness Diamond Marshall is a 53 year old female who presents for follow-up after hospitalization for MRSA sepsis.  She was hospitalized for a week due to MRSA sepsis, during which a wound culture was performed, and she was started on linezolid. She is currently still on linezolid and expects to complete the course in about a week. She has some redness on her skin, suspected to be yeast, for which she was prescribed Diflucan.  She  has been using a wound vac, which she finds beneficial in her recovery. The wound vac is changed approximately once a week, and it took about a week to fill up last time. She  mentions a lot of blood today, which she understands to be a good sign of healing. She manages wound care with the help of a nurse friend, as she is not eligible for home health due to returning to work.  She experiences significant fatigue following her hospitalization and treatment. She is concerned about the port on the same side as the infection but notes that it seems fine and does not hurt.  She was discharged from the hospital on August 26, 2023, and has a follow-up appointment on September 10, 2023. She is washing the affected area with Dr. Wenona Hamilton to maintain cleanliness. She has no prior history of MRSA but has had strep infections in the past.  No pain or issues with the port site and no significant drainage from the wound vac. The tape used for the wound vac has irritated her skin.   ALLERGIES:  is allergic to phentermine, bee venom, lactose intolerance (gi), peanut allergen powder-dnfp, topiramate, sulfa antibiotics, tape, and wound dressing adhesive.  MEDICATIONS:  Current Outpatient Medications  Medication Sig Dispense Refill   acetaminophen (TYLENOL) 325 MG tablet Take 2 tablets (650 mg total) by mouth every 6 (six) hours as needed for mild pain (pain score 1-3) (or Fever >/= 101). 60 tablet 0   budesonide (ENTOCORT EC) 3 MG 24 hr capsule TAKE THREE CAPSULES BY MOUTH DAILY (Patient not taking: Reported on 08/23/2023) 90 capsule 1   carvedilol (COREG) 3.125 MG tablet TAKE ONE TABLET TWICE DAILY WITH MEAL(S) 60 tablet 0   colestipol (COLESTID) 1 g tablet Take 2 tablets (2 g total) by mouth 2 (two) times daily. (Patient not taking: Reported on 08/23/2023) 60 tablet 3   cyanocobalamin (VITAMIN B12) 1000 MCG/ML injection INJECT 1 ML INTRAMUSCULARLY EVERY 15 DAYS (Patient taking differently: Inject 1,000 mcg into the skin every 30 (thirty) days.) 30 mL 3   cycloSPORINE (RESTASIS) 0.05 % ophthalmic emulsion Place 1 drop into both eyes 2 (two) times daily. 5.5 mL 1   diphenoxylate-atropine (LOMOTIL)  2.5-0.025 MG tablet TAKE TWO TABLETS EVERY 6 HOURS AS NEEDED FOR DIARRHEA (Patient not taking: Reported on 08/29/2023) 60 tablet 1   EPINEPHrine 0.3 mg/0.3 mL IJ SOAJ injection Inject 0.3 mg into the muscle as needed for anaphylaxis. 2 each 0   escitalopram (LEXAPRO) 20 MG tablet Take 1 tablet (20 mg total) by mouth daily. Please take citalopram 10 mg( half pill) only for next 3 days starting from tomorrow.  Then stop it until you finish the course of linezolid.  Can resume previous dose after  2 days of finishing linezolid     fluconazole (DIFLUCAN) 100 MG tablet Take 2 tablets (200 mg total) by mouth daily. 14 tablet 0   hydrocortisone (ANUSOL-HC) 2.5 % rectal cream Place 1 Application rectally 2 (two) times daily. (Patient not taking: Reported on 08/23/2023) 30 g 2   linezolid (ZYVOX) 600 MG tablet Take 1 tablet (600 mg total) by mouth every 12 (twelve) hours for 14 days. 28 tablet 0   LORazepam (ATIVAN) 0.5 MG tablet Take 1 tablet (0.5 mg total) by mouth every 8 (eight) hours as needed for anxiety. 30 tablet 1   methocarbamol (ROBAXIN) 500 MG tablet Take 1 tablet (500 mg total) by mouth every 6 (six) hours as needed for muscle spasms. (Patient not  taking: Reported on 08/23/2023) 20 tablet 0   oxyCODONE (OXY IR/ROXICODONE) 5 MG immediate release tablet Take 1 tablet (5 mg total) by mouth every 6 (six) hours as needed for severe pain (pain score 7-10). 15 tablet 0   pantoprazole (PROTONIX) 40 MG tablet Take 1 tablet (40 mg total) by mouth daily. (Patient taking differently: Take 40 mg by mouth See admin instructions. Take 40 mg by mouth in the morning 30 minutes before breakfast and an additional 40 mg in the evening as needed for unresolved acid reflux symptoms) 90 tablet 3   potassium chloride (KLOR-CON) 10 MEQ tablet TAKE TWO TABLETS DAILY AS DIRECTED (Patient taking differently: Take 20 mEq by mouth in the morning.) 60 tablet 0   No current facility-administered medications for this visit.    PHYSICAL  EXAMINATION: ECOG PERFORMANCE STATUS: 1 - Symptomatic but completely ambulatory  Vitals:   09/03/23 1005  BP: (!) 147/64  Pulse: 69  Resp: 18  Temp: 98 F (36.7 C)  SpO2: 100%   Filed Weights   09/03/23 1005  Weight: 268 lb 6.4 oz (121.7 kg)     NECK: No abnormalities noted upon palpation. Breast:  Currently wound vac in place with minimal drainage. Right breast in dressing, some serous fluid appears to have soaked the dressing   LABORATORY DATA:  I have reviewed the data as listed    Latest Ref Rng & Units 08/25/2023    5:19 AM 08/24/2023    5:04 AM 08/23/2023    9:40 AM  CMP  Glucose 70 - 99 mg/dL 96  92  161   BUN 6 - 20 mg/dL 14  18  12    Creatinine 0.44 - 1.00 mg/dL 0.96  0.45  4.09   Sodium 135 - 145 mmol/L 136  134  135   Potassium 3.5 - 5.1 mmol/L 3.4  3.5  3.5   Chloride 98 - 111 mmol/L 106  104  101   CO2 22 - 32 mmol/L 22  19  21    Calcium 8.9 - 10.3 mg/dL 8.2  8.2  8.5   Total Protein 6.5 - 8.1 g/dL   7.3   Total Bilirubin 0.0 - 1.2 mg/dL   0.8   Alkaline Phos 38 - 126 U/L   65   AST 15 - 41 U/L   27   ALT 0 - 44 U/L   11     Lab Results  Component Value Date   WBC 5.5 09/03/2023   HGB 9.5 (L) 09/03/2023   HCT 30.9 (L) 09/03/2023   MCV 93.4 09/03/2023   PLT 294 09/03/2023   NEUTROABS 3.8 09/03/2023    ASSESSMENT & PLAN:  Malignant neoplasm of upper-inner quadrant of left breast in female, 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  (Left breast mass, left cervical, axillary, subpectoral, supraclavicular and prevascular mediastinal lymph nodes, no metastasis in the abdomen)  07/11/2022-01/16/2023: THP followed by HP maintenance Started Enhertu second line 02/01/2023, now s/p bilateral mastectomy and no evidence of disease. We discussed about kadcyla maintenance. Assessment & Plan MRSA sepsis Hospitalized for MRSA sepsis, on linezolid for another week. Wound healing with healthy granulation tissue.  On Diflucan for yeast infection. Wound vac in use, minimal drainage. Port near infection site appears fine. Fatigue likely from infection and treatment. - Continue linezolid for another week. - Continue Diflucan for yeast infection. - Manage wound with wound vac. - Consult with Dr. Mignon Pine on April 21 for  further management. - Monitor port for signs of infection.  Fatigue Fatigue likely due to MRSA sepsis and treatment, expected to improve post-antibiotic course. - Assess fatigue improvement post-linezolid completion.  Follow-up Follow-up to reassess condition and consider further treatment with [place] post-recovery. - Schedule follow-up appointment for April 28 after consultation with Dr. Cornette. - Reassess condition and treatment plan during follow-up.   Total time spent: 30 mins including face to face time and time spent for planning, charting and co-ordination of care   Murleen Arms, MD 09/03/23

## 2023-09-04 ENCOUNTER — Telehealth: Payer: Self-pay | Admitting: Hematology and Oncology

## 2023-09-04 NOTE — Telephone Encounter (Signed)
 Spoke with patient confirming upcoming appointment

## 2023-09-05 ENCOUNTER — Other Ambulatory Visit: Payer: Self-pay

## 2023-09-10 NOTE — Progress Notes (Signed)
 Pharmacist Chemotherapy Monitoring - Initial Assessment    Anticipated start date: 09/17/23   The following has been reviewed per standard work regarding the patient's treatment regimen: The patient's diagnosis, treatment plan and drug doses, and organ/hematologic function Lab orders and baseline tests specific to treatment regimen  The treatment plan start date, drug sequencing, and pre-medications Prior authorization status  Patient's documented medication list, including drug-drug interaction screen and prescriptions for anti-emetics and supportive care specific to the treatment regimen The drug concentrations, fluid compatibility, administration routes, and timing of the medications to be used The patient's access for treatment and lifetime cumulative dose history, if applicable  The patient's medication allergies and previous infusion related reactions, if applicable   Changes made to treatment plan:  N/A  Follow up needed:  N/A   Mana Haberl, PharmD, MBA

## 2023-09-13 ENCOUNTER — Encounter: Payer: Self-pay | Admitting: Hematology and Oncology

## 2023-09-14 ENCOUNTER — Encounter: Payer: Self-pay | Admitting: Hematology and Oncology

## 2023-09-17 ENCOUNTER — Other Ambulatory Visit: Payer: Self-pay | Admitting: Adult Health

## 2023-09-17 ENCOUNTER — Inpatient Hospital Stay (HOSPITAL_BASED_OUTPATIENT_CLINIC_OR_DEPARTMENT_OTHER): Admitting: Hematology and Oncology

## 2023-09-17 ENCOUNTER — Inpatient Hospital Stay

## 2023-09-17 ENCOUNTER — Other Ambulatory Visit: Payer: Self-pay | Admitting: *Deleted

## 2023-09-17 ENCOUNTER — Other Ambulatory Visit: Payer: Self-pay | Admitting: Hematology and Oncology

## 2023-09-17 VITALS — BP 129/65 | HR 80 | Temp 98.0°F | Resp 17 | Wt 268.4 lb

## 2023-09-17 DIAGNOSIS — E876 Hypokalemia: Secondary | ICD-10-CM

## 2023-09-17 DIAGNOSIS — C50212 Malignant neoplasm of upper-inner quadrant of left female breast: Secondary | ICD-10-CM | POA: Diagnosis not present

## 2023-09-17 DIAGNOSIS — Z170 Estrogen receptor positive status [ER+]: Secondary | ICD-10-CM | POA: Diagnosis not present

## 2023-09-17 DIAGNOSIS — Z95828 Presence of other vascular implants and grafts: Secondary | ICD-10-CM

## 2023-09-17 LAB — CBC WITH DIFFERENTIAL (CANCER CENTER ONLY)
Abs Immature Granulocytes: 0.03 10*3/uL (ref 0.00–0.07)
Basophils Absolute: 0 10*3/uL (ref 0.0–0.1)
Basophils Relative: 1 %
Eosinophils Absolute: 0.2 10*3/uL (ref 0.0–0.5)
Eosinophils Relative: 3 %
HCT: 28.5 % — ABNORMAL LOW (ref 36.0–46.0)
Hemoglobin: 9.2 g/dL — ABNORMAL LOW (ref 12.0–15.0)
Immature Granulocytes: 1 %
Lymphocytes Relative: 27 %
Lymphs Abs: 1.7 10*3/uL (ref 0.7–4.0)
MCH: 28.2 pg (ref 26.0–34.0)
MCHC: 32.3 g/dL (ref 30.0–36.0)
MCV: 87.4 fL (ref 80.0–100.0)
Monocytes Absolute: 0.4 10*3/uL (ref 0.1–1.0)
Monocytes Relative: 6 %
Neutro Abs: 4 10*3/uL (ref 1.7–7.7)
Neutrophils Relative %: 62 %
Platelet Count: 233 10*3/uL (ref 150–400)
RBC: 3.26 MIL/uL — ABNORMAL LOW (ref 3.87–5.11)
RDW: 17.6 % — ABNORMAL HIGH (ref 11.5–15.5)
WBC Count: 6.3 10*3/uL (ref 4.0–10.5)
nRBC: 0 % (ref 0.0–0.2)

## 2023-09-17 LAB — CMP (CANCER CENTER ONLY)
ALT: 13 U/L (ref 0–44)
AST: 29 U/L (ref 15–41)
Albumin: 3.8 g/dL (ref 3.5–5.0)
Alkaline Phosphatase: 76 U/L (ref 38–126)
Anion gap: 7 (ref 5–15)
BUN: 11 mg/dL (ref 6–20)
CO2: 28 mmol/L (ref 22–32)
Calcium: 9.1 mg/dL (ref 8.9–10.3)
Chloride: 105 mmol/L (ref 98–111)
Creatinine: 0.62 mg/dL (ref 0.44–1.00)
GFR, Estimated: >60 mL/min (ref >60)
Glucose, Bld: 89 mg/dL (ref 70–99)
Potassium: 4 mmol/L (ref 3.5–5.1)
Sodium: 140 mmol/L (ref 135–145)
Total Bilirubin: 0.4 mg/dL (ref 0.0–1.2)
Total Protein: 7.3 g/dL (ref 6.5–8.1)

## 2023-09-17 MED ORDER — SODIUM CHLORIDE 0.9% FLUSH
10.0000 mL | Freq: Once | INTRAVENOUS | Status: AC
  Administered 2023-09-17: 10 mL

## 2023-09-17 NOTE — Progress Notes (Signed)
 Patient Care Team: Roise Cleaver, MD as PCP - General (Family Medicine) Sim Dryer, MD as Consulting Physician (General Surgery) Murleen Arms, MD as Consulting Physician (Hematology and Oncology) Colie Dawes, MD as Attending Physician (Radiation Oncology) Auther Bo, RN as Oncology Nurse Navigator Alane Hsu, RN as Oncology Nurse Navigator   SUMMARY OF ONCOLOGIC HISTORY: Oncology History  Malignant neoplasm of upper-inner quadrant of left breast in female, estrogen receptor positive (HCC)  06/06/2022 Mammogram   Diagnostic mammogram given palpable mass showed suspicious spiculated left breast mass with surrounding nodularity at 9 o'clock position.  This measures up to 3.1 cm sonographically however due to deep location and patient body habitus the mammographic measurement of 5.8 cm is felt to be more accurate.  At least 2 abnormal lymph nodes in the left axilla.  Possible abnormal palpable left supraclavicular lymph node.   06/06/2022 Breast US    Breast ultrasound confirmed these findings.   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  Prognostic showed ER 65% weak to strong staining, PR 15% weak to moderate staining, Ki-67 of 50% and HER2 positive by IHC at 3+    Genetic Testing   [facility] Custom Panel+RNA was Negative. Report date is 06/28/2022.   The Custom Hereditary Cancers Panel offered by [facility] includes sequencing and/or deletion duplication testing of the following 43 genes: APC, ATM, AXIN2, BAP1, BARD1, BMPR1A, BRCA1, BRCA2, BRIP1, CDH1, CDK4, CDKN2A (p14ARF and p16INK4a only), CHEK2, CTNNA1, EPCAM (Deletion/duplication testing only), FH, GREM1 (promoter region duplication testing only), HOXB13, KIT, MBD4, MEN1, MLH1, MSH2, MSH3, MSH6, MUTYH, NF1, NHTL1, PALB2, PDGFRA, PMS2, POLD1, POLE, PTEN,  RAD51C, RAD51D, SMAD4, SMARCA4. STK11, TP53, TSC1, TSC2, and VHL.    06/29/2022 PET scan   IMPRESSION: 1. Hypermetabolic left breast mass, compatible with primary breast malignancy. 2. Hypermetabolic left cervical, axillary, subpectoral, supraclavicular and prevascular mediastinal lymph nodes, compatible with nodal metastatic disease. 3. No evidence of metastatic disease in the abdomen or pelvis. 4. Aortic Atherosclerosis (ICD10-I70.0).     Electronically Signed   By: Avelino Lek M.D.   On: 06/29/2022 09:32   07/11/2022 - 01/16/2023 Chemotherapy   Patient is on Treatment Plan : BREAST DOCEtaxel  + Trastuzumab  + Pertuzumab  (THP) q21d x 8 cycles / Trastuzumab  + Pertuzumab  q21d x 4 cycles     02/01/2023 - 06/29/2023 Chemotherapy   Patient is on Treatment Plan : BREAST METASTATIC Fam-Trastuzumab Deruxtecan-nxki  (Enhertu ) (5.4) q21d     09/17/2023 -  Chemotherapy   Patient is on Treatment Plan : BREAST ADO-Trastuzumab  Emtansine (Kadcyla) q21d       CHIEF COMPLIANT: S/p bilateral mastectomy  History of Present Illness   Diamond Marshall is a 53 year old female who presents with persistent wound drainage and infection. She was referred by Cornett's office for evaluation of persistent infection.  She completed a course of linezolid  for an infection but continues to experience persistent drainage from the wound site. Yellow and green pus is present when changing the dressing, noted last night and again this morning. During a visit to Cornette's office this morning, it was confirmed that some infection remains.  The cellulitis on the right breast has shown improvement, but a pocket of pus persists. The wound on the left side has closed significantly. She changes the dressing twice daily, morning and night. Recently, the drainage has not been soaking  the dressing, although there have been times when it did. She reports a low-grade fever of 99.74F but no chills.  She is currently not on any  antibiotics but is expected to start the newly prescribed sulfa medication soon.   ALLERGIES:  is allergic to phentermine , bee venom, lactose intolerance (gi), peanut allergen powder-dnfp, topiramate , sulfa antibiotics, tape, and wound dressing adhesive.  MEDICATIONS:  Current Outpatient Medications  Medication Sig Dispense Refill   acetaminophen  (TYLENOL ) 325 MG tablet Take 2 tablets (650 mg total) by mouth every 6 (six) hours as needed for mild pain (pain score 1-3) (or Fever >/= 101). 60 tablet 0   budesonide  (ENTOCORT EC ) 3 MG 24 hr capsule TAKE THREE CAPSULES BY MOUTH DAILY (Patient not taking: Reported on 08/23/2023) 90 capsule 1   carvedilol  (COREG ) 3.125 MG tablet TAKE ONE TABLET TWICE DAILY WITH MEAL(S) 60 tablet 0   colestipol  (COLESTID ) 1 g tablet Take 2 tablets (2 g total) by mouth 2 (two) times daily. (Patient not taking: Reported on 08/23/2023) 60 tablet 3   cyanocobalamin  (VITAMIN B12) 1000 MCG/ML injection INJECT 1 ML INTRAMUSCULARLY EVERY 15 DAYS (Patient taking differently: Inject 1,000 mcg into the skin every 30 (thirty) days.) 30 mL 3   cycloSPORINE  (RESTASIS ) 0.05 % ophthalmic emulsion Place 1 drop into both eyes 2 (two) times daily. 5.5 mL 1   diphenoxylate -atropine  (LOMOTIL ) 2.5-0.025 MG tablet TAKE TWO TABLETS EVERY 6 HOURS AS NEEDED FOR DIARRHEA (Patient not taking: Reported on 08/29/2023) 60 tablet 1   EPINEPHrine  0.3 mg/0.3 mL IJ SOAJ injection Inject 0.3 mg into the muscle as needed for anaphylaxis. 2 each 0   escitalopram  (LEXAPRO ) 20 MG tablet Take 1 tablet (20 mg total) by mouth daily. Please take citalopram 10 mg( half pill) only for next 3 days starting from tomorrow.  Then stop it until you finish the course of linezolid .  Can resume previous dose after  2 days of finishing linezolid      fluconazole  (DIFLUCAN ) 100 MG tablet Take 2 tablets (200 mg total) by mouth daily. 14 tablet 0   hydrocortisone  (ANUSOL -HC) 2.5 % rectal cream Place 1 Application rectally 2 (two) times  daily. (Patient not taking: Reported on 08/23/2023) 30 g 2   LORazepam  (ATIVAN ) 0.5 MG tablet Take 1 tablet (0.5 mg total) by mouth every 8 (eight) hours as needed for anxiety. 30 tablet 1   methocarbamol  (ROBAXIN ) 500 MG tablet Take 1 tablet (500 mg total) by mouth every 6 (six) hours as needed for muscle spasms. (Patient not taking: Reported on 08/23/2023) 20 tablet 0   oxyCODONE  (OXY IR/ROXICODONE ) 5 MG immediate release tablet Take 1 tablet (5 mg total) by mouth every 6 (six) hours as needed for severe pain (pain score 7-10). 15 tablet 0   pantoprazole  (PROTONIX ) 40 MG tablet Take 1 tablet (40 mg total) by mouth daily. (Patient taking differently: Take 40 mg by mouth See admin instructions. Take 40 mg by mouth in the morning 30 minutes before breakfast and an additional 40 mg in the evening as needed for unresolved acid reflux symptoms) 90 tablet 3   potassium chloride  (KLOR-CON ) 10 MEQ tablet TAKE TWO TABLETS DAILY AS DIRECTED 60 tablet 0   No current facility-administered medications for this visit.    PHYSICAL EXAMINATION: ECOG PERFORMANCE STATUS: 1 - Symptomatic but completely ambulatory  Vitals:   09/17/23 1231  BP: 129/65  Pulse: 80  Resp: 17  Temp: 98 F (36.7 C)  SpO2: 100%   Filed Weights   09/17/23  1231  Weight: 268 lb 6.4 oz (121.7 kg)     NECK: No abnormalities noted upon palpation. Breast:  Currently wound vac of the left breast in place with minimal drainage. Right breast in dressing, some serosanguinous discharge. No overt pus noted during my exam. No cellulitis changes of the skin.   LABORATORY DATA:  I have reviewed the data as listed    Latest Ref Rng & Units 09/03/2023    9:36 AM 08/25/2023    5:19 AM 08/24/2023    5:04 AM  CMP  Glucose 70 - 99 mg/dL 95  96  92   BUN 6 - 20 mg/dL 7  14  18    Creatinine 0.44 - 1.00 mg/dL 1.61  0.96  0.45   Sodium 135 - 145 mmol/L 141  136  134   Potassium 3.5 - 5.1 mmol/L 3.8  3.4  3.5   Chloride 98 - 111 mmol/L 107  106  104    CO2 22 - 32 mmol/L 29  22  19    Calcium 8.9 - 10.3 mg/dL 9.1  8.2  8.2   Total Protein 6.5 - 8.1 g/dL 7.1     Total Bilirubin 0.0 - 1.2 mg/dL 0.3     Alkaline Phos 38 - 126 U/L 69     AST 15 - 41 U/L 25     ALT 0 - 44 U/L 11       Lab Results  Component Value Date   WBC 6.3 09/17/2023   HGB 9.2 (L) 09/17/2023   HCT 28.5 (L) 09/17/2023   MCV 87.4 09/17/2023   PLT 233 09/17/2023   NEUTROABS 4.0 09/17/2023    ASSESSMENT & PLAN:  Malignant neoplasm of upper-inner quadrant of left breast in female, 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  (Left breast mass, left cervical, axillary, subpectoral, supraclavicular and prevascular mediastinal lymph nodes, no metastasis in the abdomen)  07/11/2022-01/16/2023: THP followed by HP maintenance Started Enhertu  second line 02/01/2023, now s/p bilateral mastectomy and no evidence of disease. We discussed about kadcyla maintenance. Assessment & Plan Breast cancer Breast cancer treatment with Kadcyla on hold due to active infection. Herceptin  and Perjeta  discussed as interim options if infection delays Kadcyla for much longer - Hold Kadcyla treatment until infection resolves. - Consider Herceptin  and Perjeta  if infection persists during next visit with us . - Follow up in the second week of May to reassess treatment options.  Infection with pus Persistent localized infection with purulent discharge. Sulfa antibiotic prescribed at Dr Cornet's office. . No systemic symptoms present. - Monitor for fever and systemic symptoms. - Communicate with Cornett's office for updates on infection status to determine timing for Kadcyla initiation.  Cellulitis Cellulitis improving with no systemic signs of infection. Wound significantly closed. - Continue monitoring for systemic symptoms such as fever or chills.   Total time spent: 30 mins including face to face time and time spent for planning,  charting and co-ordination of care   Murleen Arms, MD 09/17/23

## 2023-09-18 ENCOUNTER — Telehealth: Payer: Self-pay | Admitting: Hematology and Oncology

## 2023-09-18 NOTE — Telephone Encounter (Signed)
 Confirmed with pt scheduled appt date and time

## 2023-09-19 ENCOUNTER — Telehealth: Payer: Self-pay

## 2023-09-19 NOTE — Telephone Encounter (Signed)
 Pt returned my phone call. I let her know that her FMLA forms were completed,faxed, and confirmation received. Pt copy was emailed to her as well as mailed to her address. Pt verbalized understanding. No questions or concerns at this time.

## 2023-09-19 NOTE — Telephone Encounter (Signed)
 Left a VM to the pt regarding her FMLA.

## 2023-09-24 ENCOUNTER — Other Ambulatory Visit: Payer: Self-pay | Admitting: Hematology and Oncology

## 2023-09-25 ENCOUNTER — Encounter: Payer: Self-pay | Admitting: Hematology and Oncology

## 2023-09-25 ENCOUNTER — Encounter: Payer: Self-pay | Admitting: Adult Health

## 2023-10-02 ENCOUNTER — Inpatient Hospital Stay

## 2023-10-02 ENCOUNTER — Encounter: Payer: Self-pay | Admitting: Adult Health

## 2023-10-02 ENCOUNTER — Other Ambulatory Visit: Payer: Self-pay | Admitting: *Deleted

## 2023-10-02 ENCOUNTER — Inpatient Hospital Stay: Attending: Hematology and Oncology | Admitting: Adult Health

## 2023-10-02 VITALS — BP 141/61 | HR 83 | Temp 98.0°F | Resp 18 | Ht 61.0 in | Wt 277.2 lb

## 2023-10-02 VITALS — BP 138/80 | HR 87

## 2023-10-02 DIAGNOSIS — C9000 Multiple myeloma not having achieved remission: Secondary | ICD-10-CM

## 2023-10-02 DIAGNOSIS — Z09 Encounter for follow-up examination after completed treatment for conditions other than malignant neoplasm: Secondary | ICD-10-CM | POA: Diagnosis not present

## 2023-10-02 DIAGNOSIS — Z8041 Family history of malignant neoplasm of ovary: Secondary | ICD-10-CM | POA: Insufficient documentation

## 2023-10-02 DIAGNOSIS — Z800 Family history of malignant neoplasm of digestive organs: Secondary | ICD-10-CM | POA: Insufficient documentation

## 2023-10-02 DIAGNOSIS — Z803 Family history of malignant neoplasm of breast: Secondary | ICD-10-CM | POA: Diagnosis not present

## 2023-10-02 DIAGNOSIS — Z170 Estrogen receptor positive status [ER+]: Secondary | ICD-10-CM | POA: Diagnosis present

## 2023-10-02 DIAGNOSIS — Z9013 Acquired absence of bilateral breasts and nipples: Secondary | ICD-10-CM | POA: Insufficient documentation

## 2023-10-02 DIAGNOSIS — E739 Lactose intolerance, unspecified: Secondary | ICD-10-CM | POA: Diagnosis not present

## 2023-10-02 DIAGNOSIS — Z5112 Encounter for antineoplastic immunotherapy: Secondary | ICD-10-CM | POA: Insufficient documentation

## 2023-10-02 DIAGNOSIS — C50212 Malignant neoplasm of upper-inner quadrant of left female breast: Secondary | ICD-10-CM

## 2023-10-02 LAB — CBC WITH DIFFERENTIAL (CANCER CENTER ONLY)
Abs Immature Granulocytes: 0.01 10*3/uL (ref 0.00–0.07)
Basophils Absolute: 0.1 10*3/uL (ref 0.0–0.1)
Basophils Relative: 1 %
Eosinophils Absolute: 0.4 10*3/uL (ref 0.0–0.5)
Eosinophils Relative: 6 %
HCT: 28.6 % — ABNORMAL LOW (ref 36.0–46.0)
Hemoglobin: 8.9 g/dL — ABNORMAL LOW (ref 12.0–15.0)
Immature Granulocytes: 0 %
Lymphocytes Relative: 31 %
Lymphs Abs: 2 10*3/uL (ref 0.7–4.0)
MCH: 26.5 pg (ref 26.0–34.0)
MCHC: 31.1 g/dL (ref 30.0–36.0)
MCV: 85.1 fL (ref 80.0–100.0)
Monocytes Absolute: 0.4 10*3/uL (ref 0.1–1.0)
Monocytes Relative: 6 %
Neutro Abs: 3.7 10*3/uL (ref 1.7–7.7)
Neutrophils Relative %: 56 %
Platelet Count: 255 10*3/uL (ref 150–400)
RBC: 3.36 MIL/uL — ABNORMAL LOW (ref 3.87–5.11)
RDW: 16.9 % — ABNORMAL HIGH (ref 11.5–15.5)
WBC Count: 6.5 10*3/uL (ref 4.0–10.5)
nRBC: 0 % (ref 0.0–0.2)

## 2023-10-02 LAB — CMP (CANCER CENTER ONLY)
ALT: 12 U/L (ref 0–44)
AST: 23 U/L (ref 15–41)
Albumin: 3.8 g/dL (ref 3.5–5.0)
Alkaline Phosphatase: 102 U/L (ref 38–126)
Anion gap: 6 (ref 5–15)
BUN: 11 mg/dL (ref 6–20)
CO2: 29 mmol/L (ref 22–32)
Calcium: 9.2 mg/dL (ref 8.9–10.3)
Chloride: 107 mmol/L (ref 98–111)
Creatinine: 0.64 mg/dL (ref 0.44–1.00)
GFR, Estimated: >60 mL/min (ref >60)
Glucose, Bld: 101 mg/dL — ABNORMAL HIGH (ref 70–99)
Potassium: 4.2 mmol/L (ref 3.5–5.1)
Sodium: 142 mmol/L (ref 135–145)
Total Bilirubin: 0.3 mg/dL (ref 0.0–1.2)
Total Protein: 7.3 g/dL (ref 6.5–8.1)

## 2023-10-02 MED ORDER — ACETAMINOPHEN 325 MG PO TABS
650.0000 mg | ORAL_TABLET | Freq: Once | ORAL | Status: AC
  Administered 2023-10-02: 650 mg via ORAL
  Filled 2023-10-02: qty 2

## 2023-10-02 MED ORDER — SODIUM CHLORIDE 0.9 % IV SOLN: INTRAVENOUS | Status: DC

## 2023-10-02 MED ORDER — SODIUM CHLORIDE 0.9% FLUSH: 10.0000 mL | INTRAVENOUS | Status: DC | PRN

## 2023-10-02 MED ORDER — DIPHENHYDRAMINE HCL 25 MG PO CAPS
50.0000 mg | ORAL_CAPSULE | Freq: Once | ORAL | Status: AC
  Administered 2023-10-02: 50 mg via ORAL
  Filled 2023-10-02: qty 2

## 2023-10-02 MED ORDER — ADO-TRASTUZUMAB EMTANSINE CHEMO INJECTION 160 MG
3.6000 mg/kg | Freq: Once | INTRAVENOUS | Status: AC
  Administered 2023-10-02: 480 mg via INTRAVENOUS
  Filled 2023-10-02: qty 24

## 2023-10-02 MED ORDER — PROCHLORPERAZINE MALEATE 10 MG PO TABS
10.0000 mg | ORAL_TABLET | Freq: Once | ORAL | Status: AC
Start: 2023-10-02 — End: 2023-10-02
  Administered 2023-10-02: 10 mg via ORAL
  Filled 2023-10-02: qty 1

## 2023-10-02 MED ORDER — HEPARIN SOD (PORK) LOCK FLUSH 100 UNIT/ML IV SOLN
500.0000 [IU] | Freq: Once | INTRAVENOUS | Status: DC | PRN
Start: 2023-10-02 — End: 2023-10-02

## 2023-10-02 NOTE — Progress Notes (Signed)
 [facility] Cancer Follow up:    Diamond Cleaver, MD [address] [place] [address]   DIAGNOSIS:  Cancer Staging  Malignant neoplasm of upper-inner quadrant of left breast in female, estrogen receptor positive (HCC) Staging form: Breast, AJCC 8th Edition - Clinical stage from 06/21/2022: Stage IV (cT3, cN3, cM1, G3, ER+, PR+, HER2+) - Signed by Murleen Arms, MD on 09/17/2023 Stage prefix: Initial diagnosis Histologic grading system: 3 grade system Stage used in treatment planning: Yes National guidelines used in treatment planning: Yes Type of national guideline used in treatment planning: NCCN    SUMMARY OF ONCOLOGIC HISTORY: Oncology History  Malignant neoplasm of upper-inner quadrant of left breast in female, estrogen receptor positive (HCC)  06/06/2022 Mammogram   Diagnostic mammogram given palpable mass showed suspicious spiculated left breast mass with surrounding nodularity at 9 o'clock position.  This measures up to 3.1 cm sonographically however due to deep location and patient body habitus the mammographic measurement of 5.8 cm is felt to be more accurate.  At least 2 abnormal lymph nodes in the left axilla.  Possible abnormal palpable left supraclavicular lymph node.   06/06/2022 Breast US    Breast ultrasound confirmed these findings.   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  Prognostic showed ER 65% weak to strong staining, PR 15% weak to moderate staining, Ki-67 of 50% and HER2 positive by IHC at 3+    Genetic Testing   [facility] Custom Panel+RNA was Negative. Report date is 06/28/2022.   The Custom Hereditary Cancers Panel offered by [facility] includes sequencing and/or deletion duplication testing of the following 43 genes: APC, ATM, AXIN2, BAP1, BARD1, BMPR1A, BRCA1, BRCA2, BRIP1,  CDH1, CDK4, CDKN2A (p14ARF and p16INK4a only), CHEK2, CTNNA1, EPCAM (Deletion/duplication testing only), FH, GREM1 (promoter region duplication testing only), HOXB13, KIT, MBD4, MEN1, MLH1, MSH2, MSH3, MSH6, MUTYH, NF1, NHTL1, PALB2, PDGFRA, PMS2, POLD1, POLE, PTEN, RAD51C, RAD51D, SMAD4, SMARCA4. STK11, TP53, TSC1, TSC2, and VHL.    06/21/2022 Cancer Staging   Staging form: Breast, AJCC 8th Edition - Clinical stage from 06/21/2022: Stage IV (cT3, cN3, cM1, G3, ER+, PR+, HER2+) - Signed by Murleen Arms, MD on 09/17/2023 Stage prefix: Initial diagnosis Histologic grading system: 3 grade system Stage used in treatment planning: Yes National guidelines used in treatment planning: Yes Type of national guideline used in treatment planning: NCCN   06/29/2022 PET scan   IMPRESSION: 1. Hypermetabolic left breast mass, compatible with primary breast malignancy. 2. Hypermetabolic left cervical, axillary, subpectoral, supraclavicular and prevascular mediastinal lymph nodes, compatible with nodal metastatic disease. 3. No evidence of metastatic disease in the abdomen or pelvis. 4. Aortic Atherosclerosis (ICD10-I70.0).     Electronically Signed   By: Avelino Lek M.D.   On: 06/29/2022 09:32   07/11/2022 - 01/16/2023 Chemotherapy   Patient is on Treatment Plan : BREAST DOCEtaxel  + Trastuzumab  + Pertuzumab  (THP) q21d x 8 cycles / Trastuzumab  + Pertuzumab  q21d x 4 cycles     02/01/2023 - 06/29/2023 Chemotherapy   Patient is on Treatment Plan : BREAST METASTATIC Fam-Trastuzumab Deruxtecan-nxki  (Enhertu ) (5.4) q21d     10/02/2023 -  Chemotherapy   Patient is on Treatment Plan : BREAST ADO-Trastuzumab  Emtansine (Kadcyla) q21d       CURRENT THERAPY: Kadcyla  INTERVAL HISTORY:  [name] 53 y.o. female returns for follow-up and evaluation  prior to beginning Kadcyla.  She did not receive this last month due to infection however she tells me the infection is completely resolved and she is ready to  proceed with therapy today.  He notes some mild occasional headaches but tells me that she has trouble seeing with her glasses and needs to get them adjusted.  She denies any other associated symptoms.   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   Breast cancer, stage 4 (HCC) 07/26/2023   Gastroenteritis 09/29/2022   Port-A-Cath in place 07/07/2022   Genetic testing 06/29/2022   Malignant neoplasm of upper-inner quadrant of left breast in female, estrogen receptor positive (HCC) 06/19/2022   Chronic pain of right knee 03/27/2022   Essential hypertension 03/27/2022   Positive self-administered antigen test for COVID-19 03/15/2021   Prediabetes 07/22/2019   Anxiety and depression 03/15/2016   Vitamin D  deficiency 03/15/2016   Metabolic syndrome X 08/07/2013   Elevated random blood glucose level 08/07/2013   Hypertriglyceridemia 08/07/2013   ADD (attention deficit disorder) 07/24/2013    hyperlipidemia 10/09/2012   Morbid obesity (HCC) 10/09/2012   Hypothyroidism 10/09/2012   B12 deficiency 10/09/2012   Anemia, unspecified 02/12/2011   Dysthymic disorder 02/12/2011   Migraine headache 02/12/2011   Obsessive-compulsive disorder 02/12/2011   Tinnitus 02/12/2011    is allergic to phentermine , bee venom, lactose intolerance (gi), peanut allergen powder-dnfp, topiramate , sulfa antibiotics, tape, and wound dressing adhesive.  MEDICAL HISTORY: Past Medical History:  Diagnosis Date   Abdominal hernia    Anemia    b12 deficiency   Anxiety    Arthritis    B12 deficiency    Back pain    Cancer (HCC)    breast cancer   Depression    Family history of adverse reaction to anesthesia    mother vomits after anesthesia   Gallbladder problem    GERD (gastroesophageal reflux disease)    History of hiatal hernia    "small" per patient   Hypertension    Hypothyroidism    Joint pain     Knee pain    Palpitation    "when iron is low"   Pernicious anemia    SOB (shortness of breath)    Thyroid  disease    hypothyroidism   Vitamin D  deficiency     SURGICAL HISTORY: Past Surgical History:  Procedure Laterality Date   BREAST BIOPSY Left 06/09/2022   US  LT BREAST BX W LOC DEV 1ST LESION IMG BX SPEC US  GUIDE 06/09/2022 [facility] MAMMOGRAPHY   CESAREAN SECTION  1999   Dilatation and currettagement     for miscarriage 18 years ago   PORTACATH PLACEMENT Right 07/05/2022   Procedure: INSERTION PORT-A-CATH;  Surgeon: Sim Dryer, MD;  Location: [facility] OR;  Service: General;  Laterality: Right;   SIMPLE MASTECTOMY WITH AXILLARY SENTINEL NODE BIOPSY Bilateral 07/26/2023   Procedure: LEFT SKIN REDUCING MASTECTOMY, RIGHT SKIN SPARING, RISK REDUCING MASTECTOMY;  Surgeon: Sim Dryer, MD;  Location: [facility] OR;  Service: General;  Laterality: Bilateral;  PEC BLOCK    SOCIAL HISTORY: Social History   Socioeconomic History   Marital status: Married    Spouse name: Athena Bland   Number of children: Not on file   Years of education: Not on file   Highest education level: Not on file  Occupational History   Occupation: Adult Publishing copy  Tobacco Use   Smoking status: Never   Smokeless  tobacco: Never  Vaping Use   Vaping status: Never Used  Substance and Sexual Activity   Alcohol use: No   Drug use: No   Sexual activity: Yes    Birth control/protection: Pill  Other Topics Concern   Not on file  Social History Narrative   Not on file   Social Drivers of Health   Financial Resource Strain: Not on file  Food Insecurity: No Food Insecurity (08/29/2023)   Hunger Vital Sign    Worried About Running Out of Food in the Last Year: Never true    Ran Out of Food in the Last Year: Never true  Transportation Needs: No Transportation Needs (08/29/2023)   PRAPARE - Administrator, Civil Service (Medical): No    Lack of Transportation (Non-Medical): No  Physical Activity: Not on  file  Stress: Not on file  Social Connections: Moderately Integrated (07/26/2023)   Social Connection and Isolation Panel [NHANES]    Frequency of Communication with Friends and Family: Three times a week    Frequency of Social Gatherings with Friends and Family: Three times a week    Attends Religious Services: More than 4 times per year    Active Member of Clubs or Organizations: No    Attends Banker Meetings: Never    Marital Status: Married  Catering manager Violence: Not At Risk (08/29/2023)   Humiliation, Afraid, Rape, and Kick questionnaire    Fear of Current or Ex-Partner: No    Emotionally Abused: No    Physically Abused: No    Sexually Abused: No    FAMILY HISTORY: Family History  Problem Relation Age of Onset   Hypertension Mother    Diabetes Mother    High Cholesterol Mother    Thyroid  disease Mother    Depression Mother    Anxiety disorder Mother    Obesity Mother    CVA Father        hemorrhagic   Ovarian cancer Maternal Aunt 91   Colon cancer Maternal Aunt    Breast cancer Cousin 70       maternal first cousin, reports negative genetic testing    Review of Systems  Constitutional:  Negative for appetite change, chills, fatigue, fever and unexpected weight change.  HENT:   Negative for hearing loss, lump/mass and trouble swallowing.   Eyes:  Negative for eye problems and icterus.  Respiratory:  Negative for chest tightness, cough and shortness of breath.   Cardiovascular:  Negative for chest pain, leg swelling and palpitations.  Gastrointestinal:  Negative for abdominal distention, abdominal pain, constipation, diarrhea, nausea and vomiting.  Endocrine: Negative for hot flashes.  Genitourinary:  Negative for difficulty urinating.   Musculoskeletal:  Negative for arthralgias.  Skin:  Negative for itching and rash.  Neurological:  Positive for headaches. Negative for dizziness, extremity weakness and numbness.  Hematological:  Negative for  adenopathy. Does not bruise/bleed easily.  Psychiatric/Behavioral:  Negative for depression. The patient is not nervous/anxious.       PHYSICAL EXAMINATION    Vitals:   10/02/23 1233  BP: (!) 141/61  Pulse: 83  Resp: 18  Temp: 98 F (36.7 C)  SpO2: 100%    Physical Exam Constitutional:      General: She is not in acute distress.    Appearance: Normal appearance. She is not toxic-appearing.  HENT:     Head: Normocephalic and atraumatic.     Mouth/Throat:     Mouth: Mucous membranes are moist.  Pharynx: Oropharynx is clear. No oropharyngeal exudate or posterior oropharyngeal erythema.  Eyes:     General: No scleral icterus. Cardiovascular:     Rate and Rhythm: Normal rate and regular rhythm.     Pulses: Normal pulses.     Heart sounds: Normal heart sounds.  Pulmonary:     Effort: Pulmonary effort is normal.     Breath sounds: Normal breath sounds.  Abdominal:     General: Abdomen is flat. Bowel sounds are normal. There is no distension.     Palpations: Abdomen is soft.     Tenderness: There is no abdominal tenderness.  Musculoskeletal:        General: No swelling.     Cervical back: Neck supple.  Lymphadenopathy:     Cervical: No cervical adenopathy.  Skin:    General: Skin is warm and dry.     Findings: No rash.  Neurological:     General: No focal deficit present.     Mental Status: She is alert.  Psychiatric:        Mood and Affect: Mood normal.        Behavior: Behavior normal.     LABORATORY DATA:  CBC    Component Value Date/Time   WBC 6.5 10/02/2023 1215   WBC 8.1 08/25/2023 0519   RBC 3.36 (L) 10/02/2023 1215   HGB 8.9 (L) 10/02/2023 1215   HGB 12.0 07/21/2021 1511   HCT 28.6 (L) 10/02/2023 1215   HCT 37.6 07/21/2021 1511   PLT 255 10/02/2023 1215   PLT 359 07/21/2021 1511   MCV 85.1 10/02/2023 1215   MCV 76 (L) 07/21/2021 1511   MCH 26.5 10/02/2023 1215   MCHC 31.1 10/02/2023 1215   RDW 16.9 (H) 10/02/2023 1215   RDW 15.7 (H)  07/21/2021 1511   LYMPHSABS 2.0 10/02/2023 1215   LYMPHSABS 1.9 07/21/2021 1511   MONOABS 0.4 10/02/2023 1215   EOSABS 0.4 10/02/2023 1215   EOSABS 0.2 07/21/2021 1511   BASOSABS 0.1 10/02/2023 1215   BASOSABS 0.1 07/21/2021 1511    CMP     Component Value Date/Time   NA 142 10/02/2023 1351   NA 139 03/28/2022 0811   K 4.2 10/02/2023 1351   CL 107 10/02/2023 1351   CO2 29 10/02/2023 1351   GLUCOSE 101 (H) 10/02/2023 1351   BUN 11 10/02/2023 1351   BUN 14 03/28/2022 0811   CREATININE 0.64 10/02/2023 1351   CREATININE 0.74 10/09/2012 1537   CALCIUM 9.2 10/02/2023 1351   PROT 7.3 10/02/2023 1351   PROT 7.1 03/28/2022 0811   ALBUMIN 3.8 10/02/2023 1351   ALBUMIN 4.2 03/28/2022 0811   AST 23 10/02/2023 1351   ALT 12 10/02/2023 1351   ALKPHOS 102 10/02/2023 1351   BILITOT 0.3 10/02/2023 1351   GFRNONAA >60 10/02/2023 1351   GFRNONAA >89 10/09/2012 1537   GFRAA 96 05/18/2020 1442   GFRAA >89 10/09/2012 1537     ASSESSMENT and THERAPY PLAN:   Malignant neoplasm of upper-inner quadrant of left breast in female, estrogen receptor positive (HCC) Stage IV breast cancer to begin treatment on Kadcyla.   - Reviewed risks and benefits and patient verbalizes understanding. -CBC reviewed today and within parameters for her to receive treatment.  C-Met pending at time of appointment completion -Timotea will proceed with treatment today so long as c-Met is within parameters. -Referral placed to physical therapy to give [facility] instruction on range of motion exercises to prevent tightness in her shoulders. -Repeat echocardiogram ordered  to occur in [place].  I sent Dr. Julane Ny a staff message asking if he had a cardiologist recommendation in the [place] area.  Girlene is hoping to go on a vacation in [place] and is trying to build up her PTO in order to do so.  RTC in 3 weeks for labs, f/u with Dr. Arno Bibles, Kadcyla   All questions were answered. The patient knows to call the  clinic with any problems, questions or concerns. We can certainly see the patient much sooner if necessary.  Total encounter time:30 minutes*in face-to-face visit time, chart review, lab review, care coordination, order entry, and documentation of the encounter time.    Alwin Baars, NP 10/02/23 3:48 PM Medical Oncology and Hematology [facility] [address] [place], [place] [address] Tel. [phone]    Fax. [phone]  *Total Encounter Time as defined by the Centers for Medicare and Medicaid Services includes, in addition to the face-to-face time of a patient visit (documented in the note above) non-face-to-face time: obtaining and reviewing outside history, ordering and reviewing medications, tests or procedures, care coordination (communications with other health care professionals or caregivers) and documentation in the medical record.

## 2023-10-02 NOTE — Patient Instructions (Signed)
 [facility] - A DEPT OF . [facility]  Discharge Instructions: Thank you for choosing [facility] to provide your oncology and hematology care.   If you have a lab appointment with the Cancer Center, please go directly to the Cancer Center and check in at the registration area.   Wear comfortable clothing and clothing appropriate for easy access to any Portacath or PICC line.   We strive to give you quality time with your provider. You may need to reschedule your appointment if you arrive late (15 or more minutes).  Arriving late affects you and other patients whose appointments are after yours.  Also, if you miss three or more appointments without notifying the office, you may be dismissed from the clinic at the provider's discretion.      For prescription refill requests, have your pharmacy contact our office and allow 72 hours for refills to be completed.    Today you received the following chemotherapy and/or immunotherapy agents: Kadcyla      To help prevent nausea and vomiting after your treatment, we encourage you to take your nausea medication as directed.  BELOW ARE SYMPTOMS THAT SHOULD BE REPORTED IMMEDIATELY: *FEVER GREATER THAN 100.4 F (38 C) OR HIGHER *CHILLS OR SWEATING *NAUSEA AND VOMITING THAT IS NOT CONTROLLED WITH YOUR NAUSEA MEDICATION *UNUSUAL SHORTNESS OF BREATH *UNUSUAL BRUISING OR BLEEDING *URINARY PROBLEMS (pain or burning when urinating, or frequent urination) *BOWEL PROBLEMS (unusual diarrhea, constipation, pain near the anus) TENDERNESS IN MOUTH AND THROAT WITH OR WITHOUT PRESENCE OF ULCERS (sore throat, sores in mouth, or a toothache) UNUSUAL RASH, SWELLING OR PAIN  UNUSUAL VAGINAL DISCHARGE OR ITCHING   Items with * indicate a potential emergency and should be followed up as soon as possible or go to the Emergency Department if any problems should occur.  Please show the CHEMOTHERAPY ALERT CARD or IMMUNOTHERAPY  ALERT CARD at check-in to the Emergency Department and triage nurse.  Should you have questions after your visit or need to cancel or reschedule your appointment, please contact [facility] - A DEPT OF [facility]  Dept: [phone]  and follow the prompts.  Office hours are 8:00 a.m. to 4:30 p.m. Monday - Friday. Please note that voicemails left after 4:00 p.m. may not be returned until the following business day.  We are closed weekends and major holidays. You have access to a nurse at all times for urgent questions. Please call the main number to the clinic Dept: [phone] and follow the prompts.   For any non-urgent questions, you may also contact your provider using MyChart. We now offer e-Visits for anyone 41 and older to request care online for non-urgent symptoms. For details visit [email or web address].   Also download the MyChart app! Go to the app store, search "MyChart", open the app, select [place], and log in with your MyChart username and password.  Ado-Trastuzumab  Emtansine Injection What is this medication? ADO-TRASTUZUMAB  EMTANSINE (ADD oh traz TOO zuh mab em TAN zine) treats breast cancer. It works by blocking a protein that causes cancer cells to grow and multiply. This helps to slow or stop the spread of cancer cells. This medicine may be used for other purposes; ask your health care provider or pharmacist if you have questions. COMMON BRAND NAME(S): Kadcyla What should I tell my care team before I take this medication? They need to know if you have any of these conditions:  Heart failure Liver disease Low platelet levels Lung disease Tingling of the fingers or toes or other nerve disorder An unusual or allergic reaction to ado-trastuzumab  emtansine, other medications, foods, dyes, or preservatives Pregnant or trying to get pregnant Breast-feeding How should I use this medication? This medication is infused into a vein. It is  given by your care team in a hospital or clinic setting. Talk to your care team about the use of this medication in children. Special care may be needed. Overdosage: If you think you have taken too much of this medicine contact a poison control center or emergency room at once. NOTE: This medicine is only for you. Do not share this medicine with others. What if I miss a dose? Keep appointments for follow-up doses. It is important not to miss your dose. Call your care team if you are unable to keep an appointment. What may interact with this medication? Atazanavir Boceprevir Clarithromycin Dalfopristin; quinupristin Delavirdine Indinavir Isoniazid, INH Itraconazole Ketoconazole Nefazodone Nelfinavir Ritonavir  Telaprevir Telithromycin Tipranavir Voriconazole This list may not describe all possible interactions. Give your health care provider a list of all the medicines, herbs, non-prescription drugs, or dietary supplements you use. Also tell them if you smoke, drink alcohol, or use illegal drugs. Some items may interact with your medicine. What should I watch for while using this medication? This medication may make you feel generally unwell. This is not uncommon, as chemotherapy can affect healthy cells as well as cancer cells. Report any side effects. Continue your course of treatment even though you feel ill unless your care team tells you to stop. You may need blood work while taking this medication. This medication may increase your risk to bruise or bleed. Call your care team if you notice any unusual bleeding. Be careful brushing or flossing your teeth or using a toothpick because you may get an infection or bleed more easily. If you have any dental work done, tell your dentist you are receiving this medication. Talk to your care team if you may be pregnant. Serious birth defects can occur if you take this medication during pregnancy and for 7 months after the last dose. You will  need a negative pregnancy test before starting this medication. Contraception is recommended while taking this medication and for 7 months after the last dose. Your care team can help you find the option that works for you. If your partner can get pregnant, use a condom during sex while taking this medication and for 4 months after the last dose. Do not breastfeed while taking this medication and for 7 months after the last dose. This medication may cause infertility. Talk to your care team if you are concerned with your fertility. What side effects may I notice from receiving this medication? Side effects that you should report to your care team as soon as possible: Allergic reactions--skin rash, itching, hives, swelling of the face, lips, tongue, or throat Bleeding--bloody or black, tar-like stools, vomiting blood or brown material that looks like coffee grounds, red or dark brown urine, small red or purple spots on skin, unusual bruising or bleeding Dry cough, shortness of breath or trouble breathing Heart failure--shortness of breath, swelling of the ankles, feet, or hands, sudden weight gain, unusual weakness or fatigue Infusion reactions--chest pain, shortness of breath or trouble breathing, feeling faint or lightheaded Liver injury--right upper belly pain, loss of appetite, nausea, light-colored stool, dark yellow or brown urine, yellowing skin or eyes, unusual weakness or fatigue Pain, tingling, or  numbness in the hands or feet Painful swelling, warmth, or redness of the skin, blisters or sores at the infusion site Side effects that usually do not require medical attention (report to your care team if they continue or are bothersome): Constipation Fatigue Headache Muscle pain Nausea This list may not describe all possible side effects. Call your doctor for medical advice about side effects. You may report side effects to FDA at 1-800-FDA-1088. Where should I keep my medication? This  medication is given in a hospital or clinic. It will not be stored at home. NOTE: This sheet is a summary. It may not cover all possible information. If you have questions about this medicine, talk to your doctor, pharmacist, or health care provider.  2024 Elsevier/Gold Standard (2021-09-23 00:00:00)

## 2023-10-02 NOTE — Assessment & Plan Note (Signed)
 Stage IV breast cancer to begin treatment on Kadcyla.   - Reviewed risks and benefits and patient verbalizes understanding. -CBC reviewed today and within parameters for her to receive treatment.  C-Met pending at time of appointment completion -Audrina will proceed with treatment today so long as c-Met is within parameters. -Referral placed to physical therapy to give [facility] instruction on range of motion exercises to prevent tightness in her shoulders. -Repeat echocardiogram ordered to occur in [place].  I sent Dr. Julane Ny a staff message asking if he had a cardiologist recommendation in the [place] area.  Edlyn is hoping to go on a vacation in [place] and is trying to build up her PTO in order to do so.  RTC in 3 weeks for labs, f/u with Dr. Arno Bibles, Kadcyla

## 2023-10-02 NOTE — Progress Notes (Signed)
 Per Heidi Llamas, NP okay to pre-med patient while waiting for CMP to be resulted.

## 2023-10-05 ENCOUNTER — Other Ambulatory Visit: Payer: Self-pay

## 2023-10-05 ENCOUNTER — Encounter (HOSPITAL_COMMUNITY): Payer: Self-pay | Admitting: Emergency Medicine

## 2023-10-05 ENCOUNTER — Emergency Department (HOSPITAL_COMMUNITY)

## 2023-10-05 ENCOUNTER — Observation Stay (HOSPITAL_COMMUNITY)
Admission: EM | Admit: 2023-10-05 | Discharge: 2023-10-08 | Disposition: A | Discharge status: Home / Self Care | Attending: Internal Medicine | Admitting: Internal Medicine

## 2023-10-05 DIAGNOSIS — F32A Depression, unspecified: Secondary | ICD-10-CM | POA: Diagnosis present

## 2023-10-05 DIAGNOSIS — Z91010 Allergy to peanuts: Secondary | ICD-10-CM | POA: Insufficient documentation

## 2023-10-05 DIAGNOSIS — F319 Bipolar disorder, unspecified: Secondary | ICD-10-CM | POA: Diagnosis not present

## 2023-10-05 DIAGNOSIS — R509 Fever, unspecified: Secondary | ICD-10-CM | POA: Diagnosis present

## 2023-10-05 DIAGNOSIS — C50919 Malignant neoplasm of unspecified site of unspecified female breast: Secondary | ICD-10-CM | POA: Diagnosis not present

## 2023-10-05 DIAGNOSIS — D849 Immunodeficiency, unspecified: Secondary | ICD-10-CM | POA: Diagnosis not present

## 2023-10-05 DIAGNOSIS — A419 Sepsis, unspecified organism: Principal | ICD-10-CM | POA: Insufficient documentation

## 2023-10-05 DIAGNOSIS — K219 Gastro-esophageal reflux disease without esophagitis: Secondary | ICD-10-CM | POA: Insufficient documentation

## 2023-10-05 DIAGNOSIS — C50212 Malignant neoplasm of upper-inner quadrant of left female breast: Secondary | ICD-10-CM

## 2023-10-05 DIAGNOSIS — I10 Essential (primary) hypertension: Secondary | ICD-10-CM | POA: Insufficient documentation

## 2023-10-05 DIAGNOSIS — F419 Anxiety disorder, unspecified: Secondary | ICD-10-CM | POA: Diagnosis present

## 2023-10-05 DIAGNOSIS — Z79899 Other long term (current) drug therapy: Secondary | ICD-10-CM | POA: Insufficient documentation

## 2023-10-05 DIAGNOSIS — D649 Anemia, unspecified: Secondary | ICD-10-CM | POA: Insufficient documentation

## 2023-10-05 DIAGNOSIS — R6510 Systemic inflammatory response syndrome (SIRS) of non-infectious origin without acute organ dysfunction: Secondary | ICD-10-CM | POA: Diagnosis present

## 2023-10-05 LAB — CBC WITH DIFFERENTIAL/PLATELET
Abs Immature Granulocytes: 0.05 10*3/uL (ref 0.00–0.07)
Basophils Absolute: 0 10*3/uL (ref 0.0–0.1)
Basophils Relative: 1 %
Eosinophils Absolute: 0.2 10*3/uL (ref 0.0–0.5)
Eosinophils Relative: 3 %
HCT: 31.5 % — ABNORMAL LOW (ref 36.0–46.0)
Hemoglobin: 9.6 g/dL — ABNORMAL LOW (ref 12.0–15.0)
Immature Granulocytes: 1 %
Lymphocytes Relative: 16 %
Lymphs Abs: 1 10*3/uL (ref 0.7–4.0)
MCH: 26.9 pg (ref 26.0–34.0)
MCHC: 30.5 g/dL (ref 30.0–36.0)
MCV: 88.2 fL (ref 80.0–100.0)
Monocytes Absolute: 0.5 10*3/uL (ref 0.1–1.0)
Monocytes Relative: 8 %
Neutro Abs: 4.7 10*3/uL (ref 1.7–7.7)
Neutrophils Relative %: 71 %
Platelets: 161 10*3/uL (ref 150–400)
RBC: 3.57 MIL/uL — ABNORMAL LOW (ref 3.87–5.11)
RDW: 17 % — ABNORMAL HIGH (ref 11.5–15.5)
WBC: 6.5 10*3/uL (ref 4.0–10.5)
nRBC: 0 % (ref 0.0–0.2)

## 2023-10-05 LAB — COMPREHENSIVE METABOLIC PANEL WITH GFR
ALT: 21 U/L (ref 0–44)
AST: 54 U/L — ABNORMAL HIGH (ref 15–41)
Albumin: 3.3 g/dL — ABNORMAL LOW (ref 3.5–5.0)
Alkaline Phosphatase: 116 U/L (ref 38–126)
Anion gap: 10 (ref 5–15)
BUN: 9 mg/dL (ref 6–20)
CO2: 23 mmol/L (ref 22–32)
Calcium: 9.1 mg/dL (ref 8.9–10.3)
Chloride: 103 mmol/L (ref 98–111)
Creatinine, Ser: 0.75 mg/dL (ref 0.44–1.00)
GFR, Estimated: >60 mL/min (ref >60)
Glucose, Bld: 118 mg/dL — ABNORMAL HIGH (ref 70–99)
Potassium: 3.6 mmol/L (ref 3.5–5.1)
Sodium: 136 mmol/L (ref 135–145)
Total Bilirubin: 0.7 mg/dL (ref 0.0–1.2)
Total Protein: 7.3 g/dL (ref 6.5–8.1)

## 2023-10-05 LAB — URINALYSIS, W/ REFLEX TO CULTURE (INFECTION SUSPECTED)
Bilirubin Urine: NEGATIVE
Glucose, UA: NEGATIVE mg/dL
Hgb urine dipstick: NEGATIVE
Ketones, ur: NEGATIVE mg/dL
Nitrite: NEGATIVE
Protein, ur: NEGATIVE mg/dL
Specific Gravity, Urine: 1.011 (ref 1.005–1.030)
pH: 7 (ref 5.0–8.0)

## 2023-10-05 LAB — RESP PANEL BY RT-PCR (RSV, FLU A&B, COVID)  RVPGX2
Influenza A by PCR: NEGATIVE
Influenza B by PCR: NEGATIVE
Resp Syncytial Virus by PCR: NEGATIVE
SARS Coronavirus 2 by RT PCR: NEGATIVE

## 2023-10-05 LAB — PROTIME-INR
INR: 1 (ref 0.8–1.2)
Prothrombin Time: 13.5 s (ref 11.4–15.2)

## 2023-10-05 LAB — LIPASE, BLOOD: Lipase: 30 U/L (ref 11–51)

## 2023-10-05 LAB — I-STAT CG4 LACTIC ACID, ED: Lactic Acid, Venous: 1.3 mmol/L (ref 0.5–1.9)

## 2023-10-05 MED ORDER — LACTATED RINGERS IV BOLUS (SEPSIS)
1000.0000 mL | Freq: Once | INTRAVENOUS | Status: AC
  Administered 2023-10-05: 1000 mL via INTRAVENOUS

## 2023-10-05 MED ORDER — LACTATED RINGERS IV SOLN: INTRAVENOUS | Status: DC

## 2023-10-05 MED ORDER — VANCOMYCIN HCL IN DEXTROSE 1-5 GM/200ML-% IV SOLN
1000.0000 mg | Freq: Once | INTRAVENOUS | Status: AC
  Administered 2023-10-05: 1000 mg via INTRAVENOUS
  Filled 2023-10-05: qty 200

## 2023-10-05 MED ORDER — SODIUM CHLORIDE 0.9 % IV SOLN
2.0000 g | Freq: Once | INTRAVENOUS | Status: AC
  Administered 2023-10-05: 2 g via INTRAVENOUS
  Filled 2023-10-05: qty 12.5

## 2023-10-05 MED ORDER — METRONIDAZOLE 500 MG/100ML IV SOLN
500.0000 mg | Freq: Once | INTRAVENOUS | Status: AC
  Administered 2023-10-06: 500 mg via INTRAVENOUS
  Filled 2023-10-05: qty 100

## 2023-10-05 MED ORDER — IOHEXOL 350 MG/ML SOLN
100.0000 mL | Freq: Once | INTRAVENOUS | Status: AC | PRN
  Administered 2023-10-05: 100 mL via INTRAVENOUS

## 2023-10-05 MED ORDER — ACETAMINOPHEN 325 MG PO TABS
650.0000 mg | ORAL_TABLET | Freq: Once | ORAL | Status: AC
  Administered 2023-10-06: 650 mg via ORAL
  Filled 2023-10-05: qty 2

## 2023-10-05 NOTE — ED Provider Notes (Signed)
 [place] EMERGENCY DEPARTMENT AT [facility] Provider Note   CSN: [id number] Arrival date & time: 10/05/23  2129    History  Chief Complaint  Patient presents with   Fever    Diamond Marshall is a 53 y.o. female here for evaluation of fever.  History of breast cancer multiple myeloma.  Currently getting Kadcyla .  Received on 10/02/2014.  She did recently sepsis last month due to cellulitis of her right breast wound.  Patient thinks the wound has been healing well does still have some yellow drainage however is overall improved.  She notes today she started having chills, myalgias and "bone pain."  She did have a fever up to 101 at home.  Has a little bit of a cough however states she thought it was due to allergies.  No headache, neck stiffness, back pain, abd pain, vomiting, dysuria hematuria.  Some nausea.  Patient's right breast wound did grow out MRSA on prior admission culture from 4/4  HPI     Home Medications Prior to Admission medications   Medication Sig Start Date End Date Taking? Authorizing Provider  acetaminophen  (TYLENOL ) 325 MG tablet Take 2 tablets (650 mg total) by mouth every 6 (six) hours as needed for mild pain (pain score 1-3) (or Fever >/= 101). 07/27/23   Cornett, Andy Bannister, MD  budesonide  (ENTOCORT EC ) 3 MG 24 hr capsule TAKE THREE CAPSULES BY MOUTH DAILY Patient not taking: Reported on 08/23/2023 06/13/23   Iruku, Praveena, MD  carvedilol  (COREG ) 3.125 MG tablet TAKE ONE TABLET TWICE DAILY WITH MEAL(S) 09/17/23   Iruku, Praveena, MD  colestipol  (COLESTID ) 1 g tablet Take 2 tablets (2 g total) by mouth 2 (two) times daily. Patient not taking: Reported on 08/23/2023 07/06/23   Graciella Lavender, PA  cyanocobalamin  (VITAMIN B12) 1000 MCG/ML injection INJECT 1 ML INTRAMUSCULARLY EVERY 15 DAYS Patient taking differently: Inject 1,000 mcg into the skin every 30 (thirty) days. 05/18/23   Gudena, Vinay, MD  cycloSPORINE  (RESTASIS ) 0.05 % ophthalmic emulsion Place 1  drop into both eyes 2 (two) times daily. 06/15/23   Iruku, Praveena, MD  diphenoxylate -atropine  (LOMOTIL ) 2.5-0.025 MG tablet TAKE TWO TABLETS EVERY 6 HOURS AS NEEDED FOR DIARRHEA Patient not taking: Reported on 08/29/2023 04/17/23   Murleen Arms, MD  EPINEPHrine  0.3 mg/0.3 mL IJ SOAJ injection Inject 0.3 mg into the muscle as needed for anaphylaxis. 12/16/21   Zorita Hiss, FNP  escitalopram  (LEXAPRO ) 20 MG tablet Take 1 tablet (20 mg total) by mouth daily. Please take citalopram 10 mg( half pill) only for next 3 days starting from tomorrow.  Then stop it until you finish the course of linezolid .  Can resume previous dose after  2 days of finishing linezolid  08/26/23   Leona Rake, MD  fluconazole  (DIFLUCAN ) 100 MG tablet Take 2 tablets (200 mg total) by mouth daily. 08/30/23   Iruku, Praveena, MD  hydrocortisone  (ANUSOL -HC) 2.5 % rectal cream Place 1 Application rectally 2 (two) times daily. Patient not taking: Reported on 08/23/2023 01/09/23   Coretha Dew, PA-C  LORazepam  (ATIVAN ) 0.5 MG tablet Take 1 tablet (0.5 mg total) by mouth every 8 (eight) hours as needed for anxiety. 06/22/22   Iruku, Praveena, MD  methocarbamol  (ROBAXIN ) 500 MG tablet Take 1 tablet (500 mg total) by mouth every 6 (six) hours as needed for muscle spasms. Patient not taking: Reported on 08/23/2023 07/27/23   Sim Dryer, MD  oxyCODONE  (OXY IR/ROXICODONE ) 5 MG immediate release tablet Take 1 tablet (5  mg total) by mouth every 6 (six) hours as needed for severe pain (pain score 7-10). 07/27/23   Cornett, Andy Bannister, MD  pantoprazole  (PROTONIX ) 40 MG tablet Take 1 tablet (40 mg total) by mouth daily. Patient taking differently: Take 40 mg by mouth See admin instructions. Take 40 mg by mouth in the morning 30 minutes before breakfast and an additional 40 mg in the evening as needed for unresolved acid reflux symptoms 08/06/23   Roise Cleaver, MD  potassium chloride  (KLOR-CON ) 10 MEQ tablet TAKE TWO TABLETS DAILY AS DIRECTED 09/17/23    Iruku, Praveena, MD      Allergies    Phentermine , Bee venom, Lactose intolerance (gi), Peanut allergen powder-dnfp, Topiramate , Sulfa antibiotics, Tape, and Wound dressing adhesive    Review of Systems   Review of Systems  Constitutional:  Positive for fever. Negative for activity change, appetite change, chills and fatigue.  HENT: Negative.    Respiratory:  Positive for cough. Negative for apnea, choking, chest tightness, shortness of breath, wheezing and stridor.   Cardiovascular: Negative.   Gastrointestinal:  Positive for nausea. Negative for abdominal pain, constipation and diarrhea.  Genitourinary: Negative.   Musculoskeletal:  Positive for myalgias. Negative for back pain, neck pain and neck stiffness.  Skin: Negative.   Neurological: Negative.   All other systems reviewed and are negative.   Physical Exam Updated Vital Signs BP (!) 148/99 (BP Location: Left Arm)   Pulse 91   Temp (!) 101.6 F (38.7 C) (Oral)   Resp 16   Ht 5\' 1"  (1.549 m)   Wt 125 kg   LMP 10/13/2019 (Approximate)   SpO2 100%   BMI 52.07 kg/m  Physical Exam Vitals and nursing note reviewed.  Constitutional:      General: She is not in acute distress.    Appearance: She is well-developed. She is not ill-appearing, toxic-appearing or diaphoretic.  HENT:     Head: Normocephalic and atraumatic.     Nose: Nose normal.     Mouth/Throat:     Mouth: Mucous membranes are moist.  Eyes:     Pupils: Pupils are equal, round, and reactive to light.  Cardiovascular:     Rate and Rhythm: Normal rate.     Pulses: Normal pulses.          Radial pulses are 2+ on the right side and 2+ on the left side.       Dorsalis pedis pulses are 2+ on the right side and 2+ on the left side.     Heart sounds: Normal heart sounds.  Pulmonary:     Effort: Pulmonary effort is normal. No respiratory distress.     Breath sounds: Normal breath sounds.  Chest:     Comments: Bilateral mastectomy scars right with scant  erythema and scant purulent drainage from wound Abdominal:     General: Bowel sounds are normal. There is no distension.     Palpations: Abdomen is soft.     Tenderness: There is no abdominal tenderness. There is no right CVA tenderness or left CVA tenderness.  Musculoskeletal:        General: No swelling, tenderness, deformity or signs of injury. Normal range of motion.     Cervical back: Normal range of motion.     Right lower leg: No edema.     Left lower leg: No edema.  Skin:    General: Skin is warm and dry.     Capillary Refill: Capillary refill takes less than 2 seconds.  Neurological:     General: No focal deficit present.     Mental Status: She is alert.     Cranial Nerves: No cranial nerve deficit.     Motor: No weakness.     Gait: Gait normal.  Psychiatric:        Mood and Affect: Mood normal.     ED Results / Procedures / Treatments   Labs (all labs ordered are listed, but only abnormal results are displayed) Labs Reviewed  COMPREHENSIVE METABOLIC PANEL WITH GFR - Abnormal; Notable for the following components:      Result Value   Glucose, Bld 118 (*)    Albumin 3.3 (*)    AST 54 (*)    All other components within normal limits  CBC WITH DIFFERENTIAL/PLATELET - Abnormal; Notable for the following components:   RBC 3.57 (*)    Hemoglobin 9.6 (*)    HCT 31.5 (*)    RDW 17.0 (*)    All other components within normal limits  RESP PANEL BY RT-PCR (RSV, FLU A&B, COVID)  RVPGX2  CULTURE, BLOOD (ROUTINE X 2)  CULTURE, BLOOD (ROUTINE X 2)  LIPASE, BLOOD  PROTIME-INR  URINALYSIS, W/ REFLEX TO CULTURE (INFECTION SUSPECTED)  I-STAT CG4 LACTIC ACID, ED  I-STAT CG4 LACTIC ACID, ED    EKG None  Radiology DG Chest 2 View Result Date: 10/05/2023 CLINICAL DATA:  Sepsis EXAM: CHEST - 2 VIEW COMPARISON:  None Available. FINDINGS: Lungs are clear. No pneumothorax or pleural effusion. Right internal jugular chest port tip seen within the superior cavoatrial junction.  Cardiac size within normal limits. Or vascularity is normal. No acute bone abnormality. Surgical clips noted within the right breast. IMPRESSION: 1. No active cardiopulmonary disease. Electronically Signed   By: Worthy Heads M.D.   On: 10/05/2023 21:58    Procedures .Critical Care  Performed by: Dickson Founds, PA-C Authorized by: Dickson Founds, PA-C   Critical care provider statement:    Critical care time (minutes):  32   Critical care was time spent personally by me on the following activities:  Development of treatment plan with patient or surrogate, discussions with consultants, evaluation of patient's response to treatment, examination of patient, ordering and review of laboratory studies, ordering and review of radiographic studies, ordering and performing treatments and interventions, pulse oximetry, re-evaluation of patient's condition and review of old charts     Medications Ordered in ED Medications  lactated ringers  infusion (has no administration in time range)  metroNIDAZOLE  (FLAGYL ) IVPB 500 mg (has no administration in time range)  vancomycin  (VANCOCIN ) IVPB 1000 mg/200 mL premix (1,000 mg Intravenous New Bag/Given 10/05/23 2256)  acetaminophen  (TYLENOL ) tablet 650 mg (has no administration in time range)  lactated ringers  bolus 1,000 mL (1,000 mLs Intravenous New Bag/Given 10/05/23 2248)  ceFEPIme  (MAXIPIME ) 2 g in sodium chloride  0.9 % 100 mL IVPB (2 g Intravenous New Bag/Given 10/05/23 2258)  iohexol  (OMNIPAQUE ) 350 MG/ML injection 100 mL (100 mLs Intravenous Contrast Given 10/05/23 2340)   ED Course/ Medical Decision Making/ A&P   53 year old history of breast cancer recent bilateral mastectomy in March here for evaluation of fever.  Received her cancer treatment 3 days ago subsequently developed fever today.  She does have a mild cough however states she thinks this is related to allergies.  The cellulitis that she was previously admitted for about a month ago  has improved she does still have some scant drainage from her right breast mastectomy scar.  Her abdomen is  soft, nontender, no urinary symptoms.  No neck stiffness or headache to suggest meningitis.  She is febrile, mildly tachycardic here into the low 100 time in the room.  Will plan on labs, imaging, code sepsis called IV antibiotics given immunocompromise state with recent cancer treatment  Labs and imaging personally viewed and interpreted:  CBC without leukocytosis, hemoglobin 9.6 similar to baseline Lactic 1.3 Lipase 30 Metabolic panel without significant abnormality Chest x-ray without significant abnormality Viral panel neg  No evidence of neutropenic fever based off labs.   Care transferred to [facility] who will FU on labs, imaging and dispo                                Medical Decision Making Amount and/or Complexity of Data Reviewed Independent Historian: friend External Data Reviewed: labs, radiology, ECG and notes. Labs: ordered. Decision-making details documented in ED Course. Radiology: ordered and independent interpretation performed. Decision-making details documented in ED Course. ECG/medicine tests: ordered and independent interpretation performed. Decision-making details documented in ED Course.  Risk OTC drugs. Prescription drug management. Parenteral controlled substances. Decision regarding hospitalization. Diagnosis or treatment significantly limited by social determinants of health.         Final Clinical Impression(s) / ED Diagnoses Final diagnoses:  Sepsis without acute organ dysfunction, due to unspecified organism [facility])  Immunocompromised state [facility])    Rx / DC Orders ED Discharge Orders     None         Tarius Stangelo A, PA-C 10/05/23 2350    Arvilla Birmingham, MD 10/06/23 1057

## 2023-10-05 NOTE — ED Triage Notes (Signed)
 Patient c/o fever and chills tonight. Patient report Nausea denies vomiting. Patient report last chemo was last Tuesday. Hx Breast Ca.

## 2023-10-05 NOTE — Progress Notes (Addendum)
 Elink monitoring for the code sepsis protocol.  2349 verified with bedside RN that Blood Culture was drawn prior to starting the Antibiotic.

## 2023-10-06 ENCOUNTER — Encounter (HOSPITAL_COMMUNITY): Payer: Self-pay | Admitting: Family Medicine

## 2023-10-06 DIAGNOSIS — Z170 Estrogen receptor positive status [ER+]: Secondary | ICD-10-CM

## 2023-10-06 DIAGNOSIS — C50212 Malignant neoplasm of upper-inner quadrant of left female breast: Secondary | ICD-10-CM | POA: Diagnosis not present

## 2023-10-06 DIAGNOSIS — R6510 Systemic inflammatory response syndrome (SIRS) of non-infectious origin without acute organ dysfunction: Secondary | ICD-10-CM

## 2023-10-06 DIAGNOSIS — F419 Anxiety disorder, unspecified: Secondary | ICD-10-CM | POA: Diagnosis not present

## 2023-10-06 DIAGNOSIS — F32A Depression, unspecified: Secondary | ICD-10-CM | POA: Diagnosis not present

## 2023-10-06 LAB — HEPATIC FUNCTION PANEL
ALT: 20 U/L (ref 0–44)
AST: 45 U/L — ABNORMAL HIGH (ref 15–41)
Albumin: 3 g/dL — ABNORMAL LOW (ref 3.5–5.0)
Alkaline Phosphatase: 109 U/L (ref 38–126)
Bilirubin, Direct: <0.1 mg/dL (ref 0.0–0.2)
Total Bilirubin: 0.6 mg/dL (ref 0.0–1.2)
Total Protein: 6.5 g/dL (ref 6.5–8.1)

## 2023-10-06 LAB — HIV ANTIBODY (ROUTINE TESTING W REFLEX): HIV Screen 4th Generation wRfx: NONREACTIVE

## 2023-10-06 LAB — CBC
HCT: 27.8 % — ABNORMAL LOW (ref 36.0–46.0)
Hemoglobin: 8.4 g/dL — ABNORMAL LOW (ref 12.0–15.0)
MCH: 26.3 pg (ref 26.0–34.0)
MCHC: 30.2 g/dL (ref 30.0–36.0)
MCV: 87.1 fL (ref 80.0–100.0)
Platelets: 120 10*3/uL — ABNORMAL LOW (ref 150–400)
RBC: 3.19 MIL/uL — ABNORMAL LOW (ref 3.87–5.11)
RDW: 17.1 % — ABNORMAL HIGH (ref 11.5–15.5)
WBC: 4.2 10*3/uL (ref 4.0–10.5)
nRBC: 0 % (ref 0.0–0.2)

## 2023-10-06 LAB — BASIC METABOLIC PANEL WITH GFR
Anion gap: 13 (ref 5–15)
BUN: 7 mg/dL (ref 6–20)
CO2: 23 mmol/L (ref 22–32)
Calcium: 9.1 mg/dL (ref 8.9–10.3)
Chloride: 102 mmol/L (ref 98–111)
Creatinine, Ser: 0.64 mg/dL (ref 0.44–1.00)
GFR, Estimated: >60 mL/min (ref >60)
Glucose, Bld: 88 mg/dL (ref 70–99)
Potassium: 3.4 mmol/L — ABNORMAL LOW (ref 3.5–5.1)
Sodium: 138 mmol/L (ref 135–145)

## 2023-10-06 LAB — MAGNESIUM: Magnesium: 1.9 mg/dL (ref 1.7–2.4)

## 2023-10-06 LAB — MRSA NEXT GEN BY PCR, NASAL: MRSA by PCR Next Gen: NOT DETECTED

## 2023-10-06 LAB — I-STAT CG4 LACTIC ACID, ED: Lactic Acid, Venous: 1 mmol/L (ref 0.5–1.9)

## 2023-10-06 MED ORDER — ESCITALOPRAM OXALATE 20 MG PO TABS
20.0000 mg | ORAL_TABLET | Freq: Every day | ORAL | Status: DC
  Administered 2023-10-06 – 2023-10-08 (×3): 20 mg via ORAL
  Filled 2023-10-06 (×3): qty 1

## 2023-10-06 MED ORDER — CARVEDILOL 3.125 MG PO TABS
3.1250 mg | ORAL_TABLET | Freq: Two times a day (BID) | ORAL | Status: DC
  Administered 2023-10-06 – 2023-10-08 (×5): 3.125 mg via ORAL
  Filled 2023-10-06 (×5): qty 1

## 2023-10-06 MED ORDER — HYDROMORPHONE HCL 1 MG/ML IJ SOLN: 0.5000 mg | INTRAMUSCULAR | Status: DC | PRN

## 2023-10-06 MED ORDER — ACETAMINOPHEN 325 MG PO TABS
650.0000 mg | ORAL_TABLET | Freq: Four times a day (QID) | ORAL | Status: DC | PRN
  Administered 2023-10-06 – 2023-10-07 (×5): 650 mg via ORAL
  Filled 2023-10-06 (×5): qty 2

## 2023-10-06 MED ORDER — OXYCODONE HCL 5 MG PO TABS
5.0000 mg | ORAL_TABLET | ORAL | Status: DC | PRN
  Administered 2023-10-06 – 2023-10-07 (×2): 5 mg via ORAL
  Filled 2023-10-06 (×2): qty 1

## 2023-10-06 MED ORDER — ENOXAPARIN SODIUM 40 MG/0.4ML IJ SOSY
40.0000 mg | PREFILLED_SYRINGE | INTRAMUSCULAR | Status: DC
Start: 2023-10-06 — End: 2023-10-08
  Administered 2023-10-06 – 2023-10-07 (×2): 40 mg via SUBCUTANEOUS
  Filled 2023-10-06 (×2): qty 0.4

## 2023-10-06 MED ORDER — POLYETHYLENE GLYCOL 3350 17 G PO PACK: 17.0000 g | PACK | Freq: Every day | ORAL | Status: DC | PRN

## 2023-10-06 MED ORDER — ONDANSETRON HCL 4 MG PO TABS: 4.0000 mg | ORAL_TABLET | Freq: Four times a day (QID) | ORAL | Status: DC | PRN

## 2023-10-06 MED ORDER — METRONIDAZOLE 500 MG/100ML IV SOLN
500.0000 mg | Freq: Two times a day (BID) | INTRAVENOUS | Status: DC
  Administered 2023-10-06 – 2023-10-08 (×4): 500 mg via INTRAVENOUS
  Filled 2023-10-06 (×4): qty 100

## 2023-10-06 MED ORDER — ONDANSETRON HCL 4 MG/2ML IJ SOLN: 4.0000 mg | Freq: Four times a day (QID) | INTRAMUSCULAR | Status: DC | PRN

## 2023-10-06 MED ORDER — SODIUM CHLORIDE 0.9 % IV SOLN
2.0000 g | Freq: Three times a day (TID) | INTRAVENOUS | Status: DC
  Administered 2023-10-06 – 2023-10-08 (×7): 2 g via INTRAVENOUS
  Filled 2023-10-06 (×7): qty 12.5

## 2023-10-06 MED ORDER — ACETAMINOPHEN 650 MG RE SUPP: 650.0000 mg | Freq: Four times a day (QID) | RECTAL | Status: DC | PRN

## 2023-10-06 MED ORDER — VANCOMYCIN HCL 1750 MG/350ML IV SOLN
1750.0000 mg | INTRAVENOUS | Status: DC
  Administered 2023-10-06 – 2023-10-08 (×3): 1750 mg via INTRAVENOUS
  Filled 2023-10-06 (×3): qty 350

## 2023-10-06 NOTE — Progress Notes (Signed)
 Pharmacy Antibiotic Note  Diamond Marshall is a 53 y.o. female admitted on 10/05/2023 with fever, chills, aches and nausea, pt is status postmastectomy currently undergoing treatment with Kadcyla  .  Pharmacy has been consulted for vancomycin  dosing.  Plan: Vancomycin  1750mg  IV q24h (AUC 500.7, Scr 0.8) Cefepime  2gm IV q8h Follow renal function, cultures and clinical course  Height: 5\' 1"  (154.9 cm) Weight: 125 kg (275 lb 9.2 oz) IBW/kg (Calculated) : 47.8  Temp (24hrs), Avg:100.5 F (38.1 C), Min:99.4 F (37.4 C), Max:101.6 F (38.7 C)  Recent Labs  Lab 10/02/23 1215 10/02/23 1351 10/05/23 2158 10/05/23 2200 10/06/23 0033  WBC 6.5  --  6.5  --   --   CREATININE  --  0.64 0.75  --   --   LATICACIDVEN  --   --   --  1.3 1.0    Estimated Creatinine Clearance: 102.2 mL/min (by C-G formula based on SCr of 0.75 mg/dL).    Allergies  Allergen Reactions   Phentermine  Other (See Comments)    Became suicidal on the medication   Bee Venom Swelling and Other (See Comments)    Severe swelling where stung   Lactose Intolerance (Gi) Diarrhea   Peanut Allergen Powder-Dnfp Other (See Comments)    Migraines   Topiramate  Other (See Comments)    Dizziness    Sulfa Antibiotics Itching and Rash   Tape Rash   Wound Dressing Adhesive Rash    Antimicrobials this admission: 5/16 cefepime  >> 5/16 vanc >>  Dose adjustments this admission:   Microbiology results: 5/16 BCx:   Thank you for allowing pharmacy to be a part of this patient's care.  Beau Bound RPh 10/06/2023, 2:55 AM

## 2023-10-06 NOTE — Progress Notes (Signed)
 MEWS Progress Note  Patient Details Name: Diamond Marshall MRN: [id number] DOB: [birth year] Today's Date: 10/06/2023   MEWS Flowsheet Documentation:  Assess: MEWS Score Temp: (!) 102.4 F (39.1 C) (MD notified) BP: 132/75 MAP (mmHg): 91 Pulse Rate: 95 ECG Heart Rate: 84 Resp: 16 Level of Consciousness: Alert SpO2: 92 % O2 Device: Room Air Assess: MEWS Score MEWS Temp: 2 MEWS Systolic: 0 MEWS Pulse: 0 MEWS RR: 0 MEWS LOC: 0 MEWS Score: 2 MEWS Score Color: Yellow Assess: SIRS CRITERIA SIRS Temperature : 1 SIRS Respirations : 0 SIRS Pulse: 1 SIRS WBC: 0 SIRS Score Sum : 2 SIRS Temperature : 1 SIRS Pulse: 1 SIRS Respirations : 0 SIRS WBC: 0 SIRS Score Sum : 2 Assess: if the MEWS score is Yellow or Red Were vital signs accurate and taken at a resting state?: Yes Does the patient meet 2 or more of the SIRS criteria?: Yes Does the patient have a confirmed or suspected source of infection?: Yes MEWS guidelines implemented : Yes, yellow Treat MEWS Interventions: Considered administering scheduled or prn medications/treatments as ordered Take Vital Signs Increase Vital Sign Frequency : Yellow: Q2hr x1, continue Q4hrs until patient remains green for 12hrs Escalate MEWS: Escalate: Yellow: Discuss with charge nurse and consider notifying provider and/or RRT Notify: Charge Nurse/RN Name of Charge Nurse/RN Notified: Ava P, RN Provider Notification Provider Name/Title: Hoyt Macleod, MD Date Provider Notified: 10/06/23 Time Provider Notified: 1545 Method of Notification: Page Notification Reason: Change in status Provider response: No new orders (ice packs adds) Date of Provider Response: 10/06/23 Time of Provider Response: 1547 Notify: Rapid Response Name of Rapid Response RN Notified: Irby Mannan, RN Date Rapid Response Notified: 10/06/23 Time Rapid Response Notified: 1553      Novella Abraha L Franceen Erisman 10/06/2023, 3:56 PM

## 2023-10-06 NOTE — H&P (Signed)
 History and Physical    Diamond Marshall WUJ:[id number] DOB: 03/26/1971 DOA: 10/05/2023  PCP: Roise Cleaver, MD   Patient coming from: Home   Chief Complaint: Fever, chills, aches, nausea  HPI: Diamond Marshall is a 53 y.o. female with medical history significant for depression, anxiety, hypertension, and breast cancer status postmastectomy currently undergoing treatment with Kadcyla  who presents with fevers, chills, aches, and nausea.  The patient was treated with Kadcyla  on 10/02/2023. She has been experiencing generalized aches that she attributes to the infusion but then developed chills yesterday that worsened throughout the day. She documented a fever at home last night. She had transient nausea but denies upper or lower respiratory symptoms, abdominal pain, vomiting, diarrhea, dysuria, rash or wounds aside from the healing mastectomy wounds. There has been scant drainage from the right breast wound but no tenderness or significant surrounding erythema.   ED Course: Upon arrival to the ED, patient is found to be febrile to 38.7 C and saturating well on room air with transient tachycardia and stable BP.  Labs are most notable for normal creatinine, normal WBC, normal lactic acid, and negative respiratory virus panel.  No acute findings are noted on CTA chest or CT of the abdomen and pelvis.  Blood cultures collected in the ED and the patient was given a liter of LR, acetaminophen , cefepime , vancomycin , and Flagyl .  Review of Systems:  All other systems reviewed and apart from HPI, are negative.  Past Medical History:  Diagnosis Date   Abdominal hernia    Anemia    b12 deficiency   Anxiety    Arthritis    B12 deficiency    Back pain    Cancer (HCC)    breast cancer   Depression    Family history of adverse reaction to anesthesia    mother vomits after anesthesia   Gallbladder problem    GERD (gastroesophageal reflux disease)    History of hiatal hernia    "small" per patient    Hypertension    Hypothyroidism    Joint pain    Knee pain    Palpitation    "when iron is low"   Pernicious anemia    SOB (shortness of breath)    Thyroid  disease    hypothyroidism   Vitamin D  deficiency     Past Surgical History:  Procedure Laterality Date   BREAST BIOPSY Left 06/09/2022   US  LT BREAST BX W LOC DEV 1ST LESION IMG BX SPEC US  GUIDE 06/09/2022 [facility]   CESAREAN SECTION  1999   Dilatation and currettagement     for miscarriage 18 years ago   PORTACATH PLACEMENT Right 07/05/2022   Procedure: INSERTION PORT-A-CATH;  Surgeon: Sim Dryer, MD;  Location: [facility] OR;  Service: General;  Laterality: Right;   SIMPLE MASTECTOMY WITH AXILLARY SENTINEL NODE BIOPSY Bilateral 07/26/2023   Procedure: LEFT SKIN REDUCING MASTECTOMY, RIGHT SKIN SPARING, RISK REDUCING MASTECTOMY;  Surgeon: Sim Dryer, MD;  Location: [facility] OR;  Service: General;  Laterality: Bilateral;  PEC BLOCK    Social History:   reports that she has never smoked. She has never used smokeless tobacco. She reports that she does not drink alcohol and does not use drugs.  Allergies  Allergen Reactions   Phentermine  Other (See Comments)    Became suicidal on the medication   Bee Venom Swelling and Other (See Comments)    Severe swelling where stung   Lactose Intolerance (Gi) Diarrhea   Peanut Allergen Powder-Dnfp Other (See Comments)  Migraines   Topiramate  Other (See Comments)    Dizziness    Sulfa Antibiotics Itching and Rash   Tape Rash   Wound Dressing Adhesive Rash    Family History  Problem Relation Age of Onset   Hypertension Mother    Diabetes Mother    High Cholesterol Mother    Thyroid  disease Mother    Depression Mother    Anxiety disorder Mother    Obesity Mother    CVA Father        hemorrhagic   Ovarian cancer Maternal Aunt 29   Colon cancer Maternal Aunt    Breast cancer Cousin 76       maternal first cousin, reports negative genetic testing     Prior to Admission  medications   Medication Sig Start Date End Date Taking? Authorizing Provider  acetaminophen  (TYLENOL ) 325 MG tablet Take 2 tablets (650 mg total) by mouth every 6 (six) hours as needed for mild pain (pain score 1-3) (or Fever >/= 101). 07/27/23   Cornett, Andy Bannister, MD  budesonide  (ENTOCORT EC ) 3 MG 24 hr capsule TAKE THREE CAPSULES BY MOUTH DAILY Patient not taking: Reported on 08/23/2023 06/13/23   Iruku, Praveena, MD  carvedilol  (COREG ) 3.125 MG tablet TAKE ONE TABLET TWICE DAILY WITH MEAL(S) 09/17/23   Iruku, Praveena, MD  colestipol  (COLESTID ) 1 g tablet Take 2 tablets (2 g total) by mouth 2 (two) times daily. Patient not taking: Reported on 08/23/2023 07/06/23   Graciella Lavender, PA  cyanocobalamin  (VITAMIN B12) 1000 MCG/ML injection INJECT 1 ML INTRAMUSCULARLY EVERY 15 DAYS Patient taking differently: Inject 1,000 mcg into the skin every 30 (thirty) days. 05/18/23   Gudena, Vinay, MD  cycloSPORINE  (RESTASIS ) 0.05 % ophthalmic emulsion Place 1 drop into both eyes 2 (two) times daily. 06/15/23   Iruku, Praveena, MD  diphenoxylate -atropine  (LOMOTIL ) 2.5-0.025 MG tablet TAKE TWO TABLETS EVERY 6 HOURS AS NEEDED FOR DIARRHEA Patient not taking: Reported on 08/29/2023 04/17/23   Murleen Arms, MD  EPINEPHrine  0.3 mg/0.3 mL IJ SOAJ injection Inject 0.3 mg into the muscle as needed for anaphylaxis. 12/16/21   Zorita Hiss, FNP  escitalopram  (LEXAPRO ) 20 MG tablet Take 1 tablet (20 mg total) by mouth daily. Please take citalopram 10 mg( half pill) only for next 3 days starting from tomorrow.  Then stop it until you finish the course of linezolid .  Can resume previous dose after  2 days of finishing linezolid  08/26/23   Leona Rake, MD  fluconazole  (DIFLUCAN ) 100 MG tablet Take 2 tablets (200 mg total) by mouth daily. 08/30/23   Iruku, Praveena, MD  hydrocortisone  (ANUSOL -HC) 2.5 % rectal cream Place 1 Application rectally 2 (two) times daily. Patient not taking: Reported on 08/23/2023 01/09/23   Coretha Dew, PA-C  LORazepam  (ATIVAN ) 0.5 MG tablet Take 1 tablet (0.5 mg total) by mouth every 8 (eight) hours as needed for anxiety. 06/22/22   Iruku, Praveena, MD  methocarbamol  (ROBAXIN ) 500 MG tablet Take 1 tablet (500 mg total) by mouth every 6 (six) hours as needed for muscle spasms. Patient not taking: Reported on 08/23/2023 07/27/23   Sim Dryer, MD  oxyCODONE  (OXY IR/ROXICODONE ) 5 MG immediate release tablet Take 1 tablet (5 mg total) by mouth every 6 (six) hours as needed for severe pain (pain score 7-10). 07/27/23   Cornett, Andy Bannister, MD  pantoprazole  (PROTONIX ) 40 MG tablet Take 1 tablet (40 mg total) by mouth daily. Patient taking differently: Take 40 mg by mouth See admin instructions. Take 40  mg by mouth in the morning 30 minutes before breakfast and an additional 40 mg in the evening as needed for unresolved acid reflux symptoms 08/06/23   Roise Cleaver, MD  potassium chloride  (KLOR-CON ) 10 MEQ tablet TAKE TWO TABLETS DAILY AS DIRECTED 09/17/23   Murleen Arms, MD    Physical Exam: Vitals:   10/06/23 0115 10/06/23 0130 10/06/23 0145 10/06/23 0200  BP: (!) 149/78 (!) 144/96 138/64 127/68  Pulse: 86 86 (!) 101 84  Resp: 17 10 (!) 30 17  Temp:      TempSrc:      SpO2: 99% 99% 96% 98%  Weight:      Height:         Constitutional: NAD, calm  Eyes: PERTLA, lids and conjunctivae normal ENMT: Mucous membranes are moist. Posterior pharynx clear of any exudate or lesions.   Neck: supple, no masses  Respiratory: no wheezing, no crackles. No accessory muscle use.  Cardiovascular: S1 & S2 heard, regular rate and rhythm. No JVD. Abdomen: No tenderness, soft. Bowel sounds active.  Musculoskeletal: no clubbing / cyanosis. No joint deformity upper and lower extremities.   Skin: Bilateral breast scars with crust and minimal surrounding erythema; no heat, tenderness, active drainage, fluctuance, or crepitus. Skin is otherwise warm, dry, well-perfused. Neurologic: CN 2-12 grossly intact. Moving  all extremities. Alert and oriented.  Psychiatric: Pleasant. Cooperative.    Labs and Imaging on Admission: I have personally reviewed following labs and imaging studies  CBC: Recent Labs  Lab 10/02/23 1215 10/05/23 2158  WBC 6.5 6.5  NEUTROABS 3.7 4.7  HGB 8.9* 9.6*  HCT 28.6* 31.5*  MCV 85.1 88.2  PLT 255 161   Basic Metabolic Panel: Recent Labs  Lab 10/02/23 1351 10/05/23 2158  NA 142 136  K 4.2 3.6  CL 107 103  CO2 29 23  GLUCOSE 101* 118*  BUN 11 9  CREATININE 0.64 0.75  CALCIUM 9.2 9.1   GFR: Estimated Creatinine Clearance: 102.2 mL/min (by C-G formula based on SCr of 0.75 mg/dL). Liver Function Tests: Recent Labs  Lab 10/02/23 1351 10/05/23 2158  AST 23 54*  ALT 12 21  ALKPHOS 102 116  BILITOT 0.3 0.7  PROT 7.3 7.3  ALBUMIN 3.8 3.3*   Recent Labs  Lab 10/05/23 2158  LIPASE 30   No results for input(s): "AMMONIA" in the last 168 hours. Coagulation Profile: Recent Labs  Lab 10/05/23 2158  INR 1.0   Cardiac Enzymes: No results for input(s): "CKTOTAL", "CKMB", "CKMBINDEX", "TROPONINI" in the last 168 hours. BNP (last 3 results) No results for input(s): "PROBNP" in the last 8760 hours. HbA1C: No results for input(s): "HGBA1C" in the last 72 hours. CBG: No results for input(s): "GLUCAP" in the last 168 hours. Lipid Profile: No results for input(s): "CHOL", "HDL", "LDLCALC", "TRIG", "CHOLHDL", "LDLDIRECT" in the last 72 hours. Thyroid  Function Tests: No results for input(s): "TSH", "T4TOTAL", "FREET4", "T3FREE", "THYROIDAB" in the last 72 hours. Anemia Panel: No results for input(s): "VITAMINB12", "FOLATE", "FERRITIN", "TIBC", "IRON", "RETICCTPCT" in the last 72 hours. Urine analysis:    Component Value Date/Time   COLORURINE YELLOW 10/05/2023 2339   APPEARANCEUR CLEAR 10/05/2023 2339   APPEARANCEUR Clear 07/30/2020 0954   LABSPEC 1.011 10/05/2023 2339   PHURINE 7.0 10/05/2023 2339   GLUCOSEU NEGATIVE 10/05/2023 2339   HGBUR NEGATIVE  10/05/2023 2339   BILIRUBINUR NEGATIVE 10/05/2023 2339   BILIRUBINUR Negative 07/30/2020 0954   KETONESUR NEGATIVE 10/05/2023 2339   PROTEINUR NEGATIVE 10/05/2023 2339   NITRITE NEGATIVE  10/05/2023 2339   LEUKOCYTESUR SMALL (A) 10/05/2023 2339   Sepsis Labs: @LABRCNTIP (procalcitonin:4,lacticidven:4) ) Recent Results (from the past 240 hours)  Resp panel by RT-PCR (RSV, Flu A&B, Covid) Anterior Nasal Swab     Status: None   Collection Time: 10/05/23 10:37 PM   Specimen: Anterior Nasal Swab  Result Value Ref Range Status   SARS Coronavirus 2 by RT PCR NEGATIVE NEGATIVE Final    Comment: (NOTE) SARS-CoV-2 target nucleic acids are NOT DETECTED.  The SARS-CoV-2 RNA is generally detectable in upper respiratory specimens during the acute phase of infection. The lowest concentration of SARS-CoV-2 viral copies this assay can detect is 138 copies/mL. A negative result does not preclude SARS-Cov-2 infection and should not be used as the sole basis for treatment or other patient management decisions. A negative result may occur with  improper specimen collection/handling, submission of specimen other than nasopharyngeal swab, presence of viral mutation(s) within the areas targeted by this assay, and inadequate number of viral copies(<138 copies/mL). A negative result must be combined with clinical observations, patient history, and epidemiological information. The expected result is Negative.  Fact Sheet for Patients:  BloggerCourse.com  Fact Sheet for Healthcare Providers:  SeriousBroker.it  This test is no t yet approved or cleared by the [place]  FDA and  has been authorized for detection and/or diagnosis of SARS-CoV-2 by FDA under an Emergency Use Authorization (EUA). This EUA will remain  in effect (meaning this test can be used) for the duration of the COVID-19 declaration under Section 564(b)(1) of the Act, 21 U.S.C.section  360bbb-3(b)(1), unless the authorization is terminated  or revoked sooner.       Influenza A by PCR NEGATIVE NEGATIVE Final   Influenza B by PCR NEGATIVE NEGATIVE Final    Comment: (NOTE) The Xpert Xpress SARS-CoV-2/FLU/RSV plus assay is intended as an aid in the diagnosis of influenza from Nasopharyngeal swab specimens and should not be used as a sole basis for treatment. Nasal washings and aspirates are unacceptable for Xpert Xpress SARS-CoV-2/FLU/RSV testing.  Fact Sheet for Patients: BloggerCourse.com  Fact Sheet for Healthcare Providers: SeriousBroker.it  This test is not yet approved or cleared by the [place]  FDA and has been authorized for detection and/or diagnosis of SARS-CoV-2 by FDA under an Emergency Use Authorization (EUA). This EUA will remain in effect (meaning this test can be used) for the duration of the COVID-19 declaration under Section 564(b)(1) of the Act, 21 U.S.C. section 360bbb-3(b)(1), unless the authorization is terminated or revoked.     Resp Syncytial Virus by PCR NEGATIVE NEGATIVE Final    Comment: (NOTE) Fact Sheet for Patients: BloggerCourse.com  Fact Sheet for Healthcare Providers: SeriousBroker.it  This test is not yet approved or cleared by the [place]  FDA and has been authorized for detection and/or diagnosis of SARS-CoV-2 by FDA under an Emergency Use Authorization (EUA). This EUA will remain in effect (meaning this test can be used) for the duration of the COVID-19 declaration under Section 564(b)(1) of the Act, 21 U.S.C. section 360bbb-3(b)(1), unless the authorization is terminated or revoked.  Performed at [facility], [address] [place], [place] [address]      Radiological Exams on Admission: CT Angio Chest PE W and/or Wo Contrast Result Date: 10/06/2023 CLINICAL DATA:  Breast cancer, fever,  chills, pulmonary embolism, dyspnea, sepsis. * Tracking Code: BO * EXAM: CT ANGIOGRAPHY CHEST CT ABDOMEN AND PELVIS WITH CONTRAST TECHNIQUE: Multidetector CT imaging of the chest was performed using the standard protocol  during bolus administration of intravenous contrast. Multiplanar CT image reconstructions and MIPs were obtained to evaluate the vascular anatomy. Multidetector CT imaging of the abdomen and pelvis was performed using the standard protocol during bolus administration of intravenous contrast. RADIATION DOSE REDUCTION: This exam was performed according to the departmental dose-optimization program which includes automated exposure control, adjustment of the mA and/or kV according to patient size and/or use of iterative reconstruction technique. CONTRAST:  OMNIPAQUE  IOHEXOL  350 MG/ML SOLN COMPARISON:  08/23/2023 FINDINGS: CTA CHEST FINDINGS Cardiovascular: Borderline cardiomegaly. No significant coronary artery calcification. No pericardial effusion. Adequate opacification of the pulmonary arterial tree. No intraluminal filling defect identified to suggest acute pulmonary embolism. The thoracic aorta is unremarkable. Right internal jugular chest port tip seen within the superior vena cava. Mediastinum/Nodes: Shotty right paratracheal adenopathy appears stable since prior examination. No frankly pathologic thoracic adenopathy identified. The esophagus is unremarkable. Visualized thyroid  unremarkable. Lungs/Pleura: Mosaic attenuation of pulmonary parenchyma relates to multifocal air trapping, likely related to small airways disease. No confluent pulmonary infiltrate. No pneumothorax or pleural effusion. No central obstructing lesion. Musculoskeletal: No acute bone abnormality. No lytic or blastic bone lesion. Bilateral mastectomy has been performed with punctate subcutaneous gas noted within the right chest wall operative bed but improving subcutaneous edema when compared to prior examination and  resolution of the cavity involving the left anterior chest wall. Review of the MIP images confirms the above findings. CT ABDOMEN and PELVIS FINDINGS Hepatobiliary: No focal liver abnormality is seen. No gallstones, gallbladder wall thickening, or biliary dilatation. Pancreas: Unremarkable Spleen: Stable mild splenomegaly with the spleen measuring 15.2 cm in greatest dimension. No intrasplenic lesion identified. Adrenals/Urinary Tract: Adrenal glands are unremarkable. Kidneys are normal, without renal calculi, focal lesion, or hydronephrosis. Bladder is unremarkable. Stomach/Bowel: Mild sigmoid diverticulosis. Stomach, small bowel, and large bowel are otherwise unremarkable. Appendix normal. No evidence of obstruction or focal inflammation. No free intraperitoneal gas or fluid. Vascular/Lymphatic: No significant vascular findings are present. No enlarged abdominal or pelvic lymph nodes. Reproductive: Uterus and bilateral adnexa are unremarkable. Other: Tiny fat containing umbilical hernia Musculoskeletal: No acute bone abnormality. No lytic or blastic bone lesion. Osseous structures are age appropriate. Review of the MIP images confirms the above findings. IMPRESSION: 1. No acute pulmonary embolism. No acute intrathoracic findings. 2. Mosaic attenuation of the pulmonary parenchyma most in keeping with multifocal air trapping, likely related to small airways disease. 3. No acute intra-abdominal pathology identified. No evidence of metastatic disease within the abdomen and pelvis. 4. Stable mild splenomegaly Electronically Signed   By: Worthy Heads M.D.   On: 10/06/2023 01:18   CT ABDOMEN PELVIS W CONTRAST Result Date: 10/06/2023 CLINICAL DATA:  Breast cancer, fever, chills, pulmonary embolism, dyspnea, sepsis. * Tracking Code: BO * EXAM: CT ANGIOGRAPHY CHEST CT ABDOMEN AND PELVIS WITH CONTRAST TECHNIQUE: Multidetector CT imaging of the chest was performed using the standard protocol during bolus administration  of intravenous contrast. Multiplanar CT image reconstructions and MIPs were obtained to evaluate the vascular anatomy. Multidetector CT imaging of the abdomen and pelvis was performed using the standard protocol during bolus administration of intravenous contrast. RADIATION DOSE REDUCTION: This exam was performed according to the departmental dose-optimization program which includes automated exposure control, adjustment of the mA and/or kV according to patient size and/or use of iterative reconstruction technique. CONTRAST:  OMNIPAQUE  IOHEXOL  350 MG/ML SOLN COMPARISON:  08/23/2023 FINDINGS: CTA CHEST FINDINGS Cardiovascular: Borderline cardiomegaly. No significant coronary artery calcification. No pericardial effusion. Adequate opacification of the pulmonary  arterial tree. No intraluminal filling defect identified to suggest acute pulmonary embolism. The thoracic aorta is unremarkable. Right internal jugular chest port tip seen within the superior vena cava. Mediastinum/Nodes: Shotty right paratracheal adenopathy appears stable since prior examination. No frankly pathologic thoracic adenopathy identified. The esophagus is unremarkable. Visualized thyroid  unremarkable. Lungs/Pleura: Mosaic attenuation of pulmonary parenchyma relates to multifocal air trapping, likely related to small airways disease. No confluent pulmonary infiltrate. No pneumothorax or pleural effusion. No central obstructing lesion. Musculoskeletal: No acute bone abnormality. No lytic or blastic bone lesion. Bilateral mastectomy has been performed with punctate subcutaneous gas noted within the right chest wall operative bed but improving subcutaneous edema when compared to prior examination and resolution of the cavity involving the left anterior chest wall. Review of the MIP images confirms the above findings. CT ABDOMEN and PELVIS FINDINGS Hepatobiliary: No focal liver abnormality is seen. No gallstones, gallbladder wall thickening, or  biliary dilatation. Pancreas: Unremarkable Spleen: Stable mild splenomegaly with the spleen measuring 15.2 cm in greatest dimension. No intrasplenic lesion identified. Adrenals/Urinary Tract: Adrenal glands are unremarkable. Kidneys are normal, without renal calculi, focal lesion, or hydronephrosis. Bladder is unremarkable. Stomach/Bowel: Mild sigmoid diverticulosis. Stomach, small bowel, and large bowel are otherwise unremarkable. Appendix normal. No evidence of obstruction or focal inflammation. No free intraperitoneal gas or fluid. Vascular/Lymphatic: No significant vascular findings are present. No enlarged abdominal or pelvic lymph nodes. Reproductive: Uterus and bilateral adnexa are unremarkable. Other: Tiny fat containing umbilical hernia Musculoskeletal: No acute bone abnormality. No lytic or blastic bone lesion. Osseous structures are age appropriate. Review of the MIP images confirms the above findings. IMPRESSION: 1. No acute pulmonary embolism. No acute intrathoracic findings. 2. Mosaic attenuation of the pulmonary parenchyma most in keeping with multifocal air trapping, likely related to small airways disease. 3. No acute intra-abdominal pathology identified. No evidence of metastatic disease within the abdomen and pelvis. 4. Stable mild splenomegaly Electronically Signed   By: Worthy Heads M.D.   On: 10/06/2023 01:18   DG Chest 2 View Result Date: 10/05/2023 CLINICAL DATA:  Sepsis EXAM: CHEST - 2 VIEW COMPARISON:  None Available. FINDINGS: Lungs are clear. No pneumothorax or pleural effusion. Right internal jugular chest port tip seen within the superior cavoatrial junction. Cardiac size within normal limits. Or vascularity is normal. No acute bone abnormality. Surgical clips noted within the right breast. IMPRESSION: 1. No active cardiopulmonary disease. Electronically Signed   By: Worthy Heads M.D.   On: 10/05/2023 21:58    EKG: Independently reviewed. Sinus rhythm.   Assessment/Plan    1. SIRS  - Continue broad-spectrum antibiotics for now while following cultures and clinical course    2. Breast cancer  - S/p b/l mastectomy, currently treated with Kadcyla  under the care of Dr. Arno Bibles    3. Depression, anxiety  - Lexapro     4. Anemia  - Appears stable, no overt bleeding     DVT prophylaxis: Lovenox   Code Status: Full  Level of Care: Level of care: Med-Surg Family Communication: Friend at bedside   Disposition Plan:  Patient is from: home  Anticipated d/c is to: Home  Anticipated d/c date is: Possibly as early as 5/18 or 10/08/23  Patient currently: Pending clinical course  Consults called: None  Admission status: Observation     Walton Guppy, MD [facility]  10/06/2023, 2:26 AM

## 2023-10-06 NOTE — ED Provider Notes (Signed)
  Physical Exam  BP 138/64   Pulse (!) 101   Temp 99.4 F (37.4 C)   Resp (!) 30   Ht 5\' 1"  (1.549 m)   Wt 125 kg   LMP 10/13/2019 (Approximate)   SpO2 96%   BMI 52.07 kg/m   Physical Exam  Procedures  Procedures  ED Course / MDM    Medical Decision Making Amount and/or Complexity of Data Reviewed Labs: ordered. Radiology: ordered.  Risk OTC drugs. Prescription drug management. Decision regarding hospitalization.   Patient care assumed at shift handoff from [facility], PA-C.  See her note for full details. In short, 53 year old female patient here for evaluation of fever.  Patient with history of breast cancer and multiple myeloma currently getting Kadcyla  infusions.  Patient last received an infusion on May 13.  Last month she was admitted for sepsis due to cellulitis of her right breast.  Today that wound appears much better but does have some small amount of yellow drainage.  She is having chills, myalgias, "bone pain" today.  At time of arrival patient had an oral temperature of 101.6 F.  She was tachycardic.  Code sepsis was activated.  Prior to my assumption of care patient treated with broad-spectrum antibiotics including vancomycin , cefepime , Flagyl , and LR bolus.  Plan on likely hospitalist admission after results of CT PE study and CT abdomen pelvis with contrast.  CT scans showed: 1. No acute pulmonary embolism. No acute intrathoracic findings.  2. Mosaic attenuation of the pulmonary parenchyma most in keeping  with multifocal air trapping, likely related to small airways  disease.  3. No acute intra-abdominal pathology identified. No evidence of  metastatic disease within the abdomen and pelvis.  4. Stable mild splenomegaly  I agree with the radiologist findings  I requested consultation with the hospitalist for admission for sepsis of unknown origin. Dr.Opyd agrees to see patient for observation admission      Delories Fetter 10/06/23  0221    Lindle Rhea, MD 10/06/23 [phone]

## 2023-10-06 NOTE — Plan of Care (Signed)

## 2023-10-06 NOTE — Progress Notes (Signed)
 PROGRESS NOTE  Diamond Marshall  DOB: [birth year]  PCP: Roise Cleaver, MD WUJ:[id number]  DOA: 10/05/2023  LOS: 0 days  Hospital Day: 2  Brief narrative: Diamond Marshall is a 53 y.o. female with PMH significant for HTN, breast cancer s/p mastectomy, hypothyroidism, GERD, chronic anemia, vitamin B12 deficiency, anxiety/depression. 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 Afton Horse.  She has subsequently had MRSA infection of the right mastectomy site requiring hospitalization.  Also reports wound VAC treatment of the left mastectomy site.  She follows up with Dr. Afton Horse as an outpatient and lately has been struggling with persistent drainage from the right mastectomy site. Patient follows up with oncologist Dr. Arno Bibles, currently undergoing treatment with Kadcyla , last treatment on 5/13. 5/16, patient presented to the ED with complaint of fever, chills, malaise, nausea  In the ED, patient had a fever of 101.6, blood pressure elevated to 150s, breathing on room air Labs with WBC count 6.5, hemoglobin 9.6, CMP unremarkable, lactic acid level normal Respiratory virus panel unremarkable Blood cultures collected CTA chest did not show any pulm embolism but showed mosaic attenuation of the pulmonary parenchyma most in keeping with multifocal air trapping, likely related to small airways disease. Patient was given IV cefepime , IV vancomycin , IV Flagyl .  Subjective: Patient was seen and examined this morning. Chart reviewed Low-grade temperature of 100.2 this morning, hemodynamically stable Labs from this morning with calcium low at 3.4  Assessment and plan: Sepsis POA Presented with fever of 101.6, malaise in the setting of cancer and chemo High risk and suspicion of bacterial infection but no localized etiology identified yet. Patient states that she has been lately struggling with persistent drainage from right mastectomy site.  She  reports MRSA infection at that site as well.  She is concerned if that is the source of the infection again.  Clinically I see mild drainage.  I have requested general surgery evaluation. Currently on broad-spectrum IV antibiotic coverage with IV cefepime , IV vancomycin  and IV Flagyl  Pending blood culture report Continues to have low-grade temperature.  WBC count has not mounted Recent Labs  Lab 10/02/23 1215 10/05/23 2158 10/05/23 2200 10/06/23 0033 10/06/23 0936  WBC 6.5 6.5  --   --  4.2  LATICACIDVEN  --   --  1.3 1.0  --    Inflammatory breast cancer  s/p bilateral mastectomy Patient follows up with oncologist Dr. Arno Bibles, currently undergoing treatment with Kadcyla , last treatment on 5/13.  Hypertension Coreg  3.125 mg twice daily  Chronic anemia Hemoglobin stable between 9 and 10 Recent Labs    06/29/23 1124 07/09/23 2309 09/03/23 0936 09/17/23 1207 10/02/23 1215 10/05/23 2158 10/06/23 0936  HGB 8.7*   < > 9.5* 9.2* 8.9* 9.6* 8.4*  MCV 100.8*   < > 93.4 87.4 85.1 88.2 87.1  VITAMINB12 675  --   --   --   --   --   --    < > = values in this interval not displayed.   GERD PPI  Anxiety/depression Lexapro , as needed Ativan    Mobility: Encourage ambulation  Goals of care   Code Status: Full Code     DVT prophylaxis:  enoxaparin  (LOVENOX ) injection 40 mg Start: 10/06/23 1000   Antimicrobials: Broad-spectrum antibiotic coverage with IV cefepime , IV vancomycin  and IV Flagyl  Fluid: None Consultants: General Surgery Family Communication: Daughter at bedside  Status: Observation Level of care:  Med-Surg   Patient is from: Home Needs to continue  in-hospital care: Needs IV antibiotics and further workup Anticipated d/c to: Hopefully home in 1 to 2 days    Diet:  Diet Order             Diet regular Room service appropriate? Yes; Fluid consistency: Thin  Diet effective now                   Scheduled Meds:  carvedilol   3.125 mg Oral BID WC    enoxaparin  (LOVENOX ) injection  40 mg Subcutaneous Q24H   escitalopram   20 mg Oral Daily    PRN meds: acetaminophen  **OR** acetaminophen , HYDROmorphone  (DILAUDID ) injection, ondansetron  **OR** ondansetron  (ZOFRAN ) IV, oxyCODONE , polyethylene glycol   Infusions:   ceFEPime  (MAXIPIME ) IV 2 g (10/06/23 0750)   metronidazole  500 mg (10/06/23 1107)   vancomycin       Antimicrobials: Anti-infectives (From admission, onward)    Start     Dose/Rate Route Frequency Ordered Stop   10/06/23 1200  metroNIDAZOLE  (FLAGYL ) IVPB 500 mg        500 mg 100 mL/hr over 60 Minutes Intravenous Every 12 hours 10/06/23 0226 10/13/23 1159   10/06/23 1200  vancomycin  (VANCOREADY) IVPB 1750 mg/350 mL        1,750 mg 175 mL/hr over 120 Minutes Intravenous Every 24 hours 10/06/23 0302     10/06/23 0800  ceFEPIme  (MAXIPIME ) 2 g in sodium chloride  0.9 % 100 mL IVPB        2 g 200 mL/hr over 30 Minutes Intravenous Every 8 hours 10/06/23 0252     10/05/23 2215  ceFEPIme  (MAXIPIME ) 2 g in sodium chloride  0.9 % 100 mL IVPB        2 g 200 mL/hr over 30 Minutes Intravenous  Once 10/05/23 2205 10/06/23 0058   10/05/23 2215  metroNIDAZOLE  (FLAGYL ) IVPB 500 mg        500 mg 100 mL/hr over 60 Minutes Intravenous  Once 10/05/23 2205 10/06/23 0056   10/05/23 2215  vancomycin  (VANCOCIN ) IVPB 1000 mg/200 mL premix        1,000 mg 200 mL/hr over 60 Minutes Intravenous  Once 10/05/23 2205 10/06/23 0056       Objective: Vitals:   10/06/23 0318 10/06/23 0732  BP: 136/74 (!) 153/90  Pulse: 82 91  Resp: 18 16  Temp: 98.9 F (37.2 C) 100.2 F (37.9 C)  SpO2: 95% 97%   No intake or output data in the 24 hours ending 10/06/23 1122 Filed Weights   10/05/23 2139  Weight: 125 kg   Weight change:  Body mass index is 52.07 kg/m.   Physical Exam: General exam: Pleasant, middle-aged Caucasian female Skin: No rashes, lesions or ulcers. HEENT: Atraumatic, normocephalic, no obvious bleeding Lungs: Clear to auscultation  bilaterally,  Chest wall: Bilateral mastectomy site examined.  Left is intact.  Right with mild soakage.  There is an area of redness around which she states is due to tape and not cellulitis. CVS: S1, S2, no murmur,   GI/Abd: Soft, nontender, distended from obesity, bowel sound present,   CNS: Alert, awake, oriented x 3 Psychiatry: Mood appropriate,  Extremities: No pedal edema, no calf tenderness,   Data Review: I have personally reviewed the laboratory data and studies available.  F/u labs ordered Unresulted Labs (From admission, onward)     Start     Ordered   10/13/23 0500  Creatinine, serum  (enoxaparin  (LOVENOX )    CrCl >/= 30 ml/min)  Weekly,   R     Comments:  while on enoxaparin  therapy    10/06/23 0226   10/06/23 0500  HIV Antibody (routine testing w rflx)  (HIV Antibody (Routine testing w reflex) panel)  Tomorrow morning,   R        10/06/23 0226   10/06/23 0500  Basic metabolic panel  Daily,   R      10/06/23 0226   10/06/23 0500  CBC  Daily,   R      10/06/23 0226   10/06/23 0401  MRSA Next Gen by PCR, Nasal  Once,   R        10/06/23 0401   10/05/23 2143  Blood culture (routine x 2)  BLOOD CULTURE X 2,   R      10/05/23 2142            Signed, Hoyt Macleod, MD [facility] 10/06/2023

## 2023-10-06 NOTE — Consult Note (Signed)
 Consulting Physician: Avon Boers Marionette Meskill  Referring Provider: Dr. Gwynneth Lessen  Chief Complaint: Fevers  Reason for Consult: Wound check after mastectomy   Subjective   HPI: Diamond Marshall is an 53 y.o. female who is here for fevers.  She has inflammatory breast cancer and had a bilateral mastectomy.  She has been dealing with some wound healing issues on the right side.  No particular change in the wound, though she may have a little bit increased drainage.  She recently was told by Dr. Afton Horse to stop packing the wound as it had healed up and was shallow enough that it was difficult to pack.  She is worried about the fevers and chills she is not experiencing for the last couple days.  Her mother passed away a few days prior to her mastectomy from sepsis and she is very scared of developing sepsis herself.  She recently received a dose of immuno and chemotherapy.  During her infusion visit some labs were drawn from her port, and she is wondering if they may have been contaminated.  Past Medical History:  Diagnosis Date   Abdominal hernia    Anemia    b12 deficiency   Anxiety    Arthritis    B12 deficiency    Back pain    Cancer (HCC)    breast cancer   Depression    Family history of adverse reaction to anesthesia    mother vomits after anesthesia   Gallbladder problem    GERD (gastroesophageal reflux disease)    History of hiatal hernia    "small" per patient   Hypertension    Hypothyroidism    Joint pain    Knee pain    Palpitation    "when iron is low"   Pernicious anemia    SOB (shortness of breath)    Thyroid  disease    hypothyroidism   Vitamin D  deficiency     Past Surgical History:  Procedure Laterality Date   BREAST BIOPSY Left 06/09/2022   US  LT BREAST BX W LOC DEV 1ST LESION IMG BX SPEC US  GUIDE 06/09/2022 [facility]   CESAREAN SECTION  1999   Dilatation and currettagement     for miscarriage 18 years ago   PORTACATH PLACEMENT Right 07/05/2022    Procedure: INSERTION PORT-A-CATH;  Surgeon: Sim Dryer, MD;  Location: [facility] OR;  Service: General;  Laterality: Right;   SIMPLE MASTECTOMY WITH AXILLARY SENTINEL NODE BIOPSY Bilateral 07/26/2023   Procedure: LEFT SKIN REDUCING MASTECTOMY, RIGHT SKIN SPARING, RISK REDUCING MASTECTOMY;  Surgeon: Sim Dryer, MD;  Location: [facility] OR;  Service: General;  Laterality: Bilateral;  PEC BLOCK    Family History  Problem Relation Age of Onset   Hypertension Mother    Diabetes Mother    High Cholesterol Mother    Thyroid  disease Mother    Depression Mother    Anxiety disorder Mother    Obesity Mother    CVA Father        hemorrhagic   Ovarian cancer Maternal Aunt 74   Colon cancer Maternal Aunt    Breast cancer Cousin 35       maternal first cousin, reports negative genetic testing    Social:  reports that she has never smoked. She has never used smokeless tobacco. She reports that she does not drink alcohol and does not use drugs.  Allergies:  Allergies  Allergen Reactions   Phentermine  Other (See Comments)    Became suicidal on the medication  Bee Venom Swelling and Other (See Comments)    Severe swelling where stung   Lactose Intolerance (Gi) Diarrhea   Peanut Allergen Powder-Dnfp Other (See Comments)    Migraines   Topiramate  Other (See Comments)    Dizziness    Sulfa Antibiotics Itching and Rash   Tape Rash   Wound Dressing Adhesive Rash    Medications: Current Outpatient Medications  Medication Instructions   acetaminophen  (TYLENOL ) 650 mg, Oral, Every 6 hours PRN   budesonide  (ENTOCORT EC ) 9 mg, Oral, Daily   carvedilol  (COREG ) 3.125 MG tablet TAKE ONE TABLET TWICE DAILY WITH MEAL(S)   colestipol  (COLESTID ) 2 g, Oral, 2 times daily   cyanocobalamin  (VITAMIN B12) 1000 MCG/ML injection INJECT 1 ML INTRAMUSCULARLY EVERY 15 DAYS   cycloSPORINE  (RESTASIS ) 0.05 % ophthalmic emulsion 1 drop, Both Eyes, 2 times daily   diphenoxylate -atropine  (LOMOTIL ) 2.5-0.025 MG tablet TAKE  TWO TABLETS EVERY 6 HOURS AS NEEDED FOR DIARRHEA   EPINEPHrine  (EPI-PEN) 0.3 mg, Intramuscular, As needed   escitalopram  (LEXAPRO ) 20 mg, Oral, Daily, Please take citalopram 10 mg( half pill) only for next 3 days starting from tomorrow.  Then stop it until you finish the course of linezolid .  Can resume previous dose after  2 days of finishing linezolid    fluconazole  (DIFLUCAN ) 200 mg, Oral, Daily   hydrocortisone  (ANUSOL -HC) 2.5 % rectal cream 1 Application, Rectal, 2 times daily   LORazepam  (ATIVAN ) 0.5 mg, Oral, Every 8 hours PRN   methocarbamol  (ROBAXIN ) 500 mg, Oral, Every 6 hours PRN   oxyCODONE  (OXY IR/ROXICODONE ) 5 mg, Oral, Every 6 hours PRN   pantoprazole  (PROTONIX ) 40 mg, Oral, Daily   potassium chloride  (KLOR-CON ) 10 MEQ tablet TAKE TWO TABLETS DAILY AS DIRECTED   sulfamethoxazole-trimethoprim (BACTRIM DS) 800-160 MG tablet 1 tablet, 2 times daily    ROS - all of the below systems have been reviewed with the patient and positives are indicated with bold text General: chills, fever or night sweats Eyes: blurry vision or double vision ENT: epistaxis or sore throat Allergy/Immunology: itchy/watery eyes or nasal congestion Hematologic/Lymphatic: bleeding problems, blood clots or swollen lymph nodes Endocrine: temperature intolerance or unexpected weight changes Breast: new or changing breast lumps or nipple discharge Resp: cough, shortness of breath, or wheezing CV: chest pain or dyspnea on exertion GI: as per HPI GU: dysuria, trouble voiding, or hematuria MSK: joint pain or joint stiffness Neuro: TIA or stroke symptoms Derm: pruritus and skin lesion changes Psych: anxiety and depression  Objective   PE Blood pressure (!) 153/90, pulse 91, temperature 100.2 F (37.9 C), temperature source Oral, resp. rate 16, height 5\' 1"  (1.549 m), weight 125 kg, last menstrual period 10/13/2019, SpO2 97%, unknown if currently breastfeeding. Constitutional: NAD; conversant; no  deformities Eyes: Moist conjunctiva; no lid lag; anicteric; PERRL Neck: Trachea midline; no thyromegaly Lungs: Normal respiratory effort; no tactile fremitus CV: RRR; no palpable thrills; no pitting edema GI: Abd soft, nontender, nondistended; no palpable hepatosplenomegaly MSK: Normal range of motion of extremities; no clubbing/cyanosis Psychiatric: Appropriate affect; alert and oriented x3 Lymphatic: No palpable cervical or axillary lymphadenopathy  Breast exam -bilateral mastectomy scars.  The left side is fully healed.  The right side has a small 1 cm tall by 2 cm wide open wound with healthy appearing granulation tissue in the base of the wound and some thin drainage.  There is no frank pus in the wound.  There is no surrounding erythema or spreading erythema or induration.   Results for orders placed or  performed during the hospital encounter of 10/05/23 (from the past 24 hours)  Comprehensive metabolic panel     Status: Abnormal   Collection Time: 10/05/23  9:58 PM  Result Value Ref Range   Sodium 136 135 - 145 mmol/L   Potassium 3.6 3.5 - 5.1 mmol/L   Chloride 103 98 - 111 mmol/L   CO2 23 22 - 32 mmol/L   Glucose, Bld 118 (H) 70 - 99 mg/dL   BUN 9 6 - 20 mg/dL   Creatinine, Ser 4.54 0.44 - 1.00 mg/dL   Calcium 9.1 8.9 - 09.8 mg/dL   Total Protein 7.3 6.5 - 8.1 g/dL   Albumin 3.3 (L) 3.5 - 5.0 g/dL   AST 54 (H) 15 - 41 U/L   ALT 21 0 - 44 U/L   Alkaline Phosphatase 116 38 - 126 U/L   Total Bilirubin 0.7 0.0 - 1.2 mg/dL   GFR, Estimated >11 >91 mL/min   Anion gap 10 5 - 15  CBC with Differential     Status: Abnormal   Collection Time: 10/05/23  9:58 PM  Result Value Ref Range   WBC 6.5 4.0 - 10.5 K/uL   RBC 3.57 (L) 3.87 - 5.11 MIL/uL   Hemoglobin 9.6 (L) 12.0 - 15.0 g/dL   HCT 47.8 (L) 29.5 - 62.1 %   MCV 88.2 80.0 - 100.0 fL   MCH 26.9 26.0 - 34.0 pg   MCHC 30.5 30.0 - 36.0 g/dL   RDW 30.8 (H) 65.7 - 84.6 %   Platelets 161 150 - 400 K/uL   nRBC 0.0 0.0 - 0.2 %    Neutrophils Relative % 71 %   Neutro Abs 4.7 1.7 - 7.7 K/uL   Lymphocytes Relative 16 %   Lymphs Abs 1.0 0.7 - 4.0 K/uL   Monocytes Relative 8 %   Monocytes Absolute 0.5 0.1 - 1.0 K/uL   Eosinophils Relative 3 %   Eosinophils Absolute 0.2 0.0 - 0.5 K/uL   Basophils Relative 1 %   Basophils Absolute 0.0 0.0 - 0.1 K/uL   Immature Granulocytes 1 %   Abs Immature Granulocytes 0.05 0.00 - 0.07 K/uL  Lipase, blood     Status: None   Collection Time: 10/05/23  9:58 PM  Result Value Ref Range   Lipase 30 11 - 51 U/L  Protime-INR     Status: None   Collection Time: 10/05/23  9:58 PM  Result Value Ref Range   Prothrombin Time 13.5 11.4 - 15.2 seconds   INR 1.0 0.8 - 1.2  I-Stat Lactic Acid, ED     Status: None   Collection Time: 10/05/23 10:00 PM  Result Value Ref Range   Lactic Acid, Venous 1.3 0.5 - 1.9 mmol/L  Resp panel by RT-PCR (RSV, Flu A&B, Covid) Anterior Nasal Swab     Status: None   Collection Time: 10/05/23 10:37 PM   Specimen: Anterior Nasal Swab  Result Value Ref Range   SARS Coronavirus 2 by RT PCR NEGATIVE NEGATIVE   Influenza A by PCR NEGATIVE NEGATIVE   Influenza B by PCR NEGATIVE NEGATIVE   Resp Syncytial Virus by PCR NEGATIVE NEGATIVE  Blood culture (routine x 2)     Status: None (Preliminary result)   Collection Time: 10/05/23 11:00 PM   Specimen: BLOOD RIGHT FOREARM  Result Value Ref Range   Specimen Description      BLOOD RIGHT FOREARM Performed at [facility], [address] [place], [place] [address]  Special Requests      BOTTLES DRAWN AEROBIC AND ANAEROBIC Blood Culture results may not be optimal due to an inadequate volume of blood received in culture bottles Performed at [facility], [address], [place], [place] [address]    Culture PENDING    Report Status PENDING   Urinalysis, w/ Reflex to Culture (Infection Suspected) -Urine, Clean Catch     Status: Abnormal   Collection Time: 10/05/23 11:39 PM  Result Value Ref  Range   Specimen Source URINE, CLEAN CATCH    Color, Urine YELLOW YELLOW   APPearance CLEAR CLEAR   Specific Gravity, Urine 1.011 1.005 - 1.030   pH 7.0 5.0 - 8.0   Glucose, UA NEGATIVE NEGATIVE mg/dL   Hgb urine dipstick NEGATIVE NEGATIVE   Bilirubin Urine NEGATIVE NEGATIVE   Ketones, ur NEGATIVE NEGATIVE mg/dL   Protein, ur NEGATIVE NEGATIVE mg/dL   Nitrite NEGATIVE NEGATIVE   Leukocytes,Ua SMALL (A) NEGATIVE   RBC / HPF 0-5 0 - 5 RBC/hpf   WBC, UA 0-5 0 - 5 WBC/hpf   Bacteria, UA RARE (A) NONE SEEN   Squamous Epithelial / HPF 0-5 0 - 5 /HPF   Mucus PRESENT   I-Stat Lactic Acid, ED     Status: None   Collection Time: 10/06/23 12:33 AM  Result Value Ref Range   Lactic Acid, Venous 1.0 0.5 - 1.9 mmol/L  Basic metabolic panel     Status: Abnormal   Collection Time: 10/06/23  7:35 AM  Result Value Ref Range   Sodium 138 135 - 145 mmol/L   Potassium 3.4 (L) 3.5 - 5.1 mmol/L   Chloride 102 98 - 111 mmol/L   CO2 23 22 - 32 mmol/L   Glucose, Bld 88 70 - 99 mg/dL   BUN 7 6 - 20 mg/dL   Creatinine, Ser 1.19 0.44 - 1.00 mg/dL   Calcium 9.1 8.9 - 14.7 mg/dL   GFR, Estimated >82 >95 mL/min   Anion gap 13 5 - 15  Magnesium     Status: None   Collection Time: 10/06/23  7:35 AM  Result Value Ref Range   Magnesium 1.9 1.7 - 2.4 mg/dL  Hepatic function panel     Status: Abnormal   Collection Time: 10/06/23  7:35 AM  Result Value Ref Range   Total Protein 6.5 6.5 - 8.1 g/dL   Albumin 3.0 (L) 3.5 - 5.0 g/dL   AST 45 (H) 15 - 41 U/L   ALT 20 0 - 44 U/L   Alkaline Phosphatase 109 38 - 126 U/L   Total Bilirubin 0.6 0.0 - 1.2 mg/dL   Bilirubin, Direct <6.2 0.0 - 0.2 mg/dL   Indirect Bilirubin NOT CALCULATED 0.3 - 0.9 mg/dL  CBC     Status: Abnormal   Collection Time: 10/06/23  9:36 AM  Result Value Ref Range   WBC 4.2 4.0 - 10.5 K/uL   RBC 3.19 (L) 3.87 - 5.11 MIL/uL   Hemoglobin 8.4 (L) 12.0 - 15.0 g/dL   HCT 13.0 (L) 86.5 - 78.4 %   MCV 87.1 80.0 - 100.0 fL   MCH 26.3 26.0 -  34.0 pg   MCHC 30.2 30.0 - 36.0 g/dL   RDW 69.6 (H) 29.5 - 28.4 %   Platelets 120 (L) 150 - 400 K/uL   nRBC 0.0 0.0 - 0.2 %     Imaging Orders         DG Chest 2 View  CT Angio Chest PE W and/or Wo Contrast         CT ABDOMEN PELVIS W CONTRAST      Assessment and Plan   Diamond Marshall is an 53 y.o. female with fevers of unknown origin and a recent breast surgery with a healing right mastectomy wound.  This wound does not appear to be the source of her infection.  She does have a port, which may be the issue.  I packed the right chest wound at the bedside.  We will check back on the patient in the morning.  Depending on the medical team's workup, she may require her port removed.  Surgery team will follow.     ICD-10-CM   1. Sepsis without acute organ dysfunction, due to unspecified organism (HCC)  A41.9     2. Immunocompromised state (HCC)  D84.9        Junie Olds, MD  [facility] Use [email or web address] to contact on call provider  New Patient Billing: [id number] - Moderate MDM

## 2023-10-06 NOTE — ED Notes (Signed)
 Patient ambulated to bathroom.

## 2023-10-07 DIAGNOSIS — C50911 Malignant neoplasm of unspecified site of right female breast: Secondary | ICD-10-CM | POA: Diagnosis not present

## 2023-10-07 DIAGNOSIS — R509 Fever, unspecified: Secondary | ICD-10-CM | POA: Diagnosis not present

## 2023-10-07 DIAGNOSIS — D84821 Immunodeficiency due to drugs: Secondary | ICD-10-CM | POA: Diagnosis not present

## 2023-10-07 DIAGNOSIS — R5381 Other malaise: Secondary | ICD-10-CM

## 2023-10-07 DIAGNOSIS — R6510 Systemic inflammatory response syndrome (SIRS) of non-infectious origin without acute organ dysfunction: Secondary | ICD-10-CM | POA: Diagnosis not present

## 2023-10-07 LAB — CBC
HCT: 29.5 % — ABNORMAL LOW (ref 36.0–46.0)
Hemoglobin: 9 g/dL — ABNORMAL LOW (ref 12.0–15.0)
MCH: 26.2 pg (ref 26.0–34.0)
MCHC: 30.5 g/dL (ref 30.0–36.0)
MCV: 85.8 fL (ref 80.0–100.0)
Platelets: 128 10*3/uL — ABNORMAL LOW (ref 150–400)
RBC: 3.44 MIL/uL — ABNORMAL LOW (ref 3.87–5.11)
RDW: 17.1 % — ABNORMAL HIGH (ref 11.5–15.5)
WBC: 4.6 10*3/uL (ref 4.0–10.5)
nRBC: 0 % (ref 0.0–0.2)

## 2023-10-07 LAB — BASIC METABOLIC PANEL WITH GFR
Anion gap: 13 (ref 5–15)
BUN: 9 mg/dL (ref 6–20)
CO2: 20 mmol/L — ABNORMAL LOW (ref 22–32)
Calcium: 8.8 mg/dL — ABNORMAL LOW (ref 8.9–10.3)
Chloride: 102 mmol/L (ref 98–111)
Creatinine, Ser: 0.75 mg/dL (ref 0.44–1.00)
GFR, Estimated: >60 mL/min (ref >60)
Glucose, Bld: 98 mg/dL (ref 70–99)
Potassium: 3.5 mmol/L (ref 3.5–5.1)
Sodium: 135 mmol/L (ref 135–145)

## 2023-10-07 LAB — LACTIC ACID, PLASMA: Lactic Acid, Venous: 0.9 mmol/L (ref 0.5–1.9)

## 2023-10-07 MED ORDER — SODIUM CHLORIDE 0.9% FLUSH: 10.0000 mL | INTRAVENOUS | Status: DC | PRN

## 2023-10-07 MED ORDER — CHLORHEXIDINE GLUCONATE CLOTH 2 % EX PADS
6.0000 | MEDICATED_PAD | Freq: Every day | CUTANEOUS | Status: DC
  Administered 2023-10-07: 6 via TOPICAL

## 2023-10-07 MED ORDER — IBUPROFEN 200 MG PO TABS
600.0000 mg | ORAL_TABLET | Freq: Once | ORAL | Status: AC
  Administered 2023-10-07: 600 mg via ORAL
  Filled 2023-10-07: qty 3

## 2023-10-07 MED ORDER — SODIUM CHLORIDE 0.9% FLUSH
10.0000 mL | Freq: Two times a day (BID) | INTRAVENOUS | Status: DC
  Administered 2023-10-07 – 2023-10-08 (×2): 10 mL

## 2023-10-07 MED ORDER — CLOTRIMAZOLE 1 % VA CREA
1.0000 | TOPICAL_CREAM | Freq: Every day | VAGINAL | Status: DC
  Filled 2023-10-07: qty 45

## 2023-10-07 MED ORDER — MICONAZOLE NITRATE 2 % EX CREA
TOPICAL_CREAM | Freq: Two times a day (BID) | CUTANEOUS | Status: DC
  Filled 2023-10-07: qty 28

## 2023-10-07 NOTE — Progress Notes (Signed)
 Subjective/Chief Complaint: Fevers overnight.  No pain.     Objective: Vital signs in last 24 hours: Temp:  [97.9 F (36.6 C)-103.1 F (39.5 C)] 97.9 F (36.6 C) (05/18 0624) Pulse Rate:  [71-95] 71 (05/18 0624) Resp:  [14-16] 14 (05/18 0624) BP: (123-149)/(62-84) 128/72 (05/18 0624) SpO2:  [90 %-97 %] 92 % (05/18 0624) Last BM Date : 10/05/23  Intake/Output from previous day: 05/17 0701 - 05/18 0700 In: 1030 [P.O.:480; IV Piggyback:550] Out: -  Intake/Output this shift: No intake/output data recorded.  Right mastectomy wound without erythema.  Small 1x1 cm defect with some serous drainage.  Less that when I saw her in clinic around 6 weeks ago  Port site right subclavian location (internal jugular port) non tender, no erythema or seroma Left mastectomy scar with some vascular congestion centrally with some fibrinous exudate, but no erythema or wound breakdown.    Lab Results:  Recent Labs    10/06/23 0936 10/07/23 0244  WBC 4.2 4.6  HGB 8.4* 9.0*  HCT 27.8* 29.5*  PLT 120* 128*   BMET Recent Labs    10/06/23 0735 10/07/23 0244  NA 138 135  K 3.4* 3.5  CL 102 102  CO2 23 20*  GLUCOSE 88 98  BUN 7 9  CREATININE 0.64 0.75  CALCIUM 9.1 8.8*   PT/INR Recent Labs    10/05/23 2158  LABPROT 13.5  INR 1.0   ABG No results for input(s): "PHART", "HCO3" in the last 72 hours.  Invalid input(s): "PCO2", "PO2"  Studies/Results: CT Angio Chest PE W and/or Wo Contrast Result Date: 10/06/2023 CLINICAL DATA:  Breast cancer, fever, chills, pulmonary embolism, dyspnea, sepsis. * Tracking Code: BO * EXAM: CT ANGIOGRAPHY CHEST CT ABDOMEN AND PELVIS WITH CONTRAST TECHNIQUE: Multidetector CT imaging of the chest was performed using the standard protocol during bolus administration of intravenous contrast. Multiplanar CT image reconstructions and MIPs were obtained to evaluate the vascular anatomy. Multidetector CT imaging of the abdomen and pelvis was performed using  the standard protocol during bolus administration of intravenous contrast. RADIATION DOSE REDUCTION: This exam was performed according to the departmental dose-optimization program which includes automated exposure control, adjustment of the mA and/or kV according to patient size and/or use of iterative reconstruction technique. CONTRAST:  OMNIPAQUE  IOHEXOL  350 MG/ML SOLN COMPARISON:  08/23/2023 FINDINGS: CTA CHEST FINDINGS Cardiovascular: Borderline cardiomegaly. No significant coronary artery calcification. No pericardial effusion. Adequate opacification of the pulmonary arterial tree. No intraluminal filling defect identified to suggest acute pulmonary embolism. The thoracic aorta is unremarkable. Right internal jugular chest port tip seen within the superior vena cava. Mediastinum/Nodes: Shotty right paratracheal adenopathy appears stable since prior examination. No frankly pathologic thoracic adenopathy identified. The esophagus is unremarkable. Visualized thyroid  unremarkable. Lungs/Pleura: Mosaic attenuation of pulmonary parenchyma relates to multifocal air trapping, likely related to small airways disease. No confluent pulmonary infiltrate. No pneumothorax or pleural effusion. No central obstructing lesion. Musculoskeletal: No acute bone abnormality. No lytic or blastic bone lesion. Bilateral mastectomy has been performed with punctate subcutaneous gas noted within the right chest wall operative bed but improving subcutaneous edema when compared to prior examination and resolution of the cavity involving the left anterior chest wall. Review of the MIP images confirms the above findings. CT ABDOMEN and PELVIS FINDINGS Hepatobiliary: No focal liver abnormality is seen. No gallstones, gallbladder wall thickening, or biliary dilatation. Pancreas: Unremarkable Spleen: Stable mild splenomegaly with the spleen measuring 15.2 cm in greatest dimension. No intrasplenic lesion identified. Adrenals/Urinary Tract:  Adrenal glands are unremarkable. Kidneys are normal, without renal calculi, focal lesion, or hydronephrosis. Bladder is unremarkable. Stomach/Bowel: Mild sigmoid diverticulosis. Stomach, small bowel, and large bowel are otherwise unremarkable. Appendix normal. No evidence of obstruction or focal inflammation. No free intraperitoneal gas or fluid. Vascular/Lymphatic: No significant vascular findings are present. No enlarged abdominal or pelvic lymph nodes. Reproductive: Uterus and bilateral adnexa are unremarkable. Other: Tiny fat containing umbilical hernia Musculoskeletal: No acute bone abnormality. No lytic or blastic bone lesion. Osseous structures are age appropriate. Review of the MIP images confirms the above findings. IMPRESSION: 1. No acute pulmonary embolism. No acute intrathoracic findings. 2. Mosaic attenuation of the pulmonary parenchyma most in keeping with multifocal air trapping, likely related to small airways disease. 3. No acute intra-abdominal pathology identified. No evidence of metastatic disease within the abdomen and pelvis. 4. Stable mild splenomegaly Electronically Signed   By: Worthy Heads M.D.   On: 10/06/2023 01:18   CT ABDOMEN PELVIS W CONTRAST Result Date: 10/06/2023 CLINICAL DATA:  Breast cancer, fever, chills, pulmonary embolism, dyspnea, sepsis. * Tracking Code: BO * EXAM: CT ANGIOGRAPHY CHEST CT ABDOMEN AND PELVIS WITH CONTRAST TECHNIQUE: Multidetector CT imaging of the chest was performed using the standard protocol during bolus administration of intravenous contrast. Multiplanar CT image reconstructions and MIPs were obtained to evaluate the vascular anatomy. Multidetector CT imaging of the abdomen and pelvis was performed using the standard protocol during bolus administration of intravenous contrast. RADIATION DOSE REDUCTION: This exam was performed according to the departmental dose-optimization program which includes automated exposure control, adjustment of the mA and/or  kV according to patient size and/or use of iterative reconstruction technique. CONTRAST:  OMNIPAQUE  IOHEXOL  350 MG/ML SOLN COMPARISON:  08/23/2023 FINDINGS: CTA CHEST FINDINGS Cardiovascular: Borderline cardiomegaly. No significant coronary artery calcification. No pericardial effusion. Adequate opacification of the pulmonary arterial tree. No intraluminal filling defect identified to suggest acute pulmonary embolism. The thoracic aorta is unremarkable. Right internal jugular chest port tip seen within the superior vena cava. Mediastinum/Nodes: Shotty right paratracheal adenopathy appears stable since prior examination. No frankly pathologic thoracic adenopathy identified. The esophagus is unremarkable. Visualized thyroid  unremarkable. Lungs/Pleura: Mosaic attenuation of pulmonary parenchyma relates to multifocal air trapping, likely related to small airways disease. No confluent pulmonary infiltrate. No pneumothorax or pleural effusion. No central obstructing lesion. Musculoskeletal: No acute bone abnormality. No lytic or blastic bone lesion. Bilateral mastectomy has been performed with punctate subcutaneous gas noted within the right chest wall operative bed but improving subcutaneous edema when compared to prior examination and resolution of the cavity involving the left anterior chest wall. Review of the MIP images confirms the above findings. CT ABDOMEN and PELVIS FINDINGS Hepatobiliary: No focal liver abnormality is seen. No gallstones, gallbladder wall thickening, or biliary dilatation. Pancreas: Unremarkable Spleen: Stable mild splenomegaly with the spleen measuring 15.2 cm in greatest dimension. No intrasplenic lesion identified. Adrenals/Urinary Tract: Adrenal glands are unremarkable. Kidneys are normal, without renal calculi, focal lesion, or hydronephrosis. Bladder is unremarkable. Stomach/Bowel: Mild sigmoid diverticulosis. Stomach, small bowel, and large bowel are otherwise unremarkable. Appendix  normal. No evidence of obstruction or focal inflammation. No free intraperitoneal gas or fluid. Vascular/Lymphatic: No significant vascular findings are present. No enlarged abdominal or pelvic lymph nodes. Reproductive: Uterus and bilateral adnexa are unremarkable. Other: Tiny fat containing umbilical hernia Musculoskeletal: No acute bone abnormality. No lytic or blastic bone lesion. Osseous structures are age appropriate. Review of the MIP images confirms the above findings. IMPRESSION: 1. No acute pulmonary embolism. No  acute intrathoracic findings. 2. Mosaic attenuation of the pulmonary parenchyma most in keeping with multifocal air trapping, likely related to small airways disease. 3. No acute intra-abdominal pathology identified. No evidence of metastatic disease within the abdomen and pelvis. 4. Stable mild splenomegaly Electronically Signed   By: Worthy Heads M.D.   On: 10/06/2023 01:18   DG Chest 2 View Result Date: 10/05/2023 CLINICAL DATA:  Sepsis EXAM: CHEST - 2 VIEW COMPARISON:  None Available. FINDINGS: Lungs are clear. No pneumothorax or pleural effusion. Right internal jugular chest port tip seen within the superior cavoatrial junction. Cardiac size within normal limits. Or vascularity is normal. No acute bone abnormality. Surgical clips noted within the right breast. IMPRESSION: 1. No active cardiopulmonary disease. Electronically Signed   By: Worthy Heads M.D.   On: 10/05/2023 21:58    Anti-infectives: Anti-infectives (From admission, onward)    Start     Dose/Rate Route Frequency Ordered Stop   10/06/23 1200  metroNIDAZOLE  (FLAGYL ) IVPB 500 mg        500 mg 100 mL/hr over 60 Minutes Intravenous Every 12 hours 10/06/23 0226 10/13/23 1159   10/06/23 1200  vancomycin  (VANCOREADY) IVPB 1750 mg/350 mL        1,750 mg 175 mL/hr over 120 Minutes Intravenous Every 24 hours 10/06/23 0302     10/06/23 0800  ceFEPIme  (MAXIPIME ) 2 g in sodium chloride  0.9 % 100 mL IVPB        2 g 200  mL/hr over 30 Minutes Intravenous Every 8 hours 10/06/23 0252     10/05/23 2215  ceFEPIme  (MAXIPIME ) 2 g in sodium chloride  0.9 % 100 mL IVPB        2 g 200 mL/hr over 30 Minutes Intravenous  Once 10/05/23 2205 10/06/23 0058   10/05/23 2215  metroNIDAZOLE  (FLAGYL ) IVPB 500 mg        500 mg 100 mL/hr over 60 Minutes Intravenous  Once 10/05/23 2205 10/06/23 0056   10/05/23 2215  vancomycin  (VANCOCIN ) IVPB 1000 mg/200 mL premix        1,000 mg 200 mL/hr over 60 Minutes Intravenous  Once 10/05/23 2205 10/06/23 0056       Assessment/Plan: s/p * No surgery found * Patient is status post bilateral mastectomy 07/26/2023 and has right sided port in place. This was complicated by wound infection and wound breakdown.  She did have a MRSA infection which is cleared  She is on chemotherapy at this point.  Admitted with fever  Wounds evaluated yesterday and today.  No evidence of wound infection on right or left side and no evidence of port site infection.  Blood cultures are pending, but it does not look like they have adequate blood for completing the culture.  It appears she is being treated for neutropenic fever.  I will communicate with Dr. Afton Horse, but surgery will sign off.  Please contact us  for other questions.  From a wound care standpoint, dry dressing daily to the right open wound.    LOS: 1 day    Lockie Rima 10/07/2023

## 2023-10-07 NOTE — Discharge Instructions (Signed)
 Dry dressing daily right mastectomy wound once daily.  Ok to shower with dressing off.

## 2023-10-07 NOTE — Progress Notes (Signed)
 PROGRESS NOTE  Diamond Marshall  DOB: 1970-07-21  PCP: Roise Cleaver, MD MWU:[id number]  DOA: 10/05/2023  LOS: 1 day  Hospital Day: 3  Brief narrative: Diamond Marshall is a 53 y.o. female with PMH significant for HTN, breast cancer s/p mastectomy, hypothyroidism, GERD, chronic anemia, vitamin B12 deficiency, anxiety/depression. 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 Afton Horse.  She has subsequently had MRSA infection of the right mastectomy site requiring hospitalization.  Also reports wound VAC treatment of the left mastectomy site.  She follows up with Dr. Afton Horse as an outpatient and lately has been struggling with persistent drainage from the right mastectomy site. Patient follows up with oncologist Dr. Arno Bibles, currently undergoing treatment with Kadcyla , last treatment on 5/13. 5/16, patient presented to the ED with complaint of fever, chills, malaise, nausea  In the ED, patient had a fever of 101.6, blood pressure elevated to 150s, breathing on room air Labs with WBC count 6.5, hemoglobin 9.6, CMP unremarkable, lactic acid level normal Respiratory virus panel unremarkable Blood cultures collected CTA chest did not show any pulm embolism but showed mosaic attenuation of the pulmonary parenchyma most in keeping with multifocal air trapping, likely related to small airways disease. Patient was given IV cefepime , IV vancomycin , IV Flagyl .  Subjective: Patient was seen and examined this morning. Lying in bed. Not in distress. Had fever for several hours yesterday evening. Defervesed since early this am.  Assessment and plan: Sepsis POA Presented with fever of 101.6, malaise in the setting of cancer and chemo High risk and suspicion of bacterial infection but no localized etiology identified yet. Patient states that she has been lately struggling with persistent drainage from right mastectomy site.  She reports MRSA infection  at that site as well.  She is concerned if that is the source of the infection again.  Clinically I see mild drainage. General surgery eval was obtained. No need of surgical intervention.  Port site on right anterior chest wall doesn't look infected. Currently on broad-spectrum IV antibiotic coverage with IV cefepime , IV vancomycin  and IV Flagyl  Pending blood culture report Continues to have low-grade temperature.  WBC count has not mounted Recent Labs  Lab 10/02/23 1215 10/05/23 2158 10/05/23 2200 10/06/23 0033 10/06/23 0936 10/07/23 0241 10/07/23 0244  WBC 6.5 6.5  --   --  4.2  --  4.6  LATICACIDVEN  --   --  1.3 1.0  --  0.9  --    Inflammatory breast cancer  s/p bilateral mastectomy Patient follows up with oncologist Dr. Arno Bibles, currently undergoing treatment with Kadcyla , last treatment on 5/13.  Hypertension Coreg  3.125 mg twice daily  Chronic anemia Hemoglobin stable between 9 and 10 Recent Labs    06/29/23 1124 07/09/23 2309 09/17/23 1207 10/02/23 1215 10/05/23 2158 10/06/23 0936 10/07/23 0244  HGB 8.7*   < > 9.2* 8.9* 9.6* 8.4* 9.0*  MCV 100.8*   < > 87.4 85.1 88.2 87.1 85.8  VITAMINB12 675  --   --   --   --   --   --    < > = values in this interval not displayed.   GERD PPI  Anxiety/depression Lexapro , as needed Ativan    Mobility: Encourage ambulation  Goals of care   Code Status: Full Code     DVT prophylaxis:  enoxaparin  (LOVENOX ) injection 40 mg Start: 10/06/23 1000   Antimicrobials: Broad-spectrum antibiotic coverage with IV cefepime , IV vancomycin  and IV Flagyl  Fluid: None  Consultants: General Surgery Family Communication: Daughter not at bedside.  Status: Inpatient Level of care:  Med-Surg   Patient is from: Home Needs to continue in-hospital care: Needs IV antibiotics and further workup Anticipated d/c to: Hopefully home in 1 to 2 days    Diet:  Diet Order             Diet regular Room service appropriate? Yes; Fluid  consistency: Thin  Diet effective now                   Scheduled Meds:  carvedilol   3.125 mg Oral BID WC   enoxaparin  (LOVENOX ) injection  40 mg Subcutaneous Q24H   escitalopram   20 mg Oral Daily    PRN meds: acetaminophen  **OR** acetaminophen , HYDROmorphone  (DILAUDID ) injection, ondansetron  **OR** ondansetron  (ZOFRAN ) IV, oxyCODONE , polyethylene glycol   Infusions:   ceFEPime  (MAXIPIME ) IV 2 g (10/07/23 0813)   metronidazole  500 mg (10/07/23 1204)   vancomycin  1,750 mg (10/07/23 1326)    Antimicrobials: Anti-infectives (From admission, onward)    Start     Dose/Rate Route Frequency Ordered Stop   10/06/23 1200  metroNIDAZOLE  (FLAGYL ) IVPB 500 mg        500 mg 100 mL/hr over 60 Minutes Intravenous Every 12 hours 10/06/23 0226 10/13/23 1159   10/06/23 1200  vancomycin  (VANCOREADY) IVPB 1750 mg/350 mL        1,750 mg 175 mL/hr over 120 Minutes Intravenous Every 24 hours 10/06/23 0302     10/06/23 0800  ceFEPIme  (MAXIPIME ) 2 g in sodium chloride  0.9 % 100 mL IVPB        2 g 200 mL/hr over 30 Minutes Intravenous Every 8 hours 10/06/23 0252     10/05/23 2215  ceFEPIme  (MAXIPIME ) 2 g in sodium chloride  0.9 % 100 mL IVPB        2 g 200 mL/hr over 30 Minutes Intravenous  Once 10/05/23 2205 10/06/23 0058   10/05/23 2215  metroNIDAZOLE  (FLAGYL ) IVPB 500 mg        500 mg 100 mL/hr over 60 Minutes Intravenous  Once 10/05/23 2205 10/06/23 0056   10/05/23 2215  vancomycin  (VANCOCIN ) IVPB 1000 mg/200 mL premix        1,000 mg 200 mL/hr over 60 Minutes Intravenous  Once 10/05/23 2205 10/06/23 0056       Objective: Vitals:   10/07/23 1028 10/07/23 1400  BP: 126/77 119/72  Pulse: 72 74  Resp: 16 18  Temp: 98.3 F (36.8 C) 98.4 F (36.9 C)  SpO2: 95% 94%    Intake/Output Summary (Last 24 hours) at 10/07/2023 1601 Last data filed at 10/07/2023 1042 Gross per 24 hour  Intake 240 ml  Output --  Net 240 ml   Filed Weights   10/05/23 2139  Weight: 125 kg   Weight  change:  Body mass index is 52.07 kg/m.   Physical Exam: General exam: Pleasant, middle-aged Caucasian female Skin: No rashes, lesions or ulcers. HEENT: Atraumatic, normocephalic, no obvious bleeding Lungs: Clear to auscultation bilaterally,  Chest wall: Bilateral mastectomy site examined. Left is intact. Right has bandage on. Port site with no evidence of infection.  CVS: S1, S2, no murmur,   GI/Abd: Soft, nontender, distended from obesity, bowel sound present CNS: Alert, awake, oriented x 3 Psychiatry: Mood appropriate Extremities: No pedal edema, no calf tenderness  Data Review: I have personally reviewed the laboratory data and studies available.  F/u labs ordered [employer] (From admission, onward)     Start  Ordered   10/13/23 0500  Creatinine, serum  (enoxaparin  (LOVENOX )    CrCl >/= 30 ml/min)  Weekly,   R     Comments: while on enoxaparin  therapy    10/06/23 0226   10/06/23 0500  Basic metabolic panel  Daily,   R      10/06/23 0226   10/06/23 0500  CBC  Daily,   R      10/06/23 0226            Signed, Hoyt Macleod, MD [facility] 10/07/2023

## 2023-10-07 NOTE — Progress Notes (Signed)
 Per. Dr. Gwynneth Lessen, ok to access port at this time. Per surgery notes from today 5/18 no current concern for infected port. Port site clean and dry with no erythema at this time. Accessed port without incident.

## 2023-10-07 NOTE — Plan of Care (Signed)

## 2023-10-07 NOTE — Plan of Care (Signed)
  Problem: Clinical Measurements: Goal: Diagnostic test results will improve Outcome: Progressing Goal: Signs and symptoms of infection will decrease Outcome: Progressing   Problem: Clinical Measurements: Goal: Ability to maintain clinical measurements within normal limits will improve Outcome: Progressing Goal: Will remain free from infection Outcome: Progressing Goal: Diagnostic test results will improve Outcome: Progressing   Problem: Activity: Goal: Risk for activity intolerance will decrease Outcome: Progressing   Problem: Nutrition: Goal: Adequate nutrition will be maintained Outcome: Progressing   Problem: Coping: Goal: Level of anxiety will decrease Outcome: Progressing

## 2023-10-08 DIAGNOSIS — R5381 Other malaise: Secondary | ICD-10-CM | POA: Diagnosis not present

## 2023-10-08 DIAGNOSIS — D84821 Immunodeficiency due to drugs: Secondary | ICD-10-CM | POA: Diagnosis not present

## 2023-10-08 DIAGNOSIS — R6510 Systemic inflammatory response syndrome (SIRS) of non-infectious origin without acute organ dysfunction: Secondary | ICD-10-CM | POA: Diagnosis not present

## 2023-10-08 DIAGNOSIS — C50911 Malignant neoplasm of unspecified site of right female breast: Secondary | ICD-10-CM | POA: Diagnosis not present

## 2023-10-08 DIAGNOSIS — R509 Fever, unspecified: Secondary | ICD-10-CM | POA: Diagnosis not present

## 2023-10-08 LAB — BASIC METABOLIC PANEL WITH GFR
Anion gap: 11 (ref 5–15)
BUN: 11 mg/dL (ref 6–20)
CO2: 23 mmol/L (ref 22–32)
Calcium: 8.7 mg/dL — ABNORMAL LOW (ref 8.9–10.3)
Chloride: 102 mmol/L (ref 98–111)
Creatinine, Ser: 0.62 mg/dL (ref 0.44–1.00)
GFR, Estimated: >60 mL/min (ref >60)
Glucose, Bld: 86 mg/dL (ref 70–99)
Potassium: 3.2 mmol/L — ABNORMAL LOW (ref 3.5–5.1)
Sodium: 136 mmol/L (ref 135–145)

## 2023-10-08 LAB — CBC
HCT: 28.2 % — ABNORMAL LOW (ref 36.0–46.0)
Hemoglobin: 8.6 g/dL — ABNORMAL LOW (ref 12.0–15.0)
MCH: 26.2 pg (ref 26.0–34.0)
MCHC: 30.5 g/dL (ref 30.0–36.0)
MCV: 86 fL (ref 80.0–100.0)
Platelets: 105 10*3/uL — ABNORMAL LOW (ref 150–400)
RBC: 3.28 MIL/uL — ABNORMAL LOW (ref 3.87–5.11)
RDW: 16.8 % — ABNORMAL HIGH (ref 11.5–15.5)
WBC: 4 10*3/uL (ref 4.0–10.5)
nRBC: 0 % (ref 0.0–0.2)

## 2023-10-08 MED ORDER — AMOXICILLIN-POT CLAVULANATE 875-125 MG PO TABS: 1.0000 | ORAL_TABLET | Freq: Two times a day (BID) | ORAL | 0 refills | Status: AC

## 2023-10-08 MED ORDER — ENOXAPARIN SODIUM 60 MG/0.6ML IJ SOSY
60.0000 mg | PREFILLED_SYRINGE | INTRAMUSCULAR | Status: DC
  Administered 2023-10-08: 60 mg via SUBCUTANEOUS
  Filled 2023-10-08: qty 0.6

## 2023-10-08 MED ORDER — FLORANEX PO PACK: 1.0000 g | PACK | Freq: Three times a day (TID) | ORAL | 0 refills | Status: AC

## 2023-10-08 MED ORDER — POTASSIUM CHLORIDE ER 10 MEQ PO TBCR
20.0000 meq | EXTENDED_RELEASE_TABLET | Freq: Every day | ORAL | Status: DC
  Administered 2023-10-08: 20 meq via ORAL
  Filled 2023-10-08 (×2): qty 2

## 2023-10-08 MED ORDER — DOXYCYCLINE HYCLATE 100 MG PO TABS: 100.0000 mg | ORAL_TABLET | Freq: Two times a day (BID) | ORAL | 0 refills | Status: AC

## 2023-10-08 MED ORDER — HEPARIN SOD (PORK) LOCK FLUSH 100 UNIT/ML IV SOLN
500.0000 [IU] | Freq: Once | INTRAVENOUS | Status: AC
  Administered 2023-10-08: 500 [IU] via INTRAVENOUS
  Filled 2023-10-08: qty 5

## 2023-10-08 NOTE — Plan of Care (Signed)
 Went over discharge instructions with patient. She verbalized her understanding, vital signs stable, port deaccessed. Pt going by private vehicle.  Problem: Fluid Volume: Goal: Hemodynamic stability will improve Outcome: Adequate for Discharge   Problem: Clinical Measurements: Goal: Diagnostic test results will improve Outcome: Adequate for Discharge Goal: Signs and symptoms of infection will decrease Outcome: Adequate for Discharge   Problem: Respiratory: Goal: Ability to maintain adequate ventilation will improve Outcome: Adequate for Discharge   Problem: Education: Goal: Knowledge of General Education information will improve Description: Including pain rating scale, medication(s)/side effects and non-pharmacologic comfort measures Outcome: Adequate for Discharge   Problem: Health Behavior/Discharge Planning: Goal: Ability to manage health-related needs will improve Outcome: Adequate for Discharge   Problem: Clinical Measurements: Goal: Ability to maintain clinical measurements within normal limits will improve Outcome: Adequate for Discharge Goal: Will remain free from infection Outcome: Adequate for Discharge Goal: Diagnostic test results will improve Outcome: Adequate for Discharge Goal: Respiratory complications will improve Outcome: Adequate for Discharge Goal: Cardiovascular complication will be avoided Outcome: Adequate for Discharge   Problem: Activity: Goal: Risk for activity intolerance will decrease Outcome: Adequate for Discharge   Problem: Nutrition: Goal: Adequate nutrition will be maintained Outcome: Adequate for Discharge   Problem: Coping: Goal: Level of anxiety will decrease Outcome: Adequate for Discharge   Problem: Elimination: Goal: Will not experience complications related to bowel motility Outcome: Adequate for Discharge Goal: Will not experience complications related to urinary retention Outcome: Adequate for Discharge   Problem: Pain  Managment: Goal: General experience of comfort will improve and/or be controlled Outcome: Adequate for Discharge   Problem: Safety: Goal: Ability to remain free from injury will improve Outcome: Adequate for Discharge   Problem: Skin Integrity: Goal: Risk for impaired skin integrity will decrease Outcome: Adequate for Discharge

## 2023-10-08 NOTE — Progress Notes (Signed)
   10/08/23 1018  TOC Brief Assessment  Insurance and Status Reviewed  Patient has primary care physician Yes  Home environment has been reviewed Single family home with spouse  Prior level of function: Independent  Prior/Current Home Services No current home services  Social Drivers of Health Review SDOH reviewed no interventions necessary  Readmission risk has been reviewed Yes  Transition of care needs transition of care needs identified, TOC will continue to follow   Pt screened and there are no TOC needs at this time. TOC will follow for any new needs or recommendations.

## 2023-10-08 NOTE — Discharge Summary (Signed)
 Physician Discharge Summary  Diamond Marshall ZOX:[id number] DOB: [birth year] DOA: 10/05/2023  PCP: Roise Cleaver, MD  Admit date: 10/05/2023 Discharge date: 10/08/2023  Admitted From: Home Discharge disposition: Home  Recommendations at discharge:  Complete the course of antibiotics with 5 more days of oral Augmentin , oral doxycycline  and probiotics. Follow-up with oncologist as an outpatient.   Brief narrative: Diamond Marshall is a 53 y.o. female with PMH significant for HTN, breast cancer s/p mastectomy, hypothyroidism, GERD, chronic anemia, vitamin B12 deficiency, anxiety/depression. 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 Afton Horse.  She has subsequently had MRSA infection of the right mastectomy site requiring hospitalization.  Also reports wound VAC treatment of the left mastectomy site.  She follows up with Dr. Afton Horse as an outpatient and lately has been struggling with persistent drainage from the right mastectomy site. Patient follows up with oncologist Dr. Arno Bibles, currently undergoing treatment with Kadcyla , last treatment on 5/13. 5/16, patient presented to the ED with complaint of fever, chills, malaise, nausea  In the ED, patient had a fever of 101.6, blood pressure elevated to 150s, breathing on room air Labs with WBC count 6.5, hemoglobin 9.6, CMP unremarkable, lactic acid level normal Respiratory virus panel unremarkable Blood cultures collected CTA chest did not show any pulm embolism but showed mosaic attenuation of the pulmonary parenchyma most in keeping with multifocal air trapping, likely related to small airways disease. Patient was given IV cefepime , IV vancomycin , IV Flagyl . Admitted to [facility]  Subjective: Patient was seen and examined this morning. Lying down in bed.  Not in distress.  Feels much better.  Wants to go home. No fever in last 24 hours.  Assessment and plan: Sepsis POA Presented with  fever of 101.6, malaise in the setting of cancer and chemo Given her immunocompromise status, there was a strong suspicion of bacterial infection but no localized etiology was identified. Patient stated that she has been lately struggling with persistent drainage from right mastectomy site.  She reported MRSA infection at that site as well.  She was concerned if that is the source of the infection again.   General surgery eval was obtained.  It was thought unlikely to be the source of infection.  Surgical intervention was not needed. Port site on right anterior chest wall did not look infected. Since admission patient has been on broad-spectrum IV antibiotic coverage with IV cefepime , IV vancomycin  and IV Flagyl  No growth on blood culture report. Last fever was 1 AM on 5/18.  No fever in the last 24 hours. Patient feels better and feels ready to go home.  Will discharge her on 5 more days of oral Augmentin  and doxycycline  with probiotics Recent Labs  Lab 10/02/23 1215 10/05/23 2158 10/05/23 2200 10/06/23 0033 10/06/23 0936 10/07/23 0241 10/07/23 0244 10/08/23 0517  WBC 6.5 6.5  --   --  4.2  --  4.6 4.0  LATICACIDVEN  --   --  1.3 1.0  --  0.9  --   --    Inflammatory breast cancer  s/p bilateral mastectomy Patient follows up with oncologist Dr. Arno Bibles, currently undergoing treatment with Kadcyla , last treatment on 5/13. Continue follow-up as an outpatient  Hypertension Coreg  3.125 mg twice daily  Chronic anemia Hemoglobin stable between 9 and 10 Recent Labs    06/29/23 1124 07/09/23 2309 10/02/23 1215 10/05/23 2158 10/06/23 0936 10/07/23 0244 10/08/23 0517  HGB 8.7*   < > 8.9* 9.6* 8.4* 9.0* 8.6*  MCV 100.8*   < > 85.1 88.2 87.1 85.8 86.0  VITAMINB12 675  --   --   --   --   --   --    < > = values in this interval not displayed.   GERD PPI  Anxiety/depression Lexapro , as needed Ativan   Independently able to ambulate.  Goals of care   Code Status: Full Code    Diet:  Diet Order             Diet general           Diet regular Room service appropriate? Yes; Fluid consistency: Thin  Diet effective now                   Nutritional status:  Body mass index is 52.07 kg/m.       Wounds:  - Wound / Incision (Open or Dehisced) 08/23/23 Incision - Open Breast Left;Mid (Active)  Date First Assessed/Time First Assessed: 08/23/23 1835   Wound Type: Incision - Open  Location: Breast  Location Orientation: Left;Mid  Present on Admission: Yes    Assessments 08/23/2023  6:24 PM 08/27/2023  9:57 AM  Dressing Type Moist to dry Negative pressure wound therapy  Dressing Changed New --  Dressing Status Clean, Dry, Intact Clean, Dry, Intact  Dressing Change Frequency Twice a day --  Site / Wound Assessment Dressing in place / Unable to assess --     No associated orders.    Discharge Exam:   Vitals:   10/07/23 1400 10/07/23 2056 10/08/23 0548 10/08/23 0807  BP: 119/72 116/69 114/60 121/81  Pulse: 74 79 76 78  Resp: 18 14 14    Temp: 98.4 F (36.9 C) 98.9 F (37.2 C) 98.4 F (36.9 C)   TempSrc: Oral Oral Oral   SpO2: 94% 94% 93%   Weight:      Height:        Body mass index is 52.07 kg/m.  General exam: Pleasant, middle-aged Caucasian female Skin: No rashes, lesions or ulcers. HEENT: Atraumatic, normocephalic, no obvious bleeding Lungs: Clear to auscultation bilaterally,  Chest wall: Bilateral mastectomy site intact.  Port site with no evidence of infection.  CVS: S1, S2, no murmur,   GI/Abd: Soft, nontender, distended from obesity, bowel sound present CNS: Alert, awake, oriented x 3 Psychiatry: Mood appropriate Extremities: No pedal edema, no calf tenderness  Follow ups:    Follow-up Information     Cornett, Andy Bannister, MD Follow up.   Specialty: General Surgery Why: as previously instructed Contact information: [address] [place] [address] [phone]         Roise Cleaver, MD Follow up.    Specialty: Family Medicine Contact information: [address] [place] [address] [phone]         Murleen Arms, MD Follow up.   Specialty: Hematology and Oncology Contact information: [address] [place] [address] [phone]                 Discharge Instructions:   Discharge Instructions     Call MD for:  difficulty breathing, headache or visual disturbances   Complete by: As directed    Call MD for:  extreme fatigue   Complete by: As directed    Call MD for:  hives   Complete by: As directed    Call MD for:  persistant dizziness or light-headedness   Complete by: As directed    Call MD for:  persistant  nausea and vomiting   Complete by: As directed    Call MD for:  severe uncontrolled pain   Complete by: As directed    Call MD for:  temperature >100.4   Complete by: As directed    Diet general   Complete by: As directed    Discharge instructions   Complete by: As directed    Recommendations at discharge:   Complete the course of antibiotics with 5 more days of oral Augmentin , oral doxycycline  and probiotics.  Follow-up with oncologist as an outpatient.  General discharge instructions: Follow with Primary MD Roise Cleaver, MD in 7 days  Please request your PCP  to go over your hospital tests, procedures, radiology results at the follow up. Please get your medicines reviewed and adjusted.  Your PCP may decide to repeat certain labs or tests as needed. Do not drive, operate heavy machinery, perform activities at heights, swimming or participation in water activities or provide baby sitting services if your were admitted for syncope or siezures until you have seen by Primary MD or a Neurologist and advised to do so again. [place]  Controlled Substance Reporting System database was reviewed. Do not drive, operate heavy machinery, perform activities at heights, swim, participate in water activities or provide baby-sitting services while  on medications for pain, sleep and mood until your outpatient physician has reevaluated you and advised to do so again.  You are strongly recommended to comply with the dose, frequency and duration of prescribed medications. Activity: As tolerated with Full fall precautions use walker/cane & assistance as needed Avoid using any recreational substances like cigarette, tobacco, alcohol, or non-prescribed drug. If you experience worsening of your admission symptoms, develop shortness of breath, life threatening emergency, suicidal or homicidal thoughts you must seek medical attention immediately by calling 911 or calling your MD immediately  if symptoms less severe. You must read complete instructions/literature along with all the possible adverse reactions/side effects for all the medicines you take and that have been prescribed to you. Take any new medicine only after you have completely understood and accepted all the possible adverse reactions/side effects.  Wear Seat belts while driving. You were cared for by a hospitalist during your hospital stay. If you have any questions about your discharge medications or the care you received while you were in the hospital after you are discharged, you can call the unit and ask to speak with the hospitalist or the covering physician. Once you are discharged, your primary care physician will handle any further medical issues. Please note that NO REFILLS for any discharge medications will be authorized once you are discharged, as it is imperative that you return to your primary care physician (or establish a relationship with a primary care physician if you do not have one).   Increase activity slowly   Complete by: As directed        Discharge Medications:   Allergies as of 10/08/2023       Reactions   Phentermine  Other (See Comments)   Became suicidal on the medication   Bee Venom Swelling, Other (See Comments)   Severe swelling where stung   Lactose  Intolerance (gi) Diarrhea   Peanut Allergen Powder-dnfp Other (See Comments)   Migraines   Topiramate  Other (See Comments)   Dizziness   Sulfa Antibiotics Itching, Rash   Tape Rash   Wound Dressing Adhesive Rash        Medication List     STOP taking these medications  budesonide  3 MG 24 hr capsule Commonly known as: ENTOCORT EC    colestipol  1 g tablet Commonly known as: COLESTID    diphenoxylate -atropine  2.5-0.025 MG tablet Commonly known as: LOMOTIL    fluconazole  100 MG tablet Commonly known as: DIFLUCAN    hydrocortisone  2.5 % rectal cream Commonly known as: ANUSOL -HC   methocarbamol  500 MG tablet Commonly known as: ROBAXIN    sulfamethoxazole-trimethoprim 800-160 MG tablet Commonly known as: BACTRIM DS       TAKE these medications    acetaminophen  325 MG tablet Commonly known as: TYLENOL  Take 2 tablets (650 mg total) by mouth every 6 (six) hours as needed for mild pain (pain score 1-3) (or Fever >/= 101).   amoxicillin -clavulanate 875-125 MG tablet Commonly known as: AUGMENTIN  Take 1 tablet by mouth 2 (two) times daily for 12 days.   carvedilol  3.125 MG tablet Commonly known as: COREG  TAKE ONE TABLET TWICE DAILY WITH MEAL(S)   cyanocobalamin  1000 MCG/ML injection Commonly known as: VITAMIN B12 INJECT 1 ML INTRAMUSCULARLY EVERY 15 DAYS   cycloSPORINE  0.05 % ophthalmic emulsion Commonly known as: RESTASIS  Place 1 drop into both eyes 2 (two) times daily.   doxycycline  100 MG tablet Commonly known as: VIBRA -TABS Take 1 tablet (100 mg total) by mouth 2 (two) times daily for 5 days.   EPINEPHrine  0.3 mg/0.3 mL Soaj injection Commonly known as: EPI-PEN Inject 0.3 mg into the muscle as needed for anaphylaxis.   escitalopram  20 MG tablet Commonly known as: LEXAPRO  Take 1 tablet (20 mg total) by mouth daily. Please take citalopram 10 mg( half pill) only for next 3 days starting from tomorrow.  Then stop it until you finish the course of linezolid .   Can resume previous dose after  2 days of finishing linezolid    lactobacillus Pack Take 1 packet (1 g total) by mouth 3 (three) times daily with meals for 7 days.   LORazepam  0.5 MG tablet Commonly known as: ATIVAN  Take 1 tablet (0.5 mg total) by mouth every 8 (eight) hours as needed for anxiety.   oxyCODONE  5 MG immediate release tablet Commonly known as: Oxy IR/ROXICODONE  Take 1 tablet (5 mg total) by mouth every 6 (six) hours as needed for severe pain (pain score 7-10).   pantoprazole  40 MG tablet Commonly known as: PROTONIX  Take 1 tablet (40 mg total) by mouth daily. What changed:  when to take this additional instructions   potassium chloride  10 MEQ tablet Commonly known as: KLOR-CON  TAKE TWO TABLETS DAILY AS DIRECTED         The results of significant diagnostics from this hospitalization (including imaging, microbiology, ancillary and laboratory) are listed below for reference.    Procedures and Diagnostic Studies:   CT Angio Chest PE W and/or Wo Contrast Result Date: 10/06/2023 CLINICAL DATA:  Breast cancer, fever, chills, pulmonary embolism, dyspnea, sepsis. * Tracking Code: BO * EXAM: CT ANGIOGRAPHY CHEST CT ABDOMEN AND PELVIS WITH CONTRAST TECHNIQUE: Multidetector CT imaging of the chest was performed using the standard protocol during bolus administration of intravenous contrast. Multiplanar CT image reconstructions and MIPs were obtained to evaluate the vascular anatomy. Multidetector CT imaging of the abdomen and pelvis was performed using the standard protocol during bolus administration of intravenous contrast. RADIATION DOSE REDUCTION: This exam was performed according to the departmental dose-optimization program which includes automated exposure control, adjustment of the mA and/or kV according to patient size and/or use of iterative reconstruction technique. CONTRAST:  OMNIPAQUE  IOHEXOL  350 MG/ML SOLN COMPARISON:  08/23/2023 FINDINGS: CTA CHEST FINDINGS  Cardiovascular: Borderline cardiomegaly. No significant coronary artery calcification. No pericardial effusion. Adequate opacification of the pulmonary arterial tree. No intraluminal filling defect identified to suggest acute pulmonary embolism. The thoracic aorta is unremarkable. Right internal jugular chest port tip seen within the superior vena cava. Mediastinum/Nodes: Shotty right paratracheal adenopathy appears stable since prior examination. No frankly pathologic thoracic adenopathy identified. The esophagus is unremarkable. Visualized thyroid  unremarkable. Lungs/Pleura: Mosaic attenuation of pulmonary parenchyma relates to multifocal air trapping, likely related to small airways disease. No confluent pulmonary infiltrate. No pneumothorax or pleural effusion. No central obstructing lesion. Musculoskeletal: No acute bone abnormality. No lytic or blastic bone lesion. Bilateral mastectomy has been performed with punctate subcutaneous gas noted within the right chest wall operative bed but improving subcutaneous edema when compared to prior examination and resolution of the cavity involving the left anterior chest wall. Review of the MIP images confirms the above findings. CT ABDOMEN and PELVIS FINDINGS Hepatobiliary: No focal liver abnormality is seen. No gallstones, gallbladder wall thickening, or biliary dilatation. Pancreas: Unremarkable Spleen: Stable mild splenomegaly with the spleen measuring 15.2 cm in greatest dimension. No intrasplenic lesion identified. Adrenals/Urinary Tract: Adrenal glands are unremarkable. Kidneys are normal, without renal calculi, focal lesion, or hydronephrosis. Bladder is unremarkable. Stomach/Bowel: Mild sigmoid diverticulosis. Stomach, small bowel, and large bowel are otherwise unremarkable. Appendix normal. No evidence of obstruction or focal inflammation. No free intraperitoneal gas or fluid. Vascular/Lymphatic: No significant vascular findings are present. No enlarged  abdominal or pelvic lymph nodes. Reproductive: Uterus and bilateral adnexa are unremarkable. Other: Tiny fat containing umbilical hernia Musculoskeletal: No acute bone abnormality. No lytic or blastic bone lesion. Osseous structures are age appropriate. Review of the MIP images confirms the above findings. IMPRESSION: 1. No acute pulmonary embolism. No acute intrathoracic findings. 2. Mosaic attenuation of the pulmonary parenchyma most in keeping with multifocal air trapping, likely related to small airways disease. 3. No acute intra-abdominal pathology identified. No evidence of metastatic disease within the abdomen and pelvis. 4. Stable mild splenomegaly Electronically Signed   By: Worthy Heads M.D.   On: 10/06/2023 01:18   CT ABDOMEN PELVIS W CONTRAST Result Date: 10/06/2023 CLINICAL DATA:  Breast cancer, fever, chills, pulmonary embolism, dyspnea, sepsis. * Tracking Code: BO * EXAM: CT ANGIOGRAPHY CHEST CT ABDOMEN AND PELVIS WITH CONTRAST TECHNIQUE: Multidetector CT imaging of the chest was performed using the standard protocol during bolus administration of intravenous contrast. Multiplanar CT image reconstructions and MIPs were obtained to evaluate the vascular anatomy. Multidetector CT imaging of the abdomen and pelvis was performed using the standard protocol during bolus administration of intravenous contrast. RADIATION DOSE REDUCTION: This exam was performed according to the departmental dose-optimization program which includes automated exposure control, adjustment of the mA and/or kV according to patient size and/or use of iterative reconstruction technique. CONTRAST:  OMNIPAQUE  IOHEXOL  350 MG/ML SOLN COMPARISON:  08/23/2023 FINDINGS: CTA CHEST FINDINGS Cardiovascular: Borderline cardiomegaly. No significant coronary artery calcification. No pericardial effusion. Adequate opacification of the pulmonary arterial tree. No intraluminal filling defect identified to suggest acute pulmonary  embolism. The thoracic aorta is unremarkable. Right internal jugular chest port tip seen within the superior vena cava. Mediastinum/Nodes: Shotty right paratracheal adenopathy appears stable since prior examination. No frankly pathologic thoracic adenopathy identified. The esophagus is unremarkable. Visualized thyroid  unremarkable. Lungs/Pleura: Mosaic attenuation of pulmonary parenchyma relates to multifocal air trapping, likely related to small airways disease. No confluent pulmonary infiltrate. No pneumothorax or pleural effusion. No central obstructing lesion. Musculoskeletal: No acute bone abnormality. No  lytic or blastic bone lesion. Bilateral mastectomy has been performed with punctate subcutaneous gas noted within the right chest wall operative bed but improving subcutaneous edema when compared to prior examination and resolution of the cavity involving the left anterior chest wall. Review of the MIP images confirms the above findings. CT ABDOMEN and PELVIS FINDINGS Hepatobiliary: No focal liver abnormality is seen. No gallstones, gallbladder wall thickening, or biliary dilatation. Pancreas: Unremarkable Spleen: Stable mild splenomegaly with the spleen measuring 15.2 cm in greatest dimension. No intrasplenic lesion identified. Adrenals/Urinary Tract: Adrenal glands are unremarkable. Kidneys are normal, without renal calculi, focal lesion, or hydronephrosis. Bladder is unremarkable. Stomach/Bowel: Mild sigmoid diverticulosis. Stomach, small bowel, and large bowel are otherwise unremarkable. Appendix normal. No evidence of obstruction or focal inflammation. No free intraperitoneal gas or fluid. Vascular/Lymphatic: No significant vascular findings are present. No enlarged abdominal or pelvic lymph nodes. Reproductive: Uterus and bilateral adnexa are unremarkable. Other: Tiny fat containing umbilical hernia Musculoskeletal: No acute bone abnormality. No lytic or blastic bone lesion. Osseous structures are age  appropriate. Review of the MIP images confirms the above findings. IMPRESSION: 1. No acute pulmonary embolism. No acute intrathoracic findings. 2. Mosaic attenuation of the pulmonary parenchyma most in keeping with multifocal air trapping, likely related to small airways disease. 3. No acute intra-abdominal pathology identified. No evidence of metastatic disease within the abdomen and pelvis. 4. Stable mild splenomegaly Electronically Signed   By: Worthy Heads M.D.   On: 10/06/2023 01:18   DG Chest 2 View Result Date: 10/05/2023 CLINICAL DATA:  Sepsis EXAM: CHEST - 2 VIEW COMPARISON:  None Available. FINDINGS: Lungs are clear. No pneumothorax or pleural effusion. Right internal jugular chest port tip seen within the superior cavoatrial junction. Cardiac size within normal limits. Or vascularity is normal. No acute bone abnormality. Surgical clips noted within the right breast. IMPRESSION: 1. No active cardiopulmonary disease. Electronically Signed   By: Worthy Heads M.D.   On: 10/05/2023 21:58     Labs:   Basic Metabolic Panel: Recent Labs  Lab 10/02/23 1351 10/05/23 2158 10/06/23 0735 10/07/23 0244 10/08/23 0517  NA 142 136 138 135 136  K 4.2 3.6 3.4* 3.5 3.2*  CL 107 103 102 102 102  CO2 29 23 23  20* 23  GLUCOSE 101* 118* 88 98 86  BUN 11 9 7 9 11   CREATININE 0.64 0.75 0.64 0.75 0.62  CALCIUM 9.2 9.1 9.1 8.8* 8.7*  MG  --   --  1.9  --   --    GFR Estimated Creatinine Clearance: 102.2 mL/min (by C-G formula based on SCr of 0.62 mg/dL). Liver Function Tests: Recent Labs  Lab 10/02/23 1351 10/05/23 2158 10/06/23 0735  AST 23 54* 45*  ALT 12 21 20   ALKPHOS 102 116 109  BILITOT 0.3 0.7 0.6  PROT 7.3 7.3 6.5  ALBUMIN 3.8 3.3* 3.0*   Recent Labs  Lab 10/05/23 2158  LIPASE 30   No results for input(s): "AMMONIA" in the last 168 hours. Coagulation profile Recent Labs  Lab 10/05/23 2158  INR 1.0    CBC: Recent Labs  Lab 10/02/23 1215 10/05/23 2158  10/06/23 0936 10/07/23 0244 10/08/23 0517  WBC 6.5 6.5 4.2 4.6 4.0  NEUTROABS 3.7 4.7  --   --   --   HGB 8.9* 9.6* 8.4* 9.0* 8.6*  HCT 28.6* 31.5* 27.8* 29.5* 28.2*  MCV 85.1 88.2 87.1 85.8 86.0  PLT 255 161 120* 128* 105*   Cardiac Enzymes: No results for  input(s): "CKTOTAL", "CKMB", "CKMBINDEX", "TROPONINI" in the last 168 hours. BNP: Invalid input(s): "POCBNP" CBG: No results for input(s): "GLUCAP" in the last 168 hours. D-Dimer No results for input(s): "DDIMER" in the last 72 hours. Hgb A1c No results for input(s): "HGBA1C" in the last 72 hours. Lipid Profile No results for input(s): "CHOL", "HDL", "LDLCALC", "TRIG", "CHOLHDL", "LDLDIRECT" in the last 72 hours. Thyroid  function studies No results for input(s): "TSH", "T4TOTAL", "T3FREE", "THYROIDAB" in the last 72 hours.  Invalid input(s): "FREET3" Anemia work up No results for input(s): "VITAMINB12", "FOLATE", "FERRITIN", "TIBC", "IRON", "RETICCTPCT" in the last 72 hours. Microbiology Recent Results (from the past 240 hours)  Resp panel by RT-PCR (RSV, Flu A&B, Covid) Anterior Nasal Swab     Status: None   Collection Time: 10/05/23 10:37 PM   Specimen: Anterior Nasal Swab  Result Value Ref Range Status   SARS Coronavirus 2 by RT PCR NEGATIVE NEGATIVE Final    Comment: (NOTE) SARS-CoV-2 target nucleic acids are NOT DETECTED.  The SARS-CoV-2 RNA is generally detectable in upper respiratory specimens during the acute phase of infection. The lowest concentration of SARS-CoV-2 viral copies this assay can detect is 138 copies/mL. A negative result does not preclude SARS-Cov-2 infection and should not be used as the sole basis for treatment or other patient management decisions. A negative result may occur with  improper specimen collection/handling, submission of specimen other than nasopharyngeal swab, presence of viral mutation(s) within the areas targeted by this assay, and inadequate number of viral copies(<138  copies/mL). A negative result must be combined with clinical observations, patient history, and epidemiological information. The expected result is Negative.  Fact Sheet for Patients:  BloggerCourse.com  Fact Sheet for Healthcare Providers:  SeriousBroker.it  This test is no t yet approved or cleared by the [place]  FDA and  has been authorized for detection and/or diagnosis of SARS-CoV-2 by FDA under an Emergency Use Authorization (EUA). This EUA will remain  in effect (meaning this test can be used) for the duration of the COVID-19 declaration under Section 564(b)(1) of the Act, 21 U.S.C.section 360bbb-3(b)(1), unless the authorization is terminated  or revoked sooner.       Influenza A by PCR NEGATIVE NEGATIVE Final   Influenza B by PCR NEGATIVE NEGATIVE Final    Comment: (NOTE) The Xpert Xpress SARS-CoV-2/FLU/RSV plus assay is intended as an aid in the diagnosis of influenza from Nasopharyngeal swab specimens and should not be used as a sole basis for treatment. Nasal washings and aspirates are unacceptable for Xpert Xpress SARS-CoV-2/FLU/RSV testing.  Fact Sheet for Patients: BloggerCourse.com  Fact Sheet for Healthcare Providers: SeriousBroker.it  This test is not yet approved or cleared by the [place]  FDA and has been authorized for detection and/or diagnosis of SARS-CoV-2 by FDA under an Emergency Use Authorization (EUA). This EUA will remain in effect (meaning this test can be used) for the duration of the COVID-19 declaration under Section 564(b)(1) of the Act, 21 U.S.C. section 360bbb-3(b)(1), unless the authorization is terminated or revoked.     Resp Syncytial Virus by PCR NEGATIVE NEGATIVE Final    Comment: (NOTE) Fact Sheet for Patients: BloggerCourse.com  Fact Sheet for Healthcare  Providers: SeriousBroker.it  This test is not yet approved or cleared by the [place]  FDA and has been authorized for detection and/or diagnosis of SARS-CoV-2 by FDA under an Emergency Use Authorization (EUA). This EUA will remain in effect (meaning this test can be used) for the duration of the COVID-19 declaration under  Section 564(b)(1) of the Act, 21 U.S.C. section 360bbb-3(b)(1), unless the authorization is terminated or revoked.  Performed at [facility], [address], [place], [place] [address]   Blood culture (routine x 2)     Status: None (Preliminary result)   Collection Time: 10/05/23 11:00 PM   Specimen: BLOOD RIGHT FOREARM  Result Value Ref Range Status   Specimen Description   Final    BLOOD RIGHT FOREARM Performed at [facility], [address], [place], [place] [address]    Special Requests   Final    BOTTLES DRAWN AEROBIC AND ANAEROBIC Blood Culture results may not be optimal due to an inadequate volume of blood received in culture bottles Performed at [facility], [address], [place], [place] [address]    Culture   Final    NO GROWTH 2 DAYS Performed at [facility], [address] [place], [place] [address]    Report Status PENDING  Incomplete  Blood culture (routine x 2)     Status: None (Preliminary result)   Collection Time: 10/06/23  7:35 AM   Specimen: BLOOD RIGHT HAND  Result Value Ref Range Status   Specimen Description   Final    BLOOD RIGHT HAND Performed at [facility], [address], [place], [place] [address]    Special Requests   Final    BOTTLES DRAWN AEROBIC AND ANAEROBIC Blood Culture results may not be optimal due to an inadequate volume of blood received in culture bottles Performed at [facility], [address] [place], [place] [address]    Culture   Final    NO GROWTH 2 DAYS Performed at [facility],  [address], [place], [place] [address]    Report Status PENDING  Incomplete  MRSA Next Gen by PCR, Nasal     Status: None   Collection Time: 10/06/23 12:15 PM   Specimen: Nasal Mucosa; Nasal Swab  Result Value Ref Range Status   MRSA by PCR Next Gen NOT DETECTED NOT DETECTED Final    Comment: (NOTE) The GeneXpert MRSA Assay (FDA approved for NASAL specimens only), is one component of a comprehensive MRSA colonization surveillance program. It is not intended to diagnose MRSA infection nor to guide or monitor treatment for MRSA infections. Test performance is not FDA approved in patients less than 71 years old. Performed at [facility], [address] [place], [place] [address]     Time coordinating discharge: 45 minutes  Signed: Aminta Baldy Jonte Wollam  [facility] 10/08/2023, 2:05 PM

## 2023-10-09 ENCOUNTER — Telehealth: Payer: Self-pay | Admitting: *Deleted

## 2023-10-09 NOTE — Transitions of Care (Post Inpatient/ED Visit) (Signed)
 10/09/2023  Name: Diamond Marshall MRN: [id number] DOB: 11/24/1970  Today's TOC FU Call Status: Today's TOC FU Call Status:: Successful TOC FU Call Completed TOC FU Call Complete Date: 10/09/23 Patient's Name and Date of Birth confirmed.  Transition Care Management Follow-up Telephone Call Date of Discharge: 10/08/23 Discharge Facility: [facility]) Type of Discharge: Inpatient Admission Primary Inpatient Discharge Diagnosis:: SIRS (systemic inflammatory response syndrome How have you been since you were released from the hospital?: Better Any questions or concerns?: No  Items Reviewed: Did you receive and understand the discharge instructions provided?: Yes Medications obtained,verified, and reconciled?: Yes (Medications Reviewed) Any new allergies since your discharge?: No Dietary orders reviewed?: No Do you have support at home?: Yes People in Home [RPT]: spouse Name of Support/Comfort Primary Source: Bambi Lever spouse and friends  Medications Reviewed Today: Medications Reviewed Today     Reviewed by Eilene Grater, RN (Case Manager) on 10/09/23 at 1339  Med List Status: <None>   Medication Order Taking? Sig Documenting Provider Last Dose Status Informant  acetaminophen  (TYLENOL ) 325 MG tablet [id number] Yes Take 2 tablets (650 mg total) by mouth every 6 (six) hours as needed for mild pain (pain score 1-3) (or Fever >/= 101). Sim Dryer, MD Taking Active Self, Pharmacy Records  amoxicillin -clavulanate (AUGMENTIN ) 875-125 MG tablet [id number] Yes Take 1 tablet by mouth 2 (two) times daily for 12 days. Hoyt Macleod, MD Taking Active   carvedilol  (COREG ) 3.125 MG tablet [id number] Yes TAKE ONE TABLET TWICE DAILY WITH MEAL(S) Iruku, Praveena, MD Taking Active Self, Pharmacy Records  cyanocobalamin  (VITAMIN B12) 1000 MCG/ML injection [id number] Yes INJECT 1 ML INTRAMUSCULARLY EVERY 15 DAYS  Patient taking differently: Inject 1,000 mcg into the skin every 30 (thirty) days.    Gudena, Vinay, MD Taking Active Self, Pharmacy Records  cycloSPORINE  (RESTASIS ) 0.05 % ophthalmic emulsion [id number] Yes Place 1 drop into both eyes 2 (two) times daily. Murleen Arms, MD Taking Active Self, Pharmacy Records  doxycycline  (VIBRA -TABS) 100 MG tablet [id number] Yes Take 1 tablet (100 mg total) by mouth 2 (two) times daily for 5 days. Hoyt Macleod, MD Taking Active   EPINEPHrine  0.3 mg/0.3 mL IJ SOAJ injection [id number] Yes Inject 0.3 mg into the muscle as needed for anaphylaxis. Zorita Hiss, FNP Taking Active Self, Pharmacy Records           Med Note [facility], Lavonia Powers   Thu Aug 23, 2023  4:47 PM)    escitalopram  (LEXAPRO ) 20 MG tablet [id number] Yes Take 1 tablet (20 mg total) by mouth daily. Please take citalopram 10 mg( half pill) only for next 3 days starting from tomorrow.  Then stop it until you finish the course of linezolid .  Can resume previous dose after  2 days of finishing linezolid  Leona Rake, MD Taking Active Self, Pharmacy Records  lactobacillus (FLORANEX/LACTINEX) PACK [id number] Yes Take 1 packet (1 g total) by mouth 3 (three) times daily with meals for 7 days. Hoyt Macleod, MD Taking Active   LORazepam  (ATIVAN ) 0.5 MG tablet [id number] Yes Take 1 tablet (0.5 mg total) by mouth every 8 (eight) hours as needed for anxiety. Murleen Arms, MD Taking Active Self, Pharmacy Records  oxyCODONE  (OXY IR/ROXICODONE ) 5 MG immediate release tablet [id number] No Take 1 tablet (5 mg total) by mouth every 6 (six) hours as needed for severe pain (pain score 7-10).  Patient not taking: Reported on 10/06/2023   Sim Dryer, MD Not Taking Active Self, Pharmacy Records  pantoprazole  (PROTONIX ) 40 MG tablet [id number]  Yes Take 1 tablet (40 mg total) by mouth daily.  Patient taking differently: Take 40 mg by mouth See admin instructions. Take 40 mg by mouth in the morning 30 minutes before breakfast and an additional 40 mg in the evening as needed for unresolved acid reflux symptoms    Roise Cleaver, MD Taking Active Self, Pharmacy Records  potassium chloride  (KLOR-CON ) 10 MEQ tablet [id number] Yes TAKE TWO TABLETS DAILY AS DIRECTED Iruku, Praveena, MD Taking Active Self, Pharmacy Records            Home Care and Equipment/Supplies: Were Home Health Services Ordered?: NA Any new equipment or medical supplies ordered?: NA  Functional Questionnaire: Do you need assistance with bathing/showering or dressing?: No Do you need assistance with meal preparation?: No Do you need assistance with eating?: No Do you have difficulty maintaining continence: No Do you need assistance with getting out of bed/getting out of a chair/moving?: No Do you have difficulty managing or taking your medications?: No  Follow up appointments reviewed: PCP Follow-up appointment confirmed?: No MD Provider Line [phone] Given: No (Patient has put a call in to PCP and awaiting there call back with appointment date and time.) Specialist Hospital Follow-up appointment confirmed?: Yes Date of Specialist follow-up appointment?: 10/23/23 Follow-Up Specialty Provider:: Dr Praveena Do you need transportation to your follow-up appointment?: No Do you understand care options if your condition(s) worsen?: Yes-patient verbalized understanding  SDOH Interventions Today    Flowsheet Row Most Recent Value  SDOH Interventions   Food Insecurity Interventions Intervention Not Indicated  Housing Interventions Intervention Not Indicated  Transportation Interventions Intervention Not Indicated  Utilities Interventions Intervention Not Indicated       Una Ganser BSN RN [facility] Health Care Management Coordinator [email or web address] Direct Dial: [phone]  Fax: [phone] Website: [email or web address]

## 2023-10-11 LAB — CULTURE, BLOOD (ROUTINE X 2)
Culture: NO GROWTH
Culture: NO GROWTH

## 2023-10-12 ENCOUNTER — Ambulatory Visit (HOSPITAL_COMMUNITY): Admission: RE | Admit: 2023-10-12 | Source: Ambulatory Visit

## 2023-10-22 ENCOUNTER — Telehealth: Payer: Self-pay

## 2023-10-22 NOTE — Telephone Encounter (Signed)
 Verbally confirmed appts for 6/3

## 2023-10-23 ENCOUNTER — Telehealth: Payer: Self-pay | Admitting: *Deleted

## 2023-10-23 ENCOUNTER — Inpatient Hospital Stay: Attending: Hematology and Oncology

## 2023-10-23 ENCOUNTER — Ambulatory Visit: Attending: Adult Health | Admitting: Physical Therapy

## 2023-10-23 ENCOUNTER — Other Ambulatory Visit: Payer: Self-pay | Admitting: *Deleted

## 2023-10-23 ENCOUNTER — Inpatient Hospital Stay

## 2023-10-23 ENCOUNTER — Inpatient Hospital Stay (HOSPITAL_BASED_OUTPATIENT_CLINIC_OR_DEPARTMENT_OTHER): Admitting: Hematology and Oncology

## 2023-10-23 ENCOUNTER — Encounter: Payer: Self-pay | Admitting: Hematology and Oncology

## 2023-10-23 ENCOUNTER — Other Ambulatory Visit: Payer: Self-pay

## 2023-10-23 ENCOUNTER — Encounter: Payer: Self-pay | Admitting: Physical Therapy

## 2023-10-23 VITALS — BP 120/64 | HR 83 | Temp 98.2°F | Resp 18 | Wt 275.0 lb

## 2023-10-23 DIAGNOSIS — Z483 Aftercare following surgery for neoplasm: Secondary | ICD-10-CM | POA: Insufficient documentation

## 2023-10-23 DIAGNOSIS — I10 Essential (primary) hypertension: Secondary | ICD-10-CM | POA: Insufficient documentation

## 2023-10-23 DIAGNOSIS — C50212 Malignant neoplasm of upper-inner quadrant of left female breast: Secondary | ICD-10-CM | POA: Insufficient documentation

## 2023-10-23 DIAGNOSIS — Z95828 Presence of other vascular implants and grafts: Secondary | ICD-10-CM

## 2023-10-23 DIAGNOSIS — Z170 Estrogen receptor positive status [ER+]: Secondary | ICD-10-CM

## 2023-10-23 DIAGNOSIS — M25612 Stiffness of left shoulder, not elsewhere classified: Secondary | ICD-10-CM | POA: Diagnosis present

## 2023-10-23 DIAGNOSIS — M25611 Stiffness of right shoulder, not elsewhere classified: Secondary | ICD-10-CM | POA: Insufficient documentation

## 2023-10-23 DIAGNOSIS — C50412 Malignant neoplasm of upper-outer quadrant of left female breast: Secondary | ICD-10-CM | POA: Diagnosis present

## 2023-10-23 DIAGNOSIS — Z5112 Encounter for antineoplastic immunotherapy: Secondary | ICD-10-CM | POA: Diagnosis present

## 2023-10-23 DIAGNOSIS — Z09 Encounter for follow-up examination after completed treatment for conditions other than malignant neoplasm: Secondary | ICD-10-CM

## 2023-10-23 DIAGNOSIS — Z800 Family history of malignant neoplasm of digestive organs: Secondary | ICD-10-CM | POA: Diagnosis not present

## 2023-10-23 DIAGNOSIS — M6281 Muscle weakness (generalized): Secondary | ICD-10-CM | POA: Insufficient documentation

## 2023-10-23 DIAGNOSIS — Z8041 Family history of malignant neoplasm of ovary: Secondary | ICD-10-CM | POA: Insufficient documentation

## 2023-10-23 DIAGNOSIS — R293 Abnormal posture: Secondary | ICD-10-CM | POA: Insufficient documentation

## 2023-10-23 DIAGNOSIS — Z1721 Progesterone receptor positive status: Secondary | ICD-10-CM | POA: Insufficient documentation

## 2023-10-23 DIAGNOSIS — Z803 Family history of malignant neoplasm of breast: Secondary | ICD-10-CM | POA: Diagnosis not present

## 2023-10-23 DIAGNOSIS — Z1731 Human epidermal growth factor receptor 2 positive status: Secondary | ICD-10-CM | POA: Insufficient documentation

## 2023-10-23 LAB — CMP (CANCER CENTER ONLY)
ALT: 13 U/L (ref 0–44)
AST: 27 U/L (ref 15–41)
Albumin: 3.6 g/dL (ref 3.5–5.0)
Alkaline Phosphatase: 107 U/L (ref 38–126)
Anion gap: 5 (ref 5–15)
BUN: 10 mg/dL (ref 6–20)
CO2: 29 mmol/L (ref 22–32)
Calcium: 9.1 mg/dL (ref 8.9–10.3)
Chloride: 107 mmol/L (ref 98–111)
Creatinine: 0.58 mg/dL (ref 0.44–1.00)
GFR, Estimated: >60 mL/min (ref >60)
Glucose, Bld: 90 mg/dL (ref 70–99)
Potassium: 3.8 mmol/L (ref 3.5–5.1)
Sodium: 141 mmol/L (ref 135–145)
Total Bilirubin: 0.4 mg/dL (ref 0.0–1.2)
Total Protein: 7.8 g/dL (ref 6.5–8.1)

## 2023-10-23 LAB — CBC WITH DIFFERENTIAL (CANCER CENTER ONLY)
Abs Immature Granulocytes: 0.01 10*3/uL (ref 0.00–0.07)
Basophils Absolute: 0.1 10*3/uL (ref 0.0–0.1)
Basophils Relative: 1 %
Eosinophils Absolute: 0.2 10*3/uL (ref 0.0–0.5)
Eosinophils Relative: 3 %
HCT: 28.8 % — ABNORMAL LOW (ref 36.0–46.0)
Hemoglobin: 8.8 g/dL — ABNORMAL LOW (ref 12.0–15.0)
Immature Granulocytes: 0 %
Lymphocytes Relative: 33 %
Lymphs Abs: 2 10*3/uL (ref 0.7–4.0)
MCH: 24.2 pg — ABNORMAL LOW (ref 26.0–34.0)
MCHC: 30.6 g/dL (ref 30.0–36.0)
MCV: 79.1 fL — ABNORMAL LOW (ref 80.0–100.0)
Monocytes Absolute: 0.5 10*3/uL (ref 0.1–1.0)
Monocytes Relative: 7 %
Neutro Abs: 3.5 10*3/uL (ref 1.7–7.7)
Neutrophils Relative %: 56 %
Platelet Count: 279 10*3/uL (ref 150–400)
RBC: 3.64 MIL/uL — ABNORMAL LOW (ref 3.87–5.11)
RDW: 17.6 % — ABNORMAL HIGH (ref 11.5–15.5)
WBC Count: 6.2 10*3/uL (ref 4.0–10.5)
nRBC: 0 % (ref 0.0–0.2)

## 2023-10-23 MED ORDER — DIPHENHYDRAMINE HCL 25 MG PO CAPS
50.0000 mg | ORAL_CAPSULE | Freq: Once | ORAL | Status: AC
  Administered 2023-10-23: 50 mg via ORAL
  Filled 2023-10-23: qty 2

## 2023-10-23 MED ORDER — SODIUM CHLORIDE 0.9 % IV SOLN
3.6000 mg/kg | Freq: Once | INTRAVENOUS | Status: AC
  Administered 2023-10-23: 480 mg via INTRAVENOUS
  Filled 2023-10-23: qty 24

## 2023-10-23 MED ORDER — SODIUM CHLORIDE 0.9% FLUSH
10.0000 mL | Freq: Once | INTRAVENOUS | Status: AC
  Administered 2023-10-23: 10 mL

## 2023-10-23 MED ORDER — ACETAMINOPHEN 325 MG PO TABS
650.0000 mg | ORAL_TABLET | Freq: Once | ORAL | Status: AC
  Administered 2023-10-23: 650 mg via ORAL
  Filled 2023-10-23: qty 2

## 2023-10-23 MED ORDER — SODIUM CHLORIDE 0.9 % IV SOLN: INTRAVENOUS | Status: DC

## 2023-10-23 MED ORDER — PROCHLORPERAZINE MALEATE 10 MG PO TABS
10.0000 mg | ORAL_TABLET | Freq: Once | ORAL | Status: AC
  Administered 2023-10-23: 10 mg via ORAL
  Filled 2023-10-23: qty 1

## 2023-10-23 NOTE — Progress Notes (Signed)
 Pt observed for 30 min post Kadcyla  infusion. Tolerated well. No adverse s/s. VSS. Pt ambulated independently to lobby for d/c. Declined AVS.

## 2023-10-23 NOTE — Assessment & Plan Note (Signed)
 Assessment and Plan Assessment & Plan Immune response to Kadcyla  Recent immune response to Kadcyla  with fever and chills? . Differential diagnosis excluded sepsis and MRSA.  Discussed recurrence risk and need for hospital evaluation if fever recurs. - Advise hospital evaluation if fever recurs, unfortunately she is a high risk for infection. - Contact healthcare provider if fever occurs during the week for guidance.  Joint pain New onset joint pain likely due to Kadcyla , affecting multiple joints and mobility. Currently managed with Advil  and Tylenol . - Advise use of Advil  or Tylenol  for pain management, up to twice daily. - Consider dose modification of Kadcyla  if joint pain persists.

## 2023-10-23 NOTE — Patient Instructions (Signed)
 Over Head Pull: Narrow and Wide Grip   Cancer Rehab [phone]   On back, knees bent, feet flat, band across thighs, elbows straight but relaxed. Pull hands apart (start). Keeping elbows straight, bring arms up and over head, hands toward floor. Keep pull steady on band. Hold momentarily. Return slowly, keeping pull steady, back to start. Then do same with a wider grip on the band (past shoulder width) Repeat _5-10__ times. Band color __yellow____   Side Pull: Double Arm   On back, knees bent, feet flat. Arms perpendicular to body, shoulder level, elbows straight but relaxed. Pull arms out to sides, elbows straight. Resistance band comes across collarbones, hands toward floor. Hold momentarily. Slowly return to starting position. Repeat _5-10__ times. Band color _yellow____   Sword   On back, knees bent, feet flat, left hand on left hip, right hand above left. Pull right arm DIAGONALLY (hip to shoulder) across chest. Bring right arm along head toward floor. Thumb is pointed down when by opposite hip and rotates upwards when by head.  Hold momentarily. Slowly return to starting position. Repeat _5-10__ times. Do with left arm. Band color _yellow_____   Shoulder Rotation: Double Arm   On back, knees bent, feet flat, elbows tucked at sides, bent 90, hands palms up. Pull hands apart and down toward floor, keeping elbows near sides. Hold momentarily. Slowly return to starting position. Repeat _5-10__ times. Band color __yellow____

## 2023-10-23 NOTE — Patient Instructions (Signed)
 [facility] - A DEPT OF [facility]  Discharge Instructions: Thank you for choosing [facility] to provide your oncology and hematology care.   If you have a lab appointment with the Cancer Center, please go directly to the Cancer Center and check in at the registration area.   Wear comfortable clothing and clothing appropriate for easy access to any Portacath or PICC line.   We strive to give you quality time with your provider. You may need to reschedule your appointment if you arrive late (15 or more minutes).  Arriving late affects you and other patients whose appointments are after yours.  Also, if you miss three or more appointments without notifying the office, you may be dismissed from the clinic at the provider's discretion.      For prescription refill requests, have your pharmacy contact our office and allow 72 hours for refills to be completed.    Today you received the following chemotherapy and/or immunotherapy agents: Kadcyla      To help prevent nausea and vomiting after your treatment, we encourage you to take your nausea medication as directed.  BELOW ARE SYMPTOMS THAT SHOULD BE REPORTED IMMEDIATELY: *FEVER GREATER THAN 100.4 F (38 C) OR HIGHER *CHILLS OR SWEATING *NAUSEA AND VOMITING THAT IS NOT CONTROLLED WITH YOUR NAUSEA MEDICATION *UNUSUAL SHORTNESS OF BREATH *UNUSUAL BRUISING OR BLEEDING *URINARY PROBLEMS (pain or burning when urinating, or frequent urination) *BOWEL PROBLEMS (unusual diarrhea, constipation, pain near the anus) TENDERNESS IN MOUTH AND THROAT WITH OR WITHOUT PRESENCE OF ULCERS (sore throat, sores in mouth, or a toothache) UNUSUAL RASH, SWELLING OR PAIN  UNUSUAL VAGINAL DISCHARGE OR ITCHING   Items with * indicate a potential emergency and should be followed up as soon as possible or go to the Emergency Department if any problems should occur.  Please show the CHEMOTHERAPY ALERT CARD or IMMUNOTHERAPY  ALERT CARD at check-in to the Emergency Department and triage nurse.  Should you have questions after your visit or need to cancel or reschedule your appointment, please contact [facility] - A DEPT OF [facility]  Dept: [phone]  and follow the prompts.  Office hours are 8:00 a.m. to 4:30 p.m. Monday - Friday. Please note that voicemails left after 4:00 p.m. may not be returned until the following business day.  We are closed weekends and major holidays. You have access to a nurse at all times for urgent questions. Please call the main number to the clinic Dept: [phone] and follow the prompts.   For any non-urgent questions, you may also contact your provider using MyChart. We now offer e-Visits for anyone 29 and older to request care online for non-urgent symptoms. For details visit [email or web address].   Also download the MyChart app! Go to the app store, search "MyChart", open the app, select , and log in with your MyChart username and password.

## 2023-10-23 NOTE — Therapy (Addendum)
 OUTPATIENT PHYSICAL THERAPY  UPPER EXTREMITY ONCOLOGY EVALUATION  Patient Name: Diamond Marshall MRN: [id number] DOB:[birth year], 53 y.o., female Today's Date: 10/23/2023  END OF SESSION:  PT End of Session - 10/23/23 1642     Visit Number 1    Number of Visits 4    Date for PT Re-Evaluation 01/15/24    PT Start Time 1602    PT Stop Time 1641    PT Time Calculation (min) 39 min    Activity Tolerance Patient tolerated treatment well    Behavior During Therapy WFL for tasks assessed/performed             Past Medical History:  Diagnosis Date   Abdominal hernia    Anemia    b12 deficiency   Anxiety    Arthritis    B12 deficiency    Back pain    Cancer (HCC)    breast cancer   Depression    Family history of adverse reaction to anesthesia    mother vomits after anesthesia   Gallbladder problem    GERD (gastroesophageal reflux disease)    History of hiatal hernia    small per patient   Hypertension    Hypothyroidism    Joint pain    Knee pain    Palpitation    when iron is low   Pernicious anemia    SOB (shortness of breath)    Thyroid  disease    hypothyroidism   Vitamin D  deficiency    Past Surgical History:  Procedure Laterality Date   BREAST BIOPSY Left 06/09/2022   US  LT BREAST BX W LOC DEV 1ST LESION IMG BX SPEC US  GUIDE 06/09/2022 [facility]   CESAREAN SECTION  1999   Dilatation and currettagement     for miscarriage 18 years ago   PORTACATH PLACEMENT Right 07/05/2022   Procedure: INSERTION PORT-A-CATH;  Surgeon: [name], MD;  Location: [facility] OR;  Service: General;  Laterality: Right;   SIMPLE MASTECTOMY WITH AXILLARY SENTINEL NODE BIOPSY Bilateral 07/26/2023   Procedure: LEFT SKIN REDUCING MASTECTOMY, RIGHT SKIN SPARING, RISK REDUCING MASTECTOMY;  Surgeon: [name], MD;  Location: [facility] OR;  Service: General;  Laterality: Bilateral;  PEC BLOCK   Patient Active Problem List   Diagnosis Date Noted   SIRS (systemic inflammatory response  syndrome) (HCC) 10/06/2023   Sepsis due to cellulitis (HCC) 08/23/2023   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   Breast cancer, stage 4 (HCC) 07/26/2023   Gastroenteritis 09/29/2022   Port-A-Cath in place 07/07/2022   Genetic testing 06/29/2022   Malignant neoplasm of upper-inner quadrant of left breast in female, estrogen receptor positive (HCC) 06/19/2022   Chronic pain of right knee 03/27/2022   Essential hypertension 03/27/2022   Positive self-administered antigen test for COVID-19 03/15/2021   Prediabetes 07/22/2019   Anxiety and depression 03/15/2016   Vitamin D  deficiency 03/15/2016   Metabolic syndrome X 08/07/2013   Elevated random blood glucose level 08/07/2013   Hypertriglyceridemia 08/07/2013   ADD (attention deficit disorder) 07/24/2013    hyperlipidemia 10/09/2012   Morbid obesity (HCC) 10/09/2012   Hypothyroidism 10/09/2012   B12 deficiency 10/09/2012   Anemia, unspecified 02/12/2011   Dysthymic disorder 02/12/2011   Migraine headache 02/12/2011   Obsessive-compulsive disorder 02/12/2011   Tinnitus 02/12/2011    PCP: Butler Der, MD  REFERRING PROVIDER: Crawford Morna Pickle, NP  REFERRING DIAG: C50.212,Z17.0 (ICD-10-CM) - Malignant neoplasm of upper-inner quadrant of left breast in female, estrogen receptor positive (HCC)  THERAPY DIAG:  Stiffness of  right shoulder, not elsewhere classified  Stiffness of left shoulder, not elsewhere classified  Muscle weakness (generalized)  Aftercare following surgery for neoplasm  Abnormal posture  Malignant neoplasm of upper-outer quadrant of left breast in female, estrogen receptor positive (HCC)  ONSET DATE: 07/26/23  Rationale for Evaluation and Treatment: Rehabilitation  SUBJECTIVE:                                                                                                                                                                                           SUBJECTIVE STATEMENT: I am having  trouble with my ROM. I am trying to save my time off for [employer] so I can not come for many visits.   PERTINENT HISTORY: Patient was diagnosed with inflammatory breast cancer in March and underwent bilateral mastectomy by Dr. Vanderbilt on 07/26/23.  She has subsequently had MRSA infection of the right mastectomy site and sepsis requiring hospitalization.  Also reports wound VAC treatment of the left mastectomy site due to tissue necrosis. She had a seroma on the R. She completed neoadjuvant and is now currently doing chemotherapy  PAIN:  Are you having pain? No  PRECAUTIONS: Other: none  RED FLAGS: None   WEIGHT BEARING RESTRICTIONS: No  FALLS:  Has patient fallen in last 6 months? 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  COGNITION: Overall cognitive status: Within functional limits for tasks assessed   PALPATION: increased scar tissue palpable bilaterally  OBSERVATIONS / OTHER ASSESSMENTS: scab intact on L mastectomy scar and small opening still present at mid R mastectomy scar  POSTURE: forward head, rounded shoulders  UPPER EXTREMITY AROM/PROM:  A/PROM RIGHT   eval   Shoulder extension 47  Shoulder flexion 171  Shoulder abduction 155  Shoulder internal rotation 65  Shoulder external rotation 90    (Blank rows = not tested)  A/PROM LEFT   eval  Shoulder extension 47  Shoulder flexion 175  Shoulder abduction 169  Shoulder internal rotation 21 with pain  Shoulder external rotation 90    (Blank rows = not tested  TREATMENT DATE:  10/23/23:  Therapeutic activity In supine: supine flexion and bilateral shoulder abduction with dowel with v/c to keep scapular muscles engaged throughout movement to decrease pain and improve activation x 5 reps each. Supine scapular series with yellow band x 5 reps each as follows with v/c and t/c for pt to perform correctly with focus on cueing for scapular activation: narrow and wide grip flexion, horizontal abduction, ER, diagonals    PATIENT EDUCATION:  Education details: supine scapular series, dowel stretches, importance of scapular muscle activation and posture to decrease pain Person educated: Patient Education method: Programmer, multimedia, Demonstration, Tactile cues, Verbal cues, and Handouts Education comprehension: verbalized understanding, returned demonstration, verbal cues required, and tactile cues required  HOME EXERCISE PROGRAM: Supine scapular series Dowel flexion and abduction  ASSESSMENT:  CLINICAL IMPRESSION: Patient is a 53 y.o. female who was seen today for physical therapy evaluation and treatment for decreased bilateral shoulder ROM, muscle weakness and abnormal posture. Pt underwent bilateral mastectomies in March 2025. She has several hospitalizations for MRSA and sepsis. She has decreased ROM bilaterally and has pain when reaching overhead and getting boxes at work. She has pain with any resistance overhead. Educated pt how posture effects the joint and can cause pain and irritation. Focused on improving glenohumeral rhythm. Instructed pt today in ROM and strengthening exercises and issued these as part of pt's HEP. She would only like to come every 3 weeks on the same day as her infusion as she is trying to save her time off. She would benefit from skilled PT services to improve bilateral shoulder ROM, strength, and improve posture.     OBJECTIVE  IMPAIRMENTS: decreased ROM, decreased strength, impaired UE functional use, postural dysfunction, and pain.   ACTIVITY LIMITATIONS: lifting and reach over head  PARTICIPATION LIMITATIONS: meal prep, cleaning, laundry, community activity, and occupation  PERSONAL FACTORS: Time since onset of injury/illness/exacerbation are also affecting patient's functional outcome.   REHAB POTENTIAL: Good  CLINICAL DECISION MAKING: Evolving/moderate complexity  EVALUATION COMPLEXITY: Moderate  GOALS: Goals reviewed with patient? Yes  SHORT TERM GOALS= LONG TERM GOALS Target date: 01/15/24  Pt will be able to reach overhead to get an object from a shelf and lower it without pain.  Baseline: Goal status: INITIAL  2.  Pt will demonstrate 170 degrees of R shoulder abduction to allow her to reach out to the side.  Baseline:  Goal status: INITIAL  3.  Pt will be independent in a home exercise program for continued stretching and strengthening.  Baseline:  Goal status: INITIAL  4.  Pt will report a 75% improvement in feeling of tightness across chest to allow improved comfort.  Baseline:  Goal status: INITIAL  PLAN:  PT FREQUENCY: every 3 weeks  PT DURATION: 12 weeks  PLANNED INTERVENTIONS: [id number]- PT Re-evaluation, 97110-Therapeutic exercises, 97530- Therapeutic activity, 97112- Neuromuscular re-education, 97535- Self Care, [id number]- Manual therapy, Patient/Family education, Balance training, Joint mobilization, Therapeutic exercises, Therapeutic activity, Neuromuscular re-education, Gait training, and Self Care  PLAN FOR NEXT SESSION: assess goals, instruct in strength ABC if pain has improved, remeasure ROM  [employer], PT 10/23/2023, 4:57 PM  PHYSICAL THERAPY DISCHARGE SUMMARY  Visits from Start of Care: 1  Current functional level related to goals / functional outcomes: See above - per phone call with patient she is no longer having any ROM issues   Remaining deficits: Per  phone call with patient - none   Education / Equipment: HEP   Patient agrees to discharge. Patient  goals were not met - pt did not return to re assess goals. Patient is being discharged due to the patient's request.

## 2023-10-23 NOTE — Telephone Encounter (Signed)
 Proceed with treatment as planned per last ECHO in Feb 2025- of note pt's therapy was on hold for several treatments.

## 2023-10-23 NOTE — Progress Notes (Signed)
 [facility] Cancer Follow up:    Diamond Cleaver, MD [address] [place] [address]   DIAGNOSIS:  Cancer Staging  Malignant neoplasm of upper-inner quadrant of left breast in female, estrogen receptor positive (HCC) Staging form: Breast, AJCC 8th Edition - Clinical stage from 06/21/2022: Stage IV (cT3, cN3, cM1, G3, ER+, PR+, HER2+) - Signed by Murleen Arms, MD on 09/17/2023 Stage prefix: Initial diagnosis Histologic grading system: 3 grade system Stage used in treatment planning: Yes National guidelines used in treatment planning: Yes Type of national guideline used in treatment planning: NCCN    SUMMARY OF ONCOLOGIC HISTORY: Oncology History  Malignant neoplasm of upper-inner quadrant of left breast in female, estrogen receptor positive (HCC)  06/06/2022 Mammogram   Diagnostic mammogram given palpable mass showed suspicious spiculated left breast mass with surrounding nodularity at 9 o'clock position.  This measures up to 3.1 cm sonographically however due to deep location and patient body habitus the mammographic measurement of 5.8 cm is felt to be more accurate.  At least 2 abnormal lymph nodes in the left axilla.  Possible abnormal palpable left supraclavicular lymph node.   06/06/2022 Breast US    Breast ultrasound confirmed these findings.   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  Prognostic showed ER 65% weak to strong staining, PR 15% weak to moderate staining, Ki-67 of 50% and HER2 positive by IHC at 3+    Genetic Testing   [facility] Custom Panel+RNA was Negative. Report date is 06/28/2022.   The Custom Hereditary Cancers Panel offered by [facility] includes sequencing and/or deletion duplication testing of the following 43 genes: APC, ATM, AXIN2, BAP1, BARD1, BMPR1A, BRCA1, BRCA2, BRIP1,  CDH1, CDK4, CDKN2A (p14ARF and p16INK4a only), CHEK2, CTNNA1, EPCAM (Deletion/duplication testing only), FH, GREM1 (promoter region duplication testing only), HOXB13, KIT, MBD4, MEN1, MLH1, MSH2, MSH3, MSH6, MUTYH, NF1, NHTL1, PALB2, PDGFRA, PMS2, POLD1, POLE, PTEN, RAD51C, RAD51D, SMAD4, SMARCA4. STK11, TP53, TSC1, TSC2, and VHL.    06/21/2022 Cancer Staging   Staging form: Breast, AJCC 8th Edition - Clinical stage from 06/21/2022: Stage IV (cT3, cN3, cM1, G3, ER+, PR+, HER2+) - Signed by Murleen Arms, MD on 09/17/2023 Stage prefix: Initial diagnosis Histologic grading system: 3 grade system Stage used in treatment planning: Yes National guidelines used in treatment planning: Yes Type of national guideline used in treatment planning: NCCN   06/29/2022 PET scan   IMPRESSION: 1. Hypermetabolic left breast mass, compatible with primary breast malignancy. 2. Hypermetabolic left cervical, axillary, subpectoral, supraclavicular and prevascular mediastinal lymph nodes, compatible with nodal metastatic disease. 3. No evidence of metastatic disease in the abdomen or pelvis. 4. Aortic Atherosclerosis (ICD10-I70.0).     Electronically Signed   By: Avelino Lek M.D.   On: 06/29/2022 09:32   07/11/2022 - 01/16/2023 Chemotherapy   Patient is on Treatment Plan : BREAST DOCEtaxel  + Trastuzumab  + Pertuzumab  (THP) q21d x 8 cycles / Trastuzumab  + Pertuzumab  q21d x 4 cycles     02/01/2023 - 06/29/2023 Chemotherapy   Patient is on Treatment Plan : BREAST METASTATIC Fam-Trastuzumab Deruxtecan-nxki  (Enhertu ) (5.4) q21d     10/02/2023 -  Chemotherapy   Patient is on Treatment Plan : BREAST ADO-Trastuzumab Emtansine  (Kadcyla ) q21d       CURRENT THERAPY: Kadcyla   INTERVAL HISTORY:  Diamond Marshall 53 y.o. female returns for follow-up and evaluation  while on kadcyla .  Discussed the use of AI scribe software for clinical note transcription with the patient, who gave verbal consent to proceed.  History of  Present Illness Diamond Marshall is a 53 year old female who presents with joint pain and a history of fever following kadcyla  treatment.  She received kadcyla  treatment on Tuesday and felt well until Friday when she experienced chills and a fever ranging from 100 to 102 degrees Fahrenheit. She was hospitalized for four days where sepsis and MRSA were ruled out. All tests performed during her hospitalization returned negative results. Since discharge from the hospital, she has not experienced any further fevers or chills.  She has persistent joint pain affecting all her joints, which she attributes to the medication. She takes Advil  or Tylenol  PM, usually at night, to help with sleep and manage the pain. Advil  seems to be more effective than Tylenol .  Her port is looking okay and her breast is mostly healed, though there is a minor open area that does not require dressing. She has started physical therapy and is increasing her physical activity, including walking more during the day and at stores instead of using a scooter. She also cleaned her house recently, indicating improved energy levels despite the joint pain.  She inquires about dental work, specifically needing two fillings replaced, and asks about the occasional consumption of wine, which she defines as once or twice a month.  No current fevers or chills since her hospital discharge.    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   Breast cancer, stage 4 (HCC) 07/26/2023   Gastroenteritis 09/29/2022   Port-A-Cath in place 07/07/2022   Genetic testing 06/29/2022   Malignant neoplasm of upper-inner quadrant of left breast in female, estrogen receptor positive (HCC) 06/19/2022   Chronic pain of right knee 03/27/2022   Essential hypertension 03/27/2022   Positive  self-administered antigen test for COVID-19 03/15/2021   Prediabetes 07/22/2019   Anxiety and depression 03/15/2016   Vitamin D  deficiency 03/15/2016   Metabolic syndrome X 08/07/2013   Elevated random blood glucose level 08/07/2013   Hypertriglyceridemia 08/07/2013   ADD (attention deficit disorder) 07/24/2013    hyperlipidemia 10/09/2012   Morbid obesity (HCC) 10/09/2012   Hypothyroidism 10/09/2012   B12 deficiency 10/09/2012   Anemia, unspecified 02/12/2011   Dysthymic disorder 02/12/2011   Migraine headache 02/12/2011   Obsessive-compulsive disorder 02/12/2011   Tinnitus 02/12/2011    is allergic to phentermine , bee venom, lactose intolerance (gi), peanut allergen powder-dnfp, topiramate , sulfa antibiotics, tape, and wound dressing adhesive.  MEDICAL HISTORY: Past Medical History:  Diagnosis Date   Abdominal hernia    Anemia    b12 deficiency   Anxiety    Arthritis    B12 deficiency    Back pain    Cancer (HCC)    breast cancer   Depression    Family history of adverse reaction to anesthesia    mother vomits after anesthesia   Gallbladder problem    GERD (gastroesophageal reflux disease)    History of hiatal hernia    "small" per patient   Hypertension    Hypothyroidism    Joint pain    Knee pain    Palpitation    "when iron is low"   Pernicious anemia    SOB (shortness of breath)    Thyroid  disease  hypothyroidism   Vitamin D  deficiency     SURGICAL HISTORY: Past Surgical History:  Procedure Laterality Date   BREAST BIOPSY Left 06/09/2022   US  LT BREAST BX W LOC DEV 1ST LESION IMG BX SPEC US  GUIDE 06/09/2022 [facility] MAMMOGRAPHY   CESAREAN SECTION  1999   Dilatation and currettagement     for miscarriage 18 years ago   PORTACATH PLACEMENT Right 07/05/2022   Procedure: INSERTION PORT-A-CATH;  Surgeon: Sim Dryer, MD;  Location: [facility] OR;  Service: General;  Laterality: Right;   SIMPLE MASTECTOMY WITH AXILLARY SENTINEL NODE BIOPSY Bilateral 07/26/2023    Procedure: LEFT SKIN REDUCING MASTECTOMY, RIGHT SKIN SPARING, RISK REDUCING MASTECTOMY;  Surgeon: Sim Dryer, MD;  Location: [facility] OR;  Service: General;  Laterality: Bilateral;  PEC BLOCK    SOCIAL HISTORY: Social History   Socioeconomic History   Marital status: Married    Spouse name: Athena Bland   Number of children: Not on file   Years of education: Not on file   Highest education level: Not on file  Occupational History   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  Tobacco Use   Smoking status: Never   Smokeless tobacco: Never  Vaping Use   Vaping status: Never Used  Substance and Sexual Activity   Alcohol use: No   Drug use: No   Sexual activity: Yes    Birth control/protection: Pill  Other Topics Concern   Not on file  Social History Narrative   Not on file   Social Drivers of Health   Financial Resource Strain: Not on file  Food Insecurity: No Food Insecurity (10/09/2023)   Hunger Vital Sign    Worried About Running Out of Food in the Last Year: Never true    Ran Out of Food in the Last Year: Never true  Transportation Needs: No Transportation Needs (10/09/2023)   PRAPARE - Administrator, Civil Service (Medical): No    Lack of Transportation (Non-Medical): No  Physical Activity: Not on file  Stress: Not on file  Social Connections: Moderately Integrated (07/26/2023)   Social Connection and Isolation Panel [NHANES]    Frequency of Communication with Friends and Family: Three times a week    Frequency of Social Gatherings with Friends and Family: Three times a week    Attends Religious Services: More than 4 times per year    Active Member of Clubs or Organizations: No    Attends Banker Meetings: Never    Marital Status: Married  Catering manager Violence: Not At Risk (10/09/2023)   Humiliation, Afraid, Rape, and Kick questionnaire    Fear of Current or Ex-Partner: No    Emotionally Abused: No    Physically Abused: No    Sexually Abused: No     FAMILY HISTORY: Family History  Problem Relation Age of Onset   Hypertension Mother    Diabetes Mother    High Cholesterol Mother    Thyroid  disease Mother    Depression Mother    Anxiety disorder Mother    Obesity Mother    CVA Father        hemorrhagic   Ovarian cancer Maternal Aunt 3   Colon cancer Maternal Aunt    Breast cancer Cousin 58       maternal first cousin, reports negative genetic testing    Review of Systems  Constitutional:  Negative for appetite change, chills, fatigue, fever and unexpected weight change.  HENT:   Negative for hearing loss, lump/mass and trouble swallowing.  Eyes:  Negative for eye problems and icterus.  Respiratory:  Negative for chest tightness, cough and shortness of breath.   Cardiovascular:  Negative for chest pain, leg swelling and palpitations.  Gastrointestinal:  Negative for abdominal distention, abdominal pain, constipation, diarrhea, nausea and vomiting.  Endocrine: Negative for hot flashes.  Genitourinary:  Negative for difficulty urinating.   Musculoskeletal:  Negative for arthralgias.  Skin:  Negative for itching and rash.  Neurological:  Positive for headaches. Negative for dizziness, extremity weakness and numbness.  Hematological:  Negative for adenopathy. Does not bruise/bleed easily.  Psychiatric/Behavioral:  Negative for depression. The patient is not nervous/anxious.       PHYSICAL EXAMINATION    Vitals:   10/23/23 1232  BP: 120/64  Pulse: 83  Resp: 18  Temp: 98.2 F (36.8 C)  SpO2: 100%    Physical Exam Constitutional:      General: She is not in acute distress.    Appearance: Normal appearance. She is not toxic-appearing.  HENT:     Head: Normocephalic and atraumatic.     Mouth/Throat:     Mouth: Mucous membranes are moist.     Pharynx: Oropharynx is clear. No oropharyngeal exudate or posterior oropharyngeal erythema.  Eyes:     General: No scleral icterus. Cardiovascular:     Rate and  Rhythm: Normal rate and regular rhythm.     Pulses: Normal pulses.     Heart sounds: Normal heart sounds.  Pulmonary:     Effort: Pulmonary effort is normal.     Breath sounds: Normal breath sounds.  Chest:     Comments: S/p bilateral mastectomy. Small opening right breast with no concern for infection. Port site appears well. Abdominal:     General: Abdomen is flat. Bowel sounds are normal. There is no distension.     Palpations: Abdomen is soft.     Tenderness: There is no abdominal tenderness.  Musculoskeletal:        General: No swelling.     Cervical back: Neck supple.  Lymphadenopathy:     Cervical: No cervical adenopathy.  Skin:    General: Skin is warm and dry.     Findings: No rash.  Neurological:     General: No focal deficit present.     Mental Status: She is alert.  Psychiatric:        Mood and Affect: Mood normal.        Behavior: Behavior normal.     LABORATORY DATA:  CBC    Component Value Date/Time   WBC 6.2 10/23/2023 1215   WBC 4.0 10/08/2023 0517   RBC 3.64 (L) 10/23/2023 1215   HGB 8.8 (L) 10/23/2023 1215   HGB 12.0 07/21/2021 1511   HCT 28.8 (L) 10/23/2023 1215   HCT 37.6 07/21/2021 1511   PLT 279 10/23/2023 1215   PLT 359 07/21/2021 1511   MCV 79.1 (L) 10/23/2023 1215   MCV 76 (L) 07/21/2021 1511   MCH 24.2 (L) 10/23/2023 1215   MCHC 30.6 10/23/2023 1215   RDW 17.6 (H) 10/23/2023 1215   RDW 15.7 (H) 07/21/2021 1511   LYMPHSABS 2.0 10/23/2023 1215   LYMPHSABS 1.9 07/21/2021 1511   MONOABS 0.5 10/23/2023 1215   EOSABS 0.2 10/23/2023 1215   EOSABS 0.2 07/21/2021 1511   BASOSABS 0.1 10/23/2023 1215   BASOSABS 0.1 07/21/2021 1511    CMP     Component Value Date/Time   NA 141 10/23/2023 1215   NA 139 03/28/2022 0811   K 3.8  10/23/2023 1215   CL 107 10/23/2023 1215   CO2 29 10/23/2023 1215   GLUCOSE 90 10/23/2023 1215   BUN 10 10/23/2023 1215   BUN 14 03/28/2022 0811   CREATININE 0.58 10/23/2023 1215   CREATININE 0.74 10/09/2012  1537   CALCIUM 9.1 10/23/2023 1215   PROT 7.8 10/23/2023 1215   PROT 7.1 03/28/2022 0811   ALBUMIN 3.6 10/23/2023 1215   ALBUMIN 4.2 03/28/2022 0811   AST 27 10/23/2023 1215   ALT 13 10/23/2023 1215   ALKPHOS 107 10/23/2023 1215   BILITOT 0.4 10/23/2023 1215   GFRNONAA >60 10/23/2023 1215   GFRNONAA >89 10/09/2012 1537   GFRAA 96 05/18/2020 1442   GFRAA >89 10/09/2012 1537     ASSESSMENT and THERAPY PLAN:   Malignant neoplasm of upper-inner quadrant of left breast in female, estrogen receptor positive (HCC) Assessment and Plan Assessment & Plan Immune response to Kadcyla  Recent immune response to Kadcyla  with fever and chills? . Differential diagnosis excluded sepsis and MRSA.  Discussed recurrence risk and need for hospital evaluation if fever recurs. - Advise hospital evaluation if fever recurs, unfortunately she is a high risk for infection. - Contact healthcare provider if fever occurs during the week for guidance.  Joint pain New onset joint pain likely due to Kadcyla , affecting multiple joints and mobility. Currently managed with Advil  and Tylenol . - Advise use of Advil  or Tylenol  for pain management, up to twice daily. - Consider dose modification of Kadcyla  if joint pain persists.   All questions were answered. The patient knows to call the clinic with any problems, questions or concerns. We can certainly see the patient much sooner if necessary.  Total encounter time:30 minutes*in face-to-face visit time, chart review, lab review, care coordination, order entry, and documentation of the encounter time.  *Total Encounter Time as defined by the Centers for Medicare and Medicaid Services includes, in addition to the face-to-face time of a patient visit (documented in the note above) non-face-to-face time: obtaining and reviewing outside history, ordering and reviewing medications, tests or procedures, care coordination (communications with other health care professionals  or caregivers) and documentation in the medical record.

## 2023-10-29 ENCOUNTER — Encounter: Payer: Self-pay | Admitting: Hematology and Oncology

## 2023-11-05 ENCOUNTER — Telehealth: Payer: Self-pay | Admitting: *Deleted

## 2023-11-05 NOTE — Telephone Encounter (Signed)
 ERROR

## 2023-11-05 NOTE — Telephone Encounter (Addendum)
 Pt called with c/o numbness and tingling to both left and right hands, along with pain, majority in the left hand and worse with pain, and warm to touch. Swelling is also noted by patient. She has taken all medications that she has and it is still not decreased the feeling of PN and pain. Pt has agreed to be scheduled for [facility] on 6/19 at 1020 am. Advised to arrive 15 min prior to appt. Pt verbalized understanding

## 2023-11-06 ENCOUNTER — Encounter: Admitting: Adult Health

## 2023-11-07 ENCOUNTER — Other Ambulatory Visit: Payer: Self-pay | Admitting: Hematology and Oncology

## 2023-11-07 DIAGNOSIS — E876 Hypokalemia: Secondary | ICD-10-CM

## 2023-11-08 ENCOUNTER — Inpatient Hospital Stay (HOSPITAL_BASED_OUTPATIENT_CLINIC_OR_DEPARTMENT_OTHER): Admitting: Adult Health

## 2023-11-08 ENCOUNTER — Encounter: Payer: Self-pay | Admitting: Adult Health

## 2023-11-08 VITALS — BP 139/55 | HR 88 | Temp 97.9°F | Resp 18 | Ht 61.0 in | Wt 276.0 lb

## 2023-11-08 DIAGNOSIS — C50212 Malignant neoplasm of upper-inner quadrant of left female breast: Secondary | ICD-10-CM

## 2023-11-08 DIAGNOSIS — Z5112 Encounter for antineoplastic immunotherapy: Secondary | ICD-10-CM | POA: Diagnosis not present

## 2023-11-08 DIAGNOSIS — Z170 Estrogen receptor positive status [ER+]: Secondary | ICD-10-CM

## 2023-11-08 NOTE — Progress Notes (Signed)
 [facility] Cancer Follow up:    Diamond Lowers, MD [address] [place] [address]   DIAGNOSIS: Cancer Staging  Malignant neoplasm of upper-inner quadrant of left breast in female, estrogen receptor positive (HCC) Staging form: Breast, AJCC 8th Edition - Clinical stage from 06/21/2022: Stage IV (cT3, cN3, cM1, G3, ER+, PR+, HER2+) - Signed by [name], MD on 09/17/2023 Stage prefix: Initial diagnosis Histologic grading system: 3 grade system Stage used in treatment planning: Yes National guidelines used in treatment planning: Yes Type of national guideline used in treatment planning: NCCN    SUMMARY OF ONCOLOGIC HISTORY: Oncology History  Malignant neoplasm of upper-inner quadrant of left breast in female, estrogen receptor positive (HCC)  06/06/2022 Mammogram   Diagnostic mammogram given palpable mass showed suspicious spiculated left breast mass with surrounding nodularity at 9 o'clock position.  This measures up to 3.1 cm sonographically however due to deep location and patient body habitus the mammographic measurement of 5.8 cm is felt to be more accurate.  At least 2 abnormal lymph nodes in the left axilla.  Possible abnormal palpable left supraclavicular lymph node.   06/06/2022 Breast US    Breast ultrasound confirmed these findings.   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  Prognostic showed ER 65% weak to strong staining, PR 15% weak to moderate staining, Ki-67 of 50% and HER2 positive by IHC at 3+    Genetic Testing   [facility] Custom Panel+RNA was Negative. Report date is 06/28/2022.   The Custom Hereditary Cancers Panel offered by [facility] includes sequencing and/or deletion duplication testing of the following 43 genes: APC, ATM, AXIN2, BAP1, BARD1, BMPR1A, BRCA1, BRCA2, BRIP1, CDH1,  CDK4, CDKN2A (p14ARF and p16INK4a only), CHEK2, CTNNA1, EPCAM (Deletion/duplication testing only), FH, GREM1 (promoter region duplication testing only), HOXB13, KIT, MBD4, MEN1, MLH1, MSH2, MSH3, MSH6, MUTYH, NF1, NHTL1, PALB2, PDGFRA, PMS2, POLD1, POLE, PTEN, RAD51C, RAD51D, SMAD4, SMARCA4. STK11, TP53, TSC1, TSC2, and VHL.    06/21/2022 Cancer Staging   Staging form: Breast, AJCC 8th Edition - Clinical stage from 06/21/2022: Stage IV (cT3, cN3, cM1, G3, ER+, PR+, HER2+) - Signed by [name], MD on 09/17/2023 Stage prefix: Initial diagnosis Histologic grading system: 3 grade system Stage used in treatment planning: Yes National guidelines used in treatment planning: Yes Type of national guideline used in treatment planning: NCCN   06/29/2022 PET scan   IMPRESSION: 1. Hypermetabolic left breast mass, compatible with primary breast malignancy. 2. Hypermetabolic left cervical, axillary, subpectoral, supraclavicular and prevascular mediastinal lymph nodes, compatible with nodal metastatic disease. 3. No evidence of metastatic disease in the abdomen or pelvis. 4. Aortic Atherosclerosis (ICD10-I70.0).     Electronically Signed   By: [name] M.D.   On: 06/29/2022 09:32   07/11/2022 - 01/16/2023 Chemotherapy   Patient is on Treatment Plan : BREAST DOCEtaxel  + Trastuzumab  + Pertuzumab  (THP) q21d x 8 cycles / Trastuzumab  + Pertuzumab  q21d x 4 cycles     02/01/2023 - 06/29/2023 Chemotherapy   Patient is on Treatment Plan : BREAST METASTATIC Fam-Trastuzumab Deruxtecan-nxki  (Enhertu ) (5.4) q21d     10/02/2023 -  Chemotherapy   Patient is on Treatment Plan : BREAST ADO-Trastuzumab Emtansine  (Kadcyla ) q21d       CURRENT THERAPY:  INTERVAL HISTORY: Discussed the use of AI scribe software for clinical note transcription with the patient,  who gave verbal consent to proceed.  History of Present Illness Diamond Marshall is a 53 year old female with breast cancer who presents for follow-up  and evaluation.  She is undergoing treatment with Kadcyla  for breast cancer. On May 16th, she was admitted to the ER with fever, chills, and nausea, and was diagnosed with mid sepsis, receiving IV antibiotics during her stay.  She experiences neuropathy, primarily in her hands, with tingling persisting despite changes in keyboard height. The tingling affects the nerve supplying two fingers and sometimes radiates up her arm. She uses a TENS unit and compression gloves for relief, and Aleve is more effective than Tylenol  or ibuprofen .  She has pernicious anemia and takes B12 injections.     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   Breast cancer, stage 4 (HCC) 07/26/2023   Gastroenteritis 09/29/2022   Port-A-Cath in place 07/07/2022   Genetic testing 06/29/2022   Malignant neoplasm of upper-inner quadrant of left breast in female, estrogen receptor positive (HCC) 06/19/2022   Chronic pain of right knee 03/27/2022   Essential hypertension 03/27/2022   Positive self-administered antigen test for COVID-19 03/15/2021   Prediabetes 07/22/2019   Anxiety and depression 03/15/2016   Vitamin D  deficiency 03/15/2016   Metabolic syndrome X 08/07/2013   Elevated random blood glucose level 08/07/2013   Hypertriglyceridemia 08/07/2013   ADD (attention deficit disorder) 07/24/2013    hyperlipidemia 10/09/2012   Morbid obesity (HCC) 10/09/2012   Hypothyroidism 10/09/2012   B12 deficiency 10/09/2012   Anemia, unspecified 02/12/2011   Dysthymic disorder 02/12/2011   Migraine headache 02/12/2011   Obsessive-compulsive disorder 02/12/2011   Tinnitus 02/12/2011    is allergic to phentermine , bee venom, lactose intolerance (gi), peanut allergen powder-dnfp, topiramate , sulfa antibiotics, tape, and wound dressing adhesive.  MEDICAL  HISTORY: Past Medical History:  Diagnosis Date   Abdominal hernia    Anemia    b12 deficiency   Anxiety    Arthritis    B12 deficiency    Back pain    Cancer (HCC)    breast cancer   Depression    Family history of adverse reaction to anesthesia    mother vomits after anesthesia   Gallbladder problem    GERD (gastroesophageal reflux disease)    History of hiatal hernia    small per patient   Hypertension    Hypothyroidism    Joint pain    Knee pain    Palpitation    when iron is low   Pernicious anemia    SOB (shortness of breath)    Thyroid  disease    hypothyroidism   Vitamin D  deficiency     SURGICAL HISTORY: Past Surgical History:  Procedure Laterality Date   BREAST BIOPSY Left 06/09/2022   US  LT BREAST BX W LOC DEV 1ST LESION IMG BX SPEC US  GUIDE 06/09/2022 [facility] MAMMOGRAPHY   CESAREAN SECTION  1999   Dilatation and currettagement     for miscarriage 18 years ago   PORTACATH PLACEMENT Right 07/05/2022   Procedure: INSERTION PORT-A-CATH;  Surgeon: [name], MD;  Location: [facility] OR;  Service: General;  Laterality: Right;   SIMPLE MASTECTOMY WITH AXILLARY SENTINEL NODE BIOPSY Bilateral 07/26/2023   Procedure: LEFT SKIN REDUCING MASTECTOMY, RIGHT SKIN SPARING, RISK REDUCING MASTECTOMY;  Surgeon: [name], MD;  Location: [facility] OR;  Service: General;  Laterality:  Bilateral;  PEC BLOCK    SOCIAL HISTORY: Social History   Socioeconomic History   Marital status: Married    Spouse name: Garrel   Number of children: Not on file   Years of education: Not on file   Highest education level: Not on file  Occupational History   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  Tobacco Use   Smoking status: Never   Smokeless tobacco: Never  Vaping Use   Vaping status: Never Used  Substance and Sexual Activity   Alcohol use: No   Drug use: No   Sexual activity: Yes    Birth control/protection: Pill  Other Topics Concern   Not on file  Social History Narrative   Not  on file   Social Drivers of Health   Financial Resource Strain: Not on file  Food Insecurity: No Food Insecurity (10/09/2023)   Hunger Vital Sign    Worried About Running Out of Food in the Last Year: Never true    Ran Out of Food in the Last Year: Never true  Transportation Needs: No Transportation Needs (10/09/2023)   PRAPARE - Administrator, Civil Service (Medical): No    Lack of Transportation (Non-Medical): No  Physical Activity: Not on file  Stress: Not on file  Social Connections: Moderately Integrated (07/26/2023)   Social Connection and Isolation Panel    Frequency of Communication with Friends and Family: Three times a week    Frequency of Social Gatherings with Friends and Family: Three times a week    Attends Religious Services: More than 4 times per year    Active Member of Clubs or Organizations: No    Attends Banker Meetings: Never    Marital Status: Married  Catering manager Violence: Not At Risk (10/09/2023)   Humiliation, Afraid, Rape, and Kick questionnaire    Fear of Current or Ex-Partner: No    Emotionally Abused: No    Physically Abused: No    Sexually Abused: No    FAMILY HISTORY: Family History  Problem Relation Age of Onset   Hypertension Mother    Diabetes Mother    High Cholesterol Mother    Thyroid  disease Mother    Depression Mother    Anxiety disorder Mother    Obesity Mother    CVA Father        hemorrhagic   Ovarian cancer Maternal Aunt 74   Colon cancer Maternal Aunt    Breast cancer Cousin 22       maternal first cousin, reports negative genetic testing    Review of Systems  Constitutional:  Negative for appetite change, chills, fatigue, fever and unexpected weight change.  HENT:   Negative for hearing loss, lump/mass and trouble swallowing.   Eyes:  Negative for eye problems and icterus.  Respiratory:  Negative for chest tightness, cough and shortness of breath.   Cardiovascular:  Negative for chest pain,  leg swelling and palpitations.  Gastrointestinal:  Negative for abdominal distention, abdominal pain, constipation, diarrhea, nausea and vomiting.  Endocrine: Negative for hot flashes.  Genitourinary:  Negative for difficulty urinating.   Musculoskeletal:  Negative for arthralgias.  Skin:  Negative for itching and rash.  Neurological:  Negative for dizziness, extremity weakness, headaches and numbness.  Hematological:  Negative for adenopathy. Does not bruise/bleed easily.  Psychiatric/Behavioral:  Negative for depression. The patient is not nervous/anxious.       PHYSICAL EXAMINATION    Vitals:   11/08/23 1038  BP: (!) 139/55  Pulse:  88  Resp: 18  Temp: 97.9 F (36.6 C)  SpO2: 97%    Physical Exam Constitutional:      General: She is not in acute distress.    Appearance: Normal appearance. She is not toxic-appearing.  HENT:     Head: Normocephalic and atraumatic.     Mouth/Throat:     Mouth: Mucous membranes are moist.     Pharynx: Oropharynx is clear. No oropharyngeal exudate or posterior oropharyngeal erythema.   Eyes:     General: No scleral icterus.   Cardiovascular:     Rate and Rhythm: Normal rate and regular rhythm.     Pulses: Normal pulses.     Heart sounds: Normal heart sounds.  Pulmonary:     Effort: Pulmonary effort is normal.     Breath sounds: Normal breath sounds.  Abdominal:     General: Abdomen is flat. Bowel sounds are normal. There is no distension.     Palpations: Abdomen is soft.     Tenderness: There is no abdominal tenderness.   Musculoskeletal:        General: No swelling.     Cervical back: Neck supple.  Lymphadenopathy:     Cervical: No cervical adenopathy.   Skin:    General: Skin is warm and dry.     Findings: No rash.   Neurological:     General: No focal deficit present.     Mental Status: She is alert.   Psychiatric:        Mood and Affect: Mood normal.        Behavior: Behavior normal.     LABORATORY  DATA:  CBC    Component Value Date/Time   WBC 6.2 10/23/2023 1215   WBC 4.0 10/08/2023 0517   RBC 3.64 (L) 10/23/2023 1215   HGB 8.8 (L) 10/23/2023 1215   HGB 12.0 07/21/2021 1511   HCT 28.8 (L) 10/23/2023 1215   HCT 37.6 07/21/2021 1511   PLT 279 10/23/2023 1215   PLT 359 07/21/2021 1511   MCV 79.1 (L) 10/23/2023 1215   MCV 76 (L) 07/21/2021 1511   MCH 24.2 (L) 10/23/2023 1215   MCHC 30.6 10/23/2023 1215   RDW 17.6 (H) 10/23/2023 1215   RDW 15.7 (H) 07/21/2021 1511   LYMPHSABS 2.0 10/23/2023 1215   LYMPHSABS 1.9 07/21/2021 1511   MONOABS 0.5 10/23/2023 1215   EOSABS 0.2 10/23/2023 1215   EOSABS 0.2 07/21/2021 1511   BASOSABS 0.1 10/23/2023 1215   BASOSABS 0.1 07/21/2021 1511    CMP     Component Value Date/Time   NA 141 10/23/2023 1215   NA 139 03/28/2022 0811   K 3.8 10/23/2023 1215   CL 107 10/23/2023 1215   CO2 29 10/23/2023 1215   GLUCOSE 90 10/23/2023 1215   BUN 10 10/23/2023 1215   BUN 14 03/28/2022 0811   CREATININE 0.58 10/23/2023 1215   CREATININE 0.74 10/09/2012 1537   CALCIUM 9.1 10/23/2023 1215   PROT 7.8 10/23/2023 1215   PROT 7.1 03/28/2022 0811   ALBUMIN 3.6 10/23/2023 1215   ALBUMIN 4.2 03/28/2022 0811   AST 27 10/23/2023 1215   ALT 13 10/23/2023 1215   ALKPHOS 107 10/23/2023 1215   BILITOT 0.4 10/23/2023 1215   GFRNONAA >60 10/23/2023 1215   GFRNONAA >89 10/09/2012 1537   GFRAA 96 05/18/2020 1442   GFRAA >89 10/09/2012 1537     ASSESSMENT and THERAPY PLAN:   Assessment and Plan Assessment & Plan Breast cancer, current treatment Undergoing Kadcyla   treatment. Hospitalized for mild sepsis, treated with IV antibiotics. Reports less drainage from previous MRSA site after scab removal. - Continue Kadcyla  treatment as scheduled. - Reassess condition during next appointment on Monday.  Peripheral neuropathy Tingling and pain in fingers, possibly from keyboard use. Pain resolved with keyboard change, tingling persists. Aleve provides  relief, indicating inflammation. Uses TENS unit and compression gloves. - Advise taking Aleve twice daily with food over the weekend to manage inflammation. - Reassess neuropathy symptoms during next appointment on Monday. -  Consider use cold mittens during chemotherapy sessions.  Pernicious anemia Prescribed B12 injections. Recent B12 shot may affect lab results. Family history of pernicious anemia. History of anemia despite iron supplementation. - Order vitamin D  level test on July 15th. - Order B12 level test on July 15th, ensuring no B12 shot is taken before the test.  RTC as scheduled for next [facility].      All questions were answered. The patient knows to call the clinic with any problems, questions or concerns. We can certainly see the patient much sooner if necessary.  Total encounter time:30 minutes*in face-to-face visit time, chart review, lab review, care coordination, order entry, and documentation of the encounter time.    Morna Kendall, NP 11/08/23 10:50 AM Medical Oncology and Hematology [facility] [address] [place], [place] [address] Tel. [phone]    Fax. [phone]  *Total Encounter Time as defined by the Centers for Medicare and Medicaid Services includes, in addition to the face-to-face time of a patient visit (documented in the note above) non-face-to-face time: obtaining and reviewing outside history, ordering and reviewing medications, tests or procedures, care coordination (communications with other health care professionals or caregivers) and documentation in the medical record.

## 2023-11-09 ENCOUNTER — Telehealth: Payer: Self-pay

## 2023-11-09 ENCOUNTER — Ambulatory Visit (HOSPITAL_COMMUNITY)
Admission: RE | Admit: 2023-11-09 | Discharge: 2023-11-09 | Disposition: A | Discharge status: Home / Self Care | Source: Ambulatory Visit | Attending: Family Medicine | Admitting: Family Medicine

## 2023-11-09 DIAGNOSIS — Z0189 Encounter for other specified special examinations: Secondary | ICD-10-CM

## 2023-11-09 DIAGNOSIS — Z170 Estrogen receptor positive status [ER+]: Secondary | ICD-10-CM

## 2023-11-09 DIAGNOSIS — C50212 Malignant neoplasm of upper-inner quadrant of left female breast: Secondary | ICD-10-CM

## 2023-11-09 DIAGNOSIS — Z09 Encounter for follow-up examination after completed treatment for conditions other than malignant neoplasm: Secondary | ICD-10-CM | POA: Diagnosis present

## 2023-11-09 LAB — ECHOCARDIOGRAM COMPLETE
Area-P 1/2: 5.42 cm2
S' Lateral: 3.4 cm

## 2023-11-09 NOTE — Telephone Encounter (Signed)
 Verbally confirmed appt for 6/23

## 2023-11-12 ENCOUNTER — Inpatient Hospital Stay (HOSPITAL_BASED_OUTPATIENT_CLINIC_OR_DEPARTMENT_OTHER): Admitting: Hematology and Oncology

## 2023-11-12 ENCOUNTER — Encounter: Payer: Self-pay | Admitting: Hematology and Oncology

## 2023-11-12 ENCOUNTER — Inpatient Hospital Stay

## 2023-11-12 VITALS — BP 148/76 | HR 86 | Temp 98.2°F | Resp 19 | Wt 282.5 lb

## 2023-11-12 VITALS — BP 140/74 | HR 82 | Temp 98.3°F | Resp 18

## 2023-11-12 DIAGNOSIS — C50212 Malignant neoplasm of upper-inner quadrant of left female breast: Secondary | ICD-10-CM

## 2023-11-12 DIAGNOSIS — Z170 Estrogen receptor positive status [ER+]: Secondary | ICD-10-CM

## 2023-11-12 DIAGNOSIS — Z95828 Presence of other vascular implants and grafts: Secondary | ICD-10-CM

## 2023-11-12 DIAGNOSIS — Z5112 Encounter for antineoplastic immunotherapy: Secondary | ICD-10-CM | POA: Diagnosis not present

## 2023-11-12 LAB — CBC WITH DIFFERENTIAL (CANCER CENTER ONLY)
Abs Immature Granulocytes: 0.02 10*3/uL (ref 0.00–0.07)
Basophils Absolute: 0 10*3/uL (ref 0.0–0.1)
Basophils Relative: 1 %
Eosinophils Absolute: 0.2 10*3/uL (ref 0.0–0.5)
Eosinophils Relative: 3 %
HCT: 26.6 % — ABNORMAL LOW (ref 36.0–46.0)
Hemoglobin: 8.3 g/dL — ABNORMAL LOW (ref 12.0–15.0)
Immature Granulocytes: 0 %
Lymphocytes Relative: 30 %
Lymphs Abs: 1.8 10*3/uL (ref 0.7–4.0)
MCH: 23.9 pg — ABNORMAL LOW (ref 26.0–34.0)
MCHC: 31.2 g/dL (ref 30.0–36.0)
MCV: 76.7 fL — ABNORMAL LOW (ref 80.0–100.0)
Monocytes Absolute: 0.5 10*3/uL (ref 0.1–1.0)
Monocytes Relative: 8 %
Neutro Abs: 3.5 10*3/uL (ref 1.7–7.7)
Neutrophils Relative %: 58 %
Platelet Count: 205 10*3/uL (ref 150–400)
RBC: 3.47 MIL/uL — ABNORMAL LOW (ref 3.87–5.11)
RDW: 18.5 % — ABNORMAL HIGH (ref 11.5–15.5)
WBC Count: 6 10*3/uL (ref 4.0–10.5)
nRBC: 0 % (ref 0.0–0.2)

## 2023-11-12 LAB — CMP (CANCER CENTER ONLY)
ALT: 14 U/L (ref 0–44)
AST: 31 U/L (ref 15–41)
Albumin: 3.5 g/dL (ref 3.5–5.0)
Alkaline Phosphatase: 109 U/L (ref 38–126)
Anion gap: 6 (ref 5–15)
BUN: 12 mg/dL (ref 6–20)
CO2: 28 mmol/L (ref 22–32)
Calcium: 9 mg/dL (ref 8.9–10.3)
Chloride: 108 mmol/L (ref 98–111)
Creatinine: 0.54 mg/dL (ref 0.44–1.00)
GFR, Estimated: >60 mL/min (ref >60)
Glucose, Bld: 102 mg/dL — ABNORMAL HIGH (ref 70–99)
Potassium: 3.8 mmol/L (ref 3.5–5.1)
Sodium: 142 mmol/L (ref 135–145)
Total Bilirubin: 0.5 mg/dL (ref 0.0–1.2)
Total Protein: 7.4 g/dL (ref 6.5–8.1)

## 2023-11-12 LAB — VITAMIN D 25 HYDROXY (VIT D DEFICIENCY, FRACTURES): Vit D, 25-Hydroxy: 13.87 ng/mL — ABNORMAL LOW (ref 30–100)

## 2023-11-12 MED ORDER — SODIUM CHLORIDE 0.9% FLUSH
10.0000 mL | INTRAVENOUS | Status: DC | PRN
  Administered 2023-11-12: 10 mL

## 2023-11-12 MED ORDER — PROCHLORPERAZINE MALEATE 10 MG PO TABS
10.0000 mg | ORAL_TABLET | Freq: Once | ORAL | Status: AC
  Administered 2023-11-12: 10 mg via ORAL
  Filled 2023-11-12: qty 1

## 2023-11-12 MED ORDER — SODIUM CHLORIDE 0.9 % IV SOLN: INTRAVENOUS | Status: DC

## 2023-11-12 MED ORDER — DIPHENHYDRAMINE HCL 25 MG PO CAPS
50.0000 mg | ORAL_CAPSULE | Freq: Once | ORAL | Status: AC
  Administered 2023-11-12: 50 mg via ORAL
  Filled 2023-11-12: qty 2

## 2023-11-12 MED ORDER — HEPARIN SOD (PORK) LOCK FLUSH 100 UNIT/ML IV SOLN
500.0000 [IU] | Freq: Once | INTRAVENOUS | Status: AC | PRN
  Administered 2023-11-12: 500 [IU]

## 2023-11-12 MED ORDER — ACETAMINOPHEN 325 MG PO TABS
650.0000 mg | ORAL_TABLET | Freq: Once | ORAL | Status: AC
  Administered 2023-11-12: 650 mg via ORAL
  Filled 2023-11-12: qty 2

## 2023-11-12 MED ORDER — SODIUM CHLORIDE 0.9% FLUSH
10.0000 mL | Freq: Once | INTRAVENOUS | Status: AC
  Administered 2023-11-12: 10 mL

## 2023-11-12 MED ORDER — SODIUM CHLORIDE 0.9 % IV SOLN
3.6000 mg/kg | Freq: Once | INTRAVENOUS | Status: AC
  Administered 2023-11-12: 480 mg via INTRAVENOUS
  Filled 2023-11-12: qty 24

## 2023-11-12 NOTE — Assessment & Plan Note (Signed)
  Assessment & Plan Assessment and Plan  Breast cancer on kadcyla . - Continue Kadcyla  treatment. - Schedule scans every six months, next in Sep/October. - Monitor for Kadcyla  side effects, including fever and nausea.  Peripheral neuropathy Peripheral neuropathy with tingling and numbness improved after keyboard change. Compression gloves and ice mittens effective. - Continue using compression gloves and ice mittens for symptom relief.  Anemia Anemia stable with regular blood count monitoring.  Goals of Care Advanced care planning discussed. She is aware of illness and treatment intent. Agreed to aggressive care if health declines. - Obtain a healthcare power of attorney document.

## 2023-11-12 NOTE — Progress Notes (Signed)
 [facility] Cancer Follow up:    Zollie Lowers, MD [address] [place] [address]   DIAGNOSIS:  Cancer Staging  Malignant neoplasm of upper-inner quadrant of left breast in female, estrogen receptor positive (HCC) Staging form: Breast, AJCC 8th Edition - Clinical stage from 06/21/2022: Stage IV (cT3, cN3, cM1, G3, ER+, PR+, HER2+) - Signed by [name], MD on 09/17/2023 Stage prefix: Initial diagnosis Histologic grading system: 3 grade system Stage used in treatment planning: Yes National guidelines used in treatment planning: Yes Type of national guideline used in treatment planning: NCCN    SUMMARY OF ONCOLOGIC HISTORY: Oncology History  Malignant neoplasm of upper-inner quadrant of left breast in female, estrogen receptor positive (HCC)  06/06/2022 Mammogram   Diagnostic mammogram given palpable mass showed suspicious spiculated left breast mass with surrounding nodularity at 9 o'clock position.  This measures up to 3.1 cm sonographically however due to deep location and patient body habitus the mammographic measurement of 5.8 cm is felt to be more accurate.  At least 2 abnormal lymph nodes in the left axilla.  Possible abnormal palpable left supraclavicular lymph node.   06/06/2022 Breast US    Breast ultrasound confirmed these findings.   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  Prognostic showed ER 65% weak to strong staining, PR 15% weak to moderate staining, Ki-67 of 50% and HER2 positive by IHC at 3+    Genetic Testing   [facility] Custom Panel+RNA was Negative. Report date is 06/28/2022.   The Custom Hereditary Cancers Panel offered by [facility] includes sequencing and/or deletion duplication testing of the following 43 genes: APC, ATM, AXIN2, BAP1, BARD1, BMPR1A, BRCA1, BRCA2, BRIP1,  CDH1, CDK4, CDKN2A (p14ARF and p16INK4a only), CHEK2, CTNNA1, EPCAM (Deletion/duplication testing only), FH, GREM1 (promoter region duplication testing only), HOXB13, KIT, MBD4, MEN1, MLH1, MSH2, MSH3, MSH6, MUTYH, NF1, NHTL1, PALB2, PDGFRA, PMS2, POLD1, POLE, PTEN, RAD51C, RAD51D, SMAD4, SMARCA4. STK11, TP53, TSC1, TSC2, and VHL.    06/21/2022 Cancer Staging   Staging form: Breast, AJCC 8th Edition - Clinical stage from 06/21/2022: Stage IV (cT3, cN3, cM1, G3, ER+, PR+, HER2+) - Signed by [name], MD on 09/17/2023 Stage prefix: Initial diagnosis Histologic grading system: 3 grade system Stage used in treatment planning: Yes National guidelines used in treatment planning: Yes Type of national guideline used in treatment planning: NCCN   06/29/2022 PET scan   IMPRESSION: 1. Hypermetabolic left breast mass, compatible with primary breast malignancy. 2. Hypermetabolic left cervical, axillary, subpectoral, supraclavicular and prevascular mediastinal lymph nodes, compatible with nodal metastatic disease. 3. No evidence of metastatic disease in the abdomen or pelvis. 4. Aortic Atherosclerosis (ICD10-I70.0).     Electronically Signed   By: [name] M.D.   On: 06/29/2022 09:32   07/11/2022 - 01/16/2023 Chemotherapy   Patient is on Treatment Plan : BREAST DOCEtaxel  + Trastuzumab  + Pertuzumab  (THP) q21d x 8 cycles / Trastuzumab  + Pertuzumab  q21d x 4 cycles     02/01/2023 - 06/29/2023 Chemotherapy   Patient is on Treatment Plan : BREAST METASTATIC Fam-Trastuzumab Deruxtecan-nxki  (Enhertu ) (5.4) q21d     10/02/2023 -  Chemotherapy   Patient is on Treatment Plan : BREAST ADO-Trastuzumab Emtansine  (Kadcyla ) q21d       CURRENT THERAPY: Kadcyla   INTERVAL HISTORY:  Discussed the use of AI scribe software for clinical note transcription  with the patient, who gave verbal consent to proceed.  History of Present Illness Diamond Marshall is a 53 year old female with metastatic breast cancer  who presents for follow-up on Kadcyla  treatment. She is undergoing treatment with Kadcyla  for metastatic breast cancer. The most recent treatment was better tolerated than the first, with only mild leg pain and a low-grade fever of about 100F, which did not require hospitalization.  She experienced tingling and numbness in one and a half fingers on the left hand, which she associates with a new keyboard at work. After changing the keyboard, these symptoms improved. She uses compression gloves and ice mittens to manage these symptoms.  Last week, she discovered gauze encapsulated in a 'goo thing' on the side of her breast that had MRSA. After cleaning it, the area seems to be healing well.  She reports mild nausea associated with Kadcyla , which affects her appetite, causing her to stop eating when she feels full. Despite this, she notes an increase in energy and muscle strength, as evidenced by her ability to walk through the grocery store and push a buggy, although this causes some chest discomfort.  No mouth sores, but she mentions a little dry mouth, which she manages with a rinse. No trouble with urination.      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   Breast cancer, stage 4 (HCC) 07/26/2023   Gastroenteritis 09/29/2022   Port-A-Cath in place 07/07/2022   Genetic testing 06/29/2022   Malignant neoplasm of upper-inner quadrant of left breast in female, estrogen receptor positive (HCC) 06/19/2022   Chronic pain of right knee 03/27/2022   Essential hypertension 03/27/2022   Positive self-administered antigen test for COVID-19 03/15/2021   Prediabetes 07/22/2019   Anxiety and depression 03/15/2016   Vitamin D  deficiency 03/15/2016   Metabolic syndrome X 08/07/2013   Elevated random blood glucose level 08/07/2013    Hypertriglyceridemia 08/07/2013   ADD (attention deficit disorder) 07/24/2013    hyperlipidemia 10/09/2012   Morbid obesity (HCC) 10/09/2012   Hypothyroidism 10/09/2012   B12 deficiency 10/09/2012   Anemia, unspecified 02/12/2011   Dysthymic disorder 02/12/2011   Migraine headache 02/12/2011   Obsessive-compulsive disorder 02/12/2011   Tinnitus 02/12/2011    is allergic to phentermine , bee venom, lactose intolerance (gi), peanut allergen powder-dnfp, topiramate , sulfa antibiotics, tape, and wound dressing adhesive.  MEDICAL HISTORY: Past Medical History:  Diagnosis Date   Abdominal hernia    Anemia    b12 deficiency   Anxiety    Arthritis    B12 deficiency    Back pain    Cancer (HCC)    breast cancer   Depression    Family history of adverse reaction to anesthesia    mother vomits after anesthesia   Gallbladder problem    GERD (gastroesophageal reflux disease)    History of hiatal hernia    small per patient   Hypertension    Hypothyroidism    Joint pain    Knee pain    Palpitation    when iron is low   Pernicious anemia    SOB (shortness of breath)    Thyroid  disease    hypothyroidism   Vitamin D  deficiency     SURGICAL HISTORY: Past Surgical History:  Procedure Laterality Date   BREAST BIOPSY Left 06/09/2022   US  LT BREAST BX W  LOC DEV 1ST LESION IMG BX SPEC US  GUIDE 06/09/2022 [facility] MAMMOGRAPHY   CESAREAN SECTION  1999   Dilatation and currettagement     for miscarriage 18 years ago   PORTACATH PLACEMENT Right 07/05/2022   Procedure: INSERTION PORT-A-CATH;  Surgeon: [name], MD;  Location: [facility] OR;  Service: General;  Laterality: Right;   SIMPLE MASTECTOMY WITH AXILLARY SENTINEL NODE BIOPSY Bilateral 07/26/2023   Procedure: LEFT SKIN REDUCING MASTECTOMY, RIGHT SKIN SPARING, RISK REDUCING MASTECTOMY;  Surgeon: [name], MD;  Location: [facility] OR;  Service: General;  Laterality: Bilateral;  PEC BLOCK    SOCIAL HISTORY: Social History    Socioeconomic History   Marital status: Married    Spouse name: Garrel   Number of children: Not on file   Years of education: Not on file   Highest education level: Not on file  Occupational History   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  Tobacco Use   Smoking status: Never   Smokeless tobacco: Never  Vaping Use   Vaping status: Never Used  Substance and Sexual Activity   Alcohol use: No   Drug use: No   Sexual activity: Yes    Birth control/protection: Pill  Other Topics Concern   Not on file  Social History Narrative   Not on file   Social Drivers of Health   Financial Resource Strain: Not on file  Food Insecurity: No Food Insecurity (10/09/2023)   Hunger Vital Sign    Worried About Running Out of Food in the Last Year: Never true    Ran Out of Food in the Last Year: Never true  Transportation Needs: No Transportation Needs (10/09/2023)   PRAPARE - Administrator, Civil Service (Medical): No    Lack of Transportation (Non-Medical): No  Physical Activity: Not on file  Stress: Not on file  Social Connections: Moderately Integrated (07/26/2023)   Social Connection and Isolation Panel    Frequency of Communication with Friends and Family: Three times a week    Frequency of Social Gatherings with Friends and Family: Three times a week    Attends Religious Services: More than 4 times per year    Active Member of Clubs or Organizations: No    Attends Banker Meetings: Never    Marital Status: Married  Catering manager Violence: Not At Risk (10/09/2023)   Humiliation, Afraid, Rape, and Kick questionnaire    Fear of Current or Ex-Partner: No    Emotionally Abused: No    Physically Abused: No    Sexually Abused: No    FAMILY HISTORY: Family History  Problem Relation Age of Onset   Hypertension Mother    Diabetes Mother    High Cholesterol Mother    Thyroid  disease Mother    Depression Mother    Anxiety disorder Mother    Obesity Mother     CVA Father        hemorrhagic   Ovarian cancer Maternal Aunt 49   Colon cancer Maternal Aunt    Breast cancer Cousin 22       maternal first cousin, reports negative genetic testing    Review of Systems  Constitutional:  Negative for appetite change, chills, fatigue, fever and unexpected weight change.  HENT:   Negative for hearing loss, lump/mass and trouble swallowing.   Eyes:  Negative for eye problems and icterus.  Respiratory:  Negative for chest tightness, cough and shortness of breath.   Cardiovascular:  Negative for chest pain, leg swelling and palpitations.  Gastrointestinal:  Negative for abdominal distention, abdominal pain, constipation, diarrhea, nausea and vomiting.  Endocrine: Negative for hot flashes.  Genitourinary:  Negative for difficulty urinating.   Musculoskeletal:  Negative for arthralgias.  Skin:  Negative for itching and rash.  Neurological:  Positive for headaches. Negative for dizziness, extremity weakness and numbness.  Hematological:  Negative for adenopathy. Does not bruise/bleed easily.  Psychiatric/Behavioral:  Negative for depression. The patient is not nervous/anxious.       PHYSICAL EXAMINATION    Vitals:   11/12/23 1213 11/12/23 1216  BP: (!) 173/72 (!) 148/76  Pulse: 86   Resp: 19   Temp: 98.2 F (36.8 C)   SpO2: 98%      Physical Exam Constitutional:      General: She is not in acute distress.    Appearance: Normal appearance. She is not toxic-appearing.  HENT:     Head: Normocephalic and atraumatic.     Mouth/Throat:     Mouth: Mucous membranes are moist.     Pharynx: Oropharynx is clear. No oropharyngeal exudate or posterior oropharyngeal erythema.   Eyes:     General: No scleral icterus.   Cardiovascular:     Rate and Rhythm: Normal rate and regular rhythm.     Pulses: Normal pulses.     Heart sounds: Normal heart sounds.  Pulmonary:     Effort: Pulmonary effort is normal.     Breath sounds: Normal breath sounds.   Chest:     Comments: S/p bilateral mastectomy. Small opening right breast with no concern for infection. Port site appears well. Abdominal:     General: Abdomen is flat. Bowel sounds are normal. There is no distension.     Palpations: Abdomen is soft.     Tenderness: There is no abdominal tenderness.   Musculoskeletal:        General: No swelling.     Cervical back: Neck supple.  Lymphadenopathy:     Cervical: No cervical adenopathy.   Skin:    General: Skin is warm and dry.     Findings: No rash.   Neurological:     General: No focal deficit present.     Mental Status: She is alert.   Psychiatric:        Mood and Affect: Mood normal.        Behavior: Behavior normal.     LABORATORY DATA:  CBC    Component Value Date/Time   WBC 6.0 11/12/2023 1159   WBC 4.0 10/08/2023 0517   RBC 3.47 (L) 11/12/2023 1159   HGB 8.3 (L) 11/12/2023 1159   HGB 12.0 07/21/2021 1511   HCT 26.6 (L) 11/12/2023 1159   HCT 37.6 07/21/2021 1511   PLT 205 11/12/2023 1159   PLT 359 07/21/2021 1511   MCV 76.7 (L) 11/12/2023 1159   MCV 76 (L) 07/21/2021 1511   MCH 23.9 (L) 11/12/2023 1159   MCHC 31.2 11/12/2023 1159   RDW 18.5 (H) 11/12/2023 1159   RDW 15.7 (H) 07/21/2021 1511   LYMPHSABS 1.8 11/12/2023 1159   LYMPHSABS 1.9 07/21/2021 1511   MONOABS 0.5 11/12/2023 1159   EOSABS 0.2 11/12/2023 1159   EOSABS 0.2 07/21/2021 1511   BASOSABS 0.0 11/12/2023 1159   BASOSABS 0.1 07/21/2021 1511    CMP     Component Value Date/Time   NA 142 11/12/2023 1159   NA 139 03/28/2022 0811   K 3.8 11/12/2023 1159   CL 108 11/12/2023 1159   CO2 28 11/12/2023 1159  GLUCOSE 102 (H) 11/12/2023 1159   BUN 12 11/12/2023 1159   BUN 14 03/28/2022 0811   CREATININE 0.54 11/12/2023 1159   CREATININE 0.74 10/09/2012 1537   CALCIUM 9.0 11/12/2023 1159   PROT 7.4 11/12/2023 1159   PROT 7.1 03/28/2022 0811   ALBUMIN 3.5 11/12/2023 1159   ALBUMIN 4.2 03/28/2022 0811   AST 31 11/12/2023 1159   ALT 14  11/12/2023 1159   ALKPHOS 109 11/12/2023 1159   BILITOT 0.5 11/12/2023 1159   GFRNONAA >60 11/12/2023 1159   GFRNONAA >89 10/09/2012 1537   GFRAA 96 05/18/2020 1442   GFRAA >89 10/09/2012 1537     ASSESSMENT and THERAPY PLAN:   Malignant neoplasm of upper-inner quadrant of left breast in female, estrogen receptor positive (HCC)  Assessment & Plan Assessment and Plan  Breast cancer on kadcyla . - Continue Kadcyla  treatment. - Schedule scans every six months, next in Sep/October. - Monitor for Kadcyla  side effects, including fever and nausea.  Peripheral neuropathy Peripheral neuropathy with tingling and numbness improved after keyboard change. Compression gloves and ice mittens effective. - Continue using compression gloves and ice mittens for symptom relief.  Anemia Anemia stable with regular blood count monitoring.  Goals of Care Advanced care planning discussed. She is aware of illness and treatment intent. Agreed to aggressive care if health declines. - Obtain a healthcare power of attorney document.     All questions were answered. The patient knows to call the clinic with any problems, questions or concerns. We can certainly see the patient much sooner if necessary.  Total encounter time:30 minutes*in face-to-face visit time, chart review, lab review, care coordination, order entry, and documentation of the encounter time.  *Total Encounter Time as defined by the Centers for Medicare and Medicaid Services includes, in addition to the face-to-face time of a patient visit (documented in the note above) non-face-to-face time: obtaining and reviewing outside history, ordering and reviewing medications, tests or procedures, care coordination (communications with other health care professionals or caregivers) and documentation in the medical record.

## 2023-11-12 NOTE — Patient Instructions (Signed)
 [facility] - A DEPT OF [facility]  Discharge Instructions: Thank you for choosing [facility] to provide your oncology and hematology care.   If you have a lab appointment with the Cancer Center, please go directly to the Cancer Center and check in at the registration area.   Wear comfortable clothing and clothing appropriate for easy access to any Portacath or PICC line.   We strive to give you quality time with your provider. You may need to reschedule your appointment if you arrive late (15 or more minutes).  Arriving late affects you and other patients whose appointments are after yours.  Also, if you miss three or more appointments without notifying the office, you may be dismissed from the clinic at the provider's discretion.      For prescription refill requests, have your pharmacy contact our office and allow 72 hours for refills to be completed.    Today you received the following chemotherapy and/or immunotherapy agents: Kadcyla      To help prevent nausea and vomiting after your treatment, we encourage you to take your nausea medication as directed.  BELOW ARE SYMPTOMS THAT SHOULD BE REPORTED IMMEDIATELY: *FEVER GREATER THAN 100.4 F (38 C) OR HIGHER *CHILLS OR SWEATING *NAUSEA AND VOMITING THAT IS NOT CONTROLLED WITH YOUR NAUSEA MEDICATION *UNUSUAL SHORTNESS OF BREATH *UNUSUAL BRUISING OR BLEEDING *URINARY PROBLEMS (pain or burning when urinating, or frequent urination) *BOWEL PROBLEMS (unusual diarrhea, constipation, pain near the anus) TENDERNESS IN MOUTH AND THROAT WITH OR WITHOUT PRESENCE OF ULCERS (sore throat, sores in mouth, or a toothache) UNUSUAL RASH, SWELLING OR PAIN  UNUSUAL VAGINAL DISCHARGE OR ITCHING   Items with * indicate a potential emergency and should be followed up as soon as possible or go to the Emergency Department if any problems should occur.  Please show the CHEMOTHERAPY ALERT CARD or IMMUNOTHERAPY  ALERT CARD at check-in to the Emergency Department and triage nurse.  Should you have questions after your visit or need to cancel or reschedule your appointment, please contact [facility] - A DEPT OF [facility]  Dept: [phone]  and follow the prompts.  Office hours are 8:00 a.m. to 4:30 p.m. Monday - Friday. Please note that voicemails left after 4:00 p.m. may not be returned until the following business day.  We are closed weekends and major holidays. You have access to a nurse at all times for urgent questions. Please call the main number to the clinic Dept: [phone] and follow the prompts.   For any non-urgent questions, you may also contact your provider using MyChart. We now offer e-Visits for anyone 29 and older to request care online for non-urgent symptoms. For details visit [email or web address].   Also download the MyChart app! Go to the app store, search "MyChart", open the app, select , and log in with your MyChart username and password.

## 2023-11-13 ENCOUNTER — Encounter: Payer: Self-pay | Admitting: Physical Therapy

## 2023-11-14 ENCOUNTER — Encounter: Payer: Self-pay | Admitting: Hematology and Oncology

## 2023-11-19 ENCOUNTER — Ambulatory Visit: Payer: Self-pay | Admitting: *Deleted

## 2023-11-19 ENCOUNTER — Other Ambulatory Visit: Payer: Self-pay | Admitting: Hematology and Oncology

## 2023-11-19 ENCOUNTER — Other Ambulatory Visit: Payer: Self-pay | Admitting: *Deleted

## 2023-11-19 MED ORDER — ERGOCALCIFEROL 1.25 MG (50000 UT) PO CAPS: 50000.0000 [IU] | ORAL_CAPSULE | ORAL | 1 refills | Status: AC

## 2023-11-19 NOTE — Telephone Encounter (Signed)
-----   Message from Morna JAYSON Kendall sent at 11/13/2023  6:53 AM EDT ----- Regarding: FW: Patient vitamin d  level is low.  Will you see if she is taking any supplementation and of not I recommend ergocalciferol  50,000 units po weekly disp 12 1 refill sent into her pharmacy.  If she is taking any otc she should stop it and take the prescription. ----- Message ----- From: Rebecka, [facility] [place] Sent: 11/12/2023   2:34 PM EDT To: Morna Dalton Kendall, NP

## 2023-11-19 NOTE — Telephone Encounter (Signed)
 Per Morna Kendall, NP, called pt with message below. Pt verbalized understanding and VIT D rx was sent to pt pharmacy

## 2023-11-26 ENCOUNTER — Other Ambulatory Visit: Payer: Self-pay | Admitting: Hematology and Oncology

## 2023-11-26 ENCOUNTER — Encounter: Payer: Self-pay | Admitting: Hematology and Oncology

## 2023-11-26 MED ORDER — GABAPENTIN 300 MG PO CAPS: 300.0000 mg | ORAL_CAPSULE | Freq: Three times a day (TID) | ORAL | 6 refills | Status: DC

## 2023-11-26 NOTE — Progress Notes (Signed)
 Chemo-induced peripheral neuropathy: Sent a prescription for gabapentin  3 mg p.o. twice daily.  She will start at 300 mg at bedtime and then increase it to twice daily.

## 2023-12-04 ENCOUNTER — Ambulatory Visit: Payer: Self-pay | Attending: Adult Health | Admitting: Physical Therapy

## 2023-12-04 ENCOUNTER — Inpatient Hospital Stay: Attending: Hematology and Oncology

## 2023-12-04 ENCOUNTER — Inpatient Hospital Stay

## 2023-12-04 ENCOUNTER — Inpatient Hospital Stay (HOSPITAL_BASED_OUTPATIENT_CLINIC_OR_DEPARTMENT_OTHER): Admitting: Nurse Practitioner

## 2023-12-04 ENCOUNTER — Encounter: Payer: Self-pay | Admitting: Nurse Practitioner

## 2023-12-04 VITALS — BP 130/88 | HR 83 | Temp 97.6°F | Resp 17 | Wt 278.1 lb

## 2023-12-04 DIAGNOSIS — G629 Polyneuropathy, unspecified: Secondary | ICD-10-CM | POA: Diagnosis not present

## 2023-12-04 DIAGNOSIS — C50212 Malignant neoplasm of upper-inner quadrant of left female breast: Secondary | ICD-10-CM | POA: Insufficient documentation

## 2023-12-04 DIAGNOSIS — Z79899 Other long term (current) drug therapy: Secondary | ICD-10-CM | POA: Diagnosis not present

## 2023-12-04 DIAGNOSIS — Z5112 Encounter for antineoplastic immunotherapy: Secondary | ICD-10-CM | POA: Insufficient documentation

## 2023-12-04 DIAGNOSIS — Z803 Family history of malignant neoplasm of breast: Secondary | ICD-10-CM | POA: Insufficient documentation

## 2023-12-04 DIAGNOSIS — R5383 Other fatigue: Secondary | ICD-10-CM | POA: Insufficient documentation

## 2023-12-04 DIAGNOSIS — Z1721 Progesterone receptor positive status: Secondary | ICD-10-CM | POA: Insufficient documentation

## 2023-12-04 DIAGNOSIS — Z8041 Family history of malignant neoplasm of ovary: Secondary | ICD-10-CM | POA: Diagnosis not present

## 2023-12-04 DIAGNOSIS — Z95828 Presence of other vascular implants and grafts: Secondary | ICD-10-CM

## 2023-12-04 DIAGNOSIS — Z800 Family history of malignant neoplasm of digestive organs: Secondary | ICD-10-CM | POA: Diagnosis not present

## 2023-12-04 DIAGNOSIS — Z170 Estrogen receptor positive status [ER+]: Secondary | ICD-10-CM

## 2023-12-04 DIAGNOSIS — Z1731 Human epidermal growth factor receptor 2 positive status: Secondary | ICD-10-CM | POA: Diagnosis not present

## 2023-12-04 LAB — CMP (CANCER CENTER ONLY)
ALT: 16 U/L (ref 0–44)
AST: 33 U/L (ref 15–41)
Albumin: 3.5 g/dL (ref 3.5–5.0)
Alkaline Phosphatase: 121 U/L (ref 38–126)
Anion gap: 6 (ref 5–15)
BUN: 11 mg/dL (ref 6–20)
CO2: 28 mmol/L (ref 22–32)
Calcium: 9.1 mg/dL (ref 8.9–10.3)
Chloride: 106 mmol/L (ref 98–111)
Creatinine: 0.64 mg/dL (ref 0.44–1.00)
GFR, Estimated: >60 mL/min (ref >60)
Glucose, Bld: 120 mg/dL — ABNORMAL HIGH (ref 70–99)
Potassium: 3.9 mmol/L (ref 3.5–5.1)
Sodium: 140 mmol/L (ref 135–145)
Total Bilirubin: 0.7 mg/dL (ref 0.0–1.2)
Total Protein: 7.4 g/dL (ref 6.5–8.1)

## 2023-12-04 LAB — CBC WITH DIFFERENTIAL (CANCER CENTER ONLY)
Abs Immature Granulocytes: 0.02 K/uL (ref 0.00–0.07)
Basophils Absolute: 0.1 K/uL (ref 0.0–0.1)
Basophils Relative: 1 %
Eosinophils Absolute: 0.2 K/uL (ref 0.0–0.5)
Eosinophils Relative: 3 %
HCT: 28.7 % — ABNORMAL LOW (ref 36.0–46.0)
Hemoglobin: 8.7 g/dL — ABNORMAL LOW (ref 12.0–15.0)
Immature Granulocytes: 0 %
Lymphocytes Relative: 32 %
Lymphs Abs: 2.1 K/uL (ref 0.7–4.0)
MCH: 22.5 pg — ABNORMAL LOW (ref 26.0–34.0)
MCHC: 30.3 g/dL (ref 30.0–36.0)
MCV: 74.2 fL — ABNORMAL LOW (ref 80.0–100.0)
Monocytes Absolute: 0.4 K/uL (ref 0.1–1.0)
Monocytes Relative: 6 %
Neutro Abs: 3.9 K/uL (ref 1.7–7.7)
Neutrophils Relative %: 58 %
Platelet Count: 225 K/uL (ref 150–400)
RBC: 3.87 MIL/uL (ref 3.87–5.11)
RDW: 19.6 % — ABNORMAL HIGH (ref 11.5–15.5)
WBC Count: 6.6 K/uL (ref 4.0–10.5)
nRBC: 0 % (ref 0.0–0.2)

## 2023-12-04 LAB — VITAMIN B12: Vitamin B-12: 408 pg/mL (ref 180–914)

## 2023-12-04 MED ORDER — ACETAMINOPHEN 325 MG PO TABS
650.0000 mg | ORAL_TABLET | Freq: Once | ORAL | Status: AC
  Administered 2023-12-04: 650 mg via ORAL
  Filled 2023-12-04: qty 2

## 2023-12-04 MED ORDER — SODIUM CHLORIDE 0.9 % IV SOLN
INTRAVENOUS | Status: DC
Start: 2023-12-04 — End: 2023-12-04

## 2023-12-04 MED ORDER — SODIUM CHLORIDE 0.9% FLUSH
10.0000 mL | Freq: Once | INTRAVENOUS | Status: AC
  Administered 2023-12-04: 10 mL

## 2023-12-04 MED ORDER — HEPARIN SOD (PORK) LOCK FLUSH 100 UNIT/ML IV SOLN
500.0000 [IU] | Freq: Once | INTRAVENOUS | Status: AC | PRN
Start: 2023-12-04 — End: 2023-12-04
  Administered 2023-12-04: 500 [IU]

## 2023-12-04 MED ORDER — SODIUM CHLORIDE 0.9 % IV SOLN
3.6000 mg/kg | Freq: Once | INTRAVENOUS | Status: AC
  Administered 2023-12-04: 480 mg via INTRAVENOUS
  Filled 2023-12-04: qty 24

## 2023-12-04 MED ORDER — DIPHENHYDRAMINE HCL 25 MG PO CAPS
25.0000 mg | ORAL_CAPSULE | Freq: Once | ORAL | Status: AC
  Administered 2023-12-04: 25 mg via ORAL
  Filled 2023-12-04: qty 1

## 2023-12-04 MED ORDER — SODIUM CHLORIDE 0.9% FLUSH
10.0000 mL | INTRAVENOUS | Status: DC | PRN
  Administered 2023-12-04: 10 mL

## 2023-12-04 MED ORDER — PROCHLORPERAZINE MALEATE 10 MG PO TABS
10.0000 mg | ORAL_TABLET | Freq: Once | ORAL | Status: AC
  Administered 2023-12-04: 10 mg via ORAL
  Filled 2023-12-04: qty 1

## 2023-12-04 NOTE — Patient Instructions (Signed)
 [facility] - A DEPT OF [facility]  Discharge Instructions: Thank you for choosing [facility] to provide your oncology and hematology care.   If you have a lab appointment with the Cancer Center, please go directly to the Cancer Center and check in at the registration area.   Wear comfortable clothing and clothing appropriate for easy access to any Portacath or PICC line.   We strive to give you quality time with your provider. You may need to reschedule your appointment if you arrive late (15 or more minutes).  Arriving late affects you and other patients whose appointments are after yours.  Also, if you miss three or more appointments without notifying the office, you may be dismissed from the clinic at the provider's discretion.      For prescription refill requests, have your pharmacy contact our office and allow 72 hours for refills to be completed.    Today you received the following chemotherapy and/or immunotherapy agents: Kadcyla      To help prevent nausea and vomiting after your treatment, we encourage you to take your nausea medication as directed.  BELOW ARE SYMPTOMS THAT SHOULD BE REPORTED IMMEDIATELY: *FEVER GREATER THAN 100.4 F (38 C) OR HIGHER *CHILLS OR SWEATING *NAUSEA AND VOMITING THAT IS NOT CONTROLLED WITH YOUR NAUSEA MEDICATION *UNUSUAL SHORTNESS OF BREATH *UNUSUAL BRUISING OR BLEEDING *URINARY PROBLEMS (pain or burning when urinating, or frequent urination) *BOWEL PROBLEMS (unusual diarrhea, constipation, pain near the anus) TENDERNESS IN MOUTH AND THROAT WITH OR WITHOUT PRESENCE OF ULCERS (sore throat, sores in mouth, or a toothache) UNUSUAL RASH, SWELLING OR PAIN  UNUSUAL VAGINAL DISCHARGE OR ITCHING   Items with * indicate a potential emergency and should be followed up as soon as possible or go to the Emergency Department if any problems should occur.  Please show the CHEMOTHERAPY ALERT CARD or IMMUNOTHERAPY  ALERT CARD at check-in to the Emergency Department and triage nurse.  Should you have questions after your visit or need to cancel or reschedule your appointment, please contact [facility] - A DEPT OF [facility]  Dept: [phone]  and follow the prompts.  Office hours are 8:00 a.m. to 4:30 p.m. Monday - Friday. Please note that voicemails left after 4:00 p.m. may not be returned until the following business day.  We are closed weekends and major holidays. You have access to a nurse at all times for urgent questions. Please call the main number to the clinic Dept: [phone] and follow the prompts.   For any non-urgent questions, you may also contact your provider using MyChart. We now offer e-Visits for anyone 29 and older to request care online for non-urgent symptoms. For details visit [email or web address].   Also download the MyChart app! Go to the app store, search "MyChart", open the app, select , and log in with your MyChart username and password.

## 2023-12-04 NOTE — Progress Notes (Signed)
 [facility]   Telephone:([phone] Fax:([phone]) [phone]    Patient Care Team: Zollie Lowers, MD as PCP - General (Family Medicine) [name], MD as Consulting Physician (General Surgery) [name], MD as Consulting Physician (Hematology and Oncology) Izell Domino, MD as Attending Physician (Radiation Oncology)   CHIEF COMPLAINT: Follow-up breast cancer  Oncology History  Malignant neoplasm of upper-inner quadrant of left breast in female, estrogen receptor positive (HCC)  06/06/2022 Mammogram   Diagnostic mammogram given palpable mass showed suspicious spiculated left breast mass with surrounding nodularity at 9 o'clock position.  This measures up to 3.1 cm sonographically however due to deep location and patient body habitus the mammographic measurement of 5.8 cm is felt to be more accurate.  At least 2 abnormal lymph nodes in the left axilla.  Possible abnormal palpable left supraclavicular lymph node.   06/06/2022 Breast US    Breast ultrasound confirmed these findings.   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  Prognostic showed ER 65% weak to strong staining, PR 15% weak to moderate staining, Ki-67 of 50% and HER2 positive by IHC at 3+    Genetic Testing   [facility] Custom Panel+RNA was Negative. Report date is 06/28/2022.   The Custom Hereditary Cancers Panel offered by [employer] includes sequencing and/or deletion duplication testing of the following 43 genes: APC, ATM, AXIN2, BAP1, BARD1, BMPR1A, BRCA1, BRCA2, BRIP1, CDH1, CDK4, CDKN2A (p14ARF and p16INK4a only), CHEK2, CTNNA1, EPCAM (Deletion/duplication testing only), FH, GREM1 (promoter region duplication testing only), HOXB13, KIT, MBD4, MEN1, MLH1, MSH2, MSH3, MSH6, MUTYH, NF1, NHTL1, PALB2, PDGFRA, PMS2, POLD1, POLE, PTEN,  RAD51C, RAD51D, SMAD4, SMARCA4. STK11, TP53, TSC1, TSC2, and VHL.    06/21/2022 Cancer Staging   Staging form: Breast, AJCC 8th Edition - Clinical stage from 06/21/2022: Stage IV (cT3, cN3, cM1, G3, ER+, PR+, HER2+) - Signed by [name], MD on 09/17/2023 Stage prefix: Initial diagnosis Histologic grading system: 3 grade system Stage used in treatment planning: Yes National guidelines used in treatment planning: Yes Type of national guideline used in treatment planning: NCCN   06/29/2022 PET scan   IMPRESSION: 1. Hypermetabolic left breast mass, compatible with primary breast malignancy. 2. Hypermetabolic left cervical, axillary, subpectoral, supraclavicular and prevascular mediastinal lymph nodes, compatible with nodal metastatic disease. 3. No evidence of metastatic disease in the abdomen or pelvis. 4. Aortic Atherosclerosis (ICD10-I70.0).     Electronically Signed   By: [name] M.D.   On: 06/29/2022 09:32   07/11/2022 - 01/16/2023 Chemotherapy   Patient is on Treatment Plan : BREAST DOCEtaxel  + Trastuzumab  + Pertuzumab  (THP) q21d x 8 cycles / Trastuzumab  + Pertuzumab  q21d x 4 cycles     02/01/2023 - 06/29/2023 Chemotherapy   Patient is on Treatment Plan : BREAST METASTATIC Fam-Trastuzumab Deruxtecan-nxki  (Enhertu ) (5.4) q21d     10/02/2023 -  Chemotherapy   Patient is on Treatment Plan : BREAST ADO-Trastuzumab Emtansine  (Kadcyla ) q21d        CURRENT THERAPY: Kadcyla  q. 21 days  INTERVAL HISTORY [name] returns for follow-up and treatment as scheduled, last seen by Dr. Loretha 11/12/23 with cycle 3 Kadcyla , main side effect is fatigue which is mostly moderate but severe at times, she continues working 40 hours weekly for [employer] which is stressful.  She has little stamina to do much exercise.  She  has been in pain at the mastectomy site and limb pain what feels like shinsplints.  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  She is wearing  gloves today because she will apply ice mitts and socks for treatment.  Small opening at the mastectomy site continues to heal, no signs of infection.  ROS  All other systems reviewed and negative  Past Medical History:  Diagnosis Date   Abdominal hernia    Anemia    b12 deficiency   Anxiety    Arthritis    B12 deficiency    Back pain    Cancer (HCC)    breast cancer   Depression    Family history of adverse reaction to anesthesia    mother vomits after anesthesia   Gallbladder problem    GERD (gastroesophageal reflux disease)    History of hiatal hernia    small per patient   Hypertension    Hypothyroidism    Joint pain    Knee pain    Palpitation    when iron is low   Pernicious anemia    SOB (shortness of breath)    Thyroid  disease    hypothyroidism   Vitamin D  deficiency      Past Surgical History:  Procedure Laterality Date   BREAST BIOPSY Left 06/09/2022   US  LT BREAST BX W LOC DEV 1ST LESION IMG BX SPEC US  GUIDE 06/09/2022 [facility] MAMMOGRAPHY   CESAREAN SECTION  1999   Dilatation and currettagement     for miscarriage 18 years ago   PORTACATH PLACEMENT Right 07/05/2022   Procedure: INSERTION PORT-A-CATH;  Surgeon: [name], MD;  Location: [facility] OR;  Service: General;  Laterality: Right;   SIMPLE MASTECTOMY WITH AXILLARY SENTINEL NODE BIOPSY Bilateral 07/26/2023   Procedure: LEFT SKIN REDUCING MASTECTOMY, RIGHT SKIN SPARING, RISK REDUCING MASTECTOMY;  Surgeon: [name], MD;  Location: [facility] OR;  Service: General;  Laterality: Bilateral;  PEC BLOCK     Outpatient Encounter Medications as of 12/04/2023  Medication Sig   acetaminophen  (TYLENOL ) 325 MG tablet Take 2 tablets (650 mg total) by mouth every 6 (six) hours as needed for mild pain (pain score 1-3) (or Fever >/= 101).   carvedilol  (COREG ) 3.125 MG tablet TAKE ONE TABLET TWICE DAILY WITH MEAL(S)   cyanocobalamin  (VITAMIN B12) 1000 MCG/ML injection INJECT 1 ML INTRAMUSCULARLY EVERY 15 DAYS (Patient  taking differently: Inject 1,000 mcg into the skin every 30 (thirty) days.)   cycloSPORINE  (RESTASIS ) 0.05 % ophthalmic emulsion Place 1 drop into both eyes 2 (two) times daily.   EPINEPHrine  0.3 mg/0.3 mL IJ SOAJ injection Inject 0.3 mg into the muscle as needed for anaphylaxis.   ergocalciferol  (VITAMIN D2) 1.25 MG (50000 UT) capsule Take 1 capsule (50,000 Units total) by mouth once a week.   escitalopram  (LEXAPRO ) 20 MG tablet Take 1 tablet (20 mg total) by mouth daily. Please take citalopram 10 mg( half pill) only for next 3 days starting from tomorrow.  Then stop it until you finish the course of linezolid .  Can resume previous dose after  2 days of finishing linezolid    gabapentin  (NEURONTIN ) 300 MG capsule Take 1 capsule (300 mg total) by mouth 3 (three) times daily.   LORazepam  (ATIVAN ) 0.5 MG tablet Take 1 tablet (0.5 mg total) by mouth every 8 (eight) hours as needed for anxiety.   oxyCODONE  (OXY IR/ROXICODONE ) 5 MG immediate release tablet Take 1 tablet (5 mg total) by mouth every 6 (six)  hours as needed for severe pain (pain score 7-10). (Patient not taking: Reported on 10/06/2023)   pantoprazole  (PROTONIX ) 40 MG tablet Take 1 tablet (40 mg total) by mouth daily. (Patient taking differently: Take 40 mg by mouth See admin instructions. Take 40 mg by mouth in the morning 30 minutes before breakfast and an additional 40 mg in the evening as needed for unresolved acid reflux symptoms)   potassium chloride  (KLOR-CON ) 10 MEQ tablet TAKE TWO TABLETS DAILY AS DIRECTED   No facility-administered encounter medications on file as of 12/04/2023.     Today's Vitals   12/04/23 1055  BP: 130/88  Pulse: 83  Resp: 17  Temp: 97.6 F (36.4 C)  SpO2: 99%  Weight: 278 lb 1.6 oz (126.1 kg)   Body mass index is 52.55 kg/m.   ECOG PERFORMANCE STATUS: 1 - Symptomatic but completely ambulatory  PHYSICAL EXAM GENERAL:alert, no distress and comfortable SKIN: no rash  EYES: sclera clear LUNGS: clear  with normal breathing effort HEART: regular rate & rhythm NEURO: alert & oriented x 3 with fluent speech Breast exam: Visual inspection of the right mastectomy reveals a small opening, no erythema or drainage PAC without erythema    CBC    Latest Ref Rng & Units 12/04/2023   10:00 AM 11/12/2023   11:59 AM 10/23/2023   12:15 PM  CBC  WBC 4.0 - 10.5 K/uL 6.6  6.0  6.2   Hemoglobin 12.0 - 15.0 g/dL 8.7  8.3  8.8   Hematocrit 36.0 - 46.0 % 28.7  26.6  28.8   Platelets 150 - 400 K/uL 225  205  279       CMP     Latest Ref Rng & Units 12/04/2023   10:00 AM 11/12/2023   11:59 AM 10/23/2023   12:15 PM  CMP  Glucose 70 - 99 mg/dL 879  897  90   BUN 6 - 20 mg/dL 11  12  10    Creatinine 0.44 - 1.00 mg/dL 9.35  9.45  9.41   Sodium 135 - 145 mmol/L 140  142  141   Potassium 3.5 - 5.1 mmol/L 3.9  3.8  3.8   Chloride 98 - 111 mmol/L 106  108  107   CO2 22 - 32 mmol/L 28  28  29    Calcium 8.9 - 10.3 mg/dL 9.1  9.0  9.1   Total Protein 6.5 - 8.1 g/dL 7.4  7.4  7.8   Total Bilirubin 0.0 - 1.2 mg/dL 0.7  0.5  0.4   Alkaline Phos 38 - 126 U/L 121  109  107   AST 15 - 41 U/L 33  31  27   ALT 0 - 44 U/L 16  14  13        ASSESSMENT & PLAN: 53 year old female  Malignant neoplasm of upper-inner quadrant of left breast in female, estrogen receptor positive, Stage IV with nodal metastasis at diagnosis -S/p neoadjuvant chemo THP x5 cycles, followed by surgery and maintenance herceptin /perjeta  -07/26/23 bilateral mastectomy showing complete pathologic response ypT0 ypNX -Given concern for local progression in the left breast with biopsy confirming high-grade residual invasive mammary carcinoma, ER/PR negative HER2 positive, Ki-67 of 60% she switched to Enhertu , last dose 06/2023 with no evidence of disease - Switched to Kadcyla  maintenance 10/02/23 - Ms. Gilkey appears stable, s/p cycle 3 Kadcyla , tolerating well overall mainly with fatigue.  We reviewed management strategies.  Neuropathy has improved.  She is  able to recover and function with  adequate performance status and continue working.  No clinical evidence of disease progression PLAN - Labs reviewed - Proceed with cycle 4 Kadcyla  today as scheduled, no dose adjustments.  Will reduce Benadryl  to 25 mg due to excessive drowsiness and patient's preference - Reviewed symptom management for fatigue - Follow-up and cycle 5 in 3 weeks as scheduled, or sooner if needed   All questions were answered. The patient knows to call the clinic with any problems, questions or concerns. No barriers to learning were detected.   Fortunato Nordin K Zade Falkner, NP 12/04/2023

## 2023-12-07 ENCOUNTER — Inpatient Hospital Stay: Admitting: Physician Assistant

## 2023-12-07 ENCOUNTER — Other Ambulatory Visit: Payer: Self-pay | Admitting: *Deleted

## 2023-12-07 ENCOUNTER — Inpatient Hospital Stay

## 2023-12-07 ENCOUNTER — Encounter: Payer: Self-pay | Admitting: Physician Assistant

## 2023-12-07 ENCOUNTER — Telehealth: Payer: Self-pay

## 2023-12-07 VITALS — BP 134/51 | HR 101 | Temp 99.0°F | Resp 16

## 2023-12-07 VITALS — BP 146/69 | HR 99 | Temp 101.0°F | Resp 16 | Ht 61.0 in | Wt 272.0 lb

## 2023-12-07 DIAGNOSIS — Z5112 Encounter for antineoplastic immunotherapy: Secondary | ICD-10-CM | POA: Diagnosis not present

## 2023-12-07 DIAGNOSIS — Z170 Estrogen receptor positive status [ER+]: Secondary | ICD-10-CM

## 2023-12-07 DIAGNOSIS — R509 Fever, unspecified: Secondary | ICD-10-CM

## 2023-12-07 DIAGNOSIS — C50212 Malignant neoplasm of upper-inner quadrant of left female breast: Secondary | ICD-10-CM

## 2023-12-07 DIAGNOSIS — Z95828 Presence of other vascular implants and grafts: Secondary | ICD-10-CM

## 2023-12-07 DIAGNOSIS — D509 Iron deficiency anemia, unspecified: Secondary | ICD-10-CM | POA: Diagnosis not present

## 2023-12-07 LAB — CBC WITH DIFFERENTIAL (CANCER CENTER ONLY)
Abs Immature Granulocytes: 0.04 K/uL (ref 0.00–0.07)
Basophils Absolute: 0 K/uL (ref 0.0–0.1)
Basophils Relative: 1 %
Eosinophils Absolute: 0.1 K/uL (ref 0.0–0.5)
Eosinophils Relative: 2 %
HCT: 29.8 % — ABNORMAL LOW (ref 36.0–46.0)
Hemoglobin: 9.1 g/dL — ABNORMAL LOW (ref 12.0–15.0)
Immature Granulocytes: 1 %
Lymphocytes Relative: 11 %
Lymphs Abs: 0.5 K/uL — ABNORMAL LOW (ref 0.7–4.0)
MCH: 22.5 pg — ABNORMAL LOW (ref 26.0–34.0)
MCHC: 30.5 g/dL (ref 30.0–36.0)
MCV: 73.6 fL — ABNORMAL LOW (ref 80.0–100.0)
Monocytes Absolute: 0.4 K/uL (ref 0.1–1.0)
Monocytes Relative: 8 %
Neutro Abs: 3.7 K/uL (ref 1.7–7.7)
Neutrophils Relative %: 77 %
Platelet Count: 198 K/uL (ref 150–400)
RBC: 4.05 MIL/uL (ref 3.87–5.11)
RDW: 19.9 % — ABNORMAL HIGH (ref 11.5–15.5)
WBC Count: 4.7 K/uL (ref 4.0–10.5)
nRBC: 0 % (ref 0.0–0.2)

## 2023-12-07 LAB — RESP PANEL BY RT-PCR (RSV, FLU A&B, COVID)  RVPGX2
Influenza A by PCR: NEGATIVE
Influenza B by PCR: NEGATIVE
Resp Syncytial Virus by PCR: NEGATIVE
SARS Coronavirus 2 by RT PCR: NEGATIVE

## 2023-12-07 LAB — CMP (CANCER CENTER ONLY)
ALT: 21 U/L (ref 0–44)
AST: 60 U/L — ABNORMAL HIGH (ref 15–41)
Albumin: 3.7 g/dL (ref 3.5–5.0)
Alkaline Phosphatase: 136 U/L — ABNORMAL HIGH (ref 38–126)
Anion gap: 7 (ref 5–15)
BUN: 9 mg/dL (ref 6–20)
CO2: 28 mmol/L (ref 22–32)
Calcium: 9.2 mg/dL (ref 8.9–10.3)
Chloride: 102 mmol/L (ref 98–111)
Creatinine: 0.74 mg/dL (ref 0.44–1.00)
GFR, Estimated: >60 mL/min (ref >60)
Glucose, Bld: 87 mg/dL (ref 70–99)
Potassium: 3.3 mmol/L — ABNORMAL LOW (ref 3.5–5.1)
Sodium: 137 mmol/L (ref 135–145)
Total Bilirubin: 0.8 mg/dL (ref 0.0–1.2)
Total Protein: 8 g/dL (ref 6.5–8.1)

## 2023-12-07 LAB — IRON AND IRON BINDING CAPACITY (CC-WL,HP ONLY)
Iron: 85 ug/dL (ref 28–170)
Saturation Ratios: 18 % (ref 10.4–31.8)
TIBC: 466 ug/dL — ABNORMAL HIGH (ref 250–450)
UIBC: 381 ug/dL (ref 148–442)

## 2023-12-07 LAB — FERRITIN: Ferritin: 159 ng/mL (ref 11–307)

## 2023-12-07 MED ORDER — SODIUM CHLORIDE 0.9% FLUSH
10.0000 mL | Freq: Once | INTRAVENOUS | Status: AC
  Administered 2023-12-07: 10 mL

## 2023-12-07 MED ORDER — HEPARIN SOD (PORK) LOCK FLUSH 100 UNIT/ML IV SOLN
500.0000 [IU] | Freq: Once | INTRAVENOUS | Status: AC
Start: 2023-12-07 — End: 2023-12-07
  Administered 2023-12-07: 500 [IU]

## 2023-12-07 MED ORDER — SODIUM CHLORIDE 0.9 % IV SOLN: INTRAVENOUS | Status: AC

## 2023-12-07 MED ORDER — POTASSIUM CHLORIDE CRYS ER 20 MEQ PO TBCR
20.0000 meq | EXTENDED_RELEASE_TABLET | Freq: Once | ORAL | Status: AC
  Administered 2023-12-07: 20 meq via ORAL
  Filled 2023-12-07: qty 1

## 2023-12-07 NOTE — Telephone Encounter (Signed)
 Pt called today stating she has spiked high fevers the past two nights, tmax 102.0 F oral. She reports feeling achey and hot/cold flashes. She had Kadcyla  7/15 and saw Lacie Burton, NP that day as well. She was previously hospitalized after experiencing the same sx after having Kadcyla  but there was no definitive answer as to why this was happening. She has not received GCSF. Denies fever at this moment but is feeling unwell and asks to be seen for possible fluids. No N/V/D, sore throat, cough, SHOB currently.  S/w [place] Police, PA regarding this pt sx and she is agreeable to see pt today for [facility] visit at 1400 with a PF W/LAB at 1330. Pt will receive IVF after meeting with PA, OK per Charge RN, Levon.

## 2023-12-07 NOTE — Progress Notes (Signed)
 [facility] Telephone:(336) [phone]   Fax:(336) [phone]  PROGRESS NOTE  Patient Care Team: Zollie Lowers, MD as PCP - General (Family Medicine) [name], MD as Consulting Physician (General Surgery) [name], MD as Consulting Physician (Hematology and Oncology) Izell Domino, MD as Attending Physician (Radiation Oncology)   CHIEF COMPLAINTS/PURPOSE OF CONSULTATION:  Fever and chills  Oncology History  Malignant neoplasm of upper-inner quadrant of left breast in female, estrogen receptor positive (HCC)  06/06/2022 Mammogram   Diagnostic mammogram given palpable mass showed suspicious spiculated left breast mass with surrounding nodularity at 9 o'clock position.  This measures up to 3.1 cm sonographically however due to deep location and patient body habitus the mammographic measurement of 5.8 cm is felt to be more accurate.  At least 2 abnormal lymph nodes in the left axilla.  Possible abnormal palpable left supraclavicular lymph node.   06/06/2022 Breast US    Breast ultrasound confirmed these findings.   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  Prognostic showed ER 65% weak to strong staining, PR 15% weak to moderate staining, Ki-67 of 50% and HER2 positive by IHC at 3+    Genetic Testing   [facility] Custom Panel+RNA was Negative. Report date is 06/28/2022.   The Custom Hereditary Cancers Panel offered by [facility] includes sequencing and/or deletion duplication testing of the following 43 genes: APC, ATM, AXIN2, BAP1, BARD1, BMPR1A, BRCA1, BRCA2, BRIP1, CDH1, CDK4, CDKN2A (p14ARF and p16INK4a only), CHEK2, CTNNA1, EPCAM (Deletion/duplication testing only), FH, GREM1 (promoter region duplication testing only), HOXB13, KIT, MBD4, MEN1, MLH1, MSH2, MSH3, MSH6, MUTYH, NF1, NHTL1, PALB2, PDGFRA,  PMS2, POLD1, POLE, PTEN, RAD51C, RAD51D, SMAD4, SMARCA4. STK11, TP53, TSC1, TSC2, and VHL.    06/21/2022 Cancer Staging   Staging form: Breast, AJCC 8th Edition - Clinical stage from 06/21/2022: Stage IV (cT3, cN3, cM1, G3, ER+, PR+, HER2+) - Signed by [name], MD on 09/17/2023 Stage prefix: Initial diagnosis Histologic grading system: 3 grade system Stage used in treatment planning: Yes National guidelines used in treatment planning: Yes Type of national guideline used in treatment planning: NCCN   06/29/2022 PET scan   IMPRESSION: 1. Hypermetabolic left breast mass, compatible with primary breast malignancy. 2. Hypermetabolic left cervical, axillary, subpectoral, supraclavicular and prevascular mediastinal lymph nodes, compatible with nodal metastatic disease. 3. No evidence of metastatic disease in the abdomen or pelvis. 4. Aortic Atherosclerosis (ICD10-I70.0).     Electronically Signed   By: [name] M.D.   On: 06/29/2022 09:32   07/11/2022 - 01/16/2023 Chemotherapy   Patient is on Treatment Plan : BREAST DOCEtaxel  + Trastuzumab  + Pertuzumab  (THP) q21d x 8 cycles / Trastuzumab  + Pertuzumab  q21d x 4 cycles     02/01/2023 - 06/29/2023 Chemotherapy   Patient is on Treatment Plan : BREAST METASTATIC Fam-Trastuzumab Deruxtecan-nxki  (Enhertu ) (5.4) q21d     10/02/2023 -  Chemotherapy   Patient is on Treatment Plan : BREAST ADO-Trastuzumab Emtansine  (Kadcyla ) q21d        HISTORY OF PRESENTING ILLNESS:  Diamond Marshall 53 y.o. female who is under the care of Dr. Loretha for breast cancer. She is currently receiving Kadcyla  every 21 days, last treatment on 12/04/2023.  Diamond Marshall reports having fevers for 2 to 3 days after each Kadcyla  infusion.  She was admitted after cycle 1 for fever without etiology identified.  She  was sent home with oral Augmentin  and doxycycline  with probiotics.  Since the hospitalization, patient again developed fevers lasting 2 to 3 days after each cycle  with a Tmax of 102 degrees.    Diamond Marshall called the clinic earlier today reporting she developed a fever for the past 2 nights with a Tmax of 102 F.  She does have some diffuse achiness with hot/cold flashes.  She denies any infectious symptoms including cough, sore throat, sinus congestion, shortness of breath or urinary symptoms.  She has yet to try Tylenol  for her fevers.  She is experiencing nausea for approximately 1 week after each treatment.  She did have 1 episode of vomiting this past cycle.  She denies easy bruising or signs of active bleeding.  She has no other complaints.  Rest of the ROS as below.   MEDICAL HISTORY:  Past Medical History:  Diagnosis Date   Abdominal hernia    Anemia    b12 deficiency   Anxiety    Arthritis    B12 deficiency    Back pain    Cancer (HCC)    breast cancer   Depression    Family history of adverse reaction to anesthesia    mother vomits after anesthesia   Gallbladder problem    GERD (gastroesophageal reflux disease)    History of hiatal hernia    small per patient   Hypertension    Hypothyroidism    Joint pain    Knee pain    Palpitation    when iron is low   Pernicious anemia    SOB (shortness of breath)    Thyroid  disease    hypothyroidism   Vitamin D  deficiency     SURGICAL HISTORY: Past Surgical History:  Procedure Laterality Date   BREAST BIOPSY Left 06/09/2022   US  LT BREAST BX W LOC DEV 1ST LESION IMG BX SPEC US  GUIDE 06/09/2022 [facility] MAMMOGRAPHY   CESAREAN SECTION  1999   Dilatation and currettagement     for miscarriage 18 years ago   PORTACATH PLACEMENT Right 07/05/2022   Procedure: INSERTION PORT-A-CATH;  Surgeon: [name], MD;  Location: [facility] OR;  Service: General;  Laterality: Right;   SIMPLE MASTECTOMY WITH AXILLARY SENTINEL NODE BIOPSY Bilateral 07/26/2023   Procedure: LEFT SKIN REDUCING MASTECTOMY, RIGHT SKIN SPARING, RISK REDUCING MASTECTOMY;  Surgeon: [name], MD;  Location: [facility] OR;  Service:  General;  Laterality: Bilateral;  PEC BLOCK    SOCIAL HISTORY: Social History   Socioeconomic History   Marital status: Married    Spouse name: Garrel   Number of children: Not on file   Years of education: Not on file   Highest education level: Not on file  Occupational History   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  Tobacco Use   Smoking status: Never   Smokeless tobacco: Never  Vaping Use   Vaping status: Never Used  Substance and Sexual Activity   Alcohol use: No   Drug use: No   Sexual activity: Yes    Birth control/protection: Pill  Other Topics Concern   Not on file  Social History Narrative   Not on file   Social Drivers of Health   Financial Resource Strain: Not on file  Food Insecurity: No Food Insecurity (10/09/2023)   Hunger Vital Sign    Worried About Running Out of Food in the Last Year: Never true    Ran Out of Food in the Last Year: Never true  Transportation Needs: No Transportation Needs (10/09/2023)  PRAPARE - Administrator, Civil Service (Medical): No    Lack of Transportation (Non-Medical): No  Physical Activity: Not on file  Stress: Not on file  Social Connections: Moderately Integrated (07/26/2023)   Social Connection and Isolation Panel    Frequency of Communication with Friends and Family: Three times a week    Frequency of Social Gatherings with Friends and Family: Three times a week    Attends Religious Services: More than 4 times per year    Active Member of Clubs or Organizations: No    Attends Banker Meetings: Never    Marital Status: Married  Catering manager Violence: Not At Risk (10/09/2023)   Humiliation, Afraid, Rape, and Kick questionnaire    Fear of Current or Ex-Partner: No    Emotionally Abused: No    Physically Abused: No    Sexually Abused: No    FAMILY HISTORY: Family History  Problem Relation Age of Onset   Hypertension Mother    Diabetes Mother    High Cholesterol Mother    Thyroid   disease Mother    Depression Mother    Anxiety disorder Mother    Obesity Mother    CVA Father        hemorrhagic   Ovarian cancer Maternal Aunt 67   Colon cancer Maternal Aunt    Breast cancer Cousin 58       maternal first cousin, reports negative genetic testing    ALLERGIES:  is allergic to phentermine , bee venom, lactose intolerance (gi), peanut allergen powder-dnfp, topiramate , sulfa antibiotics, tape, and wound dressing adhesive.  MEDICATIONS:  Current Outpatient Medications  Medication Sig Dispense Refill   acetaminophen  (TYLENOL ) 325 MG tablet Take 2 tablets (650 mg total) by mouth every 6 (six) hours as needed for mild pain (pain score 1-3) (or Fever >/= 101). 60 tablet 0   carvedilol  (COREG ) 3.125 MG tablet TAKE ONE TABLET TWICE DAILY WITH MEAL(S) 60 tablet 0   cyanocobalamin  (VITAMIN B12) 1000 MCG/ML injection INJECT 1 ML INTRAMUSCULARLY EVERY 15 DAYS (Patient taking differently: Inject 1,000 mcg into the skin every 30 (thirty) days.) 30 mL 3   cycloSPORINE  (RESTASIS ) 0.05 % ophthalmic emulsion Place 1 drop into both eyes 2 (two) times daily. 5.5 mL 1   EPINEPHrine  0.3 mg/0.3 mL IJ SOAJ injection Inject 0.3 mg into the muscle as needed for anaphylaxis. 2 each 0   ergocalciferol  (VITAMIN D2) 1.25 MG (50000 UT) capsule Take 1 capsule (50,000 Units total) by mouth once a week. 12 capsule 1   escitalopram  (LEXAPRO ) 20 MG tablet Take 1 tablet (20 mg total) by mouth daily. Please take citalopram 10 mg( half pill) only for next 3 days starting from tomorrow.  Then stop it until you finish the course of linezolid .  Can resume previous dose after  2 days of finishing linezolid      gabapentin  (NEURONTIN ) 300 MG capsule Take 1 capsule (300 mg total) by mouth 3 (three) times daily. 60 capsule 6   LORazepam  (ATIVAN ) 0.5 MG tablet Take 1 tablet (0.5 mg total) by mouth every 8 (eight) hours as needed for anxiety. 30 tablet 1   oxyCODONE  (OXY IR/ROXICODONE ) 5 MG immediate release tablet Take 1  tablet (5 mg total) by mouth every 6 (six) hours as needed for severe pain (pain score 7-10). 15 tablet 0   pantoprazole  (PROTONIX ) 40 MG tablet Take 1 tablet (40 mg total) by mouth daily. (Patient taking differently: Take 40 mg by mouth See admin instructions. Take  40 mg by mouth in the morning 30 minutes before breakfast and an additional 40 mg in the evening as needed for unresolved acid reflux symptoms) 90 tablet 3   potassium chloride  (KLOR-CON ) 10 MEQ tablet TAKE TWO TABLETS DAILY AS DIRECTED 60 tablet 0   No current facility-administered medications for this visit.    REVIEW OF SYSTEMS:   Constitutional: (+ ) fevers, ( + )  chills , ( - ) night sweats Eyes: ( - ) blurriness of vision, ( - ) double vision, ( - ) watery eyes Ears, nose, mouth, throat, and face: ( - ) mucositis, ( - ) sore throat Respiratory: ( - ) cough, ( - ) dyspnea, ( - ) wheezes Cardiovascular: ( - ) palpitation, ( - ) chest discomfort, ( - ) lower extremity swelling Gastrointestinal:  ( + ) nausea, ( - ) heartburn, ( - ) change in bowel habits Skin: ( - ) abnormal skin rashes Lymphatics: ( - ) new lymphadenopathy, ( - ) easy bruising Neurological: ( - ) numbness, ( - ) tingling, ( - ) new weaknesses Behavioral/Psych: ( - ) mood change, ( - ) new changes  All other systems were reviewed with the patient and are negative.  PHYSICAL EXAMINATION: ECOG PERFORMANCE STATUS: 1 - Symptomatic but completely ambulatory  Vitals:   12/07/23 1418 12/07/23 1452  BP: (!) 146/69   Pulse: 99   Resp: 16   Temp: 98.3 F (36.8 C) (!) 101 F (38.3 C)  SpO2: 99%    Filed Weights   12/07/23 1418  Weight: 272 lb (123.4 kg)    GENERAL: well appearing female in NAD  SKIN: skin color, texture, turgor are normal, no rashes or significant lesions EYES: conjunctiva are pink and non-injected, sclera clear OROPHARYNX: no exudate, no erythema; lips, buccal mucosa, and tongue normal  NECK: supple, non-tender LUNGS: clear to  auscultation and percussion with normal breathing effort HEART: regular rate & rhythm and no murmurs and no lower extremity edema Musculoskeletal: no cyanosis of digits and no clubbing  PSYCH: alert & oriented x 3, fluent speech NEURO: no focal motor/sensory deficits  LABORATORY DATA:  I have reviewed the data as listed    Latest Ref Rng & Units 12/07/2023    1:36 PM 12/04/2023   10:00 AM 11/12/2023   11:59 AM  CBC  WBC 4.0 - 10.5 K/uL 4.7  6.6  6.0   Hemoglobin 12.0 - 15.0 g/dL 9.1  8.7  8.3   Hematocrit 36.0 - 46.0 % 29.8  28.7  26.6   Platelets 150 - 400 K/uL 198  225  205        Latest Ref Rng & Units 12/07/2023    1:36 PM 12/04/2023   10:00 AM 11/12/2023   11:59 AM  CMP  Glucose 70 - 99 mg/dL 87  879  897   BUN 6 - 20 mg/dL 9  11  12    Creatinine 0.44 - 1.00 mg/dL 9.25  9.35  9.45   Sodium 135 - 145 mmol/L 137  140  142   Potassium 3.5 - 5.1 mmol/L 3.3  3.9  3.8   Chloride 98 - 111 mmol/L 102  106  108   CO2 22 - 32 mmol/L 28  28  28    Calcium 8.9 - 10.3 mg/dL 9.2  9.1  9.0   Total Protein 6.5 - 8.1 g/dL 8.0  7.4  7.4   Total Bilirubin 0.0 - 1.2 mg/dL 0.8  0.7  0.5  Alkaline Phos 38 - 126 U/L 136  121  109   AST 15 - 41 U/L 60  33  31   ALT 0 - 44 U/L 21  16  14     RADIOGRAPHIC STUDIES: I have personally reviewed the radiological images as listed and agreed with the findings in the report. ECHOCARDIOGRAM COMPLETE Result Date: 11/09/2023    ECHOCARDIOGRAM REPORT   Patient Name:   Diamond Marshall Date of Exam: 11/09/2023 Medical Rec #:  [id number]     Height:       61.0 in Accession #:    [id number]    Weight:       276.0 lb Date of Birth:  22-Mar-1971     BSA:          2.166 m Patient Age:    52 years      BP:           130/60 mmHg Patient Gender: F             HR:           86 bpm. Exam Location:  [facility] Procedure: 2D Echo, 3D Echo, Cardiac Doppler, Color Doppler and Strain Analysis            (Both Spectral and Color Flow Doppler were utilized during            procedure).  Indications:    Chemo  History:        Patient has prior history of Echocardiogram examinations, most                 recent 06/27/2023.  Sonographer:    Therisa Crouch Referring Phys: [id number] PRAVEENA IRUKU IMPRESSIONS  1. Left ventricular ejection fraction, by estimation, is 55 to 60%. Left ventricular ejection fraction by 3D volume is 62 %. The left ventricle has normal function. The left ventricle has no regional wall motion abnormalities. Left ventricular diastolic  parameters were normal. The average left ventricular global longitudinal strain is -21.2 %. The global longitudinal strain is normal.  2. Right ventricular systolic function is normal. The right ventricular size is normal. There is normal pulmonary artery systolic pressure.  3. The mitral valve is normal in structure. No evidence of mitral valve regurgitation. No evidence of mitral stenosis.  4. The aortic valve has an indeterminant number of cusps. Aortic valve regurgitation is not visualized. No aortic stenosis is present.  5. The inferior vena cava is normal in size with greater than 50% respiratory variability, suggesting right atrial pressure of 3 mmHg. Comparison(s): No significant change from prior study. FINDINGS  Left Ventricle: Left ventricular ejection fraction, by estimation, is 55 to 60%. Left ventricular ejection fraction by 3D volume is 62 %. The left ventricle has normal function. The left ventricle has no regional wall motion abnormalities. The average left ventricular global longitudinal strain is -21.2 %. Strain was performed and the global longitudinal strain is normal. The left ventricular internal cavity size was normal in size. There is no left ventricular hypertrophy. Left ventricular diastolic parameters were normal. Right Ventricle: The right ventricular size is normal. No increase in right ventricular wall thickness. Right ventricular systolic function is normal. There is normal pulmonary artery systolic pressure. The  tricuspid regurgitant velocity is 2.44 m/s, and  with an assumed right atrial pressure of 3 mmHg, the estimated right ventricular systolic pressure is 26.8 mmHg. Left Atrium: Left atrial size was normal in size. Right Atrium: Right atrial size was normal in  size. Pericardium: There is no evidence of pericardial effusion. Mitral Valve: The mitral valve is normal in structure. No evidence of mitral valve regurgitation. No evidence of mitral valve stenosis. Tricuspid Valve: The tricuspid valve is normal in structure. Tricuspid valve regurgitation is trivial. No evidence of tricuspid stenosis. Aortic Valve: The aortic valve has an indeterminant number of cusps. Aortic valve regurgitation is not visualized. No aortic stenosis is present. Pulmonic Valve: The pulmonic valve was grossly normal. Pulmonic valve regurgitation is trivial. No evidence of pulmonic stenosis. Aorta: The aortic root and ascending aorta are structurally normal, with no evidence of dilitation. Venous: The inferior vena cava is normal in size with greater than 50% respiratory variability, suggesting right atrial pressure of 3 mmHg. IAS/Shunts: No atrial level shunt detected by color flow Doppler. Additional Comments: 3D was performed not requiring image post processing on an independent workstation and was normal.  LEFT VENTRICLE PLAX 2D LVIDd:         4.70 cm         Diastology LVIDs:         3.40 cm         LV e' medial:    9.25 cm/s LV PW:         0.90 cm         LV E/e' medial:  9.6 LV IVS:        1.00 cm         LV e' lateral:   11.70 cm/s LVOT diam:     1.90 cm         LV E/e' lateral: 7.6 LVOT Area:     2.84 cm                                2D Longitudinal                                Strain                                2D Strain GLS   -21.2 %                                Avg:                                 3D Volume EF                                LV 3D EF:    Left                                             ventricul                                              ar  ejection                                             fraction                                             by 3D                                             volume is                                             62 %.                                LV 3D EDV:   93.12 ml                                LV 3D ESV:   35.80 ml                                 3D Volume EF:                                3D EF:        62 % RIGHT VENTRICLE             IVC RV Basal diam:  3.10 cm     IVC diam: 1.80 cm RV S prime:     12.20 cm/s TAPSE (M-mode): 2.4 cm LEFT ATRIUM             Index LA diam:        4.30 cm 1.99 cm/m LA Vol (A2C):   65.1 ml 30.06 ml/m LA Vol (A4C):   40.8 ml 18.84 ml/m LA Biplane Vol: 53.3 ml 24.61 ml/m   AORTA Ao Root diam: 2.40 cm Ao Asc diam:  2.40 cm MITRAL VALVE               TRICUSPID VALVE MV Area (PHT): 5.42 cm    TR Peak grad:   23.8 mmHg MV Decel Time: 140 msec    TR Vmax:        244.00 cm/s MV E velocity: 88.60 cm/s MV A velocity: 85.00 cm/s  SHUNTS MV E/A ratio:  1.04        Systemic Diam: 1.90 cm Vishnu Priya Mallipeddi Electronically signed by Diannah Late Mallipeddi Signature Date/Time: 11/09/2023/2:42:46 PM    Final     ASSESSMENT & PLAN Diamond Marshall is a 53 y.o. female who presents for a [facility] visit for evaluation of fever.   #Fever #Chills --Vitals were stable except for temperature of 101 F which decreased to 99 F  -- Labs from today were reviewed that showed no leukocytosis with WBC 4.7,  ANC 3.7.  Respiratory panel was negative for COVID, flu and RSV.  Blood cultures are pending.  Urine is pending. -- As patient's fevers have been cyclical with her treatments, suspect posttreatment response.  Low concern for infectious process at this time. -- Okay to take Tylenol  to control fevers at this time. --Patient will receive 1 L of IV fluids today.  Encouraged hydration drinking 1-2 L per day.  -- Strict  precautions if patient develops a fever of 100.6 F or higher with other infectious symptoms to present to the emergency room.  #Microcytic anemia: -- Patient does have pernicious anemia, currently on B12 injections. -- Most recent vitamin B12 level was normal measuring 408. -- Due to microcytosis, we will evaluate for iron deficiency.  Iron panel is pending.  If there is confirmation for iron deficiency, we will need to consider GI evaluation as patient denies any signs of bleeding including menstrual cycles.  Orders Placed This Encounter  Procedures   Urine Culture    Standing Status:   Future    Expiration Date:   12/06/2024   Urinalysis, Complete w Microscopic    Standing Status:   Future    Expiration Date:   12/06/2024    All questions were answered. The patient knows to call the clinic with any problems, questions or concerns.  I have spent a total of 30 minutes minutes of face-to-face and non-face-to-face time, preparing to see the patient,  performing a medically appropriate examination, counseling and educating the patient, ordering medications/tests/procedures,documenting clinical information in the electronic health record, independently interpreting results and communicating results to the patient, and care coordination.   Johnston Police, PA-C Department of Hematology/Oncology [facility] Phone: [phone]

## 2023-12-07 NOTE — Patient Instructions (Signed)

## 2023-12-10 ENCOUNTER — Ambulatory Visit: Payer: Self-pay

## 2023-12-10 ENCOUNTER — Ambulatory Visit: Payer: Self-pay | Admitting: Physician Assistant

## 2023-12-10 NOTE — Telephone Encounter (Signed)
-----   Message from Johnston ONEIDA Police sent at 12/10/2023 10:57 AM EDT ----- Please call and check in on patient. Notify her that blood culture are negative so far.   ----- Message ----- From: Interface, Lab In [place] Sent: 12/07/2023   3:50 PM EDT To: Johnston ONEIDA Police, PA-C

## 2023-12-10 NOTE — Telephone Encounter (Signed)
 Spoke with pt and she is better today with no more fevers. Advised her of the cultures but her only concern was the elevated AST.   Her potassium was low but she said she forgets to take her meds and she will do better.

## 2023-12-12 LAB — CULTURE, BLOOD (SINGLE)
Culture: NO GROWTH
Culture: NO GROWTH

## 2023-12-17 ENCOUNTER — Encounter: Payer: Self-pay | Admitting: Hematology and Oncology

## 2023-12-18 ENCOUNTER — Other Ambulatory Visit: Payer: Self-pay | Admitting: Hematology and Oncology

## 2023-12-18 DIAGNOSIS — E876 Hypokalemia: Secondary | ICD-10-CM

## 2023-12-19 ENCOUNTER — Encounter: Payer: Self-pay | Admitting: Hematology and Oncology

## 2023-12-19 ENCOUNTER — Other Ambulatory Visit: Payer: Self-pay | Admitting: Hematology and Oncology

## 2023-12-24 ENCOUNTER — Telehealth: Payer: Self-pay

## 2023-12-24 NOTE — Telephone Encounter (Signed)
 Pt verbally confirmed appt for 8/5

## 2023-12-25 ENCOUNTER — Inpatient Hospital Stay (HOSPITAL_BASED_OUTPATIENT_CLINIC_OR_DEPARTMENT_OTHER): Admitting: Hematology and Oncology

## 2023-12-25 ENCOUNTER — Inpatient Hospital Stay

## 2023-12-25 ENCOUNTER — Encounter: Payer: Self-pay | Admitting: Hematology and Oncology

## 2023-12-25 ENCOUNTER — Inpatient Hospital Stay: Attending: Hematology and Oncology

## 2023-12-25 VITALS — BP 128/53 | HR 88 | Temp 98.2°F | Resp 18 | Wt 273.8 lb

## 2023-12-25 DIAGNOSIS — Z8041 Family history of malignant neoplasm of ovary: Secondary | ICD-10-CM | POA: Diagnosis not present

## 2023-12-25 DIAGNOSIS — Z1721 Progesterone receptor positive status: Secondary | ICD-10-CM | POA: Insufficient documentation

## 2023-12-25 DIAGNOSIS — Z79899 Other long term (current) drug therapy: Secondary | ICD-10-CM | POA: Diagnosis not present

## 2023-12-25 DIAGNOSIS — C50212 Malignant neoplasm of upper-inner quadrant of left female breast: Secondary | ICD-10-CM

## 2023-12-25 DIAGNOSIS — Z803 Family history of malignant neoplasm of breast: Secondary | ICD-10-CM | POA: Diagnosis not present

## 2023-12-25 DIAGNOSIS — Z95828 Presence of other vascular implants and grafts: Secondary | ICD-10-CM

## 2023-12-25 DIAGNOSIS — R040 Epistaxis: Secondary | ICD-10-CM | POA: Diagnosis not present

## 2023-12-25 DIAGNOSIS — Z1731 Human epidermal growth factor receptor 2 positive status: Secondary | ICD-10-CM | POA: Diagnosis not present

## 2023-12-25 DIAGNOSIS — Z170 Estrogen receptor positive status [ER+]: Secondary | ICD-10-CM | POA: Insufficient documentation

## 2023-12-25 DIAGNOSIS — Z5112 Encounter for antineoplastic immunotherapy: Secondary | ICD-10-CM | POA: Insufficient documentation

## 2023-12-25 DIAGNOSIS — G629 Polyneuropathy, unspecified: Secondary | ICD-10-CM | POA: Diagnosis not present

## 2023-12-25 DIAGNOSIS — R509 Fever, unspecified: Secondary | ICD-10-CM

## 2023-12-25 LAB — URINALYSIS, COMPLETE (UACMP) WITH MICROSCOPIC
Bilirubin Urine: NEGATIVE
Glucose, UA: NEGATIVE mg/dL
Hgb urine dipstick: NEGATIVE
Ketones, ur: NEGATIVE mg/dL
Nitrite: NEGATIVE
Protein, ur: NEGATIVE mg/dL
Specific Gravity, Urine: 1.013 (ref 1.005–1.030)
pH: 6 (ref 5.0–8.0)

## 2023-12-25 LAB — CMP (CANCER CENTER ONLY)
ALT: 17 U/L (ref 0–44)
AST: 34 U/L (ref 15–41)
Albumin: 3.5 g/dL (ref 3.5–5.0)
Alkaline Phosphatase: 128 U/L — ABNORMAL HIGH (ref 38–126)
Anion gap: 5 (ref 5–15)
BUN: 9 mg/dL (ref 6–20)
CO2: 28 mmol/L (ref 22–32)
Calcium: 8.8 mg/dL — ABNORMAL LOW (ref 8.9–10.3)
Chloride: 105 mmol/L (ref 98–111)
Creatinine: 0.58 mg/dL (ref 0.44–1.00)
GFR, Estimated: >60 mL/min (ref >60)
Glucose, Bld: 102 mg/dL — ABNORMAL HIGH (ref 70–99)
Potassium: 3.6 mmol/L (ref 3.5–5.1)
Sodium: 138 mmol/L (ref 135–145)
Total Bilirubin: 0.8 mg/dL (ref 0.0–1.2)
Total Protein: 7.3 g/dL (ref 6.5–8.1)

## 2023-12-25 LAB — CBC WITH DIFFERENTIAL (CANCER CENTER ONLY)
Abs Immature Granulocytes: 0.04 K/uL (ref 0.00–0.07)
Basophils Absolute: 0.1 K/uL (ref 0.0–0.1)
Basophils Relative: 1 %
Eosinophils Absolute: 0.2 K/uL (ref 0.0–0.5)
Eosinophils Relative: 3 %
HCT: 27.3 % — ABNORMAL LOW (ref 36.0–46.0)
Hemoglobin: 8.3 g/dL — ABNORMAL LOW (ref 12.0–15.0)
Immature Granulocytes: 1 %
Lymphocytes Relative: 29 %
Lymphs Abs: 2.1 K/uL (ref 0.7–4.0)
MCH: 22.2 pg — ABNORMAL LOW (ref 26.0–34.0)
MCHC: 30.4 g/dL (ref 30.0–36.0)
MCV: 73 fL — ABNORMAL LOW (ref 80.0–100.0)
Monocytes Absolute: 0.4 K/uL (ref 0.1–1.0)
Monocytes Relative: 6 %
Neutro Abs: 4.4 K/uL (ref 1.7–7.7)
Neutrophils Relative %: 60 %
Platelet Count: 206 K/uL (ref 150–400)
RBC: 3.74 MIL/uL — ABNORMAL LOW (ref 3.87–5.11)
RDW: 20.2 % — ABNORMAL HIGH (ref 11.5–15.5)
WBC Count: 7.2 K/uL (ref 4.0–10.5)
nRBC: 0 % (ref 0.0–0.2)

## 2023-12-25 MED ORDER — PROCHLORPERAZINE MALEATE 10 MG PO TABS
10.0000 mg | ORAL_TABLET | Freq: Once | ORAL | Status: AC
  Administered 2023-12-25: 10 mg via ORAL
  Filled 2023-12-25: qty 1

## 2023-12-25 MED ORDER — SODIUM CHLORIDE 0.9 % IV SOLN: INTRAVENOUS | Status: DC

## 2023-12-25 MED ORDER — ACETAMINOPHEN 325 MG PO TABS
650.0000 mg | ORAL_TABLET | Freq: Once | ORAL | Status: AC
  Administered 2023-12-25: 650 mg via ORAL
  Filled 2023-12-25: qty 2

## 2023-12-25 MED ORDER — LORATADINE 10 MG PO TABS
10.0000 mg | ORAL_TABLET | Freq: Once | ORAL | Status: AC
  Administered 2023-12-25: 10 mg via ORAL
  Filled 2023-12-25: qty 1

## 2023-12-25 MED ORDER — SODIUM CHLORIDE 0.9% FLUSH
10.0000 mL | INTRAVENOUS | Status: DC | PRN
Start: 2023-12-25 — End: 2023-12-25

## 2023-12-25 MED ORDER — SODIUM CHLORIDE 0.9% FLUSH
10.0000 mL | Freq: Once | INTRAVENOUS | Status: AC
Start: 2023-12-25 — End: 2023-12-25
  Administered 2023-12-25: 10 mL

## 2023-12-25 MED ORDER — SODIUM CHLORIDE 0.9 % IV SOLN
3.0000 mg/kg | Freq: Once | INTRAVENOUS | Status: AC
  Administered 2023-12-25: 400 mg via INTRAVENOUS
  Filled 2023-12-25: qty 20

## 2023-12-25 NOTE — Progress Notes (Signed)
 [facility] [phone]   [phone]  PROGRESS NOTE  Patient Care Team: Zollie Lowers, MD as PCP - General (Family Medicine) [name], MD as Consulting Physician (General Surgery) [name], MD as Consulting Physician (Hematology and Oncology) Izell Domino, MD as Attending Physician (Radiation Oncology)   CHIEF COMPLAINTS/PURPOSE OF CONSULTATION:  Fever and chills  Oncology History  Malignant neoplasm of upper-inner quadrant of left breast in female, estrogen receptor positive (HCC)  06/06/2022 Mammogram   Diagnostic mammogram given palpable mass showed suspicious spiculated left breast mass with surrounding nodularity at 9 o'clock position.  This measures up to 3.1 cm sonographically however due to deep location and patient body habitus the mammographic measurement of 5.8 cm is felt to be more accurate.  At least 2 abnormal lymph nodes in the left axilla.  Possible abnormal palpable left supraclavicular lymph node.   06/06/2022 Breast US    Breast ultrasound confirmed these findings.   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  Prognostic showed ER 65% weak to strong staining, PR 15% weak to moderate staining, Ki-67 of 50% and HER2 positive by IHC at 3+    Genetic Testing   [facility] Custom Panel+RNA was Negative. Report date is 06/28/2022.   The Custom Hereditary Cancers Panel offered by [facility] includes sequencing and/or deletion duplication testing of the following 43 genes: APC, ATM, AXIN2, BAP1, BARD1, BMPR1A, BRCA1, BRCA2, BRIP1, CDH1, CDK4, CDKN2A (p14ARF and p16INK4a only), CHEK2, CTNNA1, EPCAM (Deletion/duplication testing only), FH, GREM1 (promoter region duplication testing only), HOXB13, KIT, MBD4, MEN1, MLH1, MSH2, MSH3, MSH6, MUTYH, NF1, NHTL1, PALB2, PDGFRA,  PMS2, POLD1, POLE, PTEN, RAD51C, RAD51D, SMAD4, SMARCA4. STK11, TP53, TSC1, TSC2, and VHL.    06/21/2022 Cancer Staging   Staging form: Breast, AJCC 8th Edition - Clinical stage from 06/21/2022: Stage IV (cT3, cN3, cM1, G3, ER+, PR+, HER2+) - Signed by [name], MD on 09/17/2023 Stage prefix: Initial diagnosis Histologic grading system: 3 grade system Stage used in treatment planning: Yes National guidelines used in treatment planning: Yes Type of national guideline used in treatment planning: NCCN   06/29/2022 PET scan   IMPRESSION: 1. Hypermetabolic left breast mass, compatible with primary breast malignancy. 2. Hypermetabolic left cervical, axillary, subpectoral, supraclavicular and prevascular mediastinal lymph nodes, compatible with nodal metastatic disease. 3. No evidence of metastatic disease in the abdomen or pelvis. 4. Aortic Atherosclerosis (ICD10-I70.0).     Electronically Signed   By: [name] M.D.   On: 06/29/2022 09:32   07/11/2022 - 01/16/2023 Chemotherapy   Patient is on Treatment Plan : BREAST DOCEtaxel  + Trastuzumab  + Pertuzumab  (THP) q21d x 8 cycles / Trastuzumab  + Pertuzumab  q21d x 4 cycles     02/01/2023 - 06/29/2023 Chemotherapy   Patient is on Treatment Plan : BREAST METASTATIC Fam-Trastuzumab Deruxtecan-nxki  (Enhertu ) (5.4) q21d     10/02/2023 -  Chemotherapy   Patient is on Treatment Plan : BREAST ADO-Trastuzumab Emtansine  (Kadcyla ) q21d        HISTORY OF PRESENTING ILLNESS:  Diamond Marshall 53 y.o. female who is under the care of Dr. Loretha for breast cancer. She is currently receiving Kadcyla  every 21 days.  Discussed the use of AI scribe software for clinical note transcription with the patient, who gave verbal consent to proceed.  History of Present Illness Diamond Marshall is a 53 year  old female with breast cancer who presents with fevers, neuropathy, and nosebleeds.  She experiences recurrent fevers lasting two to three days after each  kadcyla , with varying intensity, sometimes severe enough to require hospitalization. A previous episode necessitated her being brought in a wheelchair due to fever and fatigue.  She is currently taking gabapentin , 600 mg three times a day, for neuropathy. While there has been some improvement, the neuropathy remains significant, particularly affecting her ability to type as her pinky fingers are not functioning as expected. The neuropathy was previously unbearable but is now more manageable.  She has started experiencing epistaxis, which she attributes to a head cold causing nasal dryness. She describes waking up with her pillow covered in blood spots and reports prolonged bleeding from minor cuts, such as when she cut her toenail too close.  In terms of social history, she lives with a school teacher, which she believes contributes to her frequent colds.   MEDICAL HISTORY:  Past Medical History:  Diagnosis Date   Abdominal hernia    Anemia    b12 deficiency   Anxiety    Arthritis    B12 deficiency    Back pain    Cancer (HCC)    breast cancer   Depression    Family history of adverse reaction to anesthesia    mother vomits after anesthesia   Gallbladder problem    GERD (gastroesophageal reflux disease)    History of hiatal hernia    small per patient   Hypertension    Hypothyroidism    Joint pain    Knee pain    Palpitation    when iron is low   Pernicious anemia    SOB (shortness of breath)    Thyroid  disease    hypothyroidism   Vitamin D  deficiency     SURGICAL HISTORY: Past Surgical History:  Procedure Laterality Date   BREAST BIOPSY Left 06/09/2022   US  LT BREAST BX W LOC DEV 1ST LESION IMG BX SPEC US  GUIDE 06/09/2022 [facility] MAMMOGRAPHY   CESAREAN SECTION  1999   Dilatation and currettagement     for miscarriage 18 years ago   PORTACATH PLACEMENT Right 07/05/2022   Procedure: INSERTION PORT-A-CATH;  Surgeon: [name], MD;  Location: [facility] OR;  Service:  General;  Laterality: Right;   SIMPLE MASTECTOMY WITH AXILLARY SENTINEL NODE BIOPSY Bilateral 07/26/2023   Procedure: LEFT SKIN REDUCING MASTECTOMY, RIGHT SKIN SPARING, RISK REDUCING MASTECTOMY;  Surgeon: [name], MD;  Location: [facility] OR;  Service: General;  Laterality: Bilateral;  PEC BLOCK    SOCIAL HISTORY: Social History   Socioeconomic History   Marital status: Married    Spouse name: Garrel   Number of children: Not on file   Years of education: Not on file   Highest education level: Not on file  Occupational History   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  Tobacco Use   Smoking status: Never   Smokeless tobacco: Never  Vaping Use   Vaping status: Never Used  Substance and Sexual Activity   Alcohol use: No   Drug use: No   Sexual activity: Yes    Birth control/protection: Pill  Other Topics Concern   Not on file  Social History Narrative   Not on file   Social Drivers of Health   Financial Resource Strain: Not on file  Food Insecurity: No Food Insecurity (10/09/2023)   Hunger Vital Sign    Worried About Running Out of Food in the Last Year: Never true  Ran Out of Food in the Last Year: Never true  Transportation Needs: No Transportation Needs (10/09/2023)   PRAPARE - Administrator, Civil Service (Medical): No    Lack of Transportation (Non-Medical): No  Physical Activity: Not on file  Stress: Not on file  Social Connections: Moderately Integrated (07/26/2023)   Social Connection and Isolation Panel    Frequency of Communication with Friends and Family: Three times a week    Frequency of Social Gatherings with Friends and Family: Three times a week    Attends Religious Services: More than 4 times per year    Active Member of Clubs or Organizations: No    Attends Banker Meetings: Never    Marital Status: Married  Catering manager Violence: Not At Risk (10/09/2023)   Humiliation, Afraid, Rape, and Kick questionnaire    Fear of Current  or Ex-Partner: No    Emotionally Abused: No    Physically Abused: No    Sexually Abused: No    FAMILY HISTORY: Family History  Problem Relation Age of Onset   Hypertension Mother    Diabetes Mother    High Cholesterol Mother    Thyroid  disease Mother    Depression Mother    Anxiety disorder Mother    Obesity Mother    CVA Father        hemorrhagic   Ovarian cancer Maternal Aunt 64   Colon cancer Maternal Aunt    Breast cancer Cousin 40       maternal first cousin, reports negative genetic testing    ALLERGIES:  is allergic to phentermine , bee venom, lactose intolerance (gi), peanut allergen powder-dnfp, topiramate , sulfa antibiotics, tape, and wound dressing adhesive.  MEDICATIONS:  Current Outpatient Medications  Medication Sig Dispense Refill   acetaminophen  (TYLENOL ) 325 MG tablet Take 2 tablets (650 mg total) by mouth every 6 (six) hours as needed for mild pain (pain score 1-3) (or Fever >/= 101). 60 tablet 0   carvedilol  (COREG ) 3.125 MG tablet TAKE ONE TABLET TWICE DAILY WITH MEAL(S) 60 tablet 0   cyanocobalamin  (VITAMIN B12) 1000 MCG/ML injection INJECT 1 ML INTRAMUSCULARLY EVERY 15 DAYS (Patient taking differently: Inject 1,000 mcg into the skin every 30 (thirty) days.) 30 mL 3   cycloSPORINE  (RESTASIS ) 0.05 % ophthalmic emulsion Place 1 drop into both eyes 2 (two) times daily. 5.5 mL 1   EPINEPHrine  0.3 mg/0.3 mL IJ SOAJ injection Inject 0.3 mg into the muscle as needed for anaphylaxis. 2 each 0   ergocalciferol  (VITAMIN D2) 1.25 MG (50000 UT) capsule Take 1 capsule (50,000 Units total) by mouth once a week. 12 capsule 1   escitalopram  (LEXAPRO ) 20 MG tablet Take 1 tablet (20 mg total) by mouth daily. Please take citalopram 10 mg( half pill) only for next 3 days starting from tomorrow.  Then stop it until you finish the course of linezolid .  Can resume previous dose after  2 days of finishing linezolid      gabapentin  (NEURONTIN ) 300 MG capsule Take 1 capsule (300 mg  total) by mouth 3 (three) times daily. 60 capsule 6   LORazepam  (ATIVAN ) 0.5 MG tablet Take 1 tablet (0.5 mg total) by mouth every 8 (eight) hours as needed for anxiety. 30 tablet 1   oxyCODONE  (OXY IR/ROXICODONE ) 5 MG immediate release tablet Take 1 tablet (5 mg total) by mouth every 6 (six) hours as needed for severe pain (pain score 7-10). 15 tablet 0   pantoprazole  (PROTONIX ) 40 MG tablet Take 1  tablet (40 mg total) by mouth daily. (Patient taking differently: Take 40 mg by mouth See admin instructions. Take 40 mg by mouth in the morning 30 minutes before breakfast and an additional 40 mg in the evening as needed for unresolved acid reflux symptoms) 90 tablet 3   potassium chloride  (KLOR-CON ) 10 MEQ tablet TAKE TWO TABLETS DAILY AS DIRECTED 60 tablet 0   No current facility-administered medications for this visit.   Facility-Administered Medications Ordered in Other Visits  Medication Dose Route Frequency Provider Last Rate Last Admin   0.9 %  sodium chloride  infusion   Intravenous Continuous Arienna Benegas, MD 10 mL/hr at 12/25/23 1159 New Bag at 12/25/23 1159   sodium chloride  flush (NS) 0.9 % injection 10 mL  10 mL Intracatheter PRN Caresse Sedivy, MD        REVIEW OF SYSTEMS:   Constitutional: (+ ) fevers, ( + )  chills , ( - ) night sweats Eyes: ( - ) blurriness of vision, ( - ) double vision, ( - ) watery eyes Ears, nose, mouth, throat, and face: ( - ) mucositis, ( - ) sore throat Respiratory: ( - ) cough, ( - ) dyspnea, ( - ) wheezes Cardiovascular: ( - ) palpitation, ( - ) chest discomfort, ( - ) lower extremity swelling Gastrointestinal:  ( + ) nausea, ( - ) heartburn, ( - ) change in bowel habits Skin: ( - ) abnormal skin rashes Lymphatics: ( - ) new lymphadenopathy, ( - ) easy bruising Neurological: ( - ) numbness, ( - ) tingling, ( - ) new weaknesses Behavioral/Psych: ( - ) mood change, ( - ) new changes  All other systems were reviewed with the patient and are  negative.  PHYSICAL EXAMINATION: ECOG PERFORMANCE STATUS: 1 - Symptomatic but completely ambulatory  Vitals:   12/25/23 1118 12/25/23 1119  BP: (!) 151/64 (!) 128/53  Pulse: 88   Resp: 18   Temp: 98.2 F (36.8 C)   SpO2: 100%    Filed Weights   12/25/23 1118  Weight: 273 lb 12.8 oz (124.2 kg)    GENERAL: well appearing female in NAD  SKIN: skin color, texture, turgor are normal, no rashes or significant lesions EYES: conjunctiva are pink and non-injected, sclera clear OROPHARYNX: no exudate, no erythema; lips, buccal mucosa, and tongue normal  NECK: supple, non-tender LUNGS: clear to auscultation and percussion with normal breathing effort HEART: regular rate & rhythm and no murmurs and no lower extremity edema Musculoskeletal: no cyanosis of digits and no clubbing  PSYCH: alert & oriented x 3, fluent speech NEURO: no focal motor/sensory deficits  LABORATORY DATA:  I have reviewed the data as listed    Latest Ref Rng & Units 12/25/2023   10:54 AM 12/07/2023    1:36 PM 12/04/2023   10:00 AM  CBC  WBC 4.0 - 10.5 K/uL 7.2  4.7  6.6   Hemoglobin 12.0 - 15.0 g/dL 8.3  9.1  8.7   Hematocrit 36.0 - 46.0 % 27.3  29.8  28.7   Platelets 150 - 400 K/uL 206  198  225        Latest Ref Rng & Units 12/25/2023   10:54 AM 12/07/2023    1:36 PM 12/04/2023   10:00 AM  CMP  Glucose 70 - 99 mg/dL 897  87  879   BUN 6 - 20 mg/dL 9  9  11    Creatinine 0.44 - 1.00 mg/dL 9.41  9.25  9.35   Sodium  135 - 145 mmol/L 138  137  140   Potassium 3.5 - 5.1 mmol/L 3.6  3.3  3.9   Chloride 98 - 111 mmol/L 105  102  106   CO2 22 - 32 mmol/L 28  28  28    Calcium 8.9 - 10.3 mg/dL 8.8  9.2  9.1   Total Protein 6.5 - 8.1 g/dL 7.3  8.0  7.4   Total Bilirubin 0.0 - 1.2 mg/dL 0.8  0.8  0.7   Alkaline Phos 38 - 126 U/L 128  136  121   AST 15 - 41 U/L 34  60  33   ALT 0 - 44 U/L 17  21  16     RADIOGRAPHIC STUDIES: I have personally reviewed the radiological images as listed and agreed with the findings  in the report. No results found.   ASSESSMENT & PLAN Diamond Marshall is a 53 y.o. female who presents for a [facility] visit for evaluation of fever.   Assessment and Plan Assessment & Plan HER2-positive breast cancer, on kadcyla . No active disease on imaging.  - Continue current treatment with a slightly lower dose of kadcyla . - she was wondering if she can do oral her 2 therapy - Can consider neratinib and herceptin  for next line if she wants oral therapy   Chemotherapy-induced peripheral neuropathy Peripheral neuropathy affecting finger function. Limited response to high-dose gabapentin . Discussed potential switch to pregabalin pending insurance authorization. Neuropathy may improve but not fully reversible. - Consider switching to pregabalin if insurance authorizes. - Use cold mittens and socks. - Consider PEA supplements.  Chemotherapy-related fever Intermittent fevers with chemotherapy cycles. Previous episodes required hospitalization. Monitoring advised with potential dose reduction. Investigate staph infection if fever recurs. - Monitor fever. - Contact provider if fever becomes severe.  Chemotherapy-related epistaxis and easy bleeding Epistaxis and easy bleeding possibly due to dry nasal passages   Iron deficiency anemia Hemoglobin at 8.3? iron deficiency anemia. Discussed oral iron supplementation with potential for IV iron if oral is insufficient and insurance authorizes. - Start oral iron supplementation. - Order iron panel if extra blood is available. - Consider IV iron if oral supplementation is insufficient and insurance authorizes.  Acute upper respiratory tract infection (head cold) Current head cold with nasal dryness and epistaxis, likely due to increased exposure to infections.    Orders Placed This Encounter  Procedures   CT CHEST ABDOMEN PELVIS W CONTRAST    Standing Status:   Future    Expected Date:   01/08/2024    Expiration Date:   12/24/2024    If  indicated for the ordered procedure, I authorize the administration of contrast media per Radiology protocol:   Yes    Does the patient have a contrast media/X-ray dye allergy?:   No    Preferred imaging location?:   [facility]    If indicated for the ordered procedure, I authorize the administration of oral contrast media per Radiology protocol:   No    Reason for no oral contrast::   medical necessity    All questions were answered. The patient knows to call the clinic with any problems, questions or concerns.  I have spent a total of 30 minutes minutes of face-to-face and non-face-to-face time, preparing to see the patient,  performing a medically appropriate examination, counseling and educating the patient, ordering medications/tests/procedures,documenting clinical information in the electronic health record, independently interpreting results and communicating results to the patient, and care coordination.   Johnston Police,  PA-C Department of Hematology/Oncology [facility] Phone: [phone]

## 2023-12-26 LAB — URINE CULTURE: Culture: <10000 — AB

## 2023-12-28 ENCOUNTER — Emergency Department (HOSPITAL_COMMUNITY)

## 2023-12-28 ENCOUNTER — Telehealth: Payer: Self-pay | Admitting: *Deleted

## 2023-12-28 ENCOUNTER — Emergency Department (HOSPITAL_COMMUNITY): Admission: EM | Admit: 2023-12-28 | Discharge: 2023-12-28 | Disposition: A | Discharge status: Home / Self Care

## 2023-12-28 DIAGNOSIS — R0981 Nasal congestion: Secondary | ICD-10-CM | POA: Diagnosis present

## 2023-12-28 DIAGNOSIS — Z853 Personal history of malignant neoplasm of breast: Secondary | ICD-10-CM | POA: Diagnosis not present

## 2023-12-28 DIAGNOSIS — J0100 Acute maxillary sinusitis, unspecified: Secondary | ICD-10-CM | POA: Insufficient documentation

## 2023-12-28 DIAGNOSIS — Z91010 Allergy to peanuts: Secondary | ICD-10-CM | POA: Diagnosis not present

## 2023-12-28 LAB — URINALYSIS, W/ REFLEX TO CULTURE (INFECTION SUSPECTED)
Bilirubin Urine: NEGATIVE
Glucose, UA: NEGATIVE mg/dL
Hgb urine dipstick: NEGATIVE
Ketones, ur: NEGATIVE mg/dL
Nitrite: NEGATIVE
Protein, ur: NEGATIVE mg/dL
Specific Gravity, Urine: 1.016 (ref 1.005–1.030)
pH: 5 (ref 5.0–8.0)

## 2023-12-28 LAB — COMPREHENSIVE METABOLIC PANEL WITH GFR
ALT: 22 U/L (ref 0–44)
AST: 63 U/L — ABNORMAL HIGH (ref 15–41)
Albumin: 3.1 g/dL — ABNORMAL LOW (ref 3.5–5.0)
Alkaline Phosphatase: 134 U/L — ABNORMAL HIGH (ref 38–126)
Anion gap: 9 (ref 5–15)
BUN: 11 mg/dL (ref 6–20)
CO2: 24 mmol/L (ref 22–32)
Calcium: 8.6 mg/dL — ABNORMAL LOW (ref 8.9–10.3)
Chloride: 101 mmol/L (ref 98–111)
Creatinine, Ser: 0.73 mg/dL (ref 0.44–1.00)
GFR, Estimated: >60 mL/min (ref >60)
Glucose, Bld: 110 mg/dL — ABNORMAL HIGH (ref 70–99)
Potassium: 3.4 mmol/L — ABNORMAL LOW (ref 3.5–5.1)
Sodium: 134 mmol/L — ABNORMAL LOW (ref 135–145)
Total Bilirubin: 0.7 mg/dL (ref 0.0–1.2)
Total Protein: 7.7 g/dL (ref 6.5–8.1)

## 2023-12-28 LAB — CBC WITH DIFFERENTIAL/PLATELET
Abs Immature Granulocytes: 0.04 K/uL (ref 0.00–0.07)
Basophils Absolute: 0 K/uL (ref 0.0–0.1)
Basophils Relative: 1 %
Eosinophils Absolute: 0.1 K/uL (ref 0.0–0.5)
Eosinophils Relative: 2 %
HCT: 28.5 % — ABNORMAL LOW (ref 36.0–46.0)
Hemoglobin: 8.6 g/dL — ABNORMAL LOW (ref 12.0–15.0)
Immature Granulocytes: 1 %
Lymphocytes Relative: 14 %
Lymphs Abs: 0.7 K/uL (ref 0.7–4.0)
MCH: 22.6 pg — ABNORMAL LOW (ref 26.0–34.0)
MCHC: 30.2 g/dL (ref 30.0–36.0)
MCV: 75 fL — ABNORMAL LOW (ref 80.0–100.0)
Monocytes Absolute: 0.4 K/uL (ref 0.1–1.0)
Monocytes Relative: 7 %
Neutro Abs: 3.8 K/uL (ref 1.7–7.7)
Neutrophils Relative %: 75 %
Platelets: 177 K/uL (ref 150–400)
RBC: 3.8 MIL/uL — ABNORMAL LOW (ref 3.87–5.11)
RDW: 20.6 % — ABNORMAL HIGH (ref 11.5–15.5)
WBC: 5 K/uL (ref 4.0–10.5)
nRBC: 0 % (ref 0.0–0.2)

## 2023-12-28 LAB — RESP PANEL BY RT-PCR (RSV, FLU A&B, COVID)  RVPGX2
Influenza A by PCR: NEGATIVE
Influenza B by PCR: NEGATIVE
Resp Syncytial Virus by PCR: NEGATIVE
SARS Coronavirus 2 by RT PCR: NEGATIVE

## 2023-12-28 LAB — I-STAT CG4 LACTIC ACID, ED
Lactic Acid, Venous: 1.1 mmol/L (ref 0.5–1.9)
Lactic Acid, Venous: 0.7 mmol/L (ref 0.5–1.9)

## 2023-12-28 MED ORDER — HEPARIN SOD (PORK) LOCK FLUSH 100 UNIT/ML IV SOLN
INTRAVENOUS | Status: AC
  Administered 2023-12-28: 500 [IU]
  Filled 2023-12-28: qty 5

## 2023-12-28 MED ORDER — AMOXICILLIN-POT CLAVULANATE 875-125 MG PO TABS: 1.0000 | ORAL_TABLET | Freq: Two times a day (BID) | ORAL | 0 refills | Status: AC

## 2023-12-28 MED ORDER — LACTATED RINGERS IV BOLUS
1000.0000 mL | Freq: Once | INTRAVENOUS | Status: AC
  Administered 2023-12-28: 1000 mL via INTRAVENOUS

## 2023-12-28 MED ORDER — SODIUM CHLORIDE 0.9 % IV SOLN: 1.0000 g | Freq: Once | INTRAVENOUS | Status: DC

## 2023-12-28 MED ORDER — SODIUM CHLORIDE 0.9 % IV SOLN
2.0000 g | Freq: Once | INTRAVENOUS | Status: AC
  Administered 2023-12-28: 2 g via INTRAVENOUS
  Filled 2023-12-28: qty 20

## 2023-12-28 MED ORDER — ACETAMINOPHEN 325 MG PO TABS
650.0000 mg | ORAL_TABLET | Freq: Once | ORAL | Status: AC
  Administered 2023-12-28: 650 mg via ORAL
  Filled 2023-12-28: qty 2

## 2023-12-28 NOTE — Discharge Instructions (Signed)
 Return if you develop persistent fevers, lightheadedness, passout, chest pain, shortness of breath, abdominal pain, severe headache, uncontrolled nausea vomiting or any new or worsening symptoms that are concerning to you.  Please follow-up with your primary doctor soon as possible

## 2023-12-28 NOTE — ED Provider Notes (Signed)
 [place] EMERGENCY DEPARTMENT AT [facility] Provider Note   CSN: [id number] Arrival date & time: 12/28/23  1542     Patient presents with: Fever and Nasal Congestion   Diamond CURET is a 53 y.o. female.   53 year old female presenting emergency department with fever.  Reports for the past 8 days that she has had head cold with sinus congestion, rhinorrhea.  She had chemo on 8/5.  Noted to have a fever today.  She states that this has happened in the past where she has developed fever several days after her chemo.  No chest pain or shortness of breath no abdominal pain, no nausea no vomiting.   Fever      Prior to Admission medications   Medication Sig Start Date End Date Taking? Authorizing Provider  acetaminophen  (TYLENOL ) 325 MG tablet Take 2 tablets (650 mg total) by mouth every 6 (six) hours as needed for mild pain (pain score 1-3) (or Fever >/= 101). 07/27/23   Cornett, Debby, MD  carvedilol  (COREG ) 3.125 MG tablet TAKE ONE TABLET TWICE DAILY WITH MEAL(S) 11/19/23   Iruku, Praveena, MD  cyanocobalamin  (VITAMIN B12) 1000 MCG/ML injection INJECT 1 ML INTRAMUSCULARLY EVERY 15 DAYS Patient taking differently: Inject 1,000 mcg into the skin every 30 (thirty) days. 05/18/23   Gudena, Vinay, MD  cycloSPORINE  (RESTASIS ) 0.05 % ophthalmic emulsion Place 1 drop into both eyes 2 (two) times daily. 06/15/23   Iruku, Praveena, MD  EPINEPHrine  0.3 mg/0.3 mL IJ SOAJ injection Inject 0.3 mg into the muscle as needed for anaphylaxis. 12/16/21   Merlynn Niki FALCON, FNP  ergocalciferol  (VITAMIN D2) 1.25 MG (50000 UT) capsule Take 1 capsule (50,000 Units total) by mouth once a week. 11/19/23   Crawford Morna Pickle, NP  escitalopram  (LEXAPRO ) 20 MG tablet Take 1 tablet (20 mg total) by mouth daily. Please take citalopram 10 mg( half pill) only for next 3 days starting from tomorrow.  Then stop it until you finish the course of linezolid .  Can resume previous dose after  2 days of finishing  linezolid  08/26/23   Jillian Buttery, MD  gabapentin  (NEURONTIN ) 300 MG capsule Take 1 capsule (300 mg total) by mouth 3 (three) times daily. 11/26/23   Gudena, Vinay, MD  LORazepam  (ATIVAN ) 0.5 MG tablet Take 1 tablet (0.5 mg total) by mouth every 8 (eight) hours as needed for anxiety. 06/22/22   Iruku, Praveena, MD  oxyCODONE  (OXY IR/ROXICODONE ) 5 MG immediate release tablet Take 1 tablet (5 mg total) by mouth every 6 (six) hours as needed for severe pain (pain score 7-10). 07/27/23   Cornett, Debby, MD  pantoprazole  (PROTONIX ) 40 MG tablet Take 1 tablet (40 mg total) by mouth daily. Patient taking differently: Take 40 mg by mouth See admin instructions. Take 40 mg by mouth in the morning 30 minutes before breakfast and an additional 40 mg in the evening as needed for unresolved acid reflux symptoms 08/06/23   Zollie Lowers, MD  potassium chloride  (KLOR-CON ) 10 MEQ tablet TAKE TWO TABLETS DAILY AS DIRECTED 12/19/23   Iruku, Praveena, MD    Allergies: Phentermine , Bee venom, Lactose intolerance (gi), Peanut allergen powder-dnfp, Topiramate , Sulfa antibiotics, Tape, and Wound dressing adhesive    Review of Systems  Constitutional:  Positive for fever.    Updated Vital Signs BP (!) 117/55   Pulse 88   Temp 99.7 F (37.6 C) (Oral)   Resp 14   LMP 10/13/2019 (Approximate)   SpO2 96%   Physical Exam Vitals and  nursing note reviewed.  Constitutional:      General: She is not in acute distress.    Appearance: She is not toxic-appearing.  HENT:     Head: Normocephalic.     Comments: Does have some frontal and maxillary sinus tenderness    Nose: Nose normal.     Mouth/Throat:     Mouth: Mucous membranes are moist.  Eyes:     Conjunctiva/sclera: Conjunctivae normal.  Cardiovascular:     Rate and Rhythm: Normal rate and regular rhythm.  Pulmonary:     Effort: Pulmonary effort is normal.     Breath sounds: Normal breath sounds.  Abdominal:     General: Abdomen is flat. There is no distension.      Tenderness: There is no abdominal tenderness. There is no guarding or rebound.  Musculoskeletal:        General: Normal range of motion.  Skin:    General: Skin is warm and dry.     Capillary Refill: Capillary refill takes less than 2 seconds.  Neurological:     Mental Status: She is alert and oriented to person, place, and time.  Psychiatric:        Mood and Affect: Mood normal.        Behavior: Behavior normal.     (all labs ordered are listed, but only abnormal results are displayed) Labs Reviewed  COMPREHENSIVE METABOLIC PANEL WITH GFR - Abnormal; Notable for the following components:      Result Value   Sodium 134 (*)    Potassium 3.4 (*)    Glucose, Bld 110 (*)    Calcium 8.6 (*)    Albumin 3.1 (*)    AST 63 (*)    Alkaline Phosphatase 134 (*)    All other components within normal limits  CBC WITH DIFFERENTIAL/PLATELET - Abnormal; Notable for the following components:   RBC 3.80 (*)    Hemoglobin 8.6 (*)    HCT 28.5 (*)    MCV 75.0 (*)    MCH 22.6 (*)    RDW 20.6 (*)    All other components within normal limits  URINALYSIS, W/ REFLEX TO CULTURE (INFECTION SUSPECTED) - Abnormal; Notable for the following components:   Leukocytes,Ua SMALL (*)    Bacteria, UA MANY (*)    All other components within normal limits  RESP PANEL BY RT-PCR (RSV, FLU A&B, COVID)  RVPGX2  CULTURE, BLOOD (ROUTINE X 2)  CULTURE, BLOOD (ROUTINE X 2)  URINE CULTURE  I-STAT CG4 LACTIC ACID, ED  I-STAT CG4 LACTIC ACID, ED    EKG: None  Radiology: DG Chest 2 View Result Date: 12/28/2023 CLINICAL DATA:  Fever. EXAM: CHEST - 2 VIEW COMPARISON:  Oct 05, 2023. FINDINGS: The heart size and mediastinal contours are within normal limits. Right internal jugular Port-A-Cath is unchanged. Both lungs are clear. The visualized skeletal structures are unremarkable. IMPRESSION: No active cardiopulmonary disease. Electronically Signed   By: Lynwood Landy Raddle M.D.   On: 12/28/2023 16:51     Procedures    Medications Ordered in the ED  acetaminophen  (TYLENOL ) tablet 650 mg (650 mg Oral Given 12/28/23 1737)  lactated ringers  bolus 1,000 mL (0 mLs Intravenous Stopped 12/28/23 2038)  cefTRIAXone  (ROCEPHIN ) 2 g in sodium chloride  0.9 % 100 mL IVPB (0 g Intravenous Stopped 12/28/23 2038)    Clinical Course as of 12/28/23 2057  Fri Dec 28, 2023  1801 Spoke with on call oncologist, Dr. Timmy and reviewed patients presentation, vitals and labs over  the phone. Recommends treating with Augmentin  for sinusitis and close follow up in clinic.  [TY]    Clinical Course User Index [TY] Neysa Caron PARAS, DO                                 Medical Decision Making This is a 53 year old female presenting emergency department for fever.  She has a history of breast cancer status post mastectomy with Kadcyla .  Fever here, was given Tylenol  with improvement.  Heart rate borderline tachycardia on arrival, subsequent readings improved.  She has no leukocytosis to suggest systemic infection.  Nor does she have a leukopenia/neutropenic fever.  Stable anemia and some minor dehydration on her CMP.  Baseline renal function.  Minor elevation in AST and alk phos, no right upper quadrant tenderness or abdominal pain.  Appears has been elevated in the past.  Her lactate was negative.  Given her fever concern for possible viral etiology however flu/COVID/RSV negative.  Urine did have some evidence of urinary tract infection, but patient is asymptomatic.  Given a dose of Rocephin  here.  Discussed case with oncology, recommending antibiotics for sinusitis and can follow-up outpatient.  Patient is comfortable with plan and will return with any new or worrisome symptoms.  Amount and/or Complexity of Data Reviewed Independent Historian:     Details: Family members note baseline mentation and history  External Data Reviewed:     Details: Had infusion 8/5; no reported complications in chart Labs: ordered. Decision-making details  documented in ED Course. Radiology: ordered and independent interpretation performed.    Details: Do not appreciate pneumonia or pneumothorax Discussion of management or test interpretation with external provider(s): Oncology; see course  Risk OTC drugs. Decision regarding hospitalization.       Final diagnoses:  None    ED Discharge Orders     None          Neysa Caron PARAS, DO 12/28/23 2057

## 2023-12-28 NOTE — ED Triage Notes (Signed)
 Pt had chemo on Tuesday, having fever and congestion today. Last took Claritin  and sudafed this AM.

## 2023-12-28 NOTE — Telephone Encounter (Signed)
 This RN spoke with pt per her call at 236pm stating she has developed a fever of 103.6- she also is symptomatic for URI - which has been ongoing - fever is acute.  She stated  Dr Loretha knows I've ran fevers after getting treatment   This RN informed pt due to time of day and elavation of temp in setting of treatment with prior septic episode - she needs to proceed to the ER promptly for fever work up.  This RN explained due to time of day and fever work up needs this office would not be able to provide appropriate care.  Darby verbalized some mild denial of plan but per discussion and concern for worsen of symptoms she will proceed as discussed. Bryley verbalized she does have her chemo/immunotherapy card with her and understands to present it upon arrival to the ER.  This RN called [facility] ER charge nurse and gave report of pending arrival and above information.

## 2023-12-30 ENCOUNTER — Emergency Department (HOSPITAL_COMMUNITY)
Admission: EM | Admit: 2023-12-30 | Discharge: 2023-12-31 | Disposition: A | Discharge status: Home / Self Care | Attending: Emergency Medicine | Admitting: Emergency Medicine

## 2023-12-30 ENCOUNTER — Other Ambulatory Visit: Payer: Self-pay

## 2023-12-30 DIAGNOSIS — R7989 Other specified abnormal findings of blood chemistry: Secondary | ICD-10-CM

## 2023-12-30 DIAGNOSIS — Z853 Personal history of malignant neoplasm of breast: Secondary | ICD-10-CM | POA: Diagnosis not present

## 2023-12-30 DIAGNOSIS — Z91010 Allergy to peanuts: Secondary | ICD-10-CM | POA: Insufficient documentation

## 2023-12-30 DIAGNOSIS — E039 Hypothyroidism, unspecified: Secondary | ICD-10-CM | POA: Insufficient documentation

## 2023-12-30 DIAGNOSIS — R7881 Bacteremia: Secondary | ICD-10-CM | POA: Diagnosis not present

## 2023-12-30 DIAGNOSIS — R799 Abnormal finding of blood chemistry, unspecified: Secondary | ICD-10-CM | POA: Insufficient documentation

## 2023-12-30 DIAGNOSIS — I10 Essential (primary) hypertension: Secondary | ICD-10-CM | POA: Insufficient documentation

## 2023-12-30 DIAGNOSIS — Z8616 Personal history of COVID-19: Secondary | ICD-10-CM | POA: Insufficient documentation

## 2023-12-30 LAB — COMPREHENSIVE METABOLIC PANEL WITH GFR
ALT: 23 U/L (ref 0–44)
AST: 82 U/L — ABNORMAL HIGH (ref 15–41)
Albumin: 3.2 g/dL — ABNORMAL LOW (ref 3.5–5.0)
Alkaline Phosphatase: 132 U/L — ABNORMAL HIGH (ref 38–126)
Anion gap: 11 (ref 5–15)
BUN: 9 mg/dL (ref 6–20)
CO2: 24 mmol/L (ref 22–32)
Calcium: 8.7 mg/dL — ABNORMAL LOW (ref 8.9–10.3)
Chloride: 103 mmol/L (ref 98–111)
Creatinine, Ser: 0.73 mg/dL (ref 0.44–1.00)
GFR, Estimated: >60 mL/min (ref >60)
Glucose, Bld: 96 mg/dL (ref 70–99)
Potassium: 3.2 mmol/L — ABNORMAL LOW (ref 3.5–5.1)
Sodium: 138 mmol/L (ref 135–145)
Total Bilirubin: 1 mg/dL (ref 0.0–1.2)
Total Protein: 7.5 g/dL (ref 6.5–8.1)

## 2023-12-30 MED ORDER — GABAPENTIN 300 MG PO CAPS
300.0000 mg | ORAL_CAPSULE | Freq: Once | ORAL | Status: AC
  Administered 2023-12-30: 300 mg via ORAL
  Filled 2023-12-30: qty 1

## 2023-12-30 NOTE — ED Provider Notes (Incomplete)
 Care of patient received from prior provider at 11:00 PM, please see their note for complete H/P and care plan.  Received handoff per ED course.  Clinical Course as of 12/30/23 2300  Sun Dec 30, 2023  2258 Stable Chief complaint of abnormal labs GPR+ blood culture chemotherapy. Only 1/4 bottles Pending labs. Completely asymptomatic Will need to be repeated. [CC]  2259 Signed out pending urinalysis and CBC. [WS]    Clinical Course User Index [CC] Jerral Meth, MD [WS] Francesca Elsie CROME, MD    Reassessment: ***

## 2023-12-30 NOTE — ED Triage Notes (Signed)
 Patient arrived stating she was here a few days ago and told she had positive cultures and told to return. No complaints at this time.

## 2023-12-30 NOTE — ED Provider Notes (Signed)
 [place] EMERGENCY DEPARTMENT AT [facility] Provider Note  CSN: [id number] Arrival date & time: 12/30/23 1906  Chief Complaint(s) Abnormal Labs  HPI Diamond Marshall is a 53 y.o. female history of breast cancer currently undergoing chemotherapy, presenting to the emergency department with abnormal lab.  Patient was recently in the emergency department a couple days ago with a fever.  Workup was overall reassuring however blood cultures were obtained given the patient's chemotherapy status.  Blood cultures today resulted positive in 1 of 4 bottles.  She reports overall she has been doing well.  She was treated for possible sinusitis and reports this is no longer present.  Denies any abdominal pain, chest pain, cough, fevers or chills, diarrhea, painful urination.  She did report some pain in her right flank that it has been bothering her.  Past Medical History Past Medical History:  Diagnosis Date   Abdominal hernia    Anemia    b12 deficiency   Anxiety    Arthritis    B12 deficiency    Back pain    Cancer (HCC)    breast cancer   Depression    Family history of adverse reaction to anesthesia    mother vomits after anesthesia   Gallbladder problem    GERD (gastroesophageal reflux disease)    History of hiatal hernia    small per patient   Hypertension    Hypothyroidism    Joint pain    Knee pain    Palpitation    when iron is low   Pernicious anemia    SOB (shortness of breath)    Thyroid  disease    hypothyroidism   Vitamin D  deficiency    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   Breast cancer, stage 4 (HCC) 07/26/2023   Gastroenteritis 09/29/2022   Port-A-Cath in place 07/07/2022   Genetic testing 06/29/2022   Malignant neoplasm of upper-inner quadrant of left breast in female, estrogen  receptor positive (HCC) 06/19/2022   Chronic pain of right knee 03/27/2022   Essential hypertension 03/27/2022   Positive self-administered antigen test for COVID-19 03/15/2021   Prediabetes 07/22/2019   Anxiety and depression 03/15/2016   Vitamin D  deficiency 03/15/2016   Metabolic syndrome X 08/07/2013   Elevated random blood glucose level 08/07/2013   Hypertriglyceridemia 08/07/2013   ADD (attention deficit disorder) 07/24/2013    hyperlipidemia 10/09/2012   Morbid obesity (HCC) 10/09/2012   Hypothyroidism 10/09/2012   B12 deficiency 10/09/2012   Anemia, unspecified 02/12/2011   Dysthymic disorder 02/12/2011   Migraine headache 02/12/2011   Obsessive-compulsive disorder 02/12/2011   Tinnitus 02/12/2011   Home Medication(s) Prior to Admission medications   Medication Sig Start Date End Date Taking? Authorizing Provider  acetaminophen  (TYLENOL ) 325 MG tablet Take 2 tablets (650 mg total) by mouth every 6 (six) hours as needed for mild pain (pain score 1-3) (or Fever >/= 101). 07/27/23   [name], MD  amoxicillin -clavulanate (AUGMENTIN ) 875-125 MG tablet Take 1 tablet by mouth every 12 (twelve) hours for 7 days. 12/28/23 01/04/24  Neysa Caron PARAS, DO  carvedilol  (COREG ) 3.125 MG tablet TAKE ONE TABLET TWICE DAILY WITH MEAL(S) 11/19/23   Iruku, Praveena, MD  cyanocobalamin  (VITAMIN B12) 1000 MCG/ML injection INJECT 1 ML INTRAMUSCULARLY EVERY 15 DAYS Patient taking differently: Inject 1,000 mcg into the skin every 30 (thirty) days.  05/18/23   Gudena, Vinay, MD  cycloSPORINE  (RESTASIS ) 0.05 % ophthalmic emulsion Place 1 drop into both eyes 2 (two) times daily. 06/15/23   Iruku, Praveena, MD  EPINEPHrine  0.3 mg/0.3 mL IJ SOAJ injection Inject 0.3 mg into the muscle as needed for anaphylaxis. 12/16/21   Merlynn Niki FALCON, FNP  ergocalciferol  (VITAMIN D2) 1.25 MG (50000 UT) capsule Take 1 capsule (50,000 Units total) by mouth once a week. 11/19/23   Crawford Morna Pickle, NP  escitalopram   (LEXAPRO ) 20 MG tablet Take 1 tablet (20 mg total) by mouth daily. Please take citalopram 10 mg( half pill) only for next 3 days starting from tomorrow.  Then stop it until you finish the course of linezolid .  Can resume previous dose after  2 days of finishing linezolid  08/26/23   Jillian Buttery, MD  gabapentin  (NEURONTIN ) 300 MG capsule Take 1 capsule (300 mg total) by mouth 3 (three) times daily. 11/26/23   Gudena, Vinay, MD  LORazepam  (ATIVAN ) 0.5 MG tablet Take 1 tablet (0.5 mg total) by mouth every 8 (eight) hours as needed for anxiety. 06/22/22   Iruku, Praveena, MD  oxyCODONE  (OXY IR/ROXICODONE ) 5 MG immediate release tablet Take 1 tablet (5 mg total) by mouth every 6 (six) hours as needed for severe pain (pain score 7-10). 07/27/23   Cornett, Debby, MD  pantoprazole  (PROTONIX ) 40 MG tablet Take 1 tablet (40 mg total) by mouth daily. Patient taking differently: Take 40 mg by mouth See admin instructions. Take 40 mg by mouth in the morning 30 minutes before breakfast and an additional 40 mg in the evening as needed for unresolved acid reflux symptoms 08/06/23   Zollie Lowers, MD  potassium chloride  (KLOR-CON ) 10 MEQ tablet TAKE TWO TABLETS DAILY AS DIRECTED 12/19/23   [name], MD                                                                                                                                    Past Surgical History Past Surgical History:  Procedure Laterality Date   BREAST BIOPSY Left 06/09/2022   US  LT BREAST BX W LOC DEV 1ST LESION IMG BX SPEC US  GUIDE 06/09/2022 [facility] MAMMOGRAPHY   CESAREAN SECTION  1999   Dilatation and currettagement     for miscarriage 18 years ago   PORTACATH PLACEMENT Right 07/05/2022   Procedure: INSERTION PORT-A-CATH;  Surgeon: Vanderbilt Debby, MD;  Location: [facility] OR;  Service: General;  Laterality: Right;   SIMPLE MASTECTOMY WITH AXILLARY SENTINEL NODE BIOPSY Bilateral 07/26/2023   Procedure: LEFT SKIN REDUCING MASTECTOMY, RIGHT SKIN SPARING, RISK  REDUCING MASTECTOMY;  Surgeon: Vanderbilt Debby, MD;  Location: [facility] OR;  Service: General;  Laterality: Bilateral;  PEC BLOCK   Family History Family History  Problem Relation Age of Onset   Hypertension Mother    Diabetes Mother    High Cholesterol Mother    Thyroid  disease Mother    Depression Mother  Anxiety disorder Mother    Obesity Mother    CVA Father        hemorrhagic   Ovarian cancer Maternal Aunt 73   Colon cancer Maternal Aunt    Breast cancer Cousin 12       maternal first cousin, reports negative genetic testing    Social History Social History   Tobacco Use   Smoking status: Never   Smokeless tobacco: Never  Vaping Use   Vaping status: Never Used  Substance Use Topics   Alcohol use: No   Drug use: No   Allergies Phentermine , Bee venom, Lactose intolerance (gi), Peanut allergen powder-dnfp, Topiramate , Sulfa antibiotics, Tape, and Wound dressing adhesive  Review of Systems Review of Systems  All other systems reviewed and are negative.   Physical Exam Vital Signs  I have reviewed the triage vital signs BP 133/72 (BP Location: Left Arm)   Pulse 90   Temp 98.1 F (36.7 C) (Oral)   Resp 13   Ht 5' 1 (1.549 m)   Wt 124.2 kg   LMP 10/13/2019 (Approximate)   SpO2 100%   BMI 51.73 kg/m  Physical Exam Vitals and nursing note reviewed.  Constitutional:      General: She is not in acute distress.    Appearance: She is well-developed.  HENT:     Head: Normocephalic and atraumatic.     Mouth/Throat:     Mouth: Mucous membranes are moist.  Eyes:     Pupils: Pupils are equal, round, and reactive to light.  Cardiovascular:     Rate and Rhythm: Normal rate and regular rhythm.     Heart sounds: No murmur heard. Pulmonary:     Effort: Pulmonary effort is normal. No respiratory distress.     Breath sounds: Normal breath sounds.  Abdominal:     General: Abdomen is flat.     Palpations: Abdomen is soft.     Tenderness: There is no abdominal  tenderness. There is right CVA tenderness. There is no left CVA tenderness.  Musculoskeletal:        General: No tenderness.     Right lower leg: No edema.     Left lower leg: No edema.  Skin:    General: Skin is warm and dry.  Neurological:     General: No focal deficit present.     Mental Status: She is alert. Mental status is at baseline.  Psychiatric:        Mood and Affect: Mood normal.        Behavior: Behavior normal.     ED Results and Treatments Labs (all labs ordered are listed, but only abnormal results are displayed) Labs Reviewed  COMPREHENSIVE METABOLIC PANEL WITH GFR - Abnormal; Notable for the following components:      Result Value   Potassium 3.2 (*)    Calcium 8.7 (*)    Albumin 3.2 (*)    AST 82 (*)    Alkaline Phosphatase 132 (*)    All other components within normal limits  URINALYSIS, W/ REFLEX TO CULTURE (INFECTION SUSPECTED) - Abnormal; Notable for the following components:   APPearance HAZY (*)    Leukocytes,Ua MODERATE (*)    Bacteria, UA RARE (*)    All other components within normal limits  CBC WITH DIFFERENTIAL/PLATELET - Abnormal; Notable for the following components:   RBC 3.60 (*)    Hemoglobin 8.1 (*)    HCT 27.2 (*)    MCV 75.6 (*)    MCH 22.5 (*)  MCHC 29.8 (*)    RDW 20.7 (*)    All other components within normal limits  CULTURE, BLOOD (ROUTINE X 2)  CULTURE, BLOOD (ROUTINE X 2)  CBC WITH DIFFERENTIAL/PLATELET                                                                                                                          Radiology No results found.  Pertinent labs & imaging results that were available during my care of the patient were reviewed by me and considered in my medical decision making (see MDM for details).  Medications Ordered in ED Medications  gabapentin  (NEURONTIN ) capsule 300 mg (300 mg Oral Given 12/30/23 2245)                                                                                                                                      Procedures Procedures  (including critical care time)  Medical Decision Making / ED Course   MDM:  53 year old presenting to the emergency department for positive blood culture.  Patient overall well-appearing, blood culture grew gram-positive rods in 1 of 4 bottle.  Suspect likely contaminant.  Patient has overall been feeling well.  She was not neutropenic previously. she was treated for possible sinusitis and reports that the symptoms have all resolved.  The only symptom she has currently has some right flank pain and has some right CVA tenderness.  Will check urinalysis as well as repeat cultures, labs.  If testing is all reassuring, likely can go home but would discuss with oncology . Clinical Course as of 12/31/23 1951  [place] Dec 30, 2023  2258 Stable Chief complaint of abnormal labs GPR+ blood culture chemotherapy. Only 1/4 bottles Pending labs. Completely asymptomatic Will need to be repeated. [CC]  2259 Signed out pending urinalysis and CBC. [WS]    Clinical Course User Index [CC] Jerral Meth, MD [WS] Francesca, Elsie CROME, MD     Additional history obtained: -Additional history obtained from spouse -External records from outside source obtained and reviewed including: Chart review including previous notes, labs, imaging, consultation notes including recent ER visit    Lab Tests: -I ordered, reviewed, and interpreted labs.   The pertinent results include:   Labs Reviewed  COMPREHENSIVE METABOLIC PANEL WITH GFR - Abnormal; Notable for the following components:      Result Value   Potassium 3.2 (*)  Calcium 8.7 (*)    Albumin 3.2 (*)    AST 82 (*)    Alkaline Phosphatase 132 (*)    All other components within normal limits  URINALYSIS, W/ REFLEX TO CULTURE (INFECTION SUSPECTED) - Abnormal; Notable for the following components:   APPearance HAZY (*)    Leukocytes,Ua MODERATE (*)    Bacteria, UA RARE (*)    All  other components within normal limits  CBC WITH DIFFERENTIAL/PLATELET - Abnormal; Notable for the following components:   RBC 3.60 (*)    Hemoglobin 8.1 (*)    HCT 27.2 (*)    MCV 75.6 (*)    MCH 22.5 (*)    MCHC 29.8 (*)    RDW 20.7 (*)    All other components within normal limits  CULTURE, BLOOD (ROUTINE X 2)  CULTURE, BLOOD (ROUTINE X 2)  CBC WITH DIFFERENTIAL/PLATELET    Notable for chronic anemia, mild hypokalemia   EKG   EKG Interpretation Date/Time:    Ventricular Rate:    PR Interval:    QRS Duration:    QT Interval:    QTC Calculation:   R Axis:      Text Interpretation:            Medicines ordered and prescription drug management: Meds ordered this encounter  Medications   gabapentin  (NEURONTIN ) capsule 300 mg    -I have reviewed the patients home medicines and have made adjustments as needed  Social Determinants of Health:  Diagnosis or treatment significantly limited by social determinants of health: obesity    Co morbidities that complicate the patient evaluation  Past Medical History:  Diagnosis Date   Abdominal hernia    Anemia    b12 deficiency   Anxiety    Arthritis    B12 deficiency    Back pain    Cancer (HCC)    breast cancer   Depression    Family history of adverse reaction to anesthesia    mother vomits after anesthesia   Gallbladder problem    GERD (gastroesophageal reflux disease)    History of hiatal hernia    small per patient   Hypertension    Hypothyroidism    Joint pain    Knee pain    Palpitation    when iron is low   Pernicious anemia    SOB (shortness of breath)    Thyroid  disease    hypothyroidism   Vitamin D  deficiency       Dispostion: Disposition decision including need for hospitalization was considered, and patient disposition pending at time of sign out.    Final Clinical Impression(s) / ED Diagnoses Final diagnoses:  Blood culture positive for microorganism     This chart was  dictated using voice recognition software.  Despite best efforts to proofread,  errors can occur which can change the documentation meaning.    Francesca Elsie CROME, MD 12/31/23 [phone]

## 2023-12-31 LAB — CBC WITH DIFFERENTIAL/PLATELET
Abs Immature Granulocytes: 0.04 K/uL (ref 0.00–0.07)
Basophils Absolute: 0 K/uL (ref 0.0–0.1)
Basophils Relative: 0 %
Eosinophils Absolute: 0.2 K/uL (ref 0.0–0.5)
Eosinophils Relative: 3 %
HCT: 27.2 % — ABNORMAL LOW (ref 36.0–46.0)
Hemoglobin: 8.1 g/dL — ABNORMAL LOW (ref 12.0–15.0)
Immature Granulocytes: 1 %
Lymphocytes Relative: 38 %
Lymphs Abs: 1.8 K/uL (ref 0.7–4.0)
MCH: 22.5 pg — ABNORMAL LOW (ref 26.0–34.0)
MCHC: 29.8 g/dL — ABNORMAL LOW (ref 30.0–36.0)
MCV: 75.6 fL — ABNORMAL LOW (ref 80.0–100.0)
Monocytes Absolute: 0.4 K/uL (ref 0.1–1.0)
Monocytes Relative: 9 %
Neutro Abs: 2.4 K/uL (ref 1.7–7.7)
Neutrophils Relative %: 49 %
Platelets: 166 K/uL (ref 150–400)
RBC: 3.6 MIL/uL — ABNORMAL LOW (ref 3.87–5.11)
RDW: 20.7 % — ABNORMAL HIGH (ref 11.5–15.5)
WBC: 4.9 K/uL (ref 4.0–10.5)
nRBC: 0 % (ref 0.0–0.2)

## 2023-12-31 LAB — URINALYSIS, W/ REFLEX TO CULTURE (INFECTION SUSPECTED)
Bilirubin Urine: NEGATIVE
Glucose, UA: NEGATIVE mg/dL
Hgb urine dipstick: NEGATIVE
Ketones, ur: NEGATIVE mg/dL
Nitrite: NEGATIVE
Protein, ur: NEGATIVE mg/dL
Specific Gravity, Urine: 1.016 (ref 1.005–1.030)
pH: 6 (ref 5.0–8.0)

## 2023-12-31 LAB — CULTURE, BLOOD (ROUTINE X 2)
Culture  Setup Time: NO GROWTH
Special Requests: ADEQUATE

## 2023-12-31 NOTE — ED Provider Notes (Signed)
 Care of patient received from prior provider at 1:31 AM, please see their note for complete H/P and care plan.  Received handoff per ED course.  Clinical Course as of 12/31/23 0131  [place] Dec 30, 2023  2258 Stable Chief complaint of abnormal labs GPR+ blood culture chemotherapy. Only 1/4 bottles Pending labs. Completely asymptomatic Will need to be repeated. [CC]  2259 Signed out pending urinalysis and CBC. [WS]    Clinical Course User Index [CC] Jerral Meth, MD [WS] Francesca Elsie CROME, MD    Reassessment: Called to bedside.  Patient's urinalysis been sent off to the lab for evaluation but she does not want to stay for the sample.  She states has been afebrile and has follow-up with her oncologist in the outpatient setting.  She is in no acute distress at this time.  We discussed risk of missed disease progression of illness without completing her evaluation the patient expressed understanding of risk.  Disposition:  Patient is requesting discharge at this time.  Given patient's understanding of risk of severe missed diagnosis based on limitations of today's evaluation and risk of interval worsening of disease including life or limb threatening pathology, will participate in shared medical decision making and patient directed discharge at this time.  Patient is welcome to return for further diagnostic evaluation/therapeutic management at any time.      Jerral Meth, MD 12/31/23 [phone]

## 2024-01-01 ENCOUNTER — Telehealth (HOSPITAL_BASED_OUTPATIENT_CLINIC_OR_DEPARTMENT_OTHER): Payer: Self-pay | Admitting: *Deleted

## 2024-01-01 LAB — URINE CULTURE

## 2024-01-01 NOTE — Telephone Encounter (Signed)
 Post ED Visit - Positive Culture Follow-up  Culture report reviewed by antimicrobial stewardship pharmacist: [facility] [id number], Pharm.D. []  Venetia Gully, Pharm.D., BCPS AQ-ID []  Garrel Crews, Pharm.D., BCPS []  Almarie Lunger, [address].D., BCPS []  [place], [place].D., BCPS, AAHIVP []  Rosaline Bihari, Pharm.D., BCPS, AAHIVP []  Vernell Meier, PharmD, BCPS []  Latanya Hint, PharmD, BCPS []  Donald Medley, PharmD, BCPS []  Rocky Bold, PharmD []  Dorothyann Alert, PharmD, BCPS []  Morene Babe, PharmD  [facility] [x]  Izetta Carl, PharmD []  Romona Bliss, PharmD []  Dolphus Roller, PharmD []  Veva Seip, Rph []  Vernell Daunt) Leonce, PharmD []  Eva Allis, PharmD []  Rosaline Millet, PharmD []  Iantha Batch, PharmD []  Arvin Gauss, PharmD []  Wanda Hasting, PharmD []  Ronal Rav, PharmD []  Rocky Slade, PharmD []  Bard Jeans, PharmD   Positive blood culture Treated with Amoxicillin - Pot Clavulanate, Patient seen in ED and ED physician deen Blood cx 1/4 as contaminant. Patient also being followed by oncology. No further patient follow-up is required at this time.  Diamond Marshall 01/01/2024, 10:24 AM

## 2024-01-01 NOTE — Telephone Encounter (Signed)
 Post ED Visit - Positive Culture Follow-up  Culture report reviewed by antimicrobial stewardship pharmacist: [facility] []  [place], Pharm.D. []  Venetia Gully, Pharm.D., BCPS AQ-ID []  Garrel Crews, Pharm.D., BCPS []  Almarie Lunger, Pharm.D., BCPS []  [place], [place].D., BCPS, AAHIVP []  Rosaline Bihari, Pharm.D., BCPS, AAHIVP []  Vernell Meier, PharmD, BCPS []  Latanya Hint, PharmD, BCPS []  Donald Medley, PharmD, BCPS []  Rocky Bold, PharmD []  Dorothyann Alert, PharmD, BCPS []  Morene Babe, PharmD  [facility] [x]  Izetta Carl, PharmD []  Romona Bliss, PharmD []  Dolphus Roller, PharmD []  Veva Seip, Rph []  Vernell Daunt) Leonce, PharmD []  Eva Allis, PharmD []  Rosaline Millet, PharmD []  Iantha Batch, PharmD []  Arvin Gauss, PharmD []  Wanda Hasting, PharmD []  Ronal Rav, PharmD []  Rocky Slade, PharmD []  Bard Jeans, PharmD   Positive blood culture Treated with Amoxicillin - Pot Clavulanate, Patient seen in ED and ED physician deen Blood cx 1/4 as contaminant. Patient also being followed by oncology. No further patient follow-up is required at this time.  Diamond Marshall 01/01/2024, 10:24 AM

## 2024-01-02 ENCOUNTER — Encounter: Payer: Self-pay | Admitting: Hematology and Oncology

## 2024-01-02 ENCOUNTER — Other Ambulatory Visit: Payer: Self-pay | Admitting: Hematology and Oncology

## 2024-01-02 LAB — CULTURE, BLOOD (ROUTINE X 2)
Culture: NO GROWTH
Special Requests: ADEQUATE

## 2024-01-03 ENCOUNTER — Other Ambulatory Visit: Payer: Self-pay | Admitting: *Deleted

## 2024-01-03 ENCOUNTER — Encounter: Payer: Self-pay | Admitting: Hematology and Oncology

## 2024-01-04 ENCOUNTER — Encounter: Payer: Self-pay | Admitting: Hematology and Oncology

## 2024-01-04 ENCOUNTER — Other Ambulatory Visit: Payer: Self-pay | Admitting: Hematology and Oncology

## 2024-01-04 ENCOUNTER — Other Ambulatory Visit: Payer: Self-pay | Admitting: *Deleted

## 2024-01-04 LAB — CULTURE, BLOOD (ROUTINE X 2)
Culture: NO GROWTH
Culture: NO GROWTH

## 2024-01-04 MED ORDER — PREGABALIN 50 MG PO CAPS: 50.0000 mg | ORAL_CAPSULE | Freq: Two times a day (BID) | ORAL | 0 refills | Status: DC

## 2024-01-04 MED ORDER — OXYCODONE HCL 5 MG PO TABS: 5.0000 mg | ORAL_TABLET | Freq: Four times a day (QID) | ORAL | 0 refills | Status: AC | PRN

## 2024-01-08 ENCOUNTER — Ambulatory Visit (HOSPITAL_COMMUNITY)
Admission: RE | Admit: 2024-01-08 | Discharge: 2024-01-08 | Disposition: A | Discharge status: Home / Self Care | Source: Ambulatory Visit | Attending: Hematology and Oncology | Admitting: Hematology and Oncology

## 2024-01-08 ENCOUNTER — Encounter: Payer: Self-pay | Admitting: Hematology and Oncology

## 2024-01-08 DIAGNOSIS — C50212 Malignant neoplasm of upper-inner quadrant of left female breast: Secondary | ICD-10-CM | POA: Diagnosis present

## 2024-01-08 DIAGNOSIS — Z170 Estrogen receptor positive status [ER+]: Secondary | ICD-10-CM | POA: Insufficient documentation

## 2024-01-08 MED ORDER — IOHEXOL 300 MG/ML  SOLN
100.0000 mL | Freq: Once | INTRAMUSCULAR | Status: AC | PRN
  Administered 2024-01-08: 100 mL via INTRAVENOUS

## 2024-01-08 MED ORDER — HEPARIN SOD (PORK) LOCK FLUSH 100 UNIT/ML IV SOLN
500.0000 [IU] | Freq: Once | INTRAVENOUS | Status: AC
  Administered 2024-01-08: 500 [IU] via INTRAVENOUS

## 2024-01-11 ENCOUNTER — Telehealth: Payer: Self-pay

## 2024-01-11 NOTE — Telephone Encounter (Signed)
 Pt called to find out why CT hasn't been read yet. Advised radiology asks us  to allow 7 business days for results and that if results aren't available Monday, I will call to request expedited read for Tuesdays visit. She verbalized thanks and agreement.

## 2024-01-14 ENCOUNTER — Other Ambulatory Visit: Payer: Self-pay | Admitting: Hematology and Oncology

## 2024-01-15 ENCOUNTER — Other Ambulatory Visit: Payer: Self-pay

## 2024-01-15 ENCOUNTER — Inpatient Hospital Stay (HOSPITAL_BASED_OUTPATIENT_CLINIC_OR_DEPARTMENT_OTHER): Admitting: Hematology and Oncology

## 2024-01-15 ENCOUNTER — Inpatient Hospital Stay

## 2024-01-15 VITALS — BP 132/68 | HR 78 | Resp 17

## 2024-01-15 VITALS — BP 154/80 | HR 80 | Temp 98.7°F | Resp 18 | Wt 269.7 lb

## 2024-01-15 DIAGNOSIS — C50212 Malignant neoplasm of upper-inner quadrant of left female breast: Secondary | ICD-10-CM

## 2024-01-15 DIAGNOSIS — Z170 Estrogen receptor positive status [ER+]: Secondary | ICD-10-CM

## 2024-01-15 DIAGNOSIS — Z5112 Encounter for antineoplastic immunotherapy: Secondary | ICD-10-CM | POA: Diagnosis not present

## 2024-01-15 DIAGNOSIS — Z95828 Presence of other vascular implants and grafts: Secondary | ICD-10-CM

## 2024-01-15 LAB — CMP (CANCER CENTER ONLY)
ALT: 15 U/L (ref 0–44)
AST: 34 U/L (ref 15–41)
Albumin: 3.4 g/dL — ABNORMAL LOW (ref 3.5–5.0)
Alkaline Phosphatase: 122 U/L (ref 38–126)
Anion gap: 5 (ref 5–15)
BUN: 10 mg/dL (ref 6–20)
CO2: 28 mmol/L (ref 22–32)
Calcium: 8.9 mg/dL (ref 8.9–10.3)
Chloride: 106 mmol/L (ref 98–111)
Creatinine: 0.61 mg/dL (ref 0.44–1.00)
GFR, Estimated: >60 mL/min (ref >60)
Glucose, Bld: 90 mg/dL (ref 70–99)
Potassium: 3.7 mmol/L (ref 3.5–5.1)
Sodium: 139 mmol/L (ref 135–145)
Total Bilirubin: 0.8 mg/dL (ref 0.0–1.2)
Total Protein: 7.3 g/dL (ref 6.5–8.1)

## 2024-01-15 LAB — CBC WITH DIFFERENTIAL (CANCER CENTER ONLY)
Abs Immature Granulocytes: 0.02 K/uL (ref 0.00–0.07)
Basophils Absolute: 0.1 K/uL (ref 0.0–0.1)
Basophils Relative: 1 %
Eosinophils Absolute: 0.2 K/uL (ref 0.0–0.5)
Eosinophils Relative: 3 %
HCT: 28.2 % — ABNORMAL LOW (ref 36.0–46.0)
Hemoglobin: 8.7 g/dL — ABNORMAL LOW (ref 12.0–15.0)
Immature Granulocytes: 0 %
Lymphocytes Relative: 33 %
Lymphs Abs: 2 K/uL (ref 0.7–4.0)
MCH: 22.3 pg — ABNORMAL LOW (ref 26.0–34.0)
MCHC: 30.9 g/dL (ref 30.0–36.0)
MCV: 72.3 fL — ABNORMAL LOW (ref 80.0–100.0)
Monocytes Absolute: 0.4 K/uL (ref 0.1–1.0)
Monocytes Relative: 6 %
Neutro Abs: 3.4 K/uL (ref 1.7–7.7)
Neutrophils Relative %: 57 %
Platelet Count: 220 K/uL (ref 150–400)
RBC: 3.9 MIL/uL (ref 3.87–5.11)
RDW: 20.5 % — ABNORMAL HIGH (ref 11.5–15.5)
WBC Count: 6 K/uL (ref 4.0–10.5)
nRBC: 0 % (ref 0.0–0.2)

## 2024-01-15 MED ORDER — ACETAMINOPHEN 325 MG PO TABS
650.0000 mg | ORAL_TABLET | Freq: Once | ORAL | Status: AC
  Administered 2024-01-15: 650 mg via ORAL
  Filled 2024-01-15: qty 2

## 2024-01-15 MED ORDER — SODIUM CHLORIDE 0.9% FLUSH
10.0000 mL | Freq: Once | INTRAVENOUS | Status: AC
  Administered 2024-01-15: 10 mL

## 2024-01-15 MED ORDER — SODIUM CHLORIDE 0.9 % IV SOLN
3.0000 mg/kg | Freq: Once | INTRAVENOUS | Status: AC
  Administered 2024-01-15: 400 mg via INTRAVENOUS
  Filled 2024-01-15: qty 20

## 2024-01-15 MED ORDER — PROCHLORPERAZINE MALEATE 10 MG PO TABS
10.0000 mg | ORAL_TABLET | Freq: Once | ORAL | Status: AC
  Administered 2024-01-15: 10 mg via ORAL
  Filled 2024-01-15: qty 1

## 2024-01-15 MED ORDER — LORATADINE 10 MG PO TABS
10.0000 mg | ORAL_TABLET | Freq: Once | ORAL | Status: AC
  Administered 2024-01-15: 10 mg via ORAL
  Filled 2024-01-15: qty 1

## 2024-01-15 MED ORDER — SODIUM CHLORIDE 0.9 % IV SOLN: INTRAVENOUS | Status: DC

## 2024-01-15 NOTE — Progress Notes (Signed)
 [facility] [phone]   [phone]  PROGRESS NOTE  Patient Care Team: Zollie Lowers, MD as PCP - General (Family Medicine) [name], MD as Consulting Physician (General Surgery) [name], MD as Consulting Physician (Hematology and Oncology) Izell Domino, MD as Attending Physician (Radiation Oncology)   CHIEF COMPLAINTS/PURPOSE OF CONSULTATION:  FU before next cycle of kadcyla .  Oncology History  Malignant neoplasm of upper-inner quadrant of left breast in female, estrogen receptor positive (HCC)  06/06/2022 Mammogram   Diagnostic mammogram given palpable mass showed suspicious spiculated left breast mass with surrounding nodularity at 9 o'clock position.  This measures up to 3.1 cm sonographically however due to deep location and patient body habitus the mammographic measurement of 5.8 cm is felt to be more accurate.  At least 2 abnormal lymph nodes in the left axilla.  Possible abnormal palpable left supraclavicular lymph node.   06/06/2022 Breast US    Breast ultrasound confirmed these findings.   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  Prognostic showed ER 65% weak to strong staining, PR 15% weak to moderate staining, Ki-67 of 50% and HER2 positive by IHC at 3+    Genetic Testing   [facility] Custom Panel+RNA was Negative. Report date is 06/28/2022.   The Custom Hereditary Cancers Panel offered by [facility] includes sequencing and/or deletion duplication testing of the following 43 genes: APC, ATM, AXIN2, BAP1, BARD1, BMPR1A, BRCA1, BRCA2, BRIP1, CDH1, CDK4, CDKN2A (p14ARF and p16INK4a only), CHEK2, CTNNA1, EPCAM (Deletion/duplication testing only), FH, GREM1 (promoter region duplication testing only), HOXB13, KIT, MBD4, MEN1, MLH1, MSH2, MSH3, MSH6, MUTYH, NF1, NHTL1,  PALB2, PDGFRA, PMS2, POLD1, POLE, PTEN, RAD51C, RAD51D, SMAD4, SMARCA4. STK11, TP53, TSC1, TSC2, and VHL.    06/21/2022 Cancer Staging   Staging form: Breast, AJCC 8th Edition - Clinical stage from 06/21/2022: Stage IV (cT3, cN3, cM1, G3, ER+, PR+, HER2+) - Signed by [name], MD on 09/17/2023 Stage prefix: Initial diagnosis Histologic grading system: 3 grade system Stage used in treatment planning: Yes National guidelines used in treatment planning: Yes Type of national guideline used in treatment planning: NCCN   06/29/2022 PET scan   IMPRESSION: 1. Hypermetabolic left breast mass, compatible with primary breast malignancy. 2. Hypermetabolic left cervical, axillary, subpectoral, supraclavicular and prevascular mediastinal lymph nodes, compatible with nodal metastatic disease. 3. No evidence of metastatic disease in the abdomen or pelvis. 4. Aortic Atherosclerosis (ICD10-I70.0).     Electronically Signed   By: [name] M.D.   On: 06/29/2022 09:32   07/11/2022 - 01/16/2023 Chemotherapy   Patient is on Treatment Plan : BREAST DOCEtaxel  + Trastuzumab  + Pertuzumab  (THP) q21d x 8 cycles / Trastuzumab  + Pertuzumab  q21d x 4 cycles     02/01/2023 - 06/29/2023 Chemotherapy   Patient is on Treatment Plan : BREAST METASTATIC Fam-Trastuzumab Deruxtecan-nxki  (Enhertu ) (5.4) q21d     10/02/2023 -  Chemotherapy   Patient is on Treatment Plan : BREAST ADO-Trastuzumab Emtansine  (Kadcyla ) q21d        HISTORY OF PRESENTING ILLNESS:  Diamond Marshall 53 y.o. female who is under the care of Dr. Loretha for breast cancer. She is currently receiving Kadcyla  every 21 days.  Discussed the use of AI scribe software for clinical note transcription with the patient, who gave verbal consent to proceed.  History of Present Illness Diamond Marshall is  a 53 year old female with breast cancer who presents for follow up of kadcyla .   Diamond Marshall is a 53 year old female who presents with neuropathy  symptoms and for review of CT results.  She experiences neuropathy primarily affecting her pinky fingers, describing them as 'dead as can be,' with numbness extending to her ring fingers and slight weakness in her left hand. She associates the onset of these symptoms with the use of Kadcyla , although she also changed her keyboard around the same time. Lyrica  provides some relief.  She recently had a CT scan of the chest, abdomen, and pelvis, but the results were not available at the time of the visit. She is concerned about not having a brain CT scan, but she has not experienced symptoms like new headaches or double vision that would typically warrant such a scan.  She experienced a high fever of 103.68F, initially attributed to Kadcyla , but later discovered to be related to a sinus infection. She was hospitalized due to the fever and sinus infection. Blood cultures taken from her port showed growth, but repeat cultures from her vein did not.  She is up to date with her echocardiogram. No new headaches or double vision. Diamond Marshall   MEDICAL HISTORY:  Past Medical History:  Diagnosis Date   Abdominal hernia    Anemia    b12 deficiency   Anxiety    Arthritis    B12 deficiency    Back pain    Cancer (HCC)    breast cancer   Depression    Family history of adverse reaction to anesthesia    mother vomits after anesthesia   Gallbladder problem    GERD (gastroesophageal reflux disease)    History of hiatal hernia    small per patient   Hypertension    Hypothyroidism    Joint pain    Knee pain    Palpitation    when iron is low   Pernicious anemia    SOB (shortness of breath)    Thyroid  disease    hypothyroidism   Vitamin D  deficiency     SURGICAL HISTORY: Past Surgical History:  Procedure Laterality Date   BREAST BIOPSY Left 06/09/2022   US  LT BREAST BX W LOC DEV 1ST LESION IMG BX SPEC US  GUIDE 06/09/2022 [facility] MAMMO[facility]APHY   CESAREAN SECTION  1999   Dilatation and currettagement      for miscarriage 18 years ago   PORTACATH PLACEMENT Right 07/05/2022   Procedure: INSERTION PORT-A-CATH;  Surgeon: [name], MD;  Location: [facility] OR;  Service: General;  Laterality: Right;   SIMPLE MASTECTOMY WITH AXILLARY SENTINEL NODE BIOPSY Bilateral 07/26/2023   Procedure: LEFT SKIN REDUCING MASTECTOMY, RIGHT SKIN SPARING, RISK REDUCING MASTECTOMY;  Surgeon: [name], MD;  Location: [facility] OR;  Service: General;  Laterality: Bilateral;  PEC BLOCK    SOCIAL HISTORY: Social History   Socioeconomic History   Marital status: Married    Spouse name: Garrel   Number of children: Not on file   Years of education: Not on file   Highest education level: Not on file  Occupational History   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  Tobacco Use   Smoking status: Never   Smokeless tobacco: Never  Vaping Use   Vaping status: Never Used  Substance and Sexual Activity   Alcohol use: No   Drug use: No   Sexual activity: Yes    Birth control/protection: Pill  Other Topics Concern   Not on file  Social History Narrative   Not on file   Social Drivers of Health   Financial Resource Strain: Not on file  Food Insecurity: No Food Insecurity (10/09/2023)   Hunger Vital Sign    Worried About Running Out of Food in the Last Year: Never true    Ran Out of Food in the Last Year: Never true  Transportation Needs: No Transportation Needs (10/09/2023)   PRAPARE - Administrator, Civil Service (Medical): No    Lack of Transportation (Non-Medical): No  Physical Activity: Not on file  Stress: Not on file  Social Connections: Moderately Integrated (07/26/2023)   Social Connection and Isolation Panel    Frequency of Communication with Friends and Family: Three times a week    Frequency of Social Gatherings with Friends and Family: Three times a week    Attends Religious Services: More than 4 times per year    Active Member of Clubs or Organizations: No    Attends Banker  Meetings: Never    Marital Status: Married  Catering manager Violence: Not At Risk (10/09/2023)   Humiliation, Afraid, Rape, and Kick questionnaire    Fear of Current or Ex-Partner: No    Emotionally Abused: No    Physically Abused: No    Sexually Abused: No    FAMILY HISTORY: Family History  Problem Relation Age of Onset   Hypertension Mother    Diabetes Mother    High Cholesterol Mother    Thyroid  disease Mother    Depression Mother    Anxiety disorder Mother    Obesity Mother    CVA Father        hemorrhagic   Ovarian cancer Maternal Aunt 31   Colon cancer Maternal Aunt    Breast cancer Cousin 44       maternal first cousin, reports negative genetic testing    ALLERGIES:  is allergic to phentermine , bee venom, lactose intolerance (gi), peanut allergen powder-dnfp, topiramate , sulfa antibiotics, tape, and wound dressing adhesive.  MEDICATIONS:  Current Outpatient Medications  Medication Sig Dispense Refill   acetaminophen  (TYLENOL ) 325 MG tablet Take 2 tablets (650 mg total) by mouth every 6 (six) hours as needed for mild pain (pain score 1-3) (or Fever >/= 101). 60 tablet 0   cyanocobalamin  (VITAMIN B12) 1000 MCG/ML injection INJECT 1 ML INTRAMUSCULARLY EVERY 15 DAYS (Patient taking differently: Inject 1,000 mcg into the skin every 30 (thirty) days.) 30 mL 3   EPINEPHrine  0.3 mg/0.3 mL IJ SOAJ injection Inject 0.3 mg into the muscle as needed for anaphylaxis. 2 each 0   ergocalciferol  (VITAMIN D2) 1.25 MG (50000 UT) capsule Take 1 capsule (50,000 Units total) by mouth once a week. 12 capsule 1   escitalopram  (LEXAPRO ) 20 MG tablet Take 1 tablet (20 mg total) by mouth daily. Please take citalopram 10 mg( half pill) only for next 3 days starting from tomorrow.  Then stop it until you finish the course of linezolid .  Can resume previous dose after  2 days of finishing linezolid      LORazepam  (ATIVAN ) 0.5 MG tablet Take 1 tablet (0.5 mg total) by mouth every 8 (eight) hours as  needed for anxiety. 30 tablet 1   oxyCODONE  (OXY IR/ROXICODONE ) 5 MG immediate release tablet Take 1 tablet (5 mg total) by mouth every 6 (six) hours as needed for severe pain (pain score 7-10). 15 tablet 0   pantoprazole  (PROTONIX ) 40 MG tablet Take 1 tablet (40 mg total) by mouth daily. 90  tablet 3   potassium chloride  (KLOR-CON ) 10 MEQ tablet TAKE TWO TABLETS DAILY AS DIRECTED 60 tablet 0   pregabalin  (LYRICA ) 50 MG capsule Take 1 capsule (50 mg total) by mouth 2 (two) times daily. 60 capsule 0   carvedilol  (COREG ) 3.125 MG tablet TAKE ONE TABLET TWICE DAILY WITH MEAL(S) 60 tablet 0   cycloSPORINE  (RESTASIS ) 0.05 % ophthalmic emulsion Place 1 drop into both eyes 2 (two) times daily. 5.5 mL 1   No current facility-administered medications for this visit.    REVIEW OF SYSTEMS:   Constitutional: (+ ) fevers, ( + )  chills , ( - ) night sweats Eyes: ( - ) blurriness of vision, ( - ) double vision, ( - ) watery eyes Ears, nose, mouth, throat, and face: ( - ) mucositis, ( - ) sore throat Respiratory: ( - ) cough, ( - ) dyspnea, ( - ) wheezes Cardiovascular: ( - ) palpitation, ( - ) chest discomfort, ( - ) lower extremity swelling Gastrointestinal:  ( + ) nausea, ( - ) heartburn, ( - ) change in bowel habits Skin: ( - ) abnormal skin rashes Lymphatics: ( - ) new lymphadenopathy, ( - ) easy bruising Neurological: ( - ) numbness, ( - ) tingling, ( - ) new weaknesses Behavioral/Psych: ( - ) mood change, ( - ) new changes  All other systems were reviewed with the patient and are negative.  PHYSICAL EXAMINATION: ECOG PERFORMANCE STATUS: 1 - Symptomatic but completely ambulatory  Vitals:   01/15/24 1058  BP: (!) 154/80  Pulse: 80  Resp: 18  Temp: 98.7 F (37.1 C)  SpO2: 96%   Filed Weights   01/15/24 1058  Weight: 269 lb 11.2 oz (122.3 kg)    GENERAL: well appearing female in NAD  SKIN: skin color, texture, turgor are normal, no rashes or significant lesions EYES: conjunctiva are  pink and non-injected, sclera clear OROPHARYNX: no exudate, no erythema; lips, buccal mucosa, and tongue normal  NECK: supple, non-tender LUNGS: clear to auscultation and percussion with normal breathing effort HEART: regular rate & rhythm and no murmurs and no lower extremity edema Musculoskeletal: no cyanosis of digits and no clubbing  PSYCH: alert & oriented x 3, fluent speech NEURO: no focal motor/sensory deficits  LABORATORY DATA:  I have reviewed the data as listed    Latest Ref Rng & Units 01/15/2024   10:43 AM 12/30/2023   11:17 PM 12/28/2023    4:24 PM  CBC  WBC 4.0 - 10.5 K/uL 6.0  4.9  5.0   Hemoglobin 12.0 - 15.0 g/dL 8.7  8.1  8.6   Hematocrit 36.0 - 46.0 % 28.2  27.2  28.5   Platelets 150 - 400 K/uL 220  166  177        Latest Ref Rng & Units 12/30/2023    8:50 PM 12/28/2023    4:24 PM 12/25/2023   10:54 AM  CMP  Glucose 70 - 99 mg/dL 96  889  897   BUN 6 - 20 mg/dL 9  11  9    Creatinine 0.44 - 1.00 mg/dL 9.26  9.26  9.41   Sodium 135 - 145 mmol/L 138  134  138   Potassium 3.5 - 5.1 mmol/L 3.2  3.4  3.6   Chloride 98 - 111 mmol/L 103  101  105   CO2 22 - 32 mmol/L 24  24  28    Calcium 8.9 - 10.3 mg/dL 8.7  8.6  8.8  Total Protein 6.5 - 8.1 g/dL 7.5  7.7  7.3   Total Bilirubin 0.0 - 1.2 mg/dL 1.0  0.7  0.8   Alkaline Phos 38 - 126 U/L 132  134  128   AST 15 - 41 U/L 82  63  34   ALT 0 - 44 U/L 23  22  17     RADIOGRAPHIC STUDIES: I have personally reviewed the radiological images as listed and agreed with the findings in the report. DG Chest 2 View Result Date: 12/28/2023 CLINICAL DATA:  Fever. EXAM: CHEST - 2 VIEW COMPARISON:  Oct 05, 2023. FINDINGS: The heart size and mediastinal contours are within normal limits. Right internal jugular Port-A-Cath is unchanged. Both lungs are clear. The visualized skeletal structures are unremarkable. IMPRESSION: No active cardiopulmonary disease. Electronically Signed   By: Lynwood Landy Raddle M.D.   On: 12/28/2023 16:51      ASSESSMENT & PLAN Diamond Marshall is a 53 y.o. female who presents for a [facility] visit for evaluation of fever.   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 Fever attributed to sinus infection, neuropathy is likely carpal tunnel syndrome. Treatment well-tolerated, no dose reduction needed. Long-term use planned if effective and tolerable. - Continue trastuzumab  emtansine (Kadcyla ) therapy. - Schedule additional treatments. - Ensure echocardiogram is up to date.  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 Symptoms atypical for chemotherapy-induced neuropathy. Keyboard use may contribute to carpal tunnel syndrome. Managed with Lyrica . - Refer to orthopedics for evaluation and possible steroid injections for carpal tunnel syndrome. - Continue Lyrica  for neuropathy management.  Recent acute sinus infection Sinus infection with high fever. Fever initially thought related to Kadcyla  therapy.  Follow-Up Awaiting official CT scan report for further treatment decisions. - Wait for official CT scan report before making further decisions. - Schedule additional treatments.    Orders Placed This Encounter  Procedures   CBC with Differential (Cancer Center Only)    Standing Status:   Future    Expected Date:   02/05/2024    Expiration Date:   02/04/2025   CMP (Cancer Center only)    Standing Status:   Future    Expected Date:   02/05/2024    Expiration Date:   02/04/2025   CBC with Differential (Cancer Center Only)    Standing Status:   Future    Expected Date:   02/26/2024    Expiration Date:   02/25/2025   CMP (Cancer Center only)    Standing Status:   Future    Expected Date:   02/26/2024     Expiration Date:   02/25/2025   CBC with Differential (Cancer Center Only)    Standing Status:   Future    Expected Date:   03/18/2024    Expiration Date:   03/18/2025   CMP (Cancer Center only)    Standing Status:   Future    Expected Date:   03/18/2024    Expiration Date:   03/18/2025    All questions were answered. The patient knows to call the clinic with any problems, questions or concerns.  I have spent a total of 30 minutes minutes of face-to-face and non-face-to-face time, preparing to see the patient,  performing a medically appropriate examination, counseling and educating the patient, ordering medications/tests/procedures,documenting clinical information in  the electronic health record, independently interpreting results and communicating results to the patient, and care coordination.   Amber Stalls MD

## 2024-01-15 NOTE — Progress Notes (Signed)
 Pt observed for 30 minutes post Kadcyla infusion. Pt tolerated Tx well w/out incident. VSS at discharge.  Ambulatory to lobby.

## 2024-01-15 NOTE — Progress Notes (Signed)
 Recent ED visit for fever - okay to proceed with same reduced Kadcyla  dose as last cycle (3 mg/kg) per Dr. Loretha.  Harlene Nasuti, PharmD Oncology Infusion Pharmacist 01/15/2024 11:56 AM

## 2024-01-15 NOTE — Patient Instructions (Signed)
 [facility] - A DEPT OF [facility]  Discharge Instructions: Thank you for choosing [facility] to provide your oncology and hematology care.   If you have a lab appointment with the Cancer Center, please go directly to the Cancer Center and check in at the registration area.   Wear comfortable clothing and clothing appropriate for easy access to any Portacath or PICC line.   We strive to give you quality time with your provider. You may need to reschedule your appointment if you arrive late (15 or more minutes).  Arriving late affects you and other patients whose appointments are after yours.  Also, if you miss three or more appointments without notifying the office, you may be dismissed from the clinic at the provider's discretion.      For prescription refill requests, have your pharmacy contact our office and allow 72 hours for refills to be completed.    Today you received the following chemotherapy and/or immunotherapy agents: Kadcyla .       To help prevent nausea and vomiting after your treatment, we encourage you to take your nausea medication as directed.  BELOW ARE SYMPTOMS THAT SHOULD BE REPORTED IMMEDIATELY: *FEVER GREATER THAN 100.4 F (38 C) OR HIGHER *CHILLS OR SWEATING *NAUSEA AND VOMITING THAT IS NOT CONTROLLED WITH YOUR NAUSEA MEDICATION *UNUSUAL SHORTNESS OF BREATH *UNUSUAL BRUISING OR BLEEDING *URINARY PROBLEMS (pain or burning when urinating, or frequent urination) *BOWEL PROBLEMS (unusual diarrhea, constipation, pain near the anus) TENDERNESS IN MOUTH AND THROAT WITH OR WITHOUT PRESENCE OF ULCERS (sore throat, sores in mouth, or a toothache) UNUSUAL RASH, SWELLING OR PAIN  UNUSUAL VAGINAL DISCHARGE OR ITCHING   Items with * indicate a potential emergency and should be followed up as soon as possible or go to the Emergency Department if any problems should occur.  Please show the CHEMOTHERAPY ALERT CARD or IMMUNOTHERAPY  ALERT CARD at check-in to the Emergency Department and triage nurse.  Should you have questions after your visit or need to cancel or reschedule your appointment, please contact [facility] - A DEPT OF [facility]  Dept: [phone]  and follow the prompts.  Office hours are 8:00 a.m. to 4:30 p.m. Monday - Friday. Please note that voicemails left after 4:00 p.m. may not be returned until the following business day.  We are closed weekends and major holidays. You have access to a nurse at all times for urgent questions. Please call the main number to the clinic Dept: [phone] and follow the prompts.   For any non-urgent questions, you may also contact your provider using MyChart. We now offer e-Visits for anyone 98 and older to request care online for non-urgent symptoms. For details visit [email or web address].   Also download the MyChart app! Go to the app store, search MyChart, open the app, select [facility], and log in with your MyChart username and password.

## 2024-01-24 ENCOUNTER — Other Ambulatory Visit: Payer: Self-pay | Admitting: Hematology and Oncology

## 2024-01-24 DIAGNOSIS — C50212 Malignant neoplasm of upper-inner quadrant of left female breast: Secondary | ICD-10-CM

## 2024-01-30 ENCOUNTER — Telehealth: Payer: Self-pay | Admitting: Hematology and Oncology

## 2024-01-30 ENCOUNTER — Other Ambulatory Visit: Payer: Self-pay | Admitting: Hematology and Oncology

## 2024-01-30 DIAGNOSIS — E876 Hypokalemia: Secondary | ICD-10-CM

## 2024-01-30 NOTE — Telephone Encounter (Signed)
 Scheduled appointments per WQ. Talked with the patient and she is aware of the made appointments.

## 2024-01-31 ENCOUNTER — Other Ambulatory Visit: Payer: Self-pay

## 2024-02-01 ENCOUNTER — Ambulatory Visit (HOSPITAL_COMMUNITY)
Admission: RE | Admit: 2024-02-01 | Discharge: 2024-02-01 | Disposition: A | Discharge status: Home / Self Care | Source: Ambulatory Visit | Attending: Hematology and Oncology | Admitting: Hematology and Oncology

## 2024-02-01 DIAGNOSIS — C50212 Malignant neoplasm of upper-inner quadrant of left female breast: Secondary | ICD-10-CM | POA: Insufficient documentation

## 2024-02-01 DIAGNOSIS — Z170 Estrogen receptor positive status [ER+]: Secondary | ICD-10-CM | POA: Insufficient documentation

## 2024-02-04 ENCOUNTER — Other Ambulatory Visit: Payer: Self-pay | Admitting: Hematology and Oncology

## 2024-02-04 NOTE — Progress Notes (Unsigned)
   Diamond [name], PhD, LAT, ATC acting as a scribe for Artist Lloyd, MD.  Diamond Marshall is a 53 y.o. female who presents to [facility] at [facility] today for bilat wrist pain x ***. Pt locates pain to ***  Of note, she is currently being treated w/ chemotherapy for L breast malignant neoplasm.  Radiates: Paresthesia: Grip strength: Aggravates: Treatments tried:  Pertinent review of systems: ***  Relevant historical information: ***   Exam:  LMP 10/13/2019 (Approximate)  General: Well Developed, well nourished, and in no acute distress.   MSK: ***    Lab and Radiology Results No results found for this or any previous visit (from the past 72 hours). No results found.     Assessment and Plan: 53 y.o. female with ***   PDMP not reviewed this encounter. No orders of the defined types were placed in this encounter.  No orders of the defined types were placed in this encounter.    Discussed warning signs or symptoms. Please see discharge instructions. Patient expresses understanding.   ***

## 2024-02-05 ENCOUNTER — Inpatient Hospital Stay: Attending: Hematology and Oncology

## 2024-02-05 ENCOUNTER — Encounter: Payer: Self-pay | Admitting: Family Medicine

## 2024-02-05 ENCOUNTER — Ambulatory Visit: Payer: Self-pay

## 2024-02-05 ENCOUNTER — Ambulatory Visit: Admitting: Family Medicine

## 2024-02-05 ENCOUNTER — Inpatient Hospital Stay (HOSPITAL_BASED_OUTPATIENT_CLINIC_OR_DEPARTMENT_OTHER): Admitting: Hematology and Oncology

## 2024-02-05 ENCOUNTER — Inpatient Hospital Stay

## 2024-02-05 VITALS — BP 126/84 | HR 83 | Ht 61.0 in | Wt 268.0 lb

## 2024-02-05 VITALS — BP 139/51 | HR 87 | Temp 98.4°F | Resp 18 | Wt 268.3 lb

## 2024-02-05 DIAGNOSIS — Z170 Estrogen receptor positive status [ER+]: Secondary | ICD-10-CM

## 2024-02-05 DIAGNOSIS — Z803 Family history of malignant neoplasm of breast: Secondary | ICD-10-CM | POA: Insufficient documentation

## 2024-02-05 DIAGNOSIS — Z79899 Other long term (current) drug therapy: Secondary | ICD-10-CM | POA: Insufficient documentation

## 2024-02-05 DIAGNOSIS — R509 Fever, unspecified: Secondary | ICD-10-CM | POA: Diagnosis not present

## 2024-02-05 DIAGNOSIS — Z1731 Human epidermal growth factor receptor 2 positive status: Secondary | ICD-10-CM | POA: Insufficient documentation

## 2024-02-05 DIAGNOSIS — C50212 Malignant neoplasm of upper-inner quadrant of left female breast: Secondary | ICD-10-CM

## 2024-02-05 DIAGNOSIS — G5623 Lesion of ulnar nerve, bilateral upper limbs: Secondary | ICD-10-CM

## 2024-02-05 DIAGNOSIS — Z1721 Progesterone receptor positive status: Secondary | ICD-10-CM | POA: Insufficient documentation

## 2024-02-05 DIAGNOSIS — Z8041 Family history of malignant neoplasm of ovary: Secondary | ICD-10-CM | POA: Insufficient documentation

## 2024-02-05 DIAGNOSIS — Z5112 Encounter for antineoplastic immunotherapy: Secondary | ICD-10-CM | POA: Diagnosis present

## 2024-02-05 DIAGNOSIS — G629 Polyneuropathy, unspecified: Secondary | ICD-10-CM | POA: Diagnosis not present

## 2024-02-05 LAB — CBC WITH DIFFERENTIAL (CANCER CENTER ONLY)
Abs Immature Granulocytes: 0.03 K/uL (ref 0.00–0.07)
Basophils Absolute: 0.1 K/uL (ref 0.0–0.1)
Basophils Relative: 1 %
Eosinophils Absolute: 0.1 K/uL (ref 0.0–0.5)
Eosinophils Relative: 2 %
HCT: 27.9 % — ABNORMAL LOW (ref 36.0–46.0)
Hemoglobin: 8.6 g/dL — ABNORMAL LOW (ref 12.0–15.0)
Immature Granulocytes: 1 %
Lymphocytes Relative: 23 %
Lymphs Abs: 1.3 K/uL (ref 0.7–4.0)
MCH: 22.2 pg — ABNORMAL LOW (ref 26.0–34.0)
MCHC: 30.8 g/dL (ref 30.0–36.0)
MCV: 71.9 fL — ABNORMAL LOW (ref 80.0–100.0)
Monocytes Absolute: 0.3 K/uL (ref 0.1–1.0)
Monocytes Relative: 6 %
Neutro Abs: 3.8 K/uL (ref 1.7–7.7)
Neutrophils Relative %: 67 %
Platelet Count: 179 K/uL (ref 150–400)
RBC: 3.88 MIL/uL (ref 3.87–5.11)
RDW: 20.5 % — ABNORMAL HIGH (ref 11.5–15.5)
WBC Count: 5.6 K/uL (ref 4.0–10.5)
nRBC: 0 % (ref 0.0–0.2)

## 2024-02-05 LAB — CMP (CANCER CENTER ONLY)
ALT: 14 U/L (ref 0–44)
AST: 29 U/L (ref 15–41)
Albumin: 3.4 g/dL — ABNORMAL LOW (ref 3.5–5.0)
Alkaline Phosphatase: 140 U/L — ABNORMAL HIGH (ref 38–126)
Anion gap: 5 (ref 5–15)
BUN: 8 mg/dL (ref 6–20)
CO2: 26 mmol/L (ref 22–32)
Calcium: 8.6 mg/dL — ABNORMAL LOW (ref 8.9–10.3)
Chloride: 107 mmol/L (ref 98–111)
Creatinine: 0.64 mg/dL (ref 0.44–1.00)
GFR, Estimated: >60 mL/min (ref >60)
Glucose, Bld: 152 mg/dL — ABNORMAL HIGH (ref 70–99)
Potassium: 3.6 mmol/L (ref 3.5–5.1)
Sodium: 138 mmol/L (ref 135–145)
Total Bilirubin: 0.7 mg/dL (ref 0.0–1.2)
Total Protein: 7.7 g/dL (ref 6.5–8.1)

## 2024-02-05 MED ORDER — PROCHLORPERAZINE MALEATE 10 MG PO TABS
10.0000 mg | ORAL_TABLET | Freq: Once | ORAL | Status: AC
  Administered 2024-02-05: 10 mg via ORAL
  Filled 2024-02-05: qty 1

## 2024-02-05 MED ORDER — SODIUM CHLORIDE 0.9% FLUSH: 10.0000 mL | INTRAVENOUS | Status: DC | PRN

## 2024-02-05 MED ORDER — SODIUM CHLORIDE 0.9 % IV SOLN
3.0000 mg/kg | Freq: Once | INTRAVENOUS | Status: AC
  Administered 2024-02-05: 400 mg via INTRAVENOUS
  Filled 2024-02-05: qty 20

## 2024-02-05 MED ORDER — SODIUM CHLORIDE 0.9 % IV SOLN: INTRAVENOUS | Status: DC

## 2024-02-05 MED ORDER — METHYLPREDNISOLONE 4 MG PO TBPK: ORAL_TABLET | ORAL | 0 refills | Status: AC

## 2024-02-05 MED ORDER — LORATADINE 10 MG PO TABS
10.0000 mg | ORAL_TABLET | Freq: Once | ORAL | Status: AC
  Administered 2024-02-05: 10 mg via ORAL
  Filled 2024-02-05: qty 1

## 2024-02-05 MED ORDER — ACETAMINOPHEN 325 MG PO TABS
650.0000 mg | ORAL_TABLET | Freq: Once | ORAL | Status: AC
  Administered 2024-02-05: 650 mg via ORAL
  Filled 2024-02-05: qty 2

## 2024-02-05 NOTE — Patient Instructions (Signed)
 [facility] - A DEPT OF [facility]  Discharge Instructions: Thank you for choosing [facility] to provide your oncology and hematology care.   If you have a lab appointment with the Cancer Center, please go directly to the Cancer Center and check in at the registration area.   Wear comfortable clothing and clothing appropriate for easy access to any Portacath or PICC line.   We strive to give you quality time with your provider. You may need to reschedule your appointment if you arrive late (15 or more minutes).  Arriving late affects you and other patients whose appointments are after yours.  Also, if you miss three or more appointments without notifying the office, you may be dismissed from the clinic at the provider's discretion.      For prescription refill requests, have your pharmacy contact our office and allow 72 hours for refills to be completed.    Today you received the following chemotherapy and/or immunotherapy agent: ado-Trastuzumab  (Kadcyla )   To help prevent nausea and vomiting after your treatment, we encourage you to take your nausea medication as directed.  BELOW ARE SYMPTOMS THAT SHOULD BE REPORTED IMMEDIATELY: *FEVER GREATER THAN 100.4 F (38 C) OR HIGHER *CHILLS OR SWEATING *NAUSEA AND VOMITING THAT IS NOT CONTROLLED WITH YOUR NAUSEA MEDICATION *UNUSUAL SHORTNESS OF BREATH *UNUSUAL BRUISING OR BLEEDING *URINARY PROBLEMS (pain or burning when urinating, or frequent urination) *BOWEL PROBLEMS (unusual diarrhea, constipation, pain near the anus) TENDERNESS IN MOUTH AND THROAT WITH OR WITHOUT PRESENCE OF ULCERS (sore throat, sores in mouth, or a toothache) UNUSUAL RASH, SWELLING OR PAIN  UNUSUAL VAGINAL DISCHARGE OR ITCHING   Items with * indicate a potential emergency and should be followed up as soon as possible or go to the Emergency Department if any problems should occur.  Please show the CHEMOTHERAPY ALERT CARD or  IMMUNOTHERAPY ALERT CARD at check-in to the Emergency Department and triage nurse.  Should you have questions after your visit or need to cancel or reschedule your appointment, please contact [facility] - A DEPT OF [facility]  Dept: [phone]  and follow the prompts.  Office hours are 8:00 a.m. to 4:30 p.m. Monday - Friday. Please note that voicemails left after 4:00 p.m. may not be returned until the following business day.  We are closed weekends and major holidays. You have access to a nurse at all times for urgent questions. Please call the main number to the clinic Dept: [phone] and follow the prompts.   For any non-urgent questions, you may also contact your provider using MyChart. We now offer e-Visits for anyone 70 and older to request care online for non-urgent symptoms. For details visit [email or web address].   Also download the MyChart app! Go to the app store, search MyChart, open the app, select [facility], and log in with your MyChart username and password.

## 2024-02-05 NOTE — Progress Notes (Signed)
 [facility] Telephone:([phone]   [phone]  PROGRESS NOTE  Patient Care Team: Zollie Lowers, MD as PCP - General (Family Medicine) [name], MD as Consulting Physician (General Surgery) [name], MD as Consulting Physician (Hematology and Oncology) Izell Domino, MD as Attending Physician (Radiation Oncology)   CHIEF COMPLAINTS/PURPOSE OF CONSULTATION:  FU before next cycle of kadcyla .  Oncology History  Malignant neoplasm of upper-inner quadrant of left breast in female, estrogen receptor positive (HCC)  06/06/2022 Mammogram   Diagnostic mammogram given palpable mass showed suspicious spiculated left breast mass with surrounding nodularity at 9 o'clock position.  This measures up to 3.1 cm sonographically however due to deep location and patient body habitus the mammographic measurement of 5.8 cm is felt to be more accurate.  At least 2 abnormal lymph nodes in the left axilla.  Possible abnormal palpable left supraclavicular lymph node.   06/06/2022 Breast US    Breast ultrasound confirmed these findings.   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  Prognostic showed ER 65% weak to strong staining, PR 15% weak to moderate staining, Ki-67 of 50% and HER2 positive by IHC at 3+    Genetic Testing   [facility] Custom Panel+RNA was Negative. Report date is 06/28/2022.   The Custom Hereditary Cancers Panel offered by [facility] includes sequencing and/or deletion duplication testing of the following 43 genes: APC, ATM, AXIN2, BAP1, BARD1, BMPR1A, BRCA1, BRCA2, BRIP1, CDH1, CDK4, CDKN2A (p14ARF and p16INK4a only), CHEK2, CTNNA1, EPCAM (Deletion/duplication testing only), FH, GREM1 (promoter region duplication testing only), HOXB13, KIT, MBD4, MEN1, MLH1, MSH2, MSH3, MSH6, MUTYH, NF1, NHTL1,  PALB2, PDGFRA, PMS2, POLD1, POLE, PTEN, RAD51C, RAD51D, SMAD4, SMARCA4. STK11, TP53, TSC1, TSC2, and VHL.    06/21/2022 Cancer Staging   Staging form: Breast, AJCC 8th Edition - Clinical stage from 06/21/2022: Stage IV (cT3, cN3, cM1, G3, ER+, PR+, HER2+) - Signed by [name], MD on 09/17/2023 Stage prefix: Initial diagnosis Histologic grading system: 3 grade system Stage used in treatment planning: Yes National guidelines used in treatment planning: Yes Type of national guideline used in treatment planning: NCCN   06/29/2022 PET scan   IMPRESSION: 1. Hypermetabolic left breast mass, compatible with primary breast malignancy. 2. Hypermetabolic left cervical, axillary, subpectoral, supraclavicular and prevascular mediastinal lymph nodes, compatible with nodal metastatic disease. 3. No evidence of metastatic disease in the abdomen or pelvis. 4. Aortic Atherosclerosis (ICD10-I70.0).     Electronically Signed   By: [name] M.D.   On: 06/29/2022 09:32   07/11/2022 - 01/16/2023 Chemotherapy   Patient is on Treatment Plan : BREAST DOCEtaxel  + Trastuzumab  + Pertuzumab  (THP) q21d x 8 cycles / Trastuzumab  + Pertuzumab  q21d x 4 cycles     02/01/2023 - 06/29/2023 Chemotherapy   Patient is on Treatment Plan : BREAST METASTATIC Fam-Trastuzumab Deruxtecan-nxki  (Enhertu ) (5.4) q21d     10/02/2023 -  Chemotherapy   Patient is on Treatment Plan : BREAST ADO-Trastuzumab Emtansine  (Kadcyla ) q21d        HISTORY OF PRESENTING ILLNESS:  Diamond Marshall 53 y.o. female who is under the care of Dr. Loretha for breast cancer. She is currently receiving Kadcyla  every 21 days.  Discussed the use of AI scribe software for clinical note transcription with the patient, who gave verbal consent to proceed.  History of Present Illness Diamond Marshall is  a 53 year old female who presents for follow-up of her chest CT results.  She underwent a CT chest on February 01, 2024, following a previous scan on  January 21, 2024, which showed some small spots. These were initially thought to be related to a bad cold or atypical infection. She reports improvement in her symptoms, attributing them to allergies, with minimal coughing and no shortness of breath. No worsening of cough, shortness of breath, chest pain, fevers, or chills. A spot on her chest has healed. She did not require hospitalization last month.  She experiences tingling in her fingers, which is not worsening. She received shots in her elbow earlier today. She reports that Lyrica  seemed to help with the tingling, and she currently has no pain.  She is looking forward to a family trip to [facility] in [place]  next month.   MEDICAL HISTORY:  Past Medical History:  Diagnosis Date   Abdominal hernia    Anemia    b12 deficiency   Anxiety    Arthritis    B12 deficiency    Back pain    Cancer (HCC)    breast cancer   Depression    Family history of adverse reaction to anesthesia    mother vomits after anesthesia   Gallbladder problem    GERD (gastroesophageal reflux disease)    History of hiatal hernia    small per patient   Hypertension    Hypothyroidism    Joint pain    Knee pain    Palpitation    when iron is low   Pernicious anemia    SOB (shortness of breath)    Thyroid  disease    hypothyroidism   Vitamin D  deficiency     SURGICAL HISTORY: Past Surgical History:  Procedure Laterality Date   BREAST BIOPSY Left 06/09/2022   US  LT BREAST BX W LOC DEV 1ST LESION IMG BX SPEC US  GUIDE 06/09/2022 [facility] MAMMOGRAPHY   CESAREAN SECTION  1999   Dilatation and currettagement     for miscarriage 18 years ago   PORTACATH PLACEMENT Right 07/05/2022   Procedure: INSERTION PORT-A-CATH;  Surgeon: [name], MD;  Location: [facility] OR;  Service: General;  Laterality: Right;   SIMPLE MASTECTOMY WITH AXILLARY SENTINEL NODE BIOPSY Bilateral 07/26/2023   Procedure: LEFT SKIN REDUCING MASTECTOMY, RIGHT SKIN SPARING, RISK REDUCING  MASTECTOMY;  Surgeon: [name], MD;  Location: [facility] OR;  Service: General;  Laterality: Bilateral;  PEC BLOCK    SOCIAL HISTORY: Social History   Socioeconomic History   Marital status: Married    Spouse name: Garrel   Number of children: Not on file   Years of education: Not on file   Highest education level: Not on file  Occupational History   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  Tobacco Use   Smoking status: Never   Smokeless tobacco: Never  Vaping Use   Vaping status: Never Used  Substance and Sexual Activity   Alcohol use: No   Drug use: No   Sexual activity: Yes    Birth control/protection: Pill  Other Topics Concern   Not on file  Social History Narrative   Not on file   Social Drivers of Health   Financial Resource Strain: Not on file  Food Insecurity: No Food Insecurity (10/09/2023)   Hunger Vital Sign    Worried About Running Out of Food in the Last Year: Never true    Ran Out of Food in the Last Year: Never true  Transportation Needs: No  Transportation Needs (10/09/2023)   PRAPARE - Administrator, Civil Service (Medical): No    Lack of Transportation (Non-Medical): No  Physical Activity: Not on file  Stress: Not on file  Social Connections: Moderately Integrated (07/26/2023)   Social Connection and Isolation Panel    Frequency of Communication with Friends and Family: Three times a week    Frequency of Social Gatherings with Friends and Family: Three times a week    Attends Religious Services: More than 4 times per year    Active Member of Clubs or Organizations: No    Attends Banker Meetings: Never    Marital Status: Married  Catering manager Violence: Not At Risk (10/09/2023)   Humiliation, Afraid, Rape, and Kick questionnaire    Fear of Current or Ex-Partner: No    Emotionally Abused: No    Physically Abused: No    Sexually Abused: No    FAMILY HISTORY: Family History  Problem Relation Age of Onset   Hypertension  Mother    Diabetes Mother    High Cholesterol Mother    Thyroid  disease Mother    Depression Mother    Anxiety disorder Mother    Obesity Mother    CVA Father        hemorrhagic   Ovarian cancer Maternal Aunt 75   Colon cancer Maternal Aunt    Breast cancer Cousin 66       maternal first cousin, reports negative genetic testing    ALLERGIES:  is allergic to phentermine , bee venom, lactose intolerance (gi), peanut allergen powder-dnfp, topiramate , sulfa antibiotics, tape, and wound dressing adhesive.  MEDICATIONS:  Current Outpatient Medications  Medication Sig Dispense Refill   acetaminophen  (TYLENOL ) 325 MG tablet Take 2 tablets (650 mg total) by mouth every 6 (six) hours as needed for mild pain (pain score 1-3) (or Fever >/= 101). 60 tablet 0   carvedilol  (COREG ) 3.125 MG tablet TAKE ONE TABLET TWICE DAILY WITH MEAL(S) 60 tablet 0   cyanocobalamin  (VITAMIN B12) 1000 MCG/ML injection INJECT 1 ML INTRAMUSCULARLY EVERY 15 DAYS (Patient taking differently: Inject 1,000 mcg into the skin every 30 (thirty) days.) 30 mL 3   cycloSPORINE  (RESTASIS ) 0.05 % ophthalmic emulsion Place 1 drop into both eyes 2 (two) times daily. 5.5 mL 1   EPINEPHrine  0.3 mg/0.3 mL IJ SOAJ injection Inject 0.3 mg into the muscle as needed for anaphylaxis. 2 each 0   ergocalciferol  (VITAMIN D2) 1.25 MG (50000 UT) capsule Take 1 capsule (50,000 Units total) by mouth once a week. 12 capsule 1   escitalopram  (LEXAPRO ) 20 MG tablet Take 1 tablet (20 mg total) by mouth daily. Please take citalopram 10 mg( half pill) only for next 3 days starting from tomorrow.  Then stop it until you finish the course of linezolid .  Can resume previous dose after  2 days of finishing linezolid      LORazepam  (ATIVAN ) 0.5 MG tablet Take 1 tablet (0.5 mg total) by mouth every 8 (eight) hours as needed for anxiety. 30 tablet 1   oxyCODONE  (OXY IR/ROXICODONE ) 5 MG immediate release tablet Take 1 tablet (5 mg total) by mouth every 6 (six) hours  as needed for severe pain (pain score 7-10). 15 tablet 0   pantoprazole  (PROTONIX ) 40 MG tablet Take 1 tablet (40 mg total) by mouth daily. 90 tablet 3   potassium chloride  (KLOR-CON ) 10 MEQ tablet TAKE TWO TABLETS DAILY AS DIRECTED 60 tablet 0   pregabalin  (LYRICA ) 50 MG capsule TAKE 1  CAPSULE 2 TIMES A DAY (STOP GABAPENTIN ) 60 capsule 0   No current facility-administered medications for this visit.    REVIEW OF SYSTEMS:   Constitutional: (+ ) fevers, ( + )  chills , ( - ) night sweats Eyes: ( - ) blurriness of vision, ( - ) double vision, ( - ) watery eyes Ears, nose, mouth, throat, and face: ( - ) mucositis, ( - ) sore throat Respiratory: ( - ) cough, ( - ) dyspnea, ( - ) wheezes Cardiovascular: ( - ) palpitation, ( - ) chest discomfort, ( - ) lower extremity swelling Gastrointestinal:  ( + ) nausea, ( - ) heartburn, ( - ) change in bowel habits Skin: ( - ) abnormal skin rashes Lymphatics: ( - ) new lymphadenopathy, ( - ) easy bruising Neurological: ( - ) numbness, ( - ) tingling, ( - ) new weaknesses Behavioral/Psych: ( - ) mood change, ( - ) new changes  All other systems were reviewed with the patient and are negative.  PHYSICAL EXAMINATION: ECOG PERFORMANCE STATUS: 1 - Symptomatic but completely ambulatory  Vitals:   02/05/24 1104  BP: (!) 139/51  Pulse: 87  Resp: 18  Temp: 98.4 F (36.9 C)  SpO2: 94%   Filed Weights   02/05/24 1104  Weight: 268 lb 4.8 oz (121.7 kg)    GENERAL: well appearing female in NAD  SKIN: skin color, texture, turgor are normal, no rashes or significant lesions EYES: conjunctiva are pink and non-injected, sclera clear OROPHARYNX: no exudate, no erythema; lips, buccal mucosa, and tongue normal  NECK: supple, non-tender LUNGS: clear to auscultation and percussion with normal breathing effort HEART: regular rate & rhythm and no murmurs and no lower extremity edema Musculoskeletal: no cyanosis of digits and no clubbing  PSYCH: alert & oriented  x 3, fluent speech NEURO: no focal motor/sensory deficits  LABORATORY DATA:  I have reviewed the data as listed    Latest Ref Rng & Units 02/05/2024   10:47 AM 01/15/2024   10:43 AM 12/30/2023   11:17 PM  CBC  WBC 4.0 - 10.5 K/uL 5.6  6.0  4.9   Hemoglobin 12.0 - 15.0 g/dL 8.6  8.7  8.1   Hematocrit 36.0 - 46.0 % 27.9  28.2  27.2   Platelets 150 - 400 K/uL 179  220  166        Latest Ref Rng & Units 01/15/2024   10:43 AM 12/30/2023    8:50 PM 12/28/2023    4:24 PM  CMP  Glucose 70 - 99 mg/dL 90  96  889   BUN 6 - 20 mg/dL 10  9  11    Creatinine 0.44 - 1.00 mg/dL 9.38  9.26  9.26   Sodium 135 - 145 mmol/L 139  138  134   Potassium 3.5 - 5.1 mmol/L 3.7  3.2  3.4   Chloride 98 - 111 mmol/L 106  103  101   CO2 22 - 32 mmol/L 28  24  24    Calcium 8.9 - 10.3 mg/dL 8.9  8.7  8.6   Total Protein 6.5 - 8.1 g/dL 7.3  7.5  7.7   Total Bilirubin 0.0 - 1.2 mg/dL 0.8  1.0  0.7   Alkaline Phos 38 - 126 U/L 122  132  134   AST 15 - 41 U/L 34  82  63   ALT 0 - 44 U/L 15  23  22     RADIOGRAPHIC STUDIES: I have personally reviewed  the radiological images as listed and agreed with the findings in the report. CT CHEST ABDOMEN PELVIS W CONTRAST Result Date: 01/21/2024 CLINICAL DATA:  Breast cancer. EXAM: CT CHEST, ABDOMEN, AND PELVIS WITH CONTRAST TECHNIQUE: Multidetector CT imaging of the chest, abdomen and pelvis was performed following the standard protocol during bolus administration of intravenous contrast. RADIATION DOSE REDUCTION: This exam was performed according to the departmental dose-optimization program which includes automated exposure control, adjustment of the mA and/or kV according to patient size and/or use of iterative reconstruction technique. CONTRAST:  OMNIPAQUE  IOHEXOL  300 MG/ML  SOLN COMPARISON:  10/05/2023. FINDINGS: CT CHEST FINDINGS Cardiovascular: No significant vascular findings. Normal heart size. No pericardial effusion. Mediastinum/Nodes: No enlarged mediastinal,  hilar, or axillary lymph nodes. Thyroid  gland, trachea, and esophagus demonstrate no significant findings. Lungs/Pleura: Patchy multifocal alveolar opacities throughout both lungs likely an atypical infection such as COVID. Follow up to confirm resolution and exclude other etiologies such as metastatic disease. Musculoskeletal: Postop bilateral mastectomy. Thoracic degenerative disc disease. No osteolytic or osteoblastic lesions CT ABDOMEN PELVIS FINDINGS Hepatobiliary: No focal liver abnormality is seen. No gallstones, gallbladder wall thickening, or biliary dilatation. Pancreas: Unremarkable. No pancreatic ductal dilatation or surrounding inflammatory changes. Spleen: Normal in size without focal abnormality. Adrenals/Urinary Tract: Adrenal glands are unremarkable. Kidneys are normal, without renal calculi, focal lesion, or hydronephrosis. Bladder is unremarkable. Stomach/Bowel: Stomach is within normal limits. Appendix appears normal. No evidence of bowel wall thickening, distention, or inflammatory changes. Vascular/Lymphatic: No significant vascular findings are present. No enlarged abdominal or pelvic lymph nodes. Reproductive: Uterus and bilateral adnexa are unremarkable. Other: No abdominal wall hernia or abnormality. No abdominopelvic ascites. Musculoskeletal: No acute or significant osseous findings. IMPRESSION: 1. Patchy alveolar opacities throughout both lungs likely an atypical infection such as COVID. Follow up to confirm resolution and exclude other etiologies such as metastatic disease. 2. No evidence of metastatic disease in the abdomen or pelvis. Electronically Signed   By: Fonda Field M.D.   On: 01/21/2024 10:40     ASSESSMENT & PLAN SUMIKO CEASAR is a 53 y.o. female who presents for a [facility] visit for evaluation of fever.   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 Last imaging showed some patchy alveolar opacities through out both lungs likely atypical infection. Her symptoms have improved. She denies any cough or SOB or fevers or chills. Imaging shows overall stable findings on my read. Radiology read it as stable overall to mild worsening. We will treat her with medrol  dose pak and continue kadcyla  If she does notice any worsening symptoms, she was asked to immediately call and she expressed understanding.  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 - She has noticed some improvement.  Recent acute sinus infection I believe her patchy lung infiltrates are likely related to her recent URI. Symptoms have resolved.   Peripheral neuropathy of upper extremity Improvement with Lyrica . Tingling persists, no pain. Injections given today.    No orders of the defined types were placed in this encounter.   All questions were answered. The patient knows to call the clinic with any problems, questions or concerns.  I have spent a total of 30 minutes minutes of face-to-face and non-face-to-face time, preparing to see the patient,  performing a medically appropriate examination, counseling and educating the patient, ordering medications/tests/procedures,documenting clinical information in the electronic health record,  independently interpreting results and communicating results to the patient, and care coordination.   Amber Stalls MD

## 2024-02-05 NOTE — Patient Instructions (Addendum)
 Thank you for coming in today.   You received an injection today. Seek immediate medical attention if the joint becomes red, extremely painful, or is oozing fluid.   Cubital tunnel elbow brace  Knee immobilizer  If not better, please let me know, and I can order a Nerve Conduction Study

## 2024-02-06 ENCOUNTER — Ambulatory Visit: Admitting: Family Medicine

## 2024-02-07 ENCOUNTER — Encounter: Payer: Self-pay | Admitting: Family Medicine

## 2024-02-07 ENCOUNTER — Other Ambulatory Visit: Payer: Self-pay

## 2024-02-11 ENCOUNTER — Encounter: Payer: Self-pay | Admitting: Hematology and Oncology

## 2024-02-11 ENCOUNTER — Encounter: Payer: Self-pay | Admitting: Family Medicine

## 2024-02-18 NOTE — Telephone Encounter (Signed)
 Forwarding to Dr. Denyse Amass to review and advise.

## 2024-02-21 ENCOUNTER — Encounter: Payer: Self-pay | Admitting: Hematology and Oncology

## 2024-02-26 ENCOUNTER — Inpatient Hospital Stay (HOSPITAL_BASED_OUTPATIENT_CLINIC_OR_DEPARTMENT_OTHER): Admitting: Hematology and Oncology

## 2024-02-26 ENCOUNTER — Encounter: Payer: Self-pay | Admitting: Hematology and Oncology

## 2024-02-26 ENCOUNTER — Inpatient Hospital Stay: Attending: Hematology and Oncology

## 2024-02-26 ENCOUNTER — Inpatient Hospital Stay

## 2024-02-26 VITALS — BP 139/68 | HR 80 | Temp 97.7°F | Resp 16 | Wt 265.9 lb

## 2024-02-26 DIAGNOSIS — Z79899 Other long term (current) drug therapy: Secondary | ICD-10-CM | POA: Insufficient documentation

## 2024-02-26 DIAGNOSIS — Z170 Estrogen receptor positive status [ER+]: Secondary | ICD-10-CM

## 2024-02-26 DIAGNOSIS — C50212 Malignant neoplasm of upper-inner quadrant of left female breast: Secondary | ICD-10-CM | POA: Diagnosis present

## 2024-02-26 DIAGNOSIS — Z8041 Family history of malignant neoplasm of ovary: Secondary | ICD-10-CM | POA: Diagnosis not present

## 2024-02-26 DIAGNOSIS — Z803 Family history of malignant neoplasm of breast: Secondary | ICD-10-CM | POA: Insufficient documentation

## 2024-02-26 DIAGNOSIS — Z452 Encounter for adjustment and management of vascular access device: Secondary | ICD-10-CM | POA: Insufficient documentation

## 2024-02-26 DIAGNOSIS — Z5112 Encounter for antineoplastic immunotherapy: Secondary | ICD-10-CM | POA: Diagnosis present

## 2024-02-26 DIAGNOSIS — G629 Polyneuropathy, unspecified: Secondary | ICD-10-CM | POA: Diagnosis not present

## 2024-02-26 DIAGNOSIS — Z1731 Human epidermal growth factor receptor 2 positive status: Secondary | ICD-10-CM | POA: Diagnosis not present

## 2024-02-26 DIAGNOSIS — Z1721 Progesterone receptor positive status: Secondary | ICD-10-CM | POA: Diagnosis not present

## 2024-02-26 LAB — CBC WITH DIFFERENTIAL (CANCER CENTER ONLY)
Abs Immature Granulocytes: 0.02 K/uL (ref 0.00–0.07)
Basophils Absolute: 0 K/uL (ref 0.0–0.1)
Basophils Relative: 1 %
Eosinophils Absolute: 0.2 K/uL (ref 0.0–0.5)
Eosinophils Relative: 3 %
HCT: 29.8 % — ABNORMAL LOW (ref 36.0–46.0)
Hemoglobin: 9.2 g/dL — ABNORMAL LOW (ref 12.0–15.0)
Immature Granulocytes: 0 %
Lymphocytes Relative: 26 %
Lymphs Abs: 2 K/uL (ref 0.7–4.0)
MCH: 22.1 pg — ABNORMAL LOW (ref 26.0–34.0)
MCHC: 30.9 g/dL (ref 30.0–36.0)
MCV: 71.6 fL — ABNORMAL LOW (ref 80.0–100.0)
Monocytes Absolute: 0.3 K/uL (ref 0.1–1.0)
Monocytes Relative: 3 %
Neutro Abs: 5.2 K/uL (ref 1.7–7.7)
Neutrophils Relative %: 67 %
Platelet Count: 145 K/uL — ABNORMAL LOW (ref 150–400)
RBC: 4.16 MIL/uL (ref 3.87–5.11)
RDW: 20.9 % — ABNORMAL HIGH (ref 11.5–15.5)
WBC Count: 7.7 K/uL (ref 4.0–10.5)
nRBC: 0 % (ref 0.0–0.2)

## 2024-02-26 LAB — CMP (CANCER CENTER ONLY)
ALT: 20 U/L (ref 0–44)
AST: 26 U/L (ref 15–41)
Albumin: 3.2 g/dL — ABNORMAL LOW (ref 3.5–5.0)
Alkaline Phosphatase: 142 U/L — ABNORMAL HIGH (ref 38–126)
Anion gap: 3 — ABNORMAL LOW (ref 5–15)
BUN: 12 mg/dL (ref 6–20)
CO2: 28 mmol/L (ref 22–32)
Calcium: 8.8 mg/dL — ABNORMAL LOW (ref 8.9–10.3)
Chloride: 108 mmol/L (ref 98–111)
Creatinine: 0.49 mg/dL (ref 0.44–1.00)
GFR, Estimated: >60 mL/min (ref >60)
Glucose, Bld: 81 mg/dL (ref 70–99)
Potassium: 3.7 mmol/L (ref 3.5–5.1)
Sodium: 139 mmol/L (ref 135–145)
Total Bilirubin: 0.9 mg/dL (ref 0.0–1.2)
Total Protein: 7.7 g/dL (ref 6.5–8.1)

## 2024-02-26 MED ORDER — SODIUM CHLORIDE 0.9 % IV SOLN: INTRAVENOUS | Status: DC

## 2024-02-26 MED ORDER — SODIUM CHLORIDE 0.9 % IV SOLN
3.0000 mg/kg | Freq: Once | INTRAVENOUS | Status: AC
  Administered 2024-02-26: 400 mg via INTRAVENOUS
  Filled 2024-02-26: qty 20

## 2024-02-26 MED ORDER — LORATADINE 10 MG PO TABS
10.0000 mg | ORAL_TABLET | Freq: Once | ORAL | Status: AC
  Administered 2024-02-26: 10 mg via ORAL
  Filled 2024-02-26: qty 1

## 2024-02-26 MED ORDER — PROCHLORPERAZINE MALEATE 10 MG PO TABS
10.0000 mg | ORAL_TABLET | Freq: Once | ORAL | Status: AC
  Administered 2024-02-26: 10 mg via ORAL
  Filled 2024-02-26: qty 1

## 2024-02-26 MED ORDER — ACETAMINOPHEN 325 MG PO TABS
650.0000 mg | ORAL_TABLET | Freq: Once | ORAL | Status: AC
  Administered 2024-02-26: 650 mg via ORAL
  Filled 2024-02-26: qty 2

## 2024-02-26 NOTE — Progress Notes (Signed)
 Per Dr. Loretha ok to treatment today with ECHO from 11/09/2023. ECHO is ordered.

## 2024-02-26 NOTE — Progress Notes (Signed)
 [facility] [phone]   [phone]  PROGRESS NOTE  Patient Care Team: Zollie Lowers, MD as PCP - General (Family Medicine) [name], MD as Consulting Physician (General Surgery) [name], MD as Consulting Physician (Hematology and Oncology) Izell Domino, MD as Attending Physician (Radiation Oncology)   CHIEF COMPLAINTS/PURPOSE OF CONSULTATION:  FU before next cycle of kadcyla .  Oncology History  Malignant neoplasm of upper-inner quadrant of left breast in female, estrogen receptor positive (HCC)  06/06/2022 Mammogram   Diagnostic mammogram given palpable mass showed suspicious spiculated left breast mass with surrounding nodularity at 9 o'clock position.  This measures up to 3.1 cm sonographically however due to deep location and patient body habitus the mammographic measurement of 5.8 cm is felt to be more accurate.  At least 2 abnormal lymph nodes in the left axilla.  Possible abnormal palpable left supraclavicular lymph node.   06/06/2022 Breast US    Breast ultrasound confirmed these findings.   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  Prognostic showed ER 65% weak to strong staining, PR 15% weak to moderate staining, Ki-67 of 50% and HER2 positive by IHC at 3+    Genetic Testing   [facility] Custom Panel+RNA was Negative. Report date is 06/28/2022.   The Custom Hereditary Cancers Panel offered by [facility] includes sequencing and/or deletion duplication testing of the following 43 genes: APC, ATM, AXIN2, BAP1, BARD1, BMPR1A, BRCA1, BRCA2, BRIP1, CDH1, CDK4, CDKN2A (p14ARF and p16INK4a only), CHEK2, CTNNA1, EPCAM (Deletion/duplication testing only), FH, GREM1 (promoter region duplication testing only), HOXB13, KIT, MBD4, MEN1, MLH1, MSH2, MSH3, MSH6, MUTYH, NF1, NHTL1,  PALB2, PDGFRA, PMS2, POLD1, POLE, PTEN, RAD51C, RAD51D, SMAD4, SMARCA4. STK11, TP53, TSC1, TSC2, and VHL.    06/21/2022 Cancer Staging   Staging form: Breast, AJCC 8th Edition - Clinical stage from 06/21/2022: Stage IV (cT3, cN3, cM1, G3, ER+, PR+, HER2+) - Signed by [name], MD on 09/17/2023 Stage prefix: Initial diagnosis Histologic grading system: 3 grade system Stage used in treatment planning: Yes National guidelines used in treatment planning: Yes Type of national guideline used in treatment planning: NCCN   06/29/2022 PET scan   IMPRESSION: 1. Hypermetabolic left breast mass, compatible with primary breast malignancy. 2. Hypermetabolic left cervical, axillary, subpectoral, supraclavicular and prevascular mediastinal lymph nodes, compatible with nodal metastatic disease. 3. No evidence of metastatic disease in the abdomen or pelvis. 4. Aortic Atherosclerosis (ICD10-I70.0).     Electronically Signed   By: [name] M.D.   On: 06/29/2022 09:32   07/11/2022 - 01/16/2023 Chemotherapy   Patient is on Treatment Plan : BREAST DOCEtaxel  + Trastuzumab  + Pertuzumab  (THP) q21d x 8 cycles / Trastuzumab  + Pertuzumab  q21d x 4 cycles     02/01/2023 - 06/29/2023 Chemotherapy   Patient is on Treatment Plan : BREAST METASTATIC Fam-Trastuzumab Deruxtecan-nxki  (Enhertu ) (5.4) q21d     10/02/2023 -  Chemotherapy   Patient is on Treatment Plan : BREAST ADO-Trastuzumab Emtansine  (Kadcyla ) q21d        HISTORY OF PRESENTING ILLNESS:  Diamond Marshall 53 y.o. female who is under the care of Dr. Loretha for breast cancer. She is currently receiving Kadcyla  every 21 days.  She is doing quite well since her last visit. No cough, chest pain or SOB. She has good energy and is able to do all her activities of daily  living. No worsening neuropathy reported. 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 No change in bowel habits. Rest  of the pertinent 10 point ROS reviewed and neg.  MEDICAL HISTORY:  Past Medical History:  Diagnosis Date   Abdominal hernia    Anemia    b12 deficiency   Anxiety    Arthritis    B12 deficiency    Back pain    Cancer (HCC)    breast cancer   Depression    Family history of adverse reaction to anesthesia    mother vomits after anesthesia   Gallbladder problem    GERD (gastroesophageal reflux disease)    History of hiatal hernia    small per patient   Hypertension    Hypothyroidism    Joint pain    Knee pain    Palpitation    when iron is low   Pernicious anemia    SOB (shortness of breath)    Thyroid  disease    hypothyroidism   Vitamin D  deficiency     SURGICAL HISTORY: Past Surgical History:  Procedure Laterality Date   BREAST BIOPSY Left 06/09/2022   US  LT BREAST BX W LOC DEV 1ST LESION IMG BX SPEC US  GUIDE 06/09/2022 [facility] MAMMOGRAPHY   CESAREAN SECTION  1999   Dilatation and currettagement     for miscarriage 18 years ago   PORTACATH PLACEMENT Right 07/05/2022   Procedure: INSERTION PORT-A-CATH;  Surgeon: [name], MD;  Location: [facility] OR;  Service: General;  Laterality: Right;   SIMPLE MASTECTOMY WITH AXILLARY SENTINEL NODE BIOPSY Bilateral 07/26/2023   Procedure: LEFT SKIN REDUCING MASTECTOMY, RIGHT SKIN SPARING, RISK REDUCING MASTECTOMY;  Surgeon: [name], MD;  Location: [facility] OR;  Service: General;  Laterality: Bilateral;  PEC BLOCK    SOCIAL HISTORY: Social History   Socioeconomic History   Marital status: Married    Spouse name: Garrel   Number of children: Not on file   Years of education: Not on file   Highest education level: Not on file  Occupational History   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  Tobacco Use   Smoking status: Never   Smokeless tobacco: Never  Vaping Use   Vaping status: Never Used  Substance and Sexual Activity   Alcohol use: No   Drug use: No   Sexual activity: Yes    Birth control/protection: Pill  Other  Topics Concern   Not on file  Social History Narrative   Not on file   Social Drivers of Health   Financial Resource Strain: Not on file  Food Insecurity: No Food Insecurity (10/09/2023)   Hunger Vital Sign    Worried About Running Out of Food in the Last Year: Never true    Ran Out of Food in the Last Year: Never true  Transportation Needs: No Transportation Needs (10/09/2023)   PRAPARE - Administrator, Civil Service (Medical): No    Lack of Transportation (Non-Medical): No  Physical Activity: Not on file  Stress: Not on file  Social Connections: Moderately Integrated (07/26/2023)   Social Connection and Isolation Panel    Frequency of Communication with Friends and Family: Three times a week    Frequency of Social Gatherings with Friends and Family: Three times a week    Attends Religious Services: More than 4 times per year    Active Member of Clubs or Organizations: No    Attends Banker Meetings: Never  Marital Status: Married  Catering manager Violence: Not At Risk (10/09/2023)   Humiliation, Afraid, Rape, and Kick questionnaire    Fear of Current or Ex-Partner: No    Emotionally Abused: No    Physically Abused: No    Sexually Abused: No    FAMILY HISTORY: Family History  Problem Relation Age of Onset   Hypertension Mother    Diabetes Mother    High Cholesterol Mother    Thyroid  disease Mother    Depression Mother    Anxiety disorder Mother    Obesity Mother    CVA Father        hemorrhagic   Ovarian cancer Maternal Aunt 53   Colon cancer Maternal Aunt    Breast cancer Cousin 39       maternal first cousin, reports negative genetic testing    ALLERGIES:  is allergic to phentermine , bee venom, lactose intolerance (gi), peanut allergen powder-dnfp, topiramate , sulfa antibiotics, tape, and wound dressing adhesive.  MEDICATIONS:  Current Outpatient Medications  Medication Sig Dispense Refill   acetaminophen  (TYLENOL ) 325 MG tablet Take  2 tablets (650 mg total) by mouth every 6 (six) hours as needed for mild pain (pain score 1-3) (or Fever >/= 101). 60 tablet 0   carvedilol  (COREG ) 3.125 MG tablet TAKE ONE TABLET TWICE DAILY WITH MEAL(S) 60 tablet 0   cyanocobalamin  (VITAMIN B12) 1000 MCG/ML injection INJECT 1 ML INTRAMUSCULARLY EVERY 15 DAYS (Patient taking differently: Inject 1,000 mcg into the skin every 30 (thirty) days.) 30 mL 3   cycloSPORINE  (RESTASIS ) 0.05 % ophthalmic emulsion Place 1 drop into both eyes 2 (two) times daily. 5.5 mL 1   EPINEPHrine  0.3 mg/0.3 mL IJ SOAJ injection Inject 0.3 mg into the muscle as needed for anaphylaxis. 2 each 0   ergocalciferol  (VITAMIN D2) 1.25 MG (50000 UT) capsule Take 1 capsule (50,000 Units total) by mouth once a week. 12 capsule 1   escitalopram  (LEXAPRO ) 20 MG tablet Take 1 tablet (20 mg total) by mouth daily. Please take citalopram 10 mg( half pill) only for next 3 days starting from tomorrow.  Then stop it until you finish the course of linezolid .  Can resume previous dose after  2 days of finishing linezolid      LORazepam  (ATIVAN ) 0.5 MG tablet Take 1 tablet (0.5 mg total) by mouth every 8 (eight) hours as needed for anxiety. 30 tablet 1   methylPREDNISolone  (MEDROL  DOSEPAK) 4 MG TBPK tablet Take as directed 21 each 0   oxyCODONE  (OXY IR/ROXICODONE ) 5 MG immediate release tablet Take 1 tablet (5 mg total) by mouth every 6 (six) hours as needed for severe pain (pain score 7-10). 15 tablet 0   pantoprazole  (PROTONIX ) 40 MG tablet Take 1 tablet (40 mg total) by mouth daily. 90 tablet 3   potassium chloride  (KLOR-CON ) 10 MEQ tablet TAKE TWO TABLETS DAILY AS DIRECTED 60 tablet 0   pregabalin  (LYRICA ) 50 MG capsule TAKE 1 CAPSULE 2 TIMES A DAY (STOP GABAPENTIN ) 60 capsule 0   No current facility-administered medications for this visit.    REVIEW OF SYSTEMS:   Constitutional: (+ ) fevers, ( + )  chills , ( - ) night sweats Eyes: ( - ) blurriness of vision, ( - ) double vision, ( - )  watery eyes Ears, nose, mouth, throat, and face: ( - ) mucositis, ( - ) sore throat Respiratory: ( - ) cough, ( - ) dyspnea, ( - ) wheezes Cardiovascular: ( - ) palpitation, ( - ) chest  discomfort, ( - ) lower extremity swelling Gastrointestinal:  ( + ) nausea, ( - ) heartburn, ( - ) change in bowel habits Skin: ( - ) abnormal skin rashes Lymphatics: ( - ) new lymphadenopathy, ( - ) easy bruising Neurological: ( - ) numbness, ( - ) tingling, ( - ) new weaknesses Behavioral/Psych: ( - ) mood change, ( - ) new changes  All other systems were reviewed with the patient and are negative.  PHYSICAL EXAMINATION: ECOG PERFORMANCE STATUS: 1 - Symptomatic but completely ambulatory  Vitals:   02/26/24 1024  BP: 139/68  Pulse: 80  Resp: 16  Temp: 97.7 F (36.5 C)  SpO2: 97%   Filed Weights   02/26/24 1024  Weight: 265 lb 14.4 oz (120.6 kg)    GENERAL: well appearing female in NAD  SKIN: skin color, texture, turgor are normal, no rashes or significant lesions EYES: conjunctiva are pink and non-injected, sclera clear OROPHARYNX: no exudate, no erythema; lips, buccal mucosa, and tongue normal  NECK: supple, non-tender LUNGS: clear to auscultation and percussion with normal breathing effort HEART: regular rate & rhythm and no murmurs and no lower extremity edema Musculoskeletal: no cyanosis of digits and no clubbing  PSYCH: alert & oriented x 3, fluent speech NEURO: no focal motor/sensory deficits  LABORATORY DATA:  I have reviewed the data as listed    Latest Ref Rng & Units 02/26/2024   10:03 AM 02/05/2024   10:47 AM 01/15/2024   10:43 AM  CBC  WBC 4.0 - 10.5 K/uL 7.7  5.6  6.0   Hemoglobin 12.0 - 15.0 g/dL 9.2  8.6  8.7   Hematocrit 36.0 - 46.0 % 29.8  27.9  28.2   Platelets 150 - 400 K/uL 145  179  220        Latest Ref Rng & Units 02/05/2024   10:47 AM 01/15/2024   10:43 AM 12/30/2023    8:50 PM  CMP  Glucose 70 - 99 mg/dL 847  90  96   BUN 6 - 20 mg/dL 8  10  9     Creatinine 0.44 - 1.00 mg/dL 9.35  9.38  9.26   Sodium 135 - 145 mmol/L 138  139  138   Potassium 3.5 - 5.1 mmol/L 3.6  3.7  3.2   Chloride 98 - 111 mmol/L 107  106  103   CO2 22 - 32 mmol/L 26  28  24    Calcium 8.9 - 10.3 mg/dL 8.6  8.9  8.7   Total Protein 6.5 - 8.1 g/dL 7.7  7.3  7.5   Total Bilirubin 0.0 - 1.2 mg/dL 0.7  0.8  1.0   Alkaline Phos 38 - 126 U/L 140  122  132   AST 15 - 41 U/L 29  34  82   ALT 0 - 44 U/L 14  15  23     RADIOGRAPHIC STUDIES: I have personally reviewed the radiological images as listed and agreed with the findings in the report. CT Chest Wo Contrast Result Date: 02/05/2024 CLINICAL DATA:  History of breast cancer follow-up multiple lung findings. * Tracking Code: BO * EXAM: CT CHEST WITHOUT CONTRAST TECHNIQUE: Multidetector CT imaging of the chest was performed following the standard protocol without IV contrast. RADIATION DOSE REDUCTION: This exam was performed according to the departmental dose-optimization program which includes automated exposure control, adjustment of the mA and/or kV according to patient size and/or use of iterative reconstruction technique. COMPARISON:  CT chest dated 01/08/2024, CTA  chest dated 10/06/2023 FINDINGS: Cardiovascular: Right chest wall port terminates in the lower SVC. Normal heart size. No significant pericardial fluid/thickening. Great vessels are normal in course and caliber. Mediastinum/Nodes: 1.3 cm right thyroid  nodule (2:9), previously biopsied. No specific follow-up imaging recommended. Normal esophagus. No pathologically enlarged axillary, supraclavicular, mediastinal, or hilar lymph nodes. Lungs/Pleura: The central airways are patent. Multifocal irregular and ground-glass subpleural and peribronchovascular opacities are again seen, similar to slightly increased compared to 01/08/2024, for example right middle lobe measuring 3.4 x 0.5 cm (6:74), previously 1.5 x 0.7 cm. No pneumothorax. No pleural effusion. Upper abdomen:  Partially imaged enlarged spleen measures 14.1 cm in AP dimension. Musculoskeletal: No acute or abnormal lytic or blastic osseous lesions. Postsurgical changes of bilateral mastectomy. Multilevel degenerative changes of the thoracic spine. IMPRESSION: 1. Persistent multifocal irregular and ground-glass subpleural and peribronchovascular opacities, similar to slightly increased compared to 01/08/2024. Findings are favored to represent an infectious/inflammatory process, including treatment-related pneumonitis. 2. Partially imaged splenomegaly. Electronically Signed   By: Limin  Xu M.D.   On: 02/05/2024 11:50     ASSESSMENT & PLAN Diamond Marshall is a 53 y.o. female who presents for a [facility] visit for evaluation of fever.   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 Last imaging showed some patchy alveolar opacities through out both lungs likely atypical infection. Her symptoms have improved.  No new concerning complaints today for worsening pneumonitis. Her lungs are clear to auscultation. We agreed to continue kadcyla . She is instructed to call me with any worsening symptoms and she understands. We will repeat imaging next month.  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 - Continue Lyrica  for neuropathy management. - She has noticed some improvement.  We can consider doing Guardant reveal at her next visit   No orders of the defined types were placed in this encounter.   All questions were answered. The patient knows to call the clinic with any problems, questions or concerns.  I have spent a total of 30 minutes minutes of face-to-face and non-face-to-face time, preparing to see the patient,  performing a medically appropriate examination, counseling and educating the patient, ordering medications/tests/procedures,documenting  clinical information in the electronic health record, independently interpreting results and communicating results to the patient, and care coordination.   Amber Stalls MD

## 2024-02-26 NOTE — Patient Instructions (Signed)
 [facility] - A DEPT OF [facility]  Discharge Instructions: Thank you for choosing [facility] to provide your oncology and hematology care.   If you have a lab appointment with the Cancer Center, please go directly to the Cancer Center and check in at the registration area.   Wear comfortable clothing and clothing appropriate for easy access to any Portacath or PICC line.   We strive to give you quality time with your provider. You may need to reschedule your appointment if you arrive late (15 or more minutes).  Arriving late affects you and other patients whose appointments are after yours.  Also, if you miss three or more appointments without notifying the office, you may be dismissed from the clinic at the provider's discretion.      For prescription refill requests, have your pharmacy contact our office and allow 72 hours for refills to be completed.    Today you received the following chemotherapy and/or immunotherapy agent: ado-Trastuzumab  (Kadcyla )   To help prevent nausea and vomiting after your treatment, we encourage you to take your nausea medication as directed.  BELOW ARE SYMPTOMS THAT SHOULD BE REPORTED IMMEDIATELY: *FEVER GREATER THAN 100.4 F (38 C) OR HIGHER *CHILLS OR SWEATING *NAUSEA AND VOMITING THAT IS NOT CONTROLLED WITH YOUR NAUSEA MEDICATION *UNUSUAL SHORTNESS OF BREATH *UNUSUAL BRUISING OR BLEEDING *URINARY PROBLEMS (pain or burning when urinating, or frequent urination) *BOWEL PROBLEMS (unusual diarrhea, constipation, pain near the anus) TENDERNESS IN MOUTH AND THROAT WITH OR WITHOUT PRESENCE OF ULCERS (sore throat, sores in mouth, or a toothache) UNUSUAL RASH, SWELLING OR PAIN  UNUSUAL VAGINAL DISCHARGE OR ITCHING   Items with * indicate a potential emergency and should be followed up as soon as possible or go to the Emergency Department if any problems should occur.  Please show the CHEMOTHERAPY ALERT CARD or  IMMUNOTHERAPY ALERT CARD at check-in to the Emergency Department and triage nurse.  Should you have questions after your visit or need to cancel or reschedule your appointment, please contact [facility] - A DEPT OF [facility]  Dept: [phone]  and follow the prompts.  Office hours are 8:00 a.m. to 4:30 p.m. Monday - Friday. Please note that voicemails left after 4:00 p.m. may not be returned until the following business day.  We are closed weekends and major holidays. You have access to a nurse at all times for urgent questions. Please call the main number to the clinic Dept: [phone] and follow the prompts.   For any non-urgent questions, you may also contact your provider using MyChart. We now offer e-Visits for anyone 70 and older to request care online for non-urgent symptoms. For details visit [email or web address].   Also download the MyChart app! Go to the app store, search MyChart, open the app, select [facility], and log in with your MyChart username and password.

## 2024-02-27 ENCOUNTER — Encounter: Payer: Self-pay | Admitting: Hematology and Oncology

## 2024-02-27 ENCOUNTER — Ambulatory Visit (INDEPENDENT_AMBULATORY_CARE_PROVIDER_SITE_OTHER): Admitting: Family Medicine

## 2024-02-27 ENCOUNTER — Encounter: Payer: Self-pay | Admitting: Family Medicine

## 2024-02-27 VITALS — BP 145/78 | HR 93 | Temp 98.3°F | Ht 61.0 in | Wt 266.8 lb

## 2024-02-27 DIAGNOSIS — K2100 Gastro-esophageal reflux disease with esophagitis, without bleeding: Secondary | ICD-10-CM

## 2024-02-27 DIAGNOSIS — F32A Depression, unspecified: Secondary | ICD-10-CM

## 2024-02-27 DIAGNOSIS — R41840 Attention and concentration deficit: Secondary | ICD-10-CM | POA: Diagnosis not present

## 2024-02-27 DIAGNOSIS — Z170 Estrogen receptor positive status [ER+]: Secondary | ICD-10-CM

## 2024-02-27 DIAGNOSIS — C50212 Malignant neoplasm of upper-inner quadrant of left female breast: Secondary | ICD-10-CM

## 2024-02-27 DIAGNOSIS — F419 Anxiety disorder, unspecified: Secondary | ICD-10-CM

## 2024-02-27 MED ORDER — ESCITALOPRAM OXALATE 20 MG PO TABS: 20.0000 mg | ORAL_TABLET | Freq: Every day | ORAL | 3 refills | Status: AC

## 2024-02-27 MED ORDER — PANTOPRAZOLE SODIUM 40 MG PO TBEC: 40.0000 mg | DELAYED_RELEASE_TABLET | Freq: Every day | ORAL | 3 refills | Status: AC

## 2024-02-27 NOTE — Progress Notes (Signed)
 Subjective:  Patient ID: Diamond Marshall, female    DOB: 1970-06-23  Age: 53 y.o. MRN: [id number]  CC: Medical Management of Chronic Issues (Discuss ADHD)   HPI  Discussed the use of AI scribe software for clinical note transcription with the patient, who gave verbal consent to proceed.  History of Present Illness Diamond Marshall is a 53 year old female who presents with concerns about attention and distractibility.  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 Her job requires high levels of concentration, which she finds challenging. She finds that using an audiobook helps her focus better. She can read for extended periods at night without distraction.  She has a history of stage four cancer and has undergone extensive chemotherapy, which she refers to as causing 'chemo brain,' leading to forgetfulness and scatterbrained moments. She continues to receive treatment and takes carvedilol , which is monitored with echocardiograms every three months. Her ejection fraction was reported as 60-65%.  She takes pantoprazole  40 mg daily for reflux, which is well-controlled with the medication. She experiences heartburn if she misses a dose. She also takes escitalopram  20 mg daily for emotional and psychological support, and she has joined support groups and uses humor to cope with her cancer diagnosis.  She has a history of MRSA infection following a mastectomy, which required a wound vac for treatment. A friend who is a nurse assisted with the wound care due to the lack of available home health care services. She also experienced a sinus infection that required prednisone  treatment after a CT scan showed opacities in her lungs, which were initially suspected to be related to COVID-19, as she had COVID twice.  She takes a potassium supplement and has been prescribed two iron pills daily to address low iron levels. She occasionally  uses lorazepam  for severe anxiety, but only once or twice a year. She has a history of a wound that was slow to heal due to encapsulated gauze, which resolved after removal.          02/27/2024    3:42 PM 02/05/2024   12:14 PM 02/05/2024   11:00 AM  Depression screen PHQ 2/9  Decreased Interest 0 0 0  Down, Depressed, Hopeless 1 0 0  PHQ - 2 Score 1 0 0  Altered sleeping 0    Tired, decreased energy 1    Change in appetite 0    Feeling bad or failure about yourself  0    Trouble concentrating 3    Moving slowly or fidgety/restless 0    Suicidal thoughts 0    PHQ-9 Score 5    Difficult doing work/chores Somewhat difficult      History Diamond Marshall has a past medical history of Abdominal hernia, Anemia, Anxiety, Arthritis, B12 deficiency, Back pain, Cancer (HCC), Depression, Family history of adverse reaction to anesthesia, Gallbladder problem, GERD (gastroesophageal reflux disease), History of hiatal hernia, Hypertension, Hypothyroidism, Joint pain, Knee pain, Palpitation, Pernicious anemia, SOB (shortness of breath), Thyroid  disease, and Vitamin D  deficiency.   She has a past surgical history that includes Cesarean section (1999); Dilatation and currettagement; Breast biopsy (Left, 06/09/2022); Portacath placement (Right, 07/05/2022); and Simple mastectomy with axillary sentinel node biopsy (Bilateral, 07/26/2023).   Her family history includes Anxiety disorder in her mother; Breast cancer (age of onset: 54) in her cousin; CVA in her father; Colon cancer in her maternal aunt; Depression in her mother; Diabetes in her mother; High Cholesterol in her  mother; Hypertension in her mother; Obesity in her mother; Ovarian cancer (age of onset: 104) in her maternal aunt; Thyroid  disease in her mother.She reports that she has never smoked. She has never used smokeless tobacco. She reports that she does not drink alcohol and does not use drugs.    ROS Review of Systems  Constitutional: Negative.    HENT:  Negative for congestion.   Eyes:  Negative for visual disturbance.  Respiratory:  Negative for shortness of breath.   Cardiovascular:  Negative for chest pain.  Gastrointestinal:  Negative for abdominal pain, constipation, diarrhea, nausea and vomiting.  Genitourinary:  Negative for difficulty urinating.  Musculoskeletal:  Negative for arthralgias and myalgias.  Neurological:  Negative for headaches.  Psychiatric/Behavioral:  Negative for sleep disturbance.     Objective:  BP (!) 145/78   Pulse 93   Temp 98.3 F (36.8 C)   Ht 5' 1 (1.549 m)   Wt 266 lb 12.8 oz (121 kg)   LMP 10/13/2019 (Approximate)   SpO2 95%   BMI 50.41 kg/m   BP Readings from Last 3 Encounters:  02/27/24 (!) 145/78  02/26/24 139/68  02/05/24 (!) 139/51    Wt Readings from Last 3 Encounters:  02/27/24 266 lb 12.8 oz (121 kg)  02/26/24 265 lb 14.4 oz (120.6 kg)  02/05/24 268 lb 4.8 oz (121.7 kg)     Physical Exam Physical Exam GENERAL: Alert, cooperative, well developed, no acute distress. HEENT: Normocephalic, normal oropharynx, moist mucous membranes. CHEST: Clear to auscultation bilaterally, no wheezes, rhonchi, or crackles. CARDIOVASCULAR: Normal heart rate and rhythm, S1 and S2 normal without murmurs. ABDOMEN: Soft, non-tender, non-distended, without organomegaly, normal bowel sounds. EXTREMITIES: No cyanosis or edema. NEUROLOGICAL: Cranial nerves grossly intact, moves all extremities without gross motor or sensory deficit.   Assessment & Plan:  Malignant neoplasm of upper-inner quadrant of left breast in female, estrogen receptor positive (HCC)  Anxiety and depression -     Escitalopram  Oxalate; Take 1 tablet (20 mg total) by mouth daily.  Dispense: 90 tablet; Refill: 3  Attention or concentration deficit  Gastroesophageal reflux disease with esophagitis without hemorrhage  Other orders -     Pantoprazole  Sodium; Take 1 tablet (40 mg total) by mouth daily.  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 She uses humor and support groups for emotional coping. Continue the current cancer management plan with the oncology team.  Heart failure with preserved ejection fraction   Her heart failure is managed with carvedilol  3.125 mg twice daily, and her ejection fraction remains normal at 65%. Continue carvedilol  and monitor cardiac function with regular echocardiograms.  Depression and anxiety   Her depression and anxiety are managed with escitalopram  20 mg daily. She uses humor and support groups as coping mechanisms and reports feeling overwhelmed at times but manages well with current support systems. Continue escitalopram  and encourage the continued use of support groups and humor.  Attention deficit symptoms, evaluation planned   She experiences distractibility and inattentiveness but can focus well on certain activities. She is concerned about cardiac side effects of amphetamines due to her heart condition and current medications. Amphetamines can cause heart palpitations, so testing is recommended before starting medication. Discuss with Dr. Adelaide regarding the appropriateness of psychological testing and consider referral to a neuropsychologist or psychiatrist if testing is pursued.  Gastroesophageal reflux disease (GERD)   Her GERD is well-controlled with pantoprazole  40 mg daily. Missing doses results in heartburn, indicating medication effectiveness. Continue pantoprazole .  Iron deficiency on iron supplementation   Her iron deficiency is managed with supplementation, showing improvement in iron levels, but she experiences constipation as a side effect. Continue iron supplementation and encourage consumption of dried stone fruits to alleviate constipation.       Follow-up: Return in  about 6 months (around 08/27/2024).  Butler Der, M.D.

## 2024-02-27 NOTE — Patient Instructions (Signed)
 Attention Deficit Hyperactivity Disorder, Adult Attention deficit hyperactivity disorder (ADHD) is a mental health disorder that starts during childhood. For many people with ADHD, the disorder continues into the adult years. Treatment can help you manage your symptoms. There are three main types of ADHD: Inattentive. With this type, adults have difficulty paying attention. This may affect cognitive abilities. Hyperactive-impulsive. With this type, adults have a lot of energy and have difficulty controlling their behavior. Combination type. Some people may have symptoms of both types. What are the causes? The exact cause of ADHD is not known. Most experts believe a person's genes and environment possibly contribute to ADHD. What increases the risk? The following factors may make you more likely to develop this condition: Having a first-degree relative such as a parent, brother, or sister, with the condition. Being born before 37 weeks of pregnancy (prematurely) or at a low birth weight. Being born to a mother who smoked tobacco or drank alcohol  during pregnancy. Having experienced a brain injury. Being exposed to lead or other toxins in the womb or early in life. What are the signs or symptoms? Symptoms of this condition depend on the type of ADHD. Symptoms of the inattentive type include: Difficulty paying attention or following instructions. Often making simple mistakes. Being disorganized. Avoiding tasks that require time and attention. Losing and forgetting things. Symptoms of the hyperactive-impulsive type include: Restlessness. Talking out of turn, interrupting others, or talking too much. Difficulty with: Sitting still. Feeling motivated. Relaxing. Waiting in line or waiting for a turn. People with the combination type have symptoms of both of the other types. In adults, this condition may lead to certain problems, such as: Keeping jobs. Performing tasks at work. Having  stable relationships. Being on time or keeping to a schedule. How is this diagnosed? This condition is diagnosed based on your current symptoms and your history of symptoms. The diagnosis can be made by a health care provider such as a primary care provider or a mental health care specialist. Your health care provider may use a symptom checklist or a behavior rating scale to evaluate your symptoms. Your health care provider may also want to talk with people who have observed your behaviors throughout your life. How is this treated? This condition can be treated with medicines and behavior therapy. Medicines may be the best option to reduce impulsive behaviors and improve attention. Your health care provider may recommend: Stimulant medicines. These are the most common medicines used for adult ADHD. They affect certain chemicals in the brain (neurotransmitters) and improve your ability to control your symptoms. A non-stimulant medicine. These medicines can also improve focus, attention, and impulsive behavior. It may take weeks to months to see the effects of this medicine. Counseling and behavioral management are also important for treating ADHD. Counseling is often used along with medicine. Your health care provider may suggest: Cognitive behavioral therapy (CBT). This type of therapy teaches you to replace negative thoughts and actions with positive thoughts and actions. When used as part of ADHD treatment, this therapy may also include: Coping strategies for organization, time management, impulse control, and stress reduction. Mindfulness and meditation training. Behavioral management. You may work with a Psychologist, occupational who is specially trained to help people with ADHD manage and organize activities and function more effectively. Follow these instructions at home: Medicines  Take over-the-counter and prescription medicines only as told by your health care provider. Talk with your health care provider  about the possible side effects of your medicines and  how to manage them. Alcohol  use Do not drink alcohol  if: Your health care provider tells you not to drink. You are pregnant, may be pregnant, or are planning to become pregnant. If you drink alcohol : Limit how much you use to: 0-1 drink a [place] for women. 0-2 drinks a Cange for men. Know how much alcohol  is in your drink. In the [place] one drink equals one 12 oz bottle of beer (355 mL), one 5 oz glass of wine (148 mL), or one 1 oz glass of hard liquor (44 mL). Lifestyle  Do not use illegal drugs. Get enough sleep. Eat a healthy diet. Exercise regularly. Exercise can help to reduce stress and anxiety. General instructions Learn as much as you can about adult ADHD, and work closely with your health care providers to find the treatments that work best for you. Follow the same schedule each Vittorio. Use reminder devices like notes, calendars, and phone apps to stay on time and organized. Keep all follow-up visits. Your health care provider will need to monitor your condition and adjust your treatment over time. Where to find more information A health care provider may be able to recommend resources that are available online or over the phone. You could start with: Attention Deficit Disorder Association (ADDA): HotterNames.de [employer] of Mental Health [facility]): BloggerCourse.com Contact a health care provider if: Your symptoms continue to cause problems. You have side effects from your medicine, such as: Repeated muscle twitches, coughing, or speech outbursts. Sleep problems. Loss of appetite. Dizziness. Unusually fast heartbeat. Stomach pains. Headaches. You are struggling with anxiety, depression, or substance abuse. Get help right away if: You have a severe reaction to a medicine. This symptom may be an emergency. Get help right away. Call 911. Do not wait to see if the symptom will go away. Do not drive yourself to the hospital. Take  one of these steps if you feel like you may hurt yourself or others, or have thoughts about taking your own life: Go to your nearest emergency room. Call 911. Call the National Suicide Prevention Lifeline at 8567120438 or 988. This is open 24 hours a [employer] Text the Crisis Text Line at 904-635-3634. Summary ADHD is a mental health disorder that starts during childhood and often continues into your adult years. The exact cause of ADHD is not known. Most experts believe genetics and environmental factors contribute to ADHD. There is no cure for ADHD, but treatment with medicine, cognitive behavioral therapy, or behavioral management can help you manage your condition. This information is not intended to replace advice given to you by your health care provider. Make sure you discuss any questions you have with your health care provider. Document Revised: 08/26/2021 Document Reviewed: 08/26/2021 Elsevier Patient Education  2024 [employer].

## 2024-02-28 ENCOUNTER — Other Ambulatory Visit: Payer: Self-pay

## 2024-02-29 ENCOUNTER — Ambulatory Visit (HOSPITAL_COMMUNITY)

## 2024-02-29 ENCOUNTER — Emergency Department (HOSPITAL_COMMUNITY)
Admission: EM | Admit: 2024-02-29 | Discharge: 2024-02-29 | Disposition: A | Discharge status: Home / Self Care | Source: Ambulatory Visit | Attending: Emergency Medicine | Admitting: Emergency Medicine

## 2024-02-29 ENCOUNTER — Other Ambulatory Visit: Payer: Self-pay

## 2024-02-29 ENCOUNTER — Emergency Department (HOSPITAL_COMMUNITY)

## 2024-02-29 DIAGNOSIS — E039 Hypothyroidism, unspecified: Secondary | ICD-10-CM | POA: Diagnosis not present

## 2024-02-29 DIAGNOSIS — R509 Fever, unspecified: Secondary | ICD-10-CM | POA: Diagnosis present

## 2024-02-29 DIAGNOSIS — I10 Essential (primary) hypertension: Secondary | ICD-10-CM | POA: Insufficient documentation

## 2024-02-29 DIAGNOSIS — D696 Thrombocytopenia, unspecified: Secondary | ICD-10-CM | POA: Insufficient documentation

## 2024-02-29 DIAGNOSIS — Z853 Personal history of malignant neoplasm of breast: Secondary | ICD-10-CM | POA: Diagnosis not present

## 2024-02-29 DIAGNOSIS — R502 Drug induced fever: Secondary | ICD-10-CM | POA: Diagnosis not present

## 2024-02-29 LAB — RESP PANEL BY RT-PCR (RSV, FLU A&B, COVID)  RVPGX2
Influenza A by PCR: NEGATIVE
Influenza B by PCR: NEGATIVE
Resp Syncytial Virus by PCR: NEGATIVE
SARS Coronavirus 2 by RT PCR: NEGATIVE

## 2024-02-29 LAB — COMPREHENSIVE METABOLIC PANEL WITH GFR
ALT: 27 U/L (ref 0–44)
AST: 61 U/L — ABNORMAL HIGH (ref 15–41)
Albumin: 3.4 g/dL — ABNORMAL LOW (ref 3.5–5.0)
Alkaline Phosphatase: 188 U/L — ABNORMAL HIGH (ref 38–126)
Anion gap: 11 (ref 5–15)
BUN: 12 mg/dL (ref 6–20)
CO2: 25 mmol/L (ref 22–32)
Calcium: 9.1 mg/dL (ref 8.9–10.3)
Chloride: 99 mmol/L (ref 98–111)
Creatinine, Ser: 0.68 mg/dL (ref 0.44–1.00)
GFR, Estimated: >60 mL/min (ref >60)
Glucose, Bld: 89 mg/dL (ref 70–99)
Potassium: 3.5 mmol/L (ref 3.5–5.1)
Sodium: 135 mmol/L (ref 135–145)
Total Bilirubin: 1 mg/dL (ref 0.0–1.2)
Total Protein: 7.7 g/dL (ref 6.5–8.1)

## 2024-02-29 LAB — URINALYSIS, W/ REFLEX TO CULTURE (INFECTION SUSPECTED)
Bacteria, UA: NONE SEEN
Bilirubin Urine: NEGATIVE
Glucose, UA: NEGATIVE mg/dL
Hgb urine dipstick: NEGATIVE
Ketones, ur: NEGATIVE mg/dL
Nitrite: NEGATIVE
Protein, ur: NEGATIVE mg/dL
Specific Gravity, Urine: 1.011 (ref 1.005–1.030)
pH: 7 (ref 5.0–8.0)

## 2024-02-29 LAB — CBC WITH DIFFERENTIAL/PLATELET
Abs Immature Granulocytes: 0.06 K/uL (ref 0.00–0.07)
Basophils Absolute: 0 K/uL (ref 0.0–0.1)
Basophils Relative: 1 %
Eosinophils Absolute: 0.2 K/uL (ref 0.0–0.5)
Eosinophils Relative: 2 %
HCT: 32.9 % — ABNORMAL LOW (ref 36.0–46.0)
Hemoglobin: 9.6 g/dL — ABNORMAL LOW (ref 12.0–15.0)
Immature Granulocytes: 1 %
Lymphocytes Relative: 17 %
Lymphs Abs: 1.1 K/uL (ref 0.7–4.0)
MCH: 21.4 pg — ABNORMAL LOW (ref 26.0–34.0)
MCHC: 29.2 g/dL — ABNORMAL LOW (ref 30.0–36.0)
MCV: 73.4 fL — ABNORMAL LOW (ref 80.0–100.0)
Monocytes Absolute: 0.4 K/uL (ref 0.1–1.0)
Monocytes Relative: 6 %
Neutro Abs: 4.8 K/uL (ref 1.7–7.7)
Neutrophils Relative %: 73 %
Platelets: 123 K/uL — ABNORMAL LOW (ref 150–400)
RBC: 4.48 MIL/uL (ref 3.87–5.11)
RDW: 21.7 % — ABNORMAL HIGH (ref 11.5–15.5)
WBC: 6.6 K/uL (ref 4.0–10.5)
nRBC: 0 % (ref 0.0–0.2)

## 2024-02-29 LAB — I-STAT CG4 LACTIC ACID, ED
Lactic Acid, Venous: 0.7 mmol/L (ref 0.5–1.9)
Lactic Acid, Venous: 0.7 mmol/L (ref 0.5–1.9)
Lactic Acid, Venous: 0.7 mmol/L (ref 0.5–1.9)

## 2024-02-29 LAB — PROTIME-INR
INR: 1.1 (ref 0.8–1.2)
Prothrombin Time: 15.1 s (ref 11.4–15.2)

## 2024-02-29 LAB — LIPASE, BLOOD: Lipase: 21 U/L (ref 11–51)

## 2024-02-29 MED ORDER — LACTATED RINGERS IV SOLN: INTRAVENOUS | Status: DC

## 2024-02-29 MED ORDER — ACETAMINOPHEN 325 MG PO TABS
650.0000 mg | ORAL_TABLET | Freq: Once | ORAL | Status: AC | PRN
  Administered 2024-02-29: 650 mg via ORAL
  Filled 2024-02-29: qty 2

## 2024-02-29 MED ORDER — SODIUM CHLORIDE 0.9 % IV SOLN
2.0000 g | Freq: Once | INTRAVENOUS | Status: AC
  Administered 2024-02-29: 2 g via INTRAVENOUS
  Filled 2024-02-29: qty 20

## 2024-02-29 NOTE — Discharge Instructions (Signed)
 Thank you for coming to [facility] Emergency Department. You were seen for fever after chemotherapy. We did an exam, labs, and imaging, and these showed a mildly low platelet count.  You were given a dose of IV antibiotics.  Blood cultures were drawn.  You will be notified if these result positive.  If you develop any new or concerning symptoms please return to the emergency department.  Please follow-up with Dr. Loretha within 1 week for follow-up and lab recheck.   Do not hesitate to return to the ED or call 911 if you experience: -Worsening symptoms -Severe abdominal pain -Nausea vomiting so severe you cannot eat or drink anything - Pain with urination - Cough or shortness of breath -Lightheadedness, passing out -Fevers/chills -Anything else that concerns you

## 2024-02-29 NOTE — Sepsis Progress Note (Addendum)
 Elink monitoring for the code sepsis protocol.   1923: Notified provider of need to order antibiotics and fluid bolus. Per provider patient still in waiting room.

## 2024-02-29 NOTE — ED Triage Notes (Signed)
 Chemo on Tuesday. Breast CA.  Fevers/ N/V starting around 2pm. Temps around 103F at home.

## 2024-02-29 NOTE — ED Provider Notes (Signed)
 [place] EMERGENCY DEPARTMENT AT [facility] Provider Note   CSN: [id number] Arrival date & time: 02/29/24  1633     History  Chief Complaint  Patient presents with   Fever    Diamond Marshall is a 53 y.o. female with PMH as listed below who presents with fever.  Pt complains of fever with Tmax of 103 F as well as nausea and vomiting.  Reports that she had her most recent cycle of chemo on 10/7.  Reports that she typically will have a similar presentation of her chemo is not coadministered with prednisone  where she develops fever, nausea, vomiting 2 to 3 days after chemo.  Reports that she has metastatic breast cancer and is currently on her 22nd cycle of chemo.  Reports that after receiving Tylenol  her nausea, vomiting, fever resolved and she feels back to baseline.  Past Medical History:  Diagnosis Date   Abdominal hernia    Anemia    b12 deficiency   Anxiety    Arthritis    B12 deficiency    Back pain    Cancer (HCC)    breast cancer   Depression    Family history of adverse reaction to anesthesia    mother vomits after anesthesia   Gallbladder problem    GERD (gastroesophageal reflux disease)    History of hiatal hernia    small per patient   Hypertension    Hypothyroidism    Joint pain    Knee pain    Palpitation    when iron is low   Pernicious anemia    SOB (shortness of breath)    Thyroid  disease    hypothyroidism   Vitamin D  deficiency        Home Medications Prior to Admission medications   Medication Sig Start Date End Date Taking? Authorizing Provider  acetaminophen  (TYLENOL ) 325 MG tablet Take 2 tablets (650 mg total) by mouth every 6 (six) hours as needed for mild pain (pain score 1-3) (or Fever >/= 101). 07/27/23   Cornett, Debby, MD  carvedilol  (COREG ) 3.125 MG tablet TAKE ONE TABLET TWICE DAILY WITH MEAL(S) 01/31/24   Iruku, Praveena, MD  cyanocobalamin  (VITAMIN B12) 1000 MCG/ML injection INJECT 1 ML INTRAMUSCULARLY EVERY 15  DAYS Patient taking differently: Inject 1,000 mcg into the skin every 30 (thirty) days. 05/18/23   Gudena, Vinay, MD  cycloSPORINE  (RESTASIS ) 0.05 % ophthalmic emulsion Place 1 drop into both eyes 2 (two) times daily. 06/15/23   Iruku, Praveena, MD  EPINEPHrine  0.3 mg/0.3 mL IJ SOAJ injection Inject 0.3 mg into the muscle as needed for anaphylaxis. 12/16/21   Merlynn Niki FALCON, FNP  ergocalciferol  (VITAMIN D2) 1.25 MG (50000 UT) capsule Take 1 capsule (50,000 Units total) by mouth once a week. 11/19/23   Crawford Morna Pickle, NP  escitalopram  (LEXAPRO ) 20 MG tablet Take 1 tablet (20 mg total) by mouth daily. 02/27/24   Zollie Lowers, MD  LORazepam  (ATIVAN ) 0.5 MG tablet Take 1 tablet (0.5 mg total) by mouth every 8 (eight) hours as needed for anxiety. 06/22/22   Iruku, Praveena, MD  methylPREDNISolone  (MEDROL  DOSEPAK) 4 MG TBPK tablet Take as directed 02/05/24   Iruku, Praveena, MD  oxyCODONE  (OXY IR/ROXICODONE ) 5 MG immediate release tablet Take 1 tablet (5 mg total) by mouth every 6 (six) hours as needed for severe pain (pain score 7-10). 01/04/24   Iruku, Praveena, MD  pantoprazole  (PROTONIX ) 40 MG tablet Take 1 tablet (40 mg total) by mouth daily. 02/27/24  Zollie Lowers, MD  potassium chloride  (KLOR-CON ) 10 MEQ tablet TAKE TWO TABLETS DAILY AS DIRECTED 01/31/24   Iruku, Praveena, MD  pregabalin  (LYRICA ) 50 MG capsule TAKE 1 CAPSULE 2 TIMES A DAY (STOP GABAPENTIN ) 02/04/24   Iruku, Praveena, MD      Allergies    Phentermine , Bee venom, Lactose intolerance (gi), Peanut allergen powder-dnfp, Topiramate , Sulfa antibiotics, Tape, and Wound dressing adhesive    Review of Systems   Review of Systems A 10 point review of systems was performed and is negative unless otherwise reported in HPI.  Physical Exam Updated Vital Signs BP 121/69   Pulse 85   Temp 99.1 F (37.3 C) (Oral)   Resp 18   LMP 10/13/2019 (Approximate)   SpO2 93%  Physical Exam General: Normal appearing female, lying in bed.   HEENT: PERRLA, Sclera anicteric, MMM, trachea midline.  Cardiology: RRR, no murmurs/rubs/gallops.  No tenderness to palpation or erythema in the area of her chest port. Resp: Normal respiratory rate and effort. CTAB, no wheezes, rhonchi, crackles.  Abd: Soft, non-tender, non-distended. No rebound tenderness or guarding.  GU: Deferred. MSK: No peripheral edema or signs of trauma. Extremities without deformity or TTP. No cyanosis or clubbing. Skin: warm, dry.  Neuro: A&Ox4, CNs II-XII grossly intact. MAEs. Sensation grossly intact.  Psych: Normal mood and affect.   ED Results / Procedures / Treatments   Labs (all labs ordered are listed, but only abnormal results are displayed) Labs Reviewed  COMPREHENSIVE METABOLIC PANEL WITH GFR - Abnormal; Notable for the following components:      Result Value   Albumin 3.4 (*)    AST 61 (*)    Alkaline Phosphatase 188 (*)    All other components within normal limits  CBC WITH DIFFERENTIAL/PLATELET - Abnormal; Notable for the following components:   Hemoglobin 9.6 (*)    HCT 32.9 (*)    MCV 73.4 (*)    MCH 21.4 (*)    MCHC 29.2 (*)    RDW 21.7 (*)    Platelets 123 (*)    All other components within normal limits  URINALYSIS, W/ REFLEX TO CULTURE (INFECTION SUSPECTED) - Abnormal; Notable for the following components:   Leukocytes,Ua MODERATE (*)    All other components within normal limits  RESP PANEL BY RT-PCR (RSV, FLU A&B, COVID)  RVPGX2  CULTURE, BLOOD (ROUTINE X 2)  CULTURE, BLOOD (ROUTINE X 2)  PROTIME-INR  LIPASE, BLOOD  I-STAT CG4 LACTIC ACID, ED  I-STAT CG4 LACTIC ACID, ED  I-STAT CG4 LACTIC ACID, ED    EKG EKG Interpretation Date/Time:  Friday February 29 2024 19:21:24 EDT Ventricular Rate:  94 PR Interval:  141 QRS Duration:  98 QT Interval:  375 QTC Calculation: 469 R Axis:   149  Text Interpretation: Right and left arm electrode reversal, interpretation assumes no reversal Sinus or ectopic atrial rhythm Inferolateral  infarct, age indeterminate Confirmed by Elnor Savant ([id number]) on 03/03/2024 7:35:39 PM  Radiology CXR: Cardiac enlargement with mild vascular congestion. Fine interstitial pattern to the lung bases may indicate early edema. No focal consolidation.  Procedures Procedures    Medications Ordered in ED Medications  acetaminophen  (TYLENOL ) tablet 650 mg (650 mg Oral Given 02/29/24 1644)    ED Course/ Medical Decision Making/ A&P                          Medical Decision Making Amount and/or Complexity of Data Reviewed Labs:  Decision-making details documented in  ED Course. Radiology:  Decision-making details documented in ED Course.  Risk OTC drugs.    This patient presents to the ED for concern of fever after chemotherapy, this involves an extensive number of treatment options, and is a complaint that carries with it a high risk of complications and morbidity.  I considered the following differential and admission for this acute, potentially life threatening condition.  Patient is overall extremely well-appearing and hemodynamically stable except with a fever of 103.2.  MDM:    Patient presents with fever after her chemotherapy.  She states that this happens to her sometimes.  She denies any symptoms of infection except for some nausea vomiting which is now resolved.  Her labs are extremely reassuring except for mild increased elevation in her alk phos and a mildly decreasing thrombocytopenia which could be expected in the setting of known malignancy and chemotherapy treatment.  No leukocytosis, no UTI, no pneumonia on chest x-ray.  Patient states that when this has happened before she typically does get a dose of IV antibiotics and then is discharged.  She feels well and would prefer to be discharged.  She is not neutropenic.  Will obtain blood cultures, give IV ceftriaxone  and touch base with oncology.  On reevaluation of her temperature after Tylenol  it is 99.1 F.  Clinical Course as  of 03/09/24 0956  Fri Feb 29, 2024  1933 Lactic Acid, Venous: 0.7 wnl [HN]  1933 Resp panel by RT-PCR (RSV, Flu A&B, Covid) Anterior Nasal Swab neg [HN]  2014 Hemoglobin(!): 9.6 Stable anemia [HN]  2014 WBC: 6.6 No leukocytosis  [HN]  2014 Lipase: 21 wnl [HN]  2015 DG Chest Port 1 View Cardiac enlargement with mild vascular congestion. Fine interstitial pattern to the lung bases may indicate early edema. No focal consolidation.   [HN]  2015 Alkaline Phosphatase(!): 188 Mild increased elevation in alk phos [HN]  2015 Platelets(!): 123 Decreasing thrombocytopenia [HN]  2208 Urinalysis, w/ Reflex to Culture (Infection Suspected) -Urine, Clean Catch(!) Negative for UTI [HN]  2252 D/w Dr. Timmy who agrees that patient can be given IV abx and discharged w/ close o/p f/u if she is feeling well. Platelets are a bit low, but PT/INR are wnl. Doubt DIC. Hgb is stable from prior, doubt TTP/HUS.  [HN]  2254 Blood cultures are pending, patient is asymptomatic and ready to be discharged. Advised to f/u with oncology and have labs rechecked within 1 week. She will be notified if blood cultures positive. DC w/ discharge instructions/return precautions. All questions answered to patient's satisfaction.   [HN]    Clinical Course User Index [HN] Franklyn Sid SAILOR, MD    Labs: I Ordered, and personally interpreted labs.  The pertinent results include: Those listed above  Imaging Studies ordered: I ordered imaging studies including chest x-ray I independently visualized and interpreted imaging. I agree with the radiologist interpretation  Additional history obtained from chart review.    Reevaluation: After the interventions noted above, I reevaluated the patient and found that they have :resolved  Social Determinants of Health:  lives independently  Disposition: Discharge home  Co morbidities that complicate the patient evaluation  Past Medical History:  Diagnosis Date   Abdominal  hernia    Anemia    b12 deficiency   Anxiety    Arthritis    B12 deficiency    Back pain    Cancer (HCC)    breast cancer   Depression    Family history of adverse reaction to anesthesia  mother vomits after anesthesia   Gallbladder problem    GERD (gastroesophageal reflux disease)    History of hiatal hernia    small per patient   Hypertension    Hypothyroidism    Joint pain    Knee pain    Palpitation    when iron is low   Pernicious anemia    SOB (shortness of breath)    Thyroid  disease    hypothyroidism   Vitamin D  deficiency      Medicines Meds ordered this encounter  Medications   acetaminophen  (TYLENOL ) tablet 650 mg   DISCONTD: lactated ringers  infusion    I have reviewed the patients home medicines and have made adjustments as needed  Problem List / ED Course: Problem List Items Addressed This Visit   None Visit Diagnoses       Drug-induced fever    -  Primary     Thrombocytopenia                       This note was created using dictation software, which may contain spelling or grammatical errors.    Franklyn Sid SAILOR, MD 03/09/24 1000

## 2024-02-29 NOTE — ED Provider Triage Note (Signed)
 Emergency Medicine Provider Triage Evaluation Note  Diamond PIPPEN , a 53 y.o. female  was evaluated in triage.  Pt complains of fever with Tmax of 103 as well as nausea and vomiting.  Reports that she had her most recent cycle of chemo on 10/7.  Reports that she typically will have a similar presentation of her chemo is not coadministered with prednisone  where she develops fever, nausea, vomiting 2 to 3 days after chemo.  Reports that she has metastatic breast cancer and is currently on her 22nd cycle of chemo.  Reports that after receiving Tylenol  her nausea, vomiting, fever resolved and she feels back to baseline.  Review of Systems  Positive: Fever, nausea, vomiting Negative: Abdominal pain  Physical Exam  BP (!) 161/88 (BP Location: Right Wrist)   Pulse (!) 107   Temp 99.4 F (37.4 C) (Oral)   Resp 16   LMP 10/13/2019 (Approximate)   SpO2 96%  Gen:   Awake, no distress   Resp:  Normal effort  MSK:   Moves extremities without difficulty  Other:  Abdomen soft, nontender  Medical Decision Making  Medically screening exam initiated at 6:59 PM.  Appropriate orders placed.  MELVINE JULIN was informed that the remainder of the evaluation will be completed by another provider, this initial triage assessment does not replace that evaluation, and the importance of remaining in the ED until their evaluation is complete.     Rogelia Jerilynn RAMAN, MD 02/29/24 RETHA

## 2024-03-03 ENCOUNTER — Telehealth: Payer: Self-pay

## 2024-03-03 MED ORDER — DEXAMETHASONE 4 MG PO TABS: ORAL_TABLET | ORAL | 6 refills | Status: DC

## 2024-03-03 MED ORDER — DEXAMETHASONE 4 MG PO TABS: ORAL_TABLET | ORAL | 6 refills | Status: AC

## 2024-03-03 NOTE — Progress Notes (Signed)
 Pt scheduled for echo at [facility] 03/13/24 at 1300. Pt is aware.

## 2024-03-03 NOTE — Telephone Encounter (Signed)
 Pt called and LVM asking for call back. She states she had to go to the hospital Friday night because her platelets dropped and she had a high fever. She is requesting that Dr Loretha order steroids for her post tx as she noticed she did not have side effects after chemo when she was taking medrol  dosePAK. Per MD, order placed for Decadron  4 mg 1 table by mouth for 3 days starting the day after chemo. Pt has also been r/s for her echo 10/23 at 1300. She is agreeable to this.

## 2024-03-04 ENCOUNTER — Other Ambulatory Visit: Payer: Self-pay | Admitting: Hematology and Oncology

## 2024-03-04 ENCOUNTER — Encounter: Payer: Self-pay | Admitting: Hematology and Oncology

## 2024-03-04 DIAGNOSIS — E876 Hypokalemia: Secondary | ICD-10-CM

## 2024-03-05 LAB — CULTURE, BLOOD (ROUTINE X 2)
Culture: NO GROWTH
Culture: NO GROWTH
Special Requests: ADEQUATE
Special Requests: ADEQUATE

## 2024-03-13 ENCOUNTER — Ambulatory Visit (HOSPITAL_COMMUNITY)
Admission: RE | Admit: 2024-03-13 | Discharge: 2024-03-13 | Disposition: A | Discharge status: Home / Self Care | Source: Ambulatory Visit | Attending: Hematology and Oncology | Admitting: Hematology and Oncology

## 2024-03-13 DIAGNOSIS — Z170 Estrogen receptor positive status [ER+]: Secondary | ICD-10-CM | POA: Diagnosis not present

## 2024-03-13 DIAGNOSIS — Z0189 Encounter for other specified special examinations: Secondary | ICD-10-CM

## 2024-03-13 DIAGNOSIS — C50212 Malignant neoplasm of upper-inner quadrant of left female breast: Secondary | ICD-10-CM | POA: Insufficient documentation

## 2024-03-13 LAB — ECHOCARDIOGRAM COMPLETE
AR max vel: 1.68 cm2
AV Area VTI: 1.62 cm2
AV Area mean vel: 1.56 cm2
AV Mean grad: 8 mmHg
AV Peak grad: 13.1 mmHg
Ao pk vel: 1.81 m/s
Area-P 1/2: 3.12 cm2
S' Lateral: 2.6 cm

## 2024-03-13 NOTE — Progress Notes (Signed)
*  PRELIMINARY RESULTS* Echocardiogram 2D Echocardiogram has been performed.  Diamond Marshall 03/13/2024, 2:07 PM

## 2024-03-17 ENCOUNTER — Other Ambulatory Visit: Payer: Self-pay | Admitting: *Deleted

## 2024-03-17 DIAGNOSIS — Z170 Estrogen receptor positive status [ER+]: Secondary | ICD-10-CM

## 2024-03-18 ENCOUNTER — Encounter: Payer: Self-pay | Admitting: Adult Health

## 2024-03-18 ENCOUNTER — Inpatient Hospital Stay

## 2024-03-18 ENCOUNTER — Encounter: Payer: Self-pay | Admitting: Hematology and Oncology

## 2024-03-18 ENCOUNTER — Other Ambulatory Visit: Payer: Self-pay

## 2024-03-18 ENCOUNTER — Inpatient Hospital Stay (HOSPITAL_BASED_OUTPATIENT_CLINIC_OR_DEPARTMENT_OTHER): Admitting: Adult Health

## 2024-03-18 VITALS — BP 125/63 | Temp 97.7°F | Ht 61.0 in

## 2024-03-18 VITALS — BP 126/70 | HR 90 | Resp 18 | Wt 267.5 lb

## 2024-03-18 DIAGNOSIS — Z95828 Presence of other vascular implants and grafts: Secondary | ICD-10-CM

## 2024-03-18 DIAGNOSIS — Z5112 Encounter for antineoplastic immunotherapy: Secondary | ICD-10-CM | POA: Diagnosis not present

## 2024-03-18 DIAGNOSIS — C50212 Malignant neoplasm of upper-inner quadrant of left female breast: Secondary | ICD-10-CM

## 2024-03-18 DIAGNOSIS — Z170 Estrogen receptor positive status [ER+]: Secondary | ICD-10-CM | POA: Diagnosis not present

## 2024-03-18 LAB — CBC WITH DIFFERENTIAL (CANCER CENTER ONLY)
Abs Immature Granulocytes: 0.09 K/uL — ABNORMAL HIGH (ref 0.00–0.07)
Basophils Absolute: 0.1 K/uL (ref 0.0–0.1)
Basophils Relative: 1 %
Eosinophils Absolute: 0.2 K/uL (ref 0.0–0.5)
Eosinophils Relative: 2 %
HCT: 29 % — ABNORMAL LOW (ref 36.0–46.0)
Hemoglobin: 8.8 g/dL — ABNORMAL LOW (ref 12.0–15.0)
Immature Granulocytes: 1 %
Lymphocytes Relative: 25 %
Lymphs Abs: 2.2 K/uL (ref 0.7–4.0)
MCH: 22.1 pg — ABNORMAL LOW (ref 26.0–34.0)
MCHC: 30.3 g/dL (ref 30.0–36.0)
MCV: 72.7 fL — ABNORMAL LOW (ref 80.0–100.0)
Monocytes Absolute: 0.6 K/uL (ref 0.1–1.0)
Monocytes Relative: 7 %
Neutro Abs: 5.8 K/uL (ref 1.7–7.7)
Neutrophils Relative %: 64 %
Platelet Count: 172 K/uL (ref 150–400)
RBC: 3.99 MIL/uL (ref 3.87–5.11)
RDW: 21.6 % — ABNORMAL HIGH (ref 11.5–15.5)
WBC Count: 8.9 K/uL (ref 4.0–10.5)
nRBC: 0 % (ref 0.0–0.2)

## 2024-03-18 LAB — CMP (CANCER CENTER ONLY)
ALT: 18 U/L (ref 0–44)
AST: 33 U/L (ref 15–41)
Albumin: 3.1 g/dL — ABNORMAL LOW (ref 3.5–5.0)
Alkaline Phosphatase: 148 U/L — ABNORMAL HIGH (ref 38–126)
Anion gap: 5 (ref 5–15)
BUN: 12 mg/dL (ref 6–20)
CO2: 27 mmol/L (ref 22–32)
Calcium: 8.4 mg/dL — ABNORMAL LOW (ref 8.9–10.3)
Chloride: 108 mmol/L (ref 98–111)
Creatinine: 0.64 mg/dL (ref 0.44–1.00)
GFR, Estimated: >60 mL/min (ref >60)
Glucose, Bld: 112 mg/dL — ABNORMAL HIGH (ref 70–99)
Potassium: 3.4 mmol/L — ABNORMAL LOW (ref 3.5–5.1)
Sodium: 140 mmol/L (ref 135–145)
Total Bilirubin: 0.9 mg/dL (ref 0.0–1.2)
Total Protein: 7.3 g/dL (ref 6.5–8.1)

## 2024-03-18 LAB — IRON AND IRON BINDING CAPACITY (CC-WL,HP ONLY)
Iron: 21 ug/dL — ABNORMAL LOW (ref 28–170)
Saturation Ratios: 5 % — ABNORMAL LOW (ref 10.4–31.8)
TIBC: 431 ug/dL (ref 250–450)
UIBC: 410 ug/dL (ref 148–442)

## 2024-03-18 LAB — FERRITIN: Ferritin: 31 ng/mL (ref 11–307)

## 2024-03-18 LAB — VITAMIN B12: Vitamin B-12: 668 pg/mL (ref 180–914)

## 2024-03-18 MED ORDER — SODIUM CHLORIDE 0.9 % IV SOLN: INTRAVENOUS | Status: DC

## 2024-03-18 MED ORDER — ACETAMINOPHEN 325 MG PO TABS
650.0000 mg | ORAL_TABLET | Freq: Once | ORAL | Status: AC
  Administered 2024-03-18: 650 mg via ORAL
  Filled 2024-03-18: qty 2

## 2024-03-18 MED ORDER — SODIUM CHLORIDE 0.9% FLUSH
10.0000 mL | INTRAVENOUS | Status: DC | PRN
  Administered 2024-03-18: 10 mL

## 2024-03-18 MED ORDER — PROCHLORPERAZINE MALEATE 10 MG PO TABS: 10.0000 mg | ORAL_TABLET | Freq: Once | ORAL | Status: DC

## 2024-03-18 MED ORDER — SODIUM CHLORIDE 0.9 % IV SOLN
3.0000 mg/kg | Freq: Once | INTRAVENOUS | Status: AC
  Administered 2024-03-18: 400 mg via INTRAVENOUS
  Filled 2024-03-18: qty 20

## 2024-03-18 MED ORDER — LORATADINE 10 MG PO TABS
10.0000 mg | ORAL_TABLET | Freq: Once | ORAL | Status: AC
  Administered 2024-03-18: 10 mg via ORAL
  Filled 2024-03-18: qty 1

## 2024-03-18 MED ORDER — SODIUM CHLORIDE 0.9% FLUSH
10.0000 mL | Freq: Once | INTRAVENOUS | Status: AC
  Administered 2024-03-18: 10 mL

## 2024-03-18 MED ORDER — DICLOFENAC SODIUM 3 % EX GEL: 1.0000 | Freq: Two times a day (BID) | CUTANEOUS | 0 refills | Status: AC | PRN

## 2024-03-18 NOTE — Progress Notes (Signed)
 [facility] Cancer Follow up:    Diamond Lowers, MD [address] [place] [address]   DIAGNOSIS: Cancer Staging  Malignant neoplasm of upper-inner quadrant of left breast in female, estrogen receptor positive (HCC) Staging form: Breast, AJCC 8th Edition - Clinical stage from 06/21/2022: Stage IV (cT3, cN3, cM1, G3, ER+, PR+, HER2+) - Signed by [name], MD on 09/17/2023 Stage prefix: Initial diagnosis Histologic grading system: 3 grade system Stage used in treatment planning: Yes National guidelines used in treatment planning: Yes Type of national guideline used in treatment planning: NCCN    SUMMARY OF ONCOLOGIC HISTORY: Oncology History  Malignant neoplasm of upper-inner quadrant of left breast in female, estrogen receptor positive (HCC)  06/06/2022 Mammogram   Diagnostic mammogram given palpable mass showed suspicious spiculated left breast mass with surrounding nodularity at 9 o'clock position.  This measures up to 3.1 cm sonographically however due to deep location and patient body habitus the mammographic measurement of 5.8 cm is felt to be more accurate.  At least 2 abnormal lymph nodes in the left axilla.  Possible abnormal palpable left supraclavicular lymph node.   06/06/2022 Breast US    Breast ultrasound confirmed these findings.   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  Prognostic showed ER 65% weak to strong staining, PR 15% weak to moderate staining, Ki-67 of 50% and HER2 positive by IHC at 3+    Genetic Testing   [facility] Custom Panel+RNA was Negative. Report date is 06/28/2022.   The Custom Hereditary Cancers Panel offered by [facility] includes sequencing and/or deletion duplication testing of the following 43 genes: APC, ATM, AXIN2, BAP1, BARD1, BMPR1A, BRCA1, BRCA2, BRIP1, CDH1,  CDK4, CDKN2A (p14ARF and p16INK4a only), CHEK2, CTNNA1, EPCAM (Deletion/duplication testing only), FH, GREM1 (promoter region duplication testing only), HOXB13, KIT, MBD4, MEN1, MLH1, MSH2, MSH3, MSH6, MUTYH, NF1, NHTL1, PALB2, PDGFRA, PMS2, POLD1, POLE, PTEN, RAD51C, RAD51D, SMAD4, SMARCA4. STK11, TP53, TSC1, TSC2, and VHL.    06/21/2022 Cancer Staging   Staging form: Breast, AJCC 8th Edition - Clinical stage from 06/21/2022: Stage IV (cT3, cN3, cM1, G3, ER+, PR+, HER2+) - Signed by [name], MD on 09/17/2023 Stage prefix: Initial diagnosis Histologic grading system: 3 grade system Stage used in treatment planning: Yes National guidelines used in treatment planning: Yes Type of national guideline used in treatment planning: NCCN   06/29/2022 PET scan   IMPRESSION: 1. Hypermetabolic left breast mass, compatible with primary breast malignancy. 2. Hypermetabolic left cervical, axillary, subpectoral, supraclavicular and prevascular mediastinal lymph nodes, compatible with nodal metastatic disease. 3. No evidence of metastatic disease in the abdomen or pelvis. 4. Aortic Atherosclerosis (ICD10-I70.0).     Electronically Signed   By: [name] M.D.   On: 06/29/2022 09:32   07/11/2022 - 01/16/2023 Chemotherapy   Patient is on Treatment Plan : BREAST DOCEtaxel  + Trastuzumab  + Pertuzumab  (THP) q21d x 8 cycles / Trastuzumab  + Pertuzumab  q21d x 4 cycles     02/01/2023 - 06/29/2023 Chemotherapy   Patient is on Treatment Plan : BREAST METASTATIC Fam-Trastuzumab Deruxtecan-nxki  (Enhertu ) (5.4) q21d     10/02/2023 -  Chemotherapy   Patient is on Treatment Plan : BREAST ADO-Trastuzumab Emtansine  (Kadcyla ) q21d       CURRENT THERAPY:Kadcyla   INTERVAL HISTORY:  Discussed the use of AI scribe software for clinical note transcription with the  patient, who gave verbal consent to proceed.  History of Present Illness Diamond Marshall is a 53 year old female with metastatic breast cancer who  presents for follow-up and evaluation on treatment with Kadcyla .  She experiences fatigue, which she attributes to Kadcyla  treatment, and takes a steroid post-infusion. She reports pain under her arm, described as tender and occasionally throbbing, present for a couple of days, with concerns about potential lymphedema but no visible difference in arm size. Her hemoglobin level is 8.8, and she takes iron pills intermittently due to constipation.     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   Breast cancer, stage 4 (HCC) 07/26/2023   Gastroenteritis 09/29/2022   Port-A-Cath in place 07/07/2022   Genetic testing 06/29/2022   Malignant neoplasm of upper-inner quadrant of left breast in female, estrogen receptor positive (HCC) 06/19/2022   Chronic pain of right knee 03/27/2022   Essential hypertension 03/27/2022   Positive self-administered antigen test for COVID-19 03/15/2021   Prediabetes 07/22/2019   Anxiety and depression 03/15/2016   Vitamin D  deficiency 03/15/2016   Metabolic syndrome X 08/07/2013   Elevated random blood glucose level 08/07/2013   Hypertriglyceridemia 08/07/2013   ADD (attention deficit disorder) 07/24/2013    hyperlipidemia 10/09/2012   Morbid obesity (HCC) 10/09/2012   Hypothyroidism 10/09/2012   B12 deficiency 10/09/2012   Anemia, unspecified 02/12/2011   Dysthymic disorder 02/12/2011   Migraine headache 02/12/2011   Obsessive-compulsive disorder 02/12/2011   Tinnitus 02/12/2011    is allergic to phentermine , bee venom, lactose intolerance (gi), peanut allergen powder-dnfp, topiramate , sulfa antibiotics, tape, and wound dressing adhesive.  MEDICAL HISTORY: Past Medical History:  Diagnosis Date   Abdominal hernia    Anemia    b12  deficiency   Anxiety    Arthritis    B12 deficiency    Back pain    Cancer (HCC)    breast cancer   Depression    Family history of adverse reaction to anesthesia    mother vomits after anesthesia   Gallbladder problem    GERD (gastroesophageal reflux disease)    History of hiatal hernia    small per patient   Hypertension    Hypothyroidism    Joint pain    Knee pain    Palpitation    when iron is low   Pernicious anemia    SOB (shortness of breath)    Thyroid  disease    hypothyroidism   Vitamin D  deficiency     SURGICAL HISTORY: Past Surgical History:  Procedure Laterality Date   BREAST BIOPSY Left 06/09/2022   US  LT BREAST BX W LOC DEV 1ST LESION IMG BX SPEC US  GUIDE 06/09/2022 [facility] MAMMOGRAPHY   CESAREAN SECTION  1999   Dilatation and currettagement     for miscarriage 18 years ago   PORTACATH PLACEMENT Right 07/05/2022   Procedure: INSERTION PORT-A-CATH;  Surgeon: [name], MD;  Location: [facility] OR;  Service: General;  Laterality: Right;   SIMPLE MASTECTOMY WITH AXILLARY SENTINEL NODE BIOPSY Bilateral 07/26/2023   Procedure: LEFT SKIN REDUCING MASTECTOMY, RIGHT SKIN SPARING, RISK REDUCING MASTECTOMY;  Surgeon: [name], MD;  Location: [facility] OR;  Service: General;  Laterality: Bilateral;  PEC BLOCK    SOCIAL HISTORY: Social History   Socioeconomic History   Marital status: Married  Spouse name: Garrel   Number of children: Not on file   Years of education: Not on file   Highest education level: Not on file  Occupational History   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  Tobacco Use   Smoking status: Never   Smokeless tobacco: Never  Vaping Use   Vaping status: Never Used  Substance and Sexual Activity   Alcohol use: No   Drug use: No   Sexual activity: Yes    Birth control/protection: Pill  Other Topics Concern   Not on file  Social History Narrative   Not on file   Social Drivers of Health   Financial Resource Strain: Not on file  Food  Insecurity: No Food Insecurity (10/09/2023)   Hunger Vital Sign    Worried About Running Out of Food in the Last Year: Never true    Ran Out of Food in the Last Year: Never true  Transportation Needs: No Transportation Needs (10/09/2023)   PRAPARE - Administrator, Civil Service (Medical): No    Lack of Transportation (Non-Medical): No  Physical Activity: Not on file  Stress: Not on file  Social Connections: Moderately Integrated (07/26/2023)   Social Connection and Isolation Panel    Frequency of Communication with Friends and Family: Three times a week    Frequency of Social Gatherings with Friends and Family: Three times a week    Attends Religious Services: More than 4 times per year    Active Member of Clubs or Organizations: No    Attends Banker Meetings: Never    Marital Status: Married  Catering Manager Violence: Not At Risk (10/09/2023)   Humiliation, Afraid, Rape, and Kick questionnaire    Fear of Current or Ex-Partner: No    Emotionally Abused: No    Physically Abused: No    Sexually Abused: No    FAMILY HISTORY: Family History  Problem Relation Age of Onset   Hypertension Mother    Diabetes Mother    High Cholesterol Mother    Thyroid  disease Mother    Depression Mother    Anxiety disorder Mother    Obesity Mother    CVA Father        hemorrhagic   Ovarian cancer Maternal Aunt 69   Colon cancer Maternal Aunt    Breast cancer Cousin 32       maternal first cousin, reports negative genetic testing    Review of Systems  Constitutional:  Positive for fatigue. Negative for appetite change, chills, fever and unexpected weight change.  HENT:   Negative for hearing loss, lump/mass and trouble swallowing.   Eyes:  Negative for eye problems and icterus.  Respiratory:  Negative for chest tightness, cough and shortness of breath.   Cardiovascular:  Negative for chest pain, leg swelling and palpitations.  Gastrointestinal:  Negative for abdominal  distention, abdominal pain, constipation, diarrhea, nausea and vomiting.  Endocrine: Negative for hot flashes.  Genitourinary:  Negative for difficulty urinating.   Musculoskeletal:  Negative for arthralgias.  Skin:  Negative for itching and rash.  Neurological:  Negative for dizziness, extremity weakness, headaches and numbness.  Hematological:  Negative for adenopathy. Does not bruise/bleed easily.  Psychiatric/Behavioral:  Negative for depression. The patient is not nervous/anxious.       PHYSICAL EXAMINATION    Vitals:   03/18/24 1410  BP: 125/63  Temp: 97.7 F (36.5 C)  SpO2: 96%    Physical Exam Constitutional:      General: She is not  in acute distress.    Appearance: Normal appearance. She is not toxic-appearing.  HENT:     Head: Normocephalic and atraumatic.     Mouth/Throat:     Mouth: Mucous membranes are moist.     Pharynx: Oropharynx is clear. No oropharyngeal exudate or posterior oropharyngeal erythema.  Eyes:     General: No scleral icterus. Cardiovascular:     Rate and Rhythm: Normal rate and regular rhythm.     Pulses: Normal pulses.     Heart sounds: Normal heart sounds.  Pulmonary:     Effort: Pulmonary effort is normal.     Breath sounds: Normal breath sounds.  Abdominal:     General: Abdomen is flat. Bowel sounds are normal. There is no distension.     Palpations: Abdomen is soft.     Tenderness: There is no abdominal tenderness.  Musculoskeletal:        General: No swelling.     Cervical back: Neck supple.  Lymphadenopathy:     Cervical: No cervical adenopathy.  Skin:    General: Skin is warm and dry.     Findings: No rash.  Neurological:     General: No focal deficit present.     Mental Status: She is alert.  Psychiatric:        Mood and Affect: Mood normal.        Behavior: Behavior normal.     LABORATORY DATA:  CBC    Component Value Date/Time   WBC 6.6 02/29/2024 1922   RBC 4.48 02/29/2024 1922   HGB 9.6 (L) 02/29/2024  1922   HGB 9.2 (L) 02/26/2024 1003   HGB 12.0 07/21/2021 1511   HCT 32.9 (L) 02/29/2024 1922   HCT 37.6 07/21/2021 1511   PLT 123 (L) 02/29/2024 1922   PLT 145 (L) 02/26/2024 1003   PLT 359 07/21/2021 1511   MCV 73.4 (L) 02/29/2024 1922   MCV 76 (L) 07/21/2021 1511   MCH 21.4 (L) 02/29/2024 1922   MCHC 29.2 (L) 02/29/2024 1922   RDW 21.7 (H) 02/29/2024 1922   RDW 15.7 (H) 07/21/2021 1511   LYMPHSABS 1.1 02/29/2024 1922   LYMPHSABS 1.9 07/21/2021 1511   MONOABS 0.4 02/29/2024 1922   EOSABS 0.2 02/29/2024 1922   EOSABS 0.2 07/21/2021 1511   BASOSABS 0.0 02/29/2024 1922   BASOSABS 0.1 07/21/2021 1511    CMP     Component Value Date/Time   NA 135 02/29/2024 1922   NA 139 03/28/2022 0811   K 3.5 02/29/2024 1922   CL 99 02/29/2024 1922   CO2 25 02/29/2024 1922   GLUCOSE 89 02/29/2024 1922   BUN 12 02/29/2024 1922   BUN 14 03/28/2022 0811   CREATININE 0.68 02/29/2024 1922   CREATININE 0.49 02/26/2024 1003   CREATININE 0.74 10/09/2012 1537   CALCIUM 9.1 02/29/2024 1922   PROT 7.7 02/29/2024 1922   PROT 7.1 03/28/2022 0811   ALBUMIN 3.4 (L) 02/29/2024 1922   ALBUMIN 4.2 03/28/2022 0811   AST 61 (H) 02/29/2024 1922   AST 26 02/26/2024 1003   ALT 27 02/29/2024 1922   ALT 20 02/26/2024 1003   ALKPHOS 188 (H) 02/29/2024 1922   BILITOT 1.0 02/29/2024 1922   BILITOT 0.9 02/26/2024 1003   GFRNONAA >60 02/29/2024 1922   GFRNONAA >60 02/26/2024 1003   GFRNONAA >89 10/09/2012 1537   GFRAA 96 05/18/2020 1442   GFRAA >89 10/09/2012 1537     ASSESSMENT and THERAPY PLAN:   No problem-specific Assessment & Plan  notes found for this encounter. Assessment and Plan Assessment & Plan Metastatic breast cancer, left upper-inner quadrant Currently on Kadcyla  with fatigue as the only significant adverse reaction. Echocardiogram normal. Tenderness under left arm, likely muscle inflammation or costochondritis. No lymphedema. - Continue Kadcyla  treatment. - Administer anti-inflammatory  gel for tenderness under the left arm. - Provide a letter for [employer] accommodations due to cancer treatment. - Perform Guardant reveal testing for circulating tumor DNA. - Consider imaging if circulating tumor DNA is detected.  Pain under left arm Tenderness under the left arm, likely muscle inflammation or costochondritis. No lymphedema. - Administer anti-inflammatory gel for pain under the left arm. - Consider ultrasound if pain worsens.  Anemia in neoplastic disease Anemia with hemoglobin at 8.8 g/dL, contributing to fatigue. Inconsistent iron supplementation due to constipation. - Check iron levels. - Administer IV iron if needed after the trip.  Fatigue related to cancer and anemia Fatigue likely due to cancer treatment and anemia. Hemoglobin at 8.8 g/dL. - Check iron levels and consider IV iron if needed after the trip.  RTC as noted above.  All questions were answered. The patient knows to call the clinic with any problems, questions or concerns. We can certainly see the patient much sooner if necessary.  Total encounter time:30 minutes*in face-to-face visit time, chart review, lab review, care coordination, order entry, and documentation of the encounter time.    Morna Kendall, NP 03/18/24 2:14 PM Medical Oncology and Hematology [facility] [address] [place], [place] [address] Tel. [phone]    Fax. [phone]  *Total Encounter Time as defined by the Centers for Medicare and Medicaid Services includes, in addition to the face-to-face time of a patient visit (documented in the note above) non-face-to-face time: obtaining and reviewing outside history, ordering and reviewing medications, tests or procedures, care coordination (communications with other health care professionals or caregivers) and documentation in the medical record.

## 2024-03-18 NOTE — Patient Instructions (Signed)
 [facility] - A DEPT OF [facility]  Discharge Instructions: Thank you for choosing [facility] to provide your oncology and hematology care.   If you have a lab appointment with the Cancer Center, please go directly to the Cancer Center and check in at the registration area.   Wear comfortable clothing and clothing appropriate for easy access to any Portacath or PICC line.   We strive to give you quality time with your provider. You may need to reschedule your appointment if you arrive late (15 or more minutes).  Arriving late affects you and other patients whose appointments are after yours.  Also, if you miss three or more appointments without notifying the office, you may be dismissed from the clinic at the provider's discretion.      For prescription refill requests, have your pharmacy contact our office and allow 72 hours for refills to be completed.    Today you received the following chemotherapy and/or immunotherapy agent: ado-Trastuzumab  (Kadcyla )   To help prevent nausea and vomiting after your treatment, we encourage you to take your nausea medication as directed.  BELOW ARE SYMPTOMS THAT SHOULD BE REPORTED IMMEDIATELY: *FEVER GREATER THAN 100.4 F (38 C) OR HIGHER *CHILLS OR SWEATING *NAUSEA AND VOMITING THAT IS NOT CONTROLLED WITH YOUR NAUSEA MEDICATION *UNUSUAL SHORTNESS OF BREATH *UNUSUAL BRUISING OR BLEEDING *URINARY PROBLEMS (pain or burning when urinating, or frequent urination) *BOWEL PROBLEMS (unusual diarrhea, constipation, pain near the anus) TENDERNESS IN MOUTH AND THROAT WITH OR WITHOUT PRESENCE OF ULCERS (sore throat, sores in mouth, or a toothache) UNUSUAL RASH, SWELLING OR PAIN  UNUSUAL VAGINAL DISCHARGE OR ITCHING   Items with * indicate a potential emergency and should be followed up as soon as possible or go to the Emergency Department if any problems should occur.  Please show the CHEMOTHERAPY ALERT CARD or  IMMUNOTHERAPY ALERT CARD at check-in to the Emergency Department and triage nurse.  Should you have questions after your visit or need to cancel or reschedule your appointment, please contact [facility] - A DEPT OF [facility]  Dept: [phone]  and follow the prompts.  Office hours are 8:00 a.m. to 4:30 p.m. Monday - Friday. Please note that voicemails left after 4:00 p.m. may not be returned until the following business day.  We are closed weekends and major holidays. You have access to a nurse at all times for urgent questions. Please call the main number to the clinic Dept: [phone] and follow the prompts.   For any non-urgent questions, you may also contact your provider using MyChart. We now offer e-Visits for anyone 70 and older to request care online for non-urgent symptoms. For details visit [email or web address].   Also download the MyChart app! Go to the app store, search MyChart, open the app, select [facility], and log in with your MyChart username and password.

## 2024-03-18 NOTE — Progress Notes (Signed)
 Continuation auth for Kadcyla  still pending, okay to proceed with treatment today per Darlena Clark.  Harlene Nasuti, PharmD Oncology Infusion Pharmacist 03/18/2024 3:17 PM

## 2024-03-20 ENCOUNTER — Encounter: Payer: Self-pay | Admitting: Hematology and Oncology

## 2024-03-24 ENCOUNTER — Ambulatory Visit: Payer: Self-pay

## 2024-03-26 ENCOUNTER — Encounter: Payer: Self-pay | Admitting: Adult Health

## 2024-03-27 LAB — GUARDANT REVEAL

## 2024-03-30 ENCOUNTER — Other Ambulatory Visit: Payer: Self-pay

## 2024-03-31 ENCOUNTER — Other Ambulatory Visit (HOSPITAL_COMMUNITY)

## 2024-03-31 ENCOUNTER — Telehealth: Payer: Self-pay

## 2024-03-31 ENCOUNTER — Encounter: Payer: Self-pay | Admitting: Hematology and Oncology

## 2024-03-31 NOTE — Telephone Encounter (Signed)
 S/w patient regarding recent myChart message. Patient noting new cold after recent trip to [place].  Patient reports previously having a fever, but has been afebrile today with last recorded temp at 98.6 F. Patient is taking cold/flu medication with tylenol  to help with symptoms. Interventions providing relief. Patient denies any chills or shortness of breath at this time. Patient has not been tested for flu/step/covid. Patient declined to be tested at this time.  Strict ED precautions reviewed with patient. She knows to contact the office should she have a fever of 100.4 or greater, starts to experience chills, and/or if she experiences any shortness of breath.  Patient verbalized an understanding of the information.

## 2024-04-01 ENCOUNTER — Other Ambulatory Visit: Payer: Self-pay | Admitting: Hematology and Oncology

## 2024-04-01 DIAGNOSIS — D509 Iron deficiency anemia, unspecified: Secondary | ICD-10-CM | POA: Insufficient documentation

## 2024-04-02 ENCOUNTER — Encounter: Payer: Self-pay | Admitting: Family Medicine

## 2024-04-02 ENCOUNTER — Ambulatory Visit (HOSPITAL_COMMUNITY)
Admission: RE | Admit: 2024-04-02 | Discharge: 2024-04-02 | Disposition: A | Discharge status: Home / Self Care | Source: Ambulatory Visit | Attending: Hematology and Oncology | Admitting: Hematology and Oncology

## 2024-04-02 DIAGNOSIS — Z170 Estrogen receptor positive status [ER+]: Secondary | ICD-10-CM | POA: Diagnosis present

## 2024-04-02 DIAGNOSIS — C50212 Malignant neoplasm of upper-inner quadrant of left female breast: Secondary | ICD-10-CM | POA: Diagnosis present

## 2024-04-02 DIAGNOSIS — G5623 Lesion of ulnar nerve, bilateral upper limbs: Secondary | ICD-10-CM

## 2024-04-02 MED ORDER — HEPARIN SOD (PORK) LOCK FLUSH 100 UNIT/ML IV SOLN
INTRAVENOUS | Status: AC
  Filled 2024-04-02: qty 5

## 2024-04-02 MED ORDER — IOHEXOL 300 MG/ML  SOLN
100.0000 mL | Freq: Once | INTRAMUSCULAR | Status: AC | PRN
  Administered 2024-04-02: 100 mL via INTRAVENOUS

## 2024-04-04 ENCOUNTER — Telehealth: Payer: Self-pay

## 2024-04-04 NOTE — Telephone Encounter (Signed)
 Dr. Loretha, patient will be scheduled as soon as possible.  Auth Submission: NO AUTH NEEDED Site of care: Site of care: [facility] Payer: [employer]HC/[employer] Medication & CPT/J Code(s) submitted: Feraheme (ferumoxytol) [id number] Diagnosis Code:  Route of submission (phone, fax, portal): portal Phone # Fax # Auth type: Buy/Bill PB Units/visits requested: 510mg  x 2 doses Reference number: [id number] [phone] Approval from: 04/04/24 to 05/21/24

## 2024-04-08 ENCOUNTER — Encounter: Payer: Self-pay | Admitting: Hematology and Oncology

## 2024-04-08 ENCOUNTER — Inpatient Hospital Stay

## 2024-04-08 ENCOUNTER — Inpatient Hospital Stay (HOSPITAL_BASED_OUTPATIENT_CLINIC_OR_DEPARTMENT_OTHER): Admitting: Hematology and Oncology

## 2024-04-08 ENCOUNTER — Inpatient Hospital Stay: Attending: Hematology and Oncology

## 2024-04-08 VITALS — BP 128/80 | HR 79 | Resp 14

## 2024-04-08 VITALS — BP 137/66 | HR 88 | Temp 97.3°F | Resp 17 | Wt 268.0 lb

## 2024-04-08 DIAGNOSIS — R5383 Other fatigue: Secondary | ICD-10-CM | POA: Insufficient documentation

## 2024-04-08 DIAGNOSIS — Z170 Estrogen receptor positive status [ER+]: Secondary | ICD-10-CM

## 2024-04-08 DIAGNOSIS — Z1721 Progesterone receptor positive status: Secondary | ICD-10-CM | POA: Insufficient documentation

## 2024-04-08 DIAGNOSIS — D509 Iron deficiency anemia, unspecified: Secondary | ICD-10-CM

## 2024-04-08 DIAGNOSIS — Z803 Family history of malignant neoplasm of breast: Secondary | ICD-10-CM | POA: Insufficient documentation

## 2024-04-08 DIAGNOSIS — Z8041 Family history of malignant neoplasm of ovary: Secondary | ICD-10-CM | POA: Diagnosis not present

## 2024-04-08 DIAGNOSIS — Z5112 Encounter for antineoplastic immunotherapy: Secondary | ICD-10-CM | POA: Diagnosis present

## 2024-04-08 DIAGNOSIS — C50212 Malignant neoplasm of upper-inner quadrant of left female breast: Secondary | ICD-10-CM

## 2024-04-08 DIAGNOSIS — J984 Other disorders of lung: Secondary | ICD-10-CM | POA: Diagnosis not present

## 2024-04-08 DIAGNOSIS — Z1731 Human epidermal growth factor receptor 2 positive status: Secondary | ICD-10-CM | POA: Diagnosis not present

## 2024-04-08 DIAGNOSIS — Z95828 Presence of other vascular implants and grafts: Secondary | ICD-10-CM

## 2024-04-08 LAB — CMP (CANCER CENTER ONLY)
ALT: 16 U/L (ref 0–44)
AST: 34 U/L (ref 15–41)
Albumin: 3.3 g/dL — ABNORMAL LOW (ref 3.5–5.0)
Alkaline Phosphatase: 152 U/L — ABNORMAL HIGH (ref 38–126)
Anion gap: 6 (ref 5–15)
BUN: 8 mg/dL (ref 6–20)
CO2: 28 mmol/L (ref 22–32)
Calcium: 8.9 mg/dL (ref 8.9–10.3)
Chloride: 104 mmol/L (ref 98–111)
Creatinine: 0.54 mg/dL (ref 0.44–1.00)
GFR, Estimated: >60 mL/min (ref >60)
Glucose, Bld: 101 mg/dL — ABNORMAL HIGH (ref 70–99)
Potassium: 3.7 mmol/L (ref 3.5–5.1)
Sodium: 138 mmol/L (ref 135–145)
Total Bilirubin: 1 mg/dL (ref 0.0–1.2)
Total Protein: 7.5 g/dL (ref 6.5–8.1)

## 2024-04-08 LAB — CBC WITH DIFFERENTIAL (CANCER CENTER ONLY)
Abs Immature Granulocytes: 0.03 K/uL (ref 0.00–0.07)
Basophils Absolute: 0 K/uL (ref 0.0–0.1)
Basophils Relative: 1 %
Eosinophils Absolute: 0.4 K/uL (ref 0.0–0.5)
Eosinophils Relative: 5 %
HCT: 32.1 % — ABNORMAL LOW (ref 36.0–46.0)
Hemoglobin: 10 g/dL — ABNORMAL LOW (ref 12.0–15.0)
Immature Granulocytes: 0 %
Lymphocytes Relative: 25 %
Lymphs Abs: 1.9 K/uL (ref 0.7–4.0)
MCH: 22.4 pg — ABNORMAL LOW (ref 26.0–34.0)
MCHC: 31.2 g/dL (ref 30.0–36.0)
MCV: 71.8 fL — ABNORMAL LOW (ref 80.0–100.0)
Monocytes Absolute: 0.4 K/uL (ref 0.1–1.0)
Monocytes Relative: 5 %
Neutro Abs: 5 K/uL (ref 1.7–7.7)
Neutrophils Relative %: 64 %
Platelet Count: 193 K/uL (ref 150–400)
RBC: 4.47 MIL/uL (ref 3.87–5.11)
RDW: 21.6 % — ABNORMAL HIGH (ref 11.5–15.5)
WBC Count: 7.7 K/uL (ref 4.0–10.5)
nRBC: 0 % (ref 0.0–0.2)

## 2024-04-08 MED ORDER — FAMOTIDINE IN NACL 20-0.9 MG/50ML-% IV SOLN
20.0000 mg | Freq: Once | INTRAVENOUS | Status: AC
  Administered 2024-04-08: 20 mg via INTRAVENOUS
  Filled 2024-04-08: qty 50

## 2024-04-08 MED ORDER — ACETAMINOPHEN 325 MG PO TABS: 650.0000 mg | ORAL_TABLET | Freq: Once | ORAL | Status: DC

## 2024-04-08 MED ORDER — SODIUM CHLORIDE 0.9 % IV SOLN
510.0000 mg | Freq: Once | INTRAVENOUS | Status: AC
  Administered 2024-04-08: 510 mg via INTRAVENOUS
  Filled 2024-04-08: qty 510

## 2024-04-08 MED ORDER — SODIUM CHLORIDE 0.9 % IV SOLN
3.0000 mg/kg | Freq: Once | INTRAVENOUS | Status: AC
  Administered 2024-04-08: 400 mg via INTRAVENOUS
  Filled 2024-04-08: qty 20

## 2024-04-08 MED ORDER — PROCHLORPERAZINE MALEATE 10 MG PO TABS
10.0000 mg | ORAL_TABLET | Freq: Once | ORAL | Status: AC
  Administered 2024-04-08: 10 mg via ORAL
  Filled 2024-04-08: qty 1

## 2024-04-08 MED ORDER — SODIUM CHLORIDE 0.9 % IV SOLN: INTRAVENOUS | Status: DC

## 2024-04-08 MED ORDER — ACETAMINOPHEN 325 MG PO TABS
650.0000 mg | ORAL_TABLET | Freq: Once | ORAL | Status: AC
  Administered 2024-04-08: 650 mg via ORAL
  Filled 2024-04-08: qty 2

## 2024-04-08 MED ORDER — LORATADINE 10 MG PO TABS
10.0000 mg | ORAL_TABLET | Freq: Once | ORAL | Status: AC
  Administered 2024-04-08: 10 mg via ORAL
  Filled 2024-04-08: qty 1

## 2024-04-08 NOTE — Patient Instructions (Signed)
 [facility] - A DEPT OF [facility]  Discharge Instructions: Thank you for choosing [facility] to provide your oncology and hematology care.   If you have a lab appointment with the Cancer Center, please go directly to the Cancer Center and check in at the registration area.   Wear comfortable clothing and clothing appropriate for easy access to any Portacath or PICC line.   We strive to give you quality time with your provider. You may need to reschedule your appointment if you arrive late (15 or more minutes).  Arriving late affects you and other patients whose appointments are after yours.  Also, if you miss three or more appointments without notifying the office, you may be dismissed from the clinic at the provider's discretion.      For prescription refill requests, have your pharmacy contact our office and allow 72 hours for refills to be completed.    Today you received the following chemotherapy and/or immunotherapy agents: Kadcyla .       To help prevent nausea and vomiting after your treatment, we encourage you to take your nausea medication as directed.  BELOW ARE SYMPTOMS THAT SHOULD BE REPORTED IMMEDIATELY: *FEVER GREATER THAN 100.4 F (38 C) OR HIGHER *CHILLS OR SWEATING *NAUSEA AND VOMITING THAT IS NOT CONTROLLED WITH YOUR NAUSEA MEDICATION *UNUSUAL SHORTNESS OF BREATH *UNUSUAL BRUISING OR BLEEDING *URINARY PROBLEMS (pain or burning when urinating, or frequent urination) *BOWEL PROBLEMS (unusual diarrhea, constipation, pain near the anus) TENDERNESS IN MOUTH AND THROAT WITH OR WITHOUT PRESENCE OF ULCERS (sore throat, sores in mouth, or a toothache) UNUSUAL RASH, SWELLING OR PAIN  UNUSUAL VAGINAL DISCHARGE OR ITCHING   Items with * indicate a potential emergency and should be followed up as soon as possible or go to the Emergency Department if any problems should occur.  Please show the CHEMOTHERAPY ALERT CARD or IMMUNOTHERAPY  ALERT CARD at check-in to the Emergency Department and triage nurse.  Should you have questions after your visit or need to cancel or reschedule your appointment, please contact [facility] - A DEPT OF [facility]  Dept: [phone]  and follow the prompts.  Office hours are 8:00 a.m. to 4:30 p.m. Monday - Friday. Please note that voicemails left after 4:00 p.m. may not be returned until the following business day.  We are closed weekends and major holidays. You have access to a nurse at all times for urgent questions. Please call the main number to the clinic Dept: [phone] and follow the prompts.   For any non-urgent questions, you may also contact your provider using MyChart. We now offer e-Visits for anyone 98 and older to request care online for non-urgent symptoms. For details visit [email or web address].   Also download the MyChart app! Go to the app store, search MyChart, open the app, select [facility], and log in with your MyChart username and password.

## 2024-04-08 NOTE — Progress Notes (Signed)
 Pt observed for 30 minutes post first time Feraheme infusion. Pt tolerated Tx well w/out incident. VSS at discharge.  Ambulatory to lobby.

## 2024-04-08 NOTE — Progress Notes (Signed)
 [facility] Cancer Follow up:    Diamond Lowers, MD [address] [place] [address]   DIAGNOSIS:  Cancer Staging  Malignant neoplasm of upper-inner quadrant of left breast in female, estrogen receptor positive (HCC) Staging form: Breast, AJCC 8th Edition - Clinical stage from 06/21/2022: Stage IV (cT3, cN3, cM1, G3, ER+, PR+, HER2+) - Signed by [name], MD on 09/17/2023 Stage prefix: Initial diagnosis Histologic grading system: 3 grade system Stage used in treatment planning: Yes National guidelines used in treatment planning: Yes Type of national guideline used in treatment planning: NCCN    SUMMARY OF ONCOLOGIC HISTORY: Oncology History  Malignant neoplasm of upper-inner quadrant of left breast in female, estrogen receptor positive (HCC)  06/06/2022 Mammogram   Diagnostic mammogram given palpable mass showed suspicious spiculated left breast mass with surrounding nodularity at 9 o'clock position.  This measures up to 3.1 cm sonographically however due to deep location and patient body habitus the mammographic measurement of 5.8 cm is felt to be more accurate.  At least 2 abnormal lymph nodes in the left axilla.  Possible abnormal palpable left supraclavicular lymph node.   06/06/2022 Breast US    Breast ultrasound confirmed these findings.   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  Prognostic showed ER 65% weak to strong staining, PR 15% weak to moderate staining, Ki-67 of 50% and HER2 positive by IHC at 3+    Genetic Testing   [facility] Custom Panel+RNA was Negative. Report date is 06/28/2022.   The Custom Hereditary Cancers Panel offered by [facility] includes sequencing and/or deletion duplication testing of the following 43 genes: APC, ATM, AXIN2, BAP1, BARD1, BMPR1A, BRCA1, BRCA2, BRIP1,  CDH1, CDK4, CDKN2A (p14ARF and p16INK4a only), CHEK2, CTNNA1, EPCAM (Deletion/duplication testing only), FH, GREM1 (promoter region duplication testing only), HOXB13, KIT, MBD4, MEN1, MLH1, MSH2, MSH3, MSH6, MUTYH, NF1, NHTL1, PALB2, PDGFRA, PMS2, POLD1, POLE, PTEN, RAD51C, RAD51D, SMAD4, SMARCA4. STK11, TP53, TSC1, TSC2, and VHL.    06/21/2022 Cancer Staging   Staging form: Breast, AJCC 8th Edition - Clinical stage from 06/21/2022: Stage IV (cT3, cN3, cM1, G3, ER+, PR+, HER2+) - Signed by [name], MD on 09/17/2023 Stage prefix: Initial diagnosis Histologic grading system: 3 grade system Stage used in treatment planning: Yes National guidelines used in treatment planning: Yes Type of national guideline used in treatment planning: NCCN   06/29/2022 PET scan   IMPRESSION: 1. Hypermetabolic left breast mass, compatible with primary breast malignancy. 2. Hypermetabolic left cervical, axillary, subpectoral, supraclavicular and prevascular mediastinal lymph nodes, compatible with nodal metastatic disease. 3. No evidence of metastatic disease in the abdomen or pelvis. 4. Aortic Atherosclerosis (ICD10-I70.0).     Electronically Signed   By: [name] M.D.   On: 06/29/2022 09:32   07/11/2022 - 01/16/2023 Chemotherapy   Patient is on Treatment Plan : BREAST DOCEtaxel  + Trastuzumab  + Pertuzumab  (THP) q21d x 8 cycles / Trastuzumab  + Pertuzumab  q21d x 4 cycles     02/01/2023 - 06/29/2023 Chemotherapy   Patient is on Treatment Plan : BREAST METASTATIC Fam-Trastuzumab Deruxtecan-nxki  (Enhertu ) (5.4) q21d     10/02/2023 -  Chemotherapy   Patient is on Treatment Plan : BREAST ADO-Trastuzumab Emtansine  (Kadcyla ) q21d       CURRENT THERAPY:Kadcyla   INTERVAL HISTORY:  Discussed the use of AI scribe software for clinical note transcription with  the patient, who gave verbal consent to proceed.  History of Present Illness  Diamond Marshall is a 53 year old female who presents for follow-up   while on maintenance kadcyla  and FMLA paperwork.  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 An orthopedic surgeon has suggested surgery. The condition may be related to her sleeping position, She is considering timing the surgery for next year due to work commitments.  A recent scan in November showed no demonstrable met disease and improvement in lung changes consistent with post infectious cause.  Recent blood work indicates iron deficiency anemia. She will be getting an iron infusion today as well.  Her weight is slowly increasing, and she inquires about the possibility of using Mounjaro for weight management. She is interested in the potential benefits of weight loss drugs in reducing cancer risk.  She recently visited [place] from October 30th to November 6th, where she experienced cold weather and rented a breast cancer-themed scooter for mobility.  Rest of the pertinent 10 point ROS reviewed and neg.  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   Breast cancer, stage 4 (HCC) 07/26/2023   Gastroenteritis 09/29/2022   Port-A-Cath in place 07/07/2022   Genetic testing 06/29/2022   Malignant neoplasm of upper-inner quadrant of left breast in female, estrogen receptor positive (HCC) 06/19/2022   Chronic pain of right knee 03/27/2022   Essential hypertension 03/27/2022   Positive self-administered antigen test for COVID-19 03/15/2021   Prediabetes 07/22/2019   Anxiety and depression 03/15/2016   Vitamin D  deficiency 03/15/2016   Metabolic syndrome X 08/07/2013   Elevated random blood glucose level 08/07/2013   Hypertriglyceridemia 08/07/2013   ADD (attention deficit  disorder) 07/24/2013    hyperlipidemia 10/09/2012   Morbid obesity (HCC) 10/09/2012   Hypothyroidism 10/09/2012   B12 deficiency 10/09/2012   Anemia, unspecified 02/12/2011   Dysthymic disorder 02/12/2011   Migraine headache 02/12/2011   Obsessive-compulsive disorder 02/12/2011   Tinnitus 02/12/2011    is allergic to phentermine , bee venom, lactose intolerance (gi), peanut allergen powder-dnfp, topiramate , sulfa antibiotics, tape, and wound dressing adhesive.  MEDICAL HISTORY: Past Medical History:  Diagnosis Date   Abdominal hernia    Anemia    b12 deficiency   Anxiety    Arthritis    B12 deficiency    Back pain    Cancer (HCC)    breast cancer   Depression    Family history of adverse reaction to anesthesia    mother vomits after anesthesia   Gallbladder problem    GERD (gastroesophageal reflux disease)    History of hiatal hernia    small per patient   Hypertension    Hypothyroidism    Joint pain    Knee pain    Palpitation    when iron is low   Pernicious anemia    SOB (shortness of breath)    Thyroid  disease    hypothyroidism   Vitamin D  deficiency     SURGICAL HISTORY: Past Surgical History:  Procedure Laterality Date   BREAST BIOPSY Left 06/09/2022   US  LT BREAST BX W LOC DEV 1ST LESION IMG BX SPEC US  GUIDE 06/09/2022 [facility] MAMMOGRAPHY   CESAREAN SECTION  1999  Dilatation and currettagement     for miscarriage 18 years ago   PORTACATH PLACEMENT Right 07/05/2022   Procedure: INSERTION PORT-A-CATH;  Surgeon: [name], MD;  Location: [facility] OR;  Service: General;  Laterality: Right;   SIMPLE MASTECTOMY WITH AXILLARY SENTINEL NODE BIOPSY Bilateral 07/26/2023   Procedure: LEFT SKIN REDUCING MASTECTOMY, RIGHT SKIN SPARING, RISK REDUCING MASTECTOMY;  Surgeon: [name], MD;  Location: [facility] OR;  Service: General;  Laterality: Bilateral;  PEC BLOCK    SOCIAL HISTORY: Social History   Socioeconomic History   Marital status: Married    Spouse name:  Garrel   Number of children: Not on file   Years of education: Not on file   Highest education level: Not on file  Occupational History   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  Tobacco Use   Smoking status: Never   Smokeless tobacco: Never  Vaping Use   Vaping status: Never Used  Substance and Sexual Activity   Alcohol use: No   Drug use: No   Sexual activity: Yes    Birth control/protection: Pill  Other Topics Concern   Not on file  Social History Narrative   Not on file   Social Drivers of Health   Financial Resource Strain: Not on file  Food Insecurity: No Food Insecurity (10/09/2023)   Hunger Vital Sign    Worried About Running Out of Food in the Last Year: Never true    Ran Out of Food in the Last Year: Never true  Transportation Needs: No Transportation Needs (10/09/2023)   PRAPARE - Administrator, Civil Service (Medical): No    Lack of Transportation (Non-Medical): No  Physical Activity: Not on file  Stress: Not on file  Social Connections: Moderately Integrated (07/26/2023)   Social Connection and Isolation Panel    Frequency of Communication with Friends and Family: Three times a week    Frequency of Social Gatherings with Friends and Family: Three times a week    Attends Religious Services: More than 4 times per year    Active Member of Clubs or Organizations: No    Attends Banker Meetings: Never    Marital Status: Married  Catering Manager Violence: Not At Risk (10/09/2023)   Humiliation, Afraid, Rape, and Kick questionnaire    Fear of Current or Ex-Partner: No    Emotionally Abused: No    Physically Abused: No    Sexually Abused: No    FAMILY HISTORY: Family History  Problem Relation Age of Onset   Hypertension Mother    Diabetes Mother    High Cholesterol Mother    Thyroid  disease Mother    Depression Mother    Anxiety disorder Mother    Obesity Mother    CVA Father        hemorrhagic   Ovarian cancer Maternal Aunt 82    Colon cancer Maternal Aunt    Breast cancer Cousin 73       maternal first cousin, reports negative genetic testing    Review of Systems  Constitutional:  Positive for fatigue. Negative for appetite change, chills, fever and unexpected weight change.  HENT:   Negative for hearing loss, lump/mass and trouble swallowing.   Eyes:  Negative for eye problems and icterus.  Respiratory:  Negative for chest tightness, cough and shortness of breath.   Cardiovascular:  Negative for chest pain, leg swelling and palpitations.  Gastrointestinal:  Negative for abdominal distention, abdominal pain, constipation, diarrhea, nausea and vomiting.  Endocrine: Negative for  hot flashes.  Genitourinary:  Negative for difficulty urinating.   Musculoskeletal:  Negative for arthralgias.  Skin:  Negative for itching and rash.  Neurological:  Negative for dizziness, extremity weakness, headaches and numbness.  Hematological:  Negative for adenopathy. Does not bruise/bleed easily.  Psychiatric/Behavioral:  Negative for depression. The patient is not nervous/anxious.       PHYSICAL EXAMINATION    Vitals:   04/08/24 1040  BP: 137/66  Pulse: 88  Resp: 17  Temp: (!) 97.3 F (36.3 C)  SpO2: 94%    Physical Exam Constitutional:      General: She is not in acute distress.    Appearance: Normal appearance. She is not toxic-appearing.  HENT:     Head: Normocephalic and atraumatic.     Mouth/Throat:     Mouth: Mucous membranes are moist.     Pharynx: Oropharynx is clear. No oropharyngeal exudate or posterior oropharyngeal erythema.  Eyes:     General: No scleral icterus. Cardiovascular:     Rate and Rhythm: Normal rate and regular rhythm.     Pulses: Normal pulses.     Heart sounds: Normal heart sounds.  Pulmonary:     Effort: Pulmonary effort is normal.     Breath sounds: Normal breath sounds.  Abdominal:     General: Abdomen is flat. Bowel sounds are normal. There is no distension.     Palpations:  Abdomen is soft.     Tenderness: There is no abdominal tenderness.  Musculoskeletal:        General: No swelling.     Cervical back: Neck supple.  Lymphadenopathy:     Cervical: No cervical adenopathy.  Skin:    General: Skin is warm and dry.     Findings: No rash.  Neurological:     General: No focal deficit present.     Mental Status: She is alert.  Psychiatric:        Mood and Affect: Mood normal.        Behavior: Behavior normal.     LABORATORY DATA:  CBC    Component Value Date/Time   WBC 7.7 04/08/2024 0955   WBC 6.6 02/29/2024 1922   RBC 4.47 04/08/2024 0955   HGB 10.0 (L) 04/08/2024 0955   HGB 12.0 07/21/2021 1511   HCT 32.1 (L) 04/08/2024 0955   HCT 37.6 07/21/2021 1511   PLT 193 04/08/2024 0955   PLT 359 07/21/2021 1511   MCV 71.8 (L) 04/08/2024 0955   MCV 76 (L) 07/21/2021 1511   MCH 22.4 (L) 04/08/2024 0955   MCHC 31.2 04/08/2024 0955   RDW 21.6 (H) 04/08/2024 0955   RDW 15.7 (H) 07/21/2021 1511   LYMPHSABS 1.9 04/08/2024 0955   LYMPHSABS 1.9 07/21/2021 1511   MONOABS 0.4 04/08/2024 0955   EOSABS 0.4 04/08/2024 0955   EOSABS 0.2 07/21/2021 1511   BASOSABS 0.0 04/08/2024 0955   BASOSABS 0.1 07/21/2021 1511    CMP     Component Value Date/Time   NA 138 04/08/2024 0955   NA 139 03/28/2022 0811   K 3.7 04/08/2024 0955   CL 104 04/08/2024 0955   CO2 28 04/08/2024 0955   GLUCOSE 101 (H) 04/08/2024 0955   BUN 8 04/08/2024 0955   BUN 14 03/28/2022 0811   CREATININE 0.54 04/08/2024 0955   CREATININE 0.74 10/09/2012 1537   CALCIUM 8.9 04/08/2024 0955   PROT 7.5 04/08/2024 0955   PROT 7.1 03/28/2022 0811   ALBUMIN 3.3 (L) 04/08/2024 0955   ALBUMIN 4.2  03/28/2022 0811   AST 34 04/08/2024 0955   ALT 16 04/08/2024 0955   ALKPHOS 152 (H) 04/08/2024 0955   BILITOT 1.0 04/08/2024 0955   GFRNONAA >60 04/08/2024 0955   GFRNONAA >89 10/09/2012 1537   GFRAA 96 05/18/2020 1442   GFRAA >89 10/09/2012 1537     ASSESSMENT and THERAPY PLAN:   No  problem-specific Assessment & Plan notes found for this encounter. Assessment and Plan Assessment & Plan Metastatic breast cancer, left upper-inner quadrant Currently on Kadcyla  with fatigue as the only significant adverse reaction.  Recent CT DNA test showed no evidence of disease. Continue kadcyla  maintenance Consider imaging every 6 months Continue MRD testing every 6 months.  Iron deficiency anemia Likely contributing to fatigue. Scheduled for iron infusion to address deficiency and improve energy levels.  Bilateral ulnar nerve entrapment Orthopedic surgeon recommended surgery. - She prefers to delay surgery until next year due to work commitments.  Improving pulmonary lesion, likely infectious pneumonitis Pulmonary lesion improving, likely infectious pneumonitis.   RTC as noted above.  All questions were answered. The patient knows to call the clinic with any problems, questions or concerns. We can certainly see the patient much sooner if necessary.  Total encounter time:30 minutes*in face-to-face visit time, chart review, lab review, care coordination, order entry, and documentation of the encounter time.   *Total Encounter Time as defined by the Centers for Medicare and Medicaid Services includes, in addition to the face-to-face time of a patient visit (documented in the note above) non-face-to-face time: obtaining and reviewing outside history, ordering and reviewing medications, tests or procedures, care coordination (communications with other health care professionals or caregivers) and documentation in the medical record.

## 2024-04-09 ENCOUNTER — Other Ambulatory Visit: Payer: Self-pay | Admitting: Hematology and Oncology

## 2024-04-09 DIAGNOSIS — E876 Hypokalemia: Secondary | ICD-10-CM

## 2024-04-11 ENCOUNTER — Other Ambulatory Visit: Payer: Self-pay | Admitting: Hematology and Oncology

## 2024-04-15 ENCOUNTER — Encounter: Payer: Self-pay | Admitting: Hematology and Oncology

## 2024-04-15 ENCOUNTER — Other Ambulatory Visit: Payer: Self-pay | Admitting: Hematology and Oncology

## 2024-04-15 ENCOUNTER — Encounter: Payer: Self-pay | Admitting: Family Medicine

## 2024-04-15 DIAGNOSIS — C50212 Malignant neoplasm of upper-inner quadrant of left female breast: Secondary | ICD-10-CM

## 2024-04-16 ENCOUNTER — Other Ambulatory Visit: Payer: Self-pay

## 2024-04-21 ENCOUNTER — Telehealth: Payer: Self-pay

## 2024-04-21 NOTE — Telephone Encounter (Signed)
 Notified the pt regarding her FMLAM form being completed,faxed,and confirmation received. Pt copy was emailed upon request.

## 2024-04-22 ENCOUNTER — Other Ambulatory Visit: Payer: Self-pay

## 2024-04-22 ENCOUNTER — Telehealth: Payer: Self-pay

## 2024-04-22 NOTE — Telephone Encounter (Signed)
 Called pt to discuss Iron infusion. She had one 11/18 and needs one additional. We are able to accommodate her for infusion when she comes for her chemo tx 12/9. LVM for pt to let her know.

## 2024-04-24 ENCOUNTER — Other Ambulatory Visit: Payer: Self-pay | Admitting: *Deleted

## 2024-04-24 ENCOUNTER — Encounter: Payer: Self-pay | Admitting: Hematology and Oncology

## 2024-04-24 DIAGNOSIS — Z170 Estrogen receptor positive status [ER+]: Secondary | ICD-10-CM

## 2024-04-24 DIAGNOSIS — G5623 Lesion of ulnar nerve, bilateral upper limbs: Secondary | ICD-10-CM

## 2024-04-24 NOTE — Telephone Encounter (Signed)
 Forwarding to Dr. Joane to review and advise regarding Lyrica .

## 2024-04-29 ENCOUNTER — Inpatient Hospital Stay

## 2024-04-29 ENCOUNTER — Inpatient Hospital Stay: Attending: Hematology and Oncology | Admitting: Hematology and Oncology

## 2024-04-29 ENCOUNTER — Encounter: Payer: Self-pay | Admitting: Hematology and Oncology

## 2024-04-29 ENCOUNTER — Inpatient Hospital Stay: Attending: Hematology and Oncology

## 2024-04-29 VITALS — BP 128/62 | HR 94 | Temp 98.4°F | Resp 16 | Ht 61.0 in | Wt 274.0 lb

## 2024-04-29 DIAGNOSIS — D509 Iron deficiency anemia, unspecified: Secondary | ICD-10-CM | POA: Insufficient documentation

## 2024-04-29 DIAGNOSIS — Z5112 Encounter for antineoplastic immunotherapy: Secondary | ICD-10-CM | POA: Insufficient documentation

## 2024-04-29 DIAGNOSIS — Z1721 Progesterone receptor positive status: Secondary | ICD-10-CM | POA: Insufficient documentation

## 2024-04-29 DIAGNOSIS — Z170 Estrogen receptor positive status [ER+]: Secondary | ICD-10-CM | POA: Diagnosis present

## 2024-04-29 DIAGNOSIS — Z1731 Human epidermal growth factor receptor 2 positive status: Secondary | ICD-10-CM | POA: Insufficient documentation

## 2024-04-29 DIAGNOSIS — G5623 Lesion of ulnar nerve, bilateral upper limbs: Secondary | ICD-10-CM | POA: Diagnosis not present

## 2024-04-29 DIAGNOSIS — C50212 Malignant neoplasm of upper-inner quadrant of left female breast: Secondary | ICD-10-CM

## 2024-04-29 DIAGNOSIS — Z803 Family history of malignant neoplasm of breast: Secondary | ICD-10-CM | POA: Diagnosis not present

## 2024-04-29 DIAGNOSIS — Z95828 Presence of other vascular implants and grafts: Secondary | ICD-10-CM

## 2024-04-29 LAB — CBC WITH DIFFERENTIAL (CANCER CENTER ONLY)
Abs Immature Granulocytes: 0.02 K/uL (ref 0.00–0.07)
Basophils Absolute: 0.1 K/uL (ref 0.0–0.1)
Basophils Relative: 1 %
Eosinophils Absolute: 0.5 K/uL (ref 0.0–0.5)
Eosinophils Relative: 6 %
HCT: 36 % (ref 36.0–46.0)
Hemoglobin: 11 g/dL — ABNORMAL LOW (ref 12.0–15.0)
Immature Granulocytes: 0 %
Lymphocytes Relative: 26 %
Lymphs Abs: 2 K/uL (ref 0.7–4.0)
MCH: 23.5 pg — ABNORMAL LOW (ref 26.0–34.0)
MCHC: 30.6 g/dL (ref 30.0–36.0)
MCV: 76.8 fL — ABNORMAL LOW (ref 80.0–100.0)
Monocytes Absolute: 0.4 K/uL (ref 0.1–1.0)
Monocytes Relative: 5 %
Neutro Abs: 4.7 K/uL (ref 1.7–7.7)
Neutrophils Relative %: 62 %
Platelet Count: 157 K/uL (ref 150–400)
RBC: 4.69 MIL/uL (ref 3.87–5.11)
RDW: 25.6 % — ABNORMAL HIGH (ref 11.5–15.5)
WBC Count: 7.7 K/uL (ref 4.0–10.5)
nRBC: 0 % (ref 0.0–0.2)

## 2024-04-29 LAB — CMP (CANCER CENTER ONLY)
ALT: 24 U/L (ref 0–44)
AST: 50 U/L — ABNORMAL HIGH (ref 15–41)
Albumin: 3.2 g/dL — ABNORMAL LOW (ref 3.5–5.0)
Alkaline Phosphatase: 167 U/L — ABNORMAL HIGH (ref 38–126)
Anion gap: 10 (ref 5–15)
BUN: 8 mg/dL (ref 6–20)
CO2: 25 mmol/L (ref 22–32)
Calcium: 8.9 mg/dL (ref 8.9–10.3)
Chloride: 104 mmol/L (ref 98–111)
Creatinine: 0.6 mg/dL (ref 0.44–1.00)
GFR, Estimated: >60 mL/min (ref >60)
Glucose, Bld: 149 mg/dL — ABNORMAL HIGH (ref 70–99)
Potassium: 3.8 mmol/L (ref 3.5–5.1)
Sodium: 138 mmol/L (ref 135–145)
Total Bilirubin: 1.1 mg/dL (ref 0.0–1.2)
Total Protein: 8.1 g/dL (ref 6.5–8.1)

## 2024-04-29 MED ORDER — FAMOTIDINE IN NACL 20-0.9 MG/50ML-% IV SOLN
20.0000 mg | Freq: Once | INTRAVENOUS | Status: AC
  Administered 2024-04-29: 20 mg via INTRAVENOUS
  Filled 2024-04-29: qty 50

## 2024-04-29 MED ORDER — LORATADINE 10 MG PO TABS
10.0000 mg | ORAL_TABLET | Freq: Once | ORAL | Status: AC
  Administered 2024-04-29: 10 mg via ORAL
  Filled 2024-04-29: qty 1

## 2024-04-29 MED ORDER — ACETAMINOPHEN 325 MG PO TABS
650.0000 mg | ORAL_TABLET | Freq: Once | ORAL | Status: AC
  Administered 2024-04-29: 650 mg via ORAL
  Filled 2024-04-29: qty 2

## 2024-04-29 MED ORDER — SODIUM CHLORIDE 0.9 % IV SOLN
510.0000 mg | Freq: Once | INTRAVENOUS | Status: AC
  Administered 2024-04-29: 510 mg via INTRAVENOUS
  Filled 2024-04-29: qty 510

## 2024-04-29 MED ORDER — SODIUM CHLORIDE 0.9 % IV SOLN
3.0000 mg/kg | Freq: Once | INTRAVENOUS | Status: AC
  Administered 2024-04-29: 400 mg via INTRAVENOUS
  Filled 2024-04-29: qty 20

## 2024-04-29 MED ORDER — SODIUM CHLORIDE 0.9 % IV SOLN: INTRAVENOUS | Status: DC

## 2024-04-29 MED ORDER — PROCHLORPERAZINE MALEATE 10 MG PO TABS: 10.0000 mg | ORAL_TABLET | Freq: Once | ORAL | Status: DC

## 2024-04-29 NOTE — Progress Notes (Signed)
 [facility] Cancer Follow up:    Zollie Lowers, MD [address] [place] [address]   DIAGNOSIS:  Cancer Staging  Malignant neoplasm of upper-inner quadrant of left breast in female, estrogen receptor positive (HCC) Staging form: Breast, AJCC 8th Edition - Clinical stage from 06/21/2022: Stage IV (cT3, cN3, cM1, G3, ER+, PR+, HER2+) - Signed by [name], MD on 09/17/2023 Stage prefix: Initial diagnosis Histologic grading system: 3 grade system Stage used in treatment planning: Yes National guidelines used in treatment planning: Yes Type of national guideline used in treatment planning: NCCN    SUMMARY OF ONCOLOGIC HISTORY: Oncology History  Malignant neoplasm of upper-inner quadrant of left breast in female, estrogen receptor positive (HCC)  06/06/2022 Mammogram   Diagnostic mammogram given palpable mass showed suspicious spiculated left breast mass with surrounding nodularity at 9 o'clock position.  This measures up to 3.1 cm sonographically however due to deep location and patient body habitus the mammographic measurement of 5.8 cm is felt to be more accurate.  At least 2 abnormal lymph nodes in the left axilla.  Possible abnormal palpable left supraclavicular lymph node.   06/06/2022 Breast US    Breast ultrasound confirmed these findings.   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  Prognostic showed ER 65% weak to strong staining, PR 15% weak to moderate staining, Ki-67 of 50% and HER2 positive by IHC at 3+    Genetic Testing   [facility] Custom Panel+RNA was Negative. Report date is 06/28/2022.   The Custom Hereditary Cancers Panel offered by [facility] includes sequencing and/or deletion duplication testing of the following 43 genes: APC, ATM, AXIN2, BAP1, BARD1, BMPR1A, BRCA1, BRCA2, BRIP1,  CDH1, CDK4, CDKN2A (p14ARF and p16INK4a only), CHEK2, CTNNA1, EPCAM (Deletion/duplication testing only), FH, GREM1 (promoter region duplication testing only), HOXB13, KIT, MBD4, MEN1, MLH1, MSH2, MSH3, MSH6, MUTYH, NF1, NHTL1, PALB2, PDGFRA, PMS2, POLD1, POLE, PTEN, RAD51C, RAD51D, SMAD4, SMARCA4. STK11, TP53, TSC1, TSC2, and VHL.    06/21/2022 Cancer Staging   Staging form: Breast, AJCC 8th Edition - Clinical stage from 06/21/2022: Stage IV (cT3, cN3, cM1, G3, ER+, PR+, HER2+) - Signed by [name], MD on 09/17/2023 Stage prefix: Initial diagnosis Histologic grading system: 3 grade system Stage used in treatment planning: Yes National guidelines used in treatment planning: Yes Type of national guideline used in treatment planning: NCCN   06/29/2022 PET scan   IMPRESSION: 1. Hypermetabolic left breast mass, compatible with primary breast malignancy. 2. Hypermetabolic left cervical, axillary, subpectoral, supraclavicular and prevascular mediastinal lymph nodes, compatible with nodal metastatic disease. 3. No evidence of metastatic disease in the abdomen or pelvis. 4. Aortic Atherosclerosis (ICD10-I70.0).     Electronically Signed   By: [name] M.D.   On: 06/29/2022 09:32   07/11/2022 - 01/16/2023 Chemotherapy   Patient is on Treatment Plan : BREAST DOCEtaxel  + Trastuzumab  + Pertuzumab  (THP) q21d x 8 cycles / Trastuzumab  + Pertuzumab  q21d x 4 cycles     02/01/2023 - 06/29/2023 Chemotherapy   Patient is on Treatment Plan : BREAST METASTATIC Fam-Trastuzumab Deruxtecan-nxki  (Enhertu ) (5.4) q21d     10/02/2023 -  Chemotherapy   Patient is on Treatment Plan : BREAST ADO-Trastuzumab Emtansine  (Kadcyla ) q21d       CURRENT THERAPY:Kadcyla   INTERVAL HISTORY:  History of Present Illness  GERAL TUCH is a 53 year  old female who presents for follow-up  while on maintenance kadcyla .  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 She  is hoping to have this surgery early January. She also had an episode of sinusitis and recently completed a course of amoxicillin . No cough, chest pain or SOB. No change in bowel habits or urinary habits. She wonders how long she needs to stay on Kadcyla .  Rest of the pertinent 10 point ROS reviewed and neg.  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   Breast cancer, stage 4 (HCC) 07/26/2023   Gastroenteritis 09/29/2022   Port-A-Cath in place 07/07/2022   Genetic testing 06/29/2022   Malignant neoplasm of upper-inner quadrant of left breast in female, estrogen receptor positive (HCC) 06/19/2022   Chronic pain of right knee 03/27/2022   Essential hypertension 03/27/2022   Positive self-administered antigen test for COVID-19 03/15/2021   Prediabetes 07/22/2019   Anxiety and depression 03/15/2016   Vitamin D  deficiency 03/15/2016   Metabolic syndrome X 08/07/2013   Elevated random blood glucose level 08/07/2013   Hypertriglyceridemia 08/07/2013   ADD (attention deficit disorder) 07/24/2013    hyperlipidemia 10/09/2012   Morbid obesity (HCC) 10/09/2012   Hypothyroidism 10/09/2012   B12 deficiency 10/09/2012   Anemia, unspecified 02/12/2011   Dysthymic disorder 02/12/2011   Migraine headache 02/12/2011   Obsessive-compulsive disorder 02/12/2011   Tinnitus 02/12/2011    is allergic to phentermine , bee venom, lactose intolerance (gi), peanut allergen powder-dnfp, topiramate , sulfa antibiotics, tape, and wound dressing adhesive.  MEDICAL HISTORY: Past Medical History:  Diagnosis Date   Abdominal hernia    Anemia    b12 deficiency   Anxiety    Arthritis    B12 deficiency    Back pain    Cancer (HCC)    breast cancer    Depression    Family history of adverse reaction to anesthesia    mother vomits after anesthesia   Gallbladder problem    GERD (gastroesophageal reflux disease)    History of hiatal hernia    small per patient   Hypertension    Hypothyroidism    Joint pain    Knee pain    Palpitation    when iron is low   Pernicious anemia    SOB (shortness of breath)    Thyroid  disease    hypothyroidism   Vitamin D  deficiency     SURGICAL HISTORY: Past Surgical History:  Procedure Laterality Date   BREAST BIOPSY Left 06/09/2022   US  LT BREAST BX W LOC DEV 1ST LESION IMG BX SPEC US  GUIDE 06/09/2022 [facility] MAMMOGRAPHY   CESAREAN SECTION  1999   Dilatation and currettagement     for miscarriage 18 years ago   PORTACATH PLACEMENT Right 07/05/2022   Procedure: INSERTION PORT-A-CATH;  Surgeon: [name], MD;  Location: [facility] OR;  Service: General;  Laterality: Right;   SIMPLE MASTECTOMY WITH AXILLARY SENTINEL NODE BIOPSY Bilateral 07/26/2023   Procedure: LEFT SKIN REDUCING MASTECTOMY, RIGHT SKIN SPARING, RISK REDUCING MASTECTOMY;  Surgeon: [name], MD;  Location: [facility] OR;  Service: General;  Laterality: Bilateral;  PEC BLOCK    SOCIAL HISTORY: Social History   Socioeconomic History   Marital status: Married    Spouse name:  Garrel   Number of children: Not on file   Years of education: Not on file   Highest education level: Not on file  Occupational History   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  Tobacco Use   Smoking status: Never   Smokeless tobacco: Never  Vaping Use   Vaping status: Never Used  Substance and Sexual Activity   Alcohol use: No   Drug use: No   Sexual activity: Yes    Birth control/protection: Pill  Other Topics Concern   Not on file  Social History Narrative   Not on file   Social Drivers of Health   Financial Resource Strain: Not on file  Food Insecurity: No Food Insecurity (10/09/2023)   Hunger Vital Sign    Worried About Running Out of Food in  the Last Year: Never true    Ran Out of Food in the Last Year: Never true  Transportation Needs: No Transportation Needs (10/09/2023)   PRAPARE - Administrator, Civil Service (Medical): No    Lack of Transportation (Non-Medical): No  Physical Activity: Not on file  Stress: Not on file  Social Connections: Moderately Integrated (07/26/2023)   Social Connection and Isolation Panel    Frequency of Communication with Friends and Family: Three times a week    Frequency of Social Gatherings with Friends and Family: Three times a week    Attends Religious Services: More than 4 times per year    Active Member of Clubs or Organizations: No    Attends Banker Meetings: Never    Marital Status: Married  Catering Manager Violence: Not At Risk (10/09/2023)   Humiliation, Afraid, Rape, and Kick questionnaire    Fear of Current or Ex-Partner: No    Emotionally Abused: No    Physically Abused: No    Sexually Abused: No    FAMILY HISTORY: Family History  Problem Relation Age of Onset   Hypertension Mother    Diabetes Mother    High Cholesterol Mother    Thyroid  disease Mother    Depression Mother    Anxiety disorder Mother    Obesity Mother    CVA Father        hemorrhagic   Ovarian cancer Maternal Aunt 57   Colon cancer Maternal Aunt    Breast cancer Cousin 21       maternal first cousin, reports negative genetic testing      PHYSICAL EXAMINATION    Physical Exam Constitutional:      General: She is not in acute distress.    Appearance: Normal appearance. She is not toxic-appearing.  HENT:     Head: Normocephalic and atraumatic.     Mouth/Throat:     Mouth: Mucous membranes are moist.     Pharynx: Oropharynx is clear. No oropharyngeal exudate or posterior oropharyngeal erythema.  Eyes:     General: No scleral icterus. Cardiovascular:     Rate and Rhythm: Normal rate and regular rhythm.     Pulses: Normal pulses.     Heart sounds: Normal heart sounds.   Pulmonary:     Effort: Pulmonary effort is normal.     Breath sounds: Normal breath sounds.  Abdominal:     General: Abdomen is flat. Bowel sounds are normal. There is no distension.     Palpations: Abdomen is soft.     Tenderness: There is no abdominal tenderness.  Musculoskeletal:        General: No swelling.     Cervical back:  Neck supple.  Lymphadenopathy:     Cervical: No cervical adenopathy.  Skin:    General: Skin is warm and dry.     Findings: No rash.  Neurological:     General: No focal deficit present.     Mental Status: She is alert.  Psychiatric:        Mood and Affect: Mood normal.        Behavior: Behavior normal.     LABORATORY DATA:  CBC    Component Value Date/Time   WBC 7.7 04/29/2024 1220   WBC 6.6 02/29/2024 1922   RBC 4.69 04/29/2024 1220   HGB 11.0 (L) 04/29/2024 1220   HGB 12.0 07/21/2021 1511   HCT 36.0 04/29/2024 1220   HCT 37.6 07/21/2021 1511   PLT 157 04/29/2024 1220   PLT 359 07/21/2021 1511   MCV 76.8 (L) 04/29/2024 1220   MCV 76 (L) 07/21/2021 1511   MCH 23.5 (L) 04/29/2024 1220   MCHC 30.6 04/29/2024 1220   RDW 25.6 (H) 04/29/2024 1220   RDW 15.7 (H) 07/21/2021 1511   LYMPHSABS 2.0 04/29/2024 1220   LYMPHSABS 1.9 07/21/2021 1511   MONOABS 0.4 04/29/2024 1220   EOSABS 0.5 04/29/2024 1220   EOSABS 0.2 07/21/2021 1511   BASOSABS 0.1 04/29/2024 1220   BASOSABS 0.1 07/21/2021 1511    CMP     Component Value Date/Time   NA 138 04/29/2024 1220   NA 139 03/28/2022 0811   K 3.8 04/29/2024 1220   CL 104 04/29/2024 1220   CO2 25 04/29/2024 1220   GLUCOSE 149 (H) 04/29/2024 1220   BUN 8 04/29/2024 1220   BUN 14 03/28/2022 0811   CREATININE 0.60 04/29/2024 1220   CREATININE 0.74 10/09/2012 1537   CALCIUM 8.9 04/29/2024 1220   PROT 8.1 04/29/2024 1220   PROT 7.1 03/28/2022 0811   ALBUMIN 3.2 (L) 04/29/2024 1220   ALBUMIN 4.2 03/28/2022 0811   AST 50 (H) 04/29/2024 1220   ALT 24 04/29/2024 1220   ALKPHOS 167 (H) 04/29/2024  1220   BILITOT 1.1 04/29/2024 1220   GFRNONAA >60 04/29/2024 1220   GFRNONAA >89 10/09/2012 1537   GFRAA 96 05/18/2020 1442   GFRAA >89 10/09/2012 1537     ASSESSMENT and THERAPY PLAN:   Assessment & Plan Metastatic breast cancer, left upper-inner quadrant Currently on Kadcyla  with fatigue as the only significant adverse reaction.  Recent CT DNA test showed no evidence of disease. Continue kadcyla  maintenance Consider imaging every 6 months Continue MRD testing every 6 months.  Iron deficiency anemia S/p 2 infusions of ferraheme. CBC today with improving Hb Will continue to monitor.  Bilateral ulnar nerve entrapment Orthopedic surgeon recommended surgery. Ok to proceed with surgery when deemed appropriate.  RTC as noted above.  All questions were answered. The patient knows to call the clinic with any problems, questions or concerns. We can certainly see the patient much sooner if necessary.  Total encounter time:30 minutes*in face-to-face visit time, chart review, lab review, care coordination, order entry, and documentation of the encounter time.   *Total Encounter Time as defined by the Centers for Medicare and Medicaid Services includes, in addition to the face-to-face time of a patient visit (documented in the note above) non-face-to-face time: obtaining and reviewing outside history, ordering and reviewing medications, tests or procedures, care coordination (communications with other health care professionals or caregivers) and documentation in the medical record.

## 2024-04-29 NOTE — Patient Instructions (Signed)
 [facility] - A DEPT OF [facility]  Discharge Instructions: Thank you for choosing [facility] to provide your oncology and hematology care.   If you have a lab appointment with the Cancer Center, please go directly to the Cancer Center and check in at the registration area.   Wear comfortable clothing and clothing appropriate for easy access to any Portacath or PICC line.   We strive to give you quality time with your provider. You may need to reschedule your appointment if you arrive late (15 or more minutes).  Arriving late affects you and other patients whose appointments are after yours.  Also, if you miss three or more appointments without notifying the office, you may be dismissed from the clinic at the provider's discretion.      For prescription refill requests, have your pharmacy contact our office and allow 72 hours for refills to be completed.    Today you received the following chemotherapy and/or immunotherapy agents: ado-trastuzumab emtansine  (Kadcyla )      To help prevent nausea and vomiting after your treatment, we encourage you to take your nausea medication as directed.  BELOW ARE SYMPTOMS THAT SHOULD BE REPORTED IMMEDIATELY: *FEVER GREATER THAN 100.4 F (38 C) OR HIGHER *CHILLS OR SWEATING *NAUSEA AND VOMITING THAT IS NOT CONTROLLED WITH YOUR NAUSEA MEDICATION *UNUSUAL SHORTNESS OF BREATH *UNUSUAL BRUISING OR BLEEDING *URINARY PROBLEMS (pain or burning when urinating, or frequent urination) *BOWEL PROBLEMS (unusual diarrhea, constipation, pain near the anus) TENDERNESS IN MOUTH AND THROAT WITH OR WITHOUT PRESENCE OF ULCERS (sore throat, sores in mouth, or a toothache) UNUSUAL RASH, SWELLING OR PAIN  UNUSUAL VAGINAL DISCHARGE OR ITCHING   Items with * indicate a potential emergency and should be followed up as soon as possible or go to the Emergency Department if any problems should occur.  Please show the CHEMOTHERAPY  ALERT CARD or IMMUNOTHERAPY ALERT CARD at check-in to the Emergency Department and triage nurse.  Should you have questions after your visit or need to cancel or reschedule your appointment, please contact [facility] - A DEPT OF [facility]  Dept: [phone]  and follow the prompts.  Office hours are 8:00 a.m. to 4:30 p.m. Monday - Friday. Please note that voicemails left after 4:00 p.m. may not be returned until the following business day.  We are closed weekends and major holidays. You have access to a nurse at all times for urgent questions. Please call the main number to the clinic Dept: [phone] and follow the prompts.   For any non-urgent questions, you may also contact your provider using MyChart. We now offer e-Visits for anyone 38 and older to request care online for non-urgent symptoms. For details visit [email or web address].   Also download the MyChart app! Go to the app store, search MyChart, open the app, select [place], and log in with your MyChart username and password.  Ferumoxytol  Injection What is this medication? FERUMOXYTOL  (FER ue MOX i tol) treats low levels of iron in your body (iron deficiency anemia). Iron is a mineral that plays an important role in making red blood cells, which carry oxygen from your lungs to the rest of your body. This medicine may be used for other purposes; ask your health care provider or pharmacist if you have questions. COMMON BRAND NAME(S): Feraheme What should I tell my care team before I take this medication? They need to know if you  have any of these conditions: Anemia not caused by low iron levels High levels of iron in the blood Magnetic resonance imaging (MRI) test scheduled An unusual or allergic reaction to iron, other medications, foods, dyes, or preservatives Pregnant or trying to get pregnant Breastfeeding How should I use this medication? This medication is injected into a vein. It is  given by your care team in a hospital or clinic setting. Talk to your care team the use of this medication in children. Special care may be needed. Overdosage: If you think you have taken too much of this medicine contact a poison control center or emergency room at once. NOTE: This medicine is only for you. Do not share this medicine with others. What if I miss a dose? It is important not to miss your dose. Call your care team if you are unable to keep an appointment. What may interact with this medication? Other iron products This list may not describe all possible interactions. Give your health care provider a list of all the medicines, herbs, non-prescription drugs, or dietary supplements you use. Also tell them if you smoke, drink alcohol, or use illegal drugs. Some items may interact with your medicine. What should I watch for while using this medication? Visit your care team for regular checks on your progress. Tell your care team if your symptoms do not start to get better or if they get worse. You may need blood work done while you are taking this medication. You may need to eat more foods that contain iron. Talk to your care team. Foods that contain iron include whole grains or cereals, dried fruits, beans, peas, leafy green vegetables, and organ meats (liver, kidney). What side effects may I notice from receiving this medication? Side effects that you should report to your care team as soon as possible: Allergic reactions--skin rash, itching, hives, swelling of the face, lips, tongue, or throat Low blood pressure--dizziness, feeling faint or lightheaded, blurry vision Shortness of breath Side effects that usually do not require medical attention (report to your care team if they continue or are bothersome): Flushing Headache Joint pain Muscle pain Nausea Pain, redness, or irritation at injection site This list may not describe all possible side effects. Call your doctor for  medical advice about side effects. You may report side effects to FDA at 1-800-FDA-1088. Where should I keep my medication? This medication is given in a hospital or clinic. It will not be stored at home. NOTE: This sheet is a summary. It may not cover all possible information. If you have questions about this medicine, talk to your doctor, pharmacist, or health care provider.  2024 Elsevier/Gold Standard (2022-12-27 00:00:00)

## 2024-04-30 NOTE — Telephone Encounter (Signed)
 I have messaged with the office that sent the Referral as well and that communication is within the referral.

## 2024-04-30 NOTE — Telephone Encounter (Signed)
 Called patient as a referral was sent to our office and it was sent for Physical Therapy. I called patient to clarify as the notes in the referral stated Diamond Marshall. She states she does not need PT but that surgery was discussed last time she saw Dr. Lloyd and she is not sure what to do. She is not wanting to schedule an appt with Dr. Lloyd but she would like to know the next steps she needs to take. Please advise.

## 2024-04-30 NOTE — Telephone Encounter (Signed)
 Hi Dr. Joane and Dr. Loretha,  It seems that there may be some confusion. We advised pt to have Dr. Loretha set up referral for Ortho Surg for surgical consult so that there could be better coordination or care between Heme Onc and Ortho. Pt has reached out to us  about the referral again. We also received a referral to Dr. Joane for PT, then informed that it should be for PT here with [facility]. We reached out to pt about PT and pt started that she was not supposed to be referred to PT but instead, referred for surgical consult with Ortho Surg d/t lab levels being within acceptable limits. I'm reaching out to get clarification on this matter.   Thank you for your time.   Sabre Leonetti, CMA  Per visit note with Heme Onc 04/29/24:  Assessment & Plan Metastatic breast cancer, left upper-inner quadrant Currently on Kadcyla  with fatigue as the only significant adverse reaction.  Recent CT DNA test showed no evidence of disease. Continue kadcyla  maintenance Consider imaging every 6 months Continue MRD testing every 6 months.   Iron deficiency anemia S/p 2 infusions of ferraheme. CBC today with improving Hb Will continue to monitor.   Bilateral ulnar nerve entrapment Orthopedic surgeon recommended surgery. Ok to proceed with surgery when deemed appropriate.

## 2024-05-01 NOTE — Addendum Note (Signed)
 Addended by: MARDY LEOTIS RAMAN on: 05/01/2024 03:29 PM   Modules accepted: Orders

## 2024-05-01 NOTE — Telephone Encounter (Signed)
 Per Dr. Joane, OK to refer to Dr. Erwin at [facility] for cubital tunnel syndrome.   Referral placed.

## 2024-05-02 ENCOUNTER — Telehealth: Payer: Self-pay | Admitting: Family Medicine

## 2024-05-02 MED ORDER — PREGABALIN 100 MG PO CAPS: 100.0000 mg | ORAL_CAPSULE | Freq: Every day | ORAL | 6 refills | Status: DC

## 2024-05-02 MED ORDER — PREGABALIN 50 MG PO CAPS: 50.0000 mg | ORAL_CAPSULE | Freq: Every day | ORAL | 0 refills | Status: DC

## 2024-05-02 NOTE — Telephone Encounter (Signed)
 Referral was sent here for Physical Therapy. Please confirm if this is correct or should be sent back to referring office.

## 2024-05-02 NOTE — Addendum Note (Signed)
 Addended by: MARDY LEOTIS RAMAN on: 05/02/2024 07:31 AM   Modules accepted: Orders

## 2024-05-02 NOTE — Addendum Note (Signed)
 Addended by: JOANE ARTIST RAMAN on: 05/02/2024 01:57 PM   Modules accepted: Orders

## 2024-05-02 NOTE — Telephone Encounter (Signed)
 Per previous MyChart msgs, pt needs refill of Lyrica  WITH an increase of dosage to 100 mg. This has been helpful as pt has been taking two 50mg  as per Dr. Virgilio instructions.  Please fill today, per pt she does not have enough of the 50's to make it through the weekend.

## 2024-05-02 NOTE — Telephone Encounter (Signed)
 Pt referred to Ortho Surg.

## 2024-05-02 NOTE — Telephone Encounter (Signed)
 Discussed with their office in the communications of her referral and not sure what to do with the referral based on that communication.

## 2024-05-02 NOTE — Telephone Encounter (Signed)
 Forwarding to Dr. Denyse Amass.

## 2024-05-04 ENCOUNTER — Other Ambulatory Visit: Payer: Self-pay

## 2024-05-05 NOTE — Addendum Note (Signed)
 Addended by: MARDY LEOTIS RAMAN on: 05/05/2024 12:31 PM   Modules accepted: Orders

## 2024-05-06 MED ORDER — PREGABALIN 100 MG PO CAPS: 100.0000 mg | ORAL_CAPSULE | Freq: Two times a day (BID) | ORAL | 6 refills | Status: AC | PRN

## 2024-05-07 ENCOUNTER — Other Ambulatory Visit: Payer: Self-pay

## 2024-05-07 ENCOUNTER — Encounter: Payer: Self-pay | Admitting: Hematology and Oncology

## 2024-05-12 ENCOUNTER — Other Ambulatory Visit: Payer: Self-pay | Admitting: Hematology and Oncology

## 2024-05-12 ENCOUNTER — Telehealth: Payer: Self-pay

## 2024-05-12 DIAGNOSIS — E876 Hypokalemia: Secondary | ICD-10-CM

## 2024-05-12 NOTE — Telephone Encounter (Signed)
 Pt called and reports she burned her finger on the broiler of her oven this weekend, She called the access nurse line and was advised to go to ED but declined because she is immunosuppressed and did not want to take the chance of getting FLU/RSV/COVID.   She reports her thumb had a blister but the top layer of her skin came off after a shower. She is keeping it clean, neosporin and dressed to prevent infection. Pt was educated on s/sx of infection to look for and she verbalized thanks and understanding.

## 2024-05-20 ENCOUNTER — Inpatient Hospital Stay

## 2024-05-20 ENCOUNTER — Inpatient Hospital Stay (HOSPITAL_BASED_OUTPATIENT_CLINIC_OR_DEPARTMENT_OTHER): Admitting: Hematology and Oncology

## 2024-05-20 VITALS — BP 144/90 | HR 82 | Temp 97.7°F | Resp 17 | Wt 274.9 lb

## 2024-05-20 DIAGNOSIS — Z170 Estrogen receptor positive status [ER+]: Secondary | ICD-10-CM

## 2024-05-20 DIAGNOSIS — C50212 Malignant neoplasm of upper-inner quadrant of left female breast: Secondary | ICD-10-CM | POA: Diagnosis not present

## 2024-05-20 DIAGNOSIS — Z5112 Encounter for antineoplastic immunotherapy: Secondary | ICD-10-CM | POA: Diagnosis not present

## 2024-05-20 LAB — CMP (CANCER CENTER ONLY)
ALT: 24 U/L (ref 0–44)
AST: 50 U/L — ABNORMAL HIGH (ref 15–41)
Albumin: 3.5 g/dL (ref 3.5–5.0)
Alkaline Phosphatase: 176 U/L — ABNORMAL HIGH (ref 38–126)
Anion gap: 8 (ref 5–15)
BUN: 8 mg/dL (ref 6–20)
CO2: 27 mmol/L (ref 22–32)
Calcium: 9.1 mg/dL (ref 8.9–10.3)
Chloride: 105 mmol/L (ref 98–111)
Creatinine: 0.64 mg/dL (ref 0.44–1.00)
GFR, Estimated: >60 mL/min
Glucose, Bld: 92 mg/dL (ref 70–99)
Potassium: 3.6 mmol/L (ref 3.5–5.1)
Sodium: 140 mmol/L (ref 135–145)
Total Bilirubin: 1 mg/dL (ref 0.0–1.2)
Total Protein: 7.9 g/dL (ref 6.5–8.1)

## 2024-05-20 LAB — CBC WITH DIFFERENTIAL (CANCER CENTER ONLY)
Abs Immature Granulocytes: 0.02 K/uL (ref 0.00–0.07)
Basophils Absolute: 0.1 K/uL (ref 0.0–0.1)
Basophils Relative: 1 %
Eosinophils Absolute: 0.4 K/uL (ref 0.0–0.5)
Eosinophils Relative: 5 %
HCT: 37.7 % (ref 36.0–46.0)
Hemoglobin: 11.9 g/dL — ABNORMAL LOW (ref 12.0–15.0)
Immature Granulocytes: 0 %
Lymphocytes Relative: 30 %
Lymphs Abs: 2.2 K/uL (ref 0.7–4.0)
MCH: 25.1 pg — ABNORMAL LOW (ref 26.0–34.0)
MCHC: 31.6 g/dL (ref 30.0–36.0)
MCV: 79.4 fL — ABNORMAL LOW (ref 80.0–100.0)
Monocytes Absolute: 0.5 K/uL (ref 0.1–1.0)
Monocytes Relative: 6 %
Neutro Abs: 4.3 K/uL (ref 1.7–7.7)
Neutrophils Relative %: 58 %
Platelet Count: 126 K/uL — ABNORMAL LOW (ref 150–400)
RBC: 4.75 MIL/uL (ref 3.87–5.11)
RDW: 25.3 % — ABNORMAL HIGH (ref 11.5–15.5)
WBC Count: 7.5 K/uL (ref 4.0–10.5)
nRBC: 0 % (ref 0.0–0.2)

## 2024-05-20 MED ORDER — ACETAMINOPHEN 325 MG PO TABS
650.0000 mg | ORAL_TABLET | Freq: Once | ORAL | Status: AC
  Administered 2024-05-20: 650 mg via ORAL
  Filled 2024-05-20: qty 2

## 2024-05-20 MED ORDER — LORATADINE 10 MG PO TABS
10.0000 mg | ORAL_TABLET | Freq: Once | ORAL | Status: AC
  Administered 2024-05-20: 10 mg via ORAL
  Filled 2024-05-20: qty 1

## 2024-05-20 MED ORDER — PROCHLORPERAZINE MALEATE 10 MG PO TABS
10.0000 mg | ORAL_TABLET | Freq: Once | ORAL | Status: AC
  Administered 2024-05-20: 10 mg via ORAL
  Filled 2024-05-20: qty 1

## 2024-05-20 MED ORDER — SODIUM CHLORIDE 0.9 % IV SOLN: INTRAVENOUS | Status: DC

## 2024-05-20 MED ORDER — SODIUM CHLORIDE 0.9 % IV SOLN
3.0000 mg/kg | Freq: Once | INTRAVENOUS | Status: AC
  Administered 2024-05-20: 400 mg via INTRAVENOUS
  Filled 2024-05-20: qty 20

## 2024-05-20 NOTE — Patient Instructions (Signed)
 [facility] - A DEPT OF [facility]  Discharge Instructions: Thank you for choosing [facility] to provide your oncology and hematology care.   If you have a lab appointment with the Cancer Center, please go directly to the Cancer Center and check in at the registration area.   Wear comfortable clothing and clothing appropriate for easy access to any Portacath or PICC line.   We strive to give you quality time with your provider. You may need to reschedule your appointment if you arrive late (15 or more minutes).  Arriving late affects you and other patients whose appointments are after yours.  Also, if you miss three or more appointments without notifying the office, you may be dismissed from the clinic at the provider's discretion.      For prescription refill requests, have your pharmacy contact our office and allow 72 hours for refills to be completed.    Today you received the following chemotherapy and/or immunotherapy agents: ado-trastuzumab emtansine  (KADCYLA )    To help prevent nausea and vomiting after your treatment, we encourage you to take your nausea medication as directed.  BELOW ARE SYMPTOMS THAT SHOULD BE REPORTED IMMEDIATELY: *FEVER GREATER THAN 100.4 F (38 C) OR HIGHER *CHILLS OR SWEATING *NAUSEA AND VOMITING THAT IS NOT CONTROLLED WITH YOUR NAUSEA MEDICATION *UNUSUAL SHORTNESS OF BREATH *UNUSUAL BRUISING OR BLEEDING *URINARY PROBLEMS (pain or burning when urinating, or frequent urination) *BOWEL PROBLEMS (unusual diarrhea, constipation, pain near the anus) TENDERNESS IN MOUTH AND THROAT WITH OR WITHOUT PRESENCE OF ULCERS (sore throat, sores in mouth, or a toothache) UNUSUAL RASH, SWELLING OR PAIN  UNUSUAL VAGINAL DISCHARGE OR ITCHING   Items with * indicate a potential emergency and should be followed up as soon as possible or go to the Emergency Department if any problems should occur.  Please show the CHEMOTHERAPY  ALERT CARD or IMMUNOTHERAPY ALERT CARD at check-in to the Emergency Department and triage nurse.  Should you have questions after your visit or need to cancel or reschedule your appointment, please contact [facility] - A DEPT OF [facility]  Dept: [phone]  and follow the prompts.  Office hours are 8:00 a.m. to 4:30 p.m. Monday - Friday. Please note that voicemails left after 4:00 p.m. may not be returned until the following business day.  We are closed weekends and major holidays. You have access to a nurse at all times for urgent questions. Please call the main number to the clinic Dept: [phone] and follow the prompts.   For any non-urgent questions, you may also contact your provider using MyChart. We now offer e-Visits for anyone 33 and older to request care online for non-urgent symptoms. For details visit [email or web address].   Also download the MyChart app! Go to the app store, search MyChart, open the app, select [facility], and log in with your MyChart username and password.
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
## 2024-05-25 NOTE — Progress Notes (Unsigned)
 "  Diamond Marshall - 54 y.o. female MRN [id number]  Date of birth: 1971-01-05  Office Visit Note: Visit Date: 05/26/2024 PCP: Zollie Lowers, MD Referred by: Joane Artist RAMAN, MD  Subjective: No chief complaint on file.  HPI: Diamond Marshall is a pleasant 55 y.o. female who presents today for bilateral hand paresthesias consistent with ulnar nerve distribution.  Symptoms consistent with cubital tunnel syndrome. She underwent bilateral cubital tunnel injection with Dr. Joane in September and continued Lyrica .  Was recommended elbow immobilizer/cubital tunnel brace. Did undergo Nerve conduction study with neurology***  Pertinent ROS were reviewed with the patient and found to be negative unless otherwise specified above in HPI.   Visit Reason: Duration of symptoms: Hand dominance: {RIGHT/LEFT:20294} Occupation: Diabetic: {yes/no:20286} Smoking: {yes/no:20286} Heart/Lung History: Blood Thinners:   Prior Testing/EMG: Injections (Date): Treatments: Prior Surgery:    Assessment & Plan: Visit Diagnoses: No diagnosis found.  Plan: ***  Follow-up: No follow-ups on file.   Meds & Orders: No orders of the defined types were placed in this encounter.  No orders of the defined types were placed in this encounter.    Procedures: No procedures performed      Clinical History: No specialty comments available.  She reports that she has never smoked. She has never used smokeless tobacco. No results for input(s): HGBA1C, LABURIC in the last 8760 hours.  Objective:   Vital Signs: LMP 10/13/2019   Physical Exam  Gen: Well-appearing, in no acute distress; non-toxic CV: Regular Rate. Well-perfused. Warm.  Resp: Breathing unlabored on room air; no wheezing. Psych: Fluid speech in conversation; appropriate affect; normal thought process  Ortho Exam - ***   Imaging: No results found.  Past Medical/Family/Surgical/Social History: Medications & Allergies reviewed per EMR, new  medications updated. 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   Breast cancer, stage 4 (HCC) 07/26/2023   Gastroenteritis 09/29/2022   Port-A-Cath in place 07/07/2022   Genetic testing 06/29/2022   Malignant neoplasm of upper-inner quadrant of left breast in female, estrogen receptor positive (HCC) 06/19/2022   Chronic pain of right knee 03/27/2022   Essential hypertension 03/27/2022   Positive self-administered antigen test for COVID-19 03/15/2021   Prediabetes 07/22/2019   Anxiety and depression 03/15/2016   Vitamin D  deficiency 03/15/2016   Metabolic syndrome X 08/07/2013   Elevated random blood glucose level 08/07/2013   Hypertriglyceridemia 08/07/2013   ADD (attention deficit disorder) 07/24/2013    hyperlipidemia 10/09/2012   Morbid obesity (HCC) 10/09/2012   Hypothyroidism 10/09/2012   B12 deficiency 10/09/2012   Anemia, unspecified 02/12/2011   Dysthymic disorder 02/12/2011   Migraine headache 02/12/2011   Obsessive-compulsive disorder 02/12/2011   Tinnitus 02/12/2011   Past Medical History:  Diagnosis Date   Abdominal hernia    Anemia    b12 deficiency   Anxiety    Arthritis    B12 deficiency    Back pain    Cancer (HCC)    breast cancer   Depression    Family history of adverse reaction to anesthesia    mother vomits after anesthesia   Gallbladder problem    GERD (gastroesophageal reflux disease)    History of hiatal hernia    small per patient   Hypertension    Hypothyroidism  Joint pain    Knee pain    Palpitation    when iron is low   Pernicious anemia    SOB (shortness of breath)    Thyroid  disease    hypothyroidism   Vitamin D  deficiency    Family History  Problem Relation  Age of Onset   Hypertension Mother    Diabetes Mother    High Cholesterol Mother    Thyroid  disease Mother    Depression Mother    Anxiety disorder Mother    Obesity Mother    CVA Father        hemorrhagic   Ovarian cancer Maternal Aunt 22   Colon cancer Maternal Aunt    Breast cancer Cousin 48       maternal first cousin, reports negative genetic testing   Past Surgical History:  Procedure Laterality Date   BREAST BIOPSY Left 06/09/2022   US  LT BREAST BX W LOC DEV 1ST LESION IMG BX SPEC US  GUIDE 06/09/2022 [facility] MAMMOGRAPHY   CESAREAN SECTION  1999   Dilatation and currettagement     for miscarriage 18 years ago   PORTACATH PLACEMENT Right 07/05/2022   Procedure: INSERTION PORT-A-CATH;  Surgeon: [name], MD;  Location: [facility] OR;  Service: General;  Laterality: Right;   SIMPLE MASTECTOMY WITH AXILLARY SENTINEL NODE BIOPSY Bilateral 07/26/2023   Procedure: LEFT SKIN REDUCING MASTECTOMY, RIGHT SKIN SPARING, RISK REDUCING MASTECTOMY;  Surgeon: [name], MD;  Location: [facility] OR;  Service: General;  Laterality: Bilateral;  PEC BLOCK   Social History   Occupational History   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 [facility], Hand Surgery  "

## 2024-05-26 ENCOUNTER — Other Ambulatory Visit (INDEPENDENT_AMBULATORY_CARE_PROVIDER_SITE_OTHER)

## 2024-05-26 ENCOUNTER — Ambulatory Visit (INDEPENDENT_AMBULATORY_CARE_PROVIDER_SITE_OTHER): Admitting: Orthopedic Surgery

## 2024-05-26 DIAGNOSIS — G5623 Lesion of ulnar nerve, bilateral upper limbs: Secondary | ICD-10-CM | POA: Diagnosis not present

## 2024-05-26 DIAGNOSIS — G5622 Lesion of ulnar nerve, left upper limb: Secondary | ICD-10-CM

## 2024-05-26 DIAGNOSIS — G5621 Lesion of ulnar nerve, right upper limb: Secondary | ICD-10-CM | POA: Diagnosis not present

## 2024-06-03 ENCOUNTER — Other Ambulatory Visit: Payer: Self-pay

## 2024-06-03 ENCOUNTER — Encounter: Payer: Self-pay | Admitting: Hematology and Oncology

## 2024-06-03 ENCOUNTER — Other Ambulatory Visit: Payer: Self-pay | Admitting: Hematology and Oncology

## 2024-06-03 MED ORDER — NYSTATIN 100000 UNIT/GM EX CREA: 1.0000 | TOPICAL_CREAM | Freq: Two times a day (BID) | CUTANEOUS | 0 refills | Status: DC

## 2024-06-10 ENCOUNTER — Inpatient Hospital Stay

## 2024-06-10 ENCOUNTER — Inpatient Hospital Stay: Attending: Hematology and Oncology

## 2024-06-10 ENCOUNTER — Inpatient Hospital Stay: Attending: Hematology and Oncology | Admitting: Hematology and Oncology

## 2024-06-10 ENCOUNTER — Telehealth: Payer: Self-pay | Admitting: Physical Medicine and Rehabilitation

## 2024-06-10 VITALS — BP 114/44 | HR 84 | Temp 97.7°F | Resp 17 | Wt 278.2 lb

## 2024-06-10 DIAGNOSIS — Z1721 Progesterone receptor positive status: Secondary | ICD-10-CM | POA: Diagnosis not present

## 2024-06-10 DIAGNOSIS — Z170 Estrogen receptor positive status [ER+]: Secondary | ICD-10-CM | POA: Insufficient documentation

## 2024-06-10 DIAGNOSIS — L304 Erythema intertrigo: Secondary | ICD-10-CM

## 2024-06-10 DIAGNOSIS — Z79899 Other long term (current) drug therapy: Secondary | ICD-10-CM | POA: Diagnosis not present

## 2024-06-10 DIAGNOSIS — Z5112 Encounter for antineoplastic immunotherapy: Secondary | ICD-10-CM | POA: Insufficient documentation

## 2024-06-10 DIAGNOSIS — R5383 Other fatigue: Secondary | ICD-10-CM | POA: Insufficient documentation

## 2024-06-10 DIAGNOSIS — Z803 Family history of malignant neoplasm of breast: Secondary | ICD-10-CM | POA: Diagnosis not present

## 2024-06-10 DIAGNOSIS — B372 Candidiasis of skin and nail: Secondary | ICD-10-CM | POA: Insufficient documentation

## 2024-06-10 DIAGNOSIS — C50212 Malignant neoplasm of upper-inner quadrant of left female breast: Secondary | ICD-10-CM | POA: Diagnosis not present

## 2024-06-10 DIAGNOSIS — Z1731 Human epidermal growth factor receptor 2 positive status: Secondary | ICD-10-CM | POA: Insufficient documentation

## 2024-06-10 LAB — CBC WITH DIFFERENTIAL (CANCER CENTER ONLY)
Abs Immature Granulocytes: 0.03 K/uL (ref 0.00–0.07)
Basophils Absolute: 0.1 K/uL (ref 0.0–0.1)
Basophils Relative: 1 %
Eosinophils Absolute: 0.3 K/uL (ref 0.0–0.5)
Eosinophils Relative: 5 %
HCT: 38.9 % (ref 36.0–46.0)
Hemoglobin: 12.3 g/dL (ref 12.0–15.0)
Immature Granulocytes: 1 %
Lymphocytes Relative: 33 %
Lymphs Abs: 2.1 K/uL (ref 0.7–4.0)
MCH: 25.4 pg — ABNORMAL LOW (ref 26.0–34.0)
MCHC: 31.6 g/dL (ref 30.0–36.0)
MCV: 80.4 fL (ref 80.0–100.0)
Monocytes Absolute: 0.4 K/uL (ref 0.1–1.0)
Monocytes Relative: 6 %
Neutro Abs: 3.5 K/uL (ref 1.7–7.7)
Neutrophils Relative %: 54 %
Platelet Count: 132 K/uL — ABNORMAL LOW (ref 150–400)
RBC: 4.84 MIL/uL (ref 3.87–5.11)
RDW: 22.5 % — ABNORMAL HIGH (ref 11.5–15.5)
WBC Count: 6.5 K/uL (ref 4.0–10.5)
nRBC: 0 % (ref 0.0–0.2)

## 2024-06-10 LAB — CMP (CANCER CENTER ONLY)
ALT: 26 U/L (ref 0–44)
AST: 49 U/L — ABNORMAL HIGH (ref 15–41)
Albumin: 3.6 g/dL (ref 3.5–5.0)
Alkaline Phosphatase: 173 U/L — ABNORMAL HIGH (ref 38–126)
Anion gap: 11 (ref 5–15)
BUN: 8 mg/dL (ref 6–20)
CO2: 25 mmol/L (ref 22–32)
Calcium: 9.2 mg/dL (ref 8.9–10.3)
Chloride: 104 mmol/L (ref 98–111)
Creatinine: 0.69 mg/dL (ref 0.44–1.00)
GFR, Estimated: >60 mL/min
Glucose, Bld: 104 mg/dL — ABNORMAL HIGH (ref 70–99)
Potassium: 3.7 mmol/L (ref 3.5–5.1)
Sodium: 139 mmol/L (ref 135–145)
Total Bilirubin: 1.2 mg/dL (ref 0.0–1.2)
Total Protein: 7.8 g/dL (ref 6.5–8.1)

## 2024-06-10 LAB — T4, FREE: Free T4: 1.08 ng/dL (ref 0.80–2.00)

## 2024-06-10 LAB — TSH: TSH: 3.38 u[IU]/mL (ref 0.350–4.500)

## 2024-06-10 MED ORDER — FLUCONAZOLE 200 MG PO TABS: 200.0000 mg | ORAL_TABLET | Freq: Every day | ORAL | 1 refills | Status: AC

## 2024-06-10 MED ORDER — PROCHLORPERAZINE MALEATE 10 MG PO TABS: 10.0000 mg | ORAL_TABLET | Freq: Once | ORAL | Status: DC

## 2024-06-10 MED ORDER — SODIUM CHLORIDE 0.9 % IV SOLN
3.0000 mg/kg | Freq: Once | INTRAVENOUS | Status: AC
  Administered 2024-06-10: 400 mg via INTRAVENOUS
  Filled 2024-06-10: qty 20

## 2024-06-10 MED ORDER — ACETAMINOPHEN 325 MG PO TABS
650.0000 mg | ORAL_TABLET | Freq: Once | ORAL | Status: AC
  Administered 2024-06-10: 650 mg via ORAL
  Filled 2024-06-10: qty 2

## 2024-06-10 MED ORDER — SODIUM CHLORIDE 0.9 % IV SOLN: INTRAVENOUS | Status: DC

## 2024-06-10 MED ORDER — LORATADINE 10 MG PO TABS
10.0000 mg | ORAL_TABLET | Freq: Once | ORAL | Status: AC
  Administered 2024-06-10: 10 mg via ORAL
  Filled 2024-06-10: qty 1

## 2024-06-10 MED ORDER — NYSTATIN 100000 UNIT/GM EX POWD: 1.0000 | Freq: Three times a day (TID) | CUTANEOUS | 0 refills | Status: AC

## 2024-06-10 NOTE — Progress Notes (Signed)
 [facility] Cancer Follow up:    Diamond Lowers, MD [address] [place] [address]   DIAGNOSIS:  Cancer Staging  Malignant neoplasm of upper-inner quadrant of left breast in female, estrogen receptor positive (HCC) Staging form: Breast, AJCC 8th Edition - Clinical stage from 06/21/2022: Stage IV (cT3, cN3, cM1, G3, ER+, PR+, HER2+) - Signed by [name], MD on 09/17/2023 Stage prefix: Initial diagnosis Histologic grading system: 3 grade system Stage used in treatment planning: Yes National guidelines used in treatment planning: Yes Type of national guideline used in treatment planning: NCCN    SUMMARY OF ONCOLOGIC HISTORY: Oncology History  Malignant neoplasm of upper-inner quadrant of left breast in female, estrogen receptor positive (HCC)  06/06/2022 Mammogram   Diagnostic mammogram given palpable mass showed suspicious spiculated left breast mass with surrounding nodularity at 9 o'clock position.  This measures up to 3.1 cm sonographically however due to deep location and patient body habitus the mammographic measurement of 5.8 cm is felt to be more accurate.  At least 2 abnormal lymph nodes in the left axilla.  Possible abnormal palpable left supraclavicular lymph node.   06/06/2022 Breast US    Breast ultrasound confirmed these findings.   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  Prognostic showed ER 65% weak to strong staining, PR 15% weak to moderate staining, Ki-67 of 50% and HER2 positive by IHC at 3+    Genetic Testing   [facility] Custom Panel+RNA was Negative. Report date is 06/28/2022.   The Custom Hereditary Cancers Panel offered by [facility] includes sequencing and/or deletion duplication testing of the following 43 genes: APC, ATM, AXIN2, BAP1, BARD1, BMPR1A, BRCA1, BRCA2, BRIP1,  CDH1, CDK4, CDKN2A (p14ARF and p16INK4a only), CHEK2, CTNNA1, EPCAM (Deletion/duplication testing only), FH, GREM1 (promoter region duplication testing only), HOXB13, KIT, MBD4, MEN1, MLH1, MSH2, MSH3, MSH6, MUTYH, NF1, NHTL1, PALB2, PDGFRA, PMS2, POLD1, POLE, PTEN, RAD51C, RAD51D, SMAD4, SMARCA4. STK11, TP53, TSC1, TSC2, and VHL.    06/21/2022 Cancer Staging   Staging form: Breast, AJCC 8th Edition - Clinical stage from 06/21/2022: Stage IV (cT3, cN3, cM1, G3, ER+, PR+, HER2+) - Signed by [name], MD on 09/17/2023 Stage prefix: Initial diagnosis Histologic grading system: 3 grade system Stage used in treatment planning: Yes National guidelines used in treatment planning: Yes Type of national guideline used in treatment planning: NCCN   06/29/2022 PET scan   IMPRESSION: 1. Hypermetabolic left breast mass, compatible with primary breast malignancy. 2. Hypermetabolic left cervical, axillary, subpectoral, supraclavicular and prevascular mediastinal lymph nodes, compatible with nodal metastatic disease. 3. No evidence of metastatic disease in the abdomen or pelvis. 4. Aortic Atherosclerosis (ICD10-I70.0).     Electronically Signed   By: [name] M.D.   On: 06/29/2022 09:32   07/11/2022 - 01/16/2023 Chemotherapy   Patient is on Treatment Plan : BREAST DOCEtaxel  + Trastuzumab  + Pertuzumab  (THP) q21d x 8 cycles / Trastuzumab  + Pertuzumab  q21d x 4 cycles     02/01/2023 - 06/29/2023 Chemotherapy   Patient is on Treatment Plan : BREAST METASTATIC Fam-Trastuzumab Deruxtecan-nxki  (Enhertu ) (5.4) q21d     10/02/2023 -  Chemotherapy   Patient is on Treatment Plan : BREAST ADO-Trastuzumab Emtansine  (Kadcyla ) q21d       CURRENT THERAPY:Kadcyla   INTERVAL HISTORY: History of Present Illness Diamond Marshall is a 54 year old female with estrogen  receptor positive left breast cancer who presents for routine oncology follow-up and evaluation of persistent groin candidiasis.  She has no new  symptoms or concerns related to her malignancy. She described a transient episode of severe right upper quadrant abdominal pain two weeks ago, which she attributed to dietary indiscretion and known gallbladder intolerance to fatty foods; this pain has resolved. She denies ongoing abdominal discomfort, fever, or chills.  She has persistent groin candidiasis with erythema extending from the groin to the hip. Symptoms have worsened over the past two weeks despite liberal use of nystatin  cream. She has not used nystatin  powder or oral fluconazole . She has implemented non-pharmacologic measures including loose cotton underwear, showering at night, avoiding underwear overnight, and using cornstarch to maintain dryness. She denies systemic symptoms such as fever or chills.  She had a recent appointment for evaluation of hand symptoms, but nerve conduction studies were delayed due to equipment malfunction. She is amenable to rescheduling.  Rest of the pertinent 10 point ROS reviewed and neg.  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   Breast cancer, stage 4 (HCC) 07/26/2023   Gastroenteritis 09/29/2022   Port-A-Cath in place 07/07/2022   Genetic testing 06/29/2022   Malignant neoplasm of upper-inner quadrant of left breast in female, estrogen receptor positive (HCC) 06/19/2022   Chronic pain of right knee 03/27/2022   Essential hypertension 03/27/2022   Positive self-administered antigen test for COVID-19 03/15/2021   Prediabetes 07/22/2019   Anxiety and depression 03/15/2016   Vitamin D  deficiency 03/15/2016   Metabolic syndrome X 08/07/2013   Elevated random blood glucose level 08/07/2013   Hypertriglyceridemia 08/07/2013   ADD  (attention deficit disorder) 07/24/2013    hyperlipidemia 10/09/2012   Morbid obesity (HCC) 10/09/2012   Hypothyroidism 10/09/2012   B12 deficiency 10/09/2012   Anemia, unspecified 02/12/2011   Dysthymic disorder 02/12/2011   Migraine headache 02/12/2011   Obsessive-compulsive disorder 02/12/2011   Tinnitus 02/12/2011    is allergic to phentermine , bee venom, lactose intolerance (gi), peanut allergen powder-dnfp, topiramate , sulfa antibiotics, tape, and wound dressing adhesive.  MEDICAL HISTORY: Past Medical History:  Diagnosis Date   Abdominal hernia    Anemia    b12 deficiency   Anxiety    Arthritis    B12 deficiency    Back pain    Cancer (HCC)    breast cancer   Depression    Family history of adverse reaction to anesthesia    mother vomits after anesthesia   Gallbladder problem    GERD (gastroesophageal reflux disease)    History of hiatal hernia    small per patient   Hypertension    Hypothyroidism    Joint pain    Knee pain    Palpitation    when iron is low   Pernicious anemia    SOB (shortness of breath)    Thyroid  disease    hypothyroidism   Vitamin D  deficiency     SURGICAL HISTORY: Past Surgical History:  Procedure Laterality Date   BREAST BIOPSY Left 06/09/2022   US  LT BREAST BX W LOC DEV 1ST LESION IMG BX SPEC US  GUIDE 06/09/2022 [facility] MAMMOGRAPHY   CESAREAN SECTION  1999   Dilatation and currettagement     for miscarriage 18 years ago  PORTACATH PLACEMENT Right 07/05/2022   Procedure: INSERTION PORT-A-CATH;  Surgeon: [name], MD;  Location: [facility] OR;  Service: General;  Laterality: Right;   SIMPLE MASTECTOMY WITH AXILLARY SENTINEL NODE BIOPSY Bilateral 07/26/2023   Procedure: LEFT SKIN REDUCING MASTECTOMY, RIGHT SKIN SPARING, RISK REDUCING MASTECTOMY;  Surgeon: [name], MD;  Location: [facility] OR;  Service: General;  Laterality: Bilateral;  PEC BLOCK    SOCIAL HISTORY: Social History   Socioeconomic History   Marital status:  Married    Spouse name: Garrel   Number of children: Not on file   Years of education: Not on file   Highest education level: Not on file  Occupational History   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  Tobacco Use   Smoking status: Never   Smokeless tobacco: Never  Vaping Use   Vaping status: Never Used  Substance and Sexual Activity   Alcohol use: No   Drug use: No   Sexual activity: Yes    Birth control/protection: Pill  Other Topics Concern   Not on file  Social History Narrative   Not on file   Social Drivers of Health   Tobacco Use: Low Risk (04/18/2024)   Received from [facility]   Patient History    Smoking Tobacco Use: Never    Smokeless Tobacco Use: Never    Passive Exposure: Not on file  Financial Resource Strain: Not on file  Food Insecurity: No Food Insecurity (10/09/2023)   Hunger Vital Sign    Worried About Running Out of Food in the Last Year: Never true    Ran Out of Food in the Last Year: Never true  Transportation Needs: No Transportation Needs (10/09/2023)   PRAPARE - Administrator, Civil Service (Medical): No    Lack of Transportation (Non-Medical): No  Physical Activity: Not on file  Stress: Not on file  Social Connections: Moderately Integrated (07/26/2023)   Social Connection and Isolation Panel    Frequency of Communication with Friends and Family: Three times a week    Frequency of Social Gatherings with Friends and Family: Three times a week    Attends Religious Services: More than 4 times per year    Active Member of Clubs or Organizations: No    Attends Banker Meetings: Never    Marital Status: Married  Catering Manager Violence: Not At Risk (10/09/2023)   Humiliation, Afraid, Rape, and Kick questionnaire    Fear of Current or Ex-Partner: No    Emotionally Abused: No    Physically Abused: No    Sexually Abused: No  Depression (PHQ2-9): Low Risk (04/08/2024)   Depression (PHQ2-9)    PHQ-2 Score: 0  Recent  Concern: Depression (PHQ2-9) - Medium Risk (02/27/2024)   Depression (PHQ2-9)    PHQ-2 Score: 5  Alcohol Screen: Not on file  Housing: Low Risk (10/09/2023)   Housing Stability Vital Sign    Unable to Pay for Housing in the Last Year: No    Number of Times Moved in the Last Year: 0    Homeless in the Last Year: No  Utilities: Not At Risk (10/09/2023)   AHC Utilities    Threatened with loss of utilities: No  Health Literacy: Not on file    FAMILY HISTORY: Family History  Problem Relation Age of Onset   Hypertension Mother    Diabetes Mother    High Cholesterol Mother    Thyroid  disease Mother    Depression Mother    Anxiety disorder Mother  Obesity Mother    CVA Father        hemorrhagic   Ovarian cancer Maternal Aunt 34   Colon cancer Maternal Aunt    Breast cancer Cousin 53       maternal first cousin, reports negative genetic testing      PHYSICAL EXAMINATION    Physical Exam Constitutional:      General: She is not in acute distress.    Appearance: Normal appearance. She is not toxic-appearing.  HENT:     Head: Normocephalic and atraumatic.     Mouth/Throat:     Mouth: Mucous membranes are moist.     Pharynx: Oropharynx is clear. No oropharyngeal exudate or posterior oropharyngeal erythema.  Eyes:     General: No scleral icterus. Cardiovascular:     Rate and Rhythm: Normal rate and regular rhythm.     Pulses: Normal pulses.     Heart sounds: Normal heart sounds.  Pulmonary:     Effort: Pulmonary effort is normal.     Breath sounds: Normal breath sounds.  Abdominal:     General: Abdomen is flat. Bowel sounds are normal. There is no distension.     Palpations: Abdomen is soft.     Tenderness: There is no abdominal tenderness.  Musculoskeletal:        General: No swelling.     Cervical back: Neck supple.  Lymphadenopathy:     Cervical: No cervical adenopathy.  Skin:    General: Skin is warm and dry.     Findings: No rash.     Comments: Left groin  candida noted.  Neurological:     General: No focal deficit present.     Mental Status: She is alert.  Psychiatric:        Mood and Affect: Mood normal.        Behavior: Behavior normal.     LABORATORY DATA:  CBC    Component Value Date/Time   WBC 7.5 05/20/2024 1331   WBC 6.6 02/29/2024 1922   RBC 4.75 05/20/2024 1331   HGB 11.9 (L) 05/20/2024 1331   HGB 12.0 07/21/2021 1511   HCT 37.7 05/20/2024 1331   HCT 37.6 07/21/2021 1511   PLT 126 (L) 05/20/2024 1331   PLT 359 07/21/2021 1511   MCV 79.4 (L) 05/20/2024 1331   MCV 76 (L) 07/21/2021 1511   MCH 25.1 (L) 05/20/2024 1331   MCHC 31.6 05/20/2024 1331   RDW 25.3 (H) 05/20/2024 1331   RDW 15.7 (H) 07/21/2021 1511   LYMPHSABS 2.2 05/20/2024 1331   LYMPHSABS 1.9 07/21/2021 1511   MONOABS 0.5 05/20/2024 1331   EOSABS 0.4 05/20/2024 1331   EOSABS 0.2 07/21/2021 1511   BASOSABS 0.1 05/20/2024 1331   BASOSABS 0.1 07/21/2021 1511    CMP     Component Value Date/Time   NA 140 05/20/2024 1331   NA 139 03/28/2022 0811   K 3.6 05/20/2024 1331   CL 105 05/20/2024 1331   CO2 27 05/20/2024 1331   GLUCOSE 92 05/20/2024 1331   BUN 8 05/20/2024 1331   BUN 14 03/28/2022 0811   CREATININE 0.64 05/20/2024 1331   CREATININE 0.74 10/09/2012 1537   CALCIUM 9.1 05/20/2024 1331   PROT 7.9 05/20/2024 1331   PROT 7.1 03/28/2022 0811   ALBUMIN 3.5 05/20/2024 1331   ALBUMIN 4.2 03/28/2022 0811   AST 50 (H) 05/20/2024 1331   ALT 24 05/20/2024 1331   ALKPHOS 176 (H) 05/20/2024 1331   BILITOT 1.0 05/20/2024 1331  GFRNONAA >60 05/20/2024 1331   GFRNONAA >89 10/09/2012 1537   GFRAA 96 05/18/2020 1442   GFRAA >89 10/09/2012 1537     ASSESSMENT and THERAPY PLAN:   Assessment & Plan Metastatic breast cancer, left upper-inner quadrant Currently on Kadcyla  with fatigue as the only significant adverse reaction.  No evidence of active disease. - Continue routine surveillance for breast cancer recurrence every three months with  guardant reveal. - continue imaging every 3-6 months, ordered.  Candidiasis of groin Persistent groin candidiasis with erythema, refractory to topical nystatin , requires systemic antifungal therapy. - Prescribed oral fluconazole  daily for three days. - Prescribed nystatin  powder for dryness. - Educated on non-pharmacologic measures: loose cotton underwear, nightly showering, no underwear at night  All questions were answered. The patient knows to call the clinic with any problems, questions or concerns. We can certainly see the patient much sooner if necessary.  Total encounter time:30 minutes*in face-to-face visit time, chart review, lab review, care coordination, order entry, and documentation of the encounter time.   *Total Encounter Time as defined by the Centers for Medicare and Medicaid Services includes, in addition to the face-to-face time of a patient visit (documented in the note above) non-face-to-face time: obtaining and reviewing outside history, ordering and reviewing medications, tests or procedures, care coordination (communications with other health care professionals or caregivers) and documentation in the medical record.

## 2024-06-10 NOTE — Patient Instructions (Signed)
 [facility] - A DEPT OF [facility]  Discharge Instructions: Thank you for choosing [facility] to provide your oncology and hematology care.   If you have a lab appointment with the Cancer Center, please go directly to the Cancer Center and check in at the registration area.   Wear comfortable clothing and clothing appropriate for easy access to any Portacath or PICC line.   We strive to give you quality time with your provider. You may need to reschedule your appointment if you arrive late (15 or more minutes).  Arriving late affects you and other patients whose appointments are after yours.  Also, if you miss three or more appointments without notifying the office, you may be dismissed from the clinic at the provider's discretion.      For prescription refill requests, have your pharmacy contact our office and allow 72 hours for refills to be completed.    Today you received the following chemotherapy and/or immunotherapy agents: ado-trastuzumab emtansine  (KADCYLA )    To help prevent nausea and vomiting after your treatment, we encourage you to take your nausea medication as directed.  BELOW ARE SYMPTOMS THAT SHOULD BE REPORTED IMMEDIATELY: *FEVER GREATER THAN 100.4 F (38 C) OR HIGHER *CHILLS OR SWEATING *NAUSEA AND VOMITING THAT IS NOT CONTROLLED WITH YOUR NAUSEA MEDICATION *UNUSUAL SHORTNESS OF BREATH *UNUSUAL BRUISING OR BLEEDING *URINARY PROBLEMS (pain or burning when urinating, or frequent urination) *BOWEL PROBLEMS (unusual diarrhea, constipation, pain near the anus) TENDERNESS IN MOUTH AND THROAT WITH OR WITHOUT PRESENCE OF ULCERS (sore throat, sores in mouth, or a toothache) UNUSUAL RASH, SWELLING OR PAIN  UNUSUAL VAGINAL DISCHARGE OR ITCHING   Items with * indicate a potential emergency and should be followed up as soon as possible or go to the Emergency Department if any problems should occur.  Please show the CHEMOTHERAPY  ALERT CARD or IMMUNOTHERAPY ALERT CARD at check-in to the Emergency Department and triage nurse.  Should you have questions after your visit or need to cancel or reschedule your appointment, please contact [facility] - A DEPT OF [facility]  Dept: [phone]  and follow the prompts.  Office hours are 8:00 a.m. to 4:30 p.m. Monday - Friday. Please note that voicemails left after 4:00 p.m. may not be returned until the following business day.  We are closed weekends and major holidays. You have access to a nurse at all times for urgent questions. Please call the main number to the clinic Dept: [phone] and follow the prompts.   For any non-urgent questions, you may also contact your provider using MyChart. We now offer e-Visits for anyone 33 and older to request care online for non-urgent symptoms. For details visit [email or web address].   Also download the MyChart app! Go to the app store, search MyChart, open the app, select [facility], and log in with your MyChart username and password.

## 2024-06-10 NOTE — Telephone Encounter (Signed)
 Pt called about nerve study testing. Please call pt with an update when machine will be fixed. Pt number is [phone].

## 2024-06-13 ENCOUNTER — Telehealth: Payer: Self-pay

## 2024-06-13 NOTE — Telephone Encounter (Signed)
 The patient requested an early refill of her Lyrica  prescription, which is not due until 06/16/24. She is asking for it to be filled ahead of schedule due to the expected weather conditions.

## 2024-06-14 ENCOUNTER — Telehealth: Payer: Self-pay | Admitting: Hematology and Oncology

## 2024-06-14 ENCOUNTER — Other Ambulatory Visit: Payer: Self-pay

## 2024-06-14 NOTE — Telephone Encounter (Signed)
 I spoke with patient and she is aware of all upcoming appointments on 07/01/2024, 07/02/2024, and 07/23/3034.

## 2024-06-16 ENCOUNTER — Other Ambulatory Visit: Payer: Self-pay

## 2024-06-17 ENCOUNTER — Encounter: Payer: Self-pay | Admitting: Adult Health

## 2024-06-18 ENCOUNTER — Ambulatory Visit: Payer: Self-pay | Admitting: Hematology and Oncology

## 2024-06-18 LAB — GUARDANT REVEAL

## 2024-06-19 ENCOUNTER — Other Ambulatory Visit: Payer: Self-pay

## 2024-06-19 ENCOUNTER — Telehealth: Payer: Self-pay

## 2024-06-19 ENCOUNTER — Encounter: Payer: Self-pay | Admitting: Hematology and Oncology

## 2024-06-19 NOTE — Progress Notes (Signed)
 Assessment and Plan:   Assessment & Plan Nose irritation Nasal lesions with possible staphylococcal colonization Nasal lesions with intermittent epistaxis. Concern for staphylococcal colonization. Dry air may contribute to lesions. - Ordered nasal swab for MRSA testing. - Prescribed mupirocin ointment for nasal application. - Advised use of humidifier. - Instructed to apply mupirocin with a Q-tip around nasal lesions. - Advised to monitor for increased redness or swelling and follow up if symptoms worsen. Orders:   MRSA Screen   mupirocin (BACTROBAN) 2 % ointment; Apply topically two (2) times a day for 7 days.     Patient/patient's family verbalizes understanding and is in agreement with plan at this time.  The following portions of the patient's history were reviewed and updated as appropriate: allergies, current medications, past family history, past medical history, past social history, past surgical history and problem list.   Subjective:   Patient ID: Diamond Marshall is a 54 y.o. female Chief Complaint  Patient presents with   Skin Lesions    Patient sts in a month she has been having sores and blood clots in her nose. Her Dr. Loretha her oncologist want her to be tested for staph and mrsa in her nose. Last time she had a issue with her nose was 2 days ago.      History of Present Illness Diamond Marshall is a 54 year old female who presents with sores and blood clots in her nose.She has had nasal sores and small blood clots for about one month, with the last mild epistaxis episode two days ago. She frequently touches her nose and thinks this worsens the bleeding. Bleeding is not heavy, and she denies other bleeding problems such as easy bruising. She has tried Neosporin and Vaseline. Her home is gas heated and very dry, and she recently started using a humidifier.  She presented due to her oncologist telling her she needed to be swabbed for staph/MRSA in her nose.     ROS:  Negative unless otherwise noted in HPI.  Objective:   Vital Signs:  BP 138/82   Pulse 94   Temp 36.9 C (98.4 F) (Oral)   Resp 20   Ht 157.5 cm (5' 2)   Wt (!) 125.6 kg (277 lb)   SpO2 94%   BMI 50.66 kg/m   Body mass index is 50.66 kg/m. No LMP recorded. Patient is postmenopausal.  No results found for this visit on 06/19/24.   Physical Exam Vitals and nursing note reviewed.  Constitutional:      General: She is not in acute distress.    Appearance: Normal appearance. She is normal weight. She is not ill-appearing or toxic-appearing.  HENT:     Head: Normocephalic.     Right Ear: Tympanic membrane, ear canal and external ear normal.     Left Ear: Tympanic membrane, ear canal and external ear normal.     Nose: Nose normal.     Right Nostril: No septal hematoma.     Left Nostril: No septal hematoma.     Mouth/Throat:     Mouth: Mucous membranes are moist.     Pharynx: Oropharynx is clear.  Eyes:     Extraocular Movements: Extraocular movements intact.     Conjunctiva/sclera: Conjunctivae normal.     Pupils: Pupils are equal, round, and reactive to light.  Cardiovascular:     Rate and Rhythm: Normal rate and regular rhythm.     Heart sounds: Normal heart sounds.  Pulmonary:  Effort: Pulmonary effort is normal. No respiratory distress.     Breath sounds: Normal breath sounds.  Abdominal:     General: Abdomen is flat.     Palpations: Abdomen is soft.  Musculoskeletal:        General: Normal range of motion.     Cervical back: Normal range of motion.  Skin:    General: Skin is warm and dry.  Neurological:     General: No focal deficit present.     Mental Status: She is alert and oriented to person, place, and time. Mental status is at baseline.  Psychiatric:        Mood and Affect: Mood normal.        Behavior: Behavior normal.        Thought Content: Thought content normal.        Judgment: Judgment normal.     Follow-up as Needed  and Follow-up with  PCP  The use of abridge was used to help in the completion of this note. Patient was educated and verbalized consent of its use.   Disposition Upon Discharge:  1.  Routine symptom specific, illness specific and/or disease specific instructions were discussed with the patient and/or caregiver at length.  2.  The differential diagnosis for the patient's specific symptoms were reviewed and discussed with the patient/caregiver at length; the patient/caregiver expressed understanding of all discussed and their relevant questions were all satisfactorily answered.  3.  Return to care should the presenting symptoms recur, persist, or worsen in any way; or in the alternative, if new symptoms or complaints develop.  4.  The patient and any family present were given verbal and/or written discharge instructions as clinically indicated and appropriate.  5.  Continue OTC medications as needed and tolerated for control of the currently reported symptoms, if no conflict with the currently prescribed medications.

## 2024-06-19 NOTE — Telephone Encounter (Signed)
 Per MD pt notified via mychart of her negative guardant reveal result.

## 2024-06-19 NOTE — Telephone Encounter (Signed)
 Called pt per MyChart message to offer smc visit at [facility] for tomorrow at 12 PM. She declined d/t work. Advised pt she should go to urgent care for swab to see if it is MRSA. Pt provided with our fax number so they can fax results.

## 2024-06-20 ENCOUNTER — Other Ambulatory Visit: Payer: Self-pay | Admitting: Hematology and Oncology

## 2024-06-20 DIAGNOSIS — E876 Hypokalemia: Secondary | ICD-10-CM

## 2024-06-22 ENCOUNTER — Other Ambulatory Visit: Payer: Self-pay

## 2024-06-23 ENCOUNTER — Other Ambulatory Visit: Payer: Self-pay

## 2024-06-23 ENCOUNTER — Emergency Department (HOSPITAL_COMMUNITY)
Admission: EM | Admit: 2024-06-23 | Discharge: 2024-06-23 | Disposition: A | Discharge status: Home / Self Care | Attending: Emergency Medicine | Admitting: Emergency Medicine

## 2024-06-23 ENCOUNTER — Emergency Department (HOSPITAL_COMMUNITY)

## 2024-06-23 ENCOUNTER — Encounter (HOSPITAL_COMMUNITY): Payer: Self-pay

## 2024-06-23 DIAGNOSIS — M533 Sacrococcygeal disorders, not elsewhere classified: Secondary | ICD-10-CM | POA: Insufficient documentation

## 2024-06-23 DIAGNOSIS — W000XXA Fall on same level due to ice and snow, initial encounter: Secondary | ICD-10-CM | POA: Insufficient documentation

## 2024-06-23 DIAGNOSIS — Z91010 Allergy to peanuts: Secondary | ICD-10-CM | POA: Insufficient documentation

## 2024-06-23 DIAGNOSIS — W19XXXA Unspecified fall, initial encounter: Secondary | ICD-10-CM

## 2024-06-23 DIAGNOSIS — Z853 Personal history of malignant neoplasm of breast: Secondary | ICD-10-CM | POA: Insufficient documentation

## 2024-06-23 DIAGNOSIS — M25551 Pain in right hip: Secondary | ICD-10-CM | POA: Insufficient documentation

## 2024-06-23 DIAGNOSIS — E039 Hypothyroidism, unspecified: Secondary | ICD-10-CM | POA: Insufficient documentation

## 2024-06-23 MED ORDER — OXYCODONE-ACETAMINOPHEN 5-325 MG PO TABS
1.0000 | ORAL_TABLET | Freq: Once | ORAL | Status: AC
  Administered 2024-06-23: 1 via ORAL
  Filled 2024-06-23: qty 1

## 2024-06-23 NOTE — ED Triage Notes (Signed)
 Slipped and fell  around 2 pm and landed on butt and right side. She said that she may have landed on ice because the edge of the ice felt like it went between the buttocks.  So she is having the most pain in the buttocks, right hip and right leg.

## 2024-06-23 NOTE — Discharge Instructions (Addendum)
 Thank for letting us  evaluate you today.  Your x-ray of your pelvis and tailbone are negative for fracture.  Please take Tylenol , ibuprofen  at home for pain.  Return to emerged from if you experience urinary incontinence, loss of sensation in genital region, inability to walk, worsening symptoms

## 2024-06-25 ENCOUNTER — Ambulatory Visit: Admitting: Physical Medicine and Rehabilitation

## 2024-06-25 DIAGNOSIS — R202 Paresthesia of skin: Secondary | ICD-10-CM

## 2024-06-25 NOTE — Progress Notes (Unsigned)
 Pain Scale   Average Pain 5 Patient advising she has chronic bilateral hand pain, numbness, tingling and weakness Patient is Right hand dominate.        +Driver, -BT, -Dye Allergies.

## 2024-06-25 NOTE — Progress Notes (Unsigned)
 "  Diamond Marshall - 54 y.o. female MRN [id number]  Date of birth: [birth year]  Office Visit Note: Visit Date: 06/25/2024 PCP: Zollie Lowers, MD Referred by: Arlinda Buster, MD  Subjective: Chief Complaint  Patient presents with   Right Hand - Pain, Numbness, Weakness   Left Hand - Pain, Numbness, Weakness   HPI: Diamond Marshall is a 54 y.o. female who comes in today at the request of Dr. Anshul Agarwala for evaluation and management of chronic, worsening and severe pain, numbness and tingling in the Bilateral upper extremities.  Patient is Right hand dominant.  bilateral hand paresthesias consistent with ulnar nerve distribution.  Symptoms consistent with cubital tunnel syndrome. She underwent bilateral cubital tunnel injection with Dr. Joane in September and continued Lyrica .  Was recommended elbow immobilizer/cubital tunnel brace.    Pertinent ROS were reviewed with the patient and found to be negative unless otherwise specified above in HPI.    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        Review of Systems  Musculoskeletal:  Positive for joint pain.  Neurological:  Positive for tingling and focal weakness.  All other systems reviewed and are negative.  Otherwise per HPI.  Assessment & Plan: Visit Diagnoses:    ICD-10-CM   1. Paresthesia of skin  R20.2 NCV with EMG (electromyography)       Plan: No additional findings.   Meds & Orders: No orders of the defined types were placed in this encounter.   Orders Placed This Encounter  Procedures   NCV with EMG (electromyography)    Follow-up: No follow-ups on file.   Procedures: No procedures performed      Clinical History: No specialty comments available.   She reports that she has never smoked. She has never used smokeless tobacco. No results for input(s):  HGBA1C, LABURIC in the last 8760 hours.  Objective:  VS:  HT:    WT:   BMI:     BP:   HR: bpm  TEMP: ( )  RESP:  Physical Exam Vitals and nursing note reviewed.  Constitutional:      General: She is not in acute distress.    Appearance: Normal appearance. She is well-developed. She is obese. She is not ill-appearing.  HENT:     Head: Normocephalic and atraumatic.  Eyes:     Conjunctiva/sclera: Conjunctivae normal.     Pupils: Pupils are equal, round, and reactive to light.  Cardiovascular:     Rate and Rhythm: Normal rate.     Pulses: Normal pulses.  Pulmonary:     Effort: Pulmonary effort is normal.  Musculoskeletal:        General: No swelling, tenderness or deformity.     Right lower leg: No edema.     Left lower leg: No edema.     Comments: Inspection reveals no atrophy of the bilateral APB or FDI or hand intrinsics. There is no swelling, color changes, allodynia or dystrophic changes. There is 5 out of 5 strength in the bilateral wrist extension, finger abduction and long finger flexion. There is intact sensation to light touch in all dermatomal and peripheral nerve distributions. There is a negative Froment's test bilaterally. There is a negative Tinel's test at the bilateral wrist and elbow. There is a negative Phalen's test bilaterally. There is a negative Hoffmann's test bilaterally.  Skin:    General: Skin is warm and dry.     Findings: No erythema or rash.  Neurological:     General: No focal deficit present.     Mental Status: She is alert and oriented to person, place, and time.     Sensory: No sensory deficit.     Motor: No weakness or abnormal muscle tone.     Coordination: Coordination normal.     Gait: Gait normal.  Psychiatric:        Mood and Affect: Mood normal.        Behavior: Behavior normal.     Ortho Exam  Imaging: No results found.  Past Medical/Family/Surgical/Social History: Medications & Allergies reviewed per EMR, new medications  updated. 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   Breast cancer, stage 4 (HCC) 07/26/2023   Gastroenteritis 09/29/2022   Port-A-Cath in place 07/07/2022   Genetic testing 06/29/2022   Malignant neoplasm of upper-inner quadrant of left breast in female, estrogen receptor positive (HCC) 06/19/2022   Chronic pain of right knee 03/27/2022   Essential hypertension 03/27/2022   Positive self-administered antigen test for COVID-19 03/15/2021   Prediabetes 07/22/2019   Anxiety and depression 03/15/2016   Vitamin D  deficiency 03/15/2016   Metabolic syndrome X 08/07/2013   Elevated random blood glucose level 08/07/2013   Hypertriglyceridemia 08/07/2013   ADD (attention deficit disorder) 07/24/2013    hyperlipidemia 10/09/2012   Morbid obesity (HCC) 10/09/2012   Hypothyroidism 10/09/2012   B12 deficiency 10/09/2012   Anemia, unspecified 02/12/2011   Dysthymic disorder 02/12/2011   Migraine headache 02/12/2011   Obsessive-compulsive disorder 02/12/2011   Tinnitus 02/12/2011   Past Medical History:  Diagnosis Date   Abdominal hernia    Anemia    b12 deficiency   Anxiety    Arthritis    B12 deficiency    Back pain    Cancer (HCC)    breast cancer   Depression    Family history of adverse reaction to anesthesia    mother vomits after anesthesia   Gallbladder problem    GERD (gastroesophageal reflux disease)    History of hiatal hernia    small per patient   Hypertension    Hypothyroidism    Joint pain    Knee pain    Palpitation    when iron is low   Pernicious anemia    SOB (shortness of breath)    Thyroid  disease    hypothyroidism   Vitamin D  deficiency     Family History  Problem Relation Age of Onset   Hypertension Mother    Diabetes Mother    High Cholesterol Mother    Thyroid  disease Mother    Depression Mother    Anxiety disorder Mother    Obesity Mother    CVA Father        hemorrhagic   Ovarian cancer Maternal Aunt 1   Colon cancer Maternal Aunt    Breast cancer Cousin 26       maternal first cousin, reports negative genetic testing   Past Surgical History:  Procedure Laterality Date   BREAST BIOPSY Left 06/09/2022   US  LT BREAST BX W LOC DEV 1ST LESION IMG BX SPEC US  GUIDE 06/09/2022 [facility]  CESAREAN SECTION  1999   Dilatation and currettagement     for miscarriage 18 years ago   PORTACATH PLACEMENT Right 07/05/2022   Procedure: INSERTION PORT-A-CATH;  Surgeon: [name], MD;  Location: [facility] OR;  Service: General;  Laterality: Right;   SIMPLE MASTECTOMY WITH AXILLARY SENTINEL NODE BIOPSY Bilateral 07/26/2023   Procedure: LEFT SKIN REDUCING MASTECTOMY, RIGHT SKIN SPARING, RISK REDUCING MASTECTOMY;  Surgeon: [name], MD;  Location: [facility] OR;  Service: General;  Laterality: Bilateral;  PEC BLOCK   Social History   Occupational History   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

## 2024-07-01 ENCOUNTER — Inpatient Hospital Stay: Attending: Hematology and Oncology

## 2024-07-01 ENCOUNTER — Inpatient Hospital Stay: Admitting: Physician Assistant

## 2024-07-02 ENCOUNTER — Inpatient Hospital Stay

## 2024-07-14 ENCOUNTER — Ambulatory Visit: Admitting: Orthopedic Surgery

## 2024-07-15 ENCOUNTER — Ambulatory Visit (HOSPITAL_COMMUNITY)

## 2024-07-22 ENCOUNTER — Inpatient Hospital Stay: Admitting: Hematology and Oncology

## 2024-07-22 ENCOUNTER — Inpatient Hospital Stay

## 2024-07-22 ENCOUNTER — Inpatient Hospital Stay: Attending: Hematology and Oncology
